# Patient Record
Sex: Female | Born: 1940
Health system: Southern US, Community
[De-identification: ages and names within clinical notes are randomized; demographics above are authoritative.]

## PROBLEM LIST (undated history)

## (undated) DIAGNOSIS — K219 Gastro-esophageal reflux disease without esophagitis: Secondary | ICD-10-CM

## (undated) DIAGNOSIS — Z8739 Personal history of other diseases of the musculoskeletal system and connective tissue: Secondary | ICD-10-CM

## (undated) DIAGNOSIS — M9909 Segmental and somatic dysfunction of abdomen and other regions: Secondary | ICD-10-CM

## (undated) DIAGNOSIS — M1990 Unspecified osteoarthritis, unspecified site: Secondary | ICD-10-CM

## (undated) DIAGNOSIS — E7800 Pure hypercholesterolemia, unspecified: Secondary | ICD-10-CM

## (undated) DIAGNOSIS — I10 Essential (primary) hypertension: Secondary | ICD-10-CM

## (undated) DIAGNOSIS — F32A Depression, unspecified: Secondary | ICD-10-CM

## (undated) DIAGNOSIS — M797 Fibromyalgia: Secondary | ICD-10-CM

## (undated) DIAGNOSIS — E119 Type 2 diabetes mellitus without complications: Secondary | ICD-10-CM

## (undated) DIAGNOSIS — T7840XA Allergy, unspecified, initial encounter: Secondary | ICD-10-CM

## (undated) DIAGNOSIS — N993 Prolapse of vaginal vault after hysterectomy: Secondary | ICD-10-CM

## (undated) DIAGNOSIS — J45909 Unspecified asthma, uncomplicated: Secondary | ICD-10-CM

## (undated) DIAGNOSIS — I2510 Atherosclerotic heart disease of native coronary artery without angina pectoris: Secondary | ICD-10-CM

## (undated) DIAGNOSIS — I872 Venous insufficiency (chronic) (peripheral): Secondary | ICD-10-CM

## (undated) DIAGNOSIS — D573 Sickle-cell trait: Secondary | ICD-10-CM

## (undated) DIAGNOSIS — F329 Major depressive disorder, single episode, unspecified: Secondary | ICD-10-CM

## (undated) DIAGNOSIS — R809 Proteinuria, unspecified: Secondary | ICD-10-CM

## (undated) DIAGNOSIS — R011 Cardiac murmur, unspecified: Secondary | ICD-10-CM

## (undated) DIAGNOSIS — F419 Anxiety disorder, unspecified: Secondary | ICD-10-CM

## (undated) DIAGNOSIS — H5440 Blindness, one eye, unspecified eye: Secondary | ICD-10-CM

## (undated) DIAGNOSIS — K5730 Diverticulosis of large intestine without perforation or abscess without bleeding: Secondary | ICD-10-CM

## (undated) DIAGNOSIS — Z91199 Patient's noncompliance with other medical treatment and regimen due to unspecified reason: Secondary | ICD-10-CM

## (undated) DIAGNOSIS — Z9119 Patient's noncompliance with other medical treatment and regimen: Secondary | ICD-10-CM

## (undated) DIAGNOSIS — I639 Cerebral infarction, unspecified: Secondary | ICD-10-CM

## (undated) HISTORY — DX: Unspecified asthma, uncomplicated: J45.909

## (undated) HISTORY — PX: ABDOMINAL HYSTERECTOMY: SHX81

## (undated) HISTORY — DX: Proteinuria, unspecified: R80.9

## (undated) HISTORY — DX: Sickle-cell trait: D57.3

## (undated) HISTORY — DX: Segmental and somatic dysfunction of abdomen and other regions: M99.09

## (undated) HISTORY — DX: Pure hypercholesterolemia, unspecified: E78.00

## (undated) HISTORY — PX: CARDIAC CATHETERIZATION: SHX172

## (undated) HISTORY — DX: Diverticulosis of large intestine without perforation or abscess without bleeding: K57.30

## (undated) HISTORY — DX: Gastro-esophageal reflux disease without esophagitis: K21.9

## (undated) HISTORY — DX: Anxiety disorder, unspecified: F41.9

## (undated) HISTORY — DX: Patient's noncompliance with other medical treatment and regimen: Z91.19

## (undated) HISTORY — PX: DILATION AND CURETTAGE OF UTERUS: SHX78

## (undated) HISTORY — DX: Essential (primary) hypertension: I10

## (undated) HISTORY — DX: Unspecified osteoarthritis, unspecified site: M19.90

## (undated) HISTORY — DX: Allergy, unspecified, initial encounter: T78.40XA

## (undated) HISTORY — DX: Patient's noncompliance with other medical treatment and regimen due to unspecified reason: Z91.199

## (undated) HISTORY — DX: Fibromyalgia: M79.7

## (undated) HISTORY — DX: Atherosclerotic heart disease of native coronary artery without angina pectoris: I25.10

## (undated) HISTORY — DX: Venous insufficiency (chronic) (peripheral): I87.2

## (undated) HISTORY — DX: Prolapse of vaginal vault after hysterectomy: N99.3

## (undated) HISTORY — PX: CORONARY ANGIOPLASTY: SHX604

---

## 2001-02-14 ENCOUNTER — Encounter: Admission: RE | Admit: 2001-02-14 | Discharge: 2001-03-02 | Payer: Self-pay | Admitting: Orthopedic Surgery

## 2001-11-15 ENCOUNTER — Ambulatory Visit (HOSPITAL_COMMUNITY): Admission: RE | Admit: 2001-11-15 | Discharge: 2001-11-15 | Payer: Self-pay | Admitting: Cardiology

## 2001-11-15 ENCOUNTER — Encounter: Payer: Self-pay | Admitting: Cardiology

## 2001-11-28 ENCOUNTER — Encounter: Payer: Self-pay | Admitting: Cardiology

## 2001-11-28 ENCOUNTER — Ambulatory Visit (HOSPITAL_COMMUNITY): Admission: RE | Admit: 2001-11-28 | Discharge: 2001-11-28 | Payer: Self-pay | Admitting: Cardiology

## 2002-08-01 ENCOUNTER — Other Ambulatory Visit: Admission: RE | Admit: 2002-08-01 | Discharge: 2002-08-01 | Payer: Self-pay | Admitting: Gynecology

## 2003-01-25 ENCOUNTER — Emergency Department (HOSPITAL_COMMUNITY): Admission: EM | Admit: 2003-01-25 | Discharge: 2003-01-25 | Payer: Self-pay | Admitting: Emergency Medicine

## 2003-09-30 ENCOUNTER — Other Ambulatory Visit: Admission: RE | Admit: 2003-09-30 | Discharge: 2003-09-30 | Payer: Self-pay | Admitting: Gynecology

## 2004-05-01 ENCOUNTER — Ambulatory Visit: Payer: Self-pay | Admitting: Pulmonary Disease

## 2004-08-06 ENCOUNTER — Encounter: Admission: RE | Admit: 2004-08-06 | Discharge: 2004-11-04 | Payer: Self-pay | Admitting: Pulmonary Disease

## 2004-10-05 ENCOUNTER — Other Ambulatory Visit: Admission: RE | Admit: 2004-10-05 | Discharge: 2004-10-05 | Payer: Self-pay | Admitting: Gynecology

## 2005-04-14 ENCOUNTER — Emergency Department (HOSPITAL_COMMUNITY): Admission: EM | Admit: 2005-04-14 | Discharge: 2005-04-14 | Payer: Self-pay | Admitting: Family Medicine

## 2005-12-28 ENCOUNTER — Ambulatory Visit: Payer: Self-pay | Admitting: Pulmonary Disease

## 2006-04-04 ENCOUNTER — Ambulatory Visit: Payer: Self-pay | Admitting: Pulmonary Disease

## 2006-06-20 ENCOUNTER — Ambulatory Visit: Payer: Self-pay | Admitting: Pulmonary Disease

## 2006-06-20 LAB — CONVERTED CEMR LAB
Bacteria, U Microscopic: NEGATIVE /hpf
Bilirubin Urine: NEGATIVE
Crystals: NEGATIVE
Ketones, ur: NEGATIVE mg/dL
Leukocytes, UA: NEGATIVE
Mucus, UA: NEGATIVE
Nitrite: NEGATIVE
Specific Gravity, Urine: >=1.03 (ref 1.000–1.03)
Total Protein, Urine: >=300 mg/dL — AB
Urine Glucose: NEGATIVE mg/dL
Urobilinogen, UA: 0.2 (ref 0.0–1.0)
pH: 6 (ref 5.0–8.0)

## 2006-07-06 ENCOUNTER — Ambulatory Visit: Payer: Self-pay | Admitting: Pulmonary Disease

## 2006-07-08 ENCOUNTER — Encounter: Payer: Self-pay | Admitting: Pulmonary Disease

## 2006-07-08 LAB — CONVERTED CEMR LAB: Protein, Ur: 2153 mg/24hr — ABNORMAL HIGH (ref 50–100)

## 2006-08-05 ENCOUNTER — Encounter: Admission: RE | Admit: 2006-08-05 | Discharge: 2006-08-05 | Payer: Self-pay | Admitting: Nephrology

## 2006-08-16 ENCOUNTER — Ambulatory Visit: Payer: Self-pay | Admitting: Pulmonary Disease

## 2006-08-26 ENCOUNTER — Ambulatory Visit: Payer: Self-pay | Admitting: Pulmonary Disease

## 2006-08-26 LAB — CONVERTED CEMR LAB
Albumin: 3.6 g/dL (ref 3.5–5.2)
BUN: 22 mg/dL (ref 6–23)
CO2: 29 meq/L (ref 19–32)
Calcium: 9.2 mg/dL (ref 8.4–10.5)
Chloride: 105 meq/L (ref 96–112)
Cholesterol: 164 mg/dL (ref 0–200)
Creatinine, Ser: 1 mg/dL (ref 0.4–1.2)
GFR calc non Af Amer: 59 mL/min
Hgb A1c MFr Bld: 6.3 % — ABNORMAL HIGH (ref 4.6–6.0)
Phosphorus: 4.2 mg/dL (ref 2.3–4.6)
Total CHOL/HDL Ratio: 3.6

## 2006-10-12 ENCOUNTER — Ambulatory Visit: Payer: Self-pay | Admitting: Pulmonary Disease

## 2006-10-27 ENCOUNTER — Emergency Department (HOSPITAL_COMMUNITY): Admission: EM | Admit: 2006-10-27 | Discharge: 2006-10-27 | Payer: Self-pay | Admitting: Family Medicine

## 2006-11-01 ENCOUNTER — Ambulatory Visit: Payer: Self-pay | Admitting: Pulmonary Disease

## 2006-12-01 ENCOUNTER — Ambulatory Visit: Payer: Self-pay | Admitting: Pulmonary Disease

## 2007-01-13 ENCOUNTER — Ambulatory Visit: Payer: Self-pay | Admitting: Pulmonary Disease

## 2007-05-04 DIAGNOSIS — M797 Fibromyalgia: Secondary | ICD-10-CM | POA: Insufficient documentation

## 2007-05-04 DIAGNOSIS — I872 Venous insufficiency (chronic) (peripheral): Secondary | ICD-10-CM | POA: Insufficient documentation

## 2007-05-04 DIAGNOSIS — I10 Essential (primary) hypertension: Secondary | ICD-10-CM | POA: Insufficient documentation

## 2007-05-04 DIAGNOSIS — K5900 Constipation, unspecified: Secondary | ICD-10-CM | POA: Insufficient documentation

## 2007-05-04 DIAGNOSIS — M1990 Unspecified osteoarthritis, unspecified site: Secondary | ICD-10-CM | POA: Insufficient documentation

## 2007-05-05 ENCOUNTER — Ambulatory Visit: Payer: Self-pay | Admitting: Pulmonary Disease

## 2007-06-07 ENCOUNTER — Telehealth (INDEPENDENT_AMBULATORY_CARE_PROVIDER_SITE_OTHER): Payer: Self-pay | Admitting: *Deleted

## 2007-06-09 ENCOUNTER — Ambulatory Visit: Payer: Self-pay | Admitting: Pulmonary Disease

## 2007-06-09 DIAGNOSIS — J069 Acute upper respiratory infection, unspecified: Secondary | ICD-10-CM | POA: Insufficient documentation

## 2007-06-30 ENCOUNTER — Telehealth: Payer: Self-pay | Admitting: Pulmonary Disease

## 2007-07-07 ENCOUNTER — Telehealth: Payer: Self-pay | Admitting: Pulmonary Disease

## 2007-07-11 ENCOUNTER — Ambulatory Visit: Payer: Self-pay | Admitting: Pulmonary Disease

## 2007-07-11 DIAGNOSIS — Z9119 Patient's noncompliance with other medical treatment and regimen: Secondary | ICD-10-CM | POA: Insufficient documentation

## 2007-07-11 DIAGNOSIS — D573 Sickle-cell trait: Secondary | ICD-10-CM | POA: Insufficient documentation

## 2007-07-11 DIAGNOSIS — Z91199 Patient's noncompliance with other medical treatment and regimen due to unspecified reason: Secondary | ICD-10-CM | POA: Insufficient documentation

## 2007-07-11 DIAGNOSIS — F411 Generalized anxiety disorder: Secondary | ICD-10-CM | POA: Insufficient documentation

## 2007-07-11 DIAGNOSIS — M999 Biomechanical lesion, unspecified: Secondary | ICD-10-CM | POA: Insufficient documentation

## 2007-07-11 DIAGNOSIS — K5730 Diverticulosis of large intestine without perforation or abscess without bleeding: Secondary | ICD-10-CM | POA: Insufficient documentation

## 2007-07-11 DIAGNOSIS — R809 Proteinuria, unspecified: Secondary | ICD-10-CM | POA: Insufficient documentation

## 2007-07-11 DIAGNOSIS — J209 Acute bronchitis, unspecified: Secondary | ICD-10-CM | POA: Insufficient documentation

## 2007-08-30 ENCOUNTER — Encounter: Payer: Self-pay | Admitting: Pulmonary Disease

## 2007-09-15 ENCOUNTER — Telehealth (INDEPENDENT_AMBULATORY_CARE_PROVIDER_SITE_OTHER): Payer: Self-pay | Admitting: *Deleted

## 2007-09-19 ENCOUNTER — Telehealth (INDEPENDENT_AMBULATORY_CARE_PROVIDER_SITE_OTHER): Payer: Self-pay | Admitting: *Deleted

## 2007-11-10 ENCOUNTER — Ambulatory Visit: Payer: Self-pay | Admitting: Pulmonary Disease

## 2008-04-01 ENCOUNTER — Telehealth (INDEPENDENT_AMBULATORY_CARE_PROVIDER_SITE_OTHER): Payer: Self-pay | Admitting: *Deleted

## 2008-04-02 ENCOUNTER — Ambulatory Visit: Payer: Self-pay | Admitting: Internal Medicine

## 2008-04-02 DIAGNOSIS — R42 Dizziness and giddiness: Secondary | ICD-10-CM | POA: Insufficient documentation

## 2008-04-03 ENCOUNTER — Encounter: Payer: Self-pay | Admitting: Pulmonary Disease

## 2008-04-03 ENCOUNTER — Telehealth (INDEPENDENT_AMBULATORY_CARE_PROVIDER_SITE_OTHER): Payer: Self-pay | Admitting: *Deleted

## 2008-04-03 LAB — CONVERTED CEMR LAB
ALT: 19 units/L (ref 0–35)
Albumin: 3.8 g/dL (ref 3.5–5.2)
Alkaline Phosphatase: 56 units/L (ref 39–117)
BUN: 24 mg/dL — ABNORMAL HIGH (ref 6–23)
Bilirubin, Direct: 0.1 mg/dL (ref 0.0–0.3)
CO2: 31 meq/L (ref 19–32)
Calcium: 9 mg/dL (ref 8.4–10.5)
Eosinophils Relative: 3.2 % (ref 0.0–5.0)
Glucose, Bld: 85 mg/dL (ref 70–99)
HCT: 41.3 % (ref 36.0–46.0)
Hemoglobin: 14.4 g/dL (ref 12.0–15.0)
Lymphocytes Relative: 33.2 % (ref 12.0–46.0)
Monocytes Relative: 11.6 % (ref 3.0–12.0)
Neutro Abs: 2.4 10*3/uL (ref 1.4–7.7)
Potassium: 4.2 meq/L (ref 3.5–5.1)
RDW: 15 % — ABNORMAL HIGH (ref 11.5–14.6)
Sodium: 141 meq/L (ref 135–145)
Total Protein: 7.4 g/dL (ref 6.0–8.3)
WBC: 4.5 10*3/uL (ref 4.5–10.5)

## 2008-05-13 ENCOUNTER — Ambulatory Visit: Payer: Self-pay | Admitting: Pulmonary Disease

## 2008-05-23 ENCOUNTER — Telehealth (INDEPENDENT_AMBULATORY_CARE_PROVIDER_SITE_OTHER): Payer: Self-pay | Admitting: *Deleted

## 2008-06-03 ENCOUNTER — Encounter: Payer: Self-pay | Admitting: Pulmonary Disease

## 2008-11-05 ENCOUNTER — Telehealth (INDEPENDENT_AMBULATORY_CARE_PROVIDER_SITE_OTHER): Payer: Self-pay | Admitting: *Deleted

## 2008-11-12 ENCOUNTER — Ambulatory Visit: Payer: Self-pay | Admitting: Pulmonary Disease

## 2008-11-28 ENCOUNTER — Telehealth: Payer: Self-pay | Admitting: Pulmonary Disease

## 2008-12-04 ENCOUNTER — Telehealth (INDEPENDENT_AMBULATORY_CARE_PROVIDER_SITE_OTHER): Payer: Self-pay | Admitting: *Deleted

## 2008-12-06 ENCOUNTER — Telehealth (INDEPENDENT_AMBULATORY_CARE_PROVIDER_SITE_OTHER): Payer: Self-pay | Admitting: *Deleted

## 2008-12-08 ENCOUNTER — Emergency Department (HOSPITAL_COMMUNITY): Admission: EM | Admit: 2008-12-08 | Discharge: 2008-12-08 | Payer: Self-pay | Admitting: Family Medicine

## 2008-12-31 ENCOUNTER — Encounter: Payer: Self-pay | Admitting: Pulmonary Disease

## 2009-01-17 ENCOUNTER — Encounter: Payer: Self-pay | Admitting: Pulmonary Disease

## 2009-01-27 ENCOUNTER — Encounter: Payer: Self-pay | Admitting: Pulmonary Disease

## 2009-02-05 ENCOUNTER — Telehealth: Payer: Self-pay | Admitting: Pulmonary Disease

## 2009-02-14 ENCOUNTER — Encounter: Payer: Self-pay | Admitting: Pulmonary Disease

## 2009-03-07 ENCOUNTER — Encounter: Payer: Self-pay | Admitting: Pulmonary Disease

## 2009-05-21 ENCOUNTER — Ambulatory Visit: Payer: Self-pay | Admitting: Pulmonary Disease

## 2009-05-21 DIAGNOSIS — B029 Zoster without complications: Secondary | ICD-10-CM | POA: Insufficient documentation

## 2009-05-22 DIAGNOSIS — E785 Hyperlipidemia, unspecified: Secondary | ICD-10-CM

## 2009-05-22 DIAGNOSIS — E1169 Type 2 diabetes mellitus with other specified complication: Secondary | ICD-10-CM | POA: Insufficient documentation

## 2009-05-22 DIAGNOSIS — N993 Prolapse of vaginal vault after hysterectomy: Secondary | ICD-10-CM | POA: Insufficient documentation

## 2009-07-04 ENCOUNTER — Telehealth: Payer: Self-pay | Admitting: Pulmonary Disease

## 2009-09-05 ENCOUNTER — Encounter: Payer: Self-pay | Admitting: Pulmonary Disease

## 2009-09-17 ENCOUNTER — Encounter: Payer: Self-pay | Admitting: Pulmonary Disease

## 2009-09-19 HISTORY — PX: ROBOTIC ASSISTED LAPAROSCOPIC SACROCOLPOPEXY: SHX5388

## 2009-09-25 ENCOUNTER — Observation Stay (HOSPITAL_COMMUNITY): Admission: RE | Admit: 2009-09-25 | Discharge: 2009-09-27 | Payer: Self-pay | Admitting: Urology

## 2009-10-14 ENCOUNTER — Encounter: Payer: Self-pay | Admitting: Pulmonary Disease

## 2009-11-19 ENCOUNTER — Ambulatory Visit: Payer: Self-pay | Admitting: Pulmonary Disease

## 2009-11-20 ENCOUNTER — Ambulatory Visit: Payer: Self-pay | Admitting: Pulmonary Disease

## 2009-11-23 LAB — CONVERTED CEMR LAB
Albumin: 4 g/dL (ref 3.5–5.2)
BUN: 29 mg/dL — ABNORMAL HIGH (ref 6–23)
Basophils Relative: 1.4 % (ref 0.0–3.0)
Calcium: 9.7 mg/dL (ref 8.4–10.5)
Eosinophils Relative: 4 % (ref 0.0–5.0)
GFR calc non Af Amer: 64.48 mL/min (ref >60)
Glucose, Bld: 97 mg/dL (ref 70–99)
HCT: 39.4 % (ref 36.0–46.0)
HDL: 45.1 mg/dL (ref >39.00)
Hemoglobin, Urine: NEGATIVE
Hemoglobin: 13.4 g/dL (ref 12.0–15.0)
Hgb A1c MFr Bld: 6.2 % (ref 4.6–6.5)
LDL Cholesterol: 106 mg/dL — ABNORMAL HIGH (ref 0–99)
Lymphs Abs: 1.6 10*3/uL (ref 0.7–4.0)
Monocytes Relative: 13.2 % — ABNORMAL HIGH (ref 3.0–12.0)
Neutro Abs: 2.1 10*3/uL (ref 1.4–7.7)
Platelets: 304 10*3/uL (ref 150.0–400.0)
RBC: 4.52 M/uL (ref 3.87–5.11)
TSH: 0.96 microintl units/mL (ref 0.35–5.50)
Total Bilirubin: 0.5 mg/dL (ref 0.3–1.2)
Total Protein, Urine: NEGATIVE mg/dL
Urine Glucose: NEGATIVE mg/dL
VLDL: 12 mg/dL (ref 0.0–40.0)
WBC: 4.6 10*3/uL (ref 4.5–10.5)

## 2009-11-24 ENCOUNTER — Encounter: Payer: Self-pay | Admitting: Pulmonary Disease

## 2010-01-26 ENCOUNTER — Encounter: Payer: Self-pay | Admitting: Pulmonary Disease

## 2010-03-04 ENCOUNTER — Encounter: Payer: Self-pay | Admitting: Pulmonary Disease

## 2010-05-05 ENCOUNTER — Telehealth (INDEPENDENT_AMBULATORY_CARE_PROVIDER_SITE_OTHER): Payer: Self-pay | Admitting: *Deleted

## 2010-06-21 HISTORY — PX: CATARACT EXTRACTION W/ INTRAOCULAR LENS  IMPLANT, BILATERAL: SHX1307

## 2010-07-10 ENCOUNTER — Encounter: Payer: Self-pay | Admitting: Pulmonary Disease

## 2010-07-23 NOTE — Letter (Signed)
Summary: [facility]  [facility]   Imported By: Sherian Rein 09/30/2009 07:37:00  _____________________________________________________________________  External Attachment:    Type:   Image     Comment:   External Document

## 2010-07-23 NOTE — Letter (Signed)
Summary: [facility]   Imported By: Sherian Rein 11/27/2009 09:32:18  _____________________________________________________________________  External Attachment:    Type:   Image     Comment:   External Document

## 2010-07-23 NOTE — Letter (Signed)
Summary: [facility]   Imported By: Sherian Rein 10/21/2009 12:25:27  _____________________________________________________________________  External Attachment:    Type:   Image     Comment:   External Document

## 2010-07-23 NOTE — Assessment & Plan Note (Signed)
Summary: 6 months/apc   CC:  6 month ROV & review of mult medical problems....  History of Present Illness: 70 y/o BF here for a follow up visit... she has mult med problems as noted...  She has HBP treated by [facility] w/ ATENOLOL 50mg /d and BENICAR/HCT 40-25 daily... states her control has been good... he did her lab work and he wanted her to start CRESTOR which she says she takes only every once in awhile- true to her history of chronic non-compliance w/ medical therapy.  ~  11/09: she was seen 04/02/08 by TP w/ dizziness and given Meclizine for Prn use... she states that it "knocks me out" and she is c/o fatigue/ tired/ malaise- noting in addition "I'm stressed".   ~  Nov 12, 2008:  called recently w/ rash- ?poison ivy per pharm- we called in Medrol & Lidex-E, now improved... she saw "bladder specialist" ie- her GYN, DrRoberts removed pessary and may need surg... she states she's afraid of meds since she has "so many reactions to things" but wants to use Cholester-off for her lipids- I encouraged her to talk w/ DrHarwani & have FLP f/u... c/o constipation and gas...   ~  May 21, 2009:  presents w/ pain left side of buttock x 2-3 weeks w/ "cyst"... exam shows shingles left S2 distrib- mild rash, mild-mod pain by her account... we discussed Rx w/ Acyclovir (for cost reasons) + Medrol Dosepak, and OTC pain meds Prn (all symptoms resolved)...  she notes that her BP has been well controlled- Chol still monitored by [facility], we don't have labs and she doesn't know her numbers...   ~  November 19, 2009:  she is c/o some shoulder & arm pain- eval by Ortho DrHilts ?arthritis ?rotator cuff & she will f/u w/ him... rec> try MOBIC 7.5mg /d in the interim... she had bladder surg> robotic sacrocolpopexy 4/11 by DrMacDiarmid- improved symptomatically... still sees [facility] but wants fasting labs here today & we will send copies.    Current Problem List:  HYPERTENSION (ICD-401.9) - on BENICAR/ HCT 40-25  daily + ATENOLOL 50mg - 1/2 tab/d... Mod HBP that is easy enough to control when she takes her meds regularly... DrHarwani has assumed management of her BP... BP today= 122/74, & denies HA, fatigue, visual changes, CP, palipit, dizziness, syncope, dyspnea, edema, etc...   ~  CXR 1/09 showed clear lungs, sl cardiomegaly w/o CHF...  ~  she continues to have regular check ups w/ DrHarwani...  VENOUS INSUFFICIENCY (ICD-459.81) - on low sodium diet, elevation, support hose & the Hct.  HYPERCHOLESTEROLEMIA, MILD (ICD-272.0 - followed & treated by [facility] on diet alone, we don't have his labs or notes.  ~  FLP here 11/04 showed TChol 214, TG 122, HDL 44, LDL 145  ~  FLP here 3/08 showed TChol 164, TG 59, HDL 45, LDL 107  ~  FLP here 6/11 showed TChol 163, Tg 60, HDL 45, LDL 106... continue diet efforts.  DIABETES MELLITUS, BORDERLINE (ICD-790.29)) - diet controlled... BS all in the 100-110 range when last checked here (we don't have labs from [facility])... hx intol to Metformin...  ~  lab 10/09 showed BS= 85  ~  12/10:  fingerstick BS here = 87  ~  labs here 6/11 showed BS= 97, A1c= 6.2.Marland KitchenMarland Kitchen continue diet Rx.  DIVERTICULOSIS OF COLON (ICD-562.10) - colonoscopy 4/04 by DrBrodie showed divertics, f/u 30yr. CONSTIPATION (ICD-564.00) - we discussed Rx w/ MIRALAX Bid + SENAKOT-S 1-2 Qhs...  PROLAPSE OF VAGINAL VAULT AFTER HYSTERECTOMY (  ICD-618.5) - evals by GYN & Urology- DrKimbrough, MacDiarmid, Borden... she has vault prolapse w/ cystocele, rectocele, enterocele & has failed pessary Rx- s/p robotic sacrocolpopexy 4/11 by DrMacDiarmid & improved.  Hx of PROTEINURIA (ICD-791.0) - she had renal eval DrFox 2/08 w/ impression of prob hypertensive nephrosclerosis... she was encouraged to take her meds regularly to try an acheive adeq BP control...  ~  labs 6/11 showed no proteinuria...  DEGENERATIVE JOINT DISEASE (ICD-715.90) - she takes arthritis Tylenol + Aleve Prn... FIBROMYALGIA (ICD-729.1) SOMATIC  DYSFUNCTION (ICD-739.9) - hx of mult somatic complaints w/ neuro eval by DrSteifel in 1995... nothing found.  ANXIETY (ICD-300.00) - on ALPRAZOLAM 0.5mg - 1/2 - 1 tab Tid Prn...   PERS HX NONCOMPLIANCE W/MED TX (ICD-V15.81) - AS NOTED...  Hx of SICKLE-CELL TRAIT (ICD-282.5) - pt reports + test for sickle trait in the past.  ~  labs 10/09 showed Hg= 14.4  ~  labs 6/11 showed Hg= 13.4, MCV= 87   Allergies: 1)  ! Penicillin 2)  ! * Asprin 3)  ! * Prinvil  Comments:  Nurse/Medical Assistant: The patient's medications and allergies were reviewed with the patient and were updated in the Medication and Allergy Lists.  Past History:  Past Medical History: ASTHMATIC BRONCHITIS, ACUTE (ICD-466.0) UPPER RESPIRATORY INFECTION, ACUTE (ICD-465.9) HYPERTENSION (ICD-401.9) VENOUS INSUFFICIENCY (ICD-459.81) DIABETES MELLITUS, BORDERLINE (ICD-790.29) HYPERCHOLESTEROLEMIA, MILD (ICD-272.0) DIVERTICULOSIS OF COLON (ICD-562.10) CONSTIPATION (ICD-564.00) PROLAPSE OF VAGINAL VAULT AFTER HYSTERECTOMY (ICD-618.5) Hx of PROTEINURIA (ICD-791.0) DEGENERATIVE JOINT DISEASE (ICD-715.90) FIBROMYALGIA (ICD-729.1) SOMATIC DYSFUNCTION (ICD-739.9) ANXIETY (ICD-300.00) PERS HX NONCOMPLIANCE W/MED TX PRS HAZARDS HLTH (ICD-V15.81) Hx of SICKLE-CELL TRAIT (ICD-282.5)  Past Surgical History: S/P hysterectomy S/P robotic sacrocolpopexy 4/11 by DrMacDiarmid  Family History: Reviewed history from 11/10/2007 and no changes required. mother died at age 43 from dementia father died at age 86 from stroke and other complications 2 sibling 1 brother died at age 82 from lung cancer 1 sister alive at age 64 hx of diabetic,bp  Social History: Reviewed history and no changes required.  Review of Systems      See HPI       The patient complains of dyspnea on exertion.  The patient denies anorexia, fever, weight loss, weight gain, vision loss, decreased hearing, hoarseness, chest pain, syncope, peripheral edema,  prolonged cough, headaches, hemoptysis, abdominal pain, melena, hematochezia, severe indigestion/heartburn, hematuria, incontinence, muscle weakness, suspicious skin lesions, transient blindness, difficulty walking, depression, unusual weight change, abnormal bleeding, enlarged lymph nodes, and angioedema.    Vital Signs:  Patient profile:   70 year old female Height:      67 inches Weight:      176 pounds BMI:     27.67 O2 Sat:      97 % on Room air Temp:     98.5 degrees F oral Pulse rate:   73 / minute BP sitting:   122 / 74  (left arm) Cuff size:   regular  Vitals Entered By: Randell Loop CMA (November 19, 2009 10:28 AM)  O2 Sat at Rest %:  97 O2 Flow:  Room air CC: 6 month ROV & review of mult medical problems... Is Patient Diabetic? No Pain Assessment Patient in pain? no      Comments meds updated today   Physical Exam  Additional Exam:  WD, WN, 69 y/o BF in NAD... GENERAL:  Alert & oriented; pleasant & cooperative... she has mult trigger points bilat... HEENT:  [place]/AT, EOM-wnl, PERRLA, EACs-clear, TMs-wnl, NOSE-clear, THROAT-clear & wnl. NECK:  Supple w/  fairROM; no JVD; normal carotid impulses w/o bruits; no thyromegaly or nodules palpated; no lymphadenopathy. CHEST:  few scat rhonchi bilat without rales or consolidation... HEART:  Regular Rhythm; without murmurs/ rubs/ or gallops. ABDOMEN:  Soft & nontender; normal bowel sounds; no organomegaly or masses detected. EXT: without deformities, mild arthritic changes; no varicose veins/ +venous insuffic/ no edema. NEURO:  CN's intact; no focal neuro deficits... DERM:  No lesions noted; no rash etc...    MISC. Report  Procedure date:  11/19/2009  Findings:      BMP (METABOL)   Sodium                    143 mEq/L                   135-145   Potassium                 4.1 mEq/L                   3.5-5.1   Chloride                  106 mEq/L                   96-112   Carbon Dioxide            30 mEq/L                     19-32   Glucose                   97 mg/dL                    16-10   BUN                  [H]  29 mg/dL                    9-60   Creatinine                1.1 mg/dL                   4.5-4.0   Calcium                   9.7 mg/dL                   9.8-11.9   GFR                       64.48 mL/min                >60  Hepatic/Liver Function Panel (HEPATIC)   Total Bilirubin           0.5 mg/dL                   1.4-7.8   Direct Bilirubin          0.1 mg/dL                   2.9-5.6   Alkaline Phosphatase      57 U/L                      39-117   AST  18 U/L                      0-37   ALT                       15 U/L                      0-35   Total Protein             7.4 g/dL                    1.6-1.0   Albumin                   4.0 g/dL                    9.6-0.4  CBC Platelet w/Diff (CBCD)   White Cell Count          4.6 K/uL                    4.5-10.5   Red Cell Count            4.52 Mil/uL                 3.87-5.11   Hemoglobin                13.4 g/dL                   54.0-98.1   Hematocrit                39.4 %                      36.0-46.0   MCV                       87.3 fl                     78.0-100.0   Platelet Count            304.0 K/uL                  150.0-400.0   Neutrophil %              46.4 %                      43.0-77.0   Lymphocyte %              35.0 %                      12.0-46.0   Monocyte %           [H]  13.2 %                      3.0-12.0   Eosinophils%              4.0 %                       0.0-5.0   Basophils %               1.4 %                       0.0-3.0  Comments:      TSH (TSH)   FastTSH                   0.96 uIU/mL                 0.35-5.50  Lipid Panel (LIPID)   Cholesterol               163 mg/dL                   4-696   Triglycerides             60.0 mg/dL                  2.9-528.4   HDL                       13.24 mg/dL                 >40.10   LDL Cholesterol      [H]  272 mg/dL                    5-36                   Hemoglobin A1C (A1C)   Hemoglobin A1C            6.2 %                       4.6-6.5  Sed Rate (ESR)   Sed Rate                  18 mm/hr                    0-22  Vitamin D (25-Hydroxy) (64403)  Vitamin D (25-Hydroxy)                             45 ng/mL                    30-89   Impression & Recommendations:  Problem # 1:  HYPERTENSION (ICD-401.9) Well controlled on meds>  continue same... Her updated medication list for this problem includes:    Atenolol 50 Mg Tabs (Atenolol) .[name]... Take 1/2 tablet by mouth once a day    Benicar Hct 40-25 Mg Tabs (Olmesartan medoxomil-hctz) .[name]... Take 1 tablet by mouth once a day  Orders: TLB-BMP (Basic Metabolic Panel-BMET) (80048-METABOL) TLB-Hepatic/Liver Function Pnl (80076-HEPATIC) TLB-CBC Platelet - w/Differential (85025-CBCD) TLB-TSH (Thyroid Stimulating Hormone) (84443-TSH) TLB-Lipid Panel (80061-LIPID) TLB-A1C / Hgb A1C (Glycohemoglobin) (83036-A1C) TLB-Sedimentation Rate (ESR) (85652-ESR) T-Vitamin D (25-Hydroxy) (47425-95638)  Problem # 2:  HYPERCHOLESTEROLEMIA, MILD (ICD-272.0) She is doing reasonably well on diet alone...  Problem # 3:  DIABETES MELLITUS, BORDERLINE (ICD-790.29) Stable on diet alone...  Problem # 4:  DIVERTICULOSIS OF COLON (ICD-562.10) GI is stable>  continue meds.  Problem # 5:  PROLAPSE OF VAGINAL VAULT AFTER HYSTERECTOMY (ICD-618.5) GU improved after the surg...  Problem # 6:  DEGENERATIVE JOINT DISEASE (ICD-715.90) [facility] the [facility] & she will f/u w/ drHilts... Her updated medication list for this problem includes:    Meloxicam 7.5 Mg Tabs (Meloxicam) .[name]... Take 1 tab by mouth once daily as needed for arthritis pain...  Orders: Orthopedic Referral (Ortho)  Problem # 7:  ANXIETY (ICD-300.00) Continue the Benzo Rx... Her updated medication list for this  problem includes:    Alprazolam 0.5 Mg Tbdp (Alprazolam) .[name]... Take 1/2 to 1 tab by mouth three times a day as needed for  nerves...  Complete Medication List: 1)  [facility]vs Saline Nasal Spray 0.65 % Soln (Saline) .... As needed 2)  Mucinex Dm 30-600 Mg Tb12 (Dextromethorphan-guaifenesin) .[name].. 1-2 two times a day as needed 3)  Atenolol 50 Mg Tabs (Atenolol) .... Take 1/2 tablet by mouth once a day 4)  Benicar Hct 40-25 Mg Tabs (Olmesartan medoxomil-hctz) .... Take 1 tablet by mouth once a day 5)  Premarin 0.625 Mg/gm Crea (Estrogens, conjugated) .... Use 2 times per week 6)  Calcium 600 1500 Mg Tabs (Calcium carbonate) .... Take 1 tablet by mouth once a day 7)  Multivitamins Tabs (Multiple vitamin) .... Take 1 tablet by mouth once a day 8)  Alprazolam 0.5 Mg Tbdp (Alprazolam) .... Take 1/2 to 1 tab by mouth three times a day as needed for nerves.Marland KitchenMarland Kitchen 9)  Meloxicam 7.5 Mg Tabs (Meloxicam) .... Take 1 tab by mouth once daily as needed for arthritis pain... 10)  Miralax Powd (Polyethylene glycol 3350) .[name].. 1 capful in water twice daily... 11)  Senokot S 8.6-50 Mg Tabs (Sennosides-docusate sodium) .... Take 2 tabs by mouth at bedtime...  Patient Instructions: 1)  Today we updated your med list- see below.... 2)  We wrote a new perscription for your ALPRAZOLAM & for generic MOBIC to take as needed for your arthritis pain.Marland KitchenMarland Kitchen 3)  Today we did your follow up FASTING blood work... please call the "phone tree" in a few days for your lab results.Marland KitchenMarland Kitchen 4)  We will set up an appt w/ DrHilts, Ortho- to check your left shoulder... 5)  Call for any questions.Marland KitchenMarland Kitchen 6)  Please schedule a follow-up appointment in 6 months. Prescriptions: MELOXICAM 7.5 MG TABS (MELOXICAM) take 1 tab by mouth once daily as needed for arthritis pain...  #30 x 6   Entered and Authorized by:   Michele Mcalpine MD   Signed by:   Michele Mcalpine MD on 11/19/2009   Method used:   Print then Give to Patient   RxID:   [id number] ALPRAZOLAM 0.5 MG  TBDP (ALPRAZOLAM) take 1/2 to 1 tab by mouth three times a day as needed for nerves...  #90 x 6   Entered and Authorized  by:   Michele Mcalpine MD   Signed by:   Michele Mcalpine MD on 11/19/2009   Method used:   Print then Give to Patient   RxID:   [id number]

## 2010-07-23 NOTE — Letter (Signed)
Summary: [facility]  [facility]   Imported By: Lester [place] 03/10/2010 09:11:07  _____________________________________________________________________  External Attachment:    Type:   Image     Comment:   External Document

## 2010-07-23 NOTE — Letter (Signed)
Summary: [facility]   Imported By: Sherian Rein 09/17/2009 13:39:49  _____________________________________________________________________  External Attachment:    Type:   Image     Comment:   External Document

## 2010-07-23 NOTE — Progress Notes (Signed)
Summary: rx  Phone Note Call from Patient Call back at Home Phone [phone]   Caller: Patient Call For: Kayla [name] Reason for Call: Acute Illness, Talk to Nurse Summary of Call: sinus infection, drainage, lots of plegm, eyes matted shut with green mucous.  Can you call something in? [facility] [place] Initial call taken by: Eugene Gavia,  July 04, 2009 2:34 PM  Follow-up for Phone Call        called, spoke with pt. Pt c/o head congestion and drainage x1 wk and "dark" mucus x1 day.  denies fever.  requesting something to be called in.  WIll forward to SN-please advise.  Thanks! allergies-pcn, aspirin, prinvil [facility] [place] Follow-up by: Gweneth Dimitri RN,  July 04, 2009 2:51 PM  Additional Follow-up for Phone Call Additional follow up Details #1::        per SN---ok for pt to have zpak   #1  take as directed.  thanks Randell Loop CMA  July 04, 2009 4:47 PM   rx sent. pt aware. Carron Curie CMA  July 04, 2009 4:48 PM     New/Updated Medications: ZITHROMAX Z-PAK 250 MG TABS (AZITHROMYCIN) as directed Prescriptions: ZITHROMAX Z-PAK 250 MG TABS (AZITHROMYCIN) as directed  #1 pak x 0   Entered by:   Carron Curie CMA   Authorized by:   Michele Mcalpine MD   Signed by:   Carron Curie CMA on 07/04/2009   Method used:   Electronically to        [facility] [address] (retail)       [address]       [place], [place]  [address]       Ph: [phone]       Fax: [phone]   RxID:   [id number]

## 2010-07-23 NOTE — Letter (Signed)
Summary: [facility]   Imported By: Sherian Rein 02/02/2010 14:08:39  _____________________________________________________________________  External Attachment:    Type:   Image     Comment:   External Document

## 2010-07-23 NOTE — Progress Notes (Signed)
Summary: sinus infection-LMTCBx2  Phone Note Call from Patient Call back at Home Phone [phone]   Caller: Patient Call For: nadel Reason for Call: Talk to Nurse Summary of Call: patient calling stating she has a sinus infection, she has tried mucinex with no relief.  Requesting abx.  [facility] Initial call taken by: Lehman Prom,  May 05, 2010 9:08 AM  Follow-up for Phone Call        [facility] x1. Abigail Miyamoto RN  May 05, 2010 9:39 AM   LMTCBx2. Carron Curie CMA  May 06, 2010 11:35 AM   Additional Follow-up for Phone Call Additional follow up Details #1::        per family member pt does not need anything from [place] at this time will call back if needed Additional Follow-up by: Philipp Deputy CMA,  May 07, 2010 10:59 AM

## 2010-07-29 NOTE — Letter (Signed)
Summary: Alfredo Martinez [facility]  Alfredo [facility]   Imported By: Lester [place] 07/21/2010 10:36:14  _____________________________________________________________________  External Attachment:    Type:   Image     Comment:   External Document

## 2010-08-25 ENCOUNTER — Other Ambulatory Visit: Payer: Self-pay | Admitting: Pulmonary Disease

## 2010-08-25 ENCOUNTER — Other Ambulatory Visit: Payer: Medicare Other

## 2010-08-25 ENCOUNTER — Ambulatory Visit (INDEPENDENT_AMBULATORY_CARE_PROVIDER_SITE_OTHER): Payer: Medicare Other | Admitting: Pulmonary Disease

## 2010-08-25 ENCOUNTER — Encounter: Payer: Self-pay | Admitting: Pulmonary Disease

## 2010-08-25 DIAGNOSIS — B029 Zoster without complications: Secondary | ICD-10-CM

## 2010-08-25 DIAGNOSIS — I10 Essential (primary) hypertension: Secondary | ICD-10-CM

## 2010-08-25 DIAGNOSIS — I872 Venous insufficiency (chronic) (peripheral): Secondary | ICD-10-CM

## 2010-08-25 DIAGNOSIS — IMO0001 Reserved for inherently not codable concepts without codable children: Secondary | ICD-10-CM

## 2010-08-25 DIAGNOSIS — K5730 Diverticulosis of large intestine without perforation or abscess without bleeding: Secondary | ICD-10-CM

## 2010-08-25 DIAGNOSIS — M79609 Pain in unspecified limb: Secondary | ICD-10-CM | POA: Insufficient documentation

## 2010-08-25 DIAGNOSIS — N390 Urinary tract infection, site not specified: Secondary | ICD-10-CM

## 2010-08-25 DIAGNOSIS — D573 Sickle-cell trait: Secondary | ICD-10-CM

## 2010-08-25 DIAGNOSIS — K5900 Constipation, unspecified: Secondary | ICD-10-CM

## 2010-08-25 DIAGNOSIS — R7309 Other abnormal glucose: Secondary | ICD-10-CM

## 2010-08-25 DIAGNOSIS — E7800 Pure hypercholesterolemia, unspecified: Secondary | ICD-10-CM

## 2010-08-25 LAB — BASIC METABOLIC PANEL
Calcium: 9.3 mg/dL (ref 8.4–10.5)
Chloride: 105 mEq/L (ref 96–112)
Creatinine, Ser: 1 mg/dL (ref 0.4–1.2)

## 2010-08-25 LAB — URINALYSIS, ROUTINE W REFLEX MICROSCOPIC
Specific Gravity, Urine: 1.02 (ref 1.000–1.030)
Urine Glucose: NEGATIVE

## 2010-08-25 LAB — CBC WITH DIFFERENTIAL/PLATELET
Basophils Relative: 1.4 % (ref 0.0–3.0)
Eosinophils Relative: 2.8 % (ref 0.0–5.0)
Lymphocytes Relative: 24.6 % (ref 12.0–46.0)
Monocytes Relative: 10.3 % (ref 3.0–12.0)
Neutrophils Relative %: 60.9 % (ref 43.0–77.0)
RBC: 4.8 Mil/uL (ref 3.87–5.11)
WBC: 6.4 10*3/uL (ref 4.5–10.5)

## 2010-08-31 ENCOUNTER — Other Ambulatory Visit: Payer: Self-pay | Admitting: Family Medicine

## 2010-08-31 ENCOUNTER — Telehealth (INDEPENDENT_AMBULATORY_CARE_PROVIDER_SITE_OTHER): Payer: Self-pay | Admitting: *Deleted

## 2010-08-31 DIAGNOSIS — M545 Low back pain, unspecified: Secondary | ICD-10-CM

## 2010-09-04 ENCOUNTER — Ambulatory Visit
Admission: RE | Admit: 2010-09-04 | Discharge: 2010-09-04 | Disposition: A | Discharge status: Home / Self Care | Payer: Medicare Other | Source: Ambulatory Visit | Attending: Family Medicine | Admitting: Family Medicine

## 2010-09-04 DIAGNOSIS — M545 Low back pain, unspecified: Secondary | ICD-10-CM

## 2010-09-08 NOTE — Progress Notes (Signed)
Summary: Reaction to acyclovir  Phone Note Call from Patient Call back at Home Phone [phone]   Caller: Patient Call For: Dr Kriste Basque Summary of Call: Patient has only been able to take acyclovir 800 mg 3 times/day, as opposed to prescribed 4 times/day, because medication makes her hoarse and uncomfortable when she swallows.  Please call to advise. Initial call taken by: Leonette Monarch,  August 31, 2010 12:15 PM  Follow-up for Phone Call        Spoke with pt.  She is c/o feeling "uncomfortable when swallowing".  Also c/o feeling "drained of energy" and hoarsness.  She states that she thinks that symptoms are coming from acyclovir and so she has only been taking med 3 times per day.  She states that her "outbreak" is starting to dry up now and wants to know of she has to finish the rest of the rx.  She also wanted to let SN know that she is keeps waking up at night and relates this to taking sertaline and so she has stopped this.  Pls advise thanks Follow-up by: Vernie Murders,  August 31, 2010 12:21 PM  Additional Follow-up for Phone Call Additional follow up Details #1::        per SN---she can change dosing if she wants----take 1/2 tab--use a pill cutter----four times daily----this dose is ok for her.  thanks Randell Loop CMA  August 31, 2010 2:03 PM     Additional Follow-up for Phone Call Additional follow up Details #2::    Spoke with pt and notified of the above recs per SN.  Pt verbalized understanding.  Follow-up by: Vernie Murders,  August 31, 2010 2:10 PM

## 2010-09-08 NOTE — Assessment & Plan Note (Signed)
Summary: 6  MONTH ROV/POSSIBLE UTI//SH   CC:  9 month ROV & review of mult medical problems....  History of Present Illness: 70 y/o BF here for a follow up visit... she has mult med problems as noted below...  She has HBP treated by [facility] w/ ATENOLOL 50mg /d and BENICAR/HCT 40-25 daily... states her control has been good... he did her lab work and he wanted her to start CRESTOR which she says she takes only every once in awhile- true to her history of chronic non-compliance w/ medical therapy & she states she's afraid of meds since she has "so many reactions to things"...   ~  May 21, 2009:  presents w/ pain left side of buttock x 2-3 weeks w/ "cyst"... exam shows "shingles" left S2 distrib- mild rash, mild-mod pain by her account... we discussed Rx w/ Acyclovir + Medrol Dosepak, and OTC pain meds Prn (all symptoms resolved)...  she notes that her BP has been well controlled- Chol still monitored by [facility], we don't have labs and she doesn't know her numbers...   ~  November 19, 2009:  she is c/o some shoulder & arm pain- eval by Ortho DrHilts ?arthritis ?rotator cuff & she will f/u w/ him... rec> try MOBIC 7.5mg /d in the interim... she had bladder surg> robotic sacrocolpopexy 4/11 by DrMacDiarmid- improved symptomatically... still sees [facility] but wants fasting labs here today & we will send copies.   ~  August 25, 2010:  2mo ROV & she has mult somatic complaints> ?UTI w/ dysuria & followed by DrMacDiarmid for Urology (UA=neg, C&S- no growth);  LBP & esp right leg discomfort- hard for her to describe- x several months, we will rx w/ Tramadol & she will f/u w/ Ortho DrHilts;  notes "lots of food allergies- strawberry, oranges, tomatos, nuts, fish" w/ rash & puffy eyes (I offered RAST testing but she's not interested);  recurrent "shingles" rash on left side of buttock x 3-4d (we did HSV testing and results pos for 1&2, treated w/ Acyclovir);  she has Alprax for nerves but feels depressed & we  rec trial Sertraline 50mg /d...   Current Problem List:  HYPERTENSION (ICD-401.9) - on BENICAR/ HCT 40-25 daily + ATENOLOL 50mg - 1/2 tab/d... Mod HBP that is easy enough to control when she takes her meds regularly... DrHarwani has assumed management of her BP... BP today= 136/80, & denies HA, fatigue, visual changes, CP, palipit, dizziness, syncope, dyspnea, edema, etc...   ~  CXR 1/09 showed clear lungs, sl cardiomegaly w/o CHF...  ~  she continues to have regular check ups w/ DrHarwani...  VENOUS INSUFFICIENCY (ICD-459.81) - on low sodium diet, elevation, support hose & the Hct.  HYPERCHOLESTEROLEMIA, MILD (ICD-272.0 - followed & treated by [facility] on diet alone, we don't have his labs or notes.  ~  FLP here 11/04 showed TChol 214, TG 122, HDL 44, LDL 145  ~  FLP here 3/08 showed TChol 164, TG 59, HDL 45, LDL 107  ~  FLP here 6/11 showed TChol 163, Tg 60, HDL 45, LDL 106... continue diet efforts.  DIABETES MELLITUS, BORDERLINE (ICD-790.29)) - diet controlled... BS all in the 100-110 range when last checked here (we don't have labs from [facility])... hx intol to Metformin in past...  ~  lab 10/09 showed BS= 85  ~  12/10:  fingerstick BS here = 87  ~  labs here 6/11 showed BS= 97, A1c= 6.2.Marland KitchenMarland Kitchen continue diet Rx.  DIVERTICULOSIS OF COLON (ICD-562.10) - colonoscopy 4/04 by DrBrodie  showed divertics, f/u 101yr. CONSTIPATION (ICD-564.00) - we discussed Rx w/ MIRALAX Bid + SENAKOT-S 1-2 Qhs...  PROLAPSE OF VAGINAL VAULT AFTER HYSTERECTOMY (ICD-618.5) - evals by GYN & Urology- DrKimbrough, MacDiarmid, Borden... she has vault prolapse w/ cystocele, rectocele, enterocele & has failed pessary Rx- s/p robotic sacrocolpopexy 4/11 by DrMacDiarmid & improved.  Hx of PROTEINURIA (ICD-791.0) - she had renal eval DrFox 2/08 w/ impression of prob hypertensive nephrosclerosis... she was encouraged to take her meds regularly to try an acheive adeq BP control...  ~  labs 6/11 showed no proteinuria...  DEGENERATIVE  JOINT DISEASE (ICD-715.90) - she takes arthritis Tylenol + Aleve Prn... FIBROMYALGIA (ICD-729.1) SOMATIC DYSFUNCTION (ICD-739.9) - hx of mult somatic complaints w/ neuro eval by DrSteifel in 1995... nothing found.  ANXIETY (ICD-300.00) - on ALPRAZOLAM 0.5mg - 1/2 - 1 tab Tid Prn...   PERS HX NONCOMPLIANCE W/MED TX (ICD-V15.81) - AS NOTED...  Hx of SICKLE-CELL TRAIT (ICD-282.5) - pt reports + test for sickle trait in the past.  ~  labs 10/09 showed Hg= 14.4  ~  labs 6/11 showed Hg= 13.4, MCV= 87   Preventive Screening-Counseling & Management  Alcohol-Tobacco     Smoking Status: never  Allergies: 1)  ! Penicillin 2)  ! * Asprin 3)  ! * Prinvil  Comments:  Nurse/Medical Assistant: The patient's medications and allergies were reviewed with the patient and were updated in the Medication and Allergy Lists.  Past History:  Past Medical History: ASTHMATIC BRONCHITIS, ACUTE (ICD-466.0) UPPER RESPIRATORY INFECTION, ACUTE (ICD-465.9) HYPERTENSION (ICD-401.9) VENOUS INSUFFICIENCY (ICD-459.81) DIABETES MELLITUS, BORDERLINE (ICD-790.29) HYPERCHOLESTEROLEMIA, MILD (ICD-272.0) DIVERTICULOSIS OF COLON (ICD-562.10) CONSTIPATION (ICD-564.00) PROLAPSE OF VAGINAL VAULT AFTER HYSTERECTOMY (ICD-618.5) Hx of PROTEINURIA (ICD-791.0) DEGENERATIVE JOINT DISEASE (ICD-715.90) FIBROMYALGIA (ICD-729.1) SOMATIC DYSFUNCTION (ICD-739.9) ANXIETY (ICD-300.00) PERS HX NONCOMPLIANCE W/MED TX PRS HAZARDS HLTH (ICD-V15.81) Hx of SICKLE-CELL TRAIT (ICD-282.5)  Past Surgical History: S/P hysterectomy S/P robotic sacrocolpopexy 4/11 by DrMacDiarmid  Family History: Reviewed history from 11/10/2007 and no changes required. mother died at age 20 from dementia father died at age 52 from stroke and other complications 2 sibling 1 brother died at age 16 from lung cancer 1 sister alive at age 81 hx of diabetic, hbp  Social History: Reviewed history and no changes required.  Review of Systems      See  HPI       The patient complains of decreased hearing, dyspnea on exertion, muscle weakness, difficulty walking, and depression.  The patient denies anorexia, fever, weight loss, weight gain, vision loss, hoarseness, chest pain, syncope, peripheral edema, prolonged cough, headaches, hemoptysis, abdominal pain, melena, hematochezia, severe indigestion/heartburn, hematuria, incontinence, suspicious skin lesions, transient blindness, unusual weight change, abnormal bleeding, enlarged lymph nodes, and angioedema.    Vital Signs:  Patient profile:   70 year old female Height:      67 inches Weight:      181.38 pounds BMI:     28.51 O2 Sat:      98 % on Room air Temp:     97.5 degrees F oral Pulse rate:   70 / minute BP sitting:   136 / 80  (right arm) Cuff size:   regular  Vitals Entered By: Randell Loop CMA (August 25, 2010 1:45 PM)  O2 Sat at Rest %:  98 O2 Flow:  Room air CC: 9 month ROV & review of mult medical problems... Is Patient Diabetic? No Pain Assessment Patient in pain? yes      Onset of pain  back, right hip and  down into right leg pain Comments meds updated today with pt   Physical Exam  Additional Exam:  WD, WN, 69 y/o BF in NAD... GENERAL:  Alert & oriented; pleasant & cooperative... she has mult trigger points bilat... HEENT:  [place]/AT, EOM-wnl, PERRLA, EACs-clear, TMs-wnl, NOSE-clear, THROAT-clear & wnl. NECK:  Supple w/ fairROM; no JVD; normal carotid impulses w/o bruits; no thyromegaly or nodules palpated; no lymphadenopathy. CHEST:  few scat rhonchi bilat without rales or consolidation... HEART:  Regular Rhythm; without murmurs/ rubs/ or gallops. ABDOMEN:  Soft & nontender; normal bowel sounds; no organomegaly or masses detected. EXT: without deformities, mild arthritic changes; no varicose veins/ +venous insuffic/ no edema. NEURO:  CN's intact; no focal neuro deficits... DERM:  No lesions noted; no rash etc...    MISC. Report  Procedure date:   08/25/2010  Findings:      DATA REVIEWED:  ~  Labs here 08/25/10 reviewed & pt notified by 'phone tree'  ~  Office notes from DrMacDiarmid, last 1/12- reviewed...   Impression & Recommendations:  Problem # 1:  HYPERTENSION (ICD-401.9) Treated by [facility] & we don't have his notes, but she is encouraged to continue f/u & current meds. Her updated medication list for this problem includes:    Atenolol 50 Mg Tabs (Atenolol) .[name]... Take 1/2 tablet by mouth once a day    Benicar Hct 40-25 Mg Tabs (Olmesartan medoxomil-hctz) .[name]... Take 1 tablet by mouth once a day  Orders: T-Urine Culture (Spectrum Order) [id number]) T- * Misc. Laboratory test [phone]) TLB-BMP (Basic Metabolic Panel-BMET) (80048-METABOL) TLB-CBC Platelet - w/Differential (85025-CBCD) TLB-Udip w/ Micro (81001-URINE)  Problem # 2:  HYPERCHOLESTEROLEMIA, MILD (ICD-272.0) On diet alone & monitored regularly by [facility]... she is encouraged to continue diet Rx & follow up w/ him regularly.  Problem # 3:  DIABETES MELLITUS, BORDERLINE (ICD-790.29) BSs are normal on diet alone...  Problem # 4:  DIVERTICULOSIS OF COLON (ICD-562.10) Followed by DrDBrodie & repeat colon due 4/14...  Problem # 5:  PROLAPSE OF VAGINAL VAULT AFTER HYSTERECTOMY (ICD-618.5) Followed & managed by DrMacDiarmid... UA and C&S are neg...  Problem # 6:  DEGENERATIVE JOINT DISEASE (ICD-715.90) She has DJD & FM w/ mult somatic complaints... she is rec to try Tramadol for relief & f/u w/ her Ortho... The following medications were removed from the medication list:    Meloxicam 7.5 Mg Tabs (Meloxicam) .[name]... Take 1 tab by mouth once daily as needed for arthritis pain... Her updated medication list for this problem includes:    Tramadol Hcl 50 Mg Tabs (Tramadol hcl) .[name]... Take 1 tab by mouth every 6h as needed for pain...  Orders: Orthopedic Referral (Ortho)  Problem # 7:  SOMATIC DYSFUNCTION (ICD-739.9) She has mod anxiety & uses Alpraz Prn... c/o  depression & we rec trial Zoloft 50mg  Qhs...  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 treated for her "shingles-like virus" w/ Acyclovir orally...  Complete Medication List: 1)  Mucinex Dm 30-600 Mg Tb12 (Dextromethorphan-guaifenesin) .[name].. 1-2 two times a day as needed 2)  Atenolol 50 Mg Tabs (Atenolol) .... Take 1/2 tablet by mouth once a day 3)  Benicar Hct 40-25 Mg Tabs (Olmesartan medoxomil-hctz) .... Take 1 tablet by mouth once a day 4)  Multivitamins Tabs (Multiple vitamin) .... Take 1 tablet by mouth once a day 5)  Alprazolam 0.5 Mg Tbdp (Alprazolam) .... Take 1/2 to 1 tab by mouth three times a day as needed for nerves... 6)  Sertraline Hcl 50 Mg Tabs (Sertraline hcl) .... Take 1 tab by mouth at bedtime.Marland KitchenMarland Kitchen 7)  Tramadol Hcl 50 Mg Tabs (Tramadol hcl) .... Take 1 tab by mouth every 6h as needed for pain.Marland KitchenMarland Kitchen 8)  Acyclovir 800 Mg Tabs (Acyclovir) .... Take 1 tab by mouth 4 times daily til gone...  Patient Instructions: 1)  Today we updated your med list- see below.... 2)  We decided to start TRAMADOL every 6H as needed for pain & refer you to DrHilts for XRays & poss MRI for evaluation of your leg pain.Marland KitchenMarland Kitchen 3)  We also wrote a new perscription for SERTRALINE to take one tab at bedtime for your depression... we may need to increase this med so let's plan a follow up visit in 6 weeks to assess your response... 4)  Today we did some blood & urine tests> please call the "phone tree" in a few days for your lab results.Marland KitchenMarland Kitchen  5)  Please start on the anti-viral antibiotic (ACYCLOVIR- one tab 4 times daily x 5d) for your outbreak on your buttocks & we will check for the shingles-like virus in your system... 6)  Finally we will check for an Urinary infection & let you know the results... Prescriptions: ACYCLOVIR 800 MG TABS  (ACYCLOVIR) take 1 tab by mouth 4 times daily til gone...  #20 x 1   Entered and Authorized by:   Michele Mcalpine MD   Signed by:   Michele Mcalpine MD on 08/25/2010   Method used:   Print then Give to Patient   RxID:   [id number] TRAMADOL HCL 50 MG TABS (TRAMADOL HCL) take 1 tab by mouth every 6H as needed for pain...  #50 x 6   Entered and Authorized by:   Michele Mcalpine MD   Signed by:   Michele Mcalpine MD on 08/25/2010   Method used:   Print then Give to Patient   RxID:   [id number] SERTRALINE HCL 50 MG TABS (SERTRALINE HCL) take 1 tab by mouth at bedtime...  #30 x 6   Entered and Authorized by:   Michele Mcalpine MD   Signed by:   Michele Mcalpine MD on 08/25/2010   Method used:   Print then Give to Patient   RxID:   [id number]    Immunization History:  Influenza Immunization History:    Influenza:  historical (04/08/2010)  Pneumovax Immunization History:    Pneumovax:  historical (08/31/2007)

## 2010-09-09 LAB — BASIC METABOLIC PANEL
CO2: 29 mEq/L (ref 19–32)
Calcium: 9.1 mg/dL (ref 8.4–10.5)
Chloride: 106 mEq/L (ref 96–112)
GFR calc Af Amer: 51 mL/min — ABNORMAL LOW (ref >60)
Sodium: 141 mEq/L (ref 135–145)

## 2010-09-09 LAB — GLUCOSE, CAPILLARY
Glucose-Capillary: 101 mg/dL — ABNORMAL HIGH (ref 70–99)
Glucose-Capillary: 111 mg/dL — ABNORMAL HIGH (ref 70–99)
Glucose-Capillary: 115 mg/dL — ABNORMAL HIGH (ref 70–99)
Glucose-Capillary: 91 mg/dL (ref 70–99)
Glucose-Capillary: 97 mg/dL (ref 70–99)

## 2010-09-09 LAB — TYPE AND SCREEN: Antibody Screen: POSITIVE

## 2010-09-09 LAB — HEMOGLOBIN AND HEMATOCRIT, BLOOD
Hemoglobin: 11.7 g/dL — ABNORMAL LOW (ref 12.0–15.0)
Hemoglobin: 12.8 g/dL (ref 12.0–15.0)

## 2010-09-09 LAB — CBC
Hemoglobin: 13.3 g/dL (ref 12.0–15.0)
MCHC: 33.2 g/dL (ref 30.0–36.0)
RBC: 4.48 MIL/uL (ref 3.87–5.11)
WBC: 3.9 10*3/uL — ABNORMAL LOW (ref 4.0–10.5)

## 2010-09-28 LAB — POCT URINALYSIS DIP (DEVICE)
Hgb urine dipstick: NEGATIVE
Protein, ur: NEGATIVE mg/dL
Specific Gravity, Urine: 1.01 (ref 1.005–1.030)
Urobilinogen, UA: 0.2 mg/dL (ref 0.0–1.0)

## 2010-09-28 LAB — URINE CULTURE

## 2010-10-09 ENCOUNTER — Encounter: Payer: Self-pay | Admitting: Pulmonary Disease

## 2010-10-28 ENCOUNTER — Telehealth: Payer: Self-pay | Admitting: Pulmonary Disease

## 2010-10-28 NOTE — Telephone Encounter (Signed)
Pt aware we will send copy of March 2012 labs to Dr. Sharyn Lull and we do not have any record of the shingles vaccine.

## 2010-10-28 NOTE — Telephone Encounter (Signed)
ALSO PT WOULD LIKE CALL BACK SHE WANTS TO KNOW IF SHE HAS HAD THE SHINGLE VACCINE.Kayla [name]

## 2010-11-06 NOTE — Cardiovascular Report (Signed)
[place]  Patient:    Kayla [name], Kayla [name] Visit Number: [id number] MRN: [id number]          Service Type: CAT Location: [facility] 2892 [id number] Attending Physician:  Robynn Pane Dictated by:   Eduardo Osier Sharyn Lull, M.D. Proc. Date: 11/28/01 Admit Date:  11/28/2001 Discharge Date: 11/28/2001   CC:         Cardiac Catheterization Laboratory  Osvaldo Shipper. Spruill, M.D.   Cardiac Catheterization  PROCEDURE: Left cardiac catheterization with selective left and right coronary angiography, left ventriculography via right groin using Judkins technique.  INDICATIONS FOR PROCEDURE: The patient is a 70 year old black female with a past medical history significant for hypertension, history of bronchitis, arthritis, complains of retrosternal chest tightness off and on and neck pain without associated symptoms. Denies any nausea, vomiting or diaphoresis. Denies PND, orthopnea, or leg swelling. Denies palpitations, lightheadedness or syncope. The patient does give history of exertional dyspnea with minimal exertion and feeling weak and tired. The patient states she is under a lot of stress as she has to take care of her mother. The patient underwent Persantine Cardiolite on May 28, which showed small area of mild ischemia in the anteroseptal region with an EF of 64%.  PAST MEDICAL HISTORY: As above.  PAST SURGICAL HISTORY: She had partial hysterectomy in 1976, bilateral breast cyst resection in 1970s. She had exploratory laparotomy in 1980s.  ALLERGIES: She is allergic to PENICILLIN, she gets rash.  HOME MEDICATIONS: 1. She was taking Diovan 160 mg p.o. q.d. 2. Atenolol 50 mg p.o. q.d. 3. Nasacort AQ. 4. Allegra. 5. Enteric-coated aspirin. 325 mg p.o. q.d. 6. Nitrostat sublingual p.r.n.  SOCIAL HISTORY: She is married and has four children. No history of smoking or alcohol abuse. 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  FAMILY HISTORY: Mother is alive, she is 24.  She has Alzheimers disease. One sister died of stroke. She was hypertensive at the age of 20. One brother died of lung cancer. One sister is alive. She is diabetic, hypertensive, and has osteoarthritis.  PHYSICAL EXAMINATION:  GENERAL: On examination, she was awake, alert and oriented x3 in no acute distress.  VITAL SIGNS: Blood pressure is 124/76, pulse 62, regular.  HEENT: Conjunctiva pink.  NECK: Supple, no JVD, no bruit.  LUNGS: Lungs are clear to auscultation without rhonchi or rales.  CARDIOVASCULAR: S1 and S2 are normal. There was no S3 gallop. There was soft systolic murmur at the apex.  ABDOMEN: Soft, bowel sounds are present. Nontender.  EXTREMITIES: There was no clubbing, cyanosis or edema.  IMPRESSION: New onset angina. Positive Persantine Cardiolite, hypertension, history of bronchitis, arthritis.  Discussed at length with patient regarding left catheterization, possible PTCA and stenting, its risks, i.e., death, MI, stroke, need for emergency CABG, risk of restenosis, local vascular complications, etc., and consented for PCI.  DESCRIPTION OF PROCEDURE: After obtaining the informed consent, the patient was brought to the catheterization lab and was placed on the fluoroscopy table.  The right groin was prepped and draped in the usual fashion. Xylocaine 2% was used for local anesthesia in the right groin. With the help of a thin-walled needle a 6 French arterial sheath was placed. The sheath was aspirated and flushed. Next, a 6 French left Judkins catheter was advanced over the wire under fluoroscopic guidance up to the ascending aorta. The wire was pulled out, the catheter was aspirated and connected to the manifold. The  catheter was further advanced and engaged into the left coronary ostium. Multiple views of the left system were taken. Next, the catheter was disengaged and was pulled out over the wire and  was replaced with a 6 French right Judkins catheter, which was advanced over the wire under fluoroscopic guidance up to the ascending aorta. The wire was pulled out, the catheter was further advanced and engaged into the right coronary ostium. Multiple views of the right system were taken. Next, the catheter was disengaged and was pulled out over the wire and was replaced with a 6 French pigtail catheter which was advanced over the wire under fluoroscopic guidance up to the ascending aorta. The wire was pulled out, the catheter was aspirated and connected to the manifold. The catheter was further advanced across the aortic valve into the LV. LV pressures were recorded. Next, left ventriculography was done in 30-degree RAO position. Post angiographic pressures were recorded from LV and then pullback pressures were recorded from the aorta. There was no gradient across the aortic valve. Next, the pigtail catheter was pulled out over the wire. Sheaths were aspirated and flushed.  FINDINGS: LV showed good LV systolic function. EF of 55-60%.  Left main was patent.  LAD has 15-20% proximal and distal stenosis. Diagonal #1 was medium sized which was patent.  The left circumflex was patent. The ramus had 20-30% ostial stenosis. OM-1 was large, which was patent. OM-2 was small which was also patent.  RCA was patent.  The patient tolerated the procedure well. There were no complications. Arteriotomy was closed with Perclose without complications.  The patient was transferred to the recovery room in stable condition. Dictated by:   Eduardo Osier Sharyn Lull, M.D. Attending Physician:  Robynn Pane DD:  11/28/01 TD:  11/30/01 Job: [id number]

## 2010-11-06 NOTE — Letter (Signed)
July 14, 2006    Kayla [name]. Caryn Section, M.D.  [address]  [place], [place] [address]   RE:  Kayla, [name]  MRN:  [id number]  /  DOB:  [birth year]   Dear Kayla [name]:   [name] is a 70 year old black female who I follow for general  medical purposes.  She has a history of adult onset diabetes and was  previously on Metformin therapy but she did not tolerate this and she  was switched to Avandia 2 mg a day.  This was discontinued due to  expense and she has, in fact, been controlled with diet alone since  then.  She also has a history of hypertension and is followed by Dr.  Sharyn Lull for cardiology.  He has had Kayla on a variety of medications  including Norvasc and atenolol but she had some sort of intolerance and  he switched Kayla to Avalide 300/25 last year but once again this was  changed in favor of plain Avapro because of some increased urination.  The reason for the referral is because of proteinuria.  She was sent to  me by Kayla gynecologist who noticed protein in Kayla urine which was  confirmed on repeat testing.  I went ahead and performed a 24-hour urine  collection and found 2,153 mg of protein in the 24-hour collection.  In  reviewing Kayla old chart, she has had normal renal function with  creatinine around 1 going back several years.  She did have a previous  urinalysis in 2005, that showed some DIP stick protein and a previous  urinalysis in 2000, that was negative without protein.  I am including  copies of five years' of lab data for your records and as always, I  appreciate your evaluation.   [name]' problem list includes:  1. Asthmatic bronchitis - nonsmoker.  2. Hypertension - Dr. Sharyn Lull.  3. Diabetes mellitus - diet-controlled at present.  4. Diverticulosis and constipation.  She takes MiraLax and Senokot as      needed.  5. Fibromyalgia and degenerative arthritis with multiple somatic      complaints.  She is not taking any nonsteroidal antiinflammatory      meds  and uses Tylenol as needed.  6. History of anxiety and depression in the past.  7. Venous insufficiency with mild edema intermittently.  8. Previous hysterectomy and bladder surgery.  9. History of noncompliance with medication regimen and multiple drug      sensitivities included listed allergy to penicillin and      prednisone.   Thank you very much for your evaluation.    Sincerely,      Kayla Cloud. Kriste Basque, MD  Electronically Signed    SMN/MedQ  DD: 07/14/2006  DT: 07/14/2006  Job #: [id number]

## 2010-11-30 ENCOUNTER — Emergency Department (HOSPITAL_COMMUNITY): Payer: Medicare Other

## 2010-11-30 ENCOUNTER — Emergency Department (HOSPITAL_COMMUNITY)
Admission: EM | Admit: 2010-11-30 | Discharge: 2010-11-30 | Disposition: A | Discharge status: Home / Self Care | Payer: Medicare Other | Attending: Emergency Medicine | Admitting: Emergency Medicine

## 2010-11-30 DIAGNOSIS — I10 Essential (primary) hypertension: Secondary | ICD-10-CM | POA: Insufficient documentation

## 2010-11-30 DIAGNOSIS — X58XXXA Exposure to other specified factors, initial encounter: Secondary | ICD-10-CM | POA: Insufficient documentation

## 2010-11-30 DIAGNOSIS — T783XXA Angioneurotic edema, initial encounter: Secondary | ICD-10-CM | POA: Insufficient documentation

## 2010-11-30 LAB — GLUCOSE, CAPILLARY: Glucose-Capillary: 152 mg/dL — ABNORMAL HIGH (ref 70–99)

## 2011-01-29 ENCOUNTER — Encounter: Payer: Self-pay | Admitting: Pulmonary Disease

## 2011-04-05 ENCOUNTER — Encounter: Payer: Self-pay | Admitting: Pulmonary Disease

## 2011-04-05 ENCOUNTER — Ambulatory Visit (INDEPENDENT_AMBULATORY_CARE_PROVIDER_SITE_OTHER): Payer: Medicare Other | Admitting: Pulmonary Disease

## 2011-04-05 ENCOUNTER — Telehealth: Payer: Self-pay | Admitting: Pulmonary Disease

## 2011-04-05 DIAGNOSIS — K5730 Diverticulosis of large intestine without perforation or abscess without bleeding: Secondary | ICD-10-CM

## 2011-04-05 DIAGNOSIS — N993 Prolapse of vaginal vault after hysterectomy: Secondary | ICD-10-CM

## 2011-04-05 DIAGNOSIS — R7309 Other abnormal glucose: Secondary | ICD-10-CM

## 2011-04-05 DIAGNOSIS — IMO0001 Reserved for inherently not codable concepts without codable children: Secondary | ICD-10-CM

## 2011-04-05 DIAGNOSIS — K5900 Constipation, unspecified: Secondary | ICD-10-CM

## 2011-04-05 DIAGNOSIS — I10 Essential (primary) hypertension: Secondary | ICD-10-CM

## 2011-04-05 DIAGNOSIS — I872 Venous insufficiency (chronic) (peripheral): Secondary | ICD-10-CM

## 2011-04-05 DIAGNOSIS — M1990 Unspecified osteoarthritis, unspecified site: Secondary | ICD-10-CM

## 2011-04-05 DIAGNOSIS — D573 Sickle-cell trait: Secondary | ICD-10-CM

## 2011-04-05 DIAGNOSIS — F411 Generalized anxiety disorder: Secondary | ICD-10-CM

## 2011-04-05 DIAGNOSIS — E7800 Pure hypercholesterolemia, unspecified: Secondary | ICD-10-CM

## 2011-04-05 MED ORDER — FIRST-DUKES MOUTHWASH MT SUSP: OROMUCOSAL | Status: DC

## 2011-04-05 NOTE — Progress Notes (Signed)
Subjective:    Patient ID: Kayla [name], female    DOB: Feb 04, 1941, 70 y.o.   MRN: [id number]  HPI 70 y/o BF here for a follow up visit... she has mult med problems as noted below...  She has HBP treated by [facility] w/ ATENOLOL 50mg /d and BENICAR/HCT 40-25 daily... states her control has been good... he did her lab work and he wanted her to start CRESTOR which she says she takes only every once in awhile- true to her history of chronic non-compliance w/ medical therapy & she states she's afraid of meds since she has "so many reactions to things"...  ~  May 21, 2009:  presents w/ pain left side of buttock x 2-3 weeks w/ "cyst"... exam shows "shingles" left S2 distrib- mild rash, mild-mod pain by her account... we discussed Rx w/ Acyclovir + Medrol Dosepak, and OTC pain meds Prn (all symptoms resolved)...  she notes that her BP has been well controlled- Chol still monitored by [facility], we don't have labs and she doesn't know her numbers...  ~  November 19, 2009:  she is c/o some shoulder & arm pain- eval by Ortho DrHilts ?arthritis ?rotator cuff & she will f/u w/ him... rec> try MOBIC 7.5mg /d in the interim... she had bladder surg> robotic sacrocolpopexy 4/11 by DrMacDiarmid- improved symptomatically... still sees [facility] but wants fasting labs here today & we will send copies.  ~  August 25, 2010:  36mo ROV & she has mult somatic complaints> ?UTI w/ dysuria & followed by DrMacDiarmid for Urology (UA=neg, C&S- no growth);  LBP & esp right leg discomfort- hard for her to describe- x several months, we will rx w/ Tramadol & she will f/u w/ Ortho DrHilts;  notes "lots of food allergies- strawberry, oranges, tomatos, nuts, fish" w/ rash & puffy eyes (I offered RAST testing but she's not interested);  recurrent "shingles" rash on left side of buttock x 3-4d (we did HSV testing and results pos for 1&2, treated w/ Acyclovir);  she has Alprax for nerves but feels depressed & we rec trial Sertraline  50mg /d...  ~  April 05, 2011:  36mo ROV & she has mult somatic complaints including fatigue, runny nose, drainage coating her mouth, chest congestion, "I lost my smile" she says due to extensive dental work she has required; she tells me she stopped the Sertraline after last OV due to the way it made her feel; she went to the ER 6/12 w/ dx of Angioedema- likely from her Lisinopril & was therefore switched to Benicar; she requests MMW to use as needed for her mouth symptoms, & notes that Benedryl helps as well...    HBP> on Atenolol & BenicarHCT; BP= 140/82 & still followed for Cards by Maia Breslow;     Ven Insuffic> on low sodium diet, elevation, support hose; wt=170# which is down 11# this year...    Chol> on Diet alone & managed by [facility]; we do not have his labs...    DM> on Diet alone as well & her last BS in EPIC was 152 in Jun2012; we discussed the need for blood work here & she wants to wait until her f/u visit...    Divertics, Constip> on OTC laxatives as needed...    Vag prolapse> followed by Urology...    DJD. FM, Somatic dysfunction> as above- and she is reminded of the importance of gentle stretching exercises etc...    Anxiety> on Alprazolam as needed & encouraged to use it more regularly.Marland KitchenMarland Kitchen  Sickle Trait> aware- last Hg= 14.3 in Mar2012...          Problem List:  HYPERTENSION (ICD-401.9) - on BENICAR/ HCT 40-25 daily + ATENOLOL 50mg - 1/2 tab/d... Mod HBP that is easy enough to control when she takes her meds regularly... DrHarwani has assumed management of her BP... BP today= 136/80, & denies HA, fatigue, visual changes, CP, palipit, dizziness, syncope, dyspnea, edema, etc...  ~  CXR 1/09 showed clear lungs, sl cardiomegaly w/o CHF... ~  she continues to have regular check ups w/ DrHarwani...  VENOUS INSUFFICIENCY (ICD-459.81) - on low sodium diet, elevation, support hose & the Hct.  HYPERCHOLESTEROLEMIA, MILD (ICD-272.0 - followed & treated by [facility] on diet alone, we  don't have his labs or notes. ~  FLP here 11/04 showed TChol 214, TG 122, HDL 44, LDL 145 ~  FLP here 3/08 showed TChol 164, TG 59, HDL 45, LDL 107 ~  FLP here 6/11 showed TChol 163, Tg 60, HDL 45, LDL 106... continue diet efforts.  DIABETES MELLITUS, BORDERLINE (ICD-790.29)) - diet controlled... BS all in the 100-110 range when last checked here (we don't have labs from [facility])... hx intol to Metformin in past... ~  lab 10/09 showed BS= 85 ~  12/10:  fingerstick BS here = 87 ~  labs here 6/11 showed BS= 97, A1c= 6.2.Marland KitchenMarland Kitchen continue diet Rx.  DIVERTICULOSIS OF COLON (ICD-562.10) - colonoscopy 4/04 by DrBrodie showed divertics, f/u 40yr. CONSTIPATION (ICD-564.00) - we discussed Rx w/ MIRALAX Bid + SENAKOT-S 1-2 Qhs...  PROLAPSE OF VAGINAL VAULT AFTER HYSTERECTOMY (ICD-618.5) - evals by GYN & Urology- DrKimbrough, MacDiarmid, Borden... she has vault prolapse w/ cystocele, rectocele, enterocele & has failed pessary Rx- s/p robotic sacrocolpopexy 4/11 by DrMacDiarmid & improved.  Hx of PROTEINURIA (ICD-791.0) - she had renal eval DrFox 2/08 w/ impression of prob hypertensive nephrosclerosis... she was encouraged to take her meds regularly to try an acheive adeq BP control... ~  labs 6/11 showed no proteinuria...  DEGENERATIVE JOINT DISEASE (ICD-715.90) - she takes arthritis Tylenol + Aleve Prn... FIBROMYALGIA (ICD-729.1) SOMATIC DYSFUNCTION (ICD-739.9) - hx of mult somatic complaints w/ neuro eval by DrSteifel in 1995... nothing found.  ANXIETY (ICD-300.00) - on ALPRAZOLAM 0.5mg - 1/2 - 1 tab Tid Prn...   PERS HX NONCOMPLIANCE W/MED TX (ICD-V15.81) - AS NOTED...  Hx of SICKLE-CELL TRAIT (ICD-282.5) - pt reports + test for sickle trait in the past. ~  labs 10/09 showed Hg= 14.4 ~  labs 6/11 showed Hg= 13.4, MCV= 87   Past Surgical History  Procedure Date  . Abdominal hysterectomy   . Robotic assisted laparoscopic sacrocolpopexy 09/2009    Dr. Sherron Monday    Outpatient Encounter Prescriptions  as of 04/05/2011  Medication Sig Dispense Refill  . ALPRAZolam (XANAX) 0.5 MG tablet Take 0.5 mg by mouth. 1/2 to 1 3 times a day for nerves       . atenolol (TENORMIN) 50 MG tablet Take 25 mg by mouth daily.        [name] dextromethorphan-guaiFENesin (MUCINEX DM) 30-600 MG per 12 hr tablet Take 1 tablet by mouth. 1-2 TIMES DAILY AS NEEDED       . olmesartan-hydrochlorothiazide (BENICAR HCT) 40-25 MG per tablet Take 1 tablet by mouth daily.          Allergies  Allergen Reactions  . Penicillins     REACTION: red rash    Current Medications, Allergies, Past Medical History, Past Surgical History, Family History, and Social History were reviewed in [employer]  medical record.    Review of Systems        See HPI - all other systems neg except as noted...  The patient complains of decreased hearing, dyspnea on exertion, muscle weakness, difficulty walking, and depression.  The patient denies anorexia, fever, weight loss, weight gain, vision loss, hoarseness, chest pain, syncope, peripheral edema, prolonged cough, headaches, hemoptysis, abdominal pain, melena, hematochezia, severe indigestion/heartburn, hematuria, incontinence, suspicious skin lesions, transient blindness, unusual weight change, abnormal bleeding, enlarged lymph nodes, and angioedema.    Objective:   Physical Exam     WD, WN, 70 y/o BF in NAD... GENERAL:  Alert & oriented; pleasant & cooperative... she has mult trigger points bilat... HEENT:  [place]/AT, EOM-wnl, PERRLA, EACs-clear, TMs-wnl, NOSE-clear, THROAT-clear & wnl. NECK:  Supple w/ fairROM; no JVD; normal carotid impulses w/o bruits; no thyromegaly or nodules palpated; no lymphadenopathy. CHEST:  few scat rhonchi bilat without rales or consolidation... HEART:  Regular Rhythm; without murmurs/ rubs/ or gallops. ABDOMEN:  Soft & nontender; normal bowel sounds; no organomegaly or masses detected. EXT: without deformities, mild arthritic changes; no varicose  veins/ +venous insuffic/ no edema. NEURO:  CN's intact; no focal neuro deficits... DERM:  No lesions noted; no rash etc...  RADIOLOGY DATA:  Reviewed in the EPIC EMR & discussed w/ the patient...  LABORATORY DATA:  Reviewed in the EPIC EMR & discussed w/ the patient...   Assessment & Plan:   HBP> on Atenolol & BenicarHCT; BP= 140/82 & still followed for Cards by [facility]...     Ven Insuffic> on low sodium diet, elevation, support hose; wt=170# which is down 11# this year...     Chol> on Diet alone & managed by [facility]; we do not have his labs...     DM> on Diet alone as well & her last BS in EPIC was 152 in Jun2012; we discussed the need for blood work here & she wants to wait until her f/u visit...     Divertics, Constip> on OTC laxatives as needed...     Vag prolapse> followed by Urology...     DJD. FM, Somatic dysfunction> as above- and she is reminded of the importance of gentle stretching exercises etc...     Anxiety> on Alprazolam as needed & encouraged to use it more regularly...     Sickle Trait> aware- last Hg= 14.3 in Mar2012.Marland KitchenMarland Kitchen

## 2011-04-05 NOTE — Patient Instructions (Signed)
Today we updated your med list in our EPIC system...    We wrote a new prescription for Magic Mouthwash to use as needed for mouth & throat symptoms...  You may also use the OTC MUCINEX 1-2 twice daily w/ lots of fluids for the congestion...  Call for any questions...  Let's plan a follow up visit in 4-25months w/ FASTING blood work at that time.Marland KitchenMarland Kitchen

## 2011-04-05 NOTE — Telephone Encounter (Signed)
I spoke with pt and she c/o runny nose, feels stuffy, chest pressure, cough x 1 week. Pt requested to be seen today by SN or TP. Since SN had an opening today at 2:30 pt is coming in at that time to see SN.

## 2011-04-19 ENCOUNTER — Encounter: Payer: Self-pay | Admitting: Pulmonary Disease

## 2011-09-07 ENCOUNTER — Ambulatory Visit (INDEPENDENT_AMBULATORY_CARE_PROVIDER_SITE_OTHER): Payer: Medicare Other | Admitting: Pulmonary Disease

## 2011-09-07 ENCOUNTER — Encounter: Payer: Self-pay | Admitting: Pulmonary Disease

## 2011-09-07 ENCOUNTER — Other Ambulatory Visit (INDEPENDENT_AMBULATORY_CARE_PROVIDER_SITE_OTHER): Payer: Medicare Other

## 2011-09-07 VITALS — BP 130/90 | HR 110 | Temp 97.9°F | Ht 67.0 in | Wt 171.0 lb

## 2011-09-07 DIAGNOSIS — E7800 Pure hypercholesterolemia, unspecified: Secondary | ICD-10-CM

## 2011-09-07 DIAGNOSIS — Z9119 Patient's noncompliance with other medical treatment and regimen: Secondary | ICD-10-CM

## 2011-09-07 DIAGNOSIS — I872 Venous insufficiency (chronic) (peripheral): Secondary | ICD-10-CM

## 2011-09-07 DIAGNOSIS — K5900 Constipation, unspecified: Secondary | ICD-10-CM

## 2011-09-07 DIAGNOSIS — R7309 Other abnormal glucose: Secondary | ICD-10-CM

## 2011-09-07 DIAGNOSIS — M999 Biomechanical lesion, unspecified: Secondary | ICD-10-CM

## 2011-09-07 DIAGNOSIS — F411 Generalized anxiety disorder: Secondary | ICD-10-CM

## 2011-09-07 DIAGNOSIS — K5730 Diverticulosis of large intestine without perforation or abscess without bleeding: Secondary | ICD-10-CM

## 2011-09-07 DIAGNOSIS — IMO0001 Reserved for inherently not codable concepts without codable children: Secondary | ICD-10-CM

## 2011-09-07 DIAGNOSIS — I10 Essential (primary) hypertension: Secondary | ICD-10-CM

## 2011-09-07 DIAGNOSIS — M1990 Unspecified osteoarthritis, unspecified site: Secondary | ICD-10-CM

## 2011-09-07 DIAGNOSIS — N993 Prolapse of vaginal vault after hysterectomy: Secondary | ICD-10-CM

## 2011-09-07 LAB — BASIC METABOLIC PANEL
Calcium: 9.5 mg/dL (ref 8.4–10.5)
Chloride: 104 mEq/L (ref 96–112)
Creatinine, Ser: 0.9 mg/dL (ref 0.4–1.2)

## 2011-09-07 LAB — LIPID PANEL
LDL Cholesterol: 106 mg/dL — ABNORMAL HIGH (ref 0–99)
Total CHOL/HDL Ratio: 4
Triglycerides: 95 mg/dL (ref 0.0–149.0)

## 2011-09-07 LAB — CBC WITH DIFFERENTIAL/PLATELET
Basophils Absolute: 0.1 10*3/uL (ref 0.0–0.1)
Basophils Relative: 1.2 % (ref 0.0–3.0)
HCT: 46.5 % — ABNORMAL HIGH (ref 36.0–46.0)
Hemoglobin: 15.3 g/dL — ABNORMAL HIGH (ref 12.0–15.0)
Lymphs Abs: 1.3 10*3/uL (ref 0.7–4.0)
MCHC: 32.9 g/dL (ref 30.0–36.0)
MCV: 88.3 fl (ref 78.0–100.0)
RBC: 5.26 Mil/uL — ABNORMAL HIGH (ref 3.87–5.11)

## 2011-09-07 LAB — HEMOGLOBIN A1C: Hgb A1c MFr Bld: 6.1 % (ref 4.6–6.5)

## 2011-09-07 LAB — HEPATIC FUNCTION PANEL
ALT: 18 U/L (ref 0–35)
Bilirubin, Direct: 0.1 mg/dL (ref 0.0–0.3)
Total Bilirubin: 0.5 mg/dL (ref 0.3–1.2)

## 2011-09-07 LAB — TSH: TSH: 1.28 u[IU]/mL (ref 0.35–5.50)

## 2011-09-07 MED ORDER — FIRST-DUKES MOUTHWASH MT SUSP: OROMUCOSAL | Status: DC

## 2011-09-07 NOTE — Progress Notes (Addendum)
Subjective:    Patient ID: Kayla [name], female    DOB: Jun 02, 1941, 71 y.o.   MRN: [id number]  HPI 71 y/o BF here for a follow up visit... she has mult med problems as noted below...  She has HBP treated by [facility] w/ ATENOLOL 50mg /d and BENICAR/HCT 40-25 daily... states her control has been good... he did her lab work and he wanted her to start CRESTOR which she says she takes only every once in awhile- true to her history of chronic non-compliance w/ medical therapy & she states she's afraid of meds since she has "so many reactions to things"...  ~  August 25, 2010:  72mo ROV & she has mult somatic complaints> ?UTI w/ dysuria & followed by DrMacDiarmid for Urology (UA=neg, C&S- no growth);  LBP & esp right leg discomfort- hard for her to describe- x several months, we will rx w/ Tramadol & she will f/u w/ Ortho DrHilts;  notes "lots of food allergies- strawberry, oranges, tomatos, nuts, fish" w/ rash & puffy eyes (I offered RAST testing but she's not interested);  recurrent "shingles" rash on left side of buttock x 3-4d (we did HSV testing and results pos for 1&2, treated w/ Acyclovir);  she has Alprax for nerves but feels depressed & we rec trial Sertraline 50mg /d...  ~  April 05, 2011:  72mo ROV & she has mult somatic complaints including fatigue, runny nose, drainage coating her mouth, chest congestion, "I lost my smile" she says due to extensive dental work she has required; she tells me she stopped the Sertraline after last OV due to the way it made her feel; she went to the ER 6/12 w/ dx of Angioedema- likely from her Lisinopril & was therefore switched to Benicar; she requests MMW to use as needed for her mouth symptoms, & notes that Benedryl helps as well...    HBP> on Atenolol & BenicarHCT; BP= 140/82 & still followed for Cards by Maia Breslow;     Ven Insuffic> on low sodium diet, elevation, support hose; wt=170# which is down 11# this year...    Chol> on Diet alone & managed by [facility];  we do not have his labs...    DM> on Diet alone as well & her last BS in EPIC was 152 in Jun2012; we discussed the need for blood work here & she wants to wait until her f/u visit...    Divertics, Constip> on OTC laxatives as needed...    Vag prolapse> followed by Urology...    DJD. FM, Somatic dysfunction> as above- and she is reminded of the importance of gentle stretching exercises etc...    Anxiety> on Alprazolam as needed & encouraged to use it more regularly...    Sickle Trait> aware- last Hg= 14.3 in Mar2012...  ~  September 07, 2011:  72mo ROV & [place] has mult somatic complaints- feels dizzy, no energy, states she has trouble w/ meds- eg the MMW makes her sleepy & dizzy; she continues to have her CV problems managed by [facility];  We reviewed her meds, problem list, & labs> see below>> LABS 3/13:  FLP- ok x LDL=106 on Diet;  Chems- wnl w/ BS=102 A1c=6.1;  CBC= wnl;  TSH=1.28;            Problem List:  HYPERTENSION (ICD-401.9) - on BENICAR/ HCT 40-25 daily ("sometimes I only take 1/2") + ATENOLOL 50mg - 1/2 tab/d... Mod HBP that is easy enough to control when she takes her meds regularly... DrHarwani has assumed management  of her BP... ~  CXR 1/09 showed clear lungs, sl cardiomegaly w/o CHF... ~  she continues to have regular check ups w/ DrHarwani... ~  10/12:  BP today= 136/80, & denies HA, fatigue, visual changes, CP, palipit, dizziness, syncope, dyspnea, edema, etc...  ~  3/13:  BP= 130/100 & reminded to take meds regualarly & f/u w/ DrHarwani..  VENOUS INSUFFICIENCY (ICD-459.81) - on low sodium diet, elevation, support hose & the Hct.  HYPERCHOLESTEROLEMIA, MILD (ICD-272.0 - followed & treated by [facility] on diet alone, we don't have his labs or notes. ~  FLP here 11/04 showed TChol 214, TG 122, HDL 44, LDL 145 ~  FLP here 3/08 showed TChol 164, TG 59, HDL 45, LDL 107 ~  FLP here 6/11 showed TChol 163, Tg 60, HDL 45, LDL 106... continue diet efforts. ~  She has labs done by DrWarwani  (we do not have records from him)... ~  FLP here 3/13 on Diet alone showed TChol 174, TG 95, HDL 49, LDL 106  DIABETES MELLITUS, BORDERLINE (ICD-790.29)) - diet controlled... BS all in the 100-110 range when last checked here (we don't have labs from [facility])... hx intol to Metformin in past... ~  lab 10/09 showed BS= 85 ~  12/10:  fingerstick BS here = 87 ~  labs here 6/11 showed BS= 97, A1c= 6.2.Marland KitchenMarland Kitchen continue diet Rx. ~  She has labs done by DrWarwani (we do not have records from him)... ~  Labs here 3/13 showed BS= 102, A1c= 6.1  DIVERTICULOSIS OF COLON (ICD-562.10) - colonoscopy 4/04 by DrBrodie showed divertics, f/u 71yr. CONSTIPATION (ICD-564.00) - we discussed Rx w/ MIRALAX Bid + SENAKOT-S 1-2 Qhs...  PROLAPSE OF VAGINAL VAULT AFTER HYSTERECTOMY (ICD-618.5) - evals by GYN & Urology- DrKimbrough, MacDiarmid, Borden... she has vault prolapse w/ cystocele, rectocele, enterocele & failed pessary Rx- s/p robotic sacrocolpopexy 4/11 by DrMacDiarmid & improved.  Hx of PROTEINURIA (ICD-791.0) - she had renal eval DrFox 2/08 w/ impression of prob hypertensive nephrosclerosis... she was encouraged to take her meds regularly to try an acheive adeq BP control... ~  labs 6/11 showed no proteinuria...  DEGENERATIVE JOINT DISEASE (ICD-715.90) - she takes arthritis Tylenol + Aleve Prn... FIBROMYALGIA (ICD-729.1) SOMATIC DYSFUNCTION (ICD-739.9) - hx of mult somatic complaints w/ neuro eval by DrSteifel in 1995... nothing found.  ANXIETY (ICD-300.00) - on ALPRAZOLAM 0.5mg - 1/2 - 1 tab Tid Prn...   PERS HX NONCOMPLIANCE W/MED TX (ICD-V15.81) - AS NOTED...  Hx of SICKLE-CELL TRAIT (ICD-282.5) - pt reports + test for sickle trait in the past. ~  labs 10/09 showed Hg= 14.4 ~  labs 6/11 showed Hg= 13.4, MCV= 87 ~  Labs 3/13 here showed Hg= 15.3, MCV= 88  Hx SHINGLES >> presented w/ pain left side of buttock 12/10 w/ "cyst"> exam showed "shingles" left S2 distrib- mild rash, mild-mod pain by her account; we  discussed Rx w/ Acyclovir + Medrol Dosepak, and OTC pain meds Prn (all symptoms resolved)   Past Surgical History  Procedure Date  . Abdominal hysterectomy   . Robotic assisted laparoscopic sacrocolpopexy 09/2009    Dr. Sherron Monday    Outpatient Encounter Prescriptions as of 09/07/2011  Medication Sig Dispense Refill  . atenolol (TENORMIN) 50 MG tablet Take 25 mg by mouth daily.        [name] dextromethorphan-guaiFENesin (MUCINEX DM) 30-600 MG per 12 hr tablet Take 1 tablet by mouth. 1-2 TIMES DAILY AS NEEDED       . Diphenhyd-Hydrocort-Nystatin (FIRST-DUKES MOUTHWASH) SUSP 1 teaspoon by  mouth gargle and swallow four times daily as needed  120 mL  5  . estradiol (ESTRACE) 0.1 MG/GM vaginal cream Place 2 g vaginally daily.      [name] olmesartan-hydrochlorothiazide (BENICAR HCT) 40-25 MG per tablet Take 1 tablet by mouth daily.        [name] omeprazole (PRILOSEC) 20 MG capsule Take 20 mg by mouth daily. 30 minutes prior to a meal      . ALPRAZolam (XANAX) 0.5 MG tablet Take 0.5 mg by mouth. 1/2 to 1 3 times a day for nerves         Allergies  Allergen Reactions  . Penicillins     REACTION: red rash    Current Medications, Allergies, Past Medical History, Past Surgical History, Family History, and Social History were reviewed in [employer] record.    Review of Systems        See HPI - all other systems neg except as noted...  The patient complains of decreased hearing, dyspnea on exertion, muscle weakness, difficulty walking, and depression.  The patient denies anorexia, fever, weight loss, weight gain, vision loss, hoarseness, chest pain, syncope, peripheral edema, prolonged cough, headaches, hemoptysis, abdominal pain, melena, hematochezia, severe indigestion/heartburn, hematuria, incontinence, suspicious skin lesions, transient blindness, unusual weight change, abnormal bleeding, enlarged lymph nodes, and angioedema.    Objective:   Physical Exam     WD, WN, 70 y/o BF in  NAD... GENERAL:  Alert & oriented; pleasant & cooperative... she has mult trigger points bilat... HEENT:  [place]/AT, EOM-wnl, PERRLA, EACs-clear, TMs-wnl, NOSE-clear, THROAT-clear & wnl. NECK:  Supple w/ fairROM; no JVD; normal carotid impulses w/o bruits; no thyromegaly or nodules palpated; no lymphadenopathy. CHEST:  few scat rhonchi bilat without rales or consolidation... HEART:  Regular Rhythm; without murmurs/ rubs/ or gallops. ABDOMEN:  Soft & nontender; normal bowel sounds; no organomegaly or masses detected. EXT: without deformities, mild arthritic changes; no varicose veins/ +venous insuffic/ no edema. NEURO:  CN's intact; no focal neuro deficits... DERM:  No lesions noted; no rash etc...  RADIOLOGY DATA:  Reviewed in the EPIC EMR & discussed w/ the patient...  LABORATORY DATA:  Reviewed in the EPIC EMR & discussed w/ the patient...   Assessment & Plan:   HBP> on Atenolol & BenicarHCT;  Followed for Cards by [facility];  BP has been easily controlled when she takes meds regularly; reminded to take every day!     Ven Insuffic> on low sodium diet, elevation, support hose; wt=170# which is down 11# this year...     Chol> on Diet alone & managed by [facility]; FLP here 3/13 was ok x LDL 106.     DM> on Diet alone as well & her BS/ A1c are ok...     Divertics, Constip> on OTC laxatives as needed...     Vag prolapse> followed by Urology s/p surg...     DJD. FM, Somatic dysfunction> as above- and she is reminded of the importance of gentle stretching exercises etc...     Anxiety> on Alprazolam as needed & encouraged to use it more regularly...     Sickle Trait> aware- CBC is wnl...   Patient's Medications  New Prescriptions   No medications on file  Previous Medications   ALPRAZOLAM (XANAX) 0.5 MG TABLET    Take 0.5 mg by mouth. 1/2 to 1 3 times a day for nerves    ATENOLOL (TENORMIN) 50 MG TABLET    Take 25 mg by mouth daily.  DEXTROMETHORPHAN-GUAIFENESIN (MUCINEX DM)  30-600 MG PER 12 HR TABLET    Take 1 tablet by mouth. 1-2 TIMES DAILY AS NEEDED    ESTRADIOL (ESTRACE) 0.1 MG/GM VAGINAL CREAM    Place 2 g vaginally daily.   OLMESARTAN-HYDROCHLOROTHIAZIDE (BENICAR HCT) 40-25 MG PER TABLET    Take 1 tablet by mouth daily.     OMEPRAZOLE (PRILOSEC) 20 MG CAPSULE    Take 20 mg by mouth daily. 30 minutes prior to a meal  Modified Medications   Modified Medication Previous Medication   DIPHENHYD-HYDROCORT-NYSTATIN (FIRST-DUKES MOUTHWASH) SUSP Diphenhyd-Hydrocort-Nystatin (FIRST-DUKES MOUTHWASH) SUSP      1 teaspoon by mouth gargle and swallow four times daily as needed    1 teaspoon by mouth gargle and swallow four times daily as needed  Discontinued Medications   No medications on file

## 2011-09-07 NOTE — Patient Instructions (Signed)
Today we updated your med list in our EPIC system...    Continue your current medications the same...    We refilled your prescription for Magic Mouthwash to use as needed...  Today we did your follow up FASTING blood work...    We will call you w/ the results and we will send a copy to [facility] office...  Call for any questions...  Let's plan another follow up visit in 6 month's time.Marland KitchenMarland Kitchen

## 2011-09-28 ENCOUNTER — Telehealth: Payer: Self-pay | Admitting: Pulmonary Disease

## 2011-09-28 NOTE — Telephone Encounter (Signed)
Received copies from [facility],on 09/28/11 . Forwarded 1 pages to Dr. Olene Floss review.

## 2011-10-02 ENCOUNTER — Other Ambulatory Visit: Payer: Self-pay | Admitting: Cardiology

## 2011-10-15 ENCOUNTER — Encounter: Payer: Self-pay | Admitting: Pulmonary Disease

## 2011-10-21 ENCOUNTER — Telehealth: Payer: Self-pay | Admitting: Pulmonary Disease

## 2011-10-21 DIAGNOSIS — R809 Proteinuria, unspecified: Secondary | ICD-10-CM

## 2011-10-21 NOTE — Telephone Encounter (Signed)
lmomtcb x1 for pt 

## 2011-10-21 NOTE — Telephone Encounter (Signed)
Per SN---pt will need to come in to the lab next week for UA and C&S please.  She has had protein in the urine in the past.  thanks

## 2011-10-21 NOTE — Telephone Encounter (Signed)
I spoke with debby and she did house call in pt and wanted to let Dr. Kriste Basque know that she did a urine dipstick on pt and the protein was 2+ and glucose was negative. She stated the pt informed her she already see's urology. Pt is asymptomatic. Debby states they just have to let pt's pcp know this. Please advise Dr. Kriste Basque, thanks

## 2011-10-22 NOTE — Telephone Encounter (Signed)
lmomtcb x2 for pt 

## 2011-10-25 NOTE — Telephone Encounter (Signed)
Pt returned triage's call.  Kayla [name] ° °

## 2011-10-25 NOTE — Telephone Encounter (Signed)
lmomtcb x1 for pt 

## 2011-10-26 ENCOUNTER — Other Ambulatory Visit (INDEPENDENT_AMBULATORY_CARE_PROVIDER_SITE_OTHER): Payer: Medicare Other

## 2011-10-26 DIAGNOSIS — R809 Proteinuria, unspecified: Secondary | ICD-10-CM

## 2011-10-26 LAB — URINALYSIS
Bilirubin Urine: NEGATIVE
Ketones, ur: NEGATIVE
Specific Gravity, Urine: 1.01 (ref 1.000–1.030)
Urine Glucose: NEGATIVE
pH: 6 (ref 5.0–8.0)

## 2011-10-26 NOTE — Telephone Encounter (Signed)
lmomtcb x 2  

## 2011-10-26 NOTE — Telephone Encounter (Signed)
Pt returned call.  Kayla [name] ° °

## 2011-10-26 NOTE — Telephone Encounter (Signed)
I spoke with pt and she states she will go down to the lab and leave sample. Order has been placed and nothing further was needed

## 2011-10-27 LAB — URINE CULTURE: Organism ID, Bacteria: NO GROWTH

## 2012-02-02 ENCOUNTER — Other Ambulatory Visit (HOSPITAL_COMMUNITY): Payer: Self-pay | Admitting: Cardiology

## 2012-02-02 DIAGNOSIS — R079 Chest pain, unspecified: Secondary | ICD-10-CM

## 2012-02-03 ENCOUNTER — Encounter: Payer: Self-pay | Admitting: Pulmonary Disease

## 2012-02-11 ENCOUNTER — Encounter (HOSPITAL_COMMUNITY)
Admission: RE | Admit: 2012-02-11 | Discharge: 2012-02-11 | Disposition: A | Discharge status: Home / Self Care | Payer: Medicare Other | Source: Ambulatory Visit | Attending: Cardiology | Admitting: Cardiology

## 2012-02-11 DIAGNOSIS — E119 Type 2 diabetes mellitus without complications: Secondary | ICD-10-CM | POA: Insufficient documentation

## 2012-02-11 DIAGNOSIS — R079 Chest pain, unspecified: Secondary | ICD-10-CM | POA: Insufficient documentation

## 2012-02-11 DIAGNOSIS — I10 Essential (primary) hypertension: Secondary | ICD-10-CM | POA: Insufficient documentation

## 2012-02-11 DIAGNOSIS — R9431 Abnormal electrocardiogram [ECG] [EKG]: Secondary | ICD-10-CM | POA: Insufficient documentation

## 2012-02-11 MED ORDER — REGADENOSON 0.4 MG/5ML IV SOLN
0.4000 mg | Freq: Once | INTRAVENOUS | Status: AC
  Administered 2012-02-11: 0.4 mg via INTRAVENOUS

## 2012-02-11 MED ORDER — TECHNETIUM TC 99M TETROFOSMIN IV KIT
10.0000 | PACK | Freq: Once | INTRAVENOUS | Status: AC | PRN
  Administered 2012-02-11: 10 via INTRAVENOUS

## 2012-02-11 MED ORDER — TECHNETIUM TC 99M TETROFOSMIN IV KIT
30.0000 | PACK | Freq: Once | INTRAVENOUS | Status: AC | PRN
  Administered 2012-02-11: 30 via INTRAVENOUS

## 2012-02-11 MED ORDER — REGADENOSON 0.4 MG/5ML IV SOLN
INTRAVENOUS | Status: AC
  Filled 2012-02-11: qty 5

## 2012-03-09 ENCOUNTER — Ambulatory Visit: Payer: Medicare Other | Admitting: Pulmonary Disease

## 2012-04-05 ENCOUNTER — Encounter: Payer: Self-pay | Admitting: *Deleted

## 2012-04-06 ENCOUNTER — Ambulatory Visit (INDEPENDENT_AMBULATORY_CARE_PROVIDER_SITE_OTHER): Payer: Medicare Other | Admitting: Pulmonary Disease

## 2012-04-06 ENCOUNTER — Encounter: Payer: Self-pay | Admitting: Pulmonary Disease

## 2012-04-06 VITALS — BP 128/78 | HR 67 | Temp 97.0°F | Ht 67.0 in | Wt 185.2 lb

## 2012-04-06 DIAGNOSIS — K5900 Constipation, unspecified: Secondary | ICD-10-CM

## 2012-04-06 DIAGNOSIS — K5730 Diverticulosis of large intestine without perforation or abscess without bleeding: Secondary | ICD-10-CM

## 2012-04-06 DIAGNOSIS — R7309 Other abnormal glucose: Secondary | ICD-10-CM

## 2012-04-06 DIAGNOSIS — IMO0001 Reserved for inherently not codable concepts without codable children: Secondary | ICD-10-CM

## 2012-04-06 DIAGNOSIS — N993 Prolapse of vaginal vault after hysterectomy: Secondary | ICD-10-CM

## 2012-04-06 DIAGNOSIS — I872 Venous insufficiency (chronic) (peripheral): Secondary | ICD-10-CM

## 2012-04-06 DIAGNOSIS — M999 Biomechanical lesion, unspecified: Secondary | ICD-10-CM

## 2012-04-06 DIAGNOSIS — D573 Sickle-cell trait: Secondary | ICD-10-CM

## 2012-04-06 DIAGNOSIS — M1990 Unspecified osteoarthritis, unspecified site: Secondary | ICD-10-CM

## 2012-04-06 DIAGNOSIS — I10 Essential (primary) hypertension: Secondary | ICD-10-CM

## 2012-04-06 DIAGNOSIS — E7800 Pure hypercholesterolemia, unspecified: Secondary | ICD-10-CM

## 2012-04-06 NOTE — Progress Notes (Signed)
Subjective:    Patient ID: Kayla [name], female    DOB: [birth year], 71 y.o.   MRN: [id number]  HPI 71 y/o BF here for a follow up visit... she has mult med problems as noted below...  She has HBP treated by [facility] w/ ATENOLOL 50mg /d and BENICAR/HCT 40-25 daily... states her control has been good... he did her lab work and he wanted her to start CRESTOR which she says she takes only every once in awhile- true to her history of chronic non-compliance w/ medical therapy & she states she's afraid of meds since she has "so many reactions to things"...  ~  August 25, 2010:  71mo ROV & she has mult somatic complaints> ?UTI w/ dysuria & followed by DrMacDiarmid for Urology (UA=neg, C&S- no growth);  LBP & esp right leg discomfort- hard for her to describe- x several months, we will rx w/ Tramadol & she will f/u w/ Ortho DrHilts;  notes "lots of food allergies- strawberry, oranges, tomatos, nuts, fish" w/ rash & puffy eyes (I offered RAST testing but she's not interested);  recurrent "shingles" rash on left side of buttock x 3-4d (we did HSV testing and results pos for 1&2, treated w/ Acyclovir);  she has Alprax for nerves but feels depressed & we rec trial Sertraline 50mg /d...  ~  April 05, 2011:  1mo ROV & she has mult somatic complaints including fatigue, runny nose, drainage coating her mouth, chest congestion, "I lost my smile" she says due to extensive dental work she has required; she tells me she stopped the Sertraline after last OV due to the way it made her feel; she went to the ER 6/12 w/ dx of Angioedema- likely from her Lisinopril & was therefore switched to Benicar; she requests MMW to use as needed for her mouth symptoms, & notes that Benedryl helps as well...    HBP> on Atenolol & BenicarHCT; BP= 140/82 & still followed for Cards by Maia Breslow;     Ven Insuffic> on low sodium diet, elevation, support hose; wt=170# which is down 11# this year...    Chol> on Diet alone & managed by [facility];  we do not have his labs...    DM> on Diet alone as well & her last BS in EPIC was 152 in Jun2012; we discussed the need for blood work here & she wants to wait until her f/u visit...    Divertics, Constip> on OTC laxatives as needed...    Vag prolapse> followed by Urology...    DJD. FM, Somatic dysfunction> as above- and she is reminded of the importance of gentle stretching exercises etc...    Anxiety> on Alprazolam as needed & encouraged to use it more regularly...    Sickle Trait> aware- last Hg= 14.3 in Mar2012...  ~  September 07, 2011:  72mo ROV & Kayla [name] has mult somatic complaints- feels dizzy, no energy, states she has trouble w/ meds- eg the MMW makes her sleepy & dizzy; she continues to have her CV problems managed by [facility];  We reviewed her meds, problem list, & labs> see below>> LABS 3/13:  FLP- ok x LDL=106 on Diet;  Chems- wnl w/ BS=102 A1c=6.1;  CBC= wnl;  TSH=1.28...  ~  April 06, 2012:  1mo ROV & Kayla [name] is c/o fatigue, no energy, swelling, nasal drainage, gas, etc> see below:     HBP> on Atenolol25 & BenicarHCT40-25; BP= 128/78 & still followed for Cards by [facility] & 8/13 Myoview at [facility] was neg.Marland KitchenMarland Kitchen  Ven Insuffic> on low sodium diet, elevation, support hose; wt up to 185# (gain of 14#) & we reviewed NO SALT, elevation, TEDs...    Chol> on Diet alone & managed by [facility]; we do not have his labs...    DM> on Diet alone as well & her last BS in EPIC was 102 in [id number]; we reviewed diet, exercise, etc...    Divertics, Constip> on OTC laxatives as needed; notes swelling, bloating & we reviewed Gas-X, Mylicon, Phazyme...    Vag prolapse> followed by Urology & GYN on Estrace; she saw DrMacDiarmid 7/13- stress & urge incont, tried Myrbetriq...     DJD. FM, Somatic dysfunction> as above- and she is reminded of the importance of gentle stretching exercises etc; she notes that calcium constipates her 7 asked to try Viactiv...     Anxiety> on Alprazolam as needed & encouraged to use it  more regularly...    Sickle Trait> aware- last Hg= 15.3 in Mar2013... We reviewed prob list, meds, xrays and labs> see below for updates >> she declines the 2013 flu vaccine...          Problem List:  HYPERTENSION (ICD-401.9) - on BENICAR/ HCT 40-25 daily ("sometimes I only take 1/2") + ATENOLOL 50mg - 1/2 tab/d... Mod HBP that is easy enough to control when she takes her meds regularly... DrHarwani has assumed management of her BP... ~  CXR 1/09 showed clear lungs, sl cardiomegaly w/o CHF... ~  she continues to have regular check ups w/ DrHarwani... ~  CXR 6/12 showed heart size wnl, clear lungs, NAD... ~  10/12:  BP today= 136/80, & denies HA, fatigue, visual changes, CP, palipit, dizziness, syncope, dyspnea, edema, etc...  ~  3/13:  BP= 130/100 & reminded to take meds regualarly & f/u w/ DrHarwani..  VENOUS INSUFFICIENCY (ICD-459.81) - on low sodium diet, elevation, support hose & the Hct.  HYPERCHOLESTEROLEMIA, MILD (ICD-272.0 - followed & treated by [facility] on diet alone, we don't have his labs or notes. ~  FLP here 11/04 showed TChol 214, TG 122, HDL 44, LDL 145 ~  FLP here 3/08 showed TChol 164, TG 59, HDL 45, LDL 107 ~  FLP here 6/11 showed TChol 163, Tg 60, HDL 45, LDL 106... continue diet efforts. ~  She has labs done by DrWarwani (we do not have records from him)... ~  FLP here 3/13 on Diet alone showed TChol 174, TG 95, HDL 49, LDL 106  DIABETES MELLITUS, BORDERLINE (ICD-790.29)) - diet controlled... BS all in the 100-110 range when last checked here (we don't have labs from [facility])... hx intol to Metformin in past... ~  lab 10/09 showed BS= 85 ~  12/10:  fingerstick BS here = 87 ~  labs here 6/11 showed BS= 97, A1c= 6.2.Marland KitchenMarland Kitchen continue diet Rx. ~  She has labs done by DrWarwani (we do not have records from him)... ~  Labs here 3/13 showed BS= 102, A1c= 6.1  DIVERTICULOSIS OF COLON (ICD-562.10) - colonoscopy 4/04 by DrBrodie showed divertics, f/u 63yr. CONSTIPATION (ICD-564.00) -  we discussed Rx w/ MIRALAX Bid + SENAKOT-S 1-2 Qhs...  PROLAPSE OF VAGINAL VAULT AFTER HYSTERECTOMY (ICD-618.5) - evals by GYN & Urology- DrKimbrough, MacDiarmid, Borden... she has vault prolapse w/ cystocele, rectocele, enterocele & failed pessary Rx- s/p robotic sacrocolpopexy 4/11 by DrMacDiarmid & improved.  Hx of PROTEINURIA (ICD-791.0) - she had renal eval DrFox 2/08 w/ impression of prob hypertensive nephrosclerosis... she was encouraged to take her meds regularly to try an acheive adeq BP control... ~  labs 6/11 showed no proteinuria...  DEGENERATIVE JOINT DISEASE (ICD-715.90) - she takes arthritis Tylenol + Aleve Prn... FIBROMYALGIA (ICD-729.1) SOMATIC DYSFUNCTION (ICD-739.9) - hx of mult somatic complaints w/ neuro eval by DrSteifel in 1995... nothing found.  ANXIETY (ICD-300.00) - on ALPRAZOLAM 0.5mg - 1/2 - 1 tab Tid Prn...   PERS HX NONCOMPLIANCE W/MED TX (ICD-V15.81) - AS NOTED...  Hx of SICKLE-CELL TRAIT (ICD-282.5) - pt reports + test for sickle trait in the past. ~  labs 10/09 showed Hg= 14.4 ~  labs 6/11 showed Hg= 13.4, MCV= 87 ~  Labs 3/13 here showed Hg= 15.3, MCV= 88  Hx SHINGLES >> presented w/ pain left side of buttock 12/10 w/ "cyst"> exam showed "shingles" left S2 distrib- mild rash, mild-mod pain by her account; we discussed Rx w/ Acyclovir + Medrol Dosepak, and OTC pain meds Prn (all symptoms resolved)   Past Surgical History  Procedure Date  . Abdominal hysterectomy   . Robotic assisted laparoscopic sacrocolpopexy 09/2009    Dr. Sherron Monday    Outpatient Encounter Prescriptions as of 04/06/2012  Medication Sig Dispense Refill  . ALPRAZolam (XANAX) 0.5 MG tablet Take 0.5 mg by mouth. 1/2 to 1 3 times a day for nerves       . atenolol (TENORMIN) 50 MG tablet Take 25 mg by mouth daily.        [name] dextromethorphan-guaiFENesin (MUCINEX DM) 30-600 MG per 12 hr tablet Take 1 tablet by mouth. 1-2 TIMES DAILY AS NEEDED       . Diphenhyd-Hydrocort-Nystatin  (FIRST-DUKES MOUTHWASH) SUSP 1 teaspoon by mouth gargle and swallow four times daily as needed  120 mL  5  . olmesartan-hydrochlorothiazide (BENICAR HCT) 40-25 MG per tablet Take 1 tablet by mouth daily.        [name] estradiol (ESTRACE) 0.1 MG/GM vaginal cream Place 2 g vaginally daily.      [name] omeprazole (PRILOSEC) 20 MG capsule Take 20 mg by mouth daily. 30 minutes prior to a meal        Allergies  Allergen Reactions  . Fish Allergy Hives  . Shellfish Allergy Hives  . Penicillins     REACTION: red rash    Current Medications, Allergies, Past Medical History, Past Surgical History, Family History, and Social History were reviewed in [employer] record.    Review of Systems        See HPI - all other systems neg except as noted...  The patient complains of decreased hearing, dyspnea on exertion, muscle weakness, difficulty walking, and depression.  The patient denies anorexia, fever, weight loss, weight gain, vision loss, hoarseness, chest pain, syncope, peripheral edema, prolonged cough, headaches, hemoptysis, abdominal pain, melena, hematochezia, severe indigestion/heartburn, hematuria, incontinence, suspicious skin lesions, transient blindness, unusual weight change, abnormal bleeding, enlarged lymph nodes, and angioedema.    Objective:   Physical Exam     WD, WN, 71 y/o BF in NAD... GENERAL:  Alert & oriented; pleasant & cooperative... she has mult trigger points bilat... HEENT:  Kayla [name]/AT, EOM-wnl, PERRLA, EACs-clear, TMs-wnl, NOSE-clear, THROAT-clear & wnl. NECK:  Supple w/ fairROM; no JVD; normal carotid impulses w/o bruits; no thyromegaly or nodules palpated; no lymphadenopathy. CHEST:  few scat rhonchi bilat without rales or consolidation... HEART:  Regular Rhythm; without murmurs/ rubs/ or gallops. ABDOMEN:  Soft & nontender; normal bowel sounds; no organomegaly or masses detected. EXT: without deformities, mild arthritic changes; no varicose veins/ +venous  insuffic/ no edema. NEURO:  CN's intact; no focal neuro deficits... DERM:  No lesions  noted; no rash etc...  RADIOLOGY DATA:  Reviewed in the EPIC EMR & discussed w/ the patient...  LABORATORY DATA:  Reviewed in the EPIC EMR & discussed w/ the patient...   Assessment & Plan:    HBP> on Atenolol & BenicarHCT;  Followed for Cards by [facility];  BP has been easily controlled when she takes meds regularly; reminded to take every day!     Ven Insuffic> on low sodium diet, elevation, support hose; wt=185# which is up 14# & offered to change diuretic to Lasix but she wants to discuss w/ DrHarwani first...     Chol> on Diet alone & managed by [facility]; FLP here 3/13 was ok x LDL 106.     DM> on Diet alone as well & her BS/ A1c are ok...     Divertics, Constip> on OTC laxatives as needed...     Vag prolapse> followed by Urology s/p surg...     DJD. FM, Somatic dysfunction> as above- and she is reminded of the importance of gentle stretching exercises etc...     Anxiety> on Alprazolam as needed & encouraged to use it more regularly...     Sickle Trait> aware- CBC is wnl...   Patient's Medications  New Prescriptions   No medications on file  Previous Medications   ALPRAZOLAM (XANAX) 0.5 MG TABLET    Take 0.5 mg by mouth. 1/2 to 1 3 times a day for nerves    ATENOLOL (TENORMIN) 50 MG TABLET    Take 25 mg by mouth daily.     DEXTROMETHORPHAN-GUAIFENESIN (MUCINEX DM) 30-600 MG PER 12 HR TABLET    Take 1 tablet by mouth. 1-2 TIMES DAILY AS NEEDED    DIPHENHYD-HYDROCORT-NYSTATIN (FIRST-DUKES MOUTHWASH) SUSP    1 teaspoon by mouth gargle and swallow four times daily as needed   ESTRADIOL (ESTRACE) 0.1 MG/GM VAGINAL CREAM    Place 2 g vaginally daily.   OLMESARTAN-HYDROCHLOROTHIAZIDE (BENICAR HCT) 40-25 MG PER TABLET    Take 1 tablet by mouth daily.     OMEPRAZOLE (PRILOSEC) 20 MG CAPSULE    Take 20 mg by mouth daily. 30 minutes prior to a meal  Modified Medications   No medications on file   Discontinued Medications   No medications on file

## 2012-04-06 NOTE — Patient Instructions (Addendum)
Today we updated your med list in our EPIC system...    Continue your current medications the same...  For the swelling:    Eliminate all the salt/ sodium from your diet...    Elevate your legs...    Wear support hose...  For the Gas/ bloating:    Try the antigas ingredient> SIMETHACONE...    Use Gas-X or MYLICON 4 times daily...    You may add in PHAZYME 125> one tab 4 times daily for lower gas...  Call for any questions...  Let's plan a follow up visit in 6 months.Marland KitchenMarland Kitchen

## 2012-04-25 ENCOUNTER — Telehealth: Payer: Self-pay | Admitting: Pulmonary Disease

## 2012-04-25 NOTE — Telephone Encounter (Signed)
Per SN---it is called viactiv  otc---this is a little chocolate square at the pharmacy.  i have called an lmom for the pt to call me back.

## 2012-04-25 NOTE — Telephone Encounter (Signed)
Called and spoke with pt and she stated that SN had told her of a calcium supplement that she could take that would not cause her to be constipated.  Pt stated that she could not remember the name of this medication and wanted to know if SN could tell her again.  SN please advise. Thanks  Allergies  Allergen Reactions  . Fish Allergy Hives  . Shellfish Allergy Hives  . Penicillins     REACTION: red rash

## 2012-04-26 NOTE — Telephone Encounter (Signed)
Pt returned call. I advised her of the OTC Viactiv. She stated that was all she needed and I will close this encounter. Kayla [name]

## 2012-06-08 ENCOUNTER — Telehealth: Payer: Self-pay | Admitting: Pulmonary Disease

## 2012-06-08 MED ORDER — FIRST-DUKES MOUTHWASH MT SUSP: OROMUCOSAL | Status: DC

## 2012-06-08 NOTE — Telephone Encounter (Signed)
Pt c/o thick, slimy saliva and wants to know if MMW would help this. She does have upper dentures and states that they don;t seem to fit properly anymore. She does not have any white patches or raw irritation in the mouth and has not checked with her dentist about this issue. SN, pls advise. Allergies  Allergen Reactions  . Fish Allergy Hives  . Shellfish Allergy Hives  . Penicillins     REACTION: red rash

## 2012-06-08 NOTE — Telephone Encounter (Signed)
lmtcb x1 for pt. rx has been sent 

## 2012-06-08 NOTE — Telephone Encounter (Signed)
Per SN---ok to fill the MMW  #4oz   1 tsp gargle and swallow four times daily as needed with 5 refills. thanks

## 2012-06-09 NOTE — Telephone Encounter (Signed)
Pt is aware. Nothing further was needed 

## 2012-09-07 ENCOUNTER — Encounter: Payer: Self-pay | Admitting: Pulmonary Disease

## 2012-09-07 ENCOUNTER — Ambulatory Visit (INDEPENDENT_AMBULATORY_CARE_PROVIDER_SITE_OTHER): Payer: Medicare Other | Admitting: Pulmonary Disease

## 2012-09-07 ENCOUNTER — Other Ambulatory Visit (INDEPENDENT_AMBULATORY_CARE_PROVIDER_SITE_OTHER): Payer: Medicare Other

## 2012-09-07 VITALS — BP 134/88 | HR 66 | Temp 97.4°F | Ht 67.0 in | Wt 193.0 lb

## 2012-09-07 DIAGNOSIS — K5900 Constipation, unspecified: Secondary | ICD-10-CM

## 2012-09-07 DIAGNOSIS — F411 Generalized anxiety disorder: Secondary | ICD-10-CM

## 2012-09-07 DIAGNOSIS — I10 Essential (primary) hypertension: Secondary | ICD-10-CM

## 2012-09-07 DIAGNOSIS — M1990 Unspecified osteoarthritis, unspecified site: Secondary | ICD-10-CM

## 2012-09-07 DIAGNOSIS — E7800 Pure hypercholesterolemia, unspecified: Secondary | ICD-10-CM

## 2012-09-07 DIAGNOSIS — D573 Sickle-cell trait: Secondary | ICD-10-CM

## 2012-09-07 DIAGNOSIS — I872 Venous insufficiency (chronic) (peripheral): Secondary | ICD-10-CM

## 2012-09-07 DIAGNOSIS — IMO0001 Reserved for inherently not codable concepts without codable children: Secondary | ICD-10-CM

## 2012-09-07 DIAGNOSIS — K5730 Diverticulosis of large intestine without perforation or abscess without bleeding: Secondary | ICD-10-CM

## 2012-09-07 DIAGNOSIS — R7309 Other abnormal glucose: Secondary | ICD-10-CM

## 2012-09-07 LAB — CBC WITH DIFFERENTIAL/PLATELET
Basophils Absolute: 0 10*3/uL (ref 0.0–0.1)
Eosinophils Absolute: 0.1 10*3/uL (ref 0.0–0.7)
HCT: 42.1 % (ref 36.0–46.0)
Hemoglobin: 14 g/dL (ref 12.0–15.0)
Lymphocytes Relative: 25.6 % (ref 12.0–46.0)
Lymphs Abs: 1.3 10*3/uL (ref 0.7–4.0)
MCHC: 33.3 g/dL (ref 30.0–36.0)
Neutro Abs: 3.2 10*3/uL (ref 1.4–7.7)
Platelets: 294 10*3/uL (ref 150.0–400.0)
RDW: 16.6 % — ABNORMAL HIGH (ref 11.5–14.6)

## 2012-09-07 LAB — HEPATIC FUNCTION PANEL
AST: 17 U/L (ref 0–37)
Alkaline Phosphatase: 82 U/L (ref 39–117)
Bilirubin, Direct: 0.1 mg/dL (ref 0.0–0.3)
Total Protein: 7.7 g/dL (ref 6.0–8.3)

## 2012-09-07 LAB — BASIC METABOLIC PANEL
CO2: 29 mEq/L (ref 19–32)
Calcium: 8.9 mg/dL (ref 8.4–10.5)
Potassium: 3.7 mEq/L (ref 3.5–5.1)
Sodium: 138 mEq/L (ref 135–145)

## 2012-09-07 LAB — LIPID PANEL
HDL: 45.4 mg/dL (ref >39.00)
Total CHOL/HDL Ratio: 4

## 2012-09-07 MED ORDER — SERTRALINE HCL 50 MG PO TABS: 50.0000 mg | ORAL_TABLET | Freq: Every day | ORAL | Status: DC

## 2012-09-07 MED ORDER — FIRST-DUKES MOUTHWASH MT SUSP: OROMUCOSAL | Status: DC

## 2012-09-07 NOTE — Patient Instructions (Addendum)
Today we updated your med list in our EPIC system...    Continue your current medications the same...    We reilled your Magic Mouthwash today...  For your constipation:    Remember to take the [facility]- one capful in water or juice twice daily...    And SENAKOT-S 2 tabs at bedtime...  For your depressed mood>     Try the new SERTRALINE 50mg  one daily in the eve...    If not maximally effective I will increase it to 100mg  so let me know how you are doing...  Today we did your follow up FASTING blood work...    We will contact you w/ the results when avail...  Call for any questions...  Let's plan a follow up visit in 6 months.Marland KitchenMarland Kitchen

## 2012-09-07 NOTE — Progress Notes (Signed)
Subjective:    Patient ID: Kayla [name], female    DOB: 16-Aug-1940, 72 y.o.   MRN: [id number]  HPI 72 y/o BF here for a follow up visit... she has mult med problems as noted below...  She has HBP treated by [facility] w/ ATENOLOL 50mg /d and BENICAR/HCT 40-25 daily... states her control has been good... he did her lab work and he wanted her to start CRESTOR which she says she takes only every once in awhile- true to her history of chronic non-compliance w/ medical therapy & she states she's afraid of meds since she has "so many reactions to things"...  ~  September 07, 2011:  64mo ROV & Kayla [name] has mult somatic complaints- feels dizzy, no energy, states she has trouble w/ meds- eg the MMW makes her sleepy & dizzy; she continues to have her CV problems managed by [facility];  We reviewed her meds, problem list, & labs> see below>> LABS 3/13:  FLP- ok x LDL=106 on Diet;  Chems- wnl w/ BS=102 A1c=6.1;  CBC= wnl;  TSH=1.28...  ~  April 06, 2012:  26mo ROV & Kayla [name] is c/o fatigue, no energy, swelling, nasal drainage, gas, etc> see below:     HBP> on Atenolol25 & BenicarHCT40-25; BP= 128/78 & still followed for Cards by [facility] & 8/13 Myoview at [facility] was neg...    Ven Insuffic> on low sodium diet, elevation, support hose; wt up to 185# (gain of 14#) & we reviewed NO SALT, elevation, TEDs...    Chol> on Diet alone & managed by [facility]; we do not have his labs...    DM> on Diet alone as well & her last BS in EPIC was 102 in [id number]; we reviewed diet, exercise, etc...    Divertics, Constip> on OTC laxatives as needed; notes swelling, bloating & we reviewed Gas-X, Mylicon, Phazyme...    Vag prolapse> followed by Urology & GYN on Estrace; she saw DrMacDiarmid 7/13- stress & urge incont, tried Myrbetriq...     DJD. FM, Somatic dysfunction> as above- and she is reminded of the importance of gentle stretching exercises etc; she notes that calcium constipates her 7 asked to try Viactiv...     Anxiety> on Alprazolam  as needed & encouraged to use it more regularly...    Sickle Trait> aware- last Hg= 15.3 in Mar2013... We reviewed prob list, meds, xrays and labs> see below for updates >> she declines the 2013 flu vaccine...  ~  September 07, 2012:  64mo ROV & Kayla [name] is depressed & wants to try medication- offered Sertraline 50mg  Qhs as a trial; We reviewed the following medical problems during today's office visit >>     HBP> on Atenolol25 & BenicarHCT40-25; BP= 134/88 & still followed for Cards by [facility] & 8/13 Myoview at [facility] was neg...    Ven Insuffic> on low sodium diet, elevation, support hose; she has VI & trace edema- needs to do better on sodium restriction...    Chol> on Diet alone & managed by [facility]; FLP here 3/14 shows TChol 180, TG 75, HDL 45, LDL 120; needs better low chol diet...    DM> on Diet alone; she has gained 8# up to 193#; Labs 3/14 shows BS=131, A1c=7.5, and she is rec to start Metformin 500mg /d...    Divertics, Constip> on Prilosec20 & OTC laxatives as needed; notes swelling, bloating & constip=> rec to take MiralaxBid & Senakot-S2Qhs...    Vag prolapse> followed by Urology & GYN on Estrace; she saw DrMacDiarmid 7/13- stress & urge  incont, tried Myrbetriq...     DJD, LBP, FM, Somatic dysfunction> she is reminded of the importance of gentle stretching exercises etc; she notes that calcium constipates her & asked to try Viactiv...     Anxiety> on Alprazolam0.5mg  as needed & encouraged to use it more regularly...    Sickle Trait> aware- last Hg= 15.3 in Mar2013... We reviewed prob list, meds, xrays and labs> see below for updates >> she declined the Flu vaccine LABS 3/14:  FLP- ok x LDL=120 on diet alone;  Chems- ok x BS=131 A1c=7.5;  CBC- wnl;  TSH=1.28;  VitD=30;  Mg=1.8.Marland KitchenMarland Kitchen She is rec to start Metformin 500mg /d and Vit D OTC supplement ~2000u daily...          Problem List:  HYPERTENSION (ICD-401.9) - on BENICAR/ HCT 40-25 daily ("sometimes I only take 1/2") + ATENOLOL 50mg - 1/2  tab/d... Mod HBP that is easy enough to control when she takes her meds regularly... DrHarwani has assumed management of her BP... ~  CXR 1/09 showed clear lungs, sl cardiomegaly w/o CHF... ~  she continues to have regular check ups w/ DrHarwani... ~  CXR 6/12 showed heart size wnl, clear lungs, NAD... ~  10/12:  BP today= 136/80, & denies HA, fatigue, visual changes, CP, palipit, dizziness, syncope, dyspnea, edema, etc...  ~  3/13:  BP= 130/100 & reminded to take meds regualarly & f/u w/ DrHarwani.. ~  8/13: Myoview by Maia Breslow at [facility] was neg- no ischemia, nor wall motion abn, EF=75% ~  3/14:  on Atenolol25 & BenicarHCT40-25; BP= 134/88 & still followed for Cards by [facility] & 8/13 Myoview at [facility] was neg.  VENOUS INSUFFICIENCY (ICD-459.81) - on low sodium diet, elevation, support hose & the Hct.  HYPERCHOLESTEROLEMIA, MILD (ICD-272.0 - followed & treated by [facility] on diet alone, we don't have his labs or notes. ~  FLP here 11/04 showed TChol 214, TG 122, HDL 44, LDL 145 ~  FLP here 3/08 showed TChol 164, TG 59, HDL 45, LDL 107 ~  FLP here 6/11 showed TChol 163, Tg 60, HDL 45, LDL 106... continue diet efforts. ~  She has labs done by DrWarwani (we do not have records from him)... ~  FLP here 3/13 on Diet alone showed TChol 174, TG 95, HDL 49, LDL 106 ~  FLP here 3/14 on Diet alone showed TChol 180, TG 75, HDL 45, LDL 120... Needs better diet, doesn't want meds.  DIABETES MELLITUS, BORDERLINE (ICD-790.29)) - diet controlled... BS all in the 100-110 range when last checked here (we don't have labs from [facility])... hx intol to Metformin in past... ~  lab 10/09 showed BS= 85 ~  12/10:  fingerstick BS here = 87 ~  labs here 6/11 showed BS= 97, A1c= 6.2.Marland KitchenMarland Kitchen continue diet Rx. ~  She has labs done by DrWarwani (we do not have records from him)... ~  Labs here 3/13 showed BS= 102, A1c= 6.1 ~  on Diet alone; she has gained 8# up to 193#; Labs 3/14 shows BS=131, A1c=7.5, and she is rec to start  Metformin 500mg /d.   DIVERTICULOSIS OF COLON (ICD-562.10) - colonoscopy 4/04 by DrBrodie showed divertics, f/u 42yr. CONSTIPATION (ICD-564.00) - we discussed Rx w/ MIRALAX Bid + SENAKOT-S 1-2 Qhs...  PROLAPSE OF VAGINAL VAULT AFTER HYSTERECTOMY (ICD-618.5) - evals by GYN & Urology- DrKimbrough, MacDiarmid, Borden... she has vault prolapse w/ cystocele, rectocele, enterocele & failed pessary Rx- s/p robotic sacrocolpopexy 4/11 by DrMacDiarmid & improved. ~  7/13: DrMacDiarmid treated her w/ Myrbetriq trial..[name]  Hx of PROTEINURIA (ICD-791.0) - she had renal eval DrFox 2/08 w/ impression of prob hypertensive nephrosclerosis... she was encouraged to take her meds regularly to try an acheive adeq BP control... ~  labs 6/11 showed no proteinuria...  DEGENERATIVE JOINT DISEASE (ICD-715.90) - she takes arthritis Tylenol + Aleve Prn... LBP w/ Lumbar Spondylosis > eval by DrHilts for Ortho... FIBROMYALGIA (ICD-729.1) SOMATIC DYSFUNCTION (ICD-739.9) - hx of mult somatic complaints w/ neuro eval by DrSteifel in 1995... nothing found. ~  MRI Lumbar spine 3/12 showed multilevel spondylosis w/ severe congenital & acquired canal & lat recess stenosis w/ advanced facet degen dis...  ANXIETY (ICD-300.00) - on ALPRAZOLAM 0.5mg - 1/2 - 1 tab Tid Prn...   PERS HX NONCOMPLIANCE W/MED TX (ICD-V15.81) - AS NOTED...  Hx of SICKLE-CELL TRAIT (ICD-282.5) - pt reports + test for sickle trait in the past. ~  labs 10/09 showed Hg= 14.4 ~  labs 6/11 showed Hg= 13.4, MCV= 87 ~  Labs 3/13 here showed Hg= 15.3, MCV= 88  Hx SHINGLES >> presented w/ pain left side of buttock 12/10 w/ "cyst"> exam showed "shingles" left S2 distrib- mild rash, mild-mod pain by her account; we discussed Rx w/ Acyclovir + Medrol Dosepak, and OTC pain meds Prn (all symptoms resolved)   Past Surgical History  Procedure Laterality Date  . Abdominal hysterectomy    . Robotic assisted laparoscopic sacrocolpopexy  09/2009    Dr. Sherron Monday     Outpatient Encounter Prescriptions as of 09/07/2012  Medication Sig Dispense Refill  . ALPRAZolam (XANAX) 0.5 MG tablet Take 0.5 mg by mouth. 1/2 to 1 3 times a day for nerves       . atenolol (TENORMIN) 50 MG tablet Take 25 mg by mouth daily.        [name] dextromethorphan-guaiFENesin (MUCINEX DM) 30-600 MG per 12 hr tablet Take 1 tablet by mouth. 1-2 TIMES DAILY AS NEEDED       . Diphenhyd-Hydrocort-Nystatin (FIRST-DUKES MOUTHWASH) SUSP 1 teaspoon by mouth gargle and swallow four times daily as needed  120 mL  5  . olmesartan-hydrochlorothiazide (BENICAR HCT) 40-25 MG per tablet Take 1 tablet by mouth daily.        [name] estradiol (ESTRACE) 0.1 MG/GM vaginal cream Place 2 g vaginally daily.      [name] omeprazole (PRILOSEC) 20 MG capsule Take 20 mg by mouth daily. 30 minutes prior to a meal       No facility-administered encounter medications on file as of 09/07/2012.    Allergies  Allergen Reactions  . Fish Allergy Hives  . Shellfish Allergy Hives  . Penicillins     REACTION: red rash    Current Medications, Allergies, Past Medical History, Past Surgical History, Family History, and Social History were reviewed in [employer] record.    Review of Systems        See HPI - all other systems neg except as noted...  The patient complains of decreased hearing, dyspnea on exertion, muscle weakness, difficulty walking, and depression.  The patient denies anorexia, fever, weight loss, weight gain, vision loss, hoarseness, chest pain, syncope, peripheral edema, prolonged cough, headaches, hemoptysis, abdominal pain, melena, hematochezia, severe indigestion/heartburn, hematuria, incontinence, suspicious skin lesions, transient blindness, unusual weight change, abnormal bleeding, enlarged lymph nodes, and angioedema.    Objective:   Physical Exam     WD, WN, 71 y/o BF in NAD... GENERAL:  Alert & oriented; pleasant & cooperative... she has mult trigger points bilat... HEENT:   [place]/AT, EOM-wnl, PERRLA,  EACs-clear, TMs-wnl, NOSE-clear, THROAT-clear & wnl. NECK:  Supple w/ fairROM; no JVD; normal carotid impulses w/o bruits; no thyromegaly or nodules palpated; no lymphadenopathy. CHEST:  few scat rhonchi bilat without rales or consolidation... HEART:  Regular Rhythm; without murmurs/ rubs/ or gallops. ABDOMEN:  Soft & nontender; normal bowel sounds; no organomegaly or masses detected. EXT: without deformities, mild arthritic changes; no varicose veins/ +venous insuffic/ no edema. NEURO:  CN's intact; no focal neuro deficits... DERM:  No lesions noted; no rash etc...  RADIOLOGY DATA:  Reviewed in the EPIC EMR & discussed w/ the patient...  LABORATORY DATA:  Reviewed in the EPIC EMR & discussed w/ the patient...   Assessment & Plan:    HBP> on Atenolol & BenicarHCT;  Followed for Cards by [facility];  BP has been easily controlled when she takes meds regularly; reminded to take every day!     Ven Insuffic> on low sodium diet, elevation, support hose; wt=193# which is up 8# & offered to change diuretic to Lasix but she wants to discuss w/ DrHarwani first...     Chol> on Diet alone & managed by [facility]; FLP here 3/14 was ok x LDL 120.     DM> on Diet alone as well but BS=131, A1c=7.5 7 she is rec to start Metformin 500mg /d...     Divertics, Constip> on OTC laxatives as needed...     Vag prolapse> followed by Urology s/p surg...     DJD. FM, Somatic dysfunction> as above- and she is reminded of the importance of gentle stretching exercises etc...     Anxiety> on Alprazolam as needed & encouraged to use it more regularly...     Sickle Trait> aware- CBC is wnl...   Patient's Medications  New Prescriptions   METFORMIN (GLUCOPHAGE) 500 MG TABLET    Take 1 tablet (500 mg total) by mouth daily with breakfast.   SERTRALINE (ZOLOFT) 50 MG TABLET    Take 1 tablet (50 mg total) by mouth daily.  Previous Medications   ALPRAZOLAM (XANAX) 0.5 MG TABLET    Take 0.5 mg  by mouth. 1/2 to 1 3 times a day for nerves    ATENOLOL (TENORMIN) 50 MG TABLET    Take 25 mg by mouth daily.     DEXTROMETHORPHAN-GUAIFENESIN (MUCINEX DM) 30-600 MG PER 12 HR TABLET    Take 1 tablet by mouth. 1-2 TIMES DAILY AS NEEDED    ESTRADIOL (ESTRACE) 0.1 MG/GM VAGINAL CREAM    Place 2 g vaginally daily.   MULTIPLE VITAMINS-MINERALS (MULTIVITAMIN & MINERAL PO)    Take 1 tablet by mouth daily.   OLMESARTAN-HYDROCHLOROTHIAZIDE (BENICAR HCT) 40-25 MG PER TABLET    Take 1 tablet by mouth daily.     OMEPRAZOLE (PRILOSEC) 20 MG CAPSULE    Take 20 mg by mouth daily. 30 minutes prior to a meal  Modified Medications   Modified Medication Previous Medication   DIPHENHYD-HYDROCORT-NYSTATIN (FIRST-DUKES MOUTHWASH) SUSP Diphenhyd-Hydrocort-Nystatin (FIRST-DUKES MOUTHWASH) SUSP      1 teaspoon by mouth gargle and swallow four times daily as needed    1 teaspoon by mouth gargle and swallow four times daily as needed  Discontinued Medications   No medications on file

## 2012-09-08 ENCOUNTER — Other Ambulatory Visit: Payer: Self-pay | Admitting: Pulmonary Disease

## 2012-09-08 MED ORDER — METFORMIN HCL 500 MG PO TABS: 500.0000 mg | ORAL_TABLET | Freq: Every day | ORAL | Status: DC

## 2012-09-11 ENCOUNTER — Telehealth: Payer: Self-pay | Admitting: Pulmonary Disease

## 2012-09-11 ENCOUNTER — Encounter: Payer: Self-pay | Admitting: Internal Medicine

## 2012-09-11 NOTE — Telephone Encounter (Signed)
I spoke with pt. She stated she spoke with leigh on Friday. she is going to pick up the metformin today. She needed nothing further

## 2012-10-05 ENCOUNTER — Telehealth: Payer: Self-pay | Admitting: Pulmonary Disease

## 2012-10-05 ENCOUNTER — Encounter: Payer: Self-pay | Admitting: Internal Medicine

## 2012-10-05 MED ORDER — GLUCOSE BLOOD VI STRP: ORAL_STRIP | Status: DC

## 2012-10-05 NOTE — Telephone Encounter (Signed)
Called and spoke with pt and she stated that she needs test strips sent in for the optium blood sugar machine.  These strips have been sent in for her and nothing further is needed.

## 2012-10-06 ENCOUNTER — Ambulatory Visit: Payer: Medicare Other | Admitting: Pulmonary Disease

## 2012-10-10 ENCOUNTER — Telehealth: Payer: Self-pay | Admitting: Pulmonary Disease

## 2012-10-10 MED ORDER — GLUCOSE BLOOD VI STRP: ORAL_STRIP | Status: DC

## 2012-10-10 NOTE — Telephone Encounter (Signed)
Rx has been resent to her pharmacy. Pt is aware.

## 2012-10-17 ENCOUNTER — Telehealth: Payer: Self-pay | Admitting: Pulmonary Disease

## 2012-10-17 MED ORDER — GLUCOSE BLOOD VI STRP: ORAL_STRIP | Status: DC

## 2012-10-17 NOTE — Telephone Encounter (Signed)
I spoke with scott and gave VO. Nothing further was needed

## 2012-10-18 ENCOUNTER — Telehealth: Payer: Self-pay | Admitting: Pulmonary Disease

## 2012-10-18 NOTE — Telephone Encounter (Signed)
SN is aware and nothing further is needed. 

## 2012-10-18 NOTE — Telephone Encounter (Signed)
Will forward to SN to he is aware of nurse's recs and see if any changes need to be made Please advise thanks

## 2012-11-17 ENCOUNTER — Encounter: Payer: Self-pay | Admitting: Internal Medicine

## 2012-11-17 ENCOUNTER — Ambulatory Visit (AMBULATORY_SURGERY_CENTER): Payer: Medicare Other | Admitting: *Deleted

## 2012-11-17 VITALS — Ht 66.5 in | Wt 184.0 lb

## 2012-11-17 DIAGNOSIS — Z1211 Encounter for screening for malignant neoplasm of colon: Secondary | ICD-10-CM

## 2012-11-17 MED ORDER — MOVIPREP 100 G PO SOLR: ORAL | Status: DC

## 2012-11-29 ENCOUNTER — Telehealth: Payer: Self-pay | Admitting: Internal Medicine

## 2012-11-29 NOTE — Telephone Encounter (Signed)
Returned call to patient and advised her she can take an allergy medicine.

## 2012-12-01 ENCOUNTER — Ambulatory Visit (AMBULATORY_SURGERY_CENTER): Payer: Medicare Other | Admitting: Internal Medicine

## 2012-12-01 ENCOUNTER — Encounter: Payer: Self-pay | Admitting: Internal Medicine

## 2012-12-01 VITALS — BP 159/71 | HR 72 | Temp 97.7°F | Resp 26 | Ht 66.5 in | Wt 184.0 lb

## 2012-12-01 DIAGNOSIS — Z1211 Encounter for screening for malignant neoplasm of colon: Secondary | ICD-10-CM

## 2012-12-01 DIAGNOSIS — D126 Benign neoplasm of colon, unspecified: Secondary | ICD-10-CM

## 2012-12-01 MED ORDER — SODIUM CHLORIDE 0.9 % IV SOLN: 500.0000 mL | INTRAVENOUS | Status: DC

## 2012-12-01 NOTE — Op Note (Signed)
[facility] [address] [place], [address]   COLONOSCOPY PROCEDURE REPORT  PATIENT: Kayla, [name]  MR#: [id number] BIRTHDATE: May 31, 1941 , 72  yrs. old GENDER: Female ENDOSCOPIST: Hart Carwin, MD REFERRED BY:  Alroy Dust, M.D. PROCEDURE DATE:  12/01/2012 PROCEDURE:   Colonoscopy with cold biopsy polypectomy ASA CLASS:   Class II INDICATIONS:Average risk patient for colon cancer and prior colonoscopy 1987, 2004. MEDICATIONS: MAC sedation, administered by CRNA and Propofol (Diprivan) 170 mg IV  DESCRIPTION OF PROCEDURE:   After the risks and benefits and of the procedure were explained, informed consent was obtained.  A digital rectal exam revealed no abnormalities of the rectum.    The LB [id number]  endoscope was introduced through the anus and advanced to the cecum, which was identified by both the appendix and ileocecal valve .  The quality of the prep was good, using MoviPrep .  The instrument was then slowly withdrawn as the colon was fully examined.     COLON FINDINGS: A smooth sessile polyp ranging between 3-40mm in size was found in the sigmoid colon.  A polypectomy was performed with cold forceps.  The resection was complete and the polyp tissue was completely retrieved.   Mild diverticulosis was noted in the ascending colon.     Retroflexed views revealed no abnormalities. The scope was then withdrawn from the patient and the procedure completed.  COMPLICATIONS: There were no complications. ENDOSCOPIC IMPRESSION: Sessile polyp ranging between 3-37mm in size was found in the sigmoid colon; polypectomy was performed with cold forceps mild diverticulosis of the ascending colon  RECOMMENDATIONS: 1.  Await pathology results 2.  High fiber diet   REPEAT EXAM: In 10 year(s)  for Colonoscopy.  cc:  _______________________________ eSignedHart Carwin, MD 12/01/2012 9:05 AM

## 2012-12-01 NOTE — Progress Notes (Signed)
Awake, to recovery, report given, VSS

## 2012-12-01 NOTE — Progress Notes (Signed)
Patient did not have preoperative order for IV antibiotic SSI prophylaxis. (G8918)  Patient did not experience any of the following events: a burn prior to discharge; a fall within the facility; wrong site/side/patient/procedure/implant event; or a hospital transfer or hospital admission upon discharge from the facility. (G8907)  

## 2012-12-01 NOTE — Progress Notes (Signed)
Called to room to assist during endoscopic procedure.  Patient ID and intended procedure confirmed with present staff. Received instructions for my participation in the procedure from the performing physician.  

## 2012-12-01 NOTE — Patient Instructions (Addendum)

## 2012-12-04 ENCOUNTER — Telehealth: Payer: Self-pay | Admitting: *Deleted

## 2012-12-04 NOTE — Telephone Encounter (Signed)
  Follow up Call-  Call back number 12/01/2012  Post procedure Call Back phone  # [phone]  Permission to leave phone message Yes     Patient questions:  Do you have a fever, pain , or abdominal swelling? yes Pain Score  0 *  Have you tolerated food without any problems? no  Have you been able to return to your normal activities? yes  Do you have any questions about your discharge instructions: Diet   no Medications  no Follow up visit  no  Do you have questions or concerns about your Care? no  Actions: * If pain score is 4 or above: No action needed, pain <4. Patient ate broccoli and got bloated. No pain.

## 2012-12-05 ENCOUNTER — Encounter: Payer: Self-pay | Admitting: Internal Medicine

## 2013-04-26 ENCOUNTER — Telehealth: Payer: Self-pay | Admitting: Pulmonary Disease

## 2013-04-26 NOTE — Telephone Encounter (Signed)
I spoke with pt. She reports she has been having problems with her gums when she had her teeth pulled. I advised her she needed to contact her dentists to have them take a loot at it for an appt. Nothing further needed

## 2013-05-14 ENCOUNTER — Telehealth: Payer: Self-pay | Admitting: Pulmonary Disease

## 2013-05-14 MED ORDER — LEVOFLOXACIN 500 MG PO TABS: 500.0000 mg | ORAL_TABLET | Freq: Every day | ORAL | Status: DC

## 2013-05-14 NOTE — Telephone Encounter (Signed)
Per SN--  levaquin 500 mg  #7  1 daily Align once daily mucinex 600 mg  2 po bid Increase fluids If she wants cough syrup---hycodan #4oz  1 tsp every 6 hours as needed for cough  No openings today---lets try these meds first and if not getting better will need to be seen in the ER.  thanks

## 2013-05-14 NOTE — Telephone Encounter (Signed)
Spoke with the pt and notified of recs per SN She states that she does not want the cough syrup b/c she does not want to come and pick up rx  I have sent levaquin to pharm  Nothing more needed per pt

## 2013-05-14 NOTE — Telephone Encounter (Signed)
Pt c/o sinus drainage (brown), prod cough (clear), SOB with cough, chest congestion.  Denies fever.  Taking Coricidin HBP.  [facility] [place].  Please advise. Allergies  Allergen Reactions  . Fish Allergy Hives  . Shellfish Allergy Hives  . Penicillins     REACTION: red rash

## 2013-07-09 ENCOUNTER — Emergency Department (HOSPITAL_COMMUNITY)
Admission: EM | Admit: 2013-07-09 | Discharge: 2013-07-09 | Disposition: A | Discharge status: Home / Self Care | Payer: Medicare Other | Source: Home / Self Care | Attending: Emergency Medicine | Admitting: Emergency Medicine

## 2013-07-09 ENCOUNTER — Encounter (HOSPITAL_COMMUNITY): Payer: Self-pay | Admitting: Emergency Medicine

## 2013-07-09 ENCOUNTER — Ambulatory Visit (HOSPITAL_COMMUNITY)
Admit: 2013-07-09 | Discharge: 2013-07-09 | Disposition: A | Discharge status: Home / Self Care | Payer: Medicare Other | Attending: Emergency Medicine | Admitting: Emergency Medicine

## 2013-07-09 DIAGNOSIS — M79609 Pain in unspecified limb: Secondary | ICD-10-CM | POA: Insufficient documentation

## 2013-07-09 MED ORDER — ACETAMINOPHEN 325 MG PO TABS
ORAL_TABLET | ORAL | Status: AC
  Filled 2013-07-09: qty 2

## 2013-07-09 MED ORDER — TRAMADOL HCL 50 MG PO TABS: 100.0000 mg | ORAL_TABLET | Freq: Three times a day (TID) | ORAL | Status: DC | PRN

## 2013-07-09 MED ORDER — ACETAMINOPHEN 325 MG PO TABS
650.0000 mg | ORAL_TABLET | Freq: Once | ORAL | Status: AC
  Administered 2013-07-09: 650 mg via ORAL

## 2013-07-09 MED ORDER — GABAPENTIN 100 MG PO CAPS: ORAL_CAPSULE | ORAL | Status: DC

## 2013-07-09 NOTE — Discharge Instructions (Signed)
Apply Capsiacin cream to leg 3 times daily.   Diabetic Neuropathy Diabetic neuropathy is a nerve disease or nerve damage that is caused by diabetes mellitus. About half of all people with diabetes mellitus have some form of nerve damage. Nerve damage is more common in those who have had diabetes mellitus for many years and who generally have not had good control of their blood sugar (glucose) level. Diabetic neuropathy is a common complication of diabetes mellitus. There are three more common types of diabetic neuropathy and a fourth type that is less common and less understood:   Peripheral neuropathy This is the most common type of diabetic neuropathy. It causes damage to the nerves of the feet and legs first and then eventually the hands and arms.The damage affects the ability to sense touch.  Autonomic neuropathy This type causes damage to the autonomic nervous system, which controls the following functions:  Heartbeat.  Body temperature.  Blood pressure.  Urination.  Digestion.  Sweating.  Sexual function.  Focal neuropathy Focal neuropathy can be painful and unpredictable and occurs most often in older adults with diabetes mellitus. It involves a specific nerve or one area and often comes on suddenly. It usually does not cause long-term problems.  Radiculoplexus neuropathy Sometimes called lumbosacral radiculoplexus neuropathy, radiculoplexus neuropathy affects the nerves of the thighs, hips, buttocks, or legs. It is more common in people with type 2 diabetes mellitus and in older men. It is characterized by debilitating pain, weakness, and atrophy, usually in the thigh muscles. CAUSES  The cause of peripheral, autonomic, and focal neuropathies is diabetes mellitus that is uncontrolled and high glucose levels. The cause of radiculoplexus neuropathy is unknown. However, it is thought to be caused by inflammation related to uncontrolled glucose levels. SIGNS AND SYMPTOMS  Peripheral  Neuropathy Peripheral neuropathy develops slowly over time. When the nerves of the feet and legs no longer work there may be:   Burning, stabbing, or aching pain in the legs or feet.  Inability to feel pressure or pain in your feet. This can lead to:  Thick calluses over pressure areas.  Pressure sores.  Ulcers.  Foot deformities.  Reduced ability to feel temperature changes.  Muscle weakness. Autonomic Neuropathy The symptoms of autonomic neuropathy vary depending on which nerves are affected. Symptoms may include:  Problems with digestion, such as:  Feeling sick to your stomach (nausea).  Vomiting.  Bloating.  Constipation.  Diarrhea.  Abdominal pain.  Difficulty with urination. This occurs if you lose your ability to sense when your bladder is full. Problems include:  Urine leakage (incontinence).  Inability to empty your bladder completely (retention).  Rapid or irregular heartbeat (palpitations).  Blood pressure drops when you stand up (orthostatic hypotension). When you stand up you may feel:  Dizzy.  Weak.  Faint.  In men, inability to attain and maintain an erection.  In women, vaginal dryness and problems with decreased sexual desire and arousal.  Problems with body temperature regulation.  Increased or decreased sweating. Focal Neuropathy  Abnormal eye movements or abnormal alignment of both eyes.  Weakness in the wrist.  Foot drop. This results in an inability to lift the foot properly and abnormal walking or foot movement.  Paralysis on one side of your face (Bell palsy).  Chest or abdominal pain. Radiculoplexus Neuropathy  Sudden, severe pain in your hip, thigh, or buttocks.  Weakness and wasting of thigh muscles.  Difficulty rising from a seated position.  Abdominal swelling.  Unexplained weight loss (usually  more than 10 lb [4.5 kg]). DIAGNOSIS  Peripheral Neuropathy Your senses may be tested. Sensory function testing  can be done with:  A light touch using a monofilament.  A vibration with tuning fork.  A sharp sensation with a pin prick. Other tests that can help diagnose neuropathy are:  Nerve conduction velocity. This test checks the transmission of an electrical current through a nerve.  Electromyography. This shows how muscles respond to electrical signals transmitted by nearby nerves.  Quantitative sensory testing. This is used to assess how your nerves respond to vibrations and changes in temperature. Autonomic Neuropathy Diagnosis is often based on reported symptoms. Tell your health care provider if you experience:   Dizziness.   Constipation.   Diarrhea.   Inappropriate urination or inability to urinate.   Inability to get or maintain an erection.  Tests that may be done include:   Electrocardiography or Holter monitor. These are tests that can help show problems with the heart rate or heart rhythm.   An X-ray exam may be done. Focal Neuropathy Diagnosis is made based on your symptoms and what your health care provider finds during your exam. Other tests may be done. They may include:  Nerve conduction velocities. This checks the transmission of electrical current through a nerve.  Electromyography. This shows how muscles respond to electrical signals transmitted by nearby nerves.  Quantitative sensory testing. This test is used to assess how your nerves respond to vibration and changes in temperature. Radiculoplexus Neuropathy  Often the first thing is to eliminate any other issue or problems that might be the cause, as there is no stick test for diagnosis.  X-ray exam of your spine and lumbar region.  Spinal tap to rule out cancer.  MRI to rule out other lesions. TREATMENT  Once nerve damage occurs, it cannot be reversed. The goal of treatment is to keep the disease or nerve damage from getting worse and affecting more nerve fibers. Controlling your blood glucose  level is the key. Most people with radiculoplexus neuropathy see at least a partial improvement over time. You will need to keep your blood glucose and HbA1c levels in the target range determined by your health care provider. Things that help control blood glucose levels include:   Blood glucose monitoring.   Meal planning.   Physical activity.   Diabetes medicine.  Over time, maintaining lower blood glucose levels helps lessen symptoms. Sometimes, prescription pain medicine is needed. HOME CARE INSTRUCTIONS:  Do not smoke.  Keep your blood glucose level in the range that you and your health care provider have determined acceptable for you.  Keep your blood pressure level in the range that you and your health care provider have determined acceptable for you.  Eat a well-balanced diet.  Be active every day.  Check your feet every day. SEEK MEDICAL CARE IF:   You have burning, stabbing, or aching pain in the legs or feet.  You are unable to feel pressure or pain in your feet.  You develop problems with digestion such as:  Nausea.  Vomiting.  Bloating.  Constipation.  Diarrhea.  Abdominal pain.  You have difficulty with urination, such as:  Incontinence.  Retention.  You have palpitations.  You develop orthostatic hypotension. When you stand up you may feel:  Dizzy.  Weak.  Faint.  You cannot attain and maintain an erection (in men).  You have vaginal dryness and problems with decreased sexual desire and arousal (in women).  You have severe  pain in your thighs, legs, or buttocks.  You have unexplained weight loss. Document Released: 08/16/2001 Document Revised: 03/28/2013 Document Reviewed: 11/16/2012 [facility] Patient Information 2014 [place].

## 2013-07-09 NOTE — ED Provider Notes (Signed)
Chief Complaint:   Chief Complaint  Patient presents with  . Leg Pain    History of Present Illness:   Kayla [name] is a 73 year old female with diabetes who has had a several year history of pain in her right leg. This has been worse the past 2-3 days. The pain is localized over the lateral aspect of the lower leg. There has been no swelling. The area burns and tingles. The pain extends from the knee to the ankle. It's worse if she stands or moves and better she sits down. Her leg feels a little bit weak and sometimes feels as though will give out on her. She denies any back pain or bladder or bowel dysfunction. She thinks her diabetes under fairly good control. She takes metformin 500 mg once a day at breakfast.  Review of Systems:  Other than noted above, the patient denies any of the following symptoms: Systemic:  No fever, chills, sweats, weight gain or loss. Respiratory:  No coughing, wheezing, or shortness of breath. Cardiac:  No chest pain, tightness, pressure or syncope. GI:  No abdominal pain, swelling, distension, nausea, or vomiting. GU:  No dysuria, frequency, or hematuria. Ext:  No joint pain, muscle pain, or weakness. Skin:  No rash or itching. Neuro:  No paresthesias.  [place]:  Past medical history, family history, social history, meds, and allergies were reviewed. She is allergic to penicillin. Current meds include Xanax, atenolol, Estrace, metformin, Benicar/HCTZ, Prilosec, and Zoloft. She has a history of hypertension, venous insufficiency, type 2 diabetes, hypercholesterolemia, fibromyalgia, anxiety, sickle cell trait, and gastroesophageal reflux.  Physical Exam:   Vital signs:  BP 161/90  Pulse 70  Temp(Src) 97.9 F (36.6 C) (Oral)  Resp 16  SpO2 98% Gen:  Alert, oriented, in no distress. Neck:  No tenderness, adenopathy, or JVD. Lungs:  Breath sounds clear and equal bilaterally.  No rales, rhonchi or wheezes. Heart:  Regular rhythm, no gallops or  murmers. Abdomen:  Soft, nontender, no organomegaly or mass. Ext:  She has mild calf tenderness to palpation and a positive Homans sign. There is no swelling, distended blood vessels, or pitting edema. Pedal pulse was not palpable, toes were warm and pink and had good capillary refill. Muscle strength was normal, sensation was intact to light touch, joint survey or reveals normal ankle and knee joints without any pain or swelling. Neuro:  Alert and oriented times 3.  No muscle weakness.  Sensation intact to light touch. Skin:  Warm and dry.  No rash or skin lesions.  Radiology:  A venous Doppler was negative for DVT.  Assessment:  The encounter diagnosis was LEG PAIN.  Differential diagnosis includes diabetic peripheral neuropathy, lumbar radiculopathy, or peripheral arterial disease. She will need further workup for this, to be orchestrated by her primary care physician.  Plan:   1.  Meds:  The following meds were prescribed:   Discharge Medication List as of 07/09/2013 12:47 PM    START taking these medications   Details  gabapentin (NEURONTIN) 100 MG capsule 1 daily for 3 days, 1 BID for 3 days, then 1 TID., Normal    traMADol (ULTRAM) 50 MG tablet Take 2 tablets (100 mg total) by mouth every 8 (eight) hours as needed., Starting 07/09/2013, Until Discontinued, Normal        2.  Patient Education/Counseling:  The patient was given appropriate handouts, self care instructions, and instructed in symptomatic relief. Suggested capsicin cream applied 3 times daily.  3.  Follow up:  The patient was told to follow up if no better in 3 to 4 days, if becoming worse in any way, and given some red flag symptoms such as any change in color of her feet or toes, new neurological symptoms, or worsening pain which would prompt immediate return.  Follow up with Dr. Lenna Gilford later on this week.     Harden Mo, MD 07/09/13 1322

## 2013-07-09 NOTE — Progress Notes (Addendum)
*  PRELIMINARY RESULTS* Vascular Ultrasound Right lower extremity venous duplex has been completed.  Preliminary findings: no evidence of DVT or baker's cyst.   Attempted call report to Dr. Jake Michaelis at [phone] with no answer and no voice mail. Tried calling main line at [phone] with no answer and voice mail box full.     Landry Mellow, RDMS, RVT  07/09/2013, 11:48 AM

## 2013-07-09 NOTE — ED Notes (Signed)
C/o right leg pain x 3 days.  Pt states that she fell last night and made leg pain worse.  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  Feels like leg is going to give away.  Pt has tried warm compresses and celebrex with mild relief.

## 2013-07-10 ENCOUNTER — Telehealth: Payer: Self-pay | Admitting: Pulmonary Disease

## 2013-07-10 NOTE — Telephone Encounter (Signed)
Spoke with pt. Appt scheduled with TP as SN did not have anything. Nothing further needed

## 2013-07-12 ENCOUNTER — Encounter: Payer: Self-pay | Admitting: Adult Health

## 2013-07-12 ENCOUNTER — Ambulatory Visit (INDEPENDENT_AMBULATORY_CARE_PROVIDER_SITE_OTHER): Payer: Medicare Other | Admitting: Adult Health

## 2013-07-12 VITALS — BP 132/72 | HR 64 | Temp 98.1°F | Ht 66.5 in | Wt 191.8 lb

## 2013-07-12 DIAGNOSIS — M549 Dorsalgia, unspecified: Secondary | ICD-10-CM

## 2013-07-12 DIAGNOSIS — M25569 Pain in unspecified knee: Secondary | ICD-10-CM

## 2013-07-12 MED ORDER — FIRST-DUKES MOUTHWASH MT SUSP: OROMUCOSAL | Status: DC

## 2013-07-12 NOTE — Patient Instructions (Addendum)
Ibuprofen 200mg  2 tabs Twice daily  For 5 days , take with food  May use ice to knee x 15 min Three times a day  As needed   Continue on neurontin 100mg  , increase as directed to 100mg  Three times a day  . Refer to ortho for knee and back pain .  follow up Dr. Lenna Gilford  In 2 months and As needed   Please contact office for sooner follow up if symptoms do not improve or worsen or seek emergency care

## 2013-07-12 NOTE — Addendum Note (Signed)
Addended by: Parke Poisson E on: 07/12/2013 11:19 AM   Modules accepted: Orders

## 2013-07-12 NOTE — Progress Notes (Signed)
Subjective:    Patient ID: Kayla [name], female    DOB: 03-20-1941, 73 y.o.   MRN: [id number]  HPI 73 y/o BF  07/12/2013 ER follow up  Seen ER 1/19 for right knee /Lower leg.  Venous doppler was neg for DVt.  Felt to have ?DM neuropathy , started on neurontin .  Says Right knee and LE with pain x 1 week with burning pain, catch in knee. And throbbing pain in knee. Worse with standing. Feels knee will give out.  Has known back pain w/ MRI Lumbar spine 3/12 showed multilevel spondylosis w/ severe congenital & acquired canal & lat recess stenosis w/ advanced facet degen dis.. Followed by Dr. Derry Lory.  .Was started on Neurontin and tramadol . Leg does feel some better. On neurontin 100mg  Three times a day  .  No xray done in ER .  No chest pain , n/v/d, abd pain or edema.  Had fall 4 days ago on right side. No LOC .           Problem List:  HYPERTENSION (ICD-401.9) - on BENICAR/ HCT 40-25 daily ("sometimes I only take 1/2") + ATENOLOL 50mg - 1/2 tab/d... Mod HBP that is easy enough to control when she takes her meds regularly... DrHarwani has assumed management of her BP... ~  CXR 1/09 showed clear lungs, sl cardiomegaly w/o CHF... ~  she continues to have regular check ups w/ DrHarwani... ~  CXR 6/12 showed heart size wnl, clear lungs, NAD... ~  10/12:  BP today= 136/80, & denies HA, fatigue, visual changes, CP, palipit, dizziness, syncope, dyspnea, edema, etc...  ~  3/13:  BP= 130/100 & reminded to take meds regualarly & f/u w/ DrHarwani.. ~  8/13: Myoview by Tedra Senegal at [facility] was neg- no ischemia, nor wall motion abn, EF=75% ~  3/14:  on Atenolol25 & BenicarHCT40-25; BP= 134/88 & still followed for Cards by [facility] & 8/13 Myoview at [facility] was neg.  VENOUS INSUFFICIENCY (ICD-459.81) - on low sodium diet, elevation, support hose & the Hct.  HYPERCHOLESTEROLEMIA, MILD (ICD-272.0 - followed & treated by [facility] on diet alone, we don't have his labs or notes. ~  FLP here 11/04 showed  TChol 214, TG 122, HDL 44, LDL 145 ~  FLP here 3/08 showed TChol 164, TG 59, HDL 45, LDL 107 ~  FLP here 6/11 showed TChol 163, Tg 60, HDL 45, LDL 106... continue diet efforts. ~  She has labs done by DrWarwani (we do not have records from him)... ~  FLP here 3/13 on Diet alone showed TChol 174, TG 95, HDL 49, LDL 106 ~  FLP here 3/14 on Diet alone showed TChol 180, TG 75, HDL 45, LDL 120... Needs better diet, doesn't want meds.  DIABETES MELLITUS, BORDERLINE (ICD-790.29)) - diet controlled... BS all in the 100-110 range when last checked here (we don't have labs from [facility])... hx intol to Metformin in past... ~  lab 10/09 showed BS= 85 ~  12/10:  fingerstick BS here = 87 ~  labs here 6/11 showed BS= 97, A1c= 6.2.Marland KitchenMarland Kitchen continue diet Rx. ~  She has labs done by DrWarwani (we do not have records from him)... ~  Labs here 3/13 showed BS= 102, A1c= 6.1 ~  on Diet alone; she has gained 8# up to 193#; Labs 3/14 shows BS=131, A1c=7.5, and she is rec to start Metformin 500mg /d.   DIVERTICULOSIS OF COLON (ICD-562.10) - colonoscopy 4/04 by DrBrodie showed divertics, f/u 15yr. CONSTIPATION (ICD-564.00) - we discussed Rx  w/ MIRALAX Bid + SENAKOT-S 1-2 Qhs...  PROLAPSE OF VAGINAL VAULT AFTER HYSTERECTOMY (ICD-618.5) - evals by GYN & Urology- DrKimbrough, MacDiarmid, Borden... she has vault prolapse w/ cystocele, rectocele, enterocele & failed pessary Rx- s/p robotic sacrocolpopexy 4/11 by DrMacDiarmid & improved. ~  7/13: DrMacDiarmid treated her w/ Myrbetriq trial...  Hx of PROTEINURIA (ICD-791.0) - she had renal eval DrFox 2/08 w/ impression of prob hypertensive nephrosclerosis... she was encouraged to take her meds regularly to try an acheive adeq BP control... ~  labs 6/11 showed no proteinuria...  DEGENERATIVE JOINT DISEASE (ICD-715.90) - she takes arthritis Tylenol + Aleve Prn... LBP w/ Lumbar Spondylosis > eval by DrHilts for Ortho... FIBROMYALGIA (ICD-729.1) SOMATIC DYSFUNCTION (ICD-739.9) - hx of  mult somatic complaints w/ neuro eval by DrSteifel in 1995... nothing found. ~  MRI Lumbar spine 3/12 showed multilevel spondylosis w/ severe congenital & acquired canal & lat recess stenosis w/ advanced facet degen dis...  ANXIETY (ICD-300.00) - on ALPRAZOLAM 0.5mg - 1/2 - 1 tab Tid Prn...   PERS HX NONCOMPLIANCE W/MED TX (ICD-V15.81) - AS NOTED...  Hx of SICKLE-CELL TRAIT (ICD-282.5) - pt reports + test for sickle trait in the past. ~  labs 10/09 showed Hg= 14.4 ~  labs 6/11 showed Hg= 13.4, MCV= 87 ~  Labs 3/13 here showed Hg= 15.3, MCV= 88  Hx SHINGLES >> presented w/ pain left side of buttock 12/10 w/ "cyst"> exam showed "shingles" left S2 distrib- mild rash, mild-mod pain by her account; we discussed Rx w/ Acyclovir + Medrol Dosepak, and OTC pain meds Prn (all symptoms resolved)   Past Surgical History  Procedure Laterality Date  . Abdominal hysterectomy    . Robotic assisted laparoscopic sacrocolpopexy  09/2009    Dr. Matilde Sprang  . Cataract extraction w/ intraocular lens  implant, bilateral  2012    Outpatient Encounter Prescriptions as of 07/12/2013  Medication Sig  . ALPRAZolam (XANAX) 0.5 MG tablet 1/2 to 1 tablet by mouth 3 times a day for nerves  . atenolol (TENORMIN) 50 MG tablet Take 50 mg by mouth daily.   [name] dextromethorphan-guaiFENesin (MUCINEX DM) 30-600 MG per 12 hr tablet Take 1 tablet by mouth. 1-2 TIMES DAILY AS NEEDED   . estradiol (ESTRACE) 0.1 MG/GM vaginal cream Place 2 g vaginally daily.  [name] gabapentin (NEURONTIN) 100 MG capsule 1 daily for 3 days, 1 BID for 3 days, then 1 TID.  [name] glucose blood (OPTIUM TEST STRIPS) test strip Test blood sugar once daily  Dx  250.00  . metFORMIN (GLUCOPHAGE) 500 MG tablet Take 1 tablet (500 mg total) by mouth daily with breakfast.  . Multiple Vitamins-Minerals (MULTIVITAMIN & MINERAL PO) Take 1 tablet by mouth daily.  [name] olmesartan-hydrochlorothiazide (BENICAR HCT) 40-25 MG per tablet Take 1 tablet by mouth daily.    [name]  omeprazole (PRILOSEC) 20 MG capsule Take 20 mg by mouth daily. 30 minutes prior to a meal  . Diphenhyd-Hydrocort-Nystatin (FIRST-DUKES MOUTHWASH) SUSP 1 teaspoon by mouth gargle and swallow four times daily as needed  . sertraline (ZOLOFT) 50 MG tablet Take 1 tablet (50 mg total) by mouth daily.  . traMADol (ULTRAM) 50 MG tablet Take 2 tablets (100 mg total) by mouth every 8 (eight) hours as needed.  . [DISCONTINUED] levofloxacin (LEVAQUIN) 500 MG tablet Take 1 tablet (500 mg total) by mouth daily.    Allergies  Allergen Reactions  . Fish Allergy Hives  . Shellfish Allergy Hives  . Penicillins     REACTION: red rash  Current Medications, Allergies, Past Medical History, Past Surgical History, Family History, and Social History were reviewed in [employer] record.    Review of Systems        See HPI - all other systems neg except as noted...  The patient complains of decreased hearing, dyspnea on exertion, muscle weakness, difficulty walking, and depression.  The patient denies anorexia, fever, weight loss, weight gain, vision loss, hoarseness, chest pain, syncope, peripheral edema, prolonged cough, headaches, hemoptysis, abdominal pain, melena, hematochezia, severe indigestion/heartburn, hematuria, incontinence, suspicious skin lesions, transient blindness, unusual weight change, abnormal bleeding, enlarged lymph nodes, and angioedema.    Objective:   Physical Exam     WD, WN, 73 y/o BF in NAD... GENERAL:  Alert & oriented; pleasant & cooperative... HEENT:  [place]/AT, EOM-wnl, PERRLA, EACs-clear, TMs-wnl, NOSE-clear, THROAT-clear & wnl. NECK:  Supple w/ fairROM; no JVD; normal carotid impulses w/o bruits; no thyromegaly or nodules palpated; no lymphadenopathy. CHEST:  CTA ... HEART:  Regular Rhythm; without murmurs/ rubs/ or gallops. ABDOMEN:  Soft & nontender; normal bowel sounds; no organomegaly or masses detected. EXT: without deformities, mild arthritic  changes; no varicose veins/ +venous insuffic/ no edema. Tenderness along right knee, pain w/ extension, no sign effusion noted.  Neg SLR  NEURO:  CN's intact; no focal neuro deficits...good sensation of feet, legs.  DERM:  No lesions noted; no rash etc...  feet w/ no lesion/ulcers noted.   Assessment & Plan:

## 2013-07-12 NOTE — Assessment & Plan Note (Signed)
Right leg/knee pain ? Referred pain from back or Knee DJD +/- DM neuropathy  Pt has known spinal stenosis prev eval [facility]   Plan  buprofen 200mg  2 tabs Twice daily  For 5 days , take with food  May use ice to knee x 15 min Three times a day  As needed   Continue on neurontin 100mg  , increase as directed to 100mg  Three times a day  . Refer to ortho for knee and back pain .  follow up Dr. Lenna [name]  In 2 months and As needed   Please contact office for sooner follow up if symptoms do not improve or worsen or seek emergency care

## 2013-07-12 NOTE — Addendum Note (Signed)
Addended by: Parke Poisson E on: 07/12/2013 11:20 AM   Modules accepted: Orders

## 2013-09-11 ENCOUNTER — Other Ambulatory Visit (INDEPENDENT_AMBULATORY_CARE_PROVIDER_SITE_OTHER): Payer: Medicare Other

## 2013-09-11 ENCOUNTER — Ambulatory Visit (INDEPENDENT_AMBULATORY_CARE_PROVIDER_SITE_OTHER): Payer: Medicare Other | Admitting: Pulmonary Disease

## 2013-09-11 ENCOUNTER — Encounter: Payer: Self-pay | Admitting: Pulmonary Disease

## 2013-09-11 ENCOUNTER — Encounter (INDEPENDENT_AMBULATORY_CARE_PROVIDER_SITE_OTHER): Payer: Self-pay

## 2013-09-11 VITALS — BP 132/80 | HR 65 | Temp 97.0°F | Ht 67.0 in | Wt 188.0 lb

## 2013-09-11 DIAGNOSIS — M79609 Pain in unspecified limb: Secondary | ICD-10-CM

## 2013-09-11 DIAGNOSIS — E7800 Pure hypercholesterolemia, unspecified: Secondary | ICD-10-CM

## 2013-09-11 DIAGNOSIS — IMO0001 Reserved for inherently not codable concepts without codable children: Secondary | ICD-10-CM

## 2013-09-11 DIAGNOSIS — F411 Generalized anxiety disorder: Secondary | ICD-10-CM

## 2013-09-11 DIAGNOSIS — D573 Sickle-cell trait: Secondary | ICD-10-CM

## 2013-09-11 DIAGNOSIS — R300 Dysuria: Secondary | ICD-10-CM

## 2013-09-11 DIAGNOSIS — M1990 Unspecified osteoarthritis, unspecified site: Secondary | ICD-10-CM

## 2013-09-11 DIAGNOSIS — K5730 Diverticulosis of large intestine without perforation or abscess without bleeding: Secondary | ICD-10-CM

## 2013-09-11 DIAGNOSIS — M549 Dorsalgia, unspecified: Secondary | ICD-10-CM

## 2013-09-11 DIAGNOSIS — R7309 Other abnormal glucose: Secondary | ICD-10-CM

## 2013-09-11 DIAGNOSIS — I872 Venous insufficiency (chronic) (peripheral): Secondary | ICD-10-CM

## 2013-09-11 DIAGNOSIS — I10 Essential (primary) hypertension: Secondary | ICD-10-CM

## 2013-09-11 DIAGNOSIS — M25569 Pain in unspecified knee: Secondary | ICD-10-CM

## 2013-09-11 LAB — HEPATIC FUNCTION PANEL
ALBUMIN: 4 g/dL (ref 3.5–5.2)
ALT: 16 U/L (ref 0–35)
AST: 17 U/L (ref 0–37)
Alkaline Phosphatase: 65 U/L (ref 39–117)
Bilirubin, Direct: 0.1 mg/dL (ref 0.0–0.3)
TOTAL PROTEIN: 7.3 g/dL (ref 6.0–8.3)
Total Bilirubin: 0.7 mg/dL (ref 0.3–1.2)

## 2013-09-11 LAB — CBC WITH DIFFERENTIAL/PLATELET
BASOS PCT: 0.8 % (ref 0.0–3.0)
Basophils Absolute: 0 10*3/uL (ref 0.0–0.1)
EOS PCT: 3.4 % (ref 0.0–5.0)
Eosinophils Absolute: 0.2 10*3/uL (ref 0.0–0.7)
HCT: 45.3 % (ref 36.0–46.0)
Hemoglobin: 15 g/dL (ref 12.0–15.0)
LYMPHS PCT: 38.7 % (ref 12.0–46.0)
Lymphs Abs: 1.9 10*3/uL (ref 0.7–4.0)
MCHC: 33 g/dL (ref 30.0–36.0)
MCV: 87.5 fl (ref 78.0–100.0)
MONOS PCT: 10.7 % (ref 3.0–12.0)
Monocytes Absolute: 0.5 10*3/uL (ref 0.1–1.0)
NEUTROS PCT: 46.4 % (ref 43.0–77.0)
Neutro Abs: 2.3 10*3/uL (ref 1.4–7.7)
Platelets: 258 10*3/uL (ref 150.0–400.0)
RBC: 5.17 Mil/uL — AB (ref 3.87–5.11)
RDW: 16.9 % — ABNORMAL HIGH (ref 11.5–14.6)
WBC: 4.9 10*3/uL (ref 4.5–10.5)

## 2013-09-11 LAB — URINALYSIS, ROUTINE W REFLEX MICROSCOPIC
BILIRUBIN URINE: NEGATIVE
Hgb urine dipstick: NEGATIVE
Ketones, ur: NEGATIVE
LEUKOCYTES UA: NEGATIVE
Nitrite: NEGATIVE
Specific Gravity, Urine: 1.02 (ref 1.000–1.030)
Total Protein, Urine: 30 — AB
Urine Glucose: NEGATIVE
Urobilinogen, UA: 0.2 (ref 0.0–1.0)
pH: 6 (ref 5.0–8.0)

## 2013-09-11 LAB — BASIC METABOLIC PANEL
BUN: 16 mg/dL (ref 6–23)
CHLORIDE: 103 meq/L (ref 96–112)
CO2: 27 meq/L (ref 19–32)
CREATININE: 1.1 mg/dL (ref 0.4–1.2)
Calcium: 9.1 mg/dL (ref 8.4–10.5)
GFR: 63.79 mL/min (ref >60.00)
Glucose, Bld: 102 mg/dL — ABNORMAL HIGH (ref 70–99)
Potassium: 4.2 mEq/L (ref 3.5–5.1)
Sodium: 139 mEq/L (ref 135–145)

## 2013-09-11 LAB — LIPID PANEL
Cholesterol: 182 mg/dL (ref 0–200)
HDL: 43 mg/dL (ref >39.00)
LDL Cholesterol: 122 mg/dL — ABNORMAL HIGH (ref 0–99)
TRIGLYCERIDES: 87 mg/dL (ref 0.0–149.0)
Total CHOL/HDL Ratio: 4
VLDL: 17.4 mg/dL (ref 0.0–40.0)

## 2013-09-11 LAB — TSH: TSH: 1.18 u[IU]/mL (ref 0.35–5.50)

## 2013-09-11 LAB — HEMOGLOBIN A1C: HEMOGLOBIN A1C: 6.8 % — AB (ref 4.6–6.5)

## 2013-09-11 MED ORDER — GLUCOSE BLOOD VI STRP: ORAL_STRIP | Status: DC

## 2013-09-11 MED ORDER — ACCU-CHEK AVIVA PLUS W/DEVICE KIT: PACK | Status: DC

## 2013-09-11 NOTE — Patient Instructions (Signed)
Today we updated your med list in our EPIC system...    Continue your current medications the same...  Today we did your follow up FASTING blood work...    We will contact you w/ the results when available...   Let's get on track w/ our low salt, low carb diet & work on weight reduction...  Call for any questions or if we can be of service in any way... You should plan a medical follow up in 6 months.Marland KitchenMarland Kitchen

## 2013-09-11 NOTE — Progress Notes (Signed)
Subjective:    Patient ID: Kayla [name], female    DOB: 06/30/1940, 73 y.o.   MRN: [id number]  HPI 73 y/o BF here for a follow up visit... she has mult med problems as noted below...  She has HBP treated by [facility] w/ ATENOLOL 50mg /d and BENICAR/HCT 40-25 daily... states her control has been good... he did her lab work and he wanted her to start [place] which she says she takes only every once in awhile- true to her history of chronic non-compliance w/ medical therapy & she states she's afraid of meds since she has "so many reactions to things"...  ~  September 07, 2011:  7mo ROV & Kayla [name] has mult somatic complaints- feels dizzy, no energy, states she has trouble w/ meds- eg the MMW makes her sleepy & dizzy; she continues to have her CV problems managed by [facility];  We reviewed her meds, problem list, & labs> see below>>  LABS 3/13:  FLP- ok x LDL=106 on Diet;  Chems- wnl w/ BS=102 A1c=6.1;  CBC= wnl;  TSH=1.28...  ~  April 06, 2012:  88mo ROV & Kayla [name] is c/o fatigue, no energy, swelling, nasal drainage, gas, etc> see below:     HBP> on Atenolol25 & BenicarHCT40-25; BP= 128/78 & still followed for Cards by [facility] & 8/13 Myoview at [facility] was neg...    Ven Insuffic> on low sodium diet, elevation, support hose; wt up to 185# (gain of 14#) & we reviewed NO SALT, elevation, TEDs...    Chol> on Diet alone & managed by [facility]; we do not have his labs...    DM> on Diet alone as well & her last BS in EPIC was 102 in [id number]; we reviewed diet, exercise, etc...    Divertics, Constip> on OTC laxatives as needed; notes swelling, bloating & we reviewed Gas-X, Mylicon, Phazyme...    Vag prolapse> followed by Urology & GYN on Estrace; she saw DrMacDiarmid 7/13- stress & urge incont, tried Myrbetriq...     DJD. FM, Somatic dysfunction> as above- and she is reminded of the importance of gentle stretching exercises etc; she notes that calcium constipates her 7 asked to try Viactiv...     Anxiety> on Alprazolam  as needed & encouraged to use it more regularly...    Sickle Trait> aware- last Hg= 15.3 in Mar2013... We reviewed prob list, meds, xrays and labs> see below for updates >> she declines the 2013 flu vaccine...  ~  September 07, 2012:  72mo ROV & Kayla [name] is depressed & wants to try medication- offered Sertraline 50mg  Qhs as a trial; We reviewed the following medical problems during today's office visit >>     HBP> on Atenolol25 & BenicarHCT40-25; BP= 134/88 & still followed for Cards by [facility] & 8/13 Myoview at [facility] was neg...    Ven Insuffic> on low sodium diet, elevation, support hose; she has VI & trace edema- needs to do better on sodium restriction...    Chol> on Diet alone & managed by [facility]; FLP here 3/14 shows TChol 180, TG 75, HDL 45, LDL 120; needs better low chol diet...    DM> on Diet alone; she has gained 8# up to 193#; Labs 3/14 shows BS=131, A1c=7.5, and she is rec to start Metformin 500mg /d...    Divertics, Constip> on Prilosec20 & OTC laxatives as needed; notes swelling, bloating & constip=> rec to take MiralaxBid & Senakot-S2Qhs...    Vag prolapse> followed by Urology & GYN on Estrace; she saw DrMacDiarmid 7/13- stress &  urge incont, tried Myrbetriq...     DJD, LBP, FM, Somatic dysfunction> she is reminded of the importance of gentle stretching exercises etc; she notes that calcium constipates her & asked to try Viactiv...     Anxiety> on Alprazolam0.5mg  as needed & encouraged to use it more regularly...    Sickle Trait> aware- last Hg= 15.3 in Mar2013... We reviewed prob list, meds, xrays and labs> see below for updates >> she declined the Flu vaccine  LABS 3/14:  FLP- ok x LDL=120 on diet alone;  Chems- ok x BS=131 A1c=7.5;  CBC- wnl;  TSH=1.28;  VitD=30;  Mg=1.8.Marland KitchenMarland Kitchen She is rec to start Metformin 500mg /d and Vit D OTC supplement ~2000u daily...  ~  September 11, 2013:  64yr [name] did not ret for a 60mo ROV as suggested after her last visit when Metform500Qam & VitD2000 were  started; she reports a good yr overall, notes some difficulty w/ stiffness & hard to get up & down; notes some dental work & bitter taste in her mouth (try MMW), some whitish phlegm (use Mucinex)... We reviewed the following medical problems during today's office visit >>     HBP> on Atenolol25 & BenicarHCT40-25; BP= 132/80 & still followed for Cards by [facility] & 8/13 Myoview at [facility] was neg...    Ven Insuffic> on low sodium diet, elevation, support hose; she has VI & trace edema- needs to do better on sodium restriction...    Chol> on Diet alone & managed by [facility]; FLP here 3/15 shows TChol 182, TG 87, HDL 42, LDL 122; needs better low chol diet...    DM> on Metform500Qam now; she has lost 5# down to 188#; Labs 3/15 shows BS=102, A1c=6.8 (improved), continue same...    Divertics, Constip> on Prilosec20 & OTC laxatives as needed; notes occas swelling, bloating & constip=> rec to take MiralaxBid & Senakot-S2Qhs...    Vag prolapse> followed by Urology & GYN on Estrace; she saw DrMacDiarmid 7/13- stress & urge incont, tried Myrbetriq...     DJD, LBP, FM, Somatic dysfunction> she is reminded of the importance of gentle stretching exercises etc; states she saw Ortho- DrNitka & DrDean- had MRI of back & offered shots (she declined), may need surg she says (we do not have records).    Anxiety> on Zoloft50 & Alprazolam0.5mg  as needed & encouraged to use it more regularly...    Sickle Trait> aware- Hg= 15.0 and stable... We reviewed prob list, meds, xrays and labs> see below for updates >> she refuses the flu vaccine  LABS 3/15:  FLP- ok x LDL=122 on diet alone;  Chems- wnl, BS=102, A1c=6.8 on Metform500;  CBC- wnl;  TSH=1.18;  UA- neg w/ neg cult...            Problem List:  HYPERTENSION (ICD-401.9) - on BENICAR/ HCT 40-25 daily ("sometimes I only take 1/2") + ATENOLOL 50mg - 1/2 tab/d... Mod HBP that is easy enough to control when she takes her meds regularly... DrHarwani has assumed management of  her BP... ~  CXR 1/09 showed clear lungs, sl cardiomegaly w/o CHF... ~  she continues to have regular check ups w/ DrHarwani... ~  CXR 6/12 showed heart size wnl, clear lungs, NAD... ~  10/12:  BP today= 136/80, & denies HA, fatigue, visual changes, CP, palipit, dizziness, syncope, dyspnea, edema, etc...  ~  3/13:  BP= 130/100 & reminded to take meds regualarly & f/u w/ DrHarwani.. ~  8/13: Myoview by DrHarwani at [facility] was neg- no ischemia, nor  wall motion abn, EF=75% ~  3/14: on Atenolol25 & BenicarHCT40-25; BP= 134/88 & still followed for Cards by [facility] & 8/13 Myoview at [facility] was neg. ~  3/15: on Atenolol25 & BenicarHCT40-25; BP= 132/80 & still followed for Cards by [facility] & she remains asymptoatic...  VENOUS INSUFFICIENCY (ICD-459.81) - on low sodium diet, elevation, support hose & the Hct.  HYPERCHOLESTEROLEMIA, MILD (ICD-272.0 - followed & treated by [facility] on diet alone, we don't have his labs or notes. ~  FLP here 11/04 showed TChol 214, TG 122, HDL 44, LDL 145 ~  FLP here 3/08 showed TChol 164, TG 59, HDL 45, LDL 107 ~  FLP here 6/11 showed TChol 163, Tg 60, HDL 45, LDL 106... continue diet efforts. ~  She has labs done by DrWarwani (we do not have records from him)... ~  FLP here 3/13 on Diet alone showed TChol 174, TG 95, HDL 49, LDL 106 ~  FLP here 3/14 on Diet alone showed TChol 180, TG 75, HDL 45, LDL 120... Needs better diet, doesn't want meds. ~  FLP here 3/15 on diet alone showed TChol 182, TG 87, HDL 42, LDL 122  DIABETES MELLITUS, BORDERLINE (ICD-790.29)) - diet controlled... BS all in the 100-110 range when last checked here (we don't have labs from [facility])... hx intol to Metformin in past... ~  lab 10/09 showed BS= 85 ~  12/10:  fingerstick BS here = 87 ~  labs here 6/11 showed BS= 97, A1c= 6.2.Marland KitchenMarland Kitchen continue diet Rx. ~  She has labs done by DrWarwani (we do not have records from him)... ~  Labs here 3/13 showed BS= 102, A1c= 6.1 ~  on Diet alone; she has gained 8#  up to 193#; Labs 3/14 shows BS=131, A1c=7.5, and she is rec to start Metformin $RemoveBeforeD'500mg'zrWlXeomVcIFar$ /d.  ~  3/15: on Metform500Qam now; she has lost 5# down to 188#; Labs 3/15 shows BS=102, A1c=6.8 (improved), continue same  DIVERTICULOSIS OF COLON (ICD-562.10) - colonoscopy 4/04 by DrBrodie showed divertics, f/u 31yr. CONSTIPATION (ICD-564.00) - we discussed Rx w/ MIRALAX Bid + SENAKOT-S 1-2 Qhs...  PROLAPSE OF VAGINAL VAULT AFTER HYSTERECTOMY (ICD-618.5) - evals by GYN & Urology- DrKimbrough, MacDiarmid, Borden... she has vault prolapse w/ cystocele, rectocele, enterocele & failed pessary Rx- s/p robotic sacrocolpopexy 4/11 by DrMacDiarmid & improved. ~  7/13: DrMacDiarmid treated her w/ Myrbetriq trial...  Hx of PROTEINURIA (ICD-791.0) - she had renal eval DrFox 2/08 w/ impression of prob hypertensive nephrosclerosis... she was encouraged to take her meds regularly to try an acheive adeq BP control... ~  labs 6/11 showed no proteinuria...  DEGENERATIVE JOINT DISEASE (ICD-715.90) - she takes arthritis Tylenol + Aleve Prn... LBP w/ Lumbar Spondylosis > eval by DrHilts for Ortho... FIBROMYALGIA (ICD-729.1) SOMATIC DYSFUNCTION (ICD-739.9) - hx of mult somatic complaints w/ neuro eval by DrSteifel in 1995... nothing found. ~  MRI Lumbar spine 3/12 showed multilevel spondylosis w/ severe congenital & acquired canal & lat recess stenosis w/ advanced facet degen dis... ~  3/15: she is reminded of the importance of gentle stretching exercises etc; states she saw Ortho- DrNitka & DrDean- had MRI of back & offered shots (she declined), may need surg she says (we do not have records).   ANXIETY (ICD-300.00) - on ZOLOFT50 & ALPRAZOLAM 0.$RemoveBefore'5mg'FcrCYSjVDYDsu$ - 1/2 - 1 tab Tid Prn...   PERS HX NONCOMPLIANCE W/MED TX (ICD-V15.81) - AS NOTED...  Hx of SICKLE-CELL TRAIT (ICD-282.5) - pt reports + test for sickle trait in the past. ~  labs 10/09 showed  Hg= 14.4 ~  labs 6/11 showed Hg= 13.4, MCV= 87 ~  Labs 3/13 here showed Hg= 15.3, MCV=  88 ~  Labs 3/14 showed Hg= 14.0, MCV=86 ~  Labs 3/15 showed Hg= 15.0  Hx SHINGLES >> presented w/ pain left side of buttock 12/10 w/ "cyst"> exam showed "shingles" left S2 distrib- mild rash, mild-mod pain by her account; we discussed Rx w/ Acyclovir + Medrol Dosepak, and OTC pain meds Prn (all symptoms resolved)   Past Surgical History  Procedure Laterality Date  . Abdominal hysterectomy    . Robotic assisted laparoscopic sacrocolpopexy  09/2009    Dr. Sherron Monday  . Cataract extraction w/ intraocular lens  implant, bilateral  2012    Outpatient Encounter Prescriptions as of 09/11/2013  Medication Sig  . ALPRAZolam (XANAX) 0.5 MG tablet 1/2 to 1 tablet by mouth 3 times a day for nerves  . atenolol (TENORMIN) 50 MG tablet Take 50 mg by mouth daily.   [name] dextromethorphan-guaiFENesin (MUCINEX DM) 30-600 MG per 12 hr tablet Take 1 tablet by mouth. 1-2 TIMES DAILY AS NEEDED   . Diphenhyd-Hydrocort-Nystatin (FIRST-DUKES MOUTHWASH) SUSP 1 teaspoon by mouth gargle and swallow four times daily as needed  . metFORMIN (GLUCOPHAGE) 500 MG tablet Take 1 tablet (500 mg total) by mouth daily with breakfast.  . Multiple Vitamins-Minerals (MULTIVITAMIN & MINERAL PO) Take 1 tablet by mouth daily.  [name] olmesartan-hydrochlorothiazide (BENICAR HCT) 40-25 MG per tablet Take 1 tablet by mouth daily.    [name] omeprazole (PRILOSEC) 20 MG capsule Take 20 mg by mouth daily. 30 minutes prior to a meal  . sertraline (ZOLOFT) 50 MG tablet Take 1 tablet (50 mg total) by mouth daily.  [name] estradiol (ESTRACE) 0.1 MG/GM vaginal cream Place 2 g vaginally daily.  [name] gabapentin (NEURONTIN) 100 MG capsule 1 daily for 3 days, 1 BID for 3 days, then 1 TID.  [name] glucose blood (OPTIUM TEST STRIPS) test strip Test blood sugar once daily  Dx  250.00  . traMADol (ULTRAM) 50 MG tablet Take 2 tablets (100 mg total) by mouth every 8 (eight) hours as needed.    Allergies  Allergen Reactions  . Fish Allergy Hives  . Shellfish Allergy Hives   . Penicillins     REACTION: red rash    Current Medications, Allergies, Past Medical History, Past Surgical History, Family History, and Social History were reviewed in [employer] record.    Review of Systems        See HPI - all other systems neg except as noted...  The patient complains of decreased hearing, dyspnea on exertion, muscle weakness, difficulty walking, and depression.  The patient denies anorexia, fever, weight loss, weight gain, vision loss, hoarseness, chest pain, syncope, peripheral edema, prolonged cough, headaches, hemoptysis, abdominal pain, melena, hematochezia, severe indigestion/heartburn, hematuria, incontinence, suspicious skin lesions, transient blindness, unusual weight change, abnormal bleeding, enlarged lymph nodes, and angioedema.    Objective:   Physical Exam     WD, WN, 72 y/o BF in NAD... GENERAL:  Alert & oriented; pleasant & cooperative... she has mult trigger points bilat... HEENT:  [place]/AT, EOM-wnl, PERRLA, EACs-clear, TMs-wnl, NOSE-clear, THROAT-clear & wnl. NECK:  Supple w/ fairROM; no JVD; normal carotid impulses w/o bruits; no thyromegaly or nodules palpated; no lymphadenopathy. CHEST:  few scat rhonchi bilat without rales or consolidation... HEART:  Regular Rhythm; without murmurs/ rubs/ or gallops. ABDOMEN:  Soft & nontender; normal bowel sounds; no organomegaly or masses detected. EXT: without deformities, mild arthritic changes; no  varicose veins/ +venous insuffic/ no edema. NEURO:  CN's intact; no focal neuro deficits... DERM:  No lesions noted; no rash etc...  RADIOLOGY DATA:  Reviewed in the EPIC EMR & discussed w/ the patient...  LABORATORY DATA:  Reviewed in the EPIC EMR & discussed w/ the patient...   Assessment & Plan:    HBP> on Atenolol & BenicarHCT;  Followed [facility];  BP has been easily controlled when she takes meds regularly; reminded to take every day!     Ven Insuffic> on low  sodium diet, elevation, support hose; wt=188# which is down5# & reminded of diet, no salt, exercise...     Chol> on Diet alone & managed by [facility]; FLP here 3/15 was ok x LDL 122.     DM> improved on Metformin $RemoveBefo'500mg'cswASvuOKQO$ /d; labs 3/15 showed BS=102, A1c=6.8     Divertics, Constip> on OTC laxatives as needed...     Vag prolapse> followed by Urology s/p surg...     DJD. FM, Somatic dysfunction> as above- and she is reminded of the importance of gentle stretching exercises etc...     Anxiety> on Alprazolam as needed & encouraged to use it more regularly...     Sickle Trait> aware- CBC is wnl...   Patient's Medications  New Prescriptions   BLOOD GLUCOSE MONITORING SUPPL (ACCU-CHEK AVIVA PLUS) W/DEVICE KIT    Test blood sugar once daily   GLUCOSE BLOOD (ACCU-CHEK AVIVA) TEST STRIP    Check blood sugar once daily  Previous Medications   ALPRAZOLAM (XANAX) 0.5 MG TABLET    1/2 to 1 tablet by mouth 3 times a day for nerves   ATENOLOL (TENORMIN) 50 MG TABLET    Take 50 mg by mouth daily.    DEXTROMETHORPHAN-GUAIFENESIN (MUCINEX DM) 30-600 MG PER 12 HR TABLET    Take 1 tablet by mouth. 1-2 TIMES DAILY AS NEEDED    DIPHENHYD-HYDROCORT-NYSTATIN (FIRST-DUKES MOUTHWASH) SUSP    1 teaspoon by mouth gargle and swallow four times daily as needed   ESTRADIOL (ESTRACE) 0.1 MG/GM VAGINAL CREAM    Place 2 g vaginally daily.   MULTIPLE VITAMINS-MINERALS (MULTIVITAMIN & MINERAL PO)    Take 1 tablet by mouth daily.   OLMESARTAN-HYDROCHLOROTHIAZIDE (BENICAR HCT) 40-25 MG PER TABLET    Take 1 tablet by mouth daily.     OMEPRAZOLE (PRILOSEC) 20 MG CAPSULE    Take 20 mg by mouth daily. 30 minutes prior to a meal   SERTRALINE (ZOLOFT) 50 MG TABLET    Take 1 tablet (50 mg total) by mouth daily.   TRAMADOL (ULTRAM) 50 MG TABLET    Take 2 tablets (100 mg total) by mouth every 8 (eight) hours as needed.  Modified Medications   Modified Medication Previous Medication   METFORMIN (GLUCOPHAGE) 500 MG TABLET metFORMIN  (GLUCOPHAGE) 500 MG tablet      TAKE 1 TABLET DAILY WITH BREAKFAST    TAKE 1 TABLET DAILY WITH BREAKFAST  Discontinued Medications   GABAPENTIN (NEURONTIN) 100 MG CAPSULE    1 daily for 3 days, 1 BID for 3 days, then 1 TID.   GLUCOSE BLOOD (OPTIUM TEST STRIPS) TEST STRIP    Test blood sugar once daily  Dx  250.00   METFORMIN (GLUCOPHAGE) 500 MG TABLET    Take 1 tablet (500 mg total) by mouth daily with breakfast.

## 2013-09-12 LAB — URINE CULTURE
COLONY COUNT: NO GROWTH
ORGANISM ID, BACTERIA: NO GROWTH

## 2013-09-13 ENCOUNTER — Other Ambulatory Visit: Payer: Self-pay | Admitting: Pulmonary Disease

## 2013-09-13 DIAGNOSIS — R7309 Other abnormal glucose: Secondary | ICD-10-CM

## 2013-09-13 MED ORDER — GLUCOSE BLOOD VI STRP: ORAL_STRIP | Status: DC

## 2013-09-13 MED ORDER — ACCU-CHEK AVIVA PLUS W/DEVICE KIT: PACK | Status: DC

## 2013-09-20 ENCOUNTER — Other Ambulatory Visit: Payer: Self-pay | Admitting: Pulmonary Disease

## 2013-10-30 ENCOUNTER — Telehealth: Payer: Self-pay | Admitting: Emergency Medicine

## 2013-10-30 DIAGNOSIS — E7800 Pure hypercholesterolemia, unspecified: Secondary | ICD-10-CM

## 2013-10-30 MED ORDER — METFORMIN HCL 500 MG PO TABS: ORAL_TABLET | ORAL | Status: DC

## 2013-10-30 NOTE — Telephone Encounter (Signed)
Called and spoke to pt regarding a possible referral for a new PCP. Pt states she would like [place] to refer her to a new PCP. Order has been placed and pt is aware that someone will be contacting her soon to set up a new PCP. Pt is also aware that we received a refill request for a 90 supply of Metformin and was sent to [facility] to last her till she is set up with a new PCP.

## 2013-11-15 ENCOUNTER — Other Ambulatory Visit: Payer: Self-pay | Admitting: Pulmonary Disease

## 2013-11-19 ENCOUNTER — Telehealth: Payer: Self-pay | Admitting: Pulmonary Disease

## 2013-11-19 NOTE — Telephone Encounter (Signed)
Error

## 2013-12-28 ENCOUNTER — Other Ambulatory Visit (INDEPENDENT_AMBULATORY_CARE_PROVIDER_SITE_OTHER): Payer: Self-pay | Admitting: Otolaryngology

## 2013-12-28 DIAGNOSIS — J329 Chronic sinusitis, unspecified: Secondary | ICD-10-CM

## 2014-01-03 ENCOUNTER — Telehealth: Payer: Self-pay | Admitting: Pulmonary Disease

## 2014-01-03 NOTE — Telephone Encounter (Signed)
appt scheduled for tomorrow at 9:30. Nothing further needed

## 2014-01-03 NOTE — Telephone Encounter (Signed)
[phone] returning a call

## 2014-01-03 NOTE — Telephone Encounter (Signed)
Per SN---  Ok to schedule an appt with SN to discuss.  thanks

## 2014-01-03 NOTE — Telephone Encounter (Signed)
Called spoke with Olin Hauser. She reports her BS has been elevated x couple days. Thinks this is coming from secretions from gums. She could not give the numbers of BS but wants her to be seen. She is not sure how pt takes her metformin but thinks she takes it as prescribed. Please advise SN thanks  Allergies  Allergen Reactions  . Fish Allergy Hives  . Shellfish Allergy Hives  . Penicillins     REACTION: red rash     Current Outpatient Prescriptions on File Prior to Visit  Medication Sig Dispense Refill  . ALPRAZolam (XANAX) 0.5 MG tablet 1/2 to 1 tablet by mouth 3 times a day for nerves      . atenolol (TENORMIN) 50 MG tablet Take 50 mg by mouth daily.       . Blood Glucose Monitoring Suppl (ACCU-CHEK AVIVA PLUS) W/DEVICE KIT Test blood sugar once daily  1 kit  0  . dextromethorphan-guaiFENesin (MUCINEX DM) 30-600 MG per 12 hr tablet Take 1 tablet by mouth. 1-2 TIMES DAILY AS NEEDED       . Diphenhyd-Hydrocort-Nystatin (FIRST-DUKES MOUTHWASH) SUSP 1 teaspoon by mouth gargle and swallow four times daily as needed  120 mL  5  . estradiol (ESTRACE) 0.1 MG/GM vaginal cream Place 2 g vaginally daily.      [name] glucose blood (ACCU-CHEK AVIVA) test strip Check blood sugar once daily  100 each  4  . metFORMIN (GLUCOPHAGE) 500 MG tablet TAKE 1 TABLET DAILY WITH BREAKFAST  90 tablet  1  . Multiple Vitamins-Minerals (MULTIVITAMIN & MINERAL PO) Take 1 tablet by mouth daily.      [name] nystatin (MYCOSTATIN) 100000 UNIT/ML suspension GARGLE AND SWALLOW 1 TEASPOONFUL BY MOUTH 4 TIMES DAILY AS NEEDED.  120 mL  2  . olmesartan-hydrochlorothiazide (BENICAR HCT) 40-25 MG per tablet Take 1 tablet by mouth daily.        [name] omeprazole (PRILOSEC) 20 MG capsule Take 20 mg by mouth daily. 30 minutes prior to a meal      . sertraline (ZOLOFT) 50 MG tablet Take 1 tablet (50 mg total) by mouth daily.  30 tablet  11  . traMADol (ULTRAM) 50 MG tablet Take 2 tablets (100 mg total) by mouth every 8 (eight) hours as needed.  30  tablet  0   No current facility-administered medications on file prior to visit.

## 2014-01-03 NOTE — Telephone Encounter (Signed)
lmtcb x1 

## 2014-01-04 ENCOUNTER — Encounter: Payer: Self-pay | Admitting: Pulmonary Disease

## 2014-01-04 ENCOUNTER — Ambulatory Visit (INDEPENDENT_AMBULATORY_CARE_PROVIDER_SITE_OTHER): Payer: Medicare Other | Admitting: Pulmonary Disease

## 2014-01-04 VITALS — BP 122/80 | HR 67 | Temp 97.7°F | Ht 67.0 in | Wt 191.8 lb

## 2014-01-04 DIAGNOSIS — R7309 Other abnormal glucose: Secondary | ICD-10-CM

## 2014-01-04 DIAGNOSIS — E7800 Pure hypercholesterolemia, unspecified: Secondary | ICD-10-CM

## 2014-01-04 DIAGNOSIS — I872 Venous insufficiency (chronic) (peripheral): Secondary | ICD-10-CM

## 2014-01-04 DIAGNOSIS — M1990 Unspecified osteoarthritis, unspecified site: Secondary | ICD-10-CM

## 2014-01-04 DIAGNOSIS — E1140 Type 2 diabetes mellitus with diabetic neuropathy, unspecified: Secondary | ICD-10-CM | POA: Insufficient documentation

## 2014-01-04 DIAGNOSIS — F411 Generalized anxiety disorder: Secondary | ICD-10-CM

## 2014-01-04 DIAGNOSIS — I10 Essential (primary) hypertension: Secondary | ICD-10-CM

## 2014-01-04 DIAGNOSIS — IMO0001 Reserved for inherently not codable concepts without codable children: Secondary | ICD-10-CM

## 2014-01-04 MED ORDER — ATENOLOL 50 MG PO TABS: 50.0000 mg | ORAL_TABLET | Freq: Every day | ORAL | Status: DC

## 2014-01-04 MED ORDER — GLUCOSE BLOOD VI STRP: ORAL_STRIP | Status: DC

## 2014-01-04 NOTE — Progress Notes (Signed)
Subjective:    Patient ID: Kayla [name], female    DOB: 06/17/1941, 73 y.o.   MRN: [id number]  HPI 73 y/o BF here for a follow up visit... she has mult med problems as noted below... She has HBP treated by [facility] w/ ATENOLOL 50mg /d and BENICAR/HCT 40-25 daily... states her control has been good... he did her lab work and he wanted her to start Cromberg which she says she takes only every once in awhile- true to her history of chronic non-compliance w/ medical therapy & she states she's afraid of meds since she has "so many reactions to things"...  ~  September 07, 2011:  56mo ROV & Kayla [name] has mult somatic complaints- feels dizzy, no energy, states she has trouble w/ meds- eg the MMW makes her sleepy & dizzy; she continues to have her CV problems managed by [facility];  We reviewed her meds, problem list, & labs> see below>>  LABS 3/13:  FLP- ok x LDL=106 on Diet;  Chems- wnl w/ BS=102 A1c=6.1;  CBC= wnl;  TSH=1.28...  ~  April 06, 2012:  22mo ROV & Kayla [name] is c/o fatigue, no energy, swelling, nasal drainage, gas, etc> see below:     HBP> on Atenolol25 & BenicarHCT40-25; BP= 128/78 & still followed for Cards by [facility] & 8/13 Myoview at [facility] was neg...    Ven Insuffic> on low sodium diet, elevation, support hose; wt up to 185# (gain of 14#) & we reviewed NO SALT, elevation, TEDs...    Chol> on Diet alone & managed by [facility]; we do not have his labs...    DM> on Diet alone as well & her last BS in EPIC was 102 in [id number]; we reviewed diet, exercise, etc...    Divertics, Constip> on OTC laxatives as needed; notes swelling, bloating & we reviewed Gas-X, Mylicon, Phazyme...    Vag prolapse> followed by Urology & GYN on Estrace; she saw DrMacDiarmid 7/13- stress & urge incont, tried Myrbetriq...     DJD. FM, Somatic dysfunction> as above- and she is reminded of the importance of gentle stretching exercises etc; she notes that calcium constipates her 7 asked to try Viactiv...     Anxiety> on Alprazolam  as needed & encouraged to use it more regularly...    Sickle Trait> aware- last Hg= 15.3 in Mar2013... We reviewed prob list, meds, xrays and labs> see below for updates >> she declines the 2013 flu vaccine...  ~  September 07, 2012:  62mo ROV & Kayla [name] is depressed & wants to try medication- offered Sertraline 50mg  Qhs as a trial; We reviewed the following medical problems during today's office visit >>     HBP> on Atenolol25 & BenicarHCT40-25; BP= 134/88 & still followed for Cards by [facility] & 8/13 Myoview at [facility] was neg...    Ven Insuffic> on low sodium diet, elevation, support hose; she has VI & trace edema- needs to do better on sodium restriction...    Chol> on Diet alone & managed by [facility]; FLP here 3/14 shows TChol 180, TG 75, HDL 45, LDL 120; needs better low chol diet...    DM> on Diet alone; she has gained 8# up to 193#; Labs 3/14 shows BS=131, A1c=7.5, and she is rec to start Metformin 500mg /d...    Divertics, Constip> on Prilosec20 & OTC laxatives as needed; notes swelling, bloating & constip=> rec to take MiralaxBid & Senakot-S2Qhs...    Vag prolapse> followed by Urology & GYN on Estrace; she saw DrMacDiarmid 7/13- stress & urge  incont, tried Myrbetriq...     DJD, LBP, FM, Somatic dysfunction> she is reminded of the importance of gentle stretching exercises etc; she notes that calcium constipates her & asked to try Viactiv...     Anxiety> on Alprazolam0.5mg  as needed & encouraged to use it more regularly...    Sickle Trait> aware- last Hg= 15.3 in Mar2013... We reviewed prob list, meds, xrays and labs> see below for updates >> she declined the Flu vaccine  LABS 3/14:  FLP- ok x LDL=120 on diet alone;  Chems- ok x BS=131 A1c=7.5;  CBC- wnl;  TSH=1.28;  VitD=30;  Mg=1.8.Marland KitchenMarland Kitchen She is rec to start Metformin 500mg /d and Vit D OTC supplement ~2000u daily...  ~  September 11, 2013:  22yr Kayla [name] did not ret for a 4mo ROV as suggested after her last visit when Metform500Qam & VitD2000 were  started; she reports a good yr overall, notes some difficulty w/ stiffness & hard to get up & down; notes some dental work & bitter taste in her mouth (try MMW), some whitish phlegm (use Mucinex)... We reviewed the following medical problems during today's office visit >>     HBP> on Atenolol25 & BenicarHCT40-25; BP= 132/80 & still followed for Cards by [facility] & 8/13 Myoview at [facility] was neg...    Ven Insuffic> on low sodium diet, elevation, support hose; she has VI & trace edema- needs to do better on sodium restriction...    Chol> on Diet alone & managed by [facility]; FLP here 3/15 shows TChol 182, TG 87, HDL 42, LDL 122; needs better low chol diet...    DM> on Metform500Qam now; she has lost 5# down to 188#; Labs 3/15 shows BS=102, A1c=6.8 (improved), continue same...    Divertics, Constip> on Prilosec20 & OTC laxatives as needed; notes occas swelling, bloating & constip=> rec to take MiralaxBid & Senakot-S2Qhs...    Vag prolapse> followed by Urology & GYN on Estrace; she saw DrMacDiarmid 7/13- stress & urge incont, tried Myrbetriq...     DJD, LBP, FM, Somatic dysfunction> she is reminded of the importance of gentle stretching exercises etc; states she saw Ortho- DrNitka & DrDean- had MRI of back & offered shots (she declined), may need surg she says (we do not have records).    Anxiety> on Zoloft50 & Alprazolam0.5mg  as needed & encouraged to use it more regularly...    Sickle Trait> aware- Hg= 15.0 and stable... We reviewed prob list, meds, xrays and labs> see below for updates >> she refuses the flu vaccine  LABS 3/15:  FLP- ok x LDL=122 on diet alone;  Chems- wnl, BS=102, A1c=6.8 on Metform500;  CBC- wnl;  TSH=1.18;  UA- neg w/ neg cult...   ~  January 04, 2014:  52mo ROV & add-on appt requested for "diabetic issues"; she called w/ elev BS x several days, she said, w/ FBS ~160; she claims that she knows that this is coming from her gums (they are shiny & she feels a film) but her dentist didn't  find anything wrong and sent her to ENT for eval; she reports that they didn't find anything wrong either; she has not yet found a new primary care physician and asked to come in today to discuss & for further eval;  Review of prev epic notes indicates that DM has been well controlled on Metformin500mg Qd w/ last FBS=102, A1c=6.8;  We reviewed diet, exercise, and weight reduction strategies (her weight is up to 192#);  Exam is NEG & buccal mucosa, mouth,  etc all looks normal...    We reviewed prob list, meds, xrays and labs> see below for updates >> her other complaint today is constipation...  LABS 7/15:  Pending- she did not go to the lab for f/u Bmet & A1c as requested PLAN>  Labs pending today;  She is not reassured by my exam & discussion; I told her that I did not find anything in her mouth that would explain worsening DM control, neither did I find any kind of film/ coating/ etc;  We decided to refer her to an endocrinologist (at her request) for their eval and opinion...           Problem List:  HYPERTENSION (ICD-401.9) - on BENICAR/ HCT 40-25 daily ("sometimes I only take 1/2") + ATENOLOL $RemoveBe'50mg'bIoyfxcKo$ - 1/2 tab/d... Mod HBP that is easy enough to control when she takes her meds regularly... DrHarwani has assumed management of her BP... ~  CXR 1/09 showed clear lungs, sl cardiomegaly w/o CHF... ~  she continues to have regular check ups w/ DrHarwani... ~  CXR 6/12 showed heart size wnl, clear lungs, NAD... ~  10/12:  BP today= 136/80, & denies HA, fatigue, visual changes, CP, palipit, dizziness, syncope, dyspnea, edema, etc...  ~  3/13:  BP= 130/100 & reminded to take meds regualarly & f/u w/ DrHarwani.. ~  8/13: Myoview by Tedra Sen[place] at [facility] was neg- no ischemia, nor wall motion abn, EF=75% ~  3/14: on Atenolol25 & BenicarHCT40-25; BP= 134/88 & still followed for Cards by [facility] & 8/13 Myoview at [facility] was neg. ~  3/15: on Atenolol25 & BenicarHCT40-25; BP= 132/80 & still followed for Cards by  [facility] & she remains asymptoatic...  VENOUS INSUFFICIENCY (ICD-459.81) - on low sodium diet, elevation, support hose & the Hct.  HYPERCHOLESTEROLEMIA, MILD (ICD-272.0 - followed & treated by [facility] on diet alone, we don't have his labs or notes. ~  FLP here 11/04 showed TChol 214, TG 122, HDL 44, LDL 145 ~  FLP here 3/08 showed TChol 164, TG 59, HDL 45, LDL 107 ~  FLP here 6/11 showed TChol 163, Tg 60, HDL 45, LDL 106... continue diet efforts. ~  She has labs done by DrWarwani (we do not have records from him)... ~  FLP here 3/13 on Diet alone showed TChol 174, TG 95, HDL 49, LDL 106 ~  FLP here 3/14 on Diet alone showed TChol 180, TG 75, HDL 45, LDL 120... Needs better diet, doesn't want meds. ~  FLP here 3/15 on diet alone showed TChol 182, TG 87, HDL 42, LDL 122  DIABETES MELLITUS, BORDERLINE (ICD-790.29)) - diet controlled... BS all in the 100-110 range when last checked here (we don't have labs from [facility])... hx intol to Metformin in past... ~  lab 10/09 showed BS= 85 ~  12/10:  fingerstick BS here = 87 ~  labs here 6/11 showed BS= 97, A1c= 6.2.Marland KitchenMarland Kitchen continue diet Rx. ~  She has labs done by DrWarwani (we do not have records from him)... ~  Labs here 3/13 showed BS= 102, A1c= 6.1 ~  on Diet alone; she has gained 8# up to 193#; Labs 3/14 shows BS=131, A1c=7.5, and she is rec to start Metformin $RemoveBeforeD'500mg'aOlIUpRLltrfMA$ /d.  ~  3/15: on Metform500Qam now; she has lost 5# down to 188#; Labs 3/15 shows BS=102, A1c=6.8 (improved), continue same  DIVERTICULOSIS OF COLON (ICD-562.10) - colonoscopy 4/04 by DrBrodie showed divertics, f/u 38yr. CONSTIPATION (ICD-564.00) - we discussed Rx w/ MIRALAX Bid + SENAKOT-S 1-2 Qhs...  PROLAPSE  OF VAGINAL VAULT AFTER HYSTERECTOMY (ICD-618.5) - evals by GYN & Urology- DrKimbrough, MacDiarmid, Borden... she has vault prolapse w/ cystocele, rectocele, enterocele & failed pessary Rx- s/p robotic sacrocolpopexy 4/11 by DrMacDiarmid & improved. ~  7/13: DrMacDiarmid treated her w/  Myrbetriq trial...  Hx of PROTEINURIA (ICD-791.0) - she had renal eval DrFox 2/08 w/ impression of prob hypertensive nephrosclerosis... she was encouraged to take her meds regularly to try an acheive adeq BP control... ~  labs 6/11 showed no proteinuria...  DEGENERATIVE JOINT DISEASE (ICD-715.90) - she takes arthritis Tylenol + Aleve Prn... LBP w/ Lumbar Spondylosis > eval by DrHilts for Ortho... FIBROMYALGIA (ICD-729.1) SOMATIC DYSFUNCTION (ICD-739.9) - hx of mult somatic complaints w/ neuro eval by DrSteifel in 1995... nothing found. ~  MRI Lumbar spine 3/12 showed multilevel spondylosis w/ severe congenital & acquired canal & lat recess stenosis w/ advanced facet degen dis... ~  3/15: she is reminded of the importance of gentle stretching exercises etc; states she saw Ortho- DrNitka & DrDean- had MRI of back & offered shots (she declined), may need surg she says (we do not have records).   ANXIETY (ICD-300.00) - on ZOLOFT50 & ALPRAZOLAM 0.$RemoveBefore'5mg'unpJdYptjycqK$ - 1/2 - 1 tab Tid Prn...   PERS HX NONCOMPLIANCE W/MED TX (ICD-V15.81) - AS NOTED...  Hx of SICKLE-CELL TRAIT (ICD-282.5) - pt reports + test for sickle trait in the past. ~  labs 10/09 showed Hg= 14.4 ~  labs 6/11 showed Hg= 13.4, MCV= 87 ~  Labs 3/13 here showed Hg= 15.3, MCV= 88 ~  Labs 3/14 showed Hg= 14.0, MCV=86 ~  Labs 3/15 showed Hg= 15.0  Hx SHINGLES >> presented w/ pain left side of buttock 12/10 w/ "cyst"> exam showed "shingles" left S2 distrib- mild rash, mild-mod pain by her account; we discussed Rx w/ Acyclovir + Medrol Dosepak, and OTC pain meds Prn (all symptoms resolved)   Past Surgical History  Procedure Laterality Date  . Abdominal hysterectomy    . Robotic assisted laparoscopic sacrocolpopexy  09/2009    Dr. Matilde Sprang  . Cataract extraction w/ intraocular lens  implant, bilateral  2012    Outpatient Encounter Prescriptions as of 01/04/2014  Medication Sig  . ALPRAZolam (XANAX) 0.5 MG tablet 1/2 to 1 tablet by mouth 3  times a day for nerves  . atenolol (TENORMIN) 50 MG tablet Take 50 mg by mouth daily.   . Blood Glucose Monitoring Suppl (ACCU-CHEK AVIVA PLUS) W/DEVICE KIT Test blood sugar once daily  . dextromethorphan-guaiFENesin (MUCINEX DM) 30-600 MG per 12 hr tablet Take 1 tablet by mouth. 1-2 TIMES DAILY AS NEEDED   . Diphenhyd-Hydrocort-Nystatin (FIRST-DUKES MOUTHWASH) SUSP 1 teaspoon by mouth gargle and swallow four times daily as needed  . estradiol (ESTRACE) 0.1 MG/GM vaginal cream Place 2 g vaginally daily.  [name] glucose blood (ACCU-CHEK AVIVA) test strip Check blood sugar once daily  . metFORMIN (GLUCOPHAGE) 500 MG tablet TAKE 1 TABLET DAILY WITH BREAKFAST  . Multiple Vitamins-Minerals (MULTIVITAMIN & MINERAL PO) Take 1 tablet by mouth daily.  [name] olmesartan-hydrochlorothiazide (BENICAR HCT) 40-25 MG per tablet Take 1 tablet by mouth daily.    [name] omeprazole (PRILOSEC) 20 MG capsule Take 20 mg by mouth daily. 30 minutes prior to a meal  . sertraline (ZOLOFT) 50 MG tablet Take 1 tablet (50 mg total) by mouth daily.  . traMADol (ULTRAM) 50 MG tablet Take 2 tablets (100 mg total) by mouth every 8 (eight) hours as needed.  . [DISCONTINUED] nystatin (MYCOSTATIN) 100000 UNIT/ML suspension GARGLE AND SWALLOW 1  TEASPOONFUL BY MOUTH 4 TIMES DAILY AS NEEDED.    Allergies  Allergen Reactions  . Fish Allergy Hives  . Shellfish Allergy Hives  . Penicillins     REACTION: red rash    Current Medications, Allergies, Past Medical History, Past Surgical History, Family History, and Social History were reviewed in [employer] record.    Review of Systems        See HPI - all other systems neg except as noted...  The patient complains of decreased hearing, dyspnea on exertion, muscle weakness, difficulty walking, and depression.  The patient denies anorexia, fever, weight loss, weight gain, vision loss, hoarseness, chest pain, syncope, peripheral edema, prolonged cough, headaches, hemoptysis,  abdominal pain, melena, hematochezia, severe indigestion/heartburn, hematuria, incontinence, suspicious skin lesions, transient blindness, unusual weight change, abnormal bleeding, enlarged lymph nodes, and angioedema.    Objective:   Physical Exam     WD, WN, 73 y/o BF in NAD... GENERAL:  Alert & oriented; pleasant & cooperative... she has mult trigger points bilat... HEENT:  [place]/AT, EOM-wnl, PERRLA, EACs-clear, TMs-wnl, NOSE-clear, THROAT-clear & wnl. NECK:  Supple w/ fairROM; no JVD; normal carotid impulses w/o bruits; no thyromegaly or nodules palpated; no lymphadenopathy. CHEST:  few scat rhonchi bilat without rales or consolidation... HEART:  Regular Rhythm; without murmurs/ rubs/ or gallops. ABDOMEN:  Soft & nontender; normal bowel sounds; no organomegaly or masses detected. EXT: without deformities, mild arthritic changes; no varicose veins/ +venous insuffic/ no edema. NEURO:  CN's intact; no focal neuro deficits... DERM:  No lesions noted; no rash etc...  RADIOLOGY DATA:  Reviewed in the EPIC EMR & discussed w/ the patient...  LABORATORY DATA:  Reviewed in the EPIC EMR & discussed w/ the patient...   Assessment & Plan:    DM> prev improved on Metformin $RemoveBefo'500mg'bxZDfJkaUIy$ /d w/ labs 3/15 showing BS=102, A1c=6.8; follow up labs 7/15- pending, she wants to see a diabetic specialist & we will make a referral...   HBP> on Atenolol & BenicarHCT;  Followed for Cards by [facility];  BP has been easily controlled when she takes meds regularly; reminded to take every day!     Ven Insuffic> on low sodium diet, elevation, support hose; wt=188# which is down5# & reminded of diet, no salt, exercise...     Chol> on Diet alone & managed by [facility]; FLP here 3/15 was ok x LDL 122.     Divertics, Constip> on OTC laxatives as needed...     Vag prolapse> followed by Urology s/p surg...     DJD. FM, Somatic dysfunction> as above- and she is reminded of the importance of gentle stretching exercises  etc...     Anxiety> on Alprazolam as needed & encouraged to use it more regularly...     Sickle Trait> aware- CBC is wnl...   Patient's Medications  New Prescriptions   No medications on file  Previous Medications   ALPRAZOLAM (XANAX) 0.5 MG TABLET    1/2 to 1 tablet by mouth 3 times a day for nerves   BLOOD GLUCOSE MONITORING SUPPL (ACCU-CHEK AVIVA PLUS) W/DEVICE KIT    Test blood sugar once daily   DEXTROMETHORPHAN-GUAIFENESIN (MUCINEX DM) 30-600 MG PER 12 HR TABLET    Take 1 tablet by mouth. 1-2 TIMES DAILY AS NEEDED    DIPHENHYD-HYDROCORT-NYSTATIN (FIRST-DUKES MOUTHWASH) SUSP    1 teaspoon by mouth gargle and swallow four times daily as needed   ESTRADIOL (ESTRACE) 0.1 MG/GM VAGINAL CREAM    Place 2 g vaginally daily.  METFORMIN (GLUCOPHAGE) 500 MG TABLET    TAKE 1 TABLET DAILY WITH BREAKFAST   MULTIPLE VITAMINS-MINERALS (MULTIVITAMIN & MINERAL PO)    Take 1 tablet by mouth daily.   OLMESARTAN-HYDROCHLOROTHIAZIDE (BENICAR HCT) 40-25 MG PER TABLET    Take 1 tablet by mouth daily.     OMEPRAZOLE (PRILOSEC) 20 MG CAPSULE    Take 20 mg by mouth daily. 30 minutes prior to a meal   SERTRALINE (ZOLOFT) 50 MG TABLET    Take 1 tablet (50 mg total) by mouth daily.   TRAMADOL (ULTRAM) 50 MG TABLET    Take 2 tablets (100 mg total) by mouth every 8 (eight) hours as needed.  Modified Medications   Modified Medication Previous Medication   ATENOLOL (TENORMIN) 50 MG TABLET atenolol (TENORMIN) 50 MG tablet      Take 1 tablet (50 mg total) by mouth daily.    Take 50 mg by mouth daily.    GLUCOSE BLOOD (ACCU-CHEK AVIVA) TEST STRIP glucose blood (ACCU-CHEK AVIVA) test strip      Check blood sugar once daily    Check blood sugar once daily  Discontinued Medications   NYSTATIN (MYCOSTATIN) 100000 UNIT/ML SUSPENSION    GARGLE AND SWALLOW 1 TEASPOONFUL BY MOUTH 4 TIMES DAILY AS NEEDED.

## 2014-01-04 NOTE — Patient Instructions (Signed)
Today we updated your med list in our EPIC system...    Continue your current medications the same...  Today we rechecked your Diabetic labs...    We will contact you w/ the results when available...   We will arrange for an endocrine consult for your Diabetes as discusse4d...  Remember to follow a low carb diet- no sugars, no sweets, & work on weight reduction...  For your constipation>>     Try the [facility]- one capful in water daily    And use SENAKOT-S 1-2 tabs each night at bedtime...  For the Venous insufficiency>    No salt!!!    Elevate your legs...    Wear support hose when needed...  Call for any questions or if we can be of service in any way.Marland KitchenMarland Kitchen

## 2014-01-08 ENCOUNTER — Other Ambulatory Visit (INDEPENDENT_AMBULATORY_CARE_PROVIDER_SITE_OTHER): Payer: Medicare Other

## 2014-01-08 DIAGNOSIS — R7309 Other abnormal glucose: Secondary | ICD-10-CM

## 2014-01-08 LAB — HEMOGLOBIN A1C: HEMOGLOBIN A1C: 7.2 % — AB (ref 4.6–6.5)

## 2014-01-08 LAB — BASIC METABOLIC PANEL
BUN: 23 mg/dL (ref 6–23)
CALCIUM: 9.3 mg/dL (ref 8.4–10.5)
CO2: 26 meq/L (ref 19–32)
CREATININE: 1.2 mg/dL (ref 0.4–1.2)
Chloride: 107 mEq/L (ref 96–112)
GFR: 55.37 mL/min — ABNORMAL LOW (ref >60.00)
Glucose, Bld: 144 mg/dL — ABNORMAL HIGH (ref 70–99)
Potassium: 4.2 mEq/L (ref 3.5–5.1)
SODIUM: 142 meq/L (ref 135–145)

## 2014-01-10 ENCOUNTER — Ambulatory Visit: Payer: Medicare Other | Admitting: Endocrinology

## 2014-01-18 ENCOUNTER — Ambulatory Visit
Admission: RE | Admit: 2014-01-18 | Discharge: 2014-01-18 | Disposition: A | Discharge status: Home / Self Care | Payer: Medicare Other | Source: Ambulatory Visit | Attending: Otolaryngology | Admitting: Otolaryngology

## 2014-01-18 DIAGNOSIS — J329 Chronic sinusitis, unspecified: Secondary | ICD-10-CM

## 2014-04-28 ENCOUNTER — Other Ambulatory Visit: Payer: Self-pay | Admitting: Pulmonary Disease

## 2014-05-14 ENCOUNTER — Telehealth: Payer: Self-pay | Admitting: Internal Medicine

## 2014-05-14 ENCOUNTER — Other Ambulatory Visit (INDEPENDENT_AMBULATORY_CARE_PROVIDER_SITE_OTHER): Payer: Medicare Other

## 2014-05-14 ENCOUNTER — Encounter: Payer: Self-pay | Admitting: Internal Medicine

## 2014-05-14 ENCOUNTER — Ambulatory Visit (INDEPENDENT_AMBULATORY_CARE_PROVIDER_SITE_OTHER): Payer: Medicare Other | Admitting: Internal Medicine

## 2014-05-14 VITALS — BP 146/82 | HR 76 | Temp 98.6°F | Resp 22 | Ht 67.0 in | Wt 194.0 lb

## 2014-05-14 DIAGNOSIS — E1169 Type 2 diabetes mellitus with other specified complication: Secondary | ICD-10-CM

## 2014-05-14 DIAGNOSIS — M150 Primary generalized (osteo)arthritis: Secondary | ICD-10-CM

## 2014-05-14 DIAGNOSIS — I10 Essential (primary) hypertension: Secondary | ICD-10-CM

## 2014-05-14 DIAGNOSIS — Z299 Encounter for prophylactic measures, unspecified: Secondary | ICD-10-CM

## 2014-05-14 DIAGNOSIS — M797 Fibromyalgia: Secondary | ICD-10-CM

## 2014-05-14 DIAGNOSIS — M159 Polyosteoarthritis, unspecified: Secondary | ICD-10-CM

## 2014-05-14 DIAGNOSIS — E669 Obesity, unspecified: Secondary | ICD-10-CM

## 2014-05-14 DIAGNOSIS — E119 Type 2 diabetes mellitus without complications: Secondary | ICD-10-CM

## 2014-05-14 DIAGNOSIS — M8949 Other hypertrophic osteoarthropathy, multiple sites: Secondary | ICD-10-CM

## 2014-05-14 DIAGNOSIS — E1140 Type 2 diabetes mellitus with diabetic neuropathy, unspecified: Secondary | ICD-10-CM

## 2014-05-14 DIAGNOSIS — Z418 Encounter for other procedures for purposes other than remedying health state: Secondary | ICD-10-CM

## 2014-05-14 DIAGNOSIS — Z23 Encounter for immunization: Secondary | ICD-10-CM

## 2014-05-14 LAB — URINALYSIS, ROUTINE W REFLEX MICROSCOPIC
Bilirubin Urine: NEGATIVE
Hgb urine dipstick: NEGATIVE
KETONES UR: NEGATIVE
Leukocytes, UA: NEGATIVE
Nitrite: NEGATIVE
RBC / HPF: NONE SEEN (ref 0–?)
Specific Gravity, Urine: 1.025 (ref 1.000–1.030)
Total Protein, Urine: 30 — AB
URINE GLUCOSE: NEGATIVE
UROBILINOGEN UA: 0.2 (ref 0.0–1.0)
pH: 6 (ref 5.0–8.0)

## 2014-05-14 LAB — HEMOGLOBIN A1C: Hgb A1c MFr Bld: 7.5 % — ABNORMAL HIGH (ref 4.6–6.5)

## 2014-05-14 LAB — MICROALBUMIN / CREATININE URINE RATIO
MICROALB UR: 48.8 mg/dL — AB (ref 0.0–1.9)
Microalb Creat Ratio: 93.2 mg/g — ABNORMAL HIGH (ref 0.0–30.0)

## 2014-05-14 NOTE — Progress Notes (Signed)
   Subjective:    Patient ID: Kayla [name], female    DOB: [birth year], 73 y.o.   MRN: [id number]  HPI The patient is a 73 old patient who is new to this office today she does have past medical history of rheumatic, anxiety, hyper tension, hyperlipidemia, type 2 diabetes with complication. She does have neuropathy in her feet however denies any injuries. She is seeing ophthalmology however has not seen them in about a year. She denies any hypo-or hyperglycemia. She denies any chest pain, shortness of breath, abdominal pain.  Review of Systems  Constitutional: Negative for fever, activity change, appetite change, fatigue and unexpected weight change.  HENT: Negative.   Eyes: Negative.   Respiratory: Negative for cough, chest tightness, shortness of breath and wheezing.   Cardiovascular: Negative for chest pain, palpitations and leg swelling.  Gastrointestinal: Negative for nausea, abdominal pain, diarrhea, constipation and abdominal distention.  Musculoskeletal: Positive for arthralgias. Negative for myalgias and back pain.  Skin: Negative.   Neurological: Negative for dizziness, weakness, light-headedness and headaches.      Objective:   Physical Exam  Constitutional: She is oriented to person, place, and time. She appears well-developed and well-nourished.  HENT:  Head: Normocephalic and atraumatic.  Eyes: EOM are normal.  Neck: Normal range of motion.  Cardiovascular: Normal rate and regular rhythm.   Pulmonary/Chest: Effort normal and breath sounds normal. No respiratory distress. She has no wheezes. She has no rales.  Abdominal: Soft. Bowel sounds are normal. She exhibits no distension. There is no tenderness. There is no rebound.  Neurological: She is alert and oriented to person, place, and time. Coordination normal.  Skin: Skin is warm and dry.   Filed Vitals:   05/14/14 1007  BP: 146/82  Pulse: 76  Temp: 98.6 F (37 C)  TempSrc: Oral  Resp: 22  Height: 5\' 7"  (1.702 m)    Weight: 194 lb (87.998 kg)  SpO2: 95%      Assessment & Plan:  Patient declines flu shot however received Tdap.

## 2014-05-14 NOTE — Assessment & Plan Note (Signed)
Mostly pain in her right leg. She does have history of injury to that region. She is able to use OTC medications at this time.

## 2014-05-14 NOTE — Assessment & Plan Note (Signed)
She is doing well on ARB/HCTZ as well as atenolol.

## 2014-05-14 NOTE — Telephone Encounter (Signed)
No we did not make any change to her medicines, we will wait on her lab results to be sure.

## 2014-05-14 NOTE — Progress Notes (Signed)
Pre visit review using our clinic review tool, if applicable. No additional management support is needed unless otherwise documented below in the visit note. 

## 2014-05-14 NOTE — Assessment & Plan Note (Signed)
She is not currently on any medication is doing fairly well with pain levels. She has been on Zoloft in the past with moderate success.

## 2014-05-14 NOTE — Telephone Encounter (Signed)
She was just here today. Did you make any changes?

## 2014-05-14 NOTE — Telephone Encounter (Signed)
I spoke with patient and informed her that we will wait for the lab results to come back to see if any medication changes need to be made.

## 2014-05-14 NOTE — Telephone Encounter (Signed)
Patient would like a follow up call.  She did not know if Dr. Doug Sou wanted to change her meds.

## 2014-05-14 NOTE — Assessment & Plan Note (Signed)
She does still have full sensation in her feet however occasionally gets neuropathic pains. She is not require medication for that at this time. She continues metformin 500 mg daily. Check hemoglobin A1c, check microalbumin to creatinine ratio. Foot exam performed. She is on ARB therapy

## 2014-05-14 NOTE — Patient Instructions (Signed)
We will check your blood today as well as your urine. We will call you back with those results.   We will see back in about 6 months for a checkup of your diabetes. If you have any problems before then please feel free to call us.   It may be worthwhile for you to try and find some exercise that you can do, water aerobics is very good on your joints and easy on her back. Your insurance would pay for you to go to a gym that has a pool.

## 2014-06-07 LAB — HM DIABETES EYE EXAM

## 2014-07-27 ENCOUNTER — Other Ambulatory Visit: Payer: Self-pay | Admitting: Pulmonary Disease

## 2014-07-29 ENCOUNTER — Telehealth: Payer: Self-pay | Admitting: Internal Medicine

## 2014-08-06 NOTE — Telephone Encounter (Signed)
Sent in

## 2014-08-06 NOTE — Telephone Encounter (Signed)
Pt called to check up on this request. Please check

## 2014-08-18 ENCOUNTER — Encounter (HOSPITAL_COMMUNITY): Payer: Self-pay | Admitting: Emergency Medicine

## 2014-08-18 ENCOUNTER — Emergency Department (HOSPITAL_COMMUNITY): Payer: Medicare HMO

## 2014-08-18 ENCOUNTER — Emergency Department (HOSPITAL_COMMUNITY)
Admission: EM | Admit: 2014-08-18 | Discharge: 2014-08-18 | Disposition: A | Discharge status: Home / Self Care | Payer: Medicare HMO | Attending: Emergency Medicine | Admitting: Emergency Medicine

## 2014-08-18 DIAGNOSIS — Z79899 Other long term (current) drug therapy: Secondary | ICD-10-CM | POA: Insufficient documentation

## 2014-08-18 DIAGNOSIS — Z862 Personal history of diseases of the blood and blood-forming organs and certain disorders involving the immune mechanism: Secondary | ICD-10-CM | POA: Diagnosis not present

## 2014-08-18 DIAGNOSIS — Z8659 Personal history of other mental and behavioral disorders: Secondary | ICD-10-CM | POA: Insufficient documentation

## 2014-08-18 DIAGNOSIS — I10 Essential (primary) hypertension: Secondary | ICD-10-CM | POA: Diagnosis not present

## 2014-08-18 DIAGNOSIS — Z880 Allergy status to penicillin: Secondary | ICD-10-CM | POA: Insufficient documentation

## 2014-08-18 DIAGNOSIS — Z8742 Personal history of other diseases of the female genital tract: Secondary | ICD-10-CM | POA: Insufficient documentation

## 2014-08-18 DIAGNOSIS — Z8739 Personal history of other diseases of the musculoskeletal system and connective tissue: Secondary | ICD-10-CM | POA: Diagnosis not present

## 2014-08-18 DIAGNOSIS — R05 Cough: Secondary | ICD-10-CM | POA: Diagnosis present

## 2014-08-18 DIAGNOSIS — R911 Solitary pulmonary nodule: Secondary | ICD-10-CM | POA: Diagnosis not present

## 2014-08-18 DIAGNOSIS — E119 Type 2 diabetes mellitus without complications: Secondary | ICD-10-CM | POA: Diagnosis not present

## 2014-08-18 DIAGNOSIS — J40 Bronchitis, not specified as acute or chronic: Secondary | ICD-10-CM

## 2014-08-18 DIAGNOSIS — R51 Headache: Secondary | ICD-10-CM | POA: Insufficient documentation

## 2014-08-18 DIAGNOSIS — J45901 Unspecified asthma with (acute) exacerbation: Secondary | ICD-10-CM | POA: Insufficient documentation

## 2014-08-18 DIAGNOSIS — Z8719 Personal history of other diseases of the digestive system: Secondary | ICD-10-CM | POA: Diagnosis not present

## 2014-08-18 LAB — CBC
HCT: 45.9 % (ref 36.0–46.0)
Hemoglobin: 14.7 g/dL (ref 12.0–15.0)
MCH: 28.6 pg (ref 26.0–34.0)
MCHC: 32 g/dL (ref 30.0–36.0)
MCV: 89.3 fL (ref 78.0–100.0)
Platelets: 249 10*3/uL (ref 150–400)
RBC: 5.14 MIL/uL — ABNORMAL HIGH (ref 3.87–5.11)
RDW: 15.5 % (ref 11.5–15.5)
WBC: 5.1 10*3/uL (ref 4.0–10.5)

## 2014-08-18 LAB — BASIC METABOLIC PANEL
Anion gap: 8 (ref 5–15)
BUN: 15 mg/dL (ref 6–23)
CALCIUM: 8.8 mg/dL (ref 8.4–10.5)
CHLORIDE: 102 mmol/L (ref 96–112)
CO2: 28 mmol/L (ref 19–32)
Creatinine, Ser: 1.09 mg/dL (ref 0.50–1.10)
GFR calc non Af Amer: 49 mL/min — ABNORMAL LOW (ref >90)
GFR, EST AFRICAN AMERICAN: 57 mL/min — AB (ref >90)
Glucose, Bld: 250 mg/dL — ABNORMAL HIGH (ref 70–99)
POTASSIUM: 3.7 mmol/L (ref 3.5–5.1)
Sodium: 138 mmol/L (ref 135–145)

## 2014-08-18 MED ORDER — IPRATROPIUM-ALBUTEROL 0.5-2.5 (3) MG/3ML IN SOLN
3.0000 mL | Freq: Once | RESPIRATORY_TRACT | Status: AC
  Administered 2014-08-18: 3 mL via RESPIRATORY_TRACT

## 2014-08-18 MED ORDER — ALBUTEROL SULFATE HFA 108 (90 BASE) MCG/ACT IN AERS
2.0000 | INHALATION_SPRAY | Freq: Once | RESPIRATORY_TRACT | Status: AC
  Administered 2014-08-18: 2 via RESPIRATORY_TRACT
  Filled 2014-08-18: qty 6.7

## 2014-08-18 MED ORDER — BENZONATATE 100 MG PO CAPS: 100.0000 mg | ORAL_CAPSULE | Freq: Three times a day (TID) | ORAL | Status: DC | PRN

## 2014-08-18 NOTE — ED Provider Notes (Signed)
CSN: [id number]     Arrival date & time 08/18/14  1056 History   First MD Initiated Contact with Patient 08/18/14 1206     Chief Complaint  Patient presents with  . Cough  . Headache     (Consider location/radiation/quality/duration/timing/severity/associated sxs/prior Treatment) HPI  74 year old female presents with cough and congestion for 1 week. She saw her PCP was put on azithromycin which she finished yesterday. She still having chest congestion so she was told to come to the ER for symptoms not improved. She had fevers early on but denies any now. No shortness of breath. Occasional wheezing. Has had an intermittent headache but does not have one now. Patient has a history of asthmatic bronchitis, denies a history of COPD. Has not taken any albuterol.  Past Medical History  Diagnosis Date  . Asthmatic bronchitis   . Hypertension   . Venous insufficiency   . Diabetes mellitus   . Hypercholesteremia   . Diverticulosis of colon   . Constipation   . Prolapse of vaginal vault after hysterectomy   . Proteinuria   . DJD (degenerative joint disease)   . Fibromyalgia   . Somatic dysfunction   . Anxiety   . Personal history of noncompliance with medical treatment, presenting hazards to health   . Sickle-cell trait   . Allergy     Shell fish  . GERD (gastroesophageal reflux disease)    Past Surgical History  Procedure Laterality Date  . Abdominal hysterectomy    . Robotic assisted laparoscopic sacrocolpopexy  09/2009    Dr. Matilde Sprang  . Cataract extraction w/ intraocular lens  implant, bilateral  2012   Family History  Problem Relation Age of Onset  . Prostate cancer Father    History  Substance Use Topics  . Smoking status: Never Smoker   . Smokeless tobacco: Never Used  . Alcohol Use: Yes     Comment: rarely wine   OB History    No data available     Review of Systems  Constitutional: Negative for fever.  HENT: Positive for congestion.   Respiratory: Positive  for cough and wheezing. Negative for shortness of breath.   Neurological: Positive for headaches. Negative for weakness.  All other systems reviewed and are negative.     Allergies  Fish allergy; Shellfish allergy; and Penicillins  Home Medications   Prior to Admission medications   Medication Sig Start Date End Date Taking? Authorizing Provider  atenolol (TENORMIN) 50 MG tablet Take 1 tablet (50 mg total) by mouth daily. 01/04/14  Yes Noralee Space, MD  metFORMIN (GLUCOPHAGE) 500 MG tablet TAKE 1 TABLET DAILY WITH BREAKFAST 08/06/14  Yes Olga Millers, MD  olmesartan-hydrochlorothiazide (BENICAR HCT) 40-25 MG per tablet Take 1 tablet by mouth daily.     Yes Historical Provider, MD  Blood Glucose Monitoring Suppl (ACCU-CHEK AVIVA PLUS) W/DEVICE KIT Test blood sugar once daily 09/13/13   Noralee Space, MD  Diphenhyd-Hydrocort-Nystatin (FIRST-DUKES MOUTHWASH) SUSP 1 teaspoon by mouth gargle and swallow four times daily as needed 07/12/13   Noralee Space, MD  glucose blood (ACCU-CHEK AVIVA) test strip Check blood sugar once daily 01/04/14   Noralee Space, MD   BP 169/80 mmHg  Pulse 80  Temp(Src) 98.1 F (36.7 C) (Oral)  Resp 18  SpO2 92% Physical Exam  Constitutional: She is oriented to person, place, and time. She appears well-developed and well-nourished.  HENT:  Head: Normocephalic and atraumatic.  Right Ear: External ear normal.  Left  Ear: External ear normal.  Nose: Nose normal.  Eyes: Right eye exhibits no discharge. Left eye exhibits no discharge.  Cardiovascular: Normal rate, regular rhythm and normal heart sounds.   Pulmonary/Chest: Effort normal. No accessory muscle usage. No tachypnea. No respiratory distress. She has wheezes in the right lower field and the left lower field. She has rhonchi in the right lower field and the left lower field.  Mild rhonchi and wheezing on expiration at the lung bases  Abdominal: Soft. She exhibits no distension. There is no tenderness.   Neurological: She is alert and oriented to person, place, and time.  Skin: Skin is warm and dry.  Nursing note and vitals reviewed.   ED Course  Procedures (including critical care time) Labs Review Labs Reviewed  BASIC METABOLIC PANEL - Abnormal; Notable for the following:    Glucose, Bld 250 (*)    GFR calc non Af Amer 49 (*)    GFR calc Af Amer 57 (*)    All other components within normal limits  CBC - Abnormal; Notable for the following:    RBC 5.14 (*)    All other components within normal limits    Imaging Review Dg Chest 2 View (if Patient Has Fever And/or Copd)  08/18/2014   CLINICAL DATA:  73-year-old female with cough, congestion and wheezing for the past week  EXAM: CHEST  2 VIEW  COMPARISON:  Prior chest x-ray 11/30/2010  FINDINGS: Stable cardiac and mediastinal contours which remain within normal limits. No evidence of focal airspace consolidation, pulmonary edema, pleural effusion or pneumothorax. Stable central bronchitic change, interstitial prominence and slight hyper expansion. New nodular opacity in the periphery of the inferior left lung on the frontal view compared to June of 2012. No acute osseous abnormality.  IMPRESSION: 1. No evidence of acute cardiopulmonary process. 2. Interval development of a nodular opacity in the lateral aspect of the left lung base on the frontal view. This finding is concerning for a pulmonary nodule and primary bronchogenic carcinoma. Recommend further evaluation with nonemergent CT scan of the chest. 3. Background bronchitic change and mild interstitial prominence suggests underlying COPD.   Electronically Signed   By: Heath  McCullough M.D.   On: 08/18/2014 12:51     EKG Interpretation None      MDM   Final diagnoses:  Bronchitis  Pulmonary nodule    Feels better with one albuterol neb, will d/c with inhaler. No signs of pneumonia on CT and has already been on antibiotics. C/o headache but none now and no neuro deficits.  Likely viral URI related vs coughing. No indication for CT at this time. Made aware of pulmonary nodule, will f/u with PCP for this. Stable for D/C.     T , MD 08/18/14 1747 

## 2014-08-18 NOTE — Discharge Instructions (Signed)
You can use the albuterol inhaler 2 puffs every 4 hours as needed for wheezing or shortness of breath. Follow up as soon as possible with your primary doctor for follow up of your lung nodule.  Acute Bronchitis Bronchitis is inflammation of the airways that extend from the windpipe into the lungs (bronchi). The inflammation often causes mucus to develop. This leads to a cough, which is the most common symptom of bronchitis.  In acute bronchitis, the condition usually develops suddenly and goes away over time, usually in a couple weeks. Smoking, allergies, and asthma can make bronchitis worse. Repeated episodes of bronchitis may cause further lung problems.  CAUSES Acute bronchitis is most often caused by the same virus that causes a cold. The virus can spread from person to person (contagious) through coughing, sneezing, and touching contaminated objects. SIGNS AND SYMPTOMS   Cough.   Fever.   Coughing up mucus.   Body aches.   Chest congestion.   Chills.   Shortness of breath.   Sore throat.  DIAGNOSIS  Acute bronchitis is usually diagnosed through a physical exam. Your health care provider will also ask you questions about your medical history. Tests, such as chest X-rays, are sometimes done to rule out other conditions.  TREATMENT  Acute bronchitis usually goes away in a couple weeks. Oftentimes, no medical treatment is necessary. Medicines are sometimes given for relief of fever or cough. Antibiotic medicines are usually not needed but may be prescribed in certain situations. In some cases, an inhaler may be recommended to help reduce shortness of breath and control the cough. A cool mist vaporizer may also be used to help thin bronchial secretions and make it easier to clear the chest.  HOME CARE INSTRUCTIONS  Get plenty of rest.   Drink enough fluids to keep your urine clear or pale yellow (unless you have a medical condition that requires fluid restriction). Increasing  fluids may help thin your respiratory secretions (sputum) and reduce chest congestion, and it will prevent dehydration.   Take medicines only as directed by your health care provider.  If you were prescribed an antibiotic medicine, finish it all even if you start to feel better.  Avoid smoking and secondhand smoke. Exposure to cigarette smoke or irritating chemicals will make bronchitis worse. If you are a smoker, consider using nicotine gum or skin patches to help control withdrawal symptoms. Quitting smoking will help your lungs heal faster.   Reduce the chances of another bout of acute bronchitis by washing your hands frequently, avoiding people with cold symptoms, and trying not to touch your hands to your mouth, nose, or eyes.   Keep all follow-up visits as directed by your health care provider.  SEEK MEDICAL CARE IF: Your symptoms do not improve after 1 week of treatment.  SEEK IMMEDIATE MEDICAL CARE IF:  You develop an increased fever or chills.   You have chest pain.   You have severe shortness of breath.  You have bloody sputum.   You develop dehydration.  You faint or repeatedly feel like you are going to pass out.  You develop repeated vomiting.  You develop a severe headache. MAKE SURE YOU:   Understand these instructions.  Will watch your condition.  Will get help right away if you are not doing well or get worse. Document Released: 07/15/2004 Document Revised: 10/22/2013 Document Reviewed: 11/28/2012 [name] Patient Information 2015 [place], [place]. This information is not intended to replace advice given to you by your health care provider.  Make sure you discuss any questions you have with your health care provider.   Pulmonary Nodule A pulmonary nodule is a small, round growth of tissue in the lung. Pulmonary nodules can range in size from less than 1/5 inch (4 mm) to a little bigger than an inch (25 mm). Most pulmonary nodules are detected when  imaging tests of the lung are being performed for a different problem. Pulmonary nodules are usually not cancerous (benign). However, some pulmonary nodules are cancerous (malignant). Follow-up treatment or testing is based on the size of the pulmonary nodule and your risk of getting lung cancer.  CAUSES Benign pulmonary nodules can be caused by various things. Some of the causes include:   Bacterial, fungal, or viral infections. This is usually an old infection that is no longer active, but it can sometimes be a current, active infection.  A benign mass of tissue.  Inflammation from conditions such as rheumatoid arthritis.   Abnormal blood vessels in the lungs. Malignant pulmonary nodules can result from lung cancer or from cancers that spread to the lung from other places in the body. SIGNS AND SYMPTOMS Pulmonary nodules usually do not cause symptoms. DIAGNOSIS Most often, pulmonary nodules are found incidentally when an X-ray or CT scan is performed to look for some other problem in the lung area. To help determine whether a pulmonary nodule is benign or malignant, your health care provider will take a medical history and order a variety of tests. Tests done may include:   Blood tests.  A skin test called a tuberculin test. This test is used to determine if you have been exposed to the germ that causes tuberculosis.   Chest X-rays. If possible, a new X-ray may be compared with X-rays you have had in the past.   CT scan. This test shows smaller pulmonary nodules more clearly than an X-ray.   Positron emission tomography (PET) scan. In this test, a safe amount of a radioactive substance is injected into the bloodstream. Then, the scan takes a picture of the pulmonary nodule. The radioactive substance is eliminated from your body in your urine.   Biopsy. A tiny piece of the pulmonary nodule is removed so it can be checked under a microscope. TREATMENT  Pulmonary nodules that are  benign normally do not require any treatment because they usually do not cause symptoms or breathing problems. Your health care provider may want to monitor the pulmonary nodule through follow-up CT scans. The frequency of these CT scans will vary based on the size of the nodule and the risk factors for lung cancer. For example, CT scans will need to be done more frequently if the pulmonary nodule is larger and if you have a history of smoking and a family history of cancer. Further testing or biopsies may be done if any follow-up CT scan shows that the size of the pulmonary nodule has increased. HOME CARE INSTRUCTIONS  Only take over-the-counter or prescription medicines as directed by your health care provider.  Keep all follow-up appointments with your health care provider. SEEK MEDICAL CARE IF:  You have trouble breathing when you are active.   You feel sick or unusually tired.   You do not feel like eating.   You lose weight without trying to.   You develop chills or night sweats.  SEEK IMMEDIATE MEDICAL CARE IF:  You cannot catch your breath, or you begin wheezing.   You cannot stop coughing.   You cough up blood.  You become dizzy or feel like you are going to pass out.   You have sudden chest pain.   You have a fever or persistent symptoms for more than 2-3 days.   You have a fever and your symptoms suddenly get worse. MAKE SURE YOU:  Understand these instructions.  Will watch your condition.  Will get help right away if you are not doing well or get worse. Document Released: 04/04/2009 Document Revised: 02/07/2013 Document Reviewed: 11/27/2012 [facility] Patient Information 2015 [place], [place]. This information is not intended to replace advice given to you by your health care provider. Make sure you discuss any questions you have with your health care provider.   General Headache Without Cause A headache is pain or discomfort felt around the head or  neck area. The specific cause of a headache may not be found. There are many causes and types of headaches. A few common ones are:  Tension headaches.  Migraine headaches.  Cluster headaches.  Chronic daily headaches. HOME CARE INSTRUCTIONS   Keep all follow-up appointments with your caregiver or any specialist referral.  Only take over-the-counter or prescription medicines for pain or discomfort as directed by your caregiver.  Lie down in a dark, quiet room when you have a headache.  Keep a headache journal to find out what may trigger your migraine headaches. For example, write down:  What you eat and drink.  How much sleep you get.  Any change to your diet or medicines.  Try massage or other relaxation techniques.  Put ice packs or heat on the head and neck. Use these 3 to 4 times per day for 15 to 20 minutes each time, or as needed.  Limit stress.  Sit up straight, and do not tense your muscles.  Quit smoking if you smoke.  Limit alcohol use.  Decrease the amount of caffeine you drink, or stop drinking caffeine.  Eat and sleep on a regular schedule.  Get 7 to 9 hours of sleep, or as recommended by your caregiver.  Keep lights dim if bright lights bother you and make your headaches worse. SEEK MEDICAL CARE IF:   You have problems with the medicines you were prescribed.  Your medicines are not working.  You have a change from the usual headache.  You have nausea or vomiting. SEEK IMMEDIATE MEDICAL CARE IF:   Your headache becomes severe.  You have a fever.  You have a stiff neck.  You have loss of vision.  You have muscular weakness or loss of muscle control.  You start losing your balance or have trouble walking.  You feel faint or pass out.  You have severe symptoms that are different from your first symptoms. MAKE SURE YOU:   Understand these instructions.  Will watch your condition.  Will get help right away if you are not doing well  or get worse. Document Released: 06/07/2005 Document Revised: 08/30/2011 Document Reviewed: 06/23/2011 [facility] Patient Information 2015 [place], [place]. This information is not intended to replace advice given to you by your health care provider. Make sure you discuss any questions you have with your health care provider.

## 2014-08-18 NOTE — ED Notes (Signed)
Pt from home c/o cough, shortness of breath, and headache x 4 days.

## 2014-08-18 NOTE — ED Notes (Signed)
Called RT for neb.

## 2014-08-20 ENCOUNTER — Ambulatory Visit (INDEPENDENT_AMBULATORY_CARE_PROVIDER_SITE_OTHER): Payer: Medicare HMO | Admitting: Internal Medicine

## 2014-08-20 ENCOUNTER — Encounter: Payer: Self-pay | Admitting: Internal Medicine

## 2014-08-20 VITALS — BP 138/86 | HR 72 | Temp 98.2°F | Resp 14 | Ht 66.0 in | Wt 188.0 lb

## 2014-08-20 DIAGNOSIS — R9389 Abnormal findings on diagnostic imaging of other specified body structures: Secondary | ICD-10-CM

## 2014-08-20 DIAGNOSIS — R938 Abnormal findings on diagnostic imaging of other specified body structures: Secondary | ICD-10-CM

## 2014-08-20 MED ORDER — FIRST-DUKES MOUTHWASH MT SUSP: OROMUCOSAL | Status: DC

## 2014-08-20 NOTE — Progress Notes (Signed)
   Subjective:    Patient ID: Kayla [name], female    DOB: [birth year], 74 y.o.   MRN: [id number]  HPI The patient is a 74 YO female who is coming in for an ER follow up (seen 2 days ago for cough, with abnormal x-ray and needs CT scan). She is still having the cough and sometimes feels like she wheezes with breathing. She has not used the inhaler they gave her yet. She is still coughing. She did not understand what they told her in the ER and needed an explanation. Her daughter was present with her and helps to provide history. No pain with breathing. No SOB, no red sputum or rusty colored sputum. Denies weight loss.   Review of Systems  Constitutional: Negative for fever, activity change, appetite change, fatigue and unexpected weight change.  HENT: Negative.   Respiratory: Positive for cough and wheezing. Negative for chest tightness and shortness of breath.   Cardiovascular: Negative for chest pain, palpitations and leg swelling.  Gastrointestinal: Negative.   Musculoskeletal: Negative.       Objective:   Physical Exam  Constitutional: She is oriented to person, place, and time. She appears well-developed and well-nourished.  HENT:  Head: Normocephalic and atraumatic.  Eyes: EOM are normal.  Neck: Normal range of motion.  Cardiovascular: Normal rate and regular rhythm.   Pulmonary/Chest: Effort normal. She has wheezes.  Some scattered wheeze in the left lung  Abdominal: Soft.  Neurological: She is alert and oriented to person, place, and time. Coordination normal.  Skin: Skin is warm and dry.   Filed Vitals:   08/20/14 0922  BP: 138/86  Pulse: 72  Temp: 98.2 F (36.8 C)  TempSrc: Oral  Resp: 14  Height: 5\' 6"  (1.676 m)  Weight: 188 lb (85.276 kg)  SpO2: 93%      Assessment & Plan:  Visit time 25 minutes, greater than 50% of which was spent in face to face counseling with the patient and her daughter.

## 2014-08-20 NOTE — Patient Instructions (Signed)
We will get a CT of the chest ordered to check on that spot they found on the chest x-ray. It could be the cause of the wheezing.   We recommend using a heating pad to relax the muscles in the neck to help with the shooting pains in the head.   We will call you back with the results of the lung test when we get them back.

## 2014-08-20 NOTE — Progress Notes (Signed)
Pre visit review using our clinic review tool, if applicable. No additional management support is needed unless otherwise documented below in the visit note. 

## 2014-08-22 DIAGNOSIS — R9389 Abnormal findings on diagnostic imaging of other specified body structures: Secondary | ICD-10-CM | POA: Insufficient documentation

## 2014-08-22 NOTE — Assessment & Plan Note (Signed)
Nodule seen in the left lung not present on prior. Needs follow up with CT scan of the chest which was ordered at today's visit. Talked to family and patient about the fact that this could represent a nodule which could be growing or could represent an acute illness. Talked to them about the fact that acute illness is less likely and that solid tissue was the most likely.

## 2014-08-30 ENCOUNTER — Emergency Department (HOSPITAL_COMMUNITY): Payer: Medicare HMO

## 2014-08-30 ENCOUNTER — Ambulatory Visit: Payer: Medicare HMO | Admitting: Internal Medicine

## 2014-08-30 ENCOUNTER — Observation Stay (HOSPITAL_COMMUNITY): Payer: Medicare HMO

## 2014-08-30 ENCOUNTER — Inpatient Hospital Stay (HOSPITAL_COMMUNITY)
Admission: EM | Admit: 2014-08-30 | Discharge: 2014-09-05 | DRG: 065 | Disposition: A | Discharge status: Rehabilitation Facility | Payer: Medicare HMO | Attending: Internal Medicine | Admitting: Internal Medicine

## 2014-08-30 ENCOUNTER — Telehealth: Payer: Self-pay | Admitting: Internal Medicine

## 2014-08-30 ENCOUNTER — Encounter (HOSPITAL_COMMUNITY): Payer: Self-pay | Admitting: *Deleted

## 2014-08-30 ENCOUNTER — Inpatient Hospital Stay: Admission: RE | Admit: 2014-08-30 | Payer: Medicare HMO | Source: Ambulatory Visit

## 2014-08-30 DIAGNOSIS — R410 Disorientation, unspecified: Secondary | ICD-10-CM

## 2014-08-30 DIAGNOSIS — E1142 Type 2 diabetes mellitus with diabetic polyneuropathy: Secondary | ICD-10-CM | POA: Diagnosis present

## 2014-08-30 DIAGNOSIS — Z91013 Allergy to seafood: Secondary | ICD-10-CM

## 2014-08-30 DIAGNOSIS — I63431 Cerebral infarction due to embolism of right posterior cerebral artery: Secondary | ICD-10-CM | POA: Diagnosis present

## 2014-08-30 DIAGNOSIS — E1169 Type 2 diabetes mellitus with other specified complication: Secondary | ICD-10-CM | POA: Diagnosis present

## 2014-08-30 DIAGNOSIS — Z7982 Long term (current) use of aspirin: Secondary | ICD-10-CM

## 2014-08-30 DIAGNOSIS — Z833 Family history of diabetes mellitus: Secondary | ICD-10-CM

## 2014-08-30 DIAGNOSIS — I454 Nonspecific intraventricular block: Secondary | ICD-10-CM | POA: Diagnosis present

## 2014-08-30 DIAGNOSIS — I639 Cerebral infarction, unspecified: Secondary | ICD-10-CM

## 2014-08-30 DIAGNOSIS — Z880 Allergy status to penicillin: Secondary | ICD-10-CM

## 2014-08-30 DIAGNOSIS — I6523 Occlusion and stenosis of bilateral carotid arteries: Secondary | ICD-10-CM | POA: Diagnosis present

## 2014-08-30 DIAGNOSIS — I63411 Cerebral infarction due to embolism of right middle cerebral artery: Secondary | ICD-10-CM | POA: Diagnosis not present

## 2014-08-30 DIAGNOSIS — M797 Fibromyalgia: Secondary | ICD-10-CM | POA: Diagnosis present

## 2014-08-30 DIAGNOSIS — D573 Sickle-cell trait: Secondary | ICD-10-CM | POA: Diagnosis present

## 2014-08-30 DIAGNOSIS — Z91018 Allergy to other foods: Secondary | ICD-10-CM

## 2014-08-30 DIAGNOSIS — H53462 Homonymous bilateral field defects, left side: Secondary | ICD-10-CM | POA: Diagnosis present

## 2014-08-30 DIAGNOSIS — Z79899 Other long term (current) drug therapy: Secondary | ICD-10-CM

## 2014-08-30 DIAGNOSIS — I739 Peripheral vascular disease, unspecified: Secondary | ICD-10-CM | POA: Diagnosis present

## 2014-08-30 DIAGNOSIS — R911 Solitary pulmonary nodule: Secondary | ICD-10-CM | POA: Diagnosis present

## 2014-08-30 DIAGNOSIS — I10 Essential (primary) hypertension: Secondary | ICD-10-CM | POA: Diagnosis present

## 2014-08-30 DIAGNOSIS — E1140 Type 2 diabetes mellitus with diabetic neuropathy, unspecified: Secondary | ICD-10-CM

## 2014-08-30 DIAGNOSIS — I63531 Cerebral infarction due to unspecified occlusion or stenosis of right posterior cerebral artery: Secondary | ICD-10-CM | POA: Diagnosis present

## 2014-08-30 DIAGNOSIS — R4182 Altered mental status, unspecified: Secondary | ICD-10-CM | POA: Diagnosis not present

## 2014-08-30 DIAGNOSIS — E785 Hyperlipidemia, unspecified: Secondary | ICD-10-CM | POA: Diagnosis present

## 2014-08-30 DIAGNOSIS — I69354 Hemiplegia and hemiparesis following cerebral infarction affecting left non-dominant side: Secondary | ICD-10-CM

## 2014-08-30 DIAGNOSIS — N179 Acute kidney failure, unspecified: Secondary | ICD-10-CM | POA: Diagnosis present

## 2014-08-30 DIAGNOSIS — Z7951 Long term (current) use of inhaled steroids: Secondary | ICD-10-CM

## 2014-08-30 DIAGNOSIS — Z6830 Body mass index (BMI) 30.0-30.9, adult: Secondary | ICD-10-CM

## 2014-08-30 DIAGNOSIS — R29810 Facial weakness: Secondary | ICD-10-CM | POA: Diagnosis present

## 2014-08-30 DIAGNOSIS — Z823 Family history of stroke: Secondary | ICD-10-CM

## 2014-08-30 DIAGNOSIS — K5900 Constipation, unspecified: Secondary | ICD-10-CM | POA: Diagnosis present

## 2014-08-30 DIAGNOSIS — I63511 Cerebral infarction due to unspecified occlusion or stenosis of right middle cerebral artery: Secondary | ICD-10-CM | POA: Diagnosis not present

## 2014-08-30 DIAGNOSIS — E780 Pure hypercholesterolemia: Secondary | ICD-10-CM

## 2014-08-30 DIAGNOSIS — E669 Obesity, unspecified: Secondary | ICD-10-CM | POA: Diagnosis present

## 2014-08-30 DIAGNOSIS — F419 Anxiety disorder, unspecified: Secondary | ICD-10-CM | POA: Diagnosis present

## 2014-08-30 LAB — URINALYSIS, ROUTINE W REFLEX MICROSCOPIC
BILIRUBIN URINE: NEGATIVE
Glucose, UA: 250 mg/dL — AB
Hgb urine dipstick: NEGATIVE
Ketones, ur: NEGATIVE mg/dL
Leukocytes, UA: NEGATIVE
NITRITE: NEGATIVE
PROTEIN: NEGATIVE mg/dL
Specific Gravity, Urine: 1.016 (ref 1.005–1.030)
UROBILINOGEN UA: 1 mg/dL (ref 0.0–1.0)
pH: 7 (ref 5.0–8.0)

## 2014-08-30 LAB — CBC
HCT: 43.9 % (ref 36.0–46.0)
HCT: 42.2 % (ref 36.0–46.0)
Hemoglobin: 14.2 g/dL (ref 12.0–15.0)
Hemoglobin: 13.7 g/dL (ref 12.0–15.0)
MCH: 29 pg (ref 26.0–34.0)
MCH: 28.4 pg (ref 26.0–34.0)
MCHC: 32.3 g/dL (ref 30.0–36.0)
MCHC: 32.5 g/dL (ref 30.0–36.0)
MCV: 89.6 fL (ref 78.0–100.0)
MCV: 87.6 fL (ref 78.0–100.0)
PLATELETS: 244 10*3/uL (ref 150–400)
Platelets: 248 10*3/uL (ref 150–400)
RBC: 4.9 MIL/uL (ref 3.87–5.11)
RBC: 4.82 MIL/uL (ref 3.87–5.11)
RDW: 15.6 % — AB (ref 11.5–15.5)
RDW: 15.5 % (ref 11.5–15.5)
WBC: 5.4 10*3/uL (ref 4.0–10.5)
WBC: 5.5 10*3/uL (ref 4.0–10.5)

## 2014-08-30 LAB — COMPREHENSIVE METABOLIC PANEL
ALT: 13 U/L (ref 0–35)
ANION GAP: 8 (ref 5–15)
AST: 22 U/L (ref 0–37)
Albumin: 3.6 g/dL (ref 3.5–5.2)
Alkaline Phosphatase: 62 U/L (ref 39–117)
BUN: 21 mg/dL (ref 6–23)
CO2: 27 mmol/L (ref 19–32)
Calcium: 8.9 mg/dL (ref 8.4–10.5)
Chloride: 104 mmol/L (ref 96–112)
Creatinine, Ser: 1.08 mg/dL (ref 0.50–1.10)
GFR calc Af Amer: 57 mL/min — ABNORMAL LOW (ref >90)
GFR, EST NON AFRICAN AMERICAN: 49 mL/min — AB (ref >90)
Glucose, Bld: 207 mg/dL — ABNORMAL HIGH (ref 70–99)
POTASSIUM: 4 mmol/L (ref 3.5–5.1)
SODIUM: 139 mmol/L (ref 135–145)
Total Bilirubin: 0.4 mg/dL (ref 0.3–1.2)
Total Protein: 7.3 g/dL (ref 6.0–8.3)

## 2014-08-30 LAB — GLUCOSE, CAPILLARY
Glucose-Capillary: 112 mg/dL — ABNORMAL HIGH (ref 70–99)
Glucose-Capillary: 186 mg/dL — ABNORMAL HIGH (ref 70–99)

## 2014-08-30 LAB — CREATININE, SERUM
Creatinine, Ser: 1.22 mg/dL — ABNORMAL HIGH (ref 0.50–1.10)
GFR, EST AFRICAN AMERICAN: 49 mL/min — AB (ref >90)
GFR, EST NON AFRICAN AMERICAN: 43 mL/min — AB (ref >90)

## 2014-08-30 MED ORDER — STROKE: EARLY STAGES OF RECOVERY BOOK
Freq: Once | Status: AC
  Administered 2014-08-30: 18:00:00

## 2014-08-30 MED ORDER — ENOXAPARIN SODIUM 40 MG/0.4ML ~~LOC~~ SOLN
40.0000 mg | SUBCUTANEOUS | Status: DC
  Administered 2014-08-30 – 2014-09-04 (×6): 40 mg via SUBCUTANEOUS
  Filled 2014-08-30 (×6): qty 0.4

## 2014-08-30 MED ORDER — ALBUTEROL SULFATE HFA 108 (90 BASE) MCG/ACT IN AERS: 2.0000 | INHALATION_SPRAY | Freq: Four times a day (QID) | RESPIRATORY_TRACT | Status: DC | PRN

## 2014-08-30 MED ORDER — HYDRALAZINE HCL 20 MG/ML IJ SOLN: 10.0000 mg | Freq: Four times a day (QID) | INTRAMUSCULAR | Status: DC | PRN

## 2014-08-30 MED ORDER — ALBUTEROL SULFATE (2.5 MG/3ML) 0.083% IN NEBU: 2.5000 mg | INHALATION_SOLUTION | Freq: Four times a day (QID) | RESPIRATORY_TRACT | Status: DC | PRN

## 2014-08-30 MED ORDER — INSULIN ASPART 100 UNIT/ML ~~LOC~~ SOLN: 0.0000 [IU] | SUBCUTANEOUS | Status: DC

## 2014-08-30 MED ORDER — INSULIN ASPART 100 UNIT/ML ~~LOC~~ SOLN
0.0000 [IU] | Freq: Three times a day (TID) | SUBCUTANEOUS | Status: DC
  Administered 2014-08-30: 2 [IU] via SUBCUTANEOUS
  Administered 2014-08-31 (×4): 1 [IU] via SUBCUTANEOUS
  Administered 2014-09-01 (×2): 2 [IU] via SUBCUTANEOUS
  Administered 2014-09-01: 1 [IU] via SUBCUTANEOUS
  Administered 2014-09-01 – 2014-09-02 (×2): 2 [IU] via SUBCUTANEOUS
  Administered 2014-09-02 (×2): 1 [IU] via SUBCUTANEOUS
  Administered 2014-09-03 (×4): 2 [IU] via SUBCUTANEOUS
  Administered 2014-09-04: 1 [IU] via SUBCUTANEOUS
  Administered 2014-09-04: 2 [IU] via SUBCUTANEOUS
  Administered 2014-09-05: 1 [IU] via SUBCUTANEOUS
  Administered 2014-09-05: 2 [IU] via SUBCUTANEOUS

## 2014-08-30 MED ORDER — SENNOSIDES-DOCUSATE SODIUM 8.6-50 MG PO TABS
1.0000 | ORAL_TABLET | Freq: Every evening | ORAL | Status: DC | PRN
  Administered 2014-09-01: 1 via ORAL
  Filled 2014-08-30: qty 1

## 2014-08-30 MED ORDER — ASPIRIN 300 MG RE SUPP: 300.0000 mg | Freq: Every day | RECTAL | Status: DC

## 2014-08-30 MED ORDER — SODIUM CHLORIDE 0.9 % IV BOLUS (SEPSIS)
500.0000 mL | Freq: Once | INTRAVENOUS | Status: AC
  Administered 2014-08-30: 500 mL via INTRAVENOUS

## 2014-08-30 MED ORDER — ASPIRIN 325 MG PO TABS
325.0000 mg | ORAL_TABLET | Freq: Every day | ORAL | Status: DC
  Administered 2014-08-30 – 2014-09-05 (×7): 325 mg via ORAL
  Filled 2014-08-30 (×7): qty 1

## 2014-08-30 NOTE — Progress Notes (Addendum)
Pt arrived to 4N17 via stretcher.  VSS.  Patient oriented and settled into her room.  No c/o pain or discomfort.  Telemetry applied.   MD notified of patient's arrival.  Diet order placed.  Will continue to monitor. Cori Razor, RN

## 2014-08-30 NOTE — ED Notes (Addendum)
[name] reports pt was supposed to have CT scan done today to evaluate a "spot" found on recent xray. Called PCP due to pt being "incoherent" or altered since 2/29. Feels as though pt's speech is slowed. PCP sent pt to be eval for UTI. Daughter sts only thing new then was a cough med given by ED the day before. Pt reports some foul smelling urine. Prior Hx of intermittent urinary incontinence. Pt denies dysuria. Sts she has had some "uncomfortableness" in abd that she can't describe, points to RLQ as source of discomfort. Pt able to verbalize month, address, and president on second try, first sts "Tammi Klippel", sts yr is 31.

## 2014-08-30 NOTE — ED Notes (Signed)
Patient transported to CT 

## 2014-08-30 NOTE — ED Notes (Signed)
Nurse off the unit, but will call back for report when she returns to floor.

## 2014-08-30 NOTE — ED Notes (Signed)
Patient transported to X-ray 

## 2014-08-30 NOTE — Telephone Encounter (Signed)
[facility] Patient Name: Kayla [name] DOB: 01/11/1941 Initial Comment Caller states her mother was prescribed some medication from the doctor, and is having some reactions. Blurred vision and head pain. Concerned that her mother might have had a stroke [name], RN, [name] Date/Time (Eastern Time): 08/30/2014 10:30:26 AM Confirm and document reason for call. If symptomatic, describe symptoms. ---MOTHER WAS PRESCRIBED TESSALON PERLES FOR COUGH. SHE TOOK 2 AND THEN DAUGHTER TALKED WITH HER AND THEN THE DAUGHTER FELT SHE WAS A LITTLE INCOHERENT. DAUGHTER STATES SHE TOOK AT LEAST 2-3 MORE. SHE DID TAKE AS DIRECTED PER THE DAUGHTER. SHE STARTED TAKING THESE ON SUNDAY FEB 28TH AND HAS BEEN WORSENING THIS WEEK. SHE IS MOVING AND WALKING, BUT SHE IS NOT MOVING HER LEGS LIKE SHE SHOULD. SHE IS JUST SLIDING HER FEET AROUND INSTEAD OF WALKING. SHE DOES KNOW THAT SHE IS AT HOME. SHE IS CONFUSED ON THE DAYS OF THE WEEK. SHE CAN MAKE A FIST WITH HER HANDS. VISION IS OFF, CONFUSED ABOUT THE DAYS. DAUGHTER STATES THIS IS ODD BEHAVIOR FOR HER MOTHER. Has the patient traveled out of the country within the last 30 days? ---Not Applicable Does the patient require triage? ---Yes Related visit to physician within the last 2 weeks? ---Yes Does the PT have any chronic conditions? (i.e. diabetes, asthma, etc.) ---Yes List chronic conditions. ---DIABETIC, HTN Guidelines Guideline Title Affirmed Question Affirmed Notes Confusion - Delirium Patient sounds very sick or weak to the triager CONFUSED, HALLCUINATIONS, STATING HER URINE HAD SOME ODOR Final Disposition User Go to ED Now (or PCP triage) Mechele Dawley, RN, [name]

## 2014-08-30 NOTE — Consult Note (Signed)
Admission H&P    Chief Complaint: Altered mental status with confusion and left-sided weakness.  HPI: Kayla [name] is an 74 y.o. female history of hypertension, diabetes mellitus, hyperlipidemia, sickle cell trait and recent bronchitis, presenting with mental status changes with confusion as well as in intensity to bump into objects with walking and slight droop of left lower face. Patient was last known well on 08/19/2014. CT scan of her head showed an ill-defined hypodensity in the right parietal and occipital region. MRI showed acute infarction involving right occipital and posterior inferior temporal lobe, as well as acute infarction involving the anterior right frontal lobe. Moderate a showed right P2 segment occlusion, as well as right M1 and proximal M2 high-grade stenosis. NIH stroke score was 7.  LSN: 08/19/2014 tPA Given: No: Beyond time window for treatment consideration MRankin: 2  Past Medical History  Diagnosis Date  . Asthmatic bronchitis   . Hypertension   . Venous insufficiency   . Diabetes mellitus   . Hypercholesteremia   . Diverticulosis of colon   . Constipation   . Prolapse of vaginal vault after hysterectomy   . Proteinuria   . DJD (degenerative joint disease)   . Fibromyalgia   . Somatic dysfunction   . Anxiety   . Personal history of noncompliance with medical treatment, presenting hazards to health   . Sickle-cell trait   . Allergy     Shell fish  . GERD (gastroesophageal reflux disease)     Past Surgical History  Procedure Laterality Date  . Abdominal hysterectomy    . Robotic assisted laparoscopic sacrocolpopexy  09/2009    Dr. Matilde Sprang  . Cataract extraction w/ intraocular lens  implant, bilateral  2012    Family History  Problem Relation Age of Onset  . Prostate cancer Father    Family history also positive for diabetes mellitus and stroke.  Social History:  reports that she has never smoked. She has never used smokeless tobacco. She  reports that she drinks alcohol. She reports that she does not use illicit drugs.  Allergies:  Allergies  Allergen Reactions  . Fish Allergy Hives  . Shellfish Allergy Hives  . Penicillins     REACTION: red rash  . Tomato Rash    Medications Prior to Admission  Medication Sig Dispense Refill  . albuterol (PROVENTIL HFA;VENTOLIN HFA) 108 (90 BASE) MCG/ACT inhaler Inhale 2 puffs into the lungs every 6 (six) hours as needed for wheezing or shortness of breath.    [name] atenolol (TENORMIN) 50 MG tablet Take 1 tablet (50 mg total) by mouth daily. 3 tablet 6  . benzonatate (TESSALON) 100 MG capsule Take 1 capsule (100 mg total) by mouth 3 (three) times daily as needed for cough. 21 capsule 0  . Blood Glucose Monitoring Suppl (ACCU-CHEK AVIVA PLUS) W/DEVICE KIT Test blood sugar once daily 1 kit 0  . Diphenhyd-Hydrocort-Nystatin (FIRST-DUKES MOUTHWASH) SUSP 1 teaspoon by mouth gargle and swallow four times daily as needed 120 mL 1  . glucose blood (ACCU-CHEK AVIVA) test strip Check blood sugar once daily 100 each 4  . metFORMIN (GLUCOPHAGE) 500 MG tablet TAKE 1 TABLET DAILY WITH BREAKFAST 90 tablet 3  . olmesartan-hydrochlorothiazide (BENICAR HCT) 40-25 MG per tablet Take 1 tablet by mouth daily.        ROS: History obtained from patient's daughter and son.  General ROS: negative for - chills, fatigue, fever, night sweats, weight gain or weight loss Psychological ROS: negative for - behavioral disorder, hallucinations, memory difficulties,  mood swings or suicidal ideation Ophthalmic ROS: negative for - blurry vision, double vision, eye pain or loss of vision ENT ROS: negative for - epistaxis, nasal discharge, oral lesions, sore throat, tinnitus or vertigo Allergy and Immunology ROS: negative for - hives or itchy/watery eyes Hematological and Lymphatic ROS: negative for - bleeding problems, bruising or swollen lymph nodes Endocrine ROS: negative for - galactorrhea, hair pattern changes,  polydipsia/polyuria or temperature intolerance Respiratory ROS: Recent bronchitis Cardiovascular ROS: negative for - chest pain, dyspnea on exertion, edema or irregular heartbeat Gastrointestinal ROS: negative for - abdominal pain, diarrhea, hematemesis, nausea/vomiting or stool incontinence Genito-Urinary ROS: negative for - dysuria, hematuria, incontinence or urinary frequency/urgency Musculoskeletal ROS: negative for - joint swelling or muscular weakness Neurological ROS: as noted in HPI Dermatological ROS: negative for rash and skin lesion changes  Physical Examination: Blood pressure 162/78, pulse 74, temperature 98.8 F (37.1 C), temperature source Oral, resp. rate 18, weight 85.276 kg (188 lb), SpO2 98 %.  HEENT-  Normocephalic, no lesions, without obvious abnormality.  Normal external eye and conjunctiva.  Normal TM's bilaterally.  Normal auditory canals and external ears. Normal external nose, mucus membranes and septum.  Normal pharynx. Neck supple with no masses, nodes, nodules or enlargement. Cardiovascular - regular rate and rhythm, S1, S2 normal, no murmur, click, rub or gallop Lungs - chest clear, no wheezing, rales, normal symmetric air entry Abdomen - soft, non-tender; bowel sounds normal; no masses,  no organomegaly Extremities - no joint deformities, effusion, or inflammation, no edema and no skin discoloration  Neurologic Examination: Mental Status: Alert, oriented, thought content appropriate.  Speech fluent without evidence of aphasia. Able to follow commands without difficulty. Cranial Nerves: II-dense left homonymous hemianopsia. III/IV/VI-Pupils were equal and reacted. Extraocular movements were full and conjugate.    V/VII-mild left facial numbness; mild to moderate left lower facial weakness. VIII-normal. X-slight dysarthria and symmetrical palatal movement. XI: trapezius strength/neck flexion strength normal bilaterally XII-midline tongue extension with  normal strength. Motor: Moderate drift of left upper extremity and mild drift of left lower extremity. Sensory: Normal throughout. Deep Tendon Reflexes: 2+ and symmetric. Plantars: Flexor bilaterally Cerebellar: Normal finger-to-nose testing. Carotid auscultation: Normal  Results for orders placed or performed during the hospital encounter of 08/30/14 (from the past 48 hour(s))  CBC     Status: Abnormal   Collection Time: 08/30/14 11:46 AM  Result Value Ref Range   WBC 5.4 4.0 - 10.5 K/uL   RBC 4.90 3.87 - 5.11 MIL/uL   Hemoglobin 14.2 12.0 - 15.0 g/dL   HCT 15.6 41.4 - 72.9 %   MCV 89.6 78.0 - 100.0 fL   MCH 29.0 26.0 - 34.0 pg   MCHC 32.3 30.0 - 36.0 g/dL   RDW 92.9 (H) 94.2 - 56.5 %   Platelets 244 150 - 400 K/uL  Comprehensive metabolic panel     Status: Abnormal   Collection Time: 08/30/14 11:46 AM  Result Value Ref Range   Sodium 139 135 - 145 mmol/L   Potassium 4.0 3.5 - 5.1 mmol/L   Chloride 104 96 - 112 mmol/L   CO2 27 19 - 32 mmol/L   Glucose, Bld 207 (H) 70 - 99 mg/dL   BUN 21 6 - 23 mg/dL   Creatinine, Ser 7.30 0.50 - 1.10 mg/dL   Calcium 8.9 8.4 - 84.3 mg/dL   Total Protein 7.3 6.0 - 8.3 g/dL   Albumin 3.6 3.5 - 5.2 g/dL   AST 22 0 - 37 U/L  ALT 13 0 - 35 U/L   Alkaline Phosphatase 62 39 - 117 U/L   Total Bilirubin 0.4 0.3 - 1.2 mg/dL   GFR calc non Af Amer 49 (L) >90 mL/min   GFR calc Af Amer 57 (L) >90 mL/min    Comment: (NOTE) The eGFR has been calculated using the CKD EPI equation. This calculation has not been validated in all clinical situations. eGFR's persistently <90 mL/min signify possible Chronic Kidney Disease.    Anion gap 8 5 - 15  Urinalysis, Routine w reflex microscopic     Status: Abnormal   Collection Time: 08/30/14  1:18 PM  Result Value Ref Range   Color, Urine YELLOW YELLOW   APPearance CLEAR CLEAR   Specific Gravity, Urine 1.016 1.005 - 1.030   pH 7.0 5.0 - 8.0   Glucose, UA 250 (A) NEGATIVE mg/dL   Hgb urine dipstick NEGATIVE  NEGATIVE   Bilirubin Urine NEGATIVE NEGATIVE   Ketones, ur NEGATIVE NEGATIVE mg/dL   Protein, ur NEGATIVE NEGATIVE mg/dL   Urobilinogen, UA 1.0 0.0 - 1.0 mg/dL   Nitrite NEGATIVE NEGATIVE   Leukocytes, UA NEGATIVE NEGATIVE    Comment: MICROSCOPIC NOT DONE ON URINES WITH NEGATIVE PROTEIN, BLOOD, LEUKOCYTES, NITRITE, OR GLUCOSE <1000 mg/dL.  CBC     Status: None   Collection Time: 08/30/14  5:05 PM  Result Value Ref Range   WBC 5.5 4.0 - 10.5 K/uL   RBC 4.82 3.87 - 5.11 MIL/uL   Hemoglobin 13.7 12.0 - 15.0 g/dL   HCT 42.2 36.0 - 46.0 %   MCV 87.6 78.0 - 100.0 fL   MCH 28.4 26.0 - 34.0 pg   MCHC 32.5 30.0 - 36.0 g/dL   RDW 15.5 11.5 - 15.5 %   Platelets 248 150 - 400 K/uL  Creatinine, serum     Status: Abnormal   Collection Time: 08/30/14  5:05 PM  Result Value Ref Range   Creatinine, Ser 1.22 (H) 0.50 - 1.10 mg/dL   GFR calc non Af Amer 43 (L) >90 mL/min   GFR calc Af Amer 49 (L) >90 mL/min    Comment: (NOTE) The eGFR has been calculated using the CKD EPI equation. This calculation has not been validated in all clinical situations. eGFR's persistently <90 mL/min signify possible Chronic Kidney Disease.   Glucose, capillary     Status: Abnormal   Collection Time: 08/30/14  5:16 PM  Result Value Ref Range   Glucose-Capillary 112 (H) 70 - 99 mg/dL   Dg Chest 2 View  08/30/2014   CLINICAL DATA:  Altered mental status, possible lung nodule  EXAM: CHEST  2 VIEW  COMPARISON:  08/18/2014  FINDINGS: Cardiac shadow is within normal limits. The lungs are well aerated bilaterally. The previously seen nodular density is less well appreciated on the current exam. No new focal infiltrate is seen. No new bony abnormality is noted.  IMPRESSION: No acute abnormality noted. The previously seen nodular density in the left base is not well appreciated on this exam.   Electronically Signed   By: Inez Catalina M.D.   On: 08/30/2014 14:32   Ct Head Wo Contrast  08/30/2014   CLINICAL DATA:  Confusion   EXAM: CT HEAD WITHOUT CONTRAST  TECHNIQUE: Contiguous axial images were obtained from the base of the skull through the vertex without intravenous contrast.  COMPARISON:  CT sinus 01/18/2014  FINDINGS: Ventricle size is normal.  Cerebral volume normal for age  Right frontal hypodensity consistent with a chronic ischemia.  Hypodensity in the right anterior basal ganglia is unchanged from the prior CT and consistent with chronic infarction. Chronic ischemia throughout the cerebral white matter  Ill-defined hypodensity in the right occipital parietal lobe may represent a subacute infarct. There was a smaller area of hypodensity in this area on the prior CT however the current area is larger.  Negative for hemorrhage.  Calvarium intact.  Atherosclerotic vascular calcification.  IMPRESSION: Chronic ischemic changes as above  Ill-defined hypodensity right occipital parietal lobe may represent subacute infarction. MRI recommended for further evaluation.  Negative for hemorrhage.   Electronically Signed   By: Franchot Gallo M.D.   On: 08/30/2014 13:29   Mr Brain Wo Contrast  08/30/2014   CLINICAL DATA:  Confusion. Stroke symptoms. Unsteady. Abnormal speech.  EXAM: MRI HEAD WITHOUT CONTRAST  MRA HEAD WITHOUT CONTRAST  TECHNIQUE: Multiplanar, multiecho pulse sequences of the brain and surrounding structures were obtained without intravenous contrast. Angiographic images of the head were obtained using MRA technique without contrast.  COMPARISON:  CT head without contrast from the same day.  FINDINGS: MRI HEAD FINDINGS  An acute nonhemorrhagic infarct is noted within the posterior right occipital and anterior right temporal lobe. Additional areas of acute nonhemorrhagic infarction are present within the anterior right frontal lobe. These correspond with the areas of hypoattenuation on CT, some of which were felt to be chronic. T2 changes are associated with the areas of acute infarction.  A remote lacunar infarct involves  the right caudate head and lentiform nucleus. Additional smaller remote lacunar infarcts are present within the basal ganglia and corona radiata bilaterally. Extensive periventricular and subcortical white matter changes are noted. White matter disease extends into the brainstem.  Flow is present in the major intracranial arteries. Bilateral lens replacements are noted. A polyp or mucous retention cyst is present in the inferior left maxillary sinus. The remaining paranasal sinuses and the mastoid air cells are clear. The skullbase is unremarkable. Midline structures are within normal limits.  MRA HEAD FINDINGS  Atherosclerotic changes are present within the cavernous internal carotid arteries bilaterally a 50% stenosis is present in the precavernous left internal carotid artery. There is no significant stenosis of greater than 50% in the right internal carotid artery. There is some irregularity within the left M1 segment and bilateral A1 segments. The anterior communicating artery is not definitively seen. There is a high-grade stenosis of the distal right M1 segment and a tandem more distal stenosis in the a proximal right M2 branch. There is moderate attenuation of more much more distal left MCA branches.  The vertebral arteries are small bilaterally. The left PICA origin is visualized and normal. The right PICA is not visualized. The vertebrobasilar junction is within normal limits. There is mild narrowing of the distal basilar artery, less than 50%. A high-grade stenosis is present and and proximal left P1 segment. The right P2 segment is occluded. There is attenuation of distal left PCA branch vessels.  IMPRESSION: 1. Acute nonhemorrhagic infarcts involving the right occipital lobe and posterior inferior right temporal lobe. 2. The right P2 segment occlusion is associated with these infarcts. 3. Acute nonhemorrhagic infarcts involving the anterior right frontal lobe. 4. High-grade tandem stenoses of the distal  right M1 segment and associated proximal right M2 segment. 5. Age advanced atrophy and diffuse white matter disease. 6. This corresponds with otherwise seen moderate distal small vessel disease.   Electronically Signed   By: San Morelle M.D.   On: 08/30/2014 19:55   Mr  Mra Head/brain Wo Cm  08/30/2014   CLINICAL DATA:  Confusion. Stroke symptoms. Unsteady. Abnormal speech.  EXAM: MRI HEAD WITHOUT CONTRAST  MRA HEAD WITHOUT CONTRAST  TECHNIQUE: Multiplanar, multiecho pulse sequences of the brain and surrounding structures were obtained without intravenous contrast. Angiographic images of the head were obtained using MRA technique without contrast.  COMPARISON:  CT head without contrast from the same day.  FINDINGS: MRI HEAD FINDINGS  An acute nonhemorrhagic infarct is noted within the posterior right occipital and anterior right temporal lobe. Additional areas of acute nonhemorrhagic infarction are present within the anterior right frontal lobe. These correspond with the areas of hypoattenuation on CT, some of which were felt to be chronic. T2 changes are associated with the areas of acute infarction.  A remote lacunar infarct involves the right caudate head and lentiform nucleus. Additional smaller remote lacunar infarcts are present within the basal ganglia and corona radiata bilaterally. Extensive periventricular and subcortical white matter changes are noted. White matter disease extends into the brainstem.  Flow is present in the major intracranial arteries. Bilateral lens replacements are noted. A polyp or mucous retention cyst is present in the inferior left maxillary sinus. The remaining paranasal sinuses and the mastoid air cells are clear. The skullbase is unremarkable. Midline structures are within normal limits.  MRA HEAD FINDINGS  Atherosclerotic changes are present within the cavernous internal carotid arteries bilaterally a 50% stenosis is present in the precavernous left internal carotid  artery. There is no significant stenosis of greater than 50% in the right internal carotid artery. There is some irregularity within the left M1 segment and bilateral A1 segments. The anterior communicating artery is not definitively seen. There is a high-grade stenosis of the distal right M1 segment and a tandem more distal stenosis in the a proximal right M2 branch. There is moderate attenuation of more much more distal left MCA branches.  The vertebral arteries are small bilaterally. The left PICA origin is visualized and normal. The right PICA is not visualized. The vertebrobasilar junction is within normal limits. There is mild narrowing of the distal basilar artery, less than 50%. A high-grade stenosis is present and and proximal left P1 segment. The right P2 segment is occluded. There is attenuation of distal left PCA branch vessels.  IMPRESSION: 1. Acute nonhemorrhagic infarcts involving the right occipital lobe and posterior inferior right temporal lobe. 2. The right P2 segment occlusion is associated with these infarcts. 3. Acute nonhemorrhagic infarcts involving the anterior right frontal lobe. 4. High-grade tandem stenoses of the distal right M1 segment and associated proximal right M2 segment. 5. Age advanced atrophy and diffuse white matter disease. 6. This corresponds with otherwise seen moderate distal small vessel disease.   Electronically Signed   By: San Morelle M.D.   On: 08/30/2014 19:55    Assessment: 74 y.o. female with multiple risk factors for stroke presenting with acute right occipital and posterior temporal infarction as well as anterior right frontal infarction. MRI showed right P2 occlusion as well as high-grade stenosis involving right M1 and proximal M2 branch.  Stroke Risk Factors - diabetes mellitus, family history, hyperlipidemia and hypertension  Plan: 1. HgbA1c, fasting lipid panel 2. PT consult, OT consult 3. Echocardiogram 4. Carotid dopplers 5.  Prophylactic therapy-Antiplatelet med: Aspirin  6. Risk factor modification 7. Telemetry monitoring  C.R. Nicole Kindred, MD [facility] [phone]  08/30/2014, 8:57 PM

## 2014-08-30 NOTE — H&P (Signed)
[facility] History and Physical  Kayla [name] ZDG:[id number] DOB: 08/21/1940 DOA: 08/30/2014  Referring physician: Dr. Pamella Pert PCP: Olga Millers, MD  Specialists:   Chief Complaint: Confusion  HPI: Kayla [name] is a 74 y.o. female  With a history of hypertension, diabetes, who presented to the emergency department with complaints of confusion. Patient has a complaint by her family. Patient supposedly had bronchitis a few weeks ago and was given antibiotics as well as Best boy. Per the family, patient began becoming confused after taking Tessalon Perles. Her confusion has worsened over the past 2 weeks. Patient admits to running into things and being unsteady on her feet. Per the daughter, patient has had some periods of confusion and her speech has not made sense.  Patient denies any recent travel or ill contacts. She denies any chest pain or shortness of breath, diarrhea, abdominal pain, urinary symptoms. CT of the head was conducted in the emergency department showing an ill-defined hypodensity right occiptal parietal lobe.  [facility] was asked to admit.   Review of Systems:  Constitutional: Denies fever, chills, diaphoresis, appetite change and fatigue.  HEENT: Denies photophobia, eye pain, redness, hearing loss, ear pain, congestion, sore throat, rhinorrhea, sneezing, mouth sores, trouble swallowing, neck pain, neck stiffness and tinnitus.   Respiratory: Denies SOB, DOE, cough, chest tightness,  and wheezing.   Cardiovascular: Denies chest pain, palpitations and leg swelling.  Gastrointestinal: Denies nausea, vomiting, abdominal pain, diarrhea, constipation, blood in stool and abdominal distention.  Genitourinary: Denies dysuria, urgency, frequency, hematuria, flank pain and difficulty urinating.  Musculoskeletal: Denies myalgias, back pain, joint swelling, arthralgias and gait problem.  Skin: Denies pallor, rash and wound.  Neurological: Has had increased  confusion, unsteady on her feet and running into things.  Hematological: Denies adenopathy. Easy bruising, personal or family bleeding history  Psychiatric/Behavioral: Denies suicidal ideation, mood changes, confusion, nervousness, sleep disturbance and agitation  Past Medical History  Diagnosis Date  . Asthmatic bronchitis   . Hypertension   . Venous insufficiency   . Diabetes mellitus   . Hypercholesteremia   . Diverticulosis of colon   . Constipation   . Prolapse of vaginal vault after hysterectomy   . Proteinuria   . DJD (degenerative joint disease)   . Fibromyalgia   . Somatic dysfunction   . Anxiety   . Personal history of noncompliance with medical treatment, presenting hazards to health   . Sickle-cell trait   . Allergy     Shell fish  . GERD (gastroesophageal reflux disease)    Past Surgical History  Procedure Laterality Date  . Abdominal hysterectomy    . Robotic assisted laparoscopic sacrocolpopexy  09/2009    Dr. Matilde Sprang  . Cataract extraction w/ intraocular lens  implant, bilateral  2012   Social History:  reports that she has never smoked. She has never used smokeless tobacco. She reports that she drinks alcohol. She reports that she does not use illicit drugs.   Allergies  Allergen Reactions  . Fish Allergy Hives  . Shellfish Allergy Hives  . Penicillins     REACTION: red rash  . Tomato Rash    Family History  Problem Relation Age of Onset  . Prostate cancer Father    Prior to Admission medications   Medication Sig Start Date End Date Taking? Authorizing Provider  albuterol (PROVENTIL HFA;VENTOLIN HFA) 108 (90 BASE) MCG/ACT inhaler Inhale 2 puffs into the lungs every 6 (six) hours as needed for wheezing or shortness of breath.  Yes Historical Provider, MD  atenolol (TENORMIN) 50 MG tablet Take 1 tablet (50 mg total) by mouth daily. 01/04/14  Yes Noralee Space, MD  benzonatate (TESSALON) 100 MG capsule Take 1 capsule (100 mg total) by mouth 3  (three) times daily as needed for cough. 08/18/14  Yes Sherwood Gambler, MD  Blood Glucose Monitoring Suppl (ACCU-CHEK AVIVA PLUS) W/DEVICE KIT Test blood sugar once daily 09/13/13  Yes Noralee Space, MD  Diphenhyd-Hydrocort-Nystatin (FIRST-DUKES MOUTHWASH) SUSP 1 teaspoon by mouth gargle and swallow four times daily as needed 08/20/14  Yes Olga Millers, MD  glucose blood (ACCU-CHEK AVIVA) test strip Check blood sugar once daily 01/04/14  Yes Noralee Space, MD  metFORMIN (GLUCOPHAGE) 500 MG tablet TAKE 1 TABLET DAILY WITH BREAKFAST 08/06/14  Yes Olga Millers, MD  olmesartan-hydrochlorothiazide (BENICAR HCT) 40-25 MG per tablet Take 1 tablet by mouth daily.     Yes Historical Provider, MD   Physical Exam: Filed Vitals:   08/30/14 1345  BP: 168/87  Pulse: 71  Temp:   Resp: 20     General: Well developed, well nourished, NAD, appears stated age  HEENT: NCAT, PERRLA, EOMI, Anicteic Sclera, mucous membranes moist.   Neck: Supple, no JVD, no masses  Cardiovascular: S1 S2 auscultated, no rubs, murmurs or gallops. Regular rate and rhythm.  Respiratory: Clear to auscultation bilaterally with equal chest rise  Abdomen: Soft, nontender, nondistended, + bowel sounds  Extremities: warm dry without cyanosis clubbing. Trace LE edema B/L.  Neuro: AAOx3, cranial nerves grossly intact. Strength 3/5 LLE, LUE.  +dysmetria- patient will follow commands-however slowly with repeated direction, slight left facial droop,   Skin: Without rashes exudates or nodules  Psych: Normal affect and demeanor with intact judgement and insight  Labs on Admission:  Basic Metabolic Panel:  Recent Labs Lab 08/30/14 1146  NA 139  K 4.0  CL 104  CO2 27  GLUCOSE 207*  BUN 21  CREATININE 1.08  CALCIUM 8.9   Liver Function Tests:  Recent Labs Lab 08/30/14 1146  AST 22  ALT 13  ALKPHOS 62  BILITOT 0.4  PROT 7.3  ALBUMIN 3.6   No results for input(s): LIPASE, AMYLASE in the last 168 hours. No  results for input(s): AMMONIA in the last 168 hours. CBC:  Recent Labs Lab 08/30/14 1146  WBC 5.4  HGB 14.2  HCT 43.9  MCV 89.6  PLT 244   Cardiac Enzymes: No results for input(s): CKTOTAL, CKMB, CKMBINDEX, TROPONINI in the last 168 hours.  BNP (last 3 results) No results for input(s): BNP in the last 8760 hours.  ProBNP (last 3 results) No results for input(s): PROBNP in the last 8760 hours.  CBG: No results for input(s): GLUCAP in the last 168 hours.  Radiological Exams on Admission: Dg Chest 2 View  08/30/2014   CLINICAL DATA:  Altered mental status, possible lung nodule  EXAM: CHEST  2 VIEW  COMPARISON:  08/18/2014  FINDINGS: Cardiac shadow is within normal limits. The lungs are well aerated bilaterally. The previously seen nodular density is less well appreciated on the current exam. No new focal infiltrate is seen. No new bony abnormality is noted.  IMPRESSION: No acute abnormality noted. The previously seen nodular density in the left base is not well appreciated on this exam.   Electronically Signed   By: Inez Catalina M.D.   On: 08/30/2014 14:32   Ct Head Wo Contrast  08/30/2014   CLINICAL DATA:  Confusion  EXAM: CT HEAD  WITHOUT CONTRAST  TECHNIQUE: Contiguous axial images were obtained from the base of the skull through the vertex without intravenous contrast.  COMPARISON:  CT sinus 01/18/2014  FINDINGS: Ventricle size is normal.  Cerebral volume normal for age  Right frontal hypodensity consistent with a chronic ischemia. Hypodensity in the right anterior basal ganglia is unchanged from the prior CT and consistent with chronic infarction. Chronic ischemia throughout the cerebral white matter  Ill-defined hypodensity in the right occipital parietal lobe may represent a subacute infarct. There was a smaller area of hypodensity in this area on the prior CT however the current area is larger.  Negative for hemorrhage.  Calvarium intact.  Atherosclerotic vascular calcification.   IMPRESSION: Chronic ischemic changes as above  Ill-defined hypodensity right occipital parietal lobe may represent subacute infarction. MRI recommended for further evaluation.  Negative for hemorrhage.   Electronically Signed   By: Franchot Gallo M.D.   On: 08/30/2014 13:29    EKG: Independently reviewed. Sinus rhythm, rate 73  Assessment/Plan  Altered Mental status/TIA versus stroke -Patient will be admitted to telemetry (monitor for arrhythmias) at [place] of the head: Chronic ischemic changes, ill-defined hypodensity right occipital parietal lobe -Given patient's neurological changes and deficits, will workup for stroke -Will obtain MRI of the brain, echocardiogram, carotid Doppler, lipid panel, hemoglobin A1c -Wll consult speech, physical, occupational therapy -Neurology consulted and appreciated -Will place patient on aspirin -Infectious etiology ruled out, patient afebrile with no leukocytosis, chest x-ray and UA negative for infection  Essential hypertension -Hold home medications, Benicar, atenolol  -Allow for permissive hypertension -Will provide hydralazine IV every 6 hours when necessary for systolic blood pressures above 180  Diabetes mellitus, type II -Metformin held -Will obtain hemoglobin A1c -Place patient on insulin sliding scale was CBG monitoring  Hyperlipidemia  -Currently on no home medications  -Will check lipid panel   DVT prophylaxis: Lovenox   Code Status: full  Condition: guarded  Communication: Family at bedside. Admission, patients condition and plan of care including tests being ordered have been discussed with the patient and family who indicate understanding and agree with the plan and Code Status.  Disposition Plan: Admitted to [facility].   Time spent: 60 Minutes  Kayla [name] D.O. [facility] Pager [phone]  If 7PM-7AM, please contact night-coverage [email or web address] Password [facility] 08/30/2014, 3:19 PM

## 2014-08-30 NOTE — ED Provider Notes (Signed)
CSN: [id number]     Arrival date & time 08/30/14  1102 History   First MD Initiated Contact with Patient 08/30/14 1132     Chief Complaint  Patient presents with  . Altered Mental Status     (Consider location/radiation/quality/duration/timing/severity/associated sxs/prior Treatment) Patient is a 74 y.o. female presenting with altered mental status. The history is provided by a caregiver.  Altered Mental Status Presenting symptoms: confusion   Severity:  Mild Most recent episode:  More than 2 days ago Episode history:  Continuous Timing:  Constant Progression:  Unchanged Chronicity:  New Context comment:  Possibly after starting Tessalon Perles over one week ago. Associated symptoms: no abdominal pain, no fever, no headaches, no nausea and no vomiting     Past Medical History  Diagnosis Date  . Asthmatic bronchitis   . Hypertension   . Venous insufficiency   . Diabetes mellitus   . Hypercholesteremia   . Diverticulosis of colon   . Constipation   . Prolapse of vaginal vault after hysterectomy   . Proteinuria   . DJD (degenerative joint disease)   . Fibromyalgia   . Somatic dysfunction   . Anxiety   . Personal history of noncompliance with medical treatment, presenting hazards to health   . Sickle-cell trait   . Allergy     Shell fish  . GERD (gastroesophageal reflux disease)    Past Surgical History  Procedure Laterality Date  . Abdominal hysterectomy    . Robotic assisted laparoscopic sacrocolpopexy  09/2009    Dr. Matilde Sprang  . Cataract extraction w/ intraocular lens  implant, bilateral  2012   Family History  Problem Relation Age of Onset  . Prostate cancer Father    History  Substance Use Topics  . Smoking status: Never Smoker   . Smokeless tobacco: Never Used  . Alcohol Use: Yes     Comment: rarely wine   OB History    No data available     Review of Systems  Constitutional: Negative for fever and fatigue.  HENT: Negative for congestion and  drooling.   Eyes: Negative for pain.  Respiratory: Negative for cough and shortness of breath.   Cardiovascular: Negative for chest pain.  Gastrointestinal: Negative for nausea, vomiting, abdominal pain and diarrhea.  Genitourinary: Negative for dysuria and hematuria.  Musculoskeletal: Negative for back pain, gait problem and neck pain.  Skin: Negative for color change.  Neurological: Negative for dizziness and headaches.       Confusion  Hematological: Negative for adenopathy.  Psychiatric/Behavioral: Positive for confusion. Negative for behavioral problems.  All other systems reviewed and are negative.     Allergies  Fish allergy; Shellfish allergy; and Penicillins  Home Medications   Prior to Admission medications   Medication Sig Start Date End Date Taking? Authorizing Provider  atenolol (TENORMIN) 50 MG tablet Take 1 tablet (50 mg total) by mouth daily. 01/04/14   Noralee Space, MD  benzonatate (TESSALON) 100 MG capsule Take 1 capsule (100 mg total) by mouth 3 (three) times daily as needed for cough. 08/18/14   Sherwood Gambler, MD  Blood Glucose Monitoring Suppl (ACCU-CHEK AVIVA PLUS) W/DEVICE KIT Test blood sugar once daily 09/13/13   Noralee Space, MD  Diphenhyd-Hydrocort-Nystatin (FIRST-DUKES MOUTHWASH) SUSP 1 teaspoon by mouth gargle and swallow four times daily as needed 08/20/14   Olga Millers, MD  glucose blood (ACCU-CHEK AVIVA) test strip Check blood sugar once daily 01/04/14   Noralee Space, MD  metFORMIN (GLUCOPHAGE) 500 MG  tablet TAKE 1 TABLET DAILY WITH BREAKFAST 08/06/14   Olga Millers, MD  olmesartan-hydrochlorothiazide (BENICAR HCT) 40-25 MG per tablet Take 1 tablet by mouth daily.      Historical Provider, MD   BP 142/99 mmHg  Pulse 83  Temp(Src) 98.7 F (37.1 C) (Oral)  Resp 20  Wt 188 lb (85.276 kg)  SpO2 98% Physical Exam  Constitutional: She is oriented to person, place, and time. She appears well-developed and well-nourished.  HENT:  Head:  Normocephalic.  Mouth/Throat: Oropharynx is clear and moist. No oropharyngeal exudate.  Eyes: Conjunctivae and EOM are normal. Pupils are equal, round, and reactive to light.  Neck: Normal range of motion. Neck supple.  Cardiovascular: Normal rate, regular rhythm, normal heart sounds and intact distal pulses.  Exam reveals no gallop and no friction rub.   No murmur heard. Pulmonary/Chest: Effort normal and breath sounds normal. No respiratory distress. She has no wheezes.  Abdominal: Soft. Bowel sounds are normal. There is no tenderness. There is no rebound and no guarding.  Musculoskeletal: Normal range of motion. She exhibits no edema or tenderness.  Neurological: She is alert and oriented to person, place, and time.  alert, oriented x2, doesn't know year speech: normal in context and clarity memory: intact grossly cranial nerves II-XII: mild left sided facial drooping, otherwise intact motor strength: 4/5 in all extremities sensation: intact to light touch diffusely  cerebellar: mild dysmetria noted gait: Mild unsteadiness on her feet.  Skin: Skin is warm and dry.  Psychiatric: She has a normal mood and affect. Her behavior is normal.  Nursing note and vitals reviewed.   ED Course  Procedures (including critical care time) Labs Review Labs Reviewed  CBC - Abnormal; Notable for the following:    RDW 15.6 (*)    All other components within normal limits  COMPREHENSIVE METABOLIC PANEL - Abnormal; Notable for the following:    Glucose, Bld 207 (*)    GFR calc non Af Amer 49 (*)    GFR calc Af Amer 57 (*)    All other components within normal limits  URINALYSIS, ROUTINE W REFLEX MICROSCOPIC - Abnormal; Notable for the following:    Glucose, UA 250 (*)    All other components within normal limits  CREATININE, SERUM - Abnormal; Notable for the following:    Creatinine, Ser 1.22 (*)    GFR calc non Af Amer 43 (*)    GFR calc Af Amer 49 (*)    All other components within normal  limits  LIPID PANEL - Abnormal; Notable for the following:    HDL 34 (*)    LDL Cholesterol 110 (*)    All other components within normal limits  GLUCOSE, CAPILLARY - Abnormal; Notable for the following:    Glucose-Capillary 112 (*)    All other components within normal limits  GLUCOSE, CAPILLARY - Abnormal; Notable for the following:    Glucose-Capillary 186 (*)    All other components within normal limits  GLUCOSE, CAPILLARY - Abnormal; Notable for the following:    Glucose-Capillary 133 (*)    All other components within normal limits  CBC  HEMOGLOBIN A1C    Imaging Review Dg Chest 2 View  08/30/2014   CLINICAL DATA:  Altered mental status, possible lung nodule  EXAM: CHEST  2 VIEW  COMPARISON:  08/18/2014  FINDINGS: Cardiac shadow is within normal limits. The lungs are well aerated bilaterally. The previously seen nodular density is less well appreciated on the current exam. No  new focal infiltrate is seen. No new bony abnormality is noted.  IMPRESSION: No acute abnormality noted. The previously seen nodular density in the left base is not well appreciated on this exam.   Electronically Signed   By: Inez Catalina M.D.   On: 08/30/2014 14:32   Ct Head Wo Contrast  08/30/2014   CLINICAL DATA:  Confusion  EXAM: CT HEAD WITHOUT CONTRAST  TECHNIQUE: Contiguous axial images were obtained from the base of the skull through the vertex without intravenous contrast.  COMPARISON:  CT sinus 01/18/2014  FINDINGS: Ventricle size is normal.  Cerebral volume normal for age  Right frontal hypodensity consistent with a chronic ischemia. Hypodensity in the right anterior basal ganglia is unchanged from the prior CT and consistent with chronic infarction. Chronic ischemia throughout the cerebral white matter  Ill-defined hypodensity in the right occipital parietal lobe may represent a subacute infarct. There was a smaller area of hypodensity in this area on the prior CT however the current area is larger.   Negative for hemorrhage.  Calvarium intact.  Atherosclerotic vascular calcification.  IMPRESSION: Chronic ischemic changes as above  Ill-defined hypodensity right occipital parietal lobe may represent subacute infarction. MRI recommended for further evaluation.  Negative for hemorrhage.   Electronically Signed   By: Franchot Gallo M.D.   On: 08/30/2014 13:29   Mr Brain Wo Contrast  08/30/2014   CLINICAL DATA:  Confusion. Stroke symptoms. Unsteady. Abnormal speech.  EXAM: MRI HEAD WITHOUT CONTRAST  MRA HEAD WITHOUT CONTRAST  TECHNIQUE: Multiplanar, multiecho pulse sequences of the brain and surrounding structures were obtained without intravenous contrast. Angiographic images of the head were obtained using MRA technique without contrast.  COMPARISON:  CT head without contrast from the same day.  FINDINGS: MRI HEAD FINDINGS  An acute nonhemorrhagic infarct is noted within the posterior right occipital and anterior right temporal lobe. Additional areas of acute nonhemorrhagic infarction are present within the anterior right frontal lobe. These correspond with the areas of hypoattenuation on CT, some of which were felt to be chronic. T2 changes are associated with the areas of acute infarction.  A remote lacunar infarct involves the right caudate head and lentiform nucleus. Additional smaller remote lacunar infarcts are present within the basal ganglia and corona radiata bilaterally. Extensive periventricular and subcortical white matter changes are noted. White matter disease extends into the brainstem.  Flow is present in the major intracranial arteries. Bilateral lens replacements are noted. A polyp or mucous retention cyst is present in the inferior left maxillary sinus. The remaining paranasal sinuses and the mastoid air cells are clear. The skullbase is unremarkable. Midline structures are within normal limits.  MRA HEAD FINDINGS  Atherosclerotic changes are present within the cavernous internal carotid  arteries bilaterally a 50% stenosis is present in the precavernous left internal carotid artery. There is no significant stenosis of greater than 50% in the right internal carotid artery. There is some irregularity within the left M1 segment and bilateral A1 segments. The anterior communicating artery is not definitively seen. There is a high-grade stenosis of the distal right M1 segment and a tandem more distal stenosis in the a proximal right M2 branch. There is moderate attenuation of more much more distal left MCA branches.  The vertebral arteries are small bilaterally. The left PICA origin is visualized and normal. The right PICA is not visualized. The vertebrobasilar junction is within normal limits. There is mild narrowing of the distal basilar artery, less than 50%. A high-grade stenosis is  present and and proximal left P1 segment. The right P2 segment is occluded. There is attenuation of distal left PCA branch vessels.  IMPRESSION: 1. Acute nonhemorrhagic infarcts involving the right occipital lobe and posterior inferior right temporal lobe. 2. The right P2 segment occlusion is associated with these infarcts. 3. Acute nonhemorrhagic infarcts involving the anterior right frontal lobe. 4. High-grade tandem stenoses of the distal right M1 segment and associated proximal right M2 segment. 5. Age advanced atrophy and diffuse white matter disease. 6. This corresponds with otherwise seen moderate distal small vessel disease.   Electronically Signed   By: San Morelle M.D.   On: 08/30/2014 19:55   Mr Jodene Nam Head/brain Wo Cm  08/30/2014   CLINICAL DATA:  Confusion. Stroke symptoms. Unsteady. Abnormal speech.  EXAM: MRI HEAD WITHOUT CONTRAST  MRA HEAD WITHOUT CONTRAST  TECHNIQUE: Multiplanar, multiecho pulse sequences of the brain and surrounding structures were obtained without intravenous contrast. Angiographic images of the head were obtained using MRA technique without contrast.  COMPARISON:  CT head  without contrast from the same day.  FINDINGS: MRI HEAD FINDINGS  An acute nonhemorrhagic infarct is noted within the posterior right occipital and anterior right temporal lobe. Additional areas of acute nonhemorrhagic infarction are present within the anterior right frontal lobe. These correspond with the areas of hypoattenuation on CT, some of which were felt to be chronic. T2 changes are associated with the areas of acute infarction.  A remote lacunar infarct involves the right caudate head and lentiform nucleus. Additional smaller remote lacunar infarcts are present within the basal ganglia and corona radiata bilaterally. Extensive periventricular and subcortical white matter changes are noted. White matter disease extends into the brainstem.  Flow is present in the major intracranial arteries. Bilateral lens replacements are noted. A polyp or mucous retention cyst is present in the inferior left maxillary sinus. The remaining paranasal sinuses and the mastoid air cells are clear. The skullbase is unremarkable. Midline structures are within normal limits.  MRA HEAD FINDINGS  Atherosclerotic changes are present within the cavernous internal carotid arteries bilaterally a 50% stenosis is present in the precavernous left internal carotid artery. There is no significant stenosis of greater than 50% in the right internal carotid artery. There is some irregularity within the left M1 segment and bilateral A1 segments. The anterior communicating artery is not definitively seen. There is a high-grade stenosis of the distal right M1 segment and a tandem more distal stenosis in the a proximal right M2 branch. There is moderate attenuation of more much more distal left MCA branches.  The vertebral arteries are small bilaterally. The left PICA origin is visualized and normal. The right PICA is not visualized. The vertebrobasilar junction is within normal limits. There is mild narrowing of the distal basilar artery, less than  50%. A high-grade stenosis is present and and proximal left P1 segment. The right P2 segment is occluded. There is attenuation of distal left PCA branch vessels.  IMPRESSION: 1. Acute nonhemorrhagic infarcts involving the right occipital lobe and posterior inferior right temporal lobe. 2. The right P2 segment occlusion is associated with these infarcts. 3. Acute nonhemorrhagic infarcts involving the anterior right frontal lobe. 4. High-grade tandem stenoses of the distal right M1 segment and associated proximal right M2 segment. 5. Age advanced atrophy and diffuse white matter disease. 6. This corresponds with otherwise seen moderate distal small vessel disease.   Electronically Signed   By: San Morelle M.D.   On: 08/30/2014 19:55  EKG Interpretation   Date/Time:  Friday August 30 2014 12:22:32 EST Ventricular Rate:  73 PR Interval:  192 QRS Duration: 99 QT Interval:  399 QTC Calculation: 440 R Axis:   -26 Text Interpretation:  Sinus rhythm Consider RVH or posterior infarct Left  ventricular hypertrophy Borderline T abnormalities, anterior leads  Confirmed by Izea Livolsi  MD, Shanece Cochrane ([id number]) on 08/30/2014 12:28:02 PM      MDM   Final diagnoses:  Confusion  Stroke    12:03 PM 74 y.o. female w hx of HTN, DM who presents with confusion which the family states started on February 28 after starting Tessalon Perles for a cough. They deny any fevers. She has had ongoing mild confusion about what day it is and other simple things that she should know. She denies any pain. She is afebrile and vital signs are unremarkable here. She is alert and oriented 2. We'll get screening labs and imaging.  Multiple findings on neuro exam likely related to subacute CVA given long period of confusion. Will admit to hospitalist.   Pamella Pert, MD 08/31/14 1108

## 2014-08-31 DIAGNOSIS — F419 Anxiety disorder, unspecified: Secondary | ICD-10-CM | POA: Diagnosis present

## 2014-08-31 DIAGNOSIS — Z833 Family history of diabetes mellitus: Secondary | ICD-10-CM | POA: Diagnosis not present

## 2014-08-31 DIAGNOSIS — I6523 Occlusion and stenosis of bilateral carotid arteries: Secondary | ICD-10-CM | POA: Diagnosis present

## 2014-08-31 DIAGNOSIS — I63431 Cerebral infarction due to embolism of right posterior cerebral artery: Secondary | ICD-10-CM | POA: Diagnosis present

## 2014-08-31 DIAGNOSIS — M797 Fibromyalgia: Secondary | ICD-10-CM | POA: Diagnosis present

## 2014-08-31 DIAGNOSIS — I63411 Cerebral infarction due to embolism of right middle cerebral artery: Secondary | ICD-10-CM | POA: Diagnosis present

## 2014-08-31 DIAGNOSIS — R911 Solitary pulmonary nodule: Secondary | ICD-10-CM | POA: Diagnosis present

## 2014-08-31 DIAGNOSIS — E669 Obesity, unspecified: Secondary | ICD-10-CM | POA: Diagnosis present

## 2014-08-31 DIAGNOSIS — H53462 Homonymous bilateral field defects, left side: Secondary | ICD-10-CM | POA: Diagnosis present

## 2014-08-31 DIAGNOSIS — K5900 Constipation, unspecified: Secondary | ICD-10-CM | POA: Diagnosis present

## 2014-08-31 DIAGNOSIS — I63531 Cerebral infarction due to unspecified occlusion or stenosis of right posterior cerebral artery: Secondary | ICD-10-CM

## 2014-08-31 DIAGNOSIS — I69354 Hemiplegia and hemiparesis following cerebral infarction affecting left non-dominant side: Secondary | ICD-10-CM | POA: Diagnosis not present

## 2014-08-31 DIAGNOSIS — Z91018 Allergy to other foods: Secondary | ICD-10-CM | POA: Diagnosis not present

## 2014-08-31 DIAGNOSIS — Z6830 Body mass index (BMI) 30.0-30.9, adult: Secondary | ICD-10-CM | POA: Diagnosis not present

## 2014-08-31 DIAGNOSIS — N179 Acute kidney failure, unspecified: Secondary | ICD-10-CM | POA: Diagnosis present

## 2014-08-31 DIAGNOSIS — M545 Low back pain: Secondary | ICD-10-CM | POA: Diagnosis not present

## 2014-08-31 DIAGNOSIS — Z823 Family history of stroke: Secondary | ICD-10-CM | POA: Diagnosis not present

## 2014-08-31 DIAGNOSIS — R410 Disorientation, unspecified: Secondary | ICD-10-CM

## 2014-08-31 DIAGNOSIS — I639 Cerebral infarction, unspecified: Secondary | ICD-10-CM | POA: Diagnosis not present

## 2014-08-31 DIAGNOSIS — I454 Nonspecific intraventricular block: Secondary | ICD-10-CM | POA: Diagnosis present

## 2014-08-31 DIAGNOSIS — Z7982 Long term (current) use of aspirin: Secondary | ICD-10-CM | POA: Diagnosis not present

## 2014-08-31 DIAGNOSIS — I10 Essential (primary) hypertension: Secondary | ICD-10-CM | POA: Diagnosis present

## 2014-08-31 DIAGNOSIS — I739 Peripheral vascular disease, unspecified: Secondary | ICD-10-CM | POA: Diagnosis present

## 2014-08-31 DIAGNOSIS — M79609 Pain in unspecified limb: Secondary | ICD-10-CM | POA: Diagnosis not present

## 2014-08-31 DIAGNOSIS — E1142 Type 2 diabetes mellitus with diabetic polyneuropathy: Secondary | ICD-10-CM | POA: Diagnosis present

## 2014-08-31 DIAGNOSIS — Z79899 Other long term (current) drug therapy: Secondary | ICD-10-CM | POA: Diagnosis not present

## 2014-08-31 DIAGNOSIS — R29810 Facial weakness: Secondary | ICD-10-CM | POA: Diagnosis present

## 2014-08-31 DIAGNOSIS — Z7951 Long term (current) use of inhaled steroids: Secondary | ICD-10-CM | POA: Diagnosis not present

## 2014-08-31 DIAGNOSIS — E780 Pure hypercholesterolemia: Secondary | ICD-10-CM | POA: Diagnosis present

## 2014-08-31 DIAGNOSIS — Z880 Allergy status to penicillin: Secondary | ICD-10-CM | POA: Diagnosis not present

## 2014-08-31 DIAGNOSIS — Z91013 Allergy to seafood: Secondary | ICD-10-CM | POA: Diagnosis not present

## 2014-08-31 DIAGNOSIS — D573 Sickle-cell trait: Secondary | ICD-10-CM | POA: Diagnosis present

## 2014-08-31 DIAGNOSIS — R4182 Altered mental status, unspecified: Secondary | ICD-10-CM | POA: Diagnosis present

## 2014-08-31 DIAGNOSIS — E785 Hyperlipidemia, unspecified: Secondary | ICD-10-CM | POA: Diagnosis present

## 2014-08-31 LAB — GLUCOSE, CAPILLARY
GLUCOSE-CAPILLARY: 133 mg/dL — AB (ref 70–99)
GLUCOSE-CAPILLARY: 131 mg/dL — AB (ref 70–99)
Glucose-Capillary: 130 mg/dL — ABNORMAL HIGH (ref 70–99)
Glucose-Capillary: 146 mg/dL — ABNORMAL HIGH (ref 70–99)

## 2014-08-31 LAB — LIPID PANEL
Cholesterol: 169 mg/dL (ref 0–200)
HDL: 34 mg/dL — ABNORMAL LOW (ref >39)
LDL Cholesterol: 110 mg/dL — ABNORMAL HIGH (ref 0–99)
Total CHOL/HDL Ratio: 5 RATIO
Triglycerides: 124 mg/dL (ref <150)
VLDL: 25 mg/dL (ref 0–40)

## 2014-08-31 MED ORDER — ATENOLOL 25 MG PO TABS
50.0000 mg | ORAL_TABLET | Freq: Every day | ORAL | Status: DC
  Administered 2014-08-31 – 2014-09-05 (×6): 50 mg via ORAL
  Filled 2014-08-31 (×6): qty 2

## 2014-08-31 MED ORDER — ATORVASTATIN CALCIUM 10 MG PO TABS
20.0000 mg | ORAL_TABLET | Freq: Every day | ORAL | Status: DC
  Administered 2014-08-31 – 2014-09-04 (×5): 20 mg via ORAL
  Filled 2014-08-31 (×5): qty 2

## 2014-08-31 NOTE — Progress Notes (Signed)
CCMD paged, patient showing V-tach on heart monitor. Patient is sitting up, talking with family. Lead placement checked. Patient is showing rhythm of bundle branch block. MD paged. Will continue to monitor patient closely.

## 2014-08-31 NOTE — Progress Notes (Signed)
UR completed 

## 2014-08-31 NOTE — Progress Notes (Signed)
  Echocardiogram 2D Echocardiogram has been performed.  Kayla [name] M 08/31/2014, 12:47 PM

## 2014-08-31 NOTE — Progress Notes (Signed)
VASCULAR LAB PRELIMINARY  PRELIMINARY  PRELIMINARY  PRELIMINARY  Carotid Dopplers completed.    Preliminary report:  1-39% ICA stenosis.  Vertebral artery flow is antegrade.  Zenas Santa, RVT 08/31/2014, 12:54 PM

## 2014-08-31 NOTE — Evaluation (Addendum)
Occupational Therapy Evaluation Patient Details Name: Kayla [name] MRN: [id number] DOB: [birth year] Today's Date: 08/31/2014    History of Present Illness 74 yo female with onset of  L side weakness was diagnosed with acute infarcts of R occipital and posterior inferior temp lobe and ant R frontal lobe    Clinical Impression   Pt admitted with above. Pt independent with ADLs, PTA. Pt with apparent left field deficit. Feel pt will benefit from acute OT to increase independence with BADLs as well as address vision prior to d/c. Recommending CIR for rehab and feel pt is great candidate.     Follow Up Recommendations  CIR;Supervision/Assistance - 24 hour    Equipment Recommendations  Other (comment) (defer to next venue)    Recommendations for Other Services Rehab consult;Speech consult     Precautions / Restrictions Precautions Precautions: Fall Restrictions Weight Bearing Restrictions: No      Mobility Bed Mobility Overal bed mobility: Needs Assistance Bed Mobility: Supine to Sit;Sit to Supine Rolling: Min assist   Supine to sit: Supervision Sit to supine: Supervision   General bed mobility comments: assist to scoot HOB and cues given for technique to scoot HOB  Transfers Overall transfer level: Needs assistance Equipment used: Rolling walker (2 wheeled);1 person hand held assist Transfers: Sit to/from Stand Sit to Stand: Min guard;Min assist Stand pivot transfers: Min assist       General transfer comment: assist to control descent to bed.     Balance Min A for ambulation with RW.                       ADL Overall ADL's : Needs assistance/impaired Eating/Feeding: Minimal assistance;Sitting   Grooming: Minimal assistance;Sitting   Upper Body Bathing: Minimal assitance;Sitting   Lower Body Bathing: Minimal assistance;Sit to/from stand   Upper Body Dressing : Minimal assistance;Sitting   Lower Body Dressing: Minimal assistance;Sit to/from  stand   Toilet Transfer: Minimal assistance;Ambulation;RW (bed)           Functional mobility during ADLs: Minimal assistance;Rolling walker General ADL Comments: Encouraged pt to be using left hand and educated on activities/exercise she could be doing with left hand. Educated on dressing technique. Suggested family sit on pt's left side to get her to turn her head to left side.  Family member asking about gait belt and OT talking to her about where she could purchase one or could use regular belt. discussed d/c recommendation.     Vision Pt wears reading glasses; reports blurry vision Vision Assessment?: Yes Tracking/Visual Pursuits: Other (comment) (difficult tracking on both sides, but more impaired on left ) Visual Fields: Left visual field deficit and apparent visual attention deficit    Perception     Praxis      Pertinent Vitals/Pain Pain Assessment: No/denies pain     Hand Dominance Right   Extremity/Trunk Assessment Upper Extremity Assessment Upper Extremity Assessment: LUE deficits/detail LUE Deficits / Details: 3-/5 shoulder flexion; weak grip strength LUE Sensation: decreased light touch LUE Coordination: decreased fine motor;decreased gross motor   Lower Extremity Assessment Lower Extremity Assessment: Defer to PT evaluation LLE Deficits / Details: 3+ to 4- LLE strength   Cervical / Trunk Assessment Cervical / Trunk Assessment: Normal   Communication Communication Communication: HOH;Expressive difficulties   Cognition Arousal/Alertness: Awake/alert Behavior During Therapy: WFL for tasks assessed/performed Overall Cognitive Status: Impaired/Different from baseline Area of Impairment: Problem solving           Problem Solving: Slow  processing     General Comments          Shoulder Instructions      Home Living Family/patient expects to be discharged to:: Private residence Living Arrangements: Spouse/significant other Available Help at  Discharge: Family Type of Home: House Home Access: Stairs to enter Technical brewer of Steps: 2 Entrance Stairs-Rails: Right;Left;Can reach both Home Layout: One level     Bathroom Shower/Tub: Teacher, early years/pre: Standard (sink close)     Home Equipment: Shower seat;Toilet riser          Prior Functioning/Environment Level of Independence: Independent with assistive device(s) (walking without cane usually recently)             OT Diagnosis: Other (comment)-Hemiparesis non-dominant side;Disturbance of vision   OT Problem List: Decreased strength;Impaired vision/perception;Pain;Decreased cognition;Decreased knowledge of use of DME or AE;Decreased knowledge of precautions;Impaired sensation;Decreased coordination;Impaired UE functional use;Impaired balance (sitting and/or standing)   OT Treatment/Interventions: Self-care/ADL training;Therapeutic exercise;DME and/or AE instruction;Neuromuscular education;Therapeutic activities;Cognitive remediation/compensation;Visual/perceptual remediation/compensation;Patient/family education;Balance training    OT Goals(Current goals can be found in the care plan section) Acute Rehab OT Goals Patient Stated Goal: get better OT Goal Formulation: With patient Time For Goal Achievement: 09/07/14 Potential to Achieve Goals: Good ADL Goals Pt Will Perform Grooming: with set-up;standing;with supervision (locating items on left side) Pt Will Perform Lower Body Bathing: with set-up;with supervision;sit to/from stand Pt Will Perform Lower Body Dressing: sit to/from stand;with set-up;with supervision Pt Will Transfer to Toilet: ambulating;with min guard assist Pt Will Perform Toileting - Clothing Manipulation and hygiene: with supervision;sit to/from stand Additional ADL Goal #1: Pt will independently perform HEP for LUE to increase strength and coordination.  OT Frequency: Min 2X/week   Barriers to D/C:             Co-evaluation              End of Session Equipment Utilized During Treatment: Gait belt;Rolling walker  Activity Tolerance: Patient tolerated treatment well Patient left: in bed;with bed alarm set;with call bell/phone within reach;with family/visitor present   Time: 4970-2637 OT Time Calculation (min): 17 min Charges:  OT General Charges $OT Visit: 1 Procedure OT Evaluation $Initial OT Evaluation Tier I: 1 Procedure G-Codes: OT G-codes **NOT FOR INPATIENT CLASS** Functional Assessment Tool Used: clinical judgment Functional Limitation: Self care Self Care Current Status (C5885): At least 20 percent but less than 40 percent impaired, limited or restricted Self Care Goal Status (O2774): At least 1 percent but less than 20 percent impaired, limited or restricted  Benito Mccreedy OTR/L [phone] 08/31/2014, 3:50 PM

## 2014-08-31 NOTE — Evaluation (Signed)
Physical Therapy Evaluation Patient Details Name: Kayla [name] MRN: [id number] DOB: July 25, 1940 Today's Date: 08/31/2014   History of Present Illness  74 yo female with onset of  L side weakness was diagnosed with acute infarcts of R occipital and posterior inferior temp lobe and ant R frontal lobe   Clinical Impression  Pt was seen for assessment of her functional status and is much less safe and independent with gait than prior to her admission.  Her agreed plan is to request admission to CIR, with daughter and family in agreement to try for this.  Daughter shared MD had stated the same thing.      Follow Up Recommendations CIR;Supervision/Assistance - 24 hour    Equipment Recommendations  Rolling walker with 5" wheels (if pt does not have one)    Recommendations for Other Services Rehab consult     Precautions / Restrictions Precautions Precautions: Fall (telemetry) Restrictions Weight Bearing Restrictions: No      Mobility  Bed Mobility Overal bed mobility: Needs Assistance Bed Mobility: Rolling;Supine to Sit Rolling: Min assist   Supine to sit: Mod assist     General bed mobility comments: reminders for use of bedrail and to attend to LUE  Transfers Overall transfer level: Needs assistance Equipment used: Rolling walker (2 wheeled);1 person hand held assist Transfers: Sit to/from [facility] Sit to Stand: Min assist Stand pivot transfers: Min assist       General transfer comment: Pt was able to stand with reminders for hand placement and to cue her posture and transition  Ambulation/Gait Ambulation/Gait assistance: Min assist Ambulation Distance (Feet): 8 Feet Assistive device: Rolling walker (2 wheeled);1 person hand held assist Gait Pattern/deviations: Step-through pattern;Decreased dorsiflexion - right;Decreased dorsiflexion - left;Shuffle;Wide base of support (8' x 2) Gait velocity: reduced Gait velocity interpretation: Below normal  speed for age/gender    Stairs            Wheelchair Mobility    Modified Rankin (Stroke Patients Only) Modified Rankin (Stroke Patients Only) Pre-Morbid Rankin Score: Slight disability Modified Rankin: Moderately severe disability     Balance Overall balance assessment: Needs assistance Sitting-balance support: Feet supported Sitting balance-Leahy Scale: Fair   Postural control: Posterior lean Standing balance support: Bilateral upper extremity supported Standing balance-Leahy Scale: Poor Standing balance comment: LUE has been inconsistent on RW but with attending to the arm is more stable                             Pertinent Vitals/Pain Pain Assessment: No/denies pain    Home Living Family/patient expects to be discharged to:: Private residence Living Arrangements: Spouse/significant other Available Help at Discharge: Family Type of Home: House Home Access: Stairs to enter Entrance Stairs-Rails: Right;Left;Can reach both Entrance Stairs-Number of Steps: 2 Home Layout: One level Home Equipment: Environmental consultant - 2 wheels;Cane - single point;Shower seat (not sure about RW but thinks she has one)      Prior Function Level of Independence: Independent with assistive device(s) (walking without the cane usually recently)               Hand Dominance        Extremity/Trunk Assessment   Upper Extremity Assessment: LUE deficits/detail       LUE Deficits / Details: dense weakness and limited awareness during use of walker   Lower Extremity Assessment: LLE deficits/detail   LLE Deficits / Details: 3+ to 4- LLE strength  Cervical /  Trunk Assessment: Normal  Communication   Communication: No difficulties  Cognition Arousal/Alertness: Awake/alert Behavior During Therapy: WFL for tasks assessed/performed Overall Cognitive Status: Within Functional Limits for tasks assessed       Memory: Decreased recall of precautions              General  Comments General comments (skin integrity, edema, etc.): Pt is a good candidate for CIR as she is much more debilitated than prior to her admission, and has some real potential for injury if she were to fall.    Exercises Other Exercises Other Exercises: Strength was 4+ to 5RLE and L hip 3+, knee 4- quads, hams 3+ and DF 3+      Assessment/Plan    PT Assessment Patient needs continued PT services  PT Diagnosis Abnormality of gait;Hemiplegia non-dominant side   PT Problem List Decreased strength;Decreased range of motion;Decreased activity tolerance;Decreased balance;Decreased mobility;Decreased coordination;Decreased knowledge of use of DME;Decreased safety awareness;Decreased knowledge of precautions;Cardiopulmonary status limiting activity;Obesity  PT Treatment Interventions DME instruction;Gait training;Stair training;Functional mobility training;Therapeutic activities;Therapeutic exercise;Balance training;Neuromuscular re-education;Patient/family education   PT Goals (Current goals can be found in the Care Plan section) Acute Rehab PT Goals Patient Stated Goal: to be able to walk PT Goal Formulation: With patient/family Time For Goal Achievement: 09/14/14 Potential to Achieve Goals: Good    Frequency Min 4X/week   Barriers to discharge Inaccessible home environment stairs to enter house    Co-evaluation               End of Session Equipment Utilized During Treatment: Gait belt Activity Tolerance: Patient tolerated treatment well;Patient limited by fatigue Patient left: in chair;with call bell/phone within reach;with chair alarm set;with family/visitor present Nurse Communication: Mobility status         Time: 1132-1201 PT Time Calculation (min) (ACUTE ONLY): 29 min   Charges:   PT Evaluation $Initial PT Evaluation Tier I: 1 Procedure PT Treatments $Gait Training: 8-22 mins   PT G CodesRamond Dial 2014-09-03, 1:09 PM  Mee Hives, PT MS Acute  Rehab Dept. Number: [phone]

## 2014-08-31 NOTE — Progress Notes (Signed)
Patient's family requested informations regarding patient's new medications. Medication education sheets given regarding aspirin and lovenox (currently only new medications for patient). RN discussed new medications with family, including purpose, possible side effects.

## 2014-08-31 NOTE — Progress Notes (Signed)
[facility] PROGRESS NOTE  Kayla [name] QPY:[id number] DOB: 05-06-1941 DOA: 08/30/2014 PCP: Olga Millers, MD  Assessment/Plan: 1-multiple acute/subacute ischemic stroke: patient presented with left side weakness and confusion -MRI/MRA with acute infarcts involving the right frontal lobe in a watershed distribution, right medial temporal lobe and right occipital lobe. The combination of findings is highly suspicious for cardioembolic phenomenon. -carotid duplex: bilateral ica stenosis 1-39%; vertebral flow antegrade  -2-D echo: pending -per neurology will need: TEE and loop recorder -continue ASA for secondary prevention -patient with LDL 110 (goal is < 70): will add lipitor -PT/OT: recommending CIR; will follow  2-essential HTN: fair control -will be permissive in setting of acute stroke -hydralazine PRN ordered  3-AKI: holding diuretics and olmesartan -gentle hydration given -will follow BMET in am -no signs of UTI in UA  4-diabetes mellitus: continue SSI -A1C pending  5-HLD: continue statins as mentioned above  6-residual left sided weakness: patient to be seen by CIR as recommended by PT -will monitor  DVT: lovenox  Code Status: Full Family Communication: daughter at bedside Disposition Plan: to be determine; PT recommending CIR   Consultants:  Neurology   Cardiology for TEE/loop recorder  Procedures:  2-D echo pending  Carotid duplex: 1-39% ICA stenosis bilaterally, vertebral flow antegrade  Antibiotics:  None   HPI/Subjective: Afebrile, no CP or SOB. Left side residual weakness appreciated on exam. Mentation is back to normal  Objective: Filed Vitals:   08/31/14 0853  BP: 135/68  Pulse: 79  Temp: 98.8 F (37.1 C)  Resp: 19   No intake or output data in the 24 hours ending 08/31/14 1414 Filed Weights   08/30/14 1113  Weight: 85.276 kg (188 lb)    Exam:   General:  AAOX3, complaining of left side weakness; no fever. No CP or  SOB  Cardiovascular: S1 and S2, no rubs or gallops; positive SEM  Respiratory: CTA bilaterally  Abdomen: soft, NT, ND, positive BS  Musculoskeletal: no edema or cyanosis   Neuro: left side weakness with MS 3/5, patient also reported paresthesia; normal speech and she is AAOX3   Data Reviewed: Basic Metabolic Panel:  Recent Labs Lab 08/30/14 1146 08/30/14 1705  NA 139  --   K 4.0  --   CL 104  --   CO2 27  --   GLUCOSE 207*  --   BUN 21  --   CREATININE 1.08 1.22*  CALCIUM 8.9  --    Liver Function Tests:  Recent Labs Lab 08/30/14 1146  AST 22  ALT 13  ALKPHOS 62  BILITOT 0.4  PROT 7.3  ALBUMIN 3.6   CBC:  Recent Labs Lab 08/30/14 1146 08/30/14 1705  WBC 5.4 5.5  HGB 14.2 13.7  HCT 43.9 42.2  MCV 89.6 87.6  PLT 244 248   CBG:  Recent Labs Lab 08/30/14 1716 08/30/14 2043 08/31/14 0649 08/31/14 1139  GLUCAP 112* 186* 133* 130*   Studies: Dg Chest 2 View  08/30/2014   CLINICAL DATA:  Altered mental status, possible lung nodule  EXAM: CHEST  2 VIEW  COMPARISON:  08/18/2014  FINDINGS: Cardiac shadow is within normal limits. The lungs are well aerated bilaterally. The previously seen nodular density is less well appreciated on the current exam. No new focal infiltrate is seen. No new bony abnormality is noted.  IMPRESSION: No acute abnormality noted. The previously seen nodular density in the left base is not well appreciated on this exam.   Electronically Signed   By:  Inez Catalina M.D.   On: 08/30/2014 14:32   Ct Head Wo Contrast  08/30/2014   CLINICAL DATA:  Confusion  EXAM: CT HEAD WITHOUT CONTRAST  TECHNIQUE: Contiguous axial images were obtained from the base of the skull through the vertex without intravenous contrast.  COMPARISON:  CT sinus 01/18/2014  FINDINGS: Ventricle size is normal.  Cerebral volume normal for age  Right frontal hypodensity consistent with a chronic ischemia. Hypodensity in the right anterior basal ganglia is unchanged from the  prior CT and consistent with chronic infarction. Chronic ischemia throughout the cerebral white matter  Ill-defined hypodensity in the right occipital parietal lobe may represent a subacute infarct. There was a smaller area of hypodensity in this area on the prior CT however the current area is larger.  Negative for hemorrhage.  Calvarium intact.  Atherosclerotic vascular calcification.  IMPRESSION: Chronic ischemic changes as above  Ill-defined hypodensity right occipital parietal lobe may represent subacute infarction. MRI recommended for further evaluation.  Negative for hemorrhage.   Electronically Signed   By: Franchot Gallo M.D.   On: 08/30/2014 13:29   Mr Brain Wo Contrast  08/30/2014   CLINICAL DATA:  Confusion. Stroke symptoms. Unsteady. Abnormal speech.  EXAM: MRI HEAD WITHOUT CONTRAST  MRA HEAD WITHOUT CONTRAST  TECHNIQUE: Multiplanar, multiecho pulse sequences of the brain and surrounding structures were obtained without intravenous contrast. Angiographic images of the head were obtained using MRA technique without contrast.  COMPARISON:  CT head without contrast from the same day.  FINDINGS: MRI HEAD FINDINGS  An acute nonhemorrhagic infarct is noted within the posterior right occipital and anterior right temporal lobe. Additional areas of acute nonhemorrhagic infarction are present within the anterior right frontal lobe. These correspond with the areas of hypoattenuation on CT, some of which were felt to be chronic. T2 changes are associated with the areas of acute infarction.  A remote lacunar infarct involves the right caudate head and lentiform nucleus. Additional smaller remote lacunar infarcts are present within the basal ganglia and corona radiata bilaterally. Extensive periventricular and subcortical white matter changes are noted. White matter disease extends into the brainstem.  Flow is present in the major intracranial arteries. Bilateral lens replacements are noted. A polyp or mucous  retention cyst is present in the inferior left maxillary sinus. The remaining paranasal sinuses and the mastoid air cells are clear. The skullbase is unremarkable. Midline structures are within normal limits.  MRA HEAD FINDINGS  Atherosclerotic changes are present within the cavernous internal carotid arteries bilaterally a 50% stenosis is present in the precavernous left internal carotid artery. There is no significant stenosis of greater than 50% in the right internal carotid artery. There is some irregularity within the left M1 segment and bilateral A1 segments. The anterior communicating artery is not definitively seen. There is a high-grade stenosis of the distal right M1 segment and a tandem more distal stenosis in the a proximal right M2 branch. There is moderate attenuation of more much more distal left MCA branches.  The vertebral arteries are small bilaterally. The left PICA origin is visualized and normal. The right PICA is not visualized. The vertebrobasilar junction is within normal limits. There is mild narrowing of the distal basilar artery, less than 50%. A high-grade stenosis is present and and proximal left P1 segment. The right P2 segment is occluded. There is attenuation of distal left PCA branch vessels.  IMPRESSION: 1. Acute nonhemorrhagic infarcts involving the right occipital lobe and posterior inferior right temporal lobe. 2.  The right P2 segment occlusion is associated with these infarcts. 3. Acute nonhemorrhagic infarcts involving the anterior right frontal lobe. 4. High-grade tandem stenoses of the distal right M1 segment and associated proximal right M2 segment. 5. Age advanced atrophy and diffuse white matter disease. 6. This corresponds with otherwise seen moderate distal small vessel disease.   Electronically Signed   By: San Morelle M.D.   On: 08/30/2014 19:55   Mr Jodene Nam Head/brain Wo Cm  08/30/2014   CLINICAL DATA:  Confusion. Stroke symptoms. Unsteady. Abnormal speech.   EXAM: MRI HEAD WITHOUT CONTRAST  MRA HEAD WITHOUT CONTRAST  TECHNIQUE: Multiplanar, multiecho pulse sequences of the brain and surrounding structures were obtained without intravenous contrast. Angiographic images of the head were obtained using MRA technique without contrast.  COMPARISON:  CT head without contrast from the same day.  FINDINGS: MRI HEAD FINDINGS  An acute nonhemorrhagic infarct is noted within the posterior right occipital and anterior right temporal lobe. Additional areas of acute nonhemorrhagic infarction are present within the anterior right frontal lobe. These correspond with the areas of hypoattenuation on CT, some of which were felt to be chronic. T2 changes are associated with the areas of acute infarction.  A remote lacunar infarct involves the right caudate head and lentiform nucleus. Additional smaller remote lacunar infarcts are present within the basal ganglia and corona radiata bilaterally. Extensive periventricular and subcortical white matter changes are noted. White matter disease extends into the brainstem.  Flow is present in the major intracranial arteries. Bilateral lens replacements are noted. A polyp or mucous retention cyst is present in the inferior left maxillary sinus. The remaining paranasal sinuses and the mastoid air cells are clear. The skullbase is unremarkable. Midline structures are within normal limits.  MRA HEAD FINDINGS  Atherosclerotic changes are present within the cavernous internal carotid arteries bilaterally a 50% stenosis is present in the precavernous left internal carotid artery. There is no significant stenosis of greater than 50% in the right internal carotid artery. There is some irregularity within the left M1 segment and bilateral A1 segments. The anterior communicating artery is not definitively seen. There is a high-grade stenosis of the distal right M1 segment and a tandem more distal stenosis in the a proximal right M2 branch. There is moderate  attenuation of more much more distal left MCA branches.  The vertebral arteries are small bilaterally. The left PICA origin is visualized and normal. The right PICA is not visualized. The vertebrobasilar junction is within normal limits. There is mild narrowing of the distal basilar artery, less than 50%. A high-grade stenosis is present and and proximal left P1 segment. The right P2 segment is occluded. There is attenuation of distal left PCA branch vessels.  IMPRESSION: 1. Acute nonhemorrhagic infarcts involving the right occipital lobe and posterior inferior right temporal lobe. 2. The right P2 segment occlusion is associated with these infarcts. 3. Acute nonhemorrhagic infarcts involving the anterior right frontal lobe. 4. High-grade tandem stenoses of the distal right M1 segment and associated proximal right M2 segment. 5. Age advanced atrophy and diffuse white matter disease. 6. This corresponds with otherwise seen moderate distal small vessel disease.   Electronically Signed   By: San Morelle M.D.   On: 08/30/2014 19:55    Scheduled Meds: . aspirin  300 mg Rectal Daily   Or  . aspirin  325 mg Oral Daily  . atorvastatin  20 mg Oral q1800  . enoxaparin (LOVENOX) injection  40 mg Subcutaneous Q24H  .  insulin aspart  0-9 Units Subcutaneous TID AC & HS   Continuous Infusions:   Principal Problem:   Altered mental status Active Problems:   HYPERCHOLESTEROLEMIA, MILD   Essential hypertension   Type 2 diabetes mellitus with diabetic neuropathy   Acute ischemic right MCA stroke   Acute right PCA stroke    Time spent: 30 minutes   Barton Dubois  [facility] Pager [phone]. If 7PM-7AM, please contact night-coverage at [email or web address], password [facility] 08/31/2014, 2:14 PM

## 2014-08-31 NOTE — Progress Notes (Signed)
SLP Cancellation Note  Patient Details Name: CLEMIE GENERAL MRN: [id number] DOB: 1940/10/09   Cancelled treatment:       Reason Eval/Treat Not Completed: Patient at procedure or test/unavailable- ultrasound.   Kern Reap, MA, CCC-SLP 08/31/2014, 12:18 PM

## 2014-08-31 NOTE — Progress Notes (Signed)
    [facility] has been requested to perform a transesophageal echocardiogram and loop recorder implant on Kayla [name] for stroke.  After careful review of history and examination, the risks and benefits of transesophageal echocardiogram have been explained to the pt and family including risks of esophageal damage, perforation (1:10,000 risk), bleeding, pharyngeal hematoma as well as other potential complications associated with conscious sedation including aspiration, arrhythmia, respiratory failure and death. Alternatives to treatment were discussed, questions were answered. Patient is willing to proceed.   Erlene Quan, PA 08/31/2014 2:46 PM

## 2014-08-31 NOTE — Progress Notes (Signed)
STROKE TEAM PROGRESS NOTE   HISTORY Kayla [name] is an 74 y.o. female history of hypertension, diabetes mellitus, hyperlipidemia, sickle cell trait and recent bronchitis, presenting with mental status changes with confusion as well as in intensity to bump into objects with walking and slight droop of left lower face. Patient was last known well on 08/19/2014. CT scan of her head showed an ill-defined hypodensity in the right parietal and occipital region. MRI showed acute infarction involving right occipital and posterior inferior temporal lobe, as well as acute infarction involving the anterior right frontal lobe. Moderate a showed right P2 segment occlusion, as well as right M1 and proximal M2 high-grade stenosis. NIH stroke score was 7.  LSN: 08/19/2014 tPA Given: No: Beyond time window for treatment consideration MRankin: 2   SUBJECTIVE (INTERVAL HISTORY) She reports having weakness on the left side. This is about the same. The family is at the bedside.   OBJECTIVE Temp:  [98 F (36.7 C)-98.8 F (37.1 C)] 98.8 F (37.1 C) (03/12 0853) Pulse Rate:  [64-79] 79 (03/12 0853) Cardiac Rhythm:  [-] Bundle branch block (03/12 0830) Resp:  [18-27] 19 (03/12 0853) BP: (122-168)/(68-102) 135/68 mmHg (03/12 0853) SpO2:  [96 %-99 %] 96 % (03/12 0853)   Recent Labs Lab 08/30/14 1716 08/30/14 2043 08/31/14 0649  GLUCAP 112* 186* 133*    Recent Labs Lab 08/30/14 1146 08/30/14 1705  NA 139  --   K 4.0  --   CL 104  --   CO2 27  --   GLUCOSE 207*  --   BUN 21  --   CREATININE 1.08 1.22*  CALCIUM 8.9  --     Recent Labs Lab 08/30/14 1146  AST 22  ALT 13  ALKPHOS 62  BILITOT 0.4  PROT 7.3  ALBUMIN 3.6    Recent Labs Lab 08/30/14 1146 08/30/14 1705  WBC 5.4 5.5  HGB 14.2 13.7  HCT 43.9 42.2  MCV 89.6 87.6  PLT 244 248   No results for input(s): CKTOTAL, CKMB, CKMBINDEX, TROPONINI in the last 168 hours. No results for input(s): LABPROT, INR in the last 72  hours.  Recent Labs  08/30/14 1318  COLORURINE YELLOW  LABSPEC 1.016  PHURINE 7.0  GLUCOSEU 250*  HGBUR NEGATIVE  BILIRUBINUR NEGATIVE  KETONESUR NEGATIVE  PROTEINUR NEGATIVE  UROBILINOGEN 1.0  NITRITE NEGATIVE  LEUKOCYTESUR NEGATIVE       Component Value Date/Time   CHOL 169 08/31/2014 0605   TRIG 124 08/31/2014 0605   HDL 34* 08/31/2014 0605   CHOLHDL 5.0 08/31/2014 0605   VLDL 25 08/31/2014 0605   LDLCALC 110* 08/31/2014 0605   Lab Results  Component Value Date   HGBA1C 7.5* 05/14/2014   No results found for: LABOPIA, COCAINSCRNUR, LABBENZ, AMPHETMU, THCU, LABBARB  No results for input(s): ETH in the last 168 hours.    Dg Chest 2 View 08/18/2014 1. No evidence of acute cardiopulmonary process. 2. Interval development of a nodular opacity in the lateral aspect of the left lung base on the frontal view. This finding is concerning for a pulmonary nodule and primary bronchogenic carcinoma. Recommend further evaluation with nonemergent CT scan of the chest. 3. Background bronchitic change and mild interstitial prominence suggests underlying COPD.   Dg Chest 2 View 08/30/2014    No acute abnormality noted. The previously seen nodular density in the left base is not well appreciated on this exam.       CT of the chest with contrast was ordered  for 09/06/2014     Ct Head Wo Contrast 08/30/2014    Chronic ischemic changes as above  Ill-defined hypodensity right occipital parietal lobe may represent subacute infarction. MRI recommended for further evaluation.  Negative for hemorrhage.       MRI / MRA Brain Wo Contrast 08/30/2014    1. Acute nonhemorrhagic infarcts involving the right occipital lobe and posterior inferior right temporal lobe.  2. The right P2 segment occlusion is associated with these infarcts.  3. Acute nonhemorrhagic infarcts involving the anterior right frontal lobe.  4. High-grade tandem stenoses of the distal right M1 segment and associated  proximal right M2 segment.  5. Age advanced atrophy and diffuse white matter disease.  6. This corresponds with otherwise seen moderate distal small vessel disease.        PHYSICAL EXAM  GENERAL: This is a very pleasant female in no acute distress.  HEENT: Unremarkable.  ABDOMEN: soft  EXTREMITIES: No edema   BACK:Unremarkable.  SKIN: Normal by inspection.    MENTAL STATUS: Alert and oriented. Speech, language and cognition are generally intact. Judgment and insight fair. There appears to be some left-sided neglect.   CRANIAL NERVES: Pupils are equal, round and reactive to light and accomodation; extra ocular movements are full, there is no significant nystagmus; Visual field shows a dense left homonymous hemianopia; upper and lower facial muscles are normal in strength and symmetric, there is no flattening of the nasolabial folds; tongue is midline; uvula is midline; shoulder elevation is normal.  MOTOR: Right upper extremity shows normal tone, bulk and strength. Right leg slight weakness 4/5 hip flexion. Left upper extremity 3/5 and left lower extremity 3/5.  COORDINATION: Left finger to nose is normal, right finger to nose is normal, No rest tremor; no intention tremor; no postural tremor; no bradykinesia.   Brain MRI and MRA are reviewed and person. The patient has acute infarcts involving the right frontal lobe in a watershed distribution, right medial temporal lobe and right occipital lobe. The combination of findings is highly suspicious for cardioembolic phenomenon. There is moderate deep white matter and periventricular leukoencephalopathy. There are white matter lesions also seen in the pontine region.     ASSESSMENT/PLAN Kayla [name] is a 74 y.o. female with history of hypertension, diabetes mellitus, hyperlipidemia, sickle cell trait, presenting with altered mental status, left lower facial droop, and a disturbance. She did not receive IV t-PA due to late  presentation.   Stroke:  Multiple acute infarct involving the Right frontal lobe,Right temporal lobe and right occipital lobe. The findings are worrisome and suspicious for cardioembolic strokes.  Resultant  Left Homonymous hemianopia and left hemiparesis.  MRI  Acute nonhemorrhagic infarcts involving the right occipital lobe, the posterior inferior right temporal lobe, and the anterior right frontal lobe.  MRA - right P2 segment occlusion is associated with these infarcts. High-grade tandem stenoses of the distal right M1 segment and associated proximal right M2 segment.   Carotid Doppler - pending  2D Echo  pending  LDL 110  HgbA1c pending  Lovenox for VTE prophylaxis  Diet heart healthy/carb modified with thin liquids.  no antithrombotic prior to admission, now on aspirin 325 mg orally every day  Ongoing aggressive stroke risk factor management  Therapy recommendations: Pending  Disposition: Pending  We will need to have TEE and the loop recorder given Likely cardioembolic Source.  Hypertension  Home meds:  Atenolol 50 mg daily and Benicar with hydrochlorothiazide  Stable - currently off antihypertensive medications.  Hyperlipidemia  Home meds:  No lipid lowering medications prior to admission.  LDL 110 goal <70  Add Lipitor 20 mg daily  Continue statin at discharge  Diabetes  HgbA1c pending -goal < 7.0  Controlled  Other Stroke Risk Factors  Advanced age  ETOH use  Obesity, Body mass index is 30.36 kg/(m^2).    Other Active Problems    Other Pertinent History  Shellfish allergy  History of abnormal chest x-ray with follow-up CT of the chest scheduled by primary care physician for 09/06/2014.  Hospital day #   Mikey Bussing PA-C [facility] Pager [phone] 08/31/2014, 11:29 AM  The patient was seen and examined by me; notes, chart and tests reviewed and discussed with midlevel provider, other providers, patient,  and family.      To contact Stroke Continuity provider, please refer to [email or web address] After hours, contact General Neurology

## 2014-09-01 ENCOUNTER — Inpatient Hospital Stay (HOSPITAL_COMMUNITY): Payer: Medicare HMO

## 2014-09-01 LAB — BASIC METABOLIC PANEL
ANION GAP: 9 (ref 5–15)
BUN: 19 mg/dL (ref 6–23)
CALCIUM: 9.1 mg/dL (ref 8.4–10.5)
CO2: 25 mmol/L (ref 19–32)
Chloride: 104 mmol/L (ref 96–112)
Creatinine, Ser: 1.09 mg/dL (ref 0.50–1.10)
GFR, EST AFRICAN AMERICAN: 57 mL/min — AB (ref >90)
GFR, EST NON AFRICAN AMERICAN: 49 mL/min — AB (ref >90)
GLUCOSE: 154 mg/dL — AB (ref 70–99)
Potassium: 3.5 mmol/L (ref 3.5–5.1)
SODIUM: 138 mmol/L (ref 135–145)

## 2014-09-01 LAB — GLUCOSE, CAPILLARY
Glucose-Capillary: 156 mg/dL — ABNORMAL HIGH (ref 70–99)
Glucose-Capillary: 167 mg/dL — ABNORMAL HIGH (ref 70–99)
Glucose-Capillary: 124 mg/dL — ABNORMAL HIGH (ref 70–99)

## 2014-09-01 LAB — MAGNESIUM: Magnesium: 2 mg/dL (ref 1.5–2.5)

## 2014-09-01 NOTE — Progress Notes (Signed)
STROKE TEAM PROGRESS NOTE   HISTORY Kayla [name] is an 74 y.o. female history of hypertension, diabetes mellitus, hyperlipidemia, sickle cell trait and recent bronchitis, presenting with mental status changes with confusion as well as in intensity to bump into objects with walking and slight droop of left lower face. Patient was last known well on 08/19/2014. CT scan of her head showed an ill-defined hypodensity in the right parietal and occipital region. MRI showed acute infarction involving right occipital and posterior inferior temporal lobe, as well as acute infarction involving the anterior right frontal lobe. Moderate a showed right P2 segment occlusion, as well as right M1 and proximal M2 high-grade stenosis. NIH stroke score was 7.  LSN: 08/19/2014 tPA Given: No: Beyond time window for treatment consideration MRankin: 2   SUBJECTIVE (INTERVAL HISTORY) She reports having weakness on the left side. This is about the same. The family is at the bedside. Nurse's staff was concerned about headaches reported earlier by the patient along with increased in NIH stroke scale. Repeat head CT scan done but unchanged from previous.   OBJECTIVE Temp:  [97.9 F (36.6 C)-98.8 F (37.1 C)] 98.3 F (36.8 C) (03/13 0645) Pulse Rate:  [66-79] 66 (03/13 0645) Cardiac Rhythm:  [-] Bundle branch block (03/13 0445) Resp:  [18-19] 18 (03/13 0645) BP: (111-146)/(58-81) 111/67 mmHg (03/13 0645) SpO2:  [94 %-99 %] 94 % (03/13 0645)   Recent Labs Lab 08/31/14 0649 08/31/14 1139 08/31/14 1655 08/31/14 2219 09/01/14 0645  GLUCAP 133* 130* 131* 146* 156*    Recent Labs Lab 08/30/14 1146 08/30/14 1705 09/01/14 0615  NA 139  --  138  K 4.0  --  3.5  CL 104  --  104  CO2 27  --  25  GLUCOSE 207*  --  154*  BUN 21  --  19  CREATININE 1.08 1.22* 1.09  CALCIUM 8.9  --  9.1  MG  --   --  2.0    Recent Labs Lab 08/30/14 1146  AST 22  ALT 13  ALKPHOS 62  BILITOT 0.4  PROT 7.3  ALBUMIN 3.6     Recent Labs Lab 08/30/14 1146 08/30/14 1705  WBC 5.4 5.5  HGB 14.2 13.7  HCT 43.9 42.2  MCV 89.6 87.6  PLT 244 248   No results for input(s): CKTOTAL, CKMB, CKMBINDEX, TROPONINI in the last 168 hours. No results for input(s): LABPROT, INR in the last 72 hours.  Recent Labs  08/30/14 1318  COLORURINE YELLOW  LABSPEC 1.016  PHURINE 7.0  GLUCOSEU 250*  HGBUR NEGATIVE  BILIRUBINUR NEGATIVE  KETONESUR NEGATIVE  PROTEINUR NEGATIVE  UROBILINOGEN 1.0  NITRITE NEGATIVE  LEUKOCYTESUR NEGATIVE       Component Value Date/Time   CHOL 169 08/31/2014 0605   TRIG 124 08/31/2014 0605   HDL 34* 08/31/2014 0605   CHOLHDL 5.0 08/31/2014 0605   VLDL 25 08/31/2014 0605   LDLCALC 110* 08/31/2014 0605   Lab Results  Component Value Date   HGBA1C 7.5* 05/14/2014   No results found for: LABOPIA, COCAINSCRNUR, LABBENZ, AMPHETMU, THCU, LABBARB  No results for input(s): ETH in the last 168 hours.    Dg Chest 2 View 08/18/2014 1. No evidence of acute cardiopulmonary process. 2. Interval development of a nodular opacity in the lateral aspect of the left lung base on the frontal view. This finding is concerning for a pulmonary nodule and primary bronchogenic carcinoma. Recommend further evaluation with nonemergent CT scan of the chest. 3. Background bronchitic  change and mild interstitial prominence suggests underlying COPD.   Dg Chest 2 View 08/30/2014    No acute abnormality noted. The previously seen nodular density in the left base is not well appreciated on this exam.      Repeat head CT today: RIGHT occipital and posterior temporal infarcts.  Old RIGHT frontal periventricular white matter and RIGHT caudate head infarcts.  Small vessel chronic ischemic changes of deep cerebral white matter.  No significant interval change.   CT of the chest with contrast was ordered for 09/06/2014     Ct Head Wo Contrast 08/30/2014    Chronic ischemic changes as above   Ill-defined hypodensity right occipital parietal lobe may represent subacute infarction. MRI recommended for further evaluation.  Negative for hemorrhage.       MRI / MRA Brain Wo Contrast 08/30/2014    1. Acute nonhemorrhagic infarcts involving the right occipital lobe and posterior inferior right temporal lobe.  2. The right P2 segment occlusion is associated with these infarcts.  3. Acute nonhemorrhagic infarcts involving the anterior right frontal lobe.  4. High-grade tandem stenoses of the distal right M1 segment and associated proximal right M2 segment.  5. Age advanced atrophy and diffuse white matter disease.  6. This corresponds with otherwise seen moderate distal small vessel disease.        PHYSICAL EXAM  GENERAL: This is a very pleasant female in no acute distress.  HEENT: Unremarkable.  ABDOMEN: soft  EXTREMITIES: No edema   BACK:Unremarkable.  SKIN: Normal by inspection.    MENTAL STATUS: Alert and oriented. Speech, language and cognition are generally intact. Judgment and insight fair. There appears to be some left-sided neglect.   CRANIAL NERVES: Pupils are equal, round and reactive to light and accomodation; extra ocular movements are full, there is no significant nystagmus; Visual field shows a dense left homonymous hemianopia; upper and lower facial muscles are normal in strength and symmetric, there is no flattening of the nasolabial folds; tongue is midline; uvula is midline; shoulder elevation is normal.  MOTOR: Right upper extremity shows normal tone, bulk and strength. Right leg slight weakness 4/5 hip flexion. Left upper extremity 3/5 and left lower extremity 3/5.  COORDINATION: Left finger to nose is normal, right finger to nose is normal, No rest tremor; no intention tremor; no postural tremor; no bradykinesia.     ASSESSMENT/PLAN Kayla [name] is a 74 y.o. female with history of hypertension, diabetes mellitus, hyperlipidemia, sickle cell  trait, presenting with altered mental status, left lower facial droop, and a disturbance. She did not receive IV t-PA due to late presentation.   Stroke:  Multiple acute infarct involving the Right frontal lobe,Right temporal lobe and right occipital lobe. The findings are worrisome and suspicious for cardioembolic strokes.  Resultant  Left Homonymous hemianopia and left hemiparesis.  MRI  Acute nonhemorrhagic infarcts involving the right occipital lobe, the posterior inferior right temporal lobe, and the anterior right frontal lobe.  MRA - right P2 segment occlusion is associated with these infarcts. High-grade tandem stenoses of the distal right M1 segment and associated proximal right M2 segment.   Carotid Doppler - 1-39% ICA stenosis. Vertebral artery flow is antegrade.  2D Echo  pending  LDL 110  HgbA1c pending  Lovenox for VTE prophylaxis Diet heart healthy/carb modified  Diet NPO time specified with thin liquids.  no antithrombotic prior to admission, now on aspirin 325 mg orally every day  Ongoing aggressive stroke risk factor management  Therapy recommendations:  CIR - we have M.D. Consult ordered and pending.  Disposition: Pending  We will need to have TEE and the loop recorder given Likely cardioembolic Source.  Hypertension  Home meds:  Atenolol 50 mg daily and Benicar with hydrochlorothiazide  Stable - currently off antihypertensive medications.   Hyperlipidemia  Home meds:  No lipid lowering medications prior to admission.  LDL 110 goal <70  Add Lipitor 20 mg daily  Continue statin at discharge  Diabetes  HgbA1c pending -goal < 7.0  Controlled  Other Stroke Risk Factors  Advanced age  ETOH use  Obesity, Body mass index is 30.36 kg/(m^2).    Other Active Problems    Other Pertinent History  Shellfish allergy  History of abnormal chest x-ray with follow-up CT of the chest scheduled by primary care physician for  09/06/2014.  Consider performing chest CT while inpatient.    PLAN  TEE and loop scheduled for Monday. Cardiology has seen patient.  Consider dual anti platelet therapy due to high-grade tandem stenoses of the distal right M1 segment and  associated proximal right M2 segment.    Rehabilitation M.D. consult pending.  Hospital day #   Mikey Bussing PA-C [facility] Pager [phone] 09/01/2014, 8:25 AM  The patient was seen and examined by me; notes, chart and tests reviewed and discussed with midlevel provider, other providers, patient, and family.      To contact Stroke Continuity provider, please refer to [email or web address] After hours, contact General Neurology

## 2014-09-01 NOTE — Progress Notes (Signed)
Occupational Therapy Treatment Patient Details Name: Kayla [name] MRN: [id number] DOB: [birth year] Today's Date: 09/01/2014    History of present illness 74 yo female with onset of  L side weakness was diagnosed with acute infarcts of R occipital and posterior inferior temp lobe and ant R frontal lobe    OT comments  Pt running into items on left side during ambulation. Continue to recommend CIR for rehab.  Follow Up Recommendations  CIR;Supervision/Assistance - 24 hour    Equipment Recommendations  Other (comment) (defer to next venue)    Recommendations for Other Services Rehab consult;Speech consult    Precautions / Restrictions Precautions Precautions: Fall Restrictions Weight Bearing Restrictions: No       Mobility Bed Mobility               General bed mobility comments: not assesed  Transfers Overall transfer level: Needs assistance Equipment used: Rolling walker (2 wheeled);1 person hand held assist Transfers: Sit to/from Stand Sit to Stand: Min guard              Balance  Min guard-Min assist for ambulation (walked with RW and hand held assist).                                 ADL Overall ADL's : Needs assistance/impaired     Grooming: Oral care;Wash/dry face;Applying deodorant;Set up;Supervision/safety;Standing       Lower Body Bathing: Min guard (standing at sink) Lower Body Bathing Details (indicate cue type and reason): washed peri area/buttocks         Toilet Transfer: Min guard;Minimal assistance;Ambulation;RW (chair; also hand held assist)   Toileting- Clothing Manipulation and Hygiene: Min guard (standing-pulled down briefs to wash bottom/peri area)       Functional mobility during ADLs: Min guard;Minimal assistance;Rolling walker (ambulated with walker and hand held assist)  General ADL Comments: Pt bumping into items on left side. Cues to turn head to left. OT placed ADL items on left side at sink and with time,  pt able to locate. Pt left water running at sink requiring cue from OT to turn off. Talked with pt's daughter at end about d/c recommendation for pt/OT treatment interventions.      Vision                     Perception     Praxis      Cognition  Awake/Alert Behavior During Therapy: WFL for tasks assessed/performed Overall Cognitive Status: Impaired/Different from baseline Area of Impairment: Attention;Problem solving;Memory   Current Attention Level: Sustained Memory: Decreased short-term memory        Problem Solving: Slow processing General Comments: pt forgetting to turn off sink water.     Extremity/Trunk Assessment               Exercises     Shoulder Instructions       General Comments      Pertinent Vitals/ Pain       Pain Assessment: 0-10 Pain Score: 6  Pain Location: bilateral legs Pain Descriptors / Indicators: Sore Pain Intervention(s): Repositioned  Home Living     Available Help at Discharge: Family Type of Home: House                                  Prior Functioning/Environment  Frequency Min 2X/week     Progress Toward Goals  OT Goals(current goals can now be found in the care plan section)  Progress towards OT goals: Progressing toward goals  Acute Rehab OT Goals Patient Stated Goal: not stated OT Goal Formulation: With patient Time For Goal Achievement: 09/07/14 Potential to Achieve Goals: Good ADL Goals Pt Will Perform Grooming: standing;with modified independence (locating items on left side) Pt Will Perform Lower Body Bathing: sit to/from stand;with set-up Pt Will Perform Lower Body Dressing: sit to/from stand;with set-up Pt Will Transfer to Toilet: ambulating;with supervision Pt Will Perform Toileting - Clothing Manipulation and hygiene: sit to/from stand;with modified independence Additional ADL Goal #1: Pt will independently perform HEP for LUE to increase strength and  coordination.  Plan Discharge plan remains appropriate    Co-evaluation                 End of Session Equipment Utilized During Treatment: Gait belt;Rolling walker   Activity Tolerance Patient limited by fatigue   Patient Left in chair;with family/visitor present   Nurse Communication          Time: 3267-1245 OT Time Calculation (min): 17 min  Charges: OT General Charges $OT Visit: 1 Procedure OT Treatments $Self Care/Home Management : 8-22 mins  Kayla [name] OTR/L [phone] 09/01/2014, 5:10 PM

## 2014-09-01 NOTE — Progress Notes (Signed)
[facility] PROGRESS NOTE  Kayla [name] EUM:[id number] DOB: 1941-05-18 DOA: 08/30/2014 PCP: Olga Millers, MD  Assessment/Plan: 1-multiple acute/subacute ischemic stroke: patient presented with left side weakness and confusion -MRI/MRA with acute infarcts involving the right frontal lobe in a watershed distribution, right medial temporal lobe and right occipital lobe. The combination of findings is highly suspicious for cardioembolic phenomenon. -carotid duplex: bilateral ica stenosis 1-39%; vertebral flow antegrade  -2-D echo: pending -per neurology will need: TEE and loop recorder -continue ASA for secondary prevention -patient with LDL 110 (goal is < 70): will continue lipitor -PT/OT: recommending CIR; will follow  2-essential HTN: fair control -continue atenolol  -will be permissive in setting of acute stroke -hydralazine PRN ordered -also due to AKI benicar was place on hold  3-AKI: holding diuretics and olmesartan -gentle hydration given -Cr back to normal (1.09 on 3/13) -will monitor renal function  4-diabetes mellitus: continue SSI -A1C pending  5-HLD: continue statins as mentioned above  6-residual left sided weakness: patient to be seen by CIR as recommended by PT -will monitor  DVT: lovenox  Code Status: Full Family Communication: daughter at bedside Disposition Plan: to be determine; PT recommending CIR; TEE on 3/14   Consultants:  Neurology   Cardiology for TEE/loop recorder  Procedures:  2-D echo pending  Carotid duplex: 1-39% ICA stenosis bilaterally, vertebral flow antegrade  TEE:   Antibiotics:  None   HPI/Subjective: complaining of left side weakness; no fever. No CP or SOB. Early morning with worsening of these symptoms and increase NIH to 12 from 5. CT head repeated and no new changes appreciated.  Objective: Filed Vitals:   09/01/14 1413  BP: 136/70  Pulse: 70  Temp: 99 F (37.2 C)  Resp: 18   No intake or output  data in the 24 hours ending 09/01/14 1627 Filed Weights   08/30/14 1113  Weight: 85.276 kg (188 lb)    Exam:   General:  AAOX2, complaining of left side weakness; no fever. No CP or SOB. Early morning with worsening of these symptoms and increase NIH to 12 from 5.  Cardiovascular: S1 and S2, no rubs or gallops; positive SEM  Respiratory: CTA bilaterally  Abdomen: soft, NT, ND, positive BS  Musculoskeletal: no edema or cyanosis   Neuro: left side weakness with MS 3/5, patient also reported paresthesia; normal speech and she is AAOX3   Data Reviewed: Basic Metabolic Panel:  Recent Labs Lab 08/30/14 1146 08/30/14 1705 09/01/14 0615  NA 139  --  138  K 4.0  --  3.5  CL 104  --  104  CO2 27  --  25  GLUCOSE 207*  --  154*  BUN 21  --  19  CREATININE 1.08 1.22* 1.09  CALCIUM 8.9  --  9.1  MG  --   --  2.0   Liver Function Tests:  Recent Labs Lab 08/30/14 1146  AST 22  ALT 13  ALKPHOS 62  BILITOT 0.4  PROT 7.3  ALBUMIN 3.6   CBC:  Recent Labs Lab 08/30/14 1146 08/30/14 1705  WBC 5.4 5.5  HGB 14.2 13.7  HCT 43.9 42.2  MCV 89.6 87.6  PLT 244 248   CBG:  Recent Labs Lab 08/31/14 1139 08/31/14 1655 08/31/14 2219 09/01/14 0645 09/01/14 1205  GLUCAP 130* 131* 146* 156* 167*   Studies: Ct Head Wo Contrast  09/01/2014   CLINICAL DATA:  Stroke, worsening speech, LEFT side neglect, dull RIGHT side headache  EXAM: CT HEAD  WITHOUT CONTRAST  TECHNIQUE: Contiguous axial images were obtained from the base of the skull through the vertex without intravenous contrast.  COMPARISON:  08/30/2014  FINDINGS: Normal ventricular morphology.  No midline shift or mass effect.  Old RIGHT frontal periventricular white matter at RIGHT caudate head infarcts.  RIGHT occipital and posterior temporal infarcts.  Small vessel chronic ischemic changes deep cerebral white matter.  No new areas of infarction or intracranial hemorrhage.  No mass lesion or extra-axial fluid collections.   Atherosclerotic calcification of carotid siphons.  Bones and sinuses unremarkable.  IMPRESSION: RIGHT occipital and posterior temporal infarcts.  Old RIGHT frontal periventricular white matter and RIGHT caudate head infarcts.  Small vessel chronic ischemic changes of deep cerebral white matter.  No significant interval change.   Electronically Signed   By: Lavonia Dana M.D.   On: 09/01/2014 09:40   Mr Brain Wo Contrast  08/30/2014   CLINICAL DATA:  Confusion. Stroke symptoms. Unsteady. Abnormal speech.  EXAM: MRI HEAD WITHOUT CONTRAST  MRA HEAD WITHOUT CONTRAST  TECHNIQUE: Multiplanar, multiecho pulse sequences of the brain and surrounding structures were obtained without intravenous contrast. Angiographic images of the head were obtained using MRA technique without contrast.  COMPARISON:  CT head without contrast from the same day.  FINDINGS: MRI HEAD FINDINGS  An acute nonhemorrhagic infarct is noted within the posterior right occipital and anterior right temporal lobe. Additional areas of acute nonhemorrhagic infarction are present within the anterior right frontal lobe. These correspond with the areas of hypoattenuation on CT, some of which were felt to be chronic. T2 changes are associated with the areas of acute infarction.  A remote lacunar infarct involves the right caudate head and lentiform nucleus. Additional smaller remote lacunar infarcts are present within the basal ganglia and corona radiata bilaterally. Extensive periventricular and subcortical white matter changes are noted. White matter disease extends into the brainstem.  Flow is present in the major intracranial arteries. Bilateral lens replacements are noted. A polyp or mucous retention cyst is present in the inferior left maxillary sinus. The remaining paranasal sinuses and the mastoid air cells are clear. The skullbase is unremarkable. Midline structures are within normal limits.  MRA HEAD FINDINGS  Atherosclerotic changes are present within  the cavernous internal carotid arteries bilaterally a 50% stenosis is present in the precavernous left internal carotid artery. There is no significant stenosis of greater than 50% in the right internal carotid artery. There is some irregularity within the left M1 segment and bilateral A1 segments. The anterior communicating artery is not definitively seen. There is a high-grade stenosis of the distal right M1 segment and a tandem more distal stenosis in the a proximal right M2 branch. There is moderate attenuation of more much more distal left MCA branches.  The vertebral arteries are small bilaterally. The left PICA origin is visualized and normal. The right PICA is not visualized. The vertebrobasilar junction is within normal limits. There is mild narrowing of the distal basilar artery, less than 50%. A high-grade stenosis is present and and proximal left P1 segment. The right P2 segment is occluded. There is attenuation of distal left PCA branch vessels.  IMPRESSION: 1. Acute nonhemorrhagic infarcts involving the right occipital lobe and posterior inferior right temporal lobe. 2. The right P2 segment occlusion is associated with these infarcts. 3. Acute nonhemorrhagic infarcts involving the anterior right frontal lobe. 4. High-grade tandem stenoses of the distal right M1 segment and associated proximal right M2 segment. 5. Age advanced atrophy and diffuse  white matter disease. 6. This corresponds with otherwise seen moderate distal small vessel disease.   Electronically Signed   By: San Morelle M.D.   On: 08/30/2014 19:55   Mr Jodene Nam Head/brain Wo Cm  08/30/2014   CLINICAL DATA:  Confusion. Stroke symptoms. Unsteady. Abnormal speech.  EXAM: MRI HEAD WITHOUT CONTRAST  MRA HEAD WITHOUT CONTRAST  TECHNIQUE: Multiplanar, multiecho pulse sequences of the brain and surrounding structures were obtained without intravenous contrast. Angiographic images of the head were obtained using MRA technique without  contrast.  COMPARISON:  CT head without contrast from the same day.  FINDINGS: MRI HEAD FINDINGS  An acute nonhemorrhagic infarct is noted within the posterior right occipital and anterior right temporal lobe. Additional areas of acute nonhemorrhagic infarction are present within the anterior right frontal lobe. These correspond with the areas of hypoattenuation on CT, some of which were felt to be chronic. T2 changes are associated with the areas of acute infarction.  A remote lacunar infarct involves the right caudate head and lentiform nucleus. Additional smaller remote lacunar infarcts are present within the basal ganglia and corona radiata bilaterally. Extensive periventricular and subcortical white matter changes are noted. White matter disease extends into the brainstem.  Flow is present in the major intracranial arteries. Bilateral lens replacements are noted. A polyp or mucous retention cyst is present in the inferior left maxillary sinus. The remaining paranasal sinuses and the mastoid air cells are clear. The skullbase is unremarkable. Midline structures are within normal limits.  MRA HEAD FINDINGS  Atherosclerotic changes are present within the cavernous internal carotid arteries bilaterally a 50% stenosis is present in the precavernous left internal carotid artery. There is no significant stenosis of greater than 50% in the right internal carotid artery. There is some irregularity within the left M1 segment and bilateral A1 segments. The anterior communicating artery is not definitively seen. There is a high-grade stenosis of the distal right M1 segment and a tandem more distal stenosis in the a proximal right M2 branch. There is moderate attenuation of more much more distal left MCA branches.  The vertebral arteries are small bilaterally. The left PICA origin is visualized and normal. The right PICA is not visualized. The vertebrobasilar junction is within normal limits. There is mild narrowing of the  distal basilar artery, less than 50%. A high-grade stenosis is present and and proximal left P1 segment. The right P2 segment is occluded. There is attenuation of distal left PCA branch vessels.  IMPRESSION: 1. Acute nonhemorrhagic infarcts involving the right occipital lobe and posterior inferior right temporal lobe. 2. The right P2 segment occlusion is associated with these infarcts. 3. Acute nonhemorrhagic infarcts involving the anterior right frontal lobe. 4. High-grade tandem stenoses of the distal right M1 segment and associated proximal right M2 segment. 5. Age advanced atrophy and diffuse white matter disease. 6. This corresponds with otherwise seen moderate distal small vessel disease.   Electronically Signed   By: San Morelle M.D.   On: 08/30/2014 19:55    Scheduled Meds: . aspirin  300 mg Rectal Daily   Or  . aspirin  325 mg Oral Daily  . atenolol  50 mg Oral Daily  . atorvastatin  20 mg Oral q1800  . enoxaparin (LOVENOX) injection  40 mg Subcutaneous Q24H  . insulin aspart  0-9 Units Subcutaneous TID AC & HS   Continuous Infusions:   Principal Problem:   Altered mental status Active Problems:   HYPERCHOLESTEROLEMIA, MILD   Essential hypertension  Type 2 diabetes mellitus with diabetic neuropathy   Acute ischemic right MCA stroke   Acute right PCA stroke   AKI (acute kidney injury)   Confusion    Time spent: 59 minutes   Barton Dubois  [facility] Pager [phone]. If 7PM-7AM, please contact night-coverage at [email or web address], password [facility] 09/01/2014, 4:27 PM  LOS: 1 day

## 2014-09-01 NOTE — Evaluation (Signed)
Speech Language Pathology Evaluation Patient Details Name: Kayla [name] MRN: [id number] DOB: [birth year] Today's Date: 09/01/2014 Time: 0051-1021 SLP Time Calculation (min) (ACUTE ONLY): 13 min  Problem List:  Patient Active Problem List   Diagnosis Date Noted  . AKI (acute kidney injury)   . Confusion   . Altered mental status 08/30/2014  . Acute ischemic right MCA stroke 08/30/2014  . Acute right PCA stroke 08/30/2014  . Abnormal chest x-ray 08/22/2014  . Type 2 diabetes mellitus with diabetic neuropathy 01/04/2014  . HYPERCHOLESTEROLEMIA, MILD 05/22/2009  . PERS HX NONCOMPLIANCE W/MED TX PRS HAZARDS HLTH 07/11/2007  . Essential hypertension 05/04/2007  . Osteoarthritis 05/04/2007  . Fibromyalgia 05/04/2007   Past Medical History:  Past Medical History  Diagnosis Date  . Asthmatic bronchitis   . Hypertension   . Venous insufficiency   . Diabetes mellitus   . Hypercholesteremia   . Diverticulosis of colon   . Constipation   . Prolapse of vaginal vault after hysterectomy   . Proteinuria   . DJD (degenerative joint disease)   . Fibromyalgia   . Somatic dysfunction   . Anxiety   . Personal history of noncompliance with medical treatment, presenting hazards to health   . Sickle-cell trait   . Allergy     Shell fish  . GERD (gastroesophageal reflux disease)    Past Surgical History:  Past Surgical History  Procedure Laterality Date  . Abdominal hysterectomy    . Robotic assisted laparoscopic sacrocolpopexy  09/2009    Dr. Matilde Sprang  . Cataract extraction w/ intraocular lens  implant, bilateral  2012   HPI:  74 yo female with onset of L side weakness was diagnosed with acute infarcts of R occipital and posterior inferior temp lobe and ant R frontal lobe    Assessment / Plan / Recommendation Clinical Impression  Pt presents with mild deficits in problem-solving, visual/spatial construction, short-term memory and awareness.  Recommend SLP to address cognition and  further SLP f/u in next venue of care to maximize independence.     SLP Assessment  Patient needs continued Speech Lanaguage Pathology Services    Follow Up Recommendations  Inpatient Rehab    Frequency and Duration min 2x/week  1 week   Pertinent Vitals/Pain Pain Assessment: No/denies pain   SLP Goals  Potential to Achieve Goals (ACUTE ONLY): Good  SLP Evaluation Prior Functioning  Type of Home: House Available Help at Discharge: Family   Cognition  Overall Cognitive Status: Impaired/Different from baseline Arousal/Alertness: Awake/alert Orientation Level: Oriented X4 Attention: Alternating Alternating Attention: Impaired Memory:  (mild deficits in short-term recall) Awareness: Impaired Awareness Impairment: Intellectual impairment Problem Solving: Impaired Problem Solving Impairment: Verbal complex Safety/Judgment:  (tba)    Comprehension  Auditory Comprehension Overall Auditory Comprehension: Appears within functional limits for tasks assessed Visual Recognition/Discrimination Discrimination: Within Function Limits    Expression Expression Primary Mode of Expression: Verbal Verbal Expression Overall Verbal Expression: Appears within functional limits for tasks assessed Written Expression Dominant Hand: Right Written Expression:  (decreased ability to copy cube/draw clock with numbers)   Oral / Motor Oral Motor/Sensory Function Overall Oral Motor/Sensory Function: Appears within functional limits for tasks assessed Motor Speech Overall Motor Speech: Appears within functional limits for tasks assessed   [name] L. Tivis Ringer, [place] CCC/SLP Pager [phone]      [name] 09/01/2014, 3:24 PM

## 2014-09-01 NOTE — Progress Notes (Signed)
NIHHS score increased from 5 to 12 overnight. Patient complaining of mild dull headache. MD paged. Order for CT head placed. Transport on way for patient. Patient refuses medication for headache. Will continue to monitor closely.

## 2014-09-02 ENCOUNTER — Encounter (HOSPITAL_COMMUNITY)
Admission: EM | Disposition: A | Discharge status: Rehabilitation Facility | Payer: Self-pay | Source: Home / Self Care | Attending: Internal Medicine

## 2014-09-02 ENCOUNTER — Encounter (HOSPITAL_COMMUNITY): Payer: Self-pay | Admitting: *Deleted

## 2014-09-02 DIAGNOSIS — I340 Nonrheumatic mitral (valve) insufficiency: Secondary | ICD-10-CM

## 2014-09-02 DIAGNOSIS — I63511 Cerebral infarction due to unspecified occlusion or stenosis of right middle cerebral artery: Secondary | ICD-10-CM

## 2014-09-02 HISTORY — PX: TEE WITHOUT CARDIOVERSION: SHX5443

## 2014-09-02 LAB — GLUCOSE, CAPILLARY
GLUCOSE-CAPILLARY: 163 mg/dL — AB (ref 70–99)
Glucose-Capillary: 152 mg/dL — ABNORMAL HIGH (ref 70–99)
Glucose-Capillary: 124 mg/dL — ABNORMAL HIGH (ref 70–99)
Glucose-Capillary: 137 mg/dL — ABNORMAL HIGH (ref 70–99)
Glucose-Capillary: 133 mg/dL — ABNORMAL HIGH (ref 70–99)

## 2014-09-02 LAB — HEMOGLOBIN A1C
HEMOGLOBIN A1C: 7.8 % — AB (ref 4.8–5.6)
MEAN PLASMA GLUCOSE: 177 mg/dL

## 2014-09-02 SURGERY — ECHOCARDIOGRAM, TRANSESOPHAGEAL
Anesthesia: Moderate Sedation

## 2014-09-02 MED ORDER — MIDAZOLAM HCL 5 MG/ML IJ SOLN
INTRAMUSCULAR | Status: AC
  Filled 2014-09-02: qty 2

## 2014-09-02 MED ORDER — DIPHENHYDRAMINE HCL 50 MG/ML IJ SOLN
INTRAMUSCULAR | Status: AC
  Filled 2014-09-02: qty 1

## 2014-09-02 MED ORDER — IRBESARTAN 300 MG PO TABS
300.0000 mg | ORAL_TABLET | Freq: Every day | ORAL | Status: DC
  Administered 2014-09-02 – 2014-09-05 (×4): 300 mg via ORAL
  Filled 2014-09-02 (×4): qty 1

## 2014-09-02 MED ORDER — BUTAMBEN-TETRACAINE-BENZOCAINE 2-2-14 % EX AERO
INHALATION_SPRAY | CUTANEOUS | Status: DC | PRN
  Administered 2014-09-02: 2 via TOPICAL

## 2014-09-02 MED ORDER — FENTANYL CITRATE 0.05 MG/ML IJ SOLN
INTRAMUSCULAR | Status: AC
  Filled 2014-09-02: qty 2

## 2014-09-02 MED ORDER — MIDAZOLAM HCL 10 MG/2ML IJ SOLN
INTRAMUSCULAR | Status: DC | PRN
  Administered 2014-09-02: 2 mg via INTRAVENOUS

## 2014-09-02 MED ORDER — POLYETHYLENE GLYCOL 3350 17 G PO PACK
17.0000 g | PACK | Freq: Every day | ORAL | Status: DC
  Administered 2014-09-02 – 2014-09-05 (×4): 17 g via ORAL
  Filled 2014-09-02 (×4): qty 1

## 2014-09-02 MED ORDER — HYDROCHLOROTHIAZIDE 25 MG PO TABS
25.0000 mg | ORAL_TABLET | Freq: Every day | ORAL | Status: DC
  Administered 2014-09-02 – 2014-09-05 (×4): 25 mg via ORAL
  Filled 2014-09-02 (×4): qty 1

## 2014-09-02 MED ORDER — SENNOSIDES-DOCUSATE SODIUM 8.6-50 MG PO TABS
1.0000 | ORAL_TABLET | Freq: Two times a day (BID) | ORAL | Status: DC
  Administered 2014-09-02 – 2014-09-05 (×6): 1 via ORAL
  Filled 2014-09-02 (×6): qty 1

## 2014-09-02 MED ORDER — SODIUM CHLORIDE 0.9 % IV SOLN
INTRAVENOUS | Status: DC
  Administered 2014-09-02: 500 mL via INTRAVENOUS

## 2014-09-02 NOTE — Progress Notes (Signed)
Physical Therapy Treatment Patient Details Name: Kayla [name] MRN: [id number] DOB: 02-28-1941 Today's Date: 09/02/2014    History of Present Illness 74 yo female with onset of  L side weakness was diagnosed with acute infarcts of R occipital and posterior inferior temp lobe and ant R frontal lobe     PT Comments    Patient progressing slowly with mobility. Impulsive with ambulation and easily distracted resulting in balance deficits putting pt at increased risk for falls. Pt with decreased foot clearance on left requiring Min A for advancement of LLE. Highly motivated. Continues to be appropriate for CIR. Will continue to follow.   Follow Up Recommendations  CIR;Supervision/Assistance - 24 hour     Equipment Recommendations  Other (comment) (TBD)    Recommendations for Other Services       Precautions / Restrictions Precautions Precautions: Fall Restrictions Weight Bearing Restrictions: No    Mobility  Bed Mobility Overal bed mobility: Needs Assistance Bed Mobility: Supine to Sit     Supine to sit: Supervision;HOB elevated     General bed mobility comments: Use of rails.   Transfers Overall transfer level: Needs assistance Equipment used: None Transfers: Sit to/from Stand Sit to Stand: Min guard         General transfer comment: Min guard to stand from EOB initially with LEs being supported on bed rails.   Ambulation/Gait Ambulation/Gait assistance: Min assist Ambulation Distance (Feet): 150 Feet Assistive device: 1 person hand held assist Gait Pattern/deviations: Step-through pattern;Decreased stride length;Decreased dorsiflexion - left;Scissoring;Drifts right/left   Gait velocity interpretation: Below normal speed for age/gender General Gait Details: Pt impulsive during gait with CoM becoming anterior to BoS. 1 instance of scissoring gait noted. Easily distracted resulting in drifting during head movements. Requires Min A to advance LLE secondary to  decreased foot clearance.    Stairs            Wheelchair Mobility    Modified Rankin (Stroke Patients Only) Modified Rankin (Stroke Patients Only) Pre-Morbid Rankin Score: Slight disability Modified Rankin: Moderately severe disability     Balance Overall balance assessment: Needs assistance Sitting-balance support: Feet supported;No upper extremity supported Sitting balance-Leahy Scale: Fair     Standing balance support: During functional activity Standing balance-Leahy Scale: Fair                      Cognition Arousal/Alertness: Awake/alert Behavior During Therapy: WFL for tasks assessed/performed Overall Cognitive Status: Impaired/Different from baseline Area of Impairment: Problem solving;Safety/judgement         Safety/Judgement: Decreased awareness of safety ("I can easily walk without my RW.")   Problem Solving: Slow processing;Requires verbal cues General Comments: requires repetition to perform tasks; easily distracted.    Exercises Other Exercises Other Exercises: Sit to stand x5 from EOB with cues for eccentric control of quads and slow descent onto surface.     General Comments        Pertinent Vitals/Pain Pain Assessment: Faces Faces Pain Scale: Hurts little more Pain Location: RLE with DF Pain Descriptors / Indicators: Sore Pain Intervention(s): Repositioned;Monitored during session    Home Living                      Prior Function            PT Goals (current goals can now be found in the care plan section) Progress towards PT goals: Progressing toward goals    Frequency  Min 4X/week  PT Plan Current plan remains appropriate    Co-evaluation             End of Session Equipment Utilized During Treatment: Gait belt Activity Tolerance: Patient tolerated treatment well Patient left: in chair;with call bell/phone within reach;with chair alarm set;with family/visitor present     Time: 1413-1430 PT  Time Calculation (min) (ACUTE ONLY): 17 min  Charges:  $Gait Training: 8-22 mins                    G CodesCandy Sledge A 09-21-2014, 2:44 PM Candy Sledge, [place], DPT [phone]

## 2014-09-02 NOTE — Interval H&P Note (Signed)
History and Physical Interval Note:  09/02/2014 11:25 AM  Kayla [name]  has presented today for surgery, with the diagnosis of stroke  The various methods of treatment have been discussed with the patient and family. After consideration of risks, benefits and other options for treatment, the patient has consented to  Procedure(s): TRANSESOPHAGEAL ECHOCARDIOGRAM (TEE) (N/A) as a surgical intervention .  The patient's history has been reviewed, patient examined, no change in status, stable for surgery.  I have reviewed the patient's chart and labs.  Questions were answered to the patient's satisfaction.     Kirk Ruths

## 2014-09-02 NOTE — H&P (View-Only) (Signed)
Physical Medicine and Rehabilitation Consult Reason for Consult: Acute nonhemorrhagic right occipital lobe posterior inferior right temporal lobe anterior right frontal lobe Referring Physician: [facility]   HPI: Kayla [name] is a 74 y.o. right handed female with history of hypertension, diabetes mellitus and peripheral neuropathy, sickle cell trait and recent bronchitis. Independently with occasional cane prior to admission living with her husband. Admitted 08/30/2014 with altered mental status and left-sided weakness. MRI of the brain showed acute nonhemorrhagic infarct involving the right occipital lobe and posterior inferior right temporal lobe. MRA of the head showed high-grade tandem stenosis of the distal right M1 segment and associated proximal right M2 segment. Carotid Dopplers with no ICA stenosis. Patient did not receive TPA. Echocardiogram/TEE pending. Await plan for possible loop recorder implant. Neurology consulted presently on aspirin for CVA prophylaxis as well as subcutaneous Lovenox for DVT prophylaxis.   Review of Systems  Respiratory: Positive for cough.   Gastrointestinal: Positive for constipation.       GERD  Musculoskeletal: Positive for myalgias and joint pain.  Psychiatric/Behavioral:       Anxiety  All other systems reviewed and are negative.  Past Medical History  Diagnosis Date  . Asthmatic bronchitis   . Hypertension   . Venous insufficiency   . Diabetes mellitus   . Hypercholesteremia   . Diverticulosis of colon   . Constipation   . Prolapse of vaginal vault after hysterectomy   . Proteinuria   . DJD (degenerative joint disease)   . Fibromyalgia   . Somatic dysfunction   . Anxiety   . Personal history of noncompliance with medical treatment, presenting hazards to health   . Sickle-cell trait   . Allergy     Shell fish  . GERD (gastroesophageal reflux disease)    Past Surgical History  Procedure Laterality Date  . Abdominal hysterectomy      . Robotic assisted laparoscopic sacrocolpopexy  09/2009    Dr. Matilde Sprang  . Cataract extraction w/ intraocular lens  implant, bilateral  2012   Family History  Problem Relation Age of Onset  . Prostate cancer Father    Social History:  reports that she has never smoked. She has never used smokeless tobacco. She reports that she drinks alcohol. She reports that she does not use illicit drugs. Allergies:  Allergies  Allergen Reactions  . Fish Allergy Hives  . Shellfish Allergy Hives  . Penicillins     REACTION: red rash  . Tomato Rash   Medications Prior to Admission  Medication Sig Dispense Refill  . albuterol (PROVENTIL HFA;VENTOLIN HFA) 108 (90 BASE) MCG/ACT inhaler Inhale 2 puffs into the lungs every 6 (six) hours as needed for wheezing or shortness of breath.    [name] atenolol (TENORMIN) 50 MG tablet Take 1 tablet (50 mg total) by mouth daily. 3 tablet 6  . benzonatate (TESSALON) 100 MG capsule Take 1 capsule (100 mg total) by mouth 3 (three) times daily as needed for cough. 21 capsule 0  . Blood Glucose Monitoring Suppl (ACCU-CHEK AVIVA PLUS) W/DEVICE KIT Test blood sugar once daily 1 kit 0  . Diphenhyd-Hydrocort-Nystatin (FIRST-DUKES MOUTHWASH) SUSP 1 teaspoon by mouth gargle and swallow four times daily as needed 120 mL 1  . glucose blood (ACCU-CHEK AVIVA) test strip Check blood sugar once daily 100 each 4  . metFORMIN (GLUCOPHAGE) 500 MG tablet TAKE 1 TABLET DAILY WITH BREAKFAST 90 tablet 3  . olmesartan-hydrochlorothiazide (BENICAR HCT) 40-25 MG per tablet Take 1 tablet by  mouth daily.        Home: Home Living Family/patient expects to be discharged to:: Private residence Living Arrangements: Spouse/significant other Available Help at Discharge: Family Type of Home: House Home Access: Stairs to enter Technical brewer of Steps: 2 Entrance Stairs-Rails: Right, Left, Can reach both Home Layout: One level Home Equipment: Shower seat, Toilet riser  Functional  History: Prior Function Level of Independence: Independent with assistive device(s) (walking without cane usually recently) Functional Status:  Mobility: Bed Mobility Overal bed mobility: Needs Assistance Bed Mobility: Supine to Sit, Sit to Supine Rolling: Min assist Supine to sit: Supervision Sit to supine: Supervision General bed mobility comments: not assesed Transfers Overall transfer level: Needs assistance Equipment used: Rolling walker (2 wheeled), 1 person hand held assist Transfers: Sit to/from Stand Sit to Stand: Min guard Stand pivot transfers: Min assist General transfer comment: assist to control descent to bed.  Ambulation/Gait Ambulation/Gait assistance: Min assist Ambulation Distance (Feet): 8 Feet Assistive device: Rolling walker (2 wheeled), 1 person hand held assist Gait Pattern/deviations: Step-through pattern, Decreased dorsiflexion - right, Decreased dorsiflexion - left, Shuffle, Wide base of support (8' x 2) Gait velocity: reduced Gait velocity interpretation: Below normal speed for age/gender    ADL: ADL Overall ADL's : Needs assistance/impaired Eating/Feeding: Minimal assistance, Sitting Grooming: Oral care, Wash/dry face, Applying deodorant, Set up, Supervision/safety, Standing Upper Body Bathing: Minimal assitance, Sitting Lower Body Bathing: Min guard (standing at sink) Lower Body Bathing Details (indicate cue type and reason): washed peri area/buttocks Upper Body Dressing : Minimal assistance, Sitting Lower Body Dressing: Minimal assistance, Sit to/from stand Toilet Transfer: Min guard, Minimal assistance, Ambulation, RW (chair; also hand held assist) Toileting- Clothing Manipulation and Hygiene: Min guard (standing-pulled down briefs to wash bottom/peri area) Functional mobility during ADLs: Min guard, Minimal assistance, Rolling walker (walked with walker and hand held assist) General ADL Comments: Pt bumping into items on left side. Cues to  turn head to left. OT placed ADL items on left side at sink and with time, pt able to locate. Pt left water running at sink requiring cue from OT to turn off. Talked with pt's daughter at end about d/c recommendation for pt/OT treatment interventions.  Cognition: Cognition Overall Cognitive Status: Impaired/Different from baseline Arousal/Alertness: Awake/alert Orientation Level: Oriented X4 Attention: Alternating Alternating Attention: Impaired Memory:  (mild deficits in short-term recall) Awareness: Impaired Awareness Impairment: Intellectual impairment Problem Solving: Impaired Problem Solving Impairment: Verbal complex Safety/Judgment:  (tba) Cognition Arousal/Alertness: Awake/alert Behavior During Therapy: WFL for tasks assessed/performed Overall Cognitive Status: Impaired/Different from baseline Area of Impairment: Attention, Problem solving, Memory Current Attention Level: Sustained Memory: Decreased short-term memory Problem Solving: Slow processing General Comments: pt forgetting to turn off sink water.   Blood pressure 154/70, pulse 76, temperature 98.6 F (37 C), temperature source Oral, resp. rate 16, height $RemoveBe'5\' 6"'aMytBnePA$  (1.676 m), weight 85.276 kg (188 lb), SpO2 97 %. Physical Exam  HENT:  Head: Normocephalic.  Eyes: EOM are normal.  Neck: Normal range of motion. Neck supple. No thyromegaly present.  Cardiovascular: Normal rate and regular rhythm.   Respiratory: Effort normal and breath sounds normal. No respiratory distress.  GI: Soft. Bowel sounds are normal. She exhibits no distension.  Neurological:  Mood is flat but appropriate. She makes good eye contact with examiner. She can provide her name, age and date of birth. Follows simple commands. Fair awareness of her deficits. LUE: 2+ to 3-/5 prox to distal. LLE: 2+ HF, 3- knee and 3+ ankle. Left central 7. speeech  slightly slurred  Skin: Skin is warm and dry.  Psychiatric: She has a normal mood and affect. Her behavior  is normal.    Results for orders placed or performed during the hospital encounter of 08/30/14 (from the past 24 hour(s))  Basic metabolic panel     Status: Abnormal   Collection Time: 09/01/14  6:15 AM  Result Value Ref Range   Sodium 138 135 - 145 mmol/L   Potassium 3.5 3.5 - 5.1 mmol/L   Chloride 104 96 - 112 mmol/L   CO2 25 19 - 32 mmol/L   Glucose, Bld 154 (H) 70 - 99 mg/dL   BUN 19 6 - 23 mg/dL   Creatinine, Ser 1.09 0.50 - 1.10 mg/dL   Calcium 9.1 8.4 - 10.5 mg/dL   GFR calc non Af Amer 49 (L) >90 mL/min   GFR calc Af Amer 57 (L) >90 mL/min   Anion gap 9 5 - 15  Magnesium     Status: None   Collection Time: 09/01/14  6:15 AM  Result Value Ref Range   Magnesium 2.0 1.5 - 2.5 mg/dL  Glucose, capillary     Status: Abnormal   Collection Time: 09/01/14  6:45 AM  Result Value Ref Range   Glucose-Capillary 156 (H) 70 - 99 mg/dL   Comment 1 Notify RN   Glucose, capillary     Status: Abnormal   Collection Time: 09/01/14 12:05 PM  Result Value Ref Range   Glucose-Capillary 167 (H) 70 - 99 mg/dL  Glucose, capillary     Status: Abnormal   Collection Time: 09/01/14  4:30 PM  Result Value Ref Range   Glucose-Capillary 124 (H) 70 - 99 mg/dL   Ct Head Wo Contrast  09/01/2014   CLINICAL DATA:  Stroke, worsening speech, LEFT side neglect, dull RIGHT side headache  EXAM: CT HEAD WITHOUT CONTRAST  TECHNIQUE: Contiguous axial images were obtained from the base of the skull through the vertex without intravenous contrast.  COMPARISON:  08/30/2014  FINDINGS: Normal ventricular morphology.  No midline shift or mass effect.  Old RIGHT frontal periventricular white matter at RIGHT caudate head infarcts.  RIGHT occipital and posterior temporal infarcts.  Small vessel chronic ischemic changes deep cerebral white matter.  No new areas of infarction or intracranial hemorrhage.  No mass lesion or extra-axial fluid collections.  Atherosclerotic calcification of carotid siphons.  Bones and sinuses  unremarkable.  IMPRESSION: RIGHT occipital and posterior temporal infarcts.  Old RIGHT frontal periventricular white matter and RIGHT caudate head infarcts.  Small vessel chronic ischemic changes of deep cerebral white matter.  No significant interval change.   Electronically Signed   By: Lavonia Dana M.D.   On: 09/01/2014 09:40    Assessment/Plan: Diagnosis: right brain infarcts with left hemiparesis 1. Does the need for close, 24 hr/day medical supervision in concert with the patient's rehab needs make it unreasonable for this patient to be served in a less intensive setting? Yes 2. Co-Morbidities requiring supervision/potential complications: dm2, aki 3. Due to bladder management, bowel management, safety, skin/wound care, disease management, medication administration, pain management and patient education, does the patient require 24 hr/day rehab nursing? Yes 4. Does the patient require coordinated care of a physician, rehab nurse, PT (1-2 hrs/day, 5 days/week), OT (1-2 hrs/day, 5 days/week) and SLP (1-2 hrs/day, 5 days/week) to address physical and functional deficits in the context of the above medical diagnosis(es)? Yes Addressing deficits in the following areas: balance, endurance, locomotion, strength, transferring, bowel/bladder control, bathing, dressing,  feeding, grooming, toileting, speech and psychosocial support 5. Can the patient actively participate in an intensive therapy program of at least 3 hrs of therapy per day at least 5 days per week? Yes 6. The potential for patient to make measurable gains while on inpatient rehab is excellent 7. Anticipated functional outcomes upon discharge from inpatient rehab are modified independent  with PT, modified independent with OT, modified independent with SLP. 8. Estimated rehab length of stay to reach the above functional goals is: 7-10 days 9. Does the patient have adequate social supports and living environment to accommodate these discharge  functional goals? Yes 10. Anticipated D/C setting: Home 11. Anticipated post D/C treatments: HH therapy and Outpatient therapy 12. Overall Rehab/Functional Prognosis: excellent  RECOMMENDATIONS: This patient's condition is appropriate for continued rehabilitative care in the following setting: CIR Patient has agreed to participate in recommended program. Yes Note that insurance prior authorization may be required for reimbursement for recommended care.  Comment: Rehab Admissions Coordinator to follow up.  Thanks,  Meredith Staggers, MD, Mellody Drown     09/02/2014

## 2014-09-02 NOTE — Progress Notes (Signed)
I met with pt and her daughter at bedside. We discussed an inpt rehab admission pending [employer] approval and medical workup completion. I have begun authorization and will follow up tomorrow. [phone]

## 2014-09-02 NOTE — Progress Notes (Signed)
[facility] PROGRESS NOTE  Kayla [name] BWG:[id number] DOB: 1940-12-06 DOA: 08/30/2014 PCP: Olga Millers, MD  Assessment/Plan: 1-multiple acute/subacute ischemic stroke: patient presented with left side weakness and confusion -MRI/MRA with acute infarcts involving the right frontal lobe in a watershed distribution, right medial temporal lobe and right occipital lobe. The combination of findings is highly suspicious for cardioembolic phenomenon. -carotid duplex: bilateral ica stenosis 1-39%; vertebral flow antegrade  -2-D echo: no source og emboli, preserved EF -TEE not showing PFO or source of emboli; waiting final decision on loop recorder -continue ASA for secondary prevention -patient with LDL 110 (goal is < 70): will continue lipitor -PT/OT: recommending CIR; will follow  2-essential HTN: fair control -continue atenolol and resume diuretics and ARB -hydralazine PRN ordered -follow BMET in am  3-AKI:  -gentle hydration given -Cr back to normal (1.09 on 3/13) -will monitor renal function -resume home antihypertensive agents  4-diabetes mellitus: continue SSI -A1C 7.8 -at discharge will start metformin BID  5-HLD: continue statins as mentioned above  6-residual left sided weakness: patient to be seen by CIR as recommended by PT -will monitor  DVT: lovenox  Code Status: Full Family Communication: daughter at bedside Disposition Plan: to be determine; PT recommending CIR; TEE on 3/14   Consultants:  Neurology   Cardiology for TEE/loop recorder  Procedures:  2-D echo pending: - Left ventricle: The cavity size was normal. There was moderate concentric hypertrophy. Systolic function was normal. The estimated ejection fraction was in the range of 55% to 60%. Wall motion was normal; there were no regional wall motion abnormalities. Doppler parameters are consistent with abnormal left ventricular relaxation (grade 1 diastolic  dysfunction).  Impressions: - No cardiac source of embolism was identified, but cannot be ruled out on the basis of this examination.   Carotid duplex: 1-39% ICA stenosis bilaterally, vertebral flow antegrade   TEE: preliminary info normal LV function; atrial septal aneurysm; negative saline microcavitation study.  Antibiotics:  None   HPI/Subjective: No CP, no SOB. Patient neurologic exam stable/unchanged.  Objective: Filed Vitals:   09/02/14 1307  BP: 144/70  Pulse: 70  Temp: 98.2 F (36.8 C)  Resp: 16    Intake/Output Summary (Last 24 hours) at 09/02/14 1506 Last data filed at 09/01/14 2059  Gross per 24 hour  Intake    120 ml  Output      0 ml  Net    120 ml   Filed Weights   08/30/14 1113  Weight: 85.276 kg (188 lb)    Exam:   General:  AAOX3, no CP, no SOB. Left side weakness unchanged   Cardiovascular: S1 and S2, no rubs or gallops; positive SEM  Respiratory: CTA bilaterally  Abdomen: soft, NT, ND, positive BS  Musculoskeletal: no edema or cyanosis   Neuro: left side weakness with MS 3/5, patient also reported paresthesia; normal speech and she is AAOX3   Data Reviewed: Basic Metabolic Panel:  Recent Labs Lab 08/30/14 1146 08/30/14 1705 09/01/14 0615  NA 139  --  138  K 4.0  --  3.5  CL 104  --  104  CO2 27  --  25  GLUCOSE 207*  --  154*  BUN 21  --  19  CREATININE 1.08 1.22* 1.09  CALCIUM 8.9  --  9.1  MG  --   --  2.0   Liver Function Tests:  Recent Labs Lab 08/30/14 1146  AST 22  ALT 13  ALKPHOS 62  BILITOT 0.4  PROT 7.3  ALBUMIN 3.6   CBC:  Recent Labs Lab 08/30/14 1146 08/30/14 1705  WBC 5.4 5.5  HGB 14.2 13.7  HCT 43.9 42.2  MCV 89.6 87.6  PLT 244 248   CBG:  Recent Labs Lab 09/01/14 1205 09/01/14 1630 09/01/14 2119 09/02/14 0657 09/02/14 1300  GLUCAP 167* 124* 152* 163* 124*   Studies: Ct Head Wo Contrast  09/01/2014   CLINICAL DATA:  Stroke, worsening speech, LEFT side neglect, dull RIGHT  side headache  EXAM: CT HEAD WITHOUT CONTRAST  TECHNIQUE: Contiguous axial images were obtained from the base of the skull through the vertex without intravenous contrast.  COMPARISON:  08/30/2014  FINDINGS: Normal ventricular morphology.  No midline shift or mass effect.  Old RIGHT frontal periventricular white matter at RIGHT caudate head infarcts.  RIGHT occipital and posterior temporal infarcts.  Small vessel chronic ischemic changes deep cerebral white matter.  No new areas of infarction or intracranial hemorrhage.  No mass lesion or extra-axial fluid collections.  Atherosclerotic calcification of carotid siphons.  Bones and sinuses unremarkable.  IMPRESSION: RIGHT occipital and posterior temporal infarcts.  Old RIGHT frontal periventricular white matter and RIGHT caudate head infarcts.  Small vessel chronic ischemic changes of deep cerebral white matter.  No significant interval change.   Electronically Signed   By: Lavonia Dana M.D.   On: 09/01/2014 09:40    Scheduled Meds: . aspirin  300 mg Rectal Daily   Or  . aspirin  325 mg Oral Daily  . atenolol  50 mg Oral Daily  . atorvastatin  20 mg Oral q1800  . enoxaparin (LOVENOX) injection  40 mg Subcutaneous Q24H  . insulin aspart  0-9 Units Subcutaneous TID AC & HS   Continuous Infusions: . sodium chloride 500 mL (09/02/14 1010)    Principal Problem:   Altered mental status Active Problems:   HYPERCHOLESTEROLEMIA, MILD   Essential hypertension   Type 2 diabetes mellitus with diabetic neuropathy   Acute ischemic right MCA stroke   Acute right PCA stroke   AKI (acute kidney injury)   Confusion    Time spent: 30 minutes   Barton Dubois  [facility] Pager [phone]. If 7PM-7AM, please contact night-coverage at [email or web address], password [facility] 09/02/2014, 3:06 PM  LOS: 2 days

## 2014-09-02 NOTE — Progress Notes (Signed)
  Echocardiogram Echocardiogram Transesophageal has been performed.  Philipp Deputy 09/02/2014, 1:04 PM

## 2014-09-02 NOTE — Consult Note (Signed)
Physical Medicine and Rehabilitation Consult Reason for Consult: Acute nonhemorrhagic right occipital lobe posterior inferior right temporal lobe anterior right frontal lobe Referring Physician: [facility]   HPI: Kayla [name] is a 74 y.o. right handed female with history of hypertension, diabetes mellitus and peripheral neuropathy, sickle cell trait and recent bronchitis. Independently with occasional cane prior to admission living with her husband. Admitted 08/30/2014 with altered mental status and left-sided weakness. MRI of the brain showed acute nonhemorrhagic infarct involving the right occipital lobe and posterior inferior right temporal lobe. MRA of the head showed high-grade tandem stenosis of the distal right M1 segment and associated proximal right M2 segment. Carotid Dopplers with no ICA stenosis. Patient did not receive TPA. Echocardiogram/TEE pending. Await plan for possible loop recorder implant. Neurology consulted presently on aspirin for CVA prophylaxis as well as subcutaneous Lovenox for DVT prophylaxis.   Review of Systems  Respiratory: Positive for cough.   Gastrointestinal: Positive for constipation.       GERD  Musculoskeletal: Positive for myalgias and joint pain.  Psychiatric/Behavioral:       Anxiety  All other systems reviewed and are negative.  Past Medical History  Diagnosis Date  . Asthmatic bronchitis   . Hypertension   . Venous insufficiency   . Diabetes mellitus   . Hypercholesteremia   . Diverticulosis of colon   . Constipation   . Prolapse of vaginal vault after hysterectomy   . Proteinuria   . DJD (degenerative joint disease)   . Fibromyalgia   . Somatic dysfunction   . Anxiety   . Personal history of noncompliance with medical treatment, presenting hazards to health   . Sickle-cell trait   . Allergy     Shell fish  . GERD (gastroesophageal reflux disease)    Past Surgical History  Procedure Laterality Date  . Abdominal hysterectomy      . Robotic assisted laparoscopic sacrocolpopexy  09/2009    Dr. Matilde Sprang  . Cataract extraction w/ intraocular lens  implant, bilateral  2012   Family History  Problem Relation Age of Onset  . Prostate cancer Father    Social History:  reports that she has never smoked. She has never used smokeless tobacco. She reports that she drinks alcohol. She reports that she does not use illicit drugs. Allergies:  Allergies  Allergen Reactions  . Fish Allergy Hives  . Shellfish Allergy Hives  . Penicillins     REACTION: red rash  . Tomato Rash   Medications Prior to Admission  Medication Sig Dispense Refill  . albuterol (PROVENTIL HFA;VENTOLIN HFA) 108 (90 BASE) MCG/ACT inhaler Inhale 2 puffs into the lungs every 6 (six) hours as needed for wheezing or shortness of breath.    [name] atenolol (TENORMIN) 50 MG tablet Take 1 tablet (50 mg total) by mouth daily. 3 tablet 6  . benzonatate (TESSALON) 100 MG capsule Take 1 capsule (100 mg total) by mouth 3 (three) times daily as needed for cough. 21 capsule 0  . Blood Glucose Monitoring Suppl (ACCU-CHEK AVIVA PLUS) W/DEVICE KIT Test blood sugar once daily 1 kit 0  . Diphenhyd-Hydrocort-Nystatin (FIRST-DUKES MOUTHWASH) SUSP 1 teaspoon by mouth gargle and swallow four times daily as needed 120 mL 1  . glucose blood (ACCU-CHEK AVIVA) test strip Check blood sugar once daily 100 each 4  . metFORMIN (GLUCOPHAGE) 500 MG tablet TAKE 1 TABLET DAILY WITH BREAKFAST 90 tablet 3  . olmesartan-hydrochlorothiazide (BENICAR HCT) 40-25 MG per tablet Take 1 tablet by  mouth daily.        Home: Home Living Family/patient expects to be discharged to:: Private residence Living Arrangements: Spouse/significant other Available Help at Discharge: Family Type of Home: House Home Access: Stairs to enter Technical brewer of Steps: 2 Entrance Stairs-Rails: Right, Left, Can reach both Home Layout: One level Home Equipment: Shower seat, Toilet riser  Functional  History: Prior Function Level of Independence: Independent with assistive device(s) (walking without cane usually recently) Functional Status:  Mobility: Bed Mobility Overal bed mobility: Needs Assistance Bed Mobility: Supine to Sit, Sit to Supine Rolling: Min assist Supine to sit: Supervision Sit to supine: Supervision General bed mobility comments: not assesed Transfers Overall transfer level: Needs assistance Equipment used: Rolling walker (2 wheeled), 1 person hand held assist Transfers: Sit to/from Stand Sit to Stand: Min guard Stand pivot transfers: Min assist General transfer comment: assist to control descent to bed.  Ambulation/Gait Ambulation/Gait assistance: Min assist Ambulation Distance (Feet): 8 Feet Assistive device: Rolling walker (2 wheeled), 1 person hand held assist Gait Pattern/deviations: Step-through pattern, Decreased dorsiflexion - right, Decreased dorsiflexion - left, Shuffle, Wide base of support (8' x 2) Gait velocity: reduced Gait velocity interpretation: Below normal speed for age/gender    ADL: ADL Overall ADL's : Needs assistance/impaired Eating/Feeding: Minimal assistance, Sitting Grooming: Oral care, Wash/dry face, Applying deodorant, Set up, Supervision/safety, Standing Upper Body Bathing: Minimal assitance, Sitting Lower Body Bathing: Min guard (standing at sink) Lower Body Bathing Details (indicate cue type and reason): washed peri area/buttocks Upper Body Dressing : Minimal assistance, Sitting Lower Body Dressing: Minimal assistance, Sit to/from stand Toilet Transfer: Min guard, Minimal assistance, Ambulation, RW (chair; also hand held assist) Toileting- Clothing Manipulation and Hygiene: Min guard (standing-pulled down briefs to wash bottom/peri area) Functional mobility during ADLs: Min guard, Minimal assistance, Rolling walker (walked with walker and hand held assist) General ADL Comments: Pt bumping into items on left side. Cues to  turn head to left. OT placed ADL items on left side at sink and with time, pt able to locate. Pt left water running at sink requiring cue from OT to turn off. Talked with pt's daughter at end about d/c recommendation for pt/OT treatment interventions.  Cognition: Cognition Overall Cognitive Status: Impaired/Different from baseline Arousal/Alertness: Awake/alert Orientation Level: Oriented X4 Attention: Alternating Alternating Attention: Impaired Memory:  (mild deficits in short-term recall) Awareness: Impaired Awareness Impairment: Intellectual impairment Problem Solving: Impaired Problem Solving Impairment: Verbal complex Safety/Judgment:  (tba) Cognition Arousal/Alertness: Awake/alert Behavior During Therapy: WFL for tasks assessed/performed Overall Cognitive Status: Impaired/Different from baseline Area of Impairment: Attention, Problem solving, Memory Current Attention Level: Sustained Memory: Decreased short-term memory Problem Solving: Slow processing General Comments: pt forgetting to turn off sink water.   Blood pressure 154/70, pulse 76, temperature 98.6 F (37 C), temperature source Oral, resp. rate 16, height $RemoveBe'5\' 6"'[id number]$  (1.676 m), weight 85.276 kg (188 lb), SpO2 97 %. Physical Exam  HENT:  Head: Normocephalic.  Eyes: EOM are normal.  Neck: Normal range of motion. Neck supple. No thyromegaly present.  Cardiovascular: Normal rate and regular rhythm.   Respiratory: Effort normal and breath sounds normal. No respiratory distress.  GI: Soft. Bowel sounds are normal. She exhibits no distension.  Neurological:  Mood is flat but appropriate. She makes good eye contact with examiner. She can provide her name, age and date of birth. Follows simple commands. Fair awareness of her deficits. LUE: 2+ to 3-/5 prox to distal. LLE: 2+ HF, 3- knee and 3+ ankle. Left central 7. speeech  slightly slurred  Skin: Skin is warm and dry.  Psychiatric: She has a normal mood and affect. Her behavior  is normal.    Results for orders placed or performed during the hospital encounter of 08/30/14 (from the past 24 hour(s))  Basic metabolic panel     Status: Abnormal   Collection Time: 09/01/14  6:15 AM  Result Value Ref Range   Sodium 138 135 - 145 mmol/L   Potassium 3.5 3.5 - 5.1 mmol/L   Chloride 104 96 - 112 mmol/L   CO2 25 19 - 32 mmol/L   Glucose, Bld 154 (H) 70 - 99 mg/dL   BUN 19 6 - 23 mg/dL   Creatinine, Ser 1.09 0.50 - 1.10 mg/dL   Calcium 9.1 8.4 - 10.5 mg/dL   GFR calc non Af Amer 49 (L) >90 mL/min   GFR calc Af Amer 57 (L) >90 mL/min   Anion gap 9 5 - 15  Magnesium     Status: None   Collection Time: 09/01/14  6:15 AM  Result Value Ref Range   Magnesium 2.0 1.5 - 2.5 mg/dL  Glucose, capillary     Status: Abnormal   Collection Time: 09/01/14  6:45 AM  Result Value Ref Range   Glucose-Capillary 156 (H) 70 - 99 mg/dL   Comment 1 Notify RN   Glucose, capillary     Status: Abnormal   Collection Time: 09/01/14 12:05 PM  Result Value Ref Range   Glucose-Capillary 167 (H) 70 - 99 mg/dL  Glucose, capillary     Status: Abnormal   Collection Time: 09/01/14  4:30 PM  Result Value Ref Range   Glucose-Capillary 124 (H) 70 - 99 mg/dL   Ct Head Wo Contrast  09/01/2014   CLINICAL DATA:  Stroke, worsening speech, LEFT side neglect, dull RIGHT side headache  EXAM: CT HEAD WITHOUT CONTRAST  TECHNIQUE: Contiguous axial images were obtained from the base of the skull through the vertex without intravenous contrast.  COMPARISON:  08/30/2014  FINDINGS: Normal ventricular morphology.  No midline shift or mass effect.  Old RIGHT frontal periventricular white matter at RIGHT caudate head infarcts.  RIGHT occipital and posterior temporal infarcts.  Small vessel chronic ischemic changes deep cerebral white matter.  No new areas of infarction or intracranial hemorrhage.  No mass lesion or extra-axial fluid collections.  Atherosclerotic calcification of carotid siphons.  Bones and sinuses  unremarkable.  IMPRESSION: RIGHT occipital and posterior temporal infarcts.  Old RIGHT frontal periventricular white matter and RIGHT caudate head infarcts.  Small vessel chronic ischemic changes of deep cerebral white matter.  No significant interval change.   Electronically Signed   By: Lavonia Dana M.D.   On: 09/01/2014 09:40    Assessment/Plan: Diagnosis: right brain infarcts with left hemiparesis 1. Does the need for close, 24 hr/day medical supervision in concert with the patient's rehab needs make it unreasonable for this patient to be served in a less intensive setting? Yes 2. Co-Morbidities requiring supervision/potential complications: dm2, aki 3. Due to bladder management, bowel management, safety, skin/wound care, disease management, medication administration, pain management and patient education, does the patient require 24 hr/day rehab nursing? Yes 4. Does the patient require coordinated care of a physician, rehab nurse, PT (1-2 hrs/day, 5 days/week), OT (1-2 hrs/day, 5 days/week) and SLP (1-2 hrs/day, 5 days/week) to address physical and functional deficits in the context of the above medical diagnosis(es)? Yes Addressing deficits in the following areas: balance, endurance, locomotion, strength, transferring, bowel/bladder control, bathing, dressing,  feeding, grooming, toileting, speech and psychosocial support 5. Can the patient actively participate in an intensive therapy program of at least 3 hrs of therapy per day at least 5 days per week? Yes 6. The potential for patient to make measurable gains while on inpatient rehab is excellent 7. Anticipated functional outcomes upon discharge from inpatient rehab are modified independent  with PT, modified independent with OT, modified independent with SLP. 8. Estimated rehab length of stay to reach the above functional goals is: 7-10 days 9. Does the patient have adequate social supports and living environment to accommodate these discharge  functional goals? Yes 10. Anticipated D/C setting: Home 11. Anticipated post D/C treatments: HH therapy and Outpatient therapy 12. Overall Rehab/Functional Prognosis: excellent  RECOMMENDATIONS: This patient's condition is appropriate for continued rehabilitative care in the following setting: CIR Patient has agreed to participate in recommended program. Yes Note that insurance prior authorization may be required for reimbursement for recommended care.  Comment: Rehab Admissions Coordinator to follow up.  Thanks,  Meredith Staggers, MD, Mellody Drown     09/02/2014

## 2014-09-02 NOTE — Progress Notes (Signed)
STROKE TEAM PROGRESS NOTE   HISTORY Kayla [name] is an 74 y.o. female history of hypertension, diabetes mellitus, hyperlipidemia, sickle cell trait and recent bronchitis, presenting with mental status changes with confusion as well as in intensity to bump into objects with walking and slight droop of left lower face. Patient was last known well on 08/19/2014. CT scan of her head showed an ill-defined hypodensity in the right parietal and occipital region. MRI showed acute infarction involving right occipital and posterior inferior temporal lobe, as well as acute infarction involving the anterior right frontal lobe. Moderate a showed right P2 segment occlusion, as well as right M1 and proximal M2 high-grade stenosis. NIH stroke score was 7.  LSN: 08/19/2014 tPA Given: No: Beyond time window for treatment consideration MRankin: 2   SUBJECTIVE (INTERVAL HISTORY) Daughter is at bedside. She just had TEE done and is going to have vascular testing. Slow in response, but no complains.    OBJECTIVE Temp:  [97.6 F (36.4 C)-98.6 F (37 C)] 98.6 F (37 C) (03/14 1812) Pulse Rate:  [66-76] 69 (03/14 1812) Cardiac Rhythm:  [-] Heart block (03/14 2000) Resp:  [12-27] 16 (03/14 1812) BP: (140-191)/(62-92) 151/74 mmHg (03/14 1812) SpO2:  [94 %-99 %] 98 % (03/14 1812)   Recent Labs Lab 09/01/14 1630 09/01/14 2119 09/02/14 0657 09/02/14 1300 09/02/14 1701  GLUCAP 124* 152* 163* 124* 137*    Recent Labs Lab 08/30/14 1146 08/30/14 1705 09/01/14 0615  NA 139  --  138  K 4.0  --  3.5  CL 104  --  104  CO2 27  --  25  GLUCOSE 207*  --  154*  BUN 21  --  19  CREATININE 1.08 1.22* 1.09  CALCIUM 8.9  --  9.1  MG  --   --  2.0    Recent Labs Lab 08/30/14 1146  AST 22  ALT 13  ALKPHOS 62  BILITOT 0.4  PROT 7.3  ALBUMIN 3.6    Recent Labs Lab 08/30/14 1146 08/30/14 1705  WBC 5.4 5.5  HGB 14.2 13.7  HCT 43.9 42.2  MCV 89.6 87.6  PLT 244 248   No results for input(s):  CKTOTAL, CKMB, CKMBINDEX, TROPONINI in the last 168 hours. No results for input(s): LABPROT, INR in the last 72 hours. No results for input(s): COLORURINE, LABSPEC, [place], GLUCOSEU, HGBUR, BILIRUBINUR, KETONESUR, PROTEINUR, UROBILINOGEN, NITRITE, LEUKOCYTESUR in the last 72 hours.  Invalid input(s): APPERANCEUR     Component Value Date/Time   CHOL 169 08/31/2014 0605   TRIG 124 08/31/2014 0605   HDL 34* 08/31/2014 0605   CHOLHDL 5.0 08/31/2014 0605   VLDL 25 08/31/2014 0605   LDLCALC 110* 08/31/2014 0605   Lab Results  Component Value Date   HGBA1C 7.8* 08/31/2014   No results found for: LABOPIA, COCAINSCRNUR, LABBENZ, AMPHETMU, THCU, LABBARB  No results for input(s): ETH in the last 168 hours.  I have personally reviewed the radiological images below and agree with the radiology interpretations.  Dg Chest 2 View 08/18/2014 1. No evidence of acute cardiopulmonary process. 2. Interval development of a nodular opacity in the lateral aspect of the left lung base on the frontal view. This finding is concerning for a pulmonary nodule and primary bronchogenic carcinoma. Recommend further evaluation with nonemergent CT scan of the chest. 3. Background bronchitic change and mild interstitial prominence suggests underlying COPD.  08/30/2014    No acute abnormality noted. The previously seen nodular density in the left base is not  well appreciated on this exam.    Ct Head Wo Contrast 08/30/2014    Chronic ischemic changes as above  Ill-defined hypodensity right occipital parietal lobe may represent subacute infarction. MRI recommended for further evaluation.  Negative for hemorrhage.     09/01/14 RIGHT occipital and posterior temporal infarcts. Old RIGHT frontal periventricular white matter and RIGHT caudate head infarcts. Small vessel chronic ischemic changes of deep cerebral white matter. No significant interval change.  MRI / MRA Brain Wo Contrast 08/30/2014    1. Acute  nonhemorrhagic infarcts involving the right occipital lobe and posterior inferior right temporal lobe.  2. The right P2 segment occlusion is associated with these infarcts.  3. Acute nonhemorrhagic infarcts involving the anterior right frontal lobe.  4. High-grade tandem stenoses of the distal right M1 segment and associated proximal right M2 segment.  5. Age advanced atrophy and diffuse white matter disease.  6. This corresponds with otherwise seen moderate distal small vessel disease.     CUS - Bilateral: 1-39% ICA stenosis. Vertebral artery flow is antegrade.  TEE - - Left ventricle: Hypertrophy was noted. Systolic function was normal. The estimated ejection fraction was in the range of 55% to 60%. Wall motion was normal; there were no regional wall motion abnormalities. - Aortic valve: No evidence of vegetation. - Mitral valve: No evidence of vegetation. There was mild regurgitation. - Left atrium: The atrium was mildly dilated. No evidence of thrombus in the atrial cavity or appendage. There was mild spontaneous echo contrast (&quot;smoke&quot;) in the cavity. - Right atrium: No evidence of thrombus in the atrial cavity or appendage. - Atrial septum: No defect or patent foramen ovale was identified. - Tricuspid valve: No evidence of vegetation.  Impressions: - Negative saline contrast echo. Nondiagnostic late bubbles noted possibly secondary to intrapulmonary shunt.  LE venous doppler - pending  Chest CT with contrast - pending   PHYSICAL EXAM  GENERAL: This is a very pleasant female in no acute distress.  HEENT: Unremarkable.  ABDOMEN: soft  EXTREMITIES: No edema   BACK:Unremarkable.  SKIN: Normal by inspection.    MENTAL STATUS: Alert, awke and oriented to self and month, but not to year, place and president. Speech, language and cognition are generally intact, however, significant psychomotor slowing.    CRANIAL NERVES:PERRL, EOMI, there is no  significant nystagmus; Visual field shows a dense left homonymous hemianopia; left lower facial muscles weakness; tongue is midline; uvula is midline; shoulder elevation is normal.  MOTOR: Right upper extremity shows normal tone, bulk and strength. Right leg slight weakness 4/5 hip flexion. Left upper extremity 4/5 with pronator drift and left lower extremity 3/5.  Sensory -  symmetrical bilaterally  COORDINATION: bilaterally FTN intact. HTS not cooperative.  Gait - not tested due to ancillary test.   ASSESSMENT/PLAN Kayla [name] is a 74 y.o. female with history of hypertension, diabetes mellitus, hyperlipidemia, sickle cell trait, presenting with altered mental status, left lower facial droop, and visual disturbance. She did not receive IV t-PA due to late presentation.   Stroke:  Multiple acute infarct involving the Right frontal lobe, temporal lobe and ccipital lobe, embolic pattern consistent with cardioembolic strokes.  Resultant  Left Homonymous hemianopia and left hemiparesis.  MRI  Right MCA and PCA strokes  MRA - right P2 occlusion, high-grade tandem stenoses of the distal right M1 and proximal right M2.   Carotid Doppler - unremarkable.  TEE no cardiac source of emboli, no PFO  Recommend loop recorder placement after chest  CT ruled out malignancy.  LDL 110, not at goal   HgbA1c 7.8, not at goal  Lovenox for VTE prophylaxis  Diet heart healthy/carb modified with thin liquids.  no antithrombotic prior to admission, now on aspirin 325 mg orally every day.  Ongoing aggressive stroke risk factor management  Therapy recommendations: CIR   Disposition: Pending  Hypertension  Home meds:  Atenolol 50 mg daily and Benicar with hydrochlorothiazide  Currently on atenolol, irbesartan  Stable   Hyperlipidemia  Home meds:  No lipid lowering medications prior to admission.  LDL 110 goal <70  Add Lipitor 20 mg daily  Continue statin at  discharge  Diabetes  HgbA1c 7.8, -goal < 7.0  Not in good control  SSI  DM education  Management as per primary team  Other Stroke Risk Factors  Advanced age  ETOH use  Obesity, Body mass index is 30.36 kg/(m^2).   Lung nodule  Initial CXR concerning for left lung nodule  Repeat CXR less concerning  follow-up CT of the chest scheduled by PCP for 09/06/2014.  Consider performing chest CT while inpatient.  Hospital day #   Rosalin Hawking, MD PhD Stroke Neurology 09/02/2014 10:11 PM     To contact Stroke Continuity provider, please refer to [email or web address] After hours, contact General Neurology

## 2014-09-02 NOTE — CV Procedure (Signed)
See full TEE report in [facility]; normal LV function; atrial septal aneurysm; negative saline microcavitation study. Kirk Ruths

## 2014-09-02 NOTE — Progress Notes (Addendum)
PT Cancellation Note  Patient Details Name: Kayla [name] MRN: [id number] DOB: 1940-07-30   Cancelled Treatment:    Reason Eval/Treat Not Completed: Patient at procedure or test/unavailable Pt currently in bathroom and getting wash up/vitals taken from tech. Will follow up next available time.  Pt off floor in PM on second attempt getting Doppler. Will follow up next available time.   Candy Sledge A 09/02/2014, 9:33 AM  Candy Sledge, PT, DPT [phone]

## 2014-09-02 NOTE — Progress Notes (Signed)
Rehab Admissions Coordinator Note:  Patient was screened by Cleatrice Burke for appropriateness for an Inpatient Acute Rehab Consult.  At this time, we await rehab consult completion today and I will follow up. Cleatrice Burke 09/02/2014, 8:35 AM  I can be reached at [phone].

## 2014-09-03 ENCOUNTER — Encounter (HOSPITAL_COMMUNITY): Payer: Self-pay | Admitting: Cardiology

## 2014-09-03 ENCOUNTER — Encounter (HOSPITAL_COMMUNITY)
Admission: EM | Disposition: A | Discharge status: Rehabilitation Facility | Payer: Self-pay | Source: Home / Self Care | Attending: Internal Medicine

## 2014-09-03 ENCOUNTER — Ambulatory Visit: Payer: Medicare HMO | Admitting: Internal Medicine

## 2014-09-03 DIAGNOSIS — I639 Cerebral infarction, unspecified: Secondary | ICD-10-CM

## 2014-09-03 HISTORY — PX: LOOP RECORDER IMPLANT: SHX5477

## 2014-09-03 LAB — GLUCOSE, CAPILLARY
GLUCOSE-CAPILLARY: 174 mg/dL — AB (ref 70–99)
Glucose-Capillary: 156 mg/dL — ABNORMAL HIGH (ref 70–99)
Glucose-Capillary: 157 mg/dL — ABNORMAL HIGH (ref 70–99)
Glucose-Capillary: 156 mg/dL — ABNORMAL HIGH (ref 70–99)

## 2014-09-03 LAB — BASIC METABOLIC PANEL
Anion gap: 8 (ref 5–15)
BUN: 19 mg/dL (ref 6–23)
CO2: 25 mmol/L (ref 19–32)
Calcium: 8.7 mg/dL (ref 8.4–10.5)
Chloride: 106 mmol/L (ref 96–112)
Creatinine, Ser: 1.01 mg/dL (ref 0.50–1.10)
GFR calc Af Amer: 62 mL/min — ABNORMAL LOW (ref >90)
GFR, EST NON AFRICAN AMERICAN: 53 mL/min — AB (ref >90)
GLUCOSE: 150 mg/dL — AB (ref 70–99)
Potassium: 4.2 mmol/L (ref 3.5–5.1)
Sodium: 139 mmol/L (ref 135–145)

## 2014-09-03 SURGERY — LOOP RECORDER IMPLANT

## 2014-09-03 MED ORDER — BISACODYL 10 MG RE SUPP
10.0000 mg | Freq: Once | RECTAL | Status: AC
  Administered 2014-09-04: 10 mg via RECTAL
  Filled 2014-09-03: qty 1

## 2014-09-03 MED ORDER — LIDOCAINE-EPINEPHRINE 1 %-1:100000 IJ SOLN
INTRAMUSCULAR | Status: AC
  Filled 2014-09-03: qty 1

## 2014-09-03 NOTE — CV Procedure (Signed)
SURGEON:  Thompson Grayer, MD     PREPROCEDURE DIAGNOSIS:  Cryptogenic Stroke    POSTPROCEDURE DIAGNOSIS:  Cryptogenic Stroke     PROCEDURES:   1. Implantable loop recorder implantation    INTRODUCTION:  Kayla [name] is a 74 y.o. female with a history of unexplained stroke who presents today for implantable loop implantation.  The patient has had a cryptogenic stroke.  Despite an extensive workup by neurology, no reversible causes have been identified.  she has worn telemetry during which she did not have arrhythmias.  There is significant concern for possible atrial fibrillation as the cause for the patients stroke.  The patient therefore presents today for implantable loop implantation.     DESCRIPTION OF PROCEDURE:  Informed written consent was obtained, and the patient was brought to the electrophysiology lab in a fasting state.  The patient required no sedation for the procedure today.  Mapping over the patient's chest was performed by the EP lab staff to identify the area where electrograms were most prominent for ILR recording.  This area was found to be the left parasternal region over the 3rd-4th intercostal space. The patients left chest was therefore prepped and draped in the usual sterile fashion by the EP lab staff. The skin overlying the left parasternal region was infiltrated with lidocaine for local analgesia.  A 0.5-cm incision was made over the left parasternal region over the 3rd intercostal space.  A subcutaneous ILR pocket was fashioned using a combination of sharp and blunt dissection.  A Med[employer]onic Reveal Linq model [id number] implantable loop recorder was then placed into the pocket  R waves were very prominent and measured 0.82mV. EBL<1 ml.  Steri- Strips and a sterile dressing were then applied.  There were no early apparent complications.     CONCLUSIONS:   1. Successful implantation of a Medtronic Reveal LINQ implantable loop recorder for cryptogenic stroke  2. No  early apparent complications.   Thompson Grayer MD 09/03/2014 10:11 AM

## 2014-09-03 NOTE — Progress Notes (Signed)
STROKE TEAM PROGRESS NOTE   HISTORY Kayla [name] is an 74 y.o. female history of hypertension, diabetes mellitus, hyperlipidemia, sickle cell trait and recent bronchitis, presenting with mental status changes with confusion as well as in intensity to bump into objects with walking and slight droop of left lower face. Patient was last known well on 08/19/2014. CT scan of her head showed an ill-defined hypodensity in the right parietal and occipital region. MRI showed acute infarction involving right occipital and posterior inferior temporal lobe, as well as acute infarction involving the anterior right frontal lobe. Moderate a showed right P2 segment occlusion, as well as right M1 and proximal M2 high-grade stenosis. NIH stroke score was 7.  LSN: 08/19/2014 tPA Given: No: Beyond time window for treatment consideration MRankin: 2   SUBJECTIVE (INTERVAL HISTORY) Family at the bedside. Loop placed.    OBJECTIVE Temp:  [97.7 F (36.5 C)-98.8 F (37.1 C)] 98.8 F (37.1 C) (03/15 1037) Pulse Rate:  [66-82] 82 (03/15 1037) Cardiac Rhythm:  [-] Heart block (03/14 2000) Resp:  [16-26] 16 (03/15 1037) BP: (135-167)/(62-86) 135/76 mmHg (03/15 1037) SpO2:  [95 %-100 %] 95 % (03/15 1037)   Recent Labs Lab 09/02/14 0657 09/02/14 1300 09/02/14 1701 09/02/14 2227 09/03/14 0708  GLUCAP 163* 124* 137* 133* 156*    Recent Labs Lab 08/30/14 1146 08/30/14 1705 09/01/14 0615 09/03/14 0615  NA 139  --  138 139  K 4.0  --  3.5 4.2  CL 104  --  104 106  CO2 27  --  25 25  GLUCOSE 207*  --  154* 150*  BUN 21  --  19 19  CREATININE 1.08 1.22* 1.09 1.01  CALCIUM 8.9  --  9.1 8.7  MG  --   --  2.0  --     Recent Labs Lab 08/30/14 1146  AST 22  ALT 13  ALKPHOS 62  BILITOT 0.4  PROT 7.3  ALBUMIN 3.6    Recent Labs Lab 08/30/14 1146 08/30/14 1705  WBC 5.4 5.5  HGB 14.2 13.7  HCT 43.9 42.2  MCV 89.6 87.6  PLT 244 248   No results for input(s): CKTOTAL, CKMB, CKMBINDEX,  TROPONINI in the last 168 hours. No results for input(s): LABPROT, INR in the last 72 hours. No results for input(s): COLORURINE, LABSPEC, [place], GLUCOSEU, HGBUR, BILIRUBINUR, KETONESUR, PROTEINUR, UROBILINOGEN, NITRITE, LEUKOCYTESUR in the last 72 hours.  Invalid input(s): APPERANCEUR     Component Value Date/Time   CHOL 169 08/31/2014 0605   TRIG 124 08/31/2014 0605   HDL 34* 08/31/2014 0605   CHOLHDL 5.0 08/31/2014 0605   VLDL 25 08/31/2014 0605   LDLCALC 110* 08/31/2014 0605   Lab Results  Component Value Date   HGBA1C 7.8* 08/31/2014   No results found for: LABOPIA, COCAINSCRNUR, LABBENZ, AMPHETMU, THCU, LABBARB  No results for input(s): ETH in the last 168 hours.   Dg Chest 2 View 08/18/2014 1. No evidence of acute cardiopulmonary process. 2. Interval development of a nodular opacity in the lateral aspect of the left lung base on the frontal view. This finding is concerning for a pulmonary nodule and primary bronchogenic carcinoma. Recommend further evaluation with nonemergent CT scan of the chest. 3. Background bronchitic change and mild interstitial prominence suggests underlying COPD.  08/30/2014    No acute abnormality noted. The previously seen nodular density in the left base is not well appreciated on this exam.    Ct Head Wo Contrast 08/30/2014    Chronic  ischemic changes as above  Ill-defined hypodensity right occipital parietal lobe may represent subacute infarction. MRI recommended for further evaluation.  Negative for hemorrhage.     09/01/14 RIGHT occipital and posterior temporal infarcts. Old RIGHT frontal periventricular white matter and RIGHT caudate head infarcts. Small vessel chronic ischemic changes of deep cerebral white matter. No significant interval change.  MRI / MRA Brain Wo Contrast 08/30/2014    1. Acute nonhemorrhagic infarcts involving the right occipital lobe and posterior inferior right temporal lobe.  2. The right P2 segment occlusion  is associated with these infarcts.  3. Acute nonhemorrhagic infarcts involving the anterior right frontal lobe.  4. High-grade tandem stenoses of the distal right M1 segment and associated proximal right M2 segment.  5. Age advanced atrophy and diffuse white matter disease.  6. This corresponds with otherwise seen moderate distal small vessel disease.     CUS - Bilateral: 1-39% ICA stenosis. Vertebral artery flow is antegrade.  TEE - - Left ventricle: Hypertrophy was noted. Systolic function was normal. The estimated ejection fraction was in the range of 55%to 60%. Wall motion was normal; there were no regional wallmotion abnormalities. - Aortic valve: No evidence of vegetation. - Mitral valve: No evidence of vegetation. There was mild regurgitation. - Left atrium: The atrium was mildly dilated. No evidence of thrombus in the atrial cavity or appendage. There was mild spontaneous echo contrast (&quot;smoke&quot;) in the cavity. - Right atrium: No evidence of thrombus in the atrial cavity or appendage. - Atrial septum: No defect or patent foramen ovale was identified. - Tricuspid valve: No evidence of vegetation. Impressions: - Negative saline contrast echo. Nondiagnostic late bubbles notedpossibly secondary to intrapulmonary shunt.  LE venous doppler no DVT   PHYSICAL EXAM GENERAL: This is a very pleasant female in no acute distress.  HEENT: Unremarkable.  ABDOMEN: soft  EXTREMITIES: No edema   BACK:Unremarkable.  SKIN: Normal by inspection.    MENTAL STATUS: Alert, awke and oriented to self and month, but not to year, place and president. Speech, language and cognition are generally intact, however, significant psychomotor slowing.    CRANIAL NERVES:PERRL, EOMI, there is no significant nystagmus; Visual field shows a dense left homonymous hemianopia; left lower facial muscles weakness; tongue is midline; uvula is midline; shoulder elevation is normal.  MOTOR:  Right upper extremity shows normal tone, bulk and strength. Right leg slight weakness 4/5 hip flexion. Left upper extremity 4/5 with pronator drift and left lower extremity 3/5.  Sensory -  symmetrical bilaterally  COORDINATION: bilaterally FTN intact. HTS not cooperative.  Gait - not tested due to ancillary test.   ASSESSMENT/PLAN Kayla [name] is a 74 y.o. female with history of hypertension, diabetes mellitus, hyperlipidemia, sickle cell trait, presenting with altered mental status, left lower facial droop, and visual disturbance. She did not receive IV t-PA due to late presentation.   Stroke:  Multiple acute infarct involving the Right frontal lobe, temporal lobe and occipital lobe, embolic pattern consistent with cardioembolic strokes.  Resultant  Left Homonymous hemianopia and left hemiparesis.  MRI  Right MCA and PCA strokes  MRA - right P2 occlusion, high-grade tandem stenoses of the distal right M1 and proximal right M2.   Carotid Doppler - unremarkable.  TEE no cardiac source of emboli, no PFO, +ASA  LE venous doppler negative  Loop recorder placement today  Lovenox for VTE prophylaxis  Diet heart healthy/carb modified with thin liquids.  no antithrombotic prior to admission, now on aspirin 325 mg orally  every day.  Ongoing aggressive stroke risk factor management  Therapy recommendations: CIR   Disposition: CIR today  Hypertension  Home meds:  Atenolol 50 mg daily and Benicar with hydrochlorothiazide  Currently on atenolol, irbesartan  Stable   Hyperlipidemia  Home meds:  No lipid lowering medications prior to admission.  LDL 110, not at goal <70  Added Lipitor 20 mg daily  Continue statin at discharge  Diabetes  HgbA1c 7.8, not at goal < 7.0  uncontrol  SSI  DM education  Management as per primary team  Other Stroke Risk Factors  Advanced age  ETOH use  Obesity, Body mass index is 30.36 kg/(m^2).   Lung nodule  Initial  CXR concerning for left lung nodule  Repeat CXR less concerning  Chest CT scheduled for 09/06/2014. Will not do as IP due to shellfish allergy and prolonged preparation. Repeat CXR done in the hospital does not show the nodule. consider rescheduling as an OP if needed -> daughter to call primary Jake Bathe w/ [facility]) and discuss if still needed.  Hospital day #   Rosalin Hawking, MD PhD Stroke Neurology 09/03/2014 8:40 PM   To contact Stroke Continuity provider, please refer to [email or web address] After hours, contact General Neurology

## 2014-09-03 NOTE — PMR Pre-admission (Signed)
PMR Admission Coordinator Pre-Admission Assessment  Patient: Kayla [name] is an 74 y.o., female MRN: [id number] DOB: December 22, 1940 Height: 5\' 6"  (167.6 cm) Weight: 85.276 kg (188 lb)              Insurance Information HMO: yes    PPO:      PCP:      IPA:      80/20:      OTHER: Medicare replacement policy PRIMARY: [employer] Medicare      Policy#: [id number]      Subscriber: pt CM Name: Asencion Partridge     Phone#: [phone]     Fax#: [phone] Pre-Cert#: [id number]      Employer: retired approved 3/17 until 3/21 with update due 3/22 Benefits:  Phone #: [phone]     Name: 09/02/14 Eff. Date: 06/21/14     Deduct: none      Out of Pocket Max: $4950      Life Max: none CIR: $285 per day copay days 1-6 then covers 100%      SNF: no copay days 1-20; $160 copay per day days 21-100 Outpatient: $40 copay per visit     Co-Pay: no visit limit Home Health: 100%      Co-Pay: none DME: 80%     Co-Pay: 20% Providers: in [place]  SECONDARY: none      Medicaid Application Date:       Case Manager:  Disability Application Date:       Case Worker:   Emergency Facilities manager Information    Name Relation Home Work Mobile   [place] Daughter [phone]     [place], [place] [phone]       Current Medical History  Patient Admitting Diagnosis: right brain infarct with left hemiparesis  History of Present Illness: Kayla [name] is a 74 y.o. right handed female with history of hypertension, diabetes mellitus and peripheral neuropathy, sickle cell trait and recent bronchitis. Independently with occasional cane prior to admission living with her husband.   Admitted 08/30/2014 with altered mental status and left-sided weakness. MRI of the brain showed acute nonhemorrhagic infarct involving the right occipital lobe and posterior inferior right temporal lobe. MRA of the head showed high-grade tandem stenosis of the distal right M1 segment and associated proximal right M2 segment. Carotid  Dopplers with no ICA stenosis. Patient did not receive TPA. Echocardiogram/TEE 3/14 not showing PFO or source of emboli. Loop recorder placed on 09/03/14. Neurology consulted presently on aspirin for CVA prophylaxis as well as subcutaneous Lovenox for DVT prophylaxis. LE dopplers negative.  CXR  3/11 did not show the nodule.  Previously seen nodular density in the left base. On 2/28 there was concern for a pulmonary nodule and primary bronchogenic carcinoma. CT of chest  Recommended and scheduled as an OP if needed on 09/06/2014.  Total: 7 NIH    Past Medical History  Past Medical History  Diagnosis Date  . Asthmatic bronchitis   . Hypertension   . Venous insufficiency   . Diabetes mellitus   . Hypercholesteremia   . Diverticulosis of colon   . Constipation   . Prolapse of vaginal vault after hysterectomy   . Proteinuria   . DJD (degenerative joint disease)   . Fibromyalgia   . Somatic dysfunction   . Anxiety   . Personal history of noncompliance with medical treatment, presenting hazards to health   . Sickle-cell trait   . Allergy     Shell fish  . GERD (gastroesophageal reflux disease)  Family History  family history includes Prostate cancer in her father.  Prior Rehab/Hospitalizations: none  Current Medications   Current facility-administered medications:  .  albuterol (PROVENTIL) (2.5 MG/3ML) 0.083% nebulizer solution 2.5 mg, 2.5 mg, Nebulization, Q6H PRN, Maryann Mikhail, DO .  aspirin suppository 300 mg, 300 mg, Rectal, Daily **OR** aspirin tablet 325 mg, 325 mg, Oral, Daily, Maryann Mikhail, DO, 325 mg at 09/05/14 0942 .  atenolol (TENORMIN) tablet 50 mg, 50 mg, Oral, Daily, Barton Dubois, MD, 50 mg at 09/05/14 [phone] .  atorvastatin (LIPITOR) tablet 20 mg, 20 mg, Oral, q1800, David L Rinehuls, PA-C, 20 mg at 09/04/14 1720 .  enoxaparin (LOVENOX) injection 40 mg, 40 mg, Subcutaneous, Q24H, Maryann Mikhail, DO, 40 mg at 09/04/14 1721 .  hydrALAZINE (APRESOLINE)  injection 10 mg, 10 mg, Intravenous, Q6H PRN, Maryann Mikhail, DO .  hydrochlorothiazide (HYDRODIURIL) tablet 25 mg, 25 mg, Oral, Daily, Barton Dubois, MD, 25 mg at 09/05/14 0942 .  insulin aspart (novoLOG) injection 0-9 Units, 0-9 Units, Subcutaneous, TID AC & HS, Maryann Mikhail, DO, 1 Units at 09/05/14 [phone] .  irbesartan (AVAPRO) tablet 300 mg, 300 mg, Oral, Daily, Barton Dubois, MD, 300 mg at 09/05/14 [phone] .  polyethylene glycol (MIRALAX / GLYCOLAX) packet 17 g, 17 g, Oral, Daily, Barton Dubois, MD, 17 g at 09/05/14 [phone] .  senna-docusate (Senokot-S) tablet 1 tablet, 1 tablet, Oral, BID, Barton Dubois, MD, 1 tablet at 09/05/14 2979  Patients Current Diet: Diet heart healthy/carb modified Diet - low sodium heart healthy with thin liquids  Precautions / Restrictions Precautions Precautions: Fall Restrictions Weight Bearing Restrictions: No   Prior Activity Level Mod I with a cane. Independent with adls. Drives. Lives with husband of 27 years. Cooks.  Home Assistive Devices / Equipment Home Assistive Devices/Equipment: Cane (specify quad or straight), Eyeglasses, Dentures (specify type), CBG Meter (upper dentures, straight cane) Home Equipment: Shower seat, Toilet riser  Prior Functional Level Prior Function Level of Independence: Independent with assistive device(s) (walking without cane usually recently)  Current Functional Level Cognition  Arousal/Alertness: Awake/alert Overall Cognitive Status: Impaired/Different from baseline Current Attention Level: Sustained Orientation Level: Oriented X4 (delayed responses) Safety/Judgement: Decreased awareness of safety, Decreased awareness of deficits General Comments: Patient more safe and focused on task this session. Had some L inattention this session. Patient able to use multiple resources to pathfind way back to room this session with some cueing and increased time.  Attention: Alternating Alternating Attention: Impaired Memory:   (mild deficits in short-term recall) Awareness: Impaired Awareness Impairment: Intellectual impairment Problem Solving: Impaired Problem Solving Impairment: Verbal complex Safety/Judgment:  (tba)    Extremity Assessment (includes Sensation/Coordination)  Upper Extremity Assessment: LUE deficits/detail LUE Deficits / Details: 3-/5 shoulder flexion LUE Sensation: decreased light touch LUE Coordination: decreased fine motor, decreased gross motor  Lower Extremity Assessment: Defer to PT evaluation LLE Deficits / Details: 3+ to 4- LLE strength    ADLs  Overall ADL's : Needs assistance/impaired Eating/Feeding: Minimal assistance, Sitting Grooming: Applying deodorant, Supervision/safety, Standing, Set up (deodorant at sink already) Grooming Details (indicate cue type and reason): pt able to open deodorant Upper Body Bathing: Supervision/ safety, Set up, Standing Lower Body Bathing: Set up, Supervison/ safety, Sit to/from stand Lower Body Bathing Details (indicate cue type and reason): washed peri area/buttocks Upper Body Dressing : Standing Upper Body Dressing Details (indicate cue type and reason): OT untied gown for pt to bathe; pt unable to tie gown back requiring assist Lower Body Dressing: Sit to/from stand, Minimal assistance Toilet  Transfer: Min guard, Ambulation, Comfort height toilet Toileting- Clothing Manipulation and Hygiene: Min guard, Sit to/from stand Functional mobility during ADLs: Min guard, Minimal assistance General ADL Comments: Encouraged pt to be using left hand. Educated on dressing technique. Pt took a little extra time to locate item on left side of sink. Ambulated in hallway and pt walking very fast with cues and assist to slow down. Pt distracted by other people in hallway.     Mobility  Overal bed mobility: Needs Assistance Bed Mobility: Supine to Sit Rolling: Min assist Supine to sit: Min assist Sit to supine: Supervision General bed mobility comments:  not assessed    Transfers  Overall transfer level: Needs assistance Equipment used: None Transfers: Sit to/from Stand Sit to Stand: Min guard Stand pivot transfers: Min assist General transfer comment: Min guard for safety.    Ambulation / Gait / Stairs / Wheelchair Mobility  Ambulation/Gait Ambulation/Gait assistance: Museum/gallery curator (Feet): 200 Feet (266ft) Assistive device: 1 person hand held assist Gait Pattern/deviations: Step-through pattern, Decreased stride length, Scissoring, Decreased dorsiflexion - left, Drifts right/left Gait velocity: sporatic Gait velocity interpretation: Below normal speed for age/gender General Gait Details: Patient continues with slight anterior translation of gait but much more controlled this session with cueing. Patient with decreased L foot clearance with fatigue and patient requiring cues to clear L foot with stepping.     Posture / Balance Balance Overall balance assessment: Needs assistance Sitting-balance support: Feet supported, No upper extremity supported Sitting balance-Leahy Scale: Fair Postural control: Posterior lean Standing balance support: During functional activity Standing balance-Leahy Scale: Fair Standing balance comment: LUE has been inconsistent on RW but with attending to the arm is more stable Standardized Balance Assessment Standardized Balance Assessment : Dynamic Gait Index Dynamic Gait Index Level Surface: Mild Impairment Change in Gait Speed: Mild Impairment Gait with Horizontal Head Turns: Mild Impairment Gait with Vertical Head Turns: Mild Impairment Gait and Pivot Turn: Moderate Impairment Step Over Obstacle: Severe Impairment    Special needs/care consideration                               Bowel mgmt: no BM since admission except very small 3/16 after a suppository Bladder mgmt: urgency New loop recorder placed 09/03/14 DM Hgb A1C 7.5 Mention of ETOH use as [name] factor in history    Previous Home Environment Living Arrangements: Spouse/significant other  Lives With: Spouse Available Help at Discharge: Family, Other (Comment) (dtr is very supportive) Type of Home: House Home Layout: One level Home Access: Stairs to enter Entrance Stairs-Rails: Right, Left, Can reach both Entrance Stairs-Number of Steps: 2 Bathroom Shower/Tub: Chiropodist: Standard Bathroom Accessibility: Yes How Accessible: Accessible via walker Home Care Services: No  Discharge Living Setting Plans for Discharge Living Setting: Patient's home, Lives with (comment), Other (Comment) (spouse) Type of Home at Discharge: House Discharge Home Layout: One level Discharge Home Access: Stairs to enter Entrance Stairs-Rails: Right, Left, Can reach both Entrance Stairs-Number of Steps: 2 Discharge Bathroom Shower/Tub: Tub/shower unit Discharge Bathroom Toilet: Standard Discharge Bathroom Accessibility: Yes How Accessible: Accessible via walker Does the patient have any problems obtaining your medications?: No  Social/Family/Support Systems Patient Roles: Spouse, Parent Contact Information: Herbie [place], spouse, and daughter, Olin Hauser Anticipated Caregiver: Olin Hauser and spouse Anticipated Ambulance person Information: see above Ability/Limitations of Caregiver: spouse supervsion , Daughter lives close and very supportive Caregiver Availability: 24/7 Discharge Plan Discussed with Primary Caregiver:  Yes Is Caregiver In Agreement with Plan?: Yes Does Caregiver/Family have Issues with Lodging/Transportation while Pt is in Rehab?: No  Goals/Additional Needs Patient/Family Goal for Rehab: Mod I to supervision with PT, OT, and SLP Expected length of stay: [facility] 7-10 days Pt/Family Agrees to Admission and willing to participate: Yes Program Orientation Provided & Reviewed with Pt/Caregiver Including Roles  & Responsibilities: Yes  Decrease burden of Care through IP rehab admission:  n/a  Possible need for SNF placement upon discharge:not anticipated  Patient Condition: This patient's medical and functional status has changed since the consult dated: 08/31/2014 in which the Rehabilitation Physician determined and documented that the patient's condition is appropriate for intensive rehabilitative care in an inpatient rehabilitation facility. See "History of Present Illness" (above) for medical update. Functional changes are: min to mod assist. Patient's medical and functional status update has been discussed with the Rehabilitation physician and patient remains appropriate for inpatient rehabilitation. Will admit to inpatient rehab today.  Preadmission Screen Completed By:  Cleatrice Burke, 09/05/2014 11:36 AM ______________________________________________________________________   Discussed status with Dr. Letta Pate on 09/05/2014 at  1117 and received telephone approval for admission today.  Admission Coordinator:  Cleatrice Burke, time 6754 Date 09/05/2014

## 2014-09-03 NOTE — H&P (View-Only) (Signed)
  ELECTROPHYSIOLOGY CONSULT NOTE  Patient ID: Kayla [name] MRN: [id number], DOB/AGE: 07/02/1940   Admit date: 08/30/2014 Date of Consult: 09/03/2014  Primary Physician: Kollar, Elizabeth A, MD Primary Cardiologist: new to [facility] Reason for Consultation: Cryptogenic stroke; recommendations regarding Implantable Loop Recorder  History of Present Illness Kayla [name] was admitted on 08/30/2014 with left facial droop.  Imaging demonstrated multiple acute infarcts consistent with cardioembolic stroke.  She has undergone workup for stroke including echocardiogram and carotid dopplers.  The patient has been monitored on telemetry which has demonstrated sinus rhythm with no arrhythmias. TEE demonstrated no cardiac source for thrombus  Echocardiogram this admission demonstrated EF 55-60%, grade 1 diastolic dysfunction, no RWMA, LA 27.  Lab work is reviewed.   Prior to admission, the patient denies chest pain, shortness of breath, dizziness, palpitations, or syncope.  They are recovering from their stroke with plans to go to inpatient rehab at discharge.   EP has been asked to evaluate for placement of an implantable loop recorder to monitor for atrial fibrillation.  ROS is negative except as outlined above.    Past Medical History  Diagnosis Date  . Asthmatic bronchitis   . Hypertension   . Venous insufficiency   . Diabetes mellitus   . Hypercholesteremia   . Diverticulosis of colon   . Constipation   . Prolapse of vaginal vault after hysterectomy   . Proteinuria   . DJD (degenerative joint disease)   . Fibromyalgia   . Somatic dysfunction   . Anxiety   . Personal history of noncompliance with medical treatment, presenting hazards to health   . Sickle-cell trait   . Allergy     Shell fish  . GERD (gastroesophageal reflux disease)      Surgical History:  Past Surgical History  Procedure Laterality Date  . Abdominal hysterectomy    . Robotic assisted laparoscopic  sacrocolpopexy  09/2009    Dr. MacDiarmid  . Cataract extraction w/ intraocular lens  implant, bilateral  2012  . Tee without cardioversion N/A 09/02/2014    Procedure: TRANSESOPHAGEAL ECHOCARDIOGRAM (TEE);  Surgeon: Brian S Crenshaw, MD;  Location: [facility];  Service: Cardiovascular;  Laterality: N/A;     Prescriptions prior to admission  Medication Sig Dispense Refill Last Dose  . albuterol (PROVENTIL HFA;VENTOLIN HFA) 108 (90 BASE) MCG/ACT inhaler Inhale 2 puffs into the lungs every 6 (six) hours as needed for wheezing or shortness of breath.   Past Month at Unknown time  . atenolol (TENORMIN) 50 MG tablet Take 1 tablet (50 mg total) by mouth daily. 3 tablet 6 08/29/2014 at Unknown time  . benzonatate (TESSALON) 100 MG capsule Take 1 capsule (100 mg total) by mouth 3 (three) times daily as needed for cough. 21 capsule 0 unknown  . Blood Glucose Monitoring Suppl (ACCU-CHEK AVIVA PLUS) W/DEVICE KIT Test blood sugar once daily 1 kit 0 unknown  . Diphenhyd-Hydrocort-Nystatin (FIRST-DUKES MOUTHWASH) SUSP 1 teaspoon by mouth gargle and swallow four times daily as needed 120 mL 1 unknown  . glucose blood (ACCU-CHEK AVIVA) test strip Check blood sugar once daily 100 each 4 unknown  . metFORMIN (GLUCOPHAGE) 500 MG tablet TAKE 1 TABLET DAILY WITH BREAKFAST 90 tablet 3 08/30/2014 at Unknown time  . olmesartan-hydrochlorothiazide (BENICAR HCT) 40-25 MG per tablet Take 1 tablet by mouth daily.     08/30/2014 at Unknown time    Inpatient Medications:  . aspirin  300 mg Rectal Daily   Or  . aspirin  325 mg   Oral Daily  . atenolol  50 mg Oral Daily  . atorvastatin  20 mg Oral q1800  . enoxaparin (LOVENOX) injection  40 mg Subcutaneous Q24H  . hydrochlorothiazide  25 mg Oral Daily  . insulin aspart  0-9 Units Subcutaneous TID AC & HS  . irbesartan  300 mg Oral Daily  . polyethylene glycol  17 g Oral Daily  . senna-docusate  1 tablet Oral BID    Allergies:  Allergies  Allergen Reactions  . Fish  Allergy Hives  . Shellfish Allergy Hives  . Penicillins     REACTION: red rash  . Tomato Rash    History   Social History  . Marital Status: Married    Spouse Name: N/A  . Number of Children: N/A  . Years of Education: N/A   Occupational History  . Not on file.   Social History Main Topics  . Smoking status: Never Smoker   . Smokeless tobacco: Never Used  . Alcohol Use: Yes     Comment: rarely wine  . Drug Use: No  . Sexual Activity: No   Other Topics Concern  . Not on file   Social History Narrative     Family History  Problem Relation Age of Onset  . Prostate cancer Father      Physical Exam: Filed Vitals:   09/02/14 1812 09/02/14 2230 09/03/14 0208 09/03/14 0539  BP: 151/74 162/77 150/83 158/72  Pulse: 69 69 72 75  Temp: 98.6 F (37 C) 98.2 F (36.8 C) 97.7 F (36.5 C) 97.9 F (36.6 C)  TempSrc: Oral Oral Oral Oral  Resp: _0 Height:      Weight:      SpO2: 98% 96% 99% 100%    GEN- The patient is well appearing, alert and oriented x 3 today.   Head- normocephalic, atraumatic Eyes-  Sclera clear, conjunctiva pink Ears- hearing intact Oropharynx- clear Neck- supple, Lungs- Clear to ausculation bilaterally, normal work of breathing Heart- Regular rate and rhythm, no murmurs, rubs or gallops, PMI not laterally displaced GI- soft, NT, ND, + BS Extremities- no clubbing, cyanosis, or edema MS- no significant deformity or atrophy Skin- no rash or lesion Psych- euthymic mood, full affect   Labs:   Lab Results  Component Value Date   WBC 5.5 08/30/2014   HGB 13.7 08/30/2014   HCT 42.2 08/30/2014   MCV 87.6 08/30/2014   PLT 248 08/30/2014    Recent Labs Lab 08/30/14 1146  09/03/14 0615  NA 139  < > 139  K 4.0  < > 4.2  CL 104  < > 106  CO2 27  < > 25  BUN 21  < > 19  CREATININE 1.08  < > 1.01  CALCIUM 8.9  < > 8.7  PROT 7.3  --   --   BILITOT 0.4  --   --   ALKPHOS 62  --   --   ALT 13  --   --   AST 22  --   --   GLUCOSE  207*  < > 150*  < > = values in this interval not displayed. No results found for: CKTOTAL, CKMB, CKMBINDEX, TROPONINI   Radiology/Studies: Dg Chest 2 View 08/30/2014   CLINICAL DATA:  Altered mental status, possible lung nodule  EXAM: CHEST  2 VIEW  COMPARISON:  08/18/2014  FINDINGS: Cardiac shadow is within normal limits. The lungs are well aerated bilaterally. The previously seen nodular density is less  well appreciated on the current exam. No new focal infiltrate is seen. No new bony abnormality is noted.  IMPRESSION: No acute abnormality noted. The previously seen nodular density in the left base is not well appreciated on this exam.   Electronically Signed   By: Inez Catalina M.D.   On: 08/30/2014 14:32   Ct Head Wo Contrast 09/01/2014   CLINICAL DATA:  Stroke, worsening speech, LEFT side neglect, dull RIGHT side headache  EXAM: CT HEAD WITHOUT CONTRAST  TECHNIQUE: Contiguous axial images were obtained from the base of the skull through the vertex without intravenous contrast.  COMPARISON:  08/30/2014  FINDINGS: Normal ventricular morphology.  No midline shift or mass effect.  Old RIGHT frontal periventricular white matter at RIGHT caudate head infarcts.  RIGHT occipital and posterior temporal infarcts.  Small vessel chronic ischemic changes deep cerebral white matter.  No new areas of infarction or intracranial hemorrhage.  No mass lesion or extra-axial fluid collections.  Atherosclerotic calcification of carotid siphons.  Bones and sinuses unremarkable.  IMPRESSION: RIGHT occipital and posterior temporal infarcts.  Old RIGHT frontal periventricular white matter and RIGHT caudate head infarcts.  Small vessel chronic ischemic changes of deep cerebral white matter.  No significant interval change.   Electronically Signed   By: Lavonia Dana M.D.   On: 09/01/2014 09:40    12-lead ECG sinus rhythm, rate 73, normal intervals  All prior EKG's in EPIC reviewed with no documented atrial fibrillation  Telemetry  sinus rhythm with intermittent bundle branch block  Assessment and Plan:  1. Cryptogenic stroke The patient presents with cryptogenic stroke.  I spoke at length with the patient about monitoring for afib with an implantable loop recorder.  Risks, benefits, and alteratives to implantable loop recorder were discussed with the patient today.   At this time, the patient is very clear in their decision to proceed with implantable loop recorder.   Wound care was reviewed with the patient (keep incision clean and dry for 3 days).  Wound check scheduled for 09-16-14 at 2:30PM.   Please call with questions.   Chanetta Marshall, NP 09/03/2014 8:24 AM   I have seen, examined the patient, and reviewed the above assessment and plan.  On exam, RRR. Changes to above are made where necessary.  I have spoken at length with daughter, spouse, and patient about ILR vs 30 day monitor.  They are clear that they would like to proceed with ILR placement at this time.  Co Sign: Thompson Grayer, MD 09/03/2014 9:28 AM

## 2014-09-03 NOTE — Progress Notes (Signed)
Physical Therapy Treatment Patient Details Name: SOUA LENK MRN: [id number] DOB: [birth year] Today's Date: 09/03/2014    History of Present Illness 74 yo female with onset of  L side weakness was diagnosed with acute infarcts of R occipital and posterior inferior temp lobe and ant R frontal lobe     PT Comments    Patient motivated and progressing well. Still with mild decreased safety and impulsiveness but continues to progress. Able to work on dynamic balance today. Continue to recommend comprehensive inpatient rehab (CIR) for post-acute therapy needs.   Follow Up Recommendations  CIR;Supervision/Assistance - 24 hour     Equipment Recommendations       Recommendations for Other Services       Precautions / Restrictions Precautions Precautions: Fall    Mobility  Bed Mobility Overal bed mobility: Needs Assistance Bed Mobility: Supine to Sit     Supine to sit: Min assist     General bed mobility comments: Use of rails and min A to power up from L side of bed  Transfers Overall transfer level: Needs assistance Equipment used: None   Sit to Stand: Min guard;Min assist         General transfer comment: Min A initally to stand then MG x3 to stand from various surfaces  Ambulation/Gait Ambulation/Gait assistance: Min assist Ambulation Distance (Feet): 200 Feet Assistive device: 1 person hand held assist Gait Pattern/deviations: Step-through pattern;Decreased stride length;Scissoring;Decreased dorsiflexion - left;Drifts right/left Gait velocity: sporatic   General Gait Details: Patient continues with anterior translation of gait but able to control more with cues.    Stairs            Wheelchair Mobility    Modified Rankin (Stroke Patients Only) Modified Rankin (Stroke Patients Only) Pre-Morbid Rankin Score: Slight disability Modified Rankin: Moderately severe disability     Balance     Sitting balance-Leahy Scale: Fair       Standing  balance-Leahy Scale: Fair                      Cognition Arousal/Alertness: Awake/alert Behavior During Therapy: WFL for tasks assessed/performed Overall Cognitive Status: Impaired/Different from baseline Area of Impairment: Problem solving;Safety/judgement     Memory: Decreased short-term memory   Safety/Judgement: Decreased awareness of safety   Problem Solving: Slow processing;Requires verbal cues General Comments: easily distracted but appears a tad more focused this session.     Exercises      General Comments        Pertinent Vitals/Pain Pain Assessment: No/denies pain    Home Living     Available Help at Discharge: Family;Other (Comment) (dtr is very supportive)                Prior Function            PT Goals (current goals can now be found in the care plan section) Progress towards PT goals: Progressing toward goals    Frequency  Min 4X/week    PT Plan Current plan remains appropriate    Co-evaluation             End of Session Equipment Utilized During Treatment: Gait belt Activity Tolerance: Patient tolerated treatment well Patient left: in bed;with call bell/phone within reach     Time: 1416-1439 PT Time Calculation (min) (ACUTE ONLY): 23 min  Charges:  $Gait Training: 23-37 mins  G Codes:      Jacqualyn Posey 09/03/2014, 3:28 PM  09/03/2014 Jacqualyn Posey PTA [phone] pager [phone] office

## 2014-09-03 NOTE — Progress Notes (Signed)
PT Cancellation Note  Patient Details Name: Kayla [name] MRN: [id number] DOB: 05-16-1941   Cancelled Treatment:    Reason Eval/Treat Not Completed: Patient at procedure or test/unavailable. Patient down for loop recorder placement at this time. Will follow up as able.    Jacqualyn Posey 09/03/2014, 9:58 AM

## 2014-09-03 NOTE — Interval H&P Note (Signed)
History and Physical Interval Note:  09/03/2014 9:29 AM  Kayla [name]  has presented today for surgery, with the diagnosis of stroke  The various methods of treatment have been discussed with the patient and family. After consideration of risks, benefits and other options for treatment, the patient has consented to  Procedure(s): LOOP RECORDER IMPLANT (N/A) as a surgical intervention .  The patient's history has been reviewed, patient examined, no change in status, stable for surgery.  I have reviewed the patient's chart and labs.  Questions were answered to the patient's satisfaction.     Thompson Grayer

## 2014-09-03 NOTE — Progress Notes (Signed)
[facility] PROGRESS NOTE  Kayla [name] OAC:[id number] DOB: [birth year] DOA: 08/30/2014 PCP: Olga Millers, MD  Assessment/Plan: 1-multiple acute/subacute ischemic stroke: patient presented with left side weakness and confusion -MRI/MRA with acute infarcts involving the right frontal lobe in a watershed distribution, right medial temporal lobe and right occipital lobe. The combination of findings is highly suspicious for cardioembolic phenomenon. -carotid duplex: bilateral ica stenosis 1-39%; vertebral flow antegrade  -2-D echo: no source of emboli, preserved EF -TEE not showing PFO or source of emboli; loop recorder implanted -continue ASA for secondary prevention -patient with LDL 110 (goal is < 70): will continue lipitor -PT/OT: recommending CIR; will follow  2-essential HTN: fair control -continue atenolol, HCTZ and ARB -hydralazine PRN ordered -follow renal function  3-AKI:  -gentle hydration given -Cr back to normal (1.01 on 3/15) -will monitor renal function, now that we have resume home antihypertensive agents  4-diabetes mellitus: continue SSI -A1C 7.8 -at discharge will recommend initiation of metformin BID  5-HLD: continue statins as mentioned above  6-residual left sided weakness: patient to be seen by CIR as recommended by PT -will monitor  7-lung nodule: patient with planned CT chest as an outpatient -repeat CXR during this admission not showing nodules. -further decision and workup to be arranged by PCP  DVT: lovenox  Code Status: Full Family Communication: daughter at bedside Disposition Plan: to be determine; PT recommending CIR; TEE on 3/14   Consultants:  Neurology   Cardiology for TEE/loop recorder  Procedures:  2-D echo pending: - Left ventricle: The cavity size was normal. There was moderate concentric hypertrophy. Systolic function was normal. The estimated ejection fraction was in the range of 55% to 60%. Wall motion  was normal; there were no regional wall motion abnormalities. Doppler parameters are consistent with abnormal left ventricular relaxation (grade 1 diastolic dysfunction).  Impressions: - No cardiac source of embolism was identified, but cannot be ruled out on the basis of this examination.   Carotid duplex: 1-39% ICA stenosis bilaterally, vertebral flow antegrade   TEE: preliminary info normal LV function; atrial septal aneurysm; negative saline microcavitation study.   Loop Recorder implantation 3/15  Antibiotics:  None   HPI/Subjective: No CP, no SOB. Afebrile. No new focal deficit. Mentation slightly off from yesterday exam (3/14), ?? Sundowning   Objective: Filed Vitals:   09/03/14 2154  BP: 174/88  Pulse: 70  Temp: 97.9 F (36.6 C)  Resp:    No intake or output data in the 24 hours ending 09/03/14 2256 Filed Weights   08/30/14 1113  Weight: 85.276 kg (188 lb)    Exam:   General:  AAOX2, no CP, no SOB. Left side weakness unchanged   Cardiovascular: S1 and S2, no rubs or gallops; positive SEM  Respiratory: CTA bilaterally  Abdomen: soft, NT, ND, positive BS  Musculoskeletal: no edema or cyanosis   Neuro: left side weakness with MS 3/5, patient also reported paresthesia; normal speech and she is AAOX2 (slight fluctuation in mentation today; ??sundowning)    Data Reviewed: Basic Metabolic Panel:  Recent Labs Lab 08/30/14 1146 08/30/14 1705 09/01/14 0615 09/03/14 0615  NA 139  --  138 139  K 4.0  --  3.5 4.2  CL 104  --  104 106  CO2 27  --  25 25  GLUCOSE 207*  --  154* 150*  BUN 21  --  19 19  CREATININE 1.08 1.22* 1.09 1.01  CALCIUM 8.9  --  9.1 8.7  MG  --   --  2.0  --    Liver Function Tests:  Recent Labs Lab 08/30/14 1146  AST 22  ALT 13  ALKPHOS 62  BILITOT 0.4  PROT 7.3  ALBUMIN 3.6   CBC:  Recent Labs Lab 08/30/14 1146 08/30/14 1705  WBC 5.4 5.5  HGB 14.2 13.7  HCT 43.9 42.2  MCV 89.6 87.6  PLT 244 248    CBG:  Recent Labs Lab 09/02/14 2227 09/03/14 0708 09/03/14 1154 09/03/14 1618 09/03/14 2152  GLUCAP 133* 156* 174* 157* 156*   Studies: No results found.  Scheduled Meds: . aspirin  300 mg Rectal Daily   Or  . aspirin  325 mg Oral Daily  . atenolol  50 mg Oral Daily  . atorvastatin  20 mg Oral q1800  . bisacodyl  10 mg Rectal Once  . enoxaparin (LOVENOX) injection  40 mg Subcutaneous Q24H  . hydrochlorothiazide  25 mg Oral Daily  . insulin aspart  0-9 Units Subcutaneous TID AC & HS  . irbesartan  300 mg Oral Daily  . polyethylene glycol  17 g Oral Daily  . senna-docusate  1 tablet Oral BID   Continuous Infusions:    Principal Problem:   Altered mental status Active Problems:   HYPERCHOLESTEROLEMIA, MILD   Essential hypertension   Type 2 diabetes mellitus with diabetic neuropathy   Acute ischemic right MCA stroke   Acute right PCA stroke   AKI (acute kidney injury)   Confusion    Time spent: 73 minutes   Barton Dubois  [facility] Pager [phone]. If 7PM-7AM, please contact night-coverage at [email or web address], password [facility] 09/03/2014, 10:56 PM  LOS: 3 days

## 2014-09-03 NOTE — Consult Note (Signed)
ELECTROPHYSIOLOGY CONSULT NOTE  Patient ID: Kayla [name] MRN: [id number], DOB/AGE: 75-Oct-1942   Admit date: 08/30/2014 Date of Consult: 09/03/2014  Primary Physician: Olga Millers, MD Primary Cardiologist: new to [facility] Reason for Consultation: Cryptogenic stroke; recommendations regarding Implantable Loop Recorder  History of Present Illness Kayla [name] was admitted on 08/30/2014 with left facial droop.  Imaging demonstrated multiple acute infarcts consistent with cardioembolic stroke.  She has undergone workup for stroke including echocardiogram and carotid dopplers.  The patient has been monitored on telemetry which has demonstrated sinus rhythm with no arrhythmias. TEE demonstrated no cardiac source for thrombus  Echocardiogram this admission demonstrated EF 16-54%, grade 1 diastolic dysfunction, no RWMA, LA 27.  Lab work is reviewed.   Prior to admission, the patient denies chest pain, shortness of breath, dizziness, palpitations, or syncope.  They are recovering from their stroke with plans to go to inpatient rehab at discharge.   EP has been asked to evaluate for placement of an implantable loop recorder to monitor for atrial fibrillation.  ROS is negative except as outlined above.    Past Medical History  Diagnosis Date  . Asthmatic bronchitis   . Hypertension   . Venous insufficiency   . Diabetes mellitus   . Hypercholesteremia   . Diverticulosis of colon   . Constipation   . Prolapse of vaginal vault after hysterectomy   . Proteinuria   . DJD (degenerative joint disease)   . Fibromyalgia   . Somatic dysfunction   . Anxiety   . Personal history of noncompliance with medical treatment, presenting hazards to health   . Sickle-cell trait   . Allergy     Shell fish  . GERD (gastroesophageal reflux disease)      Surgical History:  Past Surgical History  Procedure Laterality Date  . Abdominal hysterectomy    . Robotic assisted laparoscopic  sacrocolpopexy  09/2009    Dr. Matilde Sprang  . Cataract extraction w/ intraocular lens  implant, bilateral  2012  . Tee without cardioversion N/A 09/02/2014    Procedure: TRANSESOPHAGEAL ECHOCARDIOGRAM (TEE);  Surgeon: Lelon Perla, MD;  Location: [facility] ENDOSCOPY;  Service: Cardiovascular;  Laterality: N/A;     Prescriptions prior to admission  Medication Sig Dispense Refill Last Dose  . albuterol (PROVENTIL HFA;VENTOLIN HFA) 108 (90 BASE) MCG/ACT inhaler Inhale 2 puffs into the lungs every 6 (six) hours as needed for wheezing or shortness of breath.   Past Month at Unknown time  . atenolol (TENORMIN) 50 MG tablet Take 1 tablet (50 mg total) by mouth daily. 3 tablet 6 08/29/2014 at Unknown time  . benzonatate (TESSALON) 100 MG capsule Take 1 capsule (100 mg total) by mouth 3 (three) times daily as needed for cough. 21 capsule 0 unknown  . Blood Glucose Monitoring Suppl (ACCU-CHEK AVIVA PLUS) W/DEVICE KIT Test blood sugar once daily 1 kit 0 unknown  . Diphenhyd-Hydrocort-Nystatin (FIRST-DUKES MOUTHWASH) SUSP 1 teaspoon by mouth gargle and swallow four times daily as needed 120 mL 1 unknown  . glucose blood (ACCU-CHEK AVIVA) test strip Check blood sugar once daily 100 each 4 unknown  . metFORMIN (GLUCOPHAGE) 500 MG tablet TAKE 1 TABLET DAILY WITH BREAKFAST 90 tablet 3 08/30/2014 at Unknown time  . olmesartan-hydrochlorothiazide (BENICAR HCT) 40-25 MG per tablet Take 1 tablet by mouth daily.     08/30/2014 at Unknown time    Inpatient Medications:  . aspirin  300 mg Rectal Daily   Or  . aspirin  325 mg  Oral Daily  . atenolol  50 mg Oral Daily  . atorvastatin  20 mg Oral q1800  . enoxaparin (LOVENOX) injection  40 mg Subcutaneous Q24H  . hydrochlorothiazide  25 mg Oral Daily  . insulin aspart  0-9 Units Subcutaneous TID AC & HS  . irbesartan  300 mg Oral Daily  . polyethylene glycol  17 g Oral Daily  . senna-docusate  1 tablet Oral BID    Allergies:  Allergies  Allergen Reactions  . Fish  Allergy Hives  . Shellfish Allergy Hives  . Penicillins     REACTION: red rash  . Tomato Rash    History   Social History  . Marital Status: Married    Spouse Name: N/A  . Number of Children: N/A  . Years of Education: N/A   Occupational History  . Not on file.   Social History Main Topics  . Smoking status: Never Smoker   . Smokeless tobacco: Never Used  . Alcohol Use: Yes     Comment: rarely wine  . Drug Use: No  . Sexual Activity: No   Other Topics Concern  . Not on file   Social History Narrative     Family History  Problem Relation Age of Onset  . Prostate cancer Father      Physical Exam: Filed Vitals:   09/02/14 1812 09/02/14 2230 09/03/14 0208 09/03/14 0539  BP: 151/74 162/77 150/83 158/72  Pulse: 69 69 72 75  Temp: 98.6 F (37 C) 98.2 F (36.8 C) 97.7 F (36.5 C) 97.9 F (36.6 C)  TempSrc: Oral Oral Oral Oral  Resp: _0 Height:      Weight:      SpO2: 98% 96% 99% 100%    GEN- The patient is well appearing, alert and oriented x 3 today.   Head- normocephalic, atraumatic Eyes-  Sclera clear, conjunctiva pink Ears- hearing intact Oropharynx- clear Neck- supple, Lungs- Clear to ausculation bilaterally, normal work of breathing Heart- Regular rate and rhythm, no murmurs, rubs or gallops, PMI not laterally displaced GI- soft, NT, ND, + BS Extremities- no clubbing, cyanosis, or edema MS- no significant deformity or atrophy Skin- no rash or lesion Psych- euthymic mood, full affect   Labs:   Lab Results  Component Value Date   WBC 5.5 08/30/2014   HGB 13.7 08/30/2014   HCT 42.2 08/30/2014   MCV 87.6 08/30/2014   PLT 248 08/30/2014    Recent Labs Lab 08/30/14 1146  09/03/14 0615  NA 139  < > 139  K 4.0  < > 4.2  CL 104  < > 106  CO2 27  < > 25  BUN 21  < > 19  CREATININE 1.08  < > 1.01  CALCIUM 8.9  < > 8.7  PROT 7.3  --   --   BILITOT 0.4  --   --   ALKPHOS 62  --   --   ALT 13  --   --   AST 22  --   --   GLUCOSE  207*  < > 150*  < > = values in this interval not displayed. No results found for: CKTOTAL, CKMB, CKMBINDEX, TROPONINI   Radiology/Studies: Dg Chest 2 View 08/30/2014   CLINICAL DATA:  Altered mental status, possible lung nodule  EXAM: CHEST  2 VIEW  COMPARISON:  08/18/2014  FINDINGS: Cardiac shadow is within normal limits. The lungs are well aerated bilaterally. The previously seen nodular density is less  well appreciated on the current exam. No new focal infiltrate is seen. No new bony abnormality is noted.  IMPRESSION: No acute abnormality noted. The previously seen nodular density in the left base is not well appreciated on this exam.   Electronically Signed   By: Inez Catalina M.D.   On: 08/30/2014 14:32   Ct Head Wo Contrast 09/01/2014   CLINICAL DATA:  Stroke, worsening speech, LEFT side neglect, dull RIGHT side headache  EXAM: CT HEAD WITHOUT CONTRAST  TECHNIQUE: Contiguous axial images were obtained from the base of the skull through the vertex without intravenous contrast.  COMPARISON:  08/30/2014  FINDINGS: Normal ventricular morphology.  No midline shift or mass effect.  Old RIGHT frontal periventricular white matter at RIGHT caudate head infarcts.  RIGHT occipital and posterior temporal infarcts.  Small vessel chronic ischemic changes deep cerebral white matter.  No new areas of infarction or intracranial hemorrhage.  No mass lesion or extra-axial fluid collections.  Atherosclerotic calcification of carotid siphons.  Bones and sinuses unremarkable.  IMPRESSION: RIGHT occipital and posterior temporal infarcts.  Old RIGHT frontal periventricular white matter and RIGHT caudate head infarcts.  Small vessel chronic ischemic changes of deep cerebral white matter.  No significant interval change.   Electronically Signed   By: Lavonia Dana M.D.   On: 09/01/2014 09:40    12-lead ECG sinus rhythm, rate 73, normal intervals  All prior EKG's in EPIC reviewed with no documented atrial fibrillation  Telemetry  sinus rhythm with intermittent bundle branch block  Assessment and Plan:  1. Cryptogenic stroke The patient presents with cryptogenic stroke.  I spoke at length with the patient about monitoring for afib with an implantable loop recorder.  Risks, benefits, and alteratives to implantable loop recorder were discussed with the patient today.   At this time, the patient is very clear in their decision to proceed with implantable loop recorder.   Wound care was reviewed with the patient (keep incision clean and dry for 3 days).  Wound check scheduled for 09-16-14 at 2:30PM.   Please call with questions.   Chanetta Marshall, NP 09/03/2014 8:24 AM   I have seen, examined the patient, and reviewed the above assessment and plan.  On exam, RRR. Changes to above are made where necessary.  I have spoken at length with daughter, spouse, and patient about ILR vs 30 day monitor.  They are clear that they would like to proceed with ILR placement at this time.  Co Sign: Thompson Grayer, MD 09/03/2014 9:28 AM

## 2014-09-04 ENCOUNTER — Telehealth: Payer: Self-pay | Admitting: Internal Medicine

## 2014-09-04 ENCOUNTER — Encounter (HOSPITAL_COMMUNITY): Payer: Self-pay | Admitting: Internal Medicine

## 2014-09-04 DIAGNOSIS — M545 Low back pain: Secondary | ICD-10-CM

## 2014-09-04 DIAGNOSIS — M79609 Pain in unspecified limb: Secondary | ICD-10-CM

## 2014-09-04 LAB — GLUCOSE, CAPILLARY
GLUCOSE-CAPILLARY: 143 mg/dL — AB (ref 70–99)
Glucose-Capillary: 195 mg/dL — ABNORMAL HIGH (ref 70–99)
Glucose-Capillary: 105 mg/dL — ABNORMAL HIGH (ref 70–99)
Glucose-Capillary: 113 mg/dL — ABNORMAL HIGH (ref 70–99)

## 2014-09-04 NOTE — Telephone Encounter (Signed)
FYI

## 2014-09-04 NOTE — Progress Notes (Addendum)
[facility] PROGRESS NOTE  Kayla [name] WUJ:[id number] DOB: April 16, 1941 DOA: 08/30/2014 PCP: Olga Millers, MD  Assessment/Plan: 1-multiple acute/subacute ischemic stroke Multiple acute infarct involving the Right frontal lobe, temporal lobe and occipital lobe, embolic pattern consistent with cardioembolic strokes.:  Improving  left side weakness and confusion -MRI/MRA with acute infarcts involving the right frontal lobe in a watershed distribution, right medial temporal lobe and right occipital lobe. The combination of findings is highly suspicious for cardioembolic phenomenon. -carotid duplex: bilateral ica stenosis 1-39%; vertebral flow antegrade  -2-D echo: no source of emboli, preserved EF -TEE not showing PFO or source of emboli; loop recorder implanted no antithrombotic prior to admission, now on aspirin 325 mg orally every day. -patient with LDL 110 (goal is < 70): will continue lipitor -PT/OT: CIR, consult pending    2-essential HTN: fair control -continue atenolol, HCTZ and ARB -hydralazine PRN ordered -follow renal function  3-AKI:  -gentle hydration given -Cr back to normal (1.01 on 3/15) -will monitor renal function, now that we have resume home antihypertensive agents  4-diabetes mellitus: continue SSI -A1C 7.8 -at discharge will recommend initiation of metformin BID  5-HLD: continue statins as mentioned above  6-residual left sided weakness: patient to be seen by CIR as recommended by PT -will monitor  7-lung nodule: patient with planned CT chest as an outpatient -repeat CXR during this admission not showing nodules. -further decision and workup to be arranged by PCP Do recommend follow-up CT to confirm, as CT is more sensitive  DVT: lovenox  Code Status: Full Family Communication: daughter at bedside Disposition Plan: Anticipate discharge 1-2 days,; PT recommending CIR; TEE on 3/14, to CIR in am    Consultants:  Neurology   Cardiology  for TEE/loop recorder  Procedures:  2-D echo pending: - Left ventricle: The cavity size was normal. There was moderate concentric hypertrophy. Systolic function was normal. The estimated ejection fraction was in the range of 55% to 60%. Wall motion was normal; there were no regional wall motion abnormalities. Doppler parameters are consistent with abnormal left ventricular relaxation (grade 1 diastolic dysfunction).  Impressions: - No cardiac source of embolism was identified, but cannot be ruled out on the basis of this examination.   Carotid duplex: 1-39% ICA stenosis bilaterally, vertebral flow antegrade   TEE: preliminary info normal LV function; atrial septal aneurysm; negative saline microcavitation study.   Loop Recorder implantation 3/15  Antibiotics:  None   HPI/Subjective: No CP, no SOB. Afebrile.   Objective: Filed Vitals:   09/04/14 0900  BP: 121/94  Pulse: 84  Temp: 98.3 F (36.8 C)  Resp: 20   No intake or output data in the 24 hours ending 09/04/14 1241 Filed Weights   08/30/14 1113  Weight: 85.276 kg (188 lb)    Exam:   General:  AAOX2, no CP, no SOB. Left side weakness unchanged   Cardiovascular: S1 and S2, no rubs or gallops; positive SEM  Respiratory: CTA bilaterally  Abdomen: soft, NT, ND, positive BS  Musculoskeletal: no edema or cyanosis   Neuro: left side weakness with MS 3/5, patient also reported paresthesia; normal speech and she is AAOX2 (slight fluctuation in mentation today; ??sundowning)    Data Reviewed: Basic Metabolic Panel:  Recent Labs Lab 08/30/14 1146 08/30/14 1705 09/01/14 0615 09/03/14 0615  NA 139  --  138 139  K 4.0  --  3.5 4.2  CL 104  --  104 106  CO2 27  --  25 25  GLUCOSE  207*  --  154* 150*  BUN 21  --  19 19  CREATININE 1.08 1.22* 1.09 1.01  CALCIUM 8.9  --  9.1 8.7  MG  --   --  2.0  --    Liver Function Tests:  Recent Labs Lab 08/30/14 1146  AST 22  ALT 13  ALKPHOS 62   BILITOT 0.4  PROT 7.3  ALBUMIN 3.6   CBC:  Recent Labs Lab 08/30/14 1146 08/30/14 1705  WBC 5.4 5.5  HGB 14.2 13.7  HCT 43.9 42.2  MCV 89.6 87.6  PLT 244 248   CBG:  Recent Labs Lab 09/03/14 1154 09/03/14 1618 09/03/14 2152 09/04/14 0705 09/04/14 1154  GLUCAP 174* 157* 156* 143* 195*   Studies: No results found.  Scheduled Meds: . aspirin  300 mg Rectal Daily   Or  . aspirin  325 mg Oral Daily  . atenolol  50 mg Oral Daily  . atorvastatin  20 mg Oral q1800  . bisacodyl  10 mg Rectal Once  . enoxaparin (LOVENOX) injection  40 mg Subcutaneous Q24H  . hydrochlorothiazide  25 mg Oral Daily  . insulin aspart  0-9 Units Subcutaneous TID AC & HS  . irbesartan  300 mg Oral Daily  . polyethylene glycol  17 g Oral Daily  . senna-docusate  1 tablet Oral BID   Continuous Infusions:    Principal Problem:   Altered mental status Active Problems:   HYPERCHOLESTEROLEMIA, MILD   Essential hypertension   Type 2 diabetes mellitus with diabetic neuropathy   Acute ischemic right MCA stroke   Acute right PCA stroke   AKI (acute kidney injury)   Confusion    Time spent: 30 minutes   [facility] Pager [phone]. If 7PM-7AM, please contact night-coverage at [email or web address], password [facility] 09/04/2014, 12:41 PM  LOS: 4 days

## 2014-09-04 NOTE — Progress Notes (Addendum)
I have received insurance approval to admit pt to inpt rehab. I will plan admission tomorrow am . I have notified pt, her daughter at bedside, RN CM, and SW. I will follow up in the morning . I have notified Dr. Allyson Sabal also. [phone]

## 2014-09-04 NOTE — Telephone Encounter (Signed)
Patient is still in the hospital. They did CT of chest and the nodules found before appear to be gone. Olin Hauser wanted to notify us of this.

## 2014-09-04 NOTE — Progress Notes (Signed)
Physical Therapy Treatment Patient Details Name: Kayla [name] MRN: [id number] DOB: 1941/01/27 Today's Date: 09/04/2014    History of Present Illness 74 yo female with onset of  L side weakness was diagnosed with acute infarcts of R occipital and posterior inferior temp lobe and ant R frontal lobe     PT Comments    Patient very motivated and agreeable to work with therapy. Noted decreased L foot clearance with increased ambulation and fatigue. Educated patient on some simple seated therex. Patient hopeful for CIR later today. Continue to recommend comprehensive inpatient rehab (CIR) for post-acute therapy needs.   Follow Up Recommendations  CIR;Supervision/Assistance - 24 hour     Equipment Recommendations       Recommendations for Other Services       Precautions / Restrictions Precautions Precautions: Fall Restrictions Weight Bearing Restrictions: No    Mobility  Bed Mobility Overal bed mobility: Needs Assistance Bed Mobility: Supine to Sit     Supine to sit: Min assist     General bed mobility comments: Use of rails and cueing. HOB elevated  Transfers Overall transfer level: Needs assistance     Sit to Stand: Min guard;Min assist         General transfer comment: Patient with safe technique. Cues for safety. Min A to ensure balance x2.   Ambulation/Gait Ambulation/Gait assistance: Min assist Ambulation Distance (Feet): 200 Feet (214ft)     Gait velocity: sporatic   General Gait Details: Patient continues with slight anterior translation of gait but much more controlled this session with cueing. Patient with decreased L foot clearance with fatigue and patient requiring cues to clear L foot with stepping.    Stairs            Wheelchair Mobility    Modified Rankin (Stroke Patients Only) Modified Rankin (Stroke Patients Only) Pre-Morbid Rankin Score: Slight disability Modified Rankin: Moderately severe disability     Balance      Sitting balance-Leahy Scale: Fair       Standing balance-Leahy Scale: Fair                      Cognition Arousal/Alertness: Awake/alert Behavior During Therapy: WFL for tasks assessed/performed Overall Cognitive Status: Impaired/Different from baseline Area of Impairment: Problem solving;Safety/judgement         Safety/Judgement: Decreased awareness of safety   Problem Solving: Requires verbal cues General Comments: Patient more safe and focused on task this session. Had some L inattention this session. Patient able to use multiple resources to pathfind way back to room this session with some cueing and increased time.     Exercises General Exercises - Lower Extremity Long Arc Quad: AAROM;Left;10 reps Toe Raises: AROM;Left;10 reps Heel Raises: AROM;Left;10 reps    General Comments        Pertinent Vitals/Pain Pain Assessment: No/denies pain    Home Living                      Prior Function            PT Goals (current goals can now be found in the care plan section) Progress towards PT goals: Progressing toward goals    Frequency  Min 4X/week    PT Plan Current plan remains appropriate    Co-evaluation             End of Session Equipment Utilized During Treatment: Gait belt Activity Tolerance: Patient tolerated treatment well Patient left: in  chair;with call bell/phone within reach;with chair alarm set;with family/visitor present     Time: 0823-0849 PT Time Calculation (min) (ACUTE ONLY): 26 min  Charges:  $Gait Training: 8-22 mins $Therapeutic Activity: 8-22 mins                    G Codes:      Jacqualyn Posey 09/04/2014, 8:57 AM 09/04/2014 Jacqualyn Posey PTA [phone] pager [phone] office

## 2014-09-04 NOTE — Progress Notes (Signed)
Occupational Therapy Treatment Patient Details Name: Kayla [name] MRN: [id number] DOB: [birth year] Today's Date: 09/04/2014    History of present illness 74 yo female with onset of  L side weakness was diagnosed with acute infarcts of R occipital and posterior inferior temp lobe and ant R frontal lobe    OT comments  Pt agreeable to session. Continue to recommend CIR for rehab.  Follow Up Recommendations  CIR;Supervision/Assistance - 24 hour    Equipment Recommendations  Other (comment) (defer to next venue)    Recommendations for Other Services Rehab consult;Speech consult    Precautions / Restrictions Precautions Precautions: Fall Restrictions Weight Bearing Restrictions: No       Mobility Bed Mobility      General bed mobility comments: not assessed  Transfers Overall transfer level: Needs assistance   Transfers: Sit to/from Stand Sit to Stand: Min guard         General transfer comment: Min guard for safety.    Balance     Min guard-Min A for ambulation.              ADL Overall ADL's : Needs assistance/impaired     Grooming: Applying deodorant;Supervision/safety;Standing;Set up (deodorant at sink already) Grooming Details (indicate cue type and reason): pt able to open deodorant Upper Body Bathing: Supervision/ safety;Set up;Standing   Lower Body Bathing: Set up;Supervison/ safety;Sit to/from stand   Upper Body Dressing : Standing Upper Body Dressing Details (indicate cue type and reason): OT untied gown for pt to bathe; pt unable to tie gown back requiring assist Lower Body Dressing: Sit to/from stand;Minimal assistance   Toilet Transfer: Min guard;Ambulation;Comfort height toilet   Toileting- Clothing Manipulation and Hygiene: Min guard;Sit to/from stand       Functional mobility during ADLs: Min guard;Minimal assistance General ADL Comments: Encouraged pt to be using left hand. Educated on dressing technique. Pt took a little extra  time to locate item on left side of sink. Ambulated in hallway and pt walking very fast with cues and assist to slow down. Pt distracted by other people in hallway.       Vision                     Perception     Praxis      Cognition  Awake/Alert Behavior During Therapy: Impulsive Overall Cognitive Status: Impaired/Different from baseline Area of Impairment: Safety/judgement;Attention   Current Attention Level: Sustained      Safety/Judgement: Decreased awareness of safety;Decreased awareness of deficits      Extremity/Trunk Assessment               Exercises  Other Exercises Other Exercises: fine motor coordination activity    Shoulder Instructions       General Comments      Pertinent Vitals/ Pain       Pain Assessment: 0-10 Pain Score: 3  Pain Location: back of right leg Pain Descriptors / Indicators: Sore Pain Intervention(s): Monitored during session;Repositioned  Home Living                                          Prior Functioning/Environment              Frequency Min 2X/week     Progress Toward Goals  OT Goals(current goals can now be found in the care plan section)  Progress towards OT goals:  Progressing toward goals (updated some goals and added)  Acute Rehab OT Goals Patient Stated Goal: get straightened out OT Goal Formulation: With patient Time For Goal Achievement: 09/07/14 Potential to Achieve Goals: Good ADL Goals Pt Will Perform Grooming: standing;with set-up (locating items on left side) Pt Will Perform Upper Body Bathing: with set-up;standing Pt Will Perform Lower Body Bathing: sit to/from stand;with set-up Pt Will Perform Lower Body Dressing: sit to/from stand;with set-up Pt Will Transfer to Toilet: ambulating;with supervision Pt Will Perform Toileting - Clothing Manipulation and hygiene: sit to/from stand;with modified independence Additional ADL Goal #1: Pt will independently perform HEP  for LUE to increase strength and coordination.  Plan Discharge plan remains appropriate    Co-evaluation                 End of Session Equipment Utilized During Treatment: Gait belt   Activity Tolerance Patient limited by fatigue   Patient Left in chair;with call bell/phone within reach;with chair alarm set;with family/visitor present   Nurse Communication          Time: [phone] OT Time Calculation (min): 19 min  Charges: OT General Charges $OT Visit: 1 Procedure OT Treatments $Self Care/Home Management : 8-22 mins  Benito Mccreedy OTR/L [phone] 09/04/2014, 10:05 AM

## 2014-09-05 ENCOUNTER — Inpatient Hospital Stay (HOSPITAL_COMMUNITY)
Admission: RE | Admit: 2014-09-05 | Discharge: 2014-09-17 | DRG: 057 | Disposition: A | Discharge status: Home Health | Payer: Medicare HMO | Source: Intra-hospital | Attending: Physical Medicine & Rehabilitation | Admitting: Physical Medicine & Rehabilitation

## 2014-09-05 DIAGNOSIS — I69354 Hemiplegia and hemiparesis following cerebral infarction affecting left non-dominant side: Principal | ICD-10-CM

## 2014-09-05 DIAGNOSIS — D573 Sickle-cell trait: Secondary | ICD-10-CM | POA: Diagnosis present

## 2014-09-05 DIAGNOSIS — F419 Anxiety disorder, unspecified: Secondary | ICD-10-CM | POA: Diagnosis present

## 2014-09-05 DIAGNOSIS — H53462 Homonymous bilateral field defects, left side: Secondary | ICD-10-CM | POA: Diagnosis present

## 2014-09-05 DIAGNOSIS — Z9119 Patient's noncompliance with other medical treatment and regimen: Secondary | ICD-10-CM | POA: Diagnosis present

## 2014-09-05 DIAGNOSIS — I63531 Cerebral infarction due to unspecified occlusion or stenosis of right posterior cerebral artery: Secondary | ICD-10-CM | POA: Diagnosis not present

## 2014-09-05 DIAGNOSIS — E1142 Type 2 diabetes mellitus with diabetic polyneuropathy: Secondary | ICD-10-CM | POA: Diagnosis present

## 2014-09-05 DIAGNOSIS — I10 Essential (primary) hypertension: Secondary | ICD-10-CM | POA: Diagnosis present

## 2014-09-05 DIAGNOSIS — E780 Pure hypercholesterolemia: Secondary | ICD-10-CM | POA: Diagnosis present

## 2014-09-05 DIAGNOSIS — K219 Gastro-esophageal reflux disease without esophagitis: Secondary | ICD-10-CM | POA: Diagnosis present

## 2014-09-05 DIAGNOSIS — Z95818 Presence of other cardiac implants and grafts: Secondary | ICD-10-CM | POA: Diagnosis not present

## 2014-09-05 DIAGNOSIS — I69398 Other sequelae of cerebral infarction: Secondary | ICD-10-CM | POA: Diagnosis not present

## 2014-09-05 DIAGNOSIS — M797 Fibromyalgia: Secondary | ICD-10-CM | POA: Diagnosis present

## 2014-09-05 DIAGNOSIS — E785 Hyperlipidemia, unspecified: Secondary | ICD-10-CM | POA: Diagnosis present

## 2014-09-05 DIAGNOSIS — G8190 Hemiplegia, unspecified affecting unspecified side: Secondary | ICD-10-CM | POA: Diagnosis not present

## 2014-09-05 DIAGNOSIS — K5900 Constipation, unspecified: Secondary | ICD-10-CM | POA: Diagnosis present

## 2014-09-05 DIAGNOSIS — I639 Cerebral infarction, unspecified: Secondary | ICD-10-CM | POA: Diagnosis not present

## 2014-09-05 LAB — COMPREHENSIVE METABOLIC PANEL
ALK PHOS: 63 U/L (ref 39–117)
ALT: 18 U/L (ref 0–35)
AST: 22 U/L (ref 0–37)
Albumin: 3.2 g/dL — ABNORMAL LOW (ref 3.5–5.2)
Anion gap: 9 (ref 5–15)
BILIRUBIN TOTAL: 0.6 mg/dL (ref 0.3–1.2)
BUN: 21 mg/dL (ref 6–23)
CO2: 24 mmol/L (ref 19–32)
Calcium: 9.1 mg/dL (ref 8.4–10.5)
Chloride: 106 mmol/L (ref 96–112)
Creatinine, Ser: 1.19 mg/dL — ABNORMAL HIGH (ref 0.50–1.10)
GFR calc non Af Amer: 44 mL/min — ABNORMAL LOW (ref >90)
GFR, EST AFRICAN AMERICAN: 51 mL/min — AB (ref >90)
Glucose, Bld: 145 mg/dL — ABNORMAL HIGH (ref 70–99)
Potassium: 4 mmol/L (ref 3.5–5.1)
Sodium: 139 mmol/L (ref 135–145)
TOTAL PROTEIN: 6.8 g/dL (ref 6.0–8.3)

## 2014-09-05 LAB — CBC
HCT: 45.1 % (ref 36.0–46.0)
HEMOGLOBIN: 14.8 g/dL (ref 12.0–15.0)
MCH: 29.1 pg (ref 26.0–34.0)
MCHC: 32.8 g/dL (ref 30.0–36.0)
MCV: 88.8 fL (ref 78.0–100.0)
PLATELETS: 223 10*3/uL (ref 150–400)
RBC: 5.08 MIL/uL (ref 3.87–5.11)
RDW: 15.8 % — ABNORMAL HIGH (ref 11.5–15.5)
WBC: 4.8 10*3/uL (ref 4.0–10.5)

## 2014-09-05 LAB — CREATININE, SERUM
Creatinine, Ser: 1.35 mg/dL — ABNORMAL HIGH (ref 0.50–1.10)
GFR calc Af Amer: 44 mL/min — ABNORMAL LOW (ref >90)
GFR, EST NON AFRICAN AMERICAN: 38 mL/min — AB (ref >90)

## 2014-09-05 LAB — GLUCOSE, CAPILLARY
Glucose-Capillary: 141 mg/dL — ABNORMAL HIGH (ref 70–99)
Glucose-Capillary: 147 mg/dL — ABNORMAL HIGH (ref 70–99)
Glucose-Capillary: 148 mg/dL — ABNORMAL HIGH (ref 70–99)
Glucose-Capillary: 175 mg/dL — ABNORMAL HIGH (ref 70–99)

## 2014-09-05 MED ORDER — ASPIRIN 325 MG PO TABS: 325.0000 mg | ORAL_TABLET | Freq: Every day | ORAL | Status: DC

## 2014-09-05 MED ORDER — IRBESARTAN 300 MG PO TABS: 300.0000 mg | ORAL_TABLET | Freq: Every day | ORAL | Status: DC

## 2014-09-05 MED ORDER — ATORVASTATIN CALCIUM 20 MG PO TABS: 20.0000 mg | ORAL_TABLET | Freq: Every day | ORAL | Status: DC

## 2014-09-05 MED ORDER — ATENOLOL 50 MG PO TABS
50.0000 mg | ORAL_TABLET | Freq: Every day | ORAL | Status: DC
  Administered 2014-09-06 – 2014-09-17 (×12): 50 mg via ORAL
  Filled 2014-09-05 (×14): qty 1

## 2014-09-05 MED ORDER — ONDANSETRON HCL 4 MG PO TABS: 4.0000 mg | ORAL_TABLET | Freq: Four times a day (QID) | ORAL | Status: DC | PRN

## 2014-09-05 MED ORDER — SORBITOL 70 % SOLN
30.0000 mL | Freq: Every day | Status: DC | PRN
  Administered 2014-09-06 – 2014-09-16 (×3): 30 mL via ORAL
  Filled 2014-09-05 (×3): qty 30

## 2014-09-05 MED ORDER — SENNOSIDES-DOCUSATE SODIUM 8.6-50 MG PO TABS: 1.0000 | ORAL_TABLET | Freq: Two times a day (BID) | ORAL | Status: DC

## 2014-09-05 MED ORDER — POLYETHYLENE GLYCOL 3350 17 G PO PACK
17.0000 g | PACK | Freq: Every day | ORAL | Status: DC
  Administered 2014-09-06 – 2014-09-17 (×11): 17 g via ORAL
  Filled 2014-09-05 (×13): qty 1

## 2014-09-05 MED ORDER — HYDROCHLOROTHIAZIDE 25 MG PO TABS
25.0000 mg | ORAL_TABLET | Freq: Every day | ORAL | Status: DC
  Administered 2014-09-06 – 2014-09-17 (×12): 25 mg via ORAL
  Filled 2014-09-05 (×14): qty 1

## 2014-09-05 MED ORDER — IRBESARTAN 300 MG PO TABS
300.0000 mg | ORAL_TABLET | Freq: Every day | ORAL | Status: DC
  Administered 2014-09-06 – 2014-09-17 (×12): 300 mg via ORAL
  Filled 2014-09-05 (×14): qty 1

## 2014-09-05 MED ORDER — SENNOSIDES-DOCUSATE SODIUM 8.6-50 MG PO TABS
1.0000 | ORAL_TABLET | Freq: Two times a day (BID) | ORAL | Status: DC
  Administered 2014-09-05 – 2014-09-17 (×23): 1 via ORAL
  Filled 2014-09-05 (×25): qty 1

## 2014-09-05 MED ORDER — ENOXAPARIN SODIUM 40 MG/0.4ML ~~LOC~~ SOLN: 40.0000 mg | SUBCUTANEOUS | Status: DC

## 2014-09-05 MED ORDER — INSULIN ASPART 100 UNIT/ML ~~LOC~~ SOLN: 0.0000 [IU] | Freq: Three times a day (TID) | SUBCUTANEOUS | Status: DC

## 2014-09-05 MED ORDER — ASPIRIN 325 MG PO TABS
325.0000 mg | ORAL_TABLET | Freq: Every day | ORAL | Status: DC
  Administered 2014-09-06 – 2014-09-17 (×12): 325 mg via ORAL
  Filled 2014-09-05 (×14): qty 1

## 2014-09-05 MED ORDER — ACETAMINOPHEN 325 MG PO TABS: 325.0000 mg | ORAL_TABLET | ORAL | Status: DC | PRN

## 2014-09-05 MED ORDER — ASPIRIN 300 MG RE SUPP
300.0000 mg | Freq: Every day | RECTAL | Status: DC
  Filled 2014-09-05 (×13): qty 1

## 2014-09-05 MED ORDER — ALBUTEROL SULFATE (2.5 MG/3ML) 0.083% IN NEBU: 2.5000 mg | INHALATION_SOLUTION | Freq: Four times a day (QID) | RESPIRATORY_TRACT | Status: DC | PRN

## 2014-09-05 MED ORDER — POLYETHYLENE GLYCOL 3350 17 G PO PACK: 17.0000 g | PACK | Freq: Every day | ORAL | Status: DC

## 2014-09-05 MED ORDER — HYDROCHLOROTHIAZIDE 25 MG PO TABS: 25.0000 mg | ORAL_TABLET | Freq: Every day | ORAL | Status: DC

## 2014-09-05 MED ORDER — ONDANSETRON HCL 4 MG/2ML IJ SOLN: 4.0000 mg | Freq: Four times a day (QID) | INTRAMUSCULAR | Status: DC | PRN

## 2014-09-05 MED ORDER — ATORVASTATIN CALCIUM 20 MG PO TABS
20.0000 mg | ORAL_TABLET | Freq: Every day | ORAL | Status: DC
  Administered 2014-09-05 – 2014-09-16 (×12): 20 mg via ORAL
  Filled 2014-09-05 (×13): qty 1

## 2014-09-05 MED ORDER — INSULIN ASPART 100 UNIT/ML ~~LOC~~ SOLN
0.0000 [IU] | Freq: Three times a day (TID) | SUBCUTANEOUS | Status: DC
  Administered 2014-09-05: 1 [IU] via SUBCUTANEOUS
  Administered 2014-09-05: 2 [IU] via SUBCUTANEOUS
  Administered 2014-09-06: 3 [IU] via SUBCUTANEOUS
  Administered 2014-09-06 (×2): 2 [IU] via SUBCUTANEOUS
  Administered 2014-09-06: 1 [IU] via SUBCUTANEOUS
  Administered 2014-09-07: 2 [IU] via SUBCUTANEOUS
  Administered 2014-09-07: 1 [IU] via SUBCUTANEOUS
  Administered 2014-09-07 (×2): 2 [IU] via SUBCUTANEOUS
  Administered 2014-09-08: 1 [IU] via SUBCUTANEOUS
  Administered 2014-09-08: 2 [IU] via SUBCUTANEOUS
  Administered 2014-09-08: 5 [IU] via SUBCUTANEOUS
  Administered 2014-09-08 – 2014-09-09 (×2): 1 [IU] via SUBCUTANEOUS
  Administered 2014-09-09: 2 [IU] via SUBCUTANEOUS
  Administered 2014-09-09 – 2014-09-10 (×3): 1 [IU] via SUBCUTANEOUS
  Administered 2014-09-10: 3 [IU] via SUBCUTANEOUS
  Administered 2014-09-11: 2 [IU] via SUBCUTANEOUS
  Administered 2014-09-11: 1 [IU] via SUBCUTANEOUS
  Administered 2014-09-11: 2 [IU] via SUBCUTANEOUS
  Administered 2014-09-11: 1 [IU] via SUBCUTANEOUS
  Administered 2014-09-12: 2 [IU] via SUBCUTANEOUS
  Administered 2014-09-12: 1 [IU] via SUBCUTANEOUS
  Administered 2014-09-13: 2 [IU] via SUBCUTANEOUS
  Administered 2014-09-13 – 2014-09-14 (×3): 1 [IU] via SUBCUTANEOUS
  Administered 2014-09-14 (×2): 2 [IU] via SUBCUTANEOUS
  Administered 2014-09-15: 1 [IU] via SUBCUTANEOUS
  Administered 2014-09-15 (×2): 2 [IU] via SUBCUTANEOUS
  Administered 2014-09-15 – 2014-09-16 (×3): 1 [IU] via SUBCUTANEOUS
  Administered 2014-09-16: 2 [IU] via SUBCUTANEOUS
  Administered 2014-09-17: 1 [IU] via SUBCUTANEOUS

## 2014-09-05 MED ORDER — ENOXAPARIN SODIUM 40 MG/0.4ML ~~LOC~~ SOLN
40.0000 mg | SUBCUTANEOUS | Status: DC
  Administered 2014-09-05 – 2014-09-16 (×12): 40 mg via SUBCUTANEOUS
  Filled 2014-09-05 (×13): qty 0.4

## 2014-09-05 NOTE — Progress Notes (Signed)
Cristina Gong, RN Rehab Admission Coordinator Signed Physical Medicine and Rehabilitation PMR Pre-admission 09/03/2014 11:41 AM  Related encounter: ED to Hosp-Admission (Current) from 08/30/2014 in [place] Collapse All   PMR Admission Coordinator Pre-Admission Assessment  Patient: Kayla [name] is an 74 y.o., female MRN: [id number] DOB: Mar 10, 1941 Height: 5\' 6"  (167.6 cm) Weight: 85.276 kg (188 lb)  Insurance Information HMO: yes PPO: PCP: IPA: 80/20: OTHER: Medicare replacement policy PRIMARY: [employer] Medicare Policy#: [id number] Subscriber: pt CM Name: Asencion Partridge Phone#: [phone] Fax#: [phone] Pre-Cert#: [id number] Employer: retired approved 3/17 until 3/21 with update due 3/22 Benefits: Phone #: [phone] Name: 09/02/14 Eff. Date: 06/21/14 Deduct: none Out of Pocket Max: $4950 Life Max: none CIR: $285 per day copay days 1-6 then covers 100% SNF: no copay days 1-20; $160 copay per day days 21-100 Outpatient: $40 copay per visit Co-Pay: no visit limit Home Health: 100% Co-Pay: none DME: 80% Co-Pay: 20% Providers: in [place]  SECONDARY: none  Medicaid Application Date: Case Manager:  Disability Application Date: Case Worker:   Emergency Facilities manager Information    Name Relation Home Work Mobile   [place] Daughter [phone]     Kayla [name], Kayla [name] [phone]       Current Medical History  Patient Admitting Diagnosis: right brain infarct with left hemiparesis  History of Present Illness: Kayla [name] is a 74 y.o. right handed female with history of hypertension, diabetes mellitus and peripheral neuropathy, sickle cell trait and recent  bronchitis. Independently with occasional cane prior to admission living with her husband.   Admitted 08/30/2014 with altered mental status and left-sided weakness. MRI of the brain showed acute nonhemorrhagic infarct involving the right occipital lobe and posterior inferior right temporal lobe. MRA of the head showed high-grade tandem stenosis of the distal right M1 segment and associated proximal right M2 segment. Carotid Dopplers with no ICA stenosis. Patient did not receive TPA. Echocardiogram/TEE 3/14 not showing PFO or source of emboli. Loop recorder placed on 09/03/14. Neurology consulted presently on aspirin for CVA prophylaxis as well as subcutaneous Lovenox for DVT prophylaxis. LE dopplers negative.  CXR 3/11 did not show the nodule. Previously seen nodular density in the left base. On 2/28 there was concern for a pulmonary nodule and primary bronchogenic carcinoma. CT of chest Recommended and scheduled as an OP if needed on 09/06/2014.  Total: 7 NIH    Past Medical History  Past Medical History  Diagnosis Date  . Asthmatic bronchitis   . Hypertension   . Venous insufficiency   . Diabetes mellitus   . Hypercholesteremia   . Diverticulosis of colon   . Constipation   . Prolapse of vaginal vault after hysterectomy   . Proteinuria   . DJD (degenerative joint disease)   . Fibromyalgia   . Somatic dysfunction   . Anxiety   . Personal history of noncompliance with medical treatment, presenting hazards to health   . Sickle-cell trait   . Allergy     Shell fish  . GERD (gastroesophageal reflux disease)     Family History  family history includes Prostate cancer in her father.  Prior Rehab/Hospitalizations: none  Current Medications   Current facility-administered medications:  . albuterol (PROVENTIL) (2.5 MG/3ML) 0.083% nebulizer solution 2.5 mg, 2.5 mg, Nebulization, Q6H PRN, Maryann Mikhail, DO . aspirin  suppository 300 mg, 300 mg, Rectal, Daily **OR** aspirin tablet 325 mg, 325 mg, Oral, Daily, Maryann Mikhail, DO, 325 mg at 09/05/14  6283 . atenolol (TENORMIN) tablet 50 mg, 50 mg, Oral, Daily, Barton Dubois, MD, 50 mg at 09/05/14 [phone] . atorvastatin (LIPITOR) tablet 20 mg, 20 mg, Oral, q1800, David L Rinehuls, PA-C, 20 mg at 09/04/14 1720 . enoxaparin (LOVENOX) injection 40 mg, 40 mg, Subcutaneous, Q24H, Maryann Mikhail, DO, 40 mg at 09/04/14 1721 . hydrALAZINE (APRESOLINE) injection 10 mg, 10 mg, Intravenous, Q6H PRN, Maryann Mikhail, DO . hydrochlorothiazide (HYDRODIURIL) tablet 25 mg, 25 mg, Oral, Daily, Barton Dubois, MD, 25 mg at 09/05/14 0942 . insulin aspart (novoLOG) injection 0-9 Units, 0-9 Units, Subcutaneous, TID AC & HS, Maryann Mikhail, DO, 1 Units at 09/05/14 [phone] . irbesartan (AVAPRO) tablet 300 mg, 300 mg, Oral, Daily, Barton Dubois, MD, 300 mg at 09/05/14 [phone] . polyethylene glycol (MIRALAX / GLYCOLAX) packet 17 g, 17 g, Oral, Daily, Barton Dubois, MD, 17 g at 09/05/14 [phone] . senna-docusate (Senokot-S) tablet 1 tablet, 1 tablet, Oral, BID, Barton Dubois, MD, 1 tablet at 09/05/14 9485  Patients Current Diet: Diet heart healthy/carb modified Diet - low sodium heart healthy with thin liquids  Precautions / Restrictions Precautions Precautions: Fall Restrictions Weight Bearing Restrictions: No   Prior Activity Level Mod I with a cane. Independent with adls. Drives. Lives with husband of 35 years. Cooks.  Home Assistive Devices / Equipment Home Assistive Devices/Equipment: Cane (specify quad or straight), Eyeglasses, Dentures (specify type), CBG Meter (upper dentures, straight cane) Home Equipment: Shower seat, Toilet riser  Prior Functional Level Prior Function Level of Independence: Independent with assistive device(s) (walking without cane usually recently)  Current Functional Level Cognition  Arousal/Alertness: Awake/alert Overall Cognitive Status:  Impaired/Different from baseline Current Attention Level: Sustained Orientation Level: Oriented X4 (delayed responses) Safety/Judgement: Decreased awareness of safety, Decreased awareness of deficits General Comments: Patient more safe and focused on task this session. Had some L inattention this session. Patient able to use multiple resources to pathfind way back to room this session with some cueing and increased time.  Attention: Alternating Alternating Attention: Impaired Memory: (mild deficits in short-term recall) Awareness: Impaired Awareness Impairment: Intellectual impairment Problem Solving: Impaired Problem Solving Impairment: Verbal complex Safety/Judgment: (tba)   Extremity Assessment (includes Sensation/Coordination)  Upper Extremity Assessment: LUE deficits/detail LUE Deficits / Details: 3-/5 shoulder flexion LUE Sensation: decreased light touch LUE Coordination: decreased fine motor, decreased gross motor  Lower Extremity Assessment: Defer to PT evaluation LLE Deficits / Details: 3+ to 4- LLE strength    ADLs  Overall ADL's : Needs assistance/impaired Eating/Feeding: Minimal assistance, Sitting Grooming: Applying deodorant, Supervision/safety, Standing, Set up (deodorant at sink already) Grooming Details (indicate cue type and reason): pt able to open deodorant Upper Body Bathing: Supervision/ safety, Set up, Standing Lower Body Bathing: Set up, Supervison/ safety, Sit to/from stand Lower Body Bathing Details (indicate cue type and reason): washed peri area/buttocks Upper Body Dressing : Standing Upper Body Dressing Details (indicate cue type and reason): OT untied gown for pt to bathe; pt unable to tie gown back requiring assist Lower Body Dressing: Sit to/from stand, Minimal assistance Toilet Transfer: Min guard, Ambulation, Comfort height toilet Toileting- Clothing Manipulation and Hygiene: Min guard, Sit to/from stand Functional mobility during ADLs:  Min guard, Minimal assistance General ADL Comments: Encouraged pt to be using left hand. Educated on dressing technique. Pt took a little extra time to locate item on left side of sink. Ambulated in hallway and pt walking very fast with cues and assist to slow down. Pt distracted by other people in hallway.     Mobility  Overal bed mobility: Needs Assistance Bed Mobility: Supine to Sit Rolling: Min assist Supine to sit: Min assist Sit to supine: Supervision General bed mobility comments: not assessed    Transfers  Overall transfer level: Needs assistance Equipment used: None Transfers: Sit to/from Stand Sit to Stand: Min guard Stand pivot transfers: Min assist General transfer comment: Min guard for safety.    Ambulation / Gait / Stairs / Wheelchair Mobility  Ambulation/Gait Ambulation/Gait assistance: Museum/gallery curator (Feet): 200 Feet (268ft) Assistive device: 1 person hand held assist Gait Pattern/deviations: Step-through pattern, Decreased stride length, Scissoring, Decreased dorsiflexion - left, Drifts right/left Gait velocity: sporatic Gait velocity interpretation: Below normal speed for age/gender General Gait Details: Patient continues with slight anterior translation of gait but much more controlled this session with cueing. Patient with decreased L foot clearance with fatigue and patient requiring cues to clear L foot with stepping.     Posture / Balance Balance Overall balance assessment: Needs assistance Sitting-balance support: Feet supported, No upper extremity supported Sitting balance-Leahy Scale: Fair Postural control: Posterior lean Standing balance support: During functional activity Standing balance-Leahy Scale: Fair Standing balance comment: LUE has been inconsistent on RW but with attending to the arm is more stable Standardized Balance Assessment Standardized Balance Assessment : Dynamic Gait Index Dynamic Gait Index Level  Surface: Mild Impairment Change in Gait Speed: Mild Impairment Gait with Horizontal Head Turns: Mild Impairment Gait with Vertical Head Turns: Mild Impairment Gait and Pivot Turn: Moderate Impairment Step Over Obstacle: Severe Impairment    Special needs/care consideration   Bowel mgmt: no BM since admission except very small 3/16 after a suppository Bladder mgmt: urgency New loop recorder placed 09/03/14 DM Hgb A1C 7.5 Mention of ETOH use as risk factor in history   Previous Home Environment Living Arrangements: Spouse/significant other Lives With: Spouse Available Help at Discharge: Family, Other (Comment) (dtr is very supportive) Type of Home: House Home Layout: One level Home Access: Stairs to enter Entrance Stairs-Rails: Right, Left, Can reach both Entrance Stairs-Number of Steps: 2 Bathroom Shower/Tub: Chiropodist: Standard Bathroom Accessibility: Yes How Accessible: Accessible via walker Home Care Services: No  Discharge Living Setting Plans for Discharge Living Setting: Patient's home, Lives with (comment), Other (Comment) (spouse) Type of Home at Discharge: House Discharge Home Layout: One level Discharge Home Access: Stairs to enter Entrance Stairs-Rails: Right, Left, Can reach both Entrance Stairs-Number of Steps: 2 Discharge Bathroom Shower/Tub: Tub/shower unit Discharge Bathroom Toilet: Standard Discharge Bathroom Accessibility: Yes How Accessible: Accessible via walker Does the patient have any problems obtaining your medications?: No  Social/Family/Support Systems Patient Roles: Spouse, Parent Contact Information: Herbie [place], spouse, and daughter, Olin Hauser Anticipated Caregiver: Olin Hauser and spouse Anticipated Ambulance person Information: see above Ability/Limitations of Caregiver: spouse supervsion , Daughter lives close and very supportive Caregiver Availability: 24/7 Discharge Plan Discussed with  Primary Caregiver: Yes Is Caregiver In Agreement with Plan?: Yes Does Caregiver/Family have Issues with Lodging/Transportation while Pt is in Rehab?: No  Goals/Additional Needs Patient/Family Goal for Rehab: Mod I to supervision with PT, OT, and SLP Expected length of stay: [facility] 7-10 days Pt/Family Agrees to Admission and willing to participate: Yes Program Orientation Provided & Reviewed with Pt/Caregiver Including Roles & Responsibilities: Yes  Decrease burden of Care through IP rehab admission: n/a  Possible need for SNF placement upon discharge:not anticipated  Patient Condition: This patient's medical and functional status has changed since the consult dated: 08/31/2014 in which the Rehabilitation Physician determined and documented that the patient's  condition is appropriate for intensive rehabilitative care in an inpatient rehabilitation facility. See "History of Present Illness" (above) for medical update. Functional changes are: min to mod assist. Patient's medical and functional status update has been discussed with the Rehabilitation physician and patient remains appropriate for inpatient rehabilitation. Will admit to inpatient rehab today.  Preadmission Screen Completed By: Cleatrice Burke, 09/05/2014 11:36 AM ______________________________________________________________________  Discussed status with Dr. Letta Pate on 09/05/2014 at 1117 and received telephone approval for admission today.  Admission Coordinator: Cleatrice Burke, time 4650 Date 09/05/2014          Cosigned by: Charlett Blake, MD at 09/05/2014 11:55 AM  Revision History     Date/Time User Provider Type Action   09/05/2014 11:55 AM Charlett Blake, MD Physician Cosign   09/05/2014 11:37 AM Cristina Gong, RN Rehab Admission Coordinator Sign   09/05/2014 11:37 AM Cristina Gong, RN Rehab Admission Coordinator Sign   View Details Report

## 2014-09-05 NOTE — Discharge Summary (Signed)
Physician Discharge Summary  Kayla [name] MRN: [id number] DOB/AGE: December 29, 1940 74 y.o.  PCP: Olga Millers, MD   Admit date: 08/30/2014 Discharge date: 09/05/2014  Discharge Diagnoses:     Principal Problem:   Altered mental status Active Problems:   HYPERCHOLESTEROLEMIA, MILD   Essential hypertension   Type 2 diabetes mellitus with diabetic neuropathy   Acute ischemic right MCA stroke   Acute right PCA stroke   AKI (acute kidney injury)   Confusion     Medication List    STOP taking these medications        BENICAR HCT 40-25 MG per tablet  Generic drug:  olmesartan-hydrochlorothiazide     metFORMIN 500 MG tablet  Commonly known as:  GLUCOPHAGE      TAKE these medications        ACCU-CHEK AVIVA PLUS W/DEVICE Kit  Test blood sugar once daily     albuterol 108 (90 BASE) MCG/ACT inhaler  Commonly known as:  PROVENTIL HFA;VENTOLIN HFA  Inhale 2 puffs into the lungs every 6 (six) hours as needed for wheezing or shortness of breath.     aspirin 325 MG tablet  Take 1 tablet (325 mg total) by mouth daily.     atenolol 50 MG tablet  Commonly known as:  TENORMIN  Take 1 tablet (50 mg total) by mouth daily.     atorvastatin 20 MG tablet  Commonly known as:  LIPITOR  Take 1 tablet (20 mg total) by mouth daily at 6 PM.     benzonatate 100 MG capsule  Commonly known as:  TESSALON  Take 1 capsule (100 mg total) by mouth 3 (three) times daily as needed for cough.     FIRST-DUKES MOUTHWASH Susp  1 teaspoon by mouth gargle and swallow four times daily as needed     glucose blood test strip  Commonly known as:  ACCU-CHEK AVIVA  Check blood sugar once daily     hydrochlorothiazide 25 MG tablet  Commonly known as:  HYDRODIURIL  Take 1 tablet (25 mg total) by mouth daily.     irbesartan 300 MG tablet  Commonly known as:  AVAPRO  Take 1 tablet (300 mg total) by mouth daily.     polyethylene glycol packet  Commonly known as:  MIRALAX / GLYCOLAX  Take 17  g by mouth daily.     senna-docusate 8.6-50 MG per tablet  Commonly known as:  Senokot-S  Take 1 tablet by mouth 2 (two) times daily.        Discharge Condition: stable  Disposition: CIR   Consults:  neurology  Significant Diagnostic Studies: Dg Chest 2 View  08/30/2014   CLINICAL DATA:  Altered mental status, possible lung nodule  EXAM: CHEST  2 VIEW  COMPARISON:  08/18/2014  FINDINGS: Cardiac shadow is within normal limits. The lungs are well aerated bilaterally. The previously seen nodular density is less well appreciated on the current exam. No new focal infiltrate is seen. No new bony abnormality is noted.  IMPRESSION: No acute abnormality noted. The previously seen nodular density in the left base is not well appreciated on this exam.   Electronically Signed   By: Inez Catalina M.D.   On: 08/30/2014 14:32   Dg Chest 2 View (if Patient Has Fever And/or Copd)  08/18/2014   CLINICAL DATA:  74 year old female with cough, congestion and wheezing for the past week  EXAM: CHEST  2 VIEW  COMPARISON:  Prior chest x-ray 11/30/2010  FINDINGS:  Stable cardiac and mediastinal contours which remain within normal limits. No evidence of focal airspace consolidation, pulmonary edema, pleural effusion or pneumothorax. Stable central bronchitic change, interstitial prominence and slight hyper expansion. New nodular opacity in the periphery of the inferior left lung on the frontal view compared to June of 2012. No acute osseous abnormality.  IMPRESSION: 1. No evidence of acute cardiopulmonary process. 2. Interval development of a nodular opacity in the lateral aspect of the left lung base on the frontal view. This finding is concerning for a pulmonary nodule and primary bronchogenic carcinoma. Recommend further evaluation with nonemergent CT scan of the chest. 3. Background bronchitic change and mild interstitial prominence suggests underlying COPD.   Electronically Signed   By: Jacqulynn Cadet M.D.   On:  08/18/2014 12:51   Ct Head Wo Contrast  09/01/2014   CLINICAL DATA:  Stroke, worsening speech, LEFT side neglect, dull RIGHT side headache  EXAM: CT HEAD WITHOUT CONTRAST  TECHNIQUE: Contiguous axial images were obtained from the base of the skull through the vertex without intravenous contrast.  COMPARISON:  08/30/2014  FINDINGS: Normal ventricular morphology.  No midline shift or mass effect.  Old RIGHT frontal periventricular white matter at RIGHT caudate head infarcts.  RIGHT occipital and posterior temporal infarcts.  Small vessel chronic ischemic changes deep cerebral white matter.  No new areas of infarction or intracranial hemorrhage.  No mass lesion or extra-axial fluid collections.  Atherosclerotic calcification of carotid siphons.  Bones and sinuses unremarkable.  IMPRESSION: RIGHT occipital and posterior temporal infarcts.  Old RIGHT frontal periventricular white matter and RIGHT caudate head infarcts.  Small vessel chronic ischemic changes of deep cerebral white matter.  No significant interval change.   Electronically Signed   By: Lavonia Dana M.D.   On: 09/01/2014 09:40   Ct Head Wo Contrast  08/30/2014   CLINICAL DATA:  Confusion  EXAM: CT HEAD WITHOUT CONTRAST  TECHNIQUE: Contiguous axial images were obtained from the base of the skull through the vertex without intravenous contrast.  COMPARISON:  CT sinus 01/18/2014  FINDINGS: Ventricle size is normal.  Cerebral volume normal for age  Right frontal hypodensity consistent with a chronic ischemia. Hypodensity in the right anterior basal ganglia is unchanged from the prior CT and consistent with chronic infarction. Chronic ischemia throughout the cerebral white matter  Ill-defined hypodensity in the right occipital parietal lobe may represent a subacute infarct. There was a smaller area of hypodensity in this area on the prior CT however the current area is larger.  Negative for hemorrhage.  Calvarium intact.  Atherosclerotic vascular  calcification.  IMPRESSION: Chronic ischemic changes as above  Ill-defined hypodensity right occipital parietal lobe may represent subacute infarction. MRI recommended for further evaluation.  Negative for hemorrhage.   Electronically Signed   By: Franchot Gallo M.D.   On: 08/30/2014 13:29   Mr Brain Wo Contrast  08/30/2014   CLINICAL DATA:  Confusion. Stroke symptoms. Unsteady. Abnormal speech.  EXAM: MRI HEAD WITHOUT CONTRAST  MRA HEAD WITHOUT CONTRAST  TECHNIQUE: Multiplanar, multiecho pulse sequences of the brain and surrounding structures were obtained without intravenous contrast. Angiographic images of the head were obtained using MRA technique without contrast.  COMPARISON:  CT head without contrast from the same day.  FINDINGS: MRI HEAD FINDINGS  An acute nonhemorrhagic infarct is noted within the posterior right occipital and anterior right temporal lobe. Additional areas of acute nonhemorrhagic infarction are present within the anterior right frontal lobe. These correspond with the areas of  hypoattenuation on CT, some of which were felt to be chronic. T2 changes are associated with the areas of acute infarction.  A remote lacunar infarct involves the right caudate head and lentiform nucleus. Additional smaller remote lacunar infarcts are present within the basal ganglia and corona radiata bilaterally. Extensive periventricular and subcortical white matter changes are noted. White matter disease extends into the brainstem.  Flow is present in the major intracranial arteries. Bilateral lens replacements are noted. A polyp or mucous retention cyst is present in the inferior left maxillary sinus. The remaining paranasal sinuses and the mastoid air cells are clear. The skullbase is unremarkable. Midline structures are within normal limits.  MRA HEAD FINDINGS  Atherosclerotic changes are present within the cavernous internal carotid arteries bilaterally a 50% stenosis is present in the precavernous left  internal carotid artery. There is no significant stenosis of greater than 50% in the right internal carotid artery. There is some irregularity within the left M1 segment and bilateral A1 segments. The anterior communicating artery is not definitively seen. There is a high-grade stenosis of the distal right M1 segment and a tandem more distal stenosis in the a proximal right M2 branch. There is moderate attenuation of more much more distal left MCA branches.  The vertebral arteries are small bilaterally. The left PICA origin is visualized and normal. The right PICA is not visualized. The vertebrobasilar junction is within normal limits. There is mild narrowing of the distal basilar artery, less than 50%. A high-grade stenosis is present and and proximal left P1 segment. The right P2 segment is occluded. There is attenuation of distal left PCA branch vessels.  IMPRESSION: 1. Acute nonhemorrhagic infarcts involving the right occipital lobe and posterior inferior right temporal lobe. 2. The right P2 segment occlusion is associated with these infarcts. 3. Acute nonhemorrhagic infarcts involving the anterior right frontal lobe. 4. High-grade tandem stenoses of the distal right M1 segment and associated proximal right M2 segment. 5. Age advanced atrophy and diffuse white matter disease. 6. This corresponds with otherwise seen moderate distal small vessel disease.   Electronically Signed   By: Marin Roberts M.D.   On: 08/30/2014 19:55   Mr Maxine Glenn Head/brain Wo Cm  08/30/2014   CLINICAL DATA:  Confusion. Stroke symptoms. Unsteady. Abnormal speech.  EXAM: MRI HEAD WITHOUT CONTRAST  MRA HEAD WITHOUT CONTRAST  TECHNIQUE: Multiplanar, multiecho pulse sequences of the brain and surrounding structures were obtained without intravenous contrast. Angiographic images of the head were obtained using MRA technique without contrast.  COMPARISON:  CT head without contrast from the same day.  FINDINGS: MRI HEAD FINDINGS  An acute  nonhemorrhagic infarct is noted within the posterior right occipital and anterior right temporal lobe. Additional areas of acute nonhemorrhagic infarction are present within the anterior right frontal lobe. These correspond with the areas of hypoattenuation on CT, some of which were felt to be chronic. T2 changes are associated with the areas of acute infarction.  A remote lacunar infarct involves the right caudate head and lentiform nucleus. Additional smaller remote lacunar infarcts are present within the basal ganglia and corona radiata bilaterally. Extensive periventricular and subcortical white matter changes are noted. White matter disease extends into the brainstem.  Flow is present in the major intracranial arteries. Bilateral lens replacements are noted. A polyp or mucous retention cyst is present in the inferior left maxillary sinus. The remaining paranasal sinuses and the mastoid air cells are clear. The skullbase is unremarkable. Midline structures are within normal limits.  MRA HEAD FINDINGS  Atherosclerotic changes are present within the cavernous internal carotid arteries bilaterally a 50% stenosis is present in the precavernous left internal carotid artery. There is no significant stenosis of greater than 50% in the right internal carotid artery. There is some irregularity within the left M1 segment and bilateral A1 segments. The anterior communicating artery is not definitively seen. There is a high-grade stenosis of the distal right M1 segment and a tandem more distal stenosis in the a proximal right M2 branch. There is moderate attenuation of more much more distal left MCA branches.  The vertebral arteries are small bilaterally. The left PICA origin is visualized and normal. The right PICA is not visualized. The vertebrobasilar junction is within normal limits. There is mild narrowing of the distal basilar artery, less than 50%. A high-grade stenosis is present and and proximal left P1 segment.  The right P2 segment is occluded. There is attenuation of distal left PCA branch vessels.  IMPRESSION: 1. Acute nonhemorrhagic infarcts involving the right occipital lobe and posterior inferior right temporal lobe. 2. The right P2 segment occlusion is associated with these infarcts. 3. Acute nonhemorrhagic infarcts involving the anterior right frontal lobe. 4. High-grade tandem stenoses of the distal right M1 segment and associated proximal right M2 segment. 5. Age advanced atrophy and diffuse white matter disease. 6. This corresponds with otherwise seen moderate distal small vessel disease.   Electronically Signed   By: San Morelle M.D.   On: 08/30/2014 19:55      Microbiology: No results found for this or any previous visit (from the past 240 hour(s)).   Labs: Results for orders placed or performed during the hospital encounter of 08/30/14 (from the past 48 hour(s))  Glucose, capillary     Status: Abnormal   Collection Time: 09/03/14 11:54 AM  Result Value Ref Range   Glucose-Capillary 174 (H) 70 - 99 mg/dL  Glucose, capillary     Status: Abnormal   Collection Time: 09/03/14  4:18 PM  Result Value Ref Range   Glucose-Capillary 157 (H) 70 - 99 mg/dL  Glucose, capillary     Status: Abnormal   Collection Time: 09/03/14  9:52 PM  Result Value Ref Range   Glucose-Capillary 156 (H) 70 - 99 mg/dL   Comment 1 Notify RN    Comment 2 Documented in Char   Glucose, capillary     Status: Abnormal   Collection Time: 09/04/14  7:05 AM  Result Value Ref Range   Glucose-Capillary 143 (H) 70 - 99 mg/dL   Comment 1 Notify RN    Comment 2 Documented in Char   Glucose, capillary     Status: Abnormal   Collection Time: 09/04/14 11:54 AM  Result Value Ref Range   Glucose-Capillary 195 (H) 70 - 99 mg/dL  Glucose, capillary     Status: Abnormal   Collection Time: 09/04/14  4:21 PM  Result Value Ref Range   Glucose-Capillary 105 (H) 70 - 99 mg/dL  Glucose, capillary     Status: Abnormal    Collection Time: 09/04/14  8:57 PM  Result Value Ref Range   Glucose-Capillary 113 (H) 70 - 99 mg/dL  Comprehensive metabolic panel     Status: Abnormal   Collection Time: 09/05/14  5:33 AM  Result Value Ref Range   Sodium 139 135 - 145 mmol/L   Potassium 4.0 3.5 - 5.1 mmol/L   Chloride 106 96 - 112 mmol/L   CO2 24 19 - 32 mmol/L   Glucose,  Bld 145 (H) 70 - 99 mg/dL   BUN 21 6 - 23 mg/dL   Creatinine, Ser 3.74 (H) 0.50 - 1.10 mg/dL   Calcium 9.1 8.4 - 41.3 mg/dL   Total Protein 6.8 6.0 - 8.3 g/dL   Albumin 3.2 (L) 3.5 - 5.2 g/dL   AST 22 0 - 37 U/L   ALT 18 0 - 35 U/L   Alkaline Phosphatase 63 39 - 117 U/L   Total Bilirubin 0.6 0.3 - 1.2 mg/dL   GFR calc non Af Amer 44 (L) >90 mL/min   GFR calc Af Amer 51 (L) >90 mL/min    Comment: (NOTE) The eGFR has been calculated using the CKD EPI equation. This calculation has not been validated in all clinical situations. eGFR's persistently <90 mL/min signify possible Chronic Kidney Disease.    Anion gap 9 5 - 15  Glucose, capillary     Status: Abnormal   Collection Time: 09/05/14  6:32 AM  Result Value Ref Range   Glucose-Capillary 141 (H) 70 - 99 mg/dL     HPI :Kayla [name] is an 73 y.o. female history of hypertension, diabetes mellitus, hyperlipidemia, sickle cell trait and recent bronchitis, presenting with mental status changes with confusion as well as in intensity to bump into objects with walking and slight droop of left lower face. Patient was last known well on 08/19/2014. CT scan of her head showed an ill-defined hypodensity in the right parietal and occipital region. MRI showed acute infarction involving right occipital and posterior inferior temporal lobe, as well as acute infarction involving the anterior right frontal lobe. Moderate a showed right P2 segment occlusion, as well as right M1 and proximal M2 high-grade stenosis. NIH stroke score was 7.   HOSPITAL COURSE:  1-multiple acute/subacute ischemic stroke Multiple  acute infarct involving the Right frontal lobe, temporal lobe and occipital lobe, embolic pattern consistent with cardioembolic strokes.:  Improving left side weakness and confusion -MRI/MRA with acute infarcts involving the right frontal lobe in a watershed distribution, right medial temporal lobe and right occipital lobe. The combination of findings is highly suspicious for cardioembolic phenomenon. -carotid duplex: bilateral ica stenosis 1-39%; vertebral flow antegrade  -2-D echo: no source of emboli, preserved EF -TEE not showing PFO or source of emboli; loop recorder implanted no antithrombotic prior to admission, now on aspirin 325 mg orally every day. -patient with LDL 110 (goal is < 70): will continue lipitor -PT/OT: CIR, today    2-essential HTN: fair control -continue atenolol, HCTZ and ARB -hydralazine PRN ordered -follow renal function  3-AKI:  -gentle hydration given -Cr back to normal (1.01 on 3/15) Continue to monitor renal function, now that we have resume home antihypertensive agents  4-diabetes mellitus: continue SSI -A1C 7.8 -at discharge will recommend initiation of metformin BID  5-HLD: continue statins as mentioned above  6-residual left sided weakness: patient to be seen by CIR as recommended by PT -will monitor  7-lung nodule: patient with planned CT chest as an outpatient -repeat CXR during this admission not showing nodules. -further decision and workup to be arranged by PCP Do recommend follow-up CT to confirm, as CT is more sensitive  DVT: lovenox  Code Status: Full    Discharge Exam:   Blood pressure 143/76, pulse 71, temperature 97.9 F (36.6 C), temperature source Oral, resp. rate 18, height 5\' 6"  (1.676 m), weight 85.276 kg (188 lb), SpO2 96 %.   General: AAOX2, no CP, no SOB. Left side weakness unchanged  Cardiovascular: S1 and S2, no rubs or gallops; positive SEM  Respiratory: CTA bilaterally  Abdomen: soft, NT, ND,  positive BS  Musculoskeletal: no edema or cyanosis   Neuro: left side weakness with MS 3/5, patient also reported paresthesia; normal speech and she is AAOX2 (slight fluctuation in mentation today; ??sundowning)       Discharge Instructions    Ambulatory referral to Neurology    Complete by:  As directed   Dr. Erlinda Hong requests followup in 2 months     Diet - low sodium heart healthy    Complete by:  As directed   Carb modified     Increase activity slowly    Complete by:  As directed            Follow-up Information    Follow up with Xu,Jindong, MD In 2 months.   Specialty:  Neurology   Why:  [facility], Office will call you with appointment date & time   Contact information:   [address] [place] [id number] [phone]       Signed: Reyne Dumas 09/05/2014, 8:34 AM

## 2014-09-05 NOTE — Progress Notes (Signed)
I have insurance approval to admit pt to inpt rehab and will make arrangements to admit today. [phone]

## 2014-09-05 NOTE — H&P (Signed)
Physical Medicine and Rehabilitation Admission H&P   Chief Complaint  Patient presents with  . Altered Mental Status  : HPI: Kayla [name] is a 74 y.o. right handed female with history of hypertension, diabetes mellitus and peripheral neuropathy, sickle cell trait and recent bronchitis. Independently with occasional cane prior to admission living with her husband. Admitted 08/30/2014 with altered mental status and left-sided weakness. MRI of the brain showed acute nonhemorrhagic infarct involving the right occipital lobe and posterior inferior right temporal lobe. MRA of the head showed high-grade tandem stenosis of the distal right M1 segment and associated proximal right M2 segment. Carotid Dopplers with no ICA stenosis. Patient did not receive TPA. TEE 09/02/2014 with normal LV function, negative saline cavitation study. Neurology consulted presently on aspirin for CVA prophylaxis as well as subcutaneous Lovenox for DVT prophylaxis. Underwent implantable loop recorder 09/03/2014 per Dr. Rayann Heman. Physical and occupational therapy evaluations completed with recommendations of physical medicine rehabilitation consult. Patient was admitted for comprehensive rehabilitation program  No pain complaints today  ROS Review of Systems  Respiratory: Positive for cough.  Gastrointestinal: Positive for constipation.   GERD  Musculoskeletal: Positive for myalgias and joint pain.  Psychiatric/Behavioral:   Anxiety  All other systems reviewed and are negative    Past Medical History  Diagnosis Date  . Asthmatic bronchitis   . Hypertension   . Venous insufficiency   . Diabetes mellitus   . Hypercholesteremia   . Diverticulosis of colon   . Constipation   . Prolapse of vaginal vault after hysterectomy   . Proteinuria   . DJD (degenerative joint disease)   . Fibromyalgia   . Somatic dysfunction   . Anxiety   . Personal history  of noncompliance with medical treatment, presenting hazards to health   . Sickle-cell trait   . Allergy     Shell fish  . GERD (gastroesophageal reflux disease)    Past Surgical History  Procedure Laterality Date  . Abdominal hysterectomy    . Robotic assisted laparoscopic sacrocolpopexy  09/2009    Dr. Matilde Sprang  . Cataract extraction w/ intraocular lens implant, bilateral  2012   Family History  Problem Relation Age of Onset  . Prostate cancer Father    Social History:  reports that she has never smoked. She has never used smokeless tobacco. She reports that she drinks alcohol. She reports that she does not use illicit drugs. Allergies:  Allergies  Allergen Reactions  . Fish Allergy Hives  . Shellfish Allergy Hives  . Penicillins     REACTION: red rash  . Tomato Rash   Medications Prior to Admission  Medication Sig Dispense Refill  . albuterol (PROVENTIL HFA;VENTOLIN HFA) 108 (90 BASE) MCG/ACT inhaler Inhale 2 puffs into the lungs every 6 (six) hours as needed for wheezing or shortness of breath.    [name] atenolol (TENORMIN) 50 MG tablet Take 1 tablet (50 mg total) by mouth daily. 3 tablet 6  . benzonatate (TESSALON) 100 MG capsule Take 1 capsule (100 mg total) by mouth 3 (three) times daily as needed for cough. 21 capsule 0  . Blood Glucose Monitoring Suppl (ACCU-CHEK AVIVA PLUS) W/DEVICE KIT Test blood sugar once daily 1 kit 0  . Diphenhyd-Hydrocort-Nystatin (FIRST-DUKES MOUTHWASH) SUSP 1 teaspoon by mouth gargle and swallow four times daily as needed 120 mL 1  . glucose blood (ACCU-CHEK AVIVA) test strip Check blood sugar once daily 100 each 4  . metFORMIN (GLUCOPHAGE) 500 MG tablet TAKE 1 TABLET DAILY WITH BREAKFAST 90  tablet 3  . olmesartan-hydrochlorothiazide (BENICAR HCT) 40-25 MG per tablet Take 1 tablet by mouth daily.       Home: Home Living Family/patient  expects to be discharged to:: Private residence Living Arrangements: Spouse/significant other Available Help at Discharge: Family Type of Home: House Home Access: Stairs to enter Technical brewer of Steps: 2 Entrance Stairs-Rails: Right, Left, Can reach both Home Layout: One level Home Equipment: Shower seat, Toilet riser  Functional History: Prior Function Level of Independence: Independent with assistive device(s) (walking without cane usually recently)  Functional Status:  Mobility: Bed Mobility Overal bed mobility: Needs Assistance Bed Mobility: Supine to Sit, Sit to Supine Rolling: Min assist Supine to sit: Supervision Sit to supine: Supervision General bed mobility comments: not assesed Transfers Overall transfer level: Needs assistance Equipment used: Rolling walker (2 wheeled), 1 person hand held assist Transfers: Sit to/from Stand Sit to Stand: Min guard Stand pivot transfers: Min assist General transfer comment: assist to control descent to bed.  Ambulation/Gait Ambulation/Gait assistance: Min assist Ambulation Distance (Feet): 8 Feet Assistive device: Rolling walker (2 wheeled), 1 person hand held assist Gait Pattern/deviations: Step-through pattern, Decreased dorsiflexion - right, Decreased dorsiflexion - left, Shuffle, Wide base of support (8' x 2) Gait velocity: reduced Gait velocity interpretation: Below normal speed for age/gender    ADL: ADL Overall ADL's : Needs assistance/impaired Eating/Feeding: Minimal assistance, Sitting Grooming: Oral care, Wash/dry face, Applying deodorant, Set up, Supervision/safety, Standing Upper Body Bathing: Minimal assitance, Sitting Lower Body Bathing: Min guard (standing at sink) Lower Body Bathing Details (indicate cue type and reason): washed peri area/buttocks Upper Body Dressing : Minimal assistance, Sitting Lower Body Dressing: Minimal assistance, Sit to/from stand Toilet Transfer: Min guard, Minimal  assistance, Ambulation, RW (chair; also hand held assist) Toileting- Clothing Manipulation and Hygiene: Min guard (standing-pulled down briefs to wash bottom/peri area) Functional mobility during ADLs: Min guard, Minimal assistance, Rolling walker (walked with walker and hand held assist) General ADL Comments: Pt bumping into items on left side. Cues to turn head to left. OT placed ADL items on left side at sink and with time, pt able to locate. Pt left water running at sink requiring cue from OT to turn off. Talked with pt's daughter at end about d/c recommendation for pt/OT treatment interventions.  Cognition: Cognition Overall Cognitive Status: Impaired/Different from baseline Arousal/Alertness: Awake/alert Orientation Level: Oriented to person, Oriented to place, Oriented to situation, Disoriented to time Attention: Alternating Alternating Attention: Impaired Memory: (mild deficits in short-term recall) Awareness: Impaired Awareness Impairment: Intellectual impairment Problem Solving: Impaired Problem Solving Impairment: Verbal complex Safety/Judgment: (tba) Cognition Arousal/Alertness: Awake/alert Behavior During Therapy: WFL for tasks assessed/performed Overall Cognitive Status: Impaired/Different from baseline Area of Impairment: Attention, Problem solving, Memory Current Attention Level: Sustained Memory: Decreased short-term memory Problem Solving: Slow processing General Comments: pt forgetting to turn off sink water.   Physical Exam: Blood pressure 144/70, pulse 70, temperature 98.2 F (36.8 C), temperature source Oral, resp. rate 16, height $RemoveBe'5\' 6"'[id number]$  (1.676 m), weight 85.276 kg (188 lb), SpO2 98 %. Physical Exam HENT:  Head: Normocephalic.  Eyes: EOM are normal.  Neck: Normal range of motion. Neck supple. No thyromegaly present.  Cardiovascular: Normal rate and regular rhythm.  Respiratory: Effort normal and breath sounds normal. No respiratory distress.  GI:  Soft. Bowel sounds are normal. She exhibits no distension.  Neurological:  Mood is flat but appropriate. She makes good eye contact with examiner. She can provide her name, age and date of birth. Follows simple commands.  Fair awareness of her deficits. LUE: 2+ to 3-/5 prox to distal. LLE: 2+ HF, 3- knee and 3+ ankle. Left central 7. speeech slightly slurred  Skin: Skin is warm and dry.  Psychiatric: She has a normal mood and affect. Her behavior is normal Left homonomous hemianopsia   Lab Results Last 48 Hours    Results for orders placed or performed during the hospital encounter of 08/30/14 (from the past 48 hour(s))  Glucose, capillary Status: Abnormal   Collection Time: 08/31/14 4:55 PM  Result Value Ref Range   Glucose-Capillary 131 (H) 70 - 99 mg/dL  Glucose, capillary Status: Abnormal   Collection Time: 08/31/14 10:19 PM  Result Value Ref Range   Glucose-Capillary 146 (H) 70 - 99 mg/dL   Comment 1 Notify RN   Basic metabolic panel Status: Abnormal   Collection Time: 09/01/14 6:15 AM  Result Value Ref Range   Sodium 138 135 - 145 mmol/L   Potassium 3.5 3.5 - 5.1 mmol/L   Chloride 104 96 - 112 mmol/L   CO2 25 19 - 32 mmol/L   Glucose, Bld 154 (H) 70 - 99 mg/dL   BUN 19 6 - 23 mg/dL   Creatinine, Ser 1.09 0.50 - 1.10 mg/dL   Calcium 9.1 8.4 - 10.5 mg/dL   GFR calc non Af Amer 49 (L) >90 mL/min   GFR calc Af Amer 57 (L) >90 mL/min    Comment: (NOTE) The eGFR has been calculated using the CKD EPI equation. This calculation has not been validated in all clinical situations. eGFR's persistently <90 mL/min signify possible Chronic Kidney Disease.    Anion gap 9 5 - 15  Magnesium Status: None   Collection Time: 09/01/14 6:15 AM  Result Value Ref Range   Magnesium 2.0 1.5 - 2.5 mg/dL  Glucose, capillary Status: Abnormal   Collection Time: 09/01/14 6:45 AM   Result Value Ref Range   Glucose-Capillary 156 (H) 70 - 99 mg/dL   Comment 1 Notify RN   Glucose, capillary Status: Abnormal   Collection Time: 09/01/14 12:05 PM  Result Value Ref Range   Glucose-Capillary 167 (H) 70 - 99 mg/dL  Glucose, capillary Status: Abnormal   Collection Time: 09/01/14 4:30 PM  Result Value Ref Range   Glucose-Capillary 124 (H) 70 - 99 mg/dL  Glucose, capillary Status: Abnormal   Collection Time: 09/01/14 9:19 PM  Result Value Ref Range   Glucose-Capillary 152 (H) 70 - 99 mg/dL   Comment 1 Notify RN   Glucose, capillary Status: Abnormal   Collection Time: 09/02/14 6:57 AM  Result Value Ref Range   Glucose-Capillary 163 (H) 70 - 99 mg/dL   Comment 1 Notify RN    Comment 2 Documented in Char   Glucose, capillary Status: Abnormal   Collection Time: 09/02/14 1:00 PM  Result Value Ref Range   Glucose-Capillary 124 (H) 70 - 99 mg/dL   Comment 1 Notify RN    Comment 2 Documented in Char       Imaging Results (Last 48 hours)    Ct Head Wo Contrast  09/01/2014 CLINICAL DATA: Stroke, worsening speech, LEFT side neglect, dull RIGHT side headache EXAM: CT HEAD WITHOUT CONTRAST TECHNIQUE: Contiguous axial images were obtained from the base of the skull through the vertex without intravenous contrast. COMPARISON: 08/30/2014 FINDINGS: Normal ventricular morphology. No midline shift or mass effect. Old RIGHT frontal periventricular white matter at RIGHT caudate head infarcts. RIGHT occipital and posterior temporal infarcts. Small vessel chronic ischemic changes deep cerebral white  matter. No new areas of infarction or intracranial hemorrhage. No mass lesion or extra-axial fluid collections. Atherosclerotic calcification of carotid siphons. Bones and sinuses unremarkable. IMPRESSION: RIGHT occipital and posterior temporal infarcts. Old RIGHT frontal  periventricular white matter and RIGHT caudate head infarcts. Small vessel chronic ischemic changes of deep cerebral white matter. No significant interval change. Electronically Signed By: Lavonia Dana M.D. On: 09/01/2014 09:40        Medical Problem List and Plan: 1. Functional deficits secondary to right brain infarct with left hemiparesis. Status post loop recorder 09/03/2014 2. DVT Prophylaxis/Anticoagulation: Subcutaneous Lovenox. Monitor platelet counts and any signs of bleeding. 3. Pain Management: Tylenol as needed 4. Diabetes mellitus with peripheral neuropathy. Hemoglobin A1c 7.2. Check blood sugars before meals and at bedtime. Presently on sliding scale insulin. Patient on Glucophage 500 mg daily prior to admission. Resume as tolerated 5. Neuropsych: This patient is capable of making decisions on her own behalf. 6. Skin/Wound Care: Routine skin checks 7. Fluids/Electrolytes/Nutrition: Strict I and O's with follow-up chemistries 8. Hypertension. Presently on Tenormin 50 mg daily, hydrochlorothiazide 25 mg daily, Avapro 300 mg daily. Monitor with increased mobility. 9. Hyperlipidemia. Lipitor    Post Admission Physician Evaluation: 1. Functional deficits secondary to Right temporal occipital lobe infarct, posterior cerebral artery distribution. 2. Patient is admitted to receive collaborative, interdisciplinary care between the physiatrist, rehab nursing staff, and therapy team. 3. Patient's level of medical complexity and substantial therapy needs in context of that medical necessity cannot be provided at a lesser intensity of care such as a SNF. 4. Patient has experienced substantial functional loss from his/her baseline which was documented above under the "Functional History" and "Functional Status" headings. Judging by the patient's diagnosis, physical exam, and functional history, the patient has potential for functional progress which will result in measurable gains  while on inpatient rehab. These gains will be of substantial and practical use upon discharge in facilitating mobility and self-care at the household level. 5. Physiatrist will provide 24 hour management of medical needs as well as oversight of the therapy plan/treatment and provide guidance as appropriate regarding the interaction of the two. 6. 24 hour rehab nursing will assist with bladder management, bowel management, safety, skin/wound care, disease management, medication administration, pain management and patient education and help integrate therapy concepts, techniques,education, etc. 7. PT will assess and treat for/with: pre gait, gait training, endurance , safety, equipment, neuromuscular re education 8. . Goals are: Sup. 9. OT will assess and treat for/with: ADLs, Cognitive perceptual skills, Neuromuscular re education, safety, endurance, equipment. Goals are: Supervision. Therapy may proceed with showering this patient. 10. SLP will assess and treat for/with: Left field cut, assess higher level cognition. Goals are: Supervision. 11. Case Management and Social Worker will assess and treat for psychological issues and discharge planning. 12. Team conference will be held weekly to assess progress toward goals and to determine barriers to discharge. 13. Patient will receive at least 3 hours of therapy per day at least 5 days per week. 14. ELOS: 8-11d  15. Prognosis: good     Charlett Blake M.D. [facility] FAAPM&R (Sports Med, Neuromuscular Med) Diplomate Am Board of Electrodiagnostic Med  09/02/2014

## 2014-09-05 NOTE — Progress Notes (Signed)
Meredith Staggers, MD Physician Signed Physical Medicine and Rehabilitation Consult Note 09/02/2014 5:43 AM  Related encounter: ED to Hosp-Admission (Current) from 08/30/2014 in [place] Collapse All        Physical Medicine and Rehabilitation Consult Reason for Consult: Acute nonhemorrhagic right occipital lobe posterior inferior right temporal lobe anterior right frontal lobe Referring Physician: [facility]   HPI: Kayla [name] is a 73 y.o. right handed female with history of hypertension, diabetes mellitus and peripheral neuropathy, sickle cell trait and recent bronchitis. Independently with occasional cane prior to admission living with her husband. Admitted 08/30/2014 with altered mental status and left-sided weakness. MRI of the brain showed acute nonhemorrhagic infarct involving the right occipital lobe and posterior inferior right temporal lobe. MRA of the head showed high-grade tandem stenosis of the distal right M1 segment and associated proximal right M2 segment. Carotid Dopplers with no ICA stenosis. Patient did not receive TPA. Echocardiogram/TEE pending. Await plan for possible loop recorder implant. Neurology consulted presently on aspirin for CVA prophylaxis as well as subcutaneous Lovenox for DVT prophylaxis.   Review of Systems  Respiratory: Positive for cough.  Gastrointestinal: Positive for constipation.   GERD  Musculoskeletal: Positive for myalgias and joint pain.  Psychiatric/Behavioral:   Anxiety  All other systems reviewed and are negative.  Past Medical History  Diagnosis Date  . Asthmatic bronchitis   . Hypertension   . Venous insufficiency   . Diabetes mellitus   . Hypercholesteremia   . Diverticulosis of colon   . Constipation   . Prolapse of vaginal vault after hysterectomy   . Proteinuria   . DJD (degenerative joint disease)   . Fibromyalgia   .  Somatic dysfunction   . Anxiety   . Personal history of noncompliance with medical treatment, presenting hazards to health   . Sickle-cell trait   . Allergy     Shell fish  . GERD (gastroesophageal reflux disease)    Past Surgical History  Procedure Laterality Date  . Abdominal hysterectomy    . Robotic assisted laparoscopic sacrocolpopexy  09/2009    Dr. Matilde Sprang  . Cataract extraction w/ intraocular lens implant, bilateral  2012   Family History  Problem Relation Age of Onset  . Prostate cancer Father    Social History:  reports that she has never smoked. She has never used smokeless tobacco. She reports that she drinks alcohol. She reports that she does not use illicit drugs. Allergies:  Allergies  Allergen Reactions  . Fish Allergy Hives  . Shellfish Allergy Hives  . Penicillins     REACTION: red rash  . Tomato Rash   Medications Prior to Admission  Medication Sig Dispense Refill  . albuterol (PROVENTIL HFA;VENTOLIN HFA) 108 (90 BASE) MCG/ACT inhaler Inhale 2 puffs into the lungs every 6 (six) hours as needed for wheezing or shortness of breath.    [name] atenolol (TENORMIN) 50 MG tablet Take 1 tablet (50 mg total) by mouth daily. 3 tablet 6  . benzonatate (TESSALON) 100 MG capsule Take 1 capsule (100 mg total) by mouth 3 (three) times daily as needed for cough. 21 capsule 0  . Blood Glucose Monitoring Suppl (ACCU-CHEK AVIVA PLUS) W/DEVICE KIT Test blood sugar once daily 1 kit 0  . Diphenhyd-Hydrocort-Nystatin (FIRST-DUKES MOUTHWASH) SUSP 1 teaspoon by mouth gargle and swallow four times daily as needed 120 mL 1  . glucose blood (ACCU-CHEK AVIVA) test strip Check blood sugar once daily 100  each 4  . metFORMIN (GLUCOPHAGE) 500 MG tablet TAKE 1 TABLET DAILY WITH BREAKFAST 90 tablet 3  . olmesartan-hydrochlorothiazide (BENICAR HCT) 40-25 MG per tablet Take 1 tablet by mouth  daily.       Home: Home Living Family/patient expects to be discharged to:: Private residence Living Arrangements: Spouse/significant other Available Help at Discharge: Family Type of Home: House Home Access: Stairs to enter Technical brewer of Steps: 2 Entrance Stairs-Rails: Right, Left, Can reach both Home Layout: One level Home Equipment: Shower seat, Toilet riser  Functional History: Prior Function Level of Independence: Independent with assistive device(s) (walking without cane usually recently) Functional Status:  Mobility: Bed Mobility Overal bed mobility: Needs Assistance Bed Mobility: Supine to Sit, Sit to Supine Rolling: Min assist Supine to sit: Supervision Sit to supine: Supervision General bed mobility comments: not assesed Transfers Overall transfer level: Needs assistance Equipment used: Rolling walker (2 wheeled), 1 person hand held assist Transfers: Sit to/from Stand Sit to Stand: Min guard Stand pivot transfers: Min assist General transfer comment: assist to control descent to bed.  Ambulation/Gait Ambulation/Gait assistance: Min assist Ambulation Distance (Feet): 8 Feet Assistive device: Rolling walker (2 wheeled), 1 person hand held assist Gait Pattern/deviations: Step-through pattern, Decreased dorsiflexion - right, Decreased dorsiflexion - left, Shuffle, Wide base of support (8' x 2) Gait velocity: reduced Gait velocity interpretation: Below normal speed for age/gender    ADL: ADL Overall ADL's : Needs assistance/impaired Eating/Feeding: Minimal assistance, Sitting Grooming: Oral care, Wash/dry face, Applying deodorant, Set up, Supervision/safety, Standing Upper Body Bathing: Minimal assitance, Sitting Lower Body Bathing: Min guard (standing at sink) Lower Body Bathing Details (indicate cue type and reason): washed peri area/buttocks Upper Body Dressing : Minimal assistance, Sitting Lower Body Dressing: Minimal assistance, Sit  to/from stand Toilet Transfer: Min guard, Minimal assistance, Ambulation, RW (chair; also hand held assist) Toileting- Clothing Manipulation and Hygiene: Min guard (standing-pulled down briefs to wash bottom/peri area) Functional mobility during ADLs: Min guard, Minimal assistance, Rolling walker (walked with walker and hand held assist) General ADL Comments: Pt bumping into items on left side. Cues to turn head to left. OT placed ADL items on left side at sink and with time, pt able to locate. Pt left water running at sink requiring cue from OT to turn off. Talked with pt's daughter at end about d/c recommendation for pt/OT treatment interventions.  Cognition: Cognition Overall Cognitive Status: Impaired/Different from baseline Arousal/Alertness: Awake/alert Orientation Level: Oriented X4 Attention: Alternating Alternating Attention: Impaired Memory: (mild deficits in short-term recall) Awareness: Impaired Awareness Impairment: Intellectual impairment Problem Solving: Impaired Problem Solving Impairment: Verbal complex Safety/Judgment: (tba) Cognition Arousal/Alertness: Awake/alert Behavior During Therapy: WFL for tasks assessed/performed Overall Cognitive Status: Impaired/Different from baseline Area of Impairment: Attention, Problem solving, Memory Current Attention Level: Sustained Memory: Decreased short-term memory Problem Solving: Slow processing General Comments: pt forgetting to turn off sink water.   Blood pressure 154/70, pulse 76, temperature 98.6 F (37 C), temperature source Oral, resp. rate 16, height $RemoveBe'5\' 6"'sNqGniaff$  (1.676 m), weight 85.276 kg (188 lb), SpO2 97 %. Physical Exam  HENT:  Head: Normocephalic.  Eyes: EOM are normal.  Neck: Normal range of motion. Neck supple. No thyromegaly present.  Cardiovascular: Normal rate and regular rhythm.  Respiratory: Effort normal and breath sounds normal. No respiratory distress.  GI: Soft. Bowel sounds are normal. She exhibits  no distension.  Neurological:  Mood is flat but appropriate. She makes good eye contact with examiner. She can provide her name, age and date of  birth. Follows simple commands. Fair awareness of her deficits. LUE: 2+ to 3-/5 prox to distal. LLE: 2+ HF, 3- knee and 3+ ankle. Left central 7. speeech slightly slurred  Skin: Skin is warm and dry.  Psychiatric: She has a normal mood and affect. Her behavior is normal.     Lab Results Last 24 Hours    Results for orders placed or performed during the hospital encounter of 08/30/14 (from the past 24 hour(s))  Basic metabolic panel Status: Abnormal   Collection Time: 09/01/14 6:15 AM  Result Value Ref Range   Sodium 138 135 - 145 mmol/L   Potassium 3.5 3.5 - 5.1 mmol/L   Chloride 104 96 - 112 mmol/L   CO2 25 19 - 32 mmol/L   Glucose, Bld 154 (H) 70 - 99 mg/dL   BUN 19 6 - 23 mg/dL   Creatinine, Ser 1.09 0.50 - 1.10 mg/dL   Calcium 9.1 8.4 - 10.5 mg/dL   GFR calc non Af Amer 49 (L) >90 mL/min   GFR calc Af Amer 57 (L) >90 mL/min   Anion gap 9 5 - 15  Magnesium Status: None   Collection Time: 09/01/14 6:15 AM  Result Value Ref Range   Magnesium 2.0 1.5 - 2.5 mg/dL  Glucose, capillary Status: Abnormal   Collection Time: 09/01/14 6:45 AM  Result Value Ref Range   Glucose-Capillary 156 (H) 70 - 99 mg/dL   Comment 1 Notify RN   Glucose, capillary Status: Abnormal   Collection Time: 09/01/14 12:05 PM  Result Value Ref Range   Glucose-Capillary 167 (H) 70 - 99 mg/dL  Glucose, capillary Status: Abnormal   Collection Time: 09/01/14 4:30 PM  Result Value Ref Range   Glucose-Capillary 124 (H) 70 - 99 mg/dL      Imaging Results (Last 48 hours)    Ct Head Wo Contrast  09/01/2014 CLINICAL DATA: Stroke, worsening speech, LEFT side neglect, dull RIGHT side headache EXAM: CT HEAD WITHOUT CONTRAST TECHNIQUE: Contiguous axial  images were obtained from the base of the skull through the vertex without intravenous contrast. COMPARISON: 08/30/2014 FINDINGS: Normal ventricular morphology. No midline shift or mass effect. Old RIGHT frontal periventricular white matter at RIGHT caudate head infarcts. RIGHT occipital and posterior temporal infarcts. Small vessel chronic ischemic changes deep cerebral white matter. No new areas of infarction or intracranial hemorrhage. No mass lesion or extra-axial fluid collections. Atherosclerotic calcification of carotid siphons. Bones and sinuses unremarkable. IMPRESSION: RIGHT occipital and posterior temporal infarcts. Old RIGHT frontal periventricular white matter and RIGHT caudate head infarcts. Small vessel chronic ischemic changes of deep cerebral white matter. No significant interval change. Electronically Signed By: Lavonia Dana M.D. On: 09/01/2014 09:40     Assessment/Plan: Diagnosis: right brain infarcts with left hemiparesis 1. Does the need for close, 24 hr/day medical supervision in concert with the patient's rehab needs make it unreasonable for this patient to be served in a less intensive setting? Yes 2. Co-Morbidities requiring supervision/potential complications: dm2, aki 3. Due to bladder management, bowel management, safety, skin/wound care, disease management, medication administration, pain management and patient education, does the patient require 24 hr/day rehab nursing? Yes 4. Does the patient require coordinated care of a physician, rehab nurse, PT (1-2 hrs/day, 5 days/week), OT (1-2 hrs/day, 5 days/week) and SLP (1-2 hrs/day, 5 days/week) to address physical and functional deficits in the context of the above medical diagnosis(es)? Yes Addressing deficits in the following areas: balance, endurance, locomotion, strength, transferring, bowel/bladder control, bathing, dressing, feeding, grooming, toileting,  speech and psychosocial support 5. Can the patient  actively participate in an intensive therapy program of at least 3 hrs of therapy per day at least 5 days per week? Yes 6. The potential for patient to make measurable gains while on inpatient rehab is excellent 7. Anticipated functional outcomes upon discharge from inpatient rehab are modified independent with PT, modified independent with OT, modified independent with SLP. 8. Estimated rehab length of stay to reach the above functional goals is: 7-10 days 9. Does the patient have adequate social supports and living environment to accommodate these discharge functional goals? Yes 10. Anticipated D/C setting: Home 11. Anticipated post D/C treatments: HH therapy and Outpatient therapy 12. Overall Rehab/Functional Prognosis: excellent  RECOMMENDATIONS: This patient's condition is appropriate for continued rehabilitative care in the following setting: CIR Patient has agreed to participate in recommended program. Yes Note that insurance prior authorization may be required for reimbursement for recommended care.  Comment: Rehab Admissions Coordinator to follow up.  Thanks,  Meredith Staggers, MD, [facility]     09/02/2014       Revision History     Date/Time User Provider Type Action   09/02/2014 9:37 AM Meredith Staggers, MD Physician Sign   09/02/2014 6:32 AM Kayla Parsons, PA-C Physician Assistant Pend   View Details Report       Routing History     Date/Time From To Method   09/02/2014 9:37 AM Meredith Staggers, MD Meredith Staggers, MD In Basket   09/02/2014 9:37 AM Meredith Staggers, MD Olga Millers, MD In [facility]

## 2014-09-05 NOTE — Progress Notes (Signed)
Discussed transfer to the Inpatient Rehab unit with patient and family members present. Removed telemetry and IV. Pt was transferred to [id number] via wheelchair at 1610. Report given to Ed, Therapist, sports.

## 2014-09-06 ENCOUNTER — Inpatient Hospital Stay (HOSPITAL_COMMUNITY): Payer: Medicare HMO | Admitting: Physical Therapy

## 2014-09-06 ENCOUNTER — Inpatient Hospital Stay (HOSPITAL_COMMUNITY): Payer: Medicare HMO | Admitting: Speech Pathology

## 2014-09-06 ENCOUNTER — Inpatient Hospital Stay (HOSPITAL_COMMUNITY): Payer: Medicare HMO | Admitting: Occupational Therapy

## 2014-09-06 ENCOUNTER — Other Ambulatory Visit: Payer: Medicare HMO

## 2014-09-06 DIAGNOSIS — I639 Cerebral infarction, unspecified: Secondary | ICD-10-CM

## 2014-09-06 DIAGNOSIS — G8190 Hemiplegia, unspecified affecting unspecified side: Secondary | ICD-10-CM

## 2014-09-06 DIAGNOSIS — H53462 Homonymous bilateral field defects, left side: Secondary | ICD-10-CM

## 2014-09-06 LAB — CBC WITH DIFFERENTIAL/PLATELET
BASOS PCT: 0 % (ref 0–1)
Basophils Absolute: 0 10*3/uL (ref 0.0–0.1)
Eosinophils Absolute: 0.3 10*3/uL (ref 0.0–0.7)
Eosinophils Relative: 6 % — ABNORMAL HIGH (ref 0–5)
HEMATOCRIT: 45.3 % (ref 36.0–46.0)
HEMOGLOBIN: 14.9 g/dL (ref 12.0–15.0)
LYMPHS ABS: 2.1 10*3/uL (ref 0.7–4.0)
Lymphocytes Relative: 39 % (ref 12–46)
MCH: 29 pg (ref 26.0–34.0)
MCHC: 32.9 g/dL (ref 30.0–36.0)
MCV: 88.3 fL (ref 78.0–100.0)
MONOS PCT: 12 % (ref 3–12)
Monocytes Absolute: 0.7 10*3/uL (ref 0.1–1.0)
NEUTROS ABS: 2.3 10*3/uL (ref 1.7–7.7)
Neutrophils Relative %: 43 % (ref 43–77)
Platelets: 228 10*3/uL (ref 150–400)
RBC: 5.13 MIL/uL — ABNORMAL HIGH (ref 3.87–5.11)
RDW: 15.8 % — ABNORMAL HIGH (ref 11.5–15.5)
WBC: 5.4 10*3/uL (ref 4.0–10.5)

## 2014-09-06 LAB — COMPREHENSIVE METABOLIC PANEL
ALK PHOS: 67 U/L (ref 39–117)
ALT: 20 U/L (ref 0–35)
ANION GAP: 7 (ref 5–15)
AST: 21 U/L (ref 0–37)
Albumin: 3.4 g/dL — ABNORMAL LOW (ref 3.5–5.2)
BILIRUBIN TOTAL: 0.6 mg/dL (ref 0.3–1.2)
BUN: 21 mg/dL (ref 6–23)
CHLORIDE: 103 mmol/L (ref 96–112)
CO2: 29 mmol/L (ref 19–32)
Calcium: 9.2 mg/dL (ref 8.4–10.5)
Creatinine, Ser: 1.3 mg/dL — ABNORMAL HIGH (ref 0.50–1.10)
GFR calc Af Amer: 46 mL/min — ABNORMAL LOW (ref >90)
GFR calc non Af Amer: 39 mL/min — ABNORMAL LOW (ref >90)
Glucose, Bld: 137 mg/dL — ABNORMAL HIGH (ref 70–99)
POTASSIUM: 3.8 mmol/L (ref 3.5–5.1)
Sodium: 139 mmol/L (ref 135–145)
Total Protein: 7.1 g/dL (ref 6.0–8.3)

## 2014-09-06 LAB — GLUCOSE, CAPILLARY
GLUCOSE-CAPILLARY: 147 mg/dL — AB (ref 70–99)
GLUCOSE-CAPILLARY: 155 mg/dL — AB (ref 70–99)
GLUCOSE-CAPILLARY: 157 mg/dL — AB (ref 70–99)
Glucose-Capillary: 217 mg/dL — ABNORMAL HIGH (ref 70–99)

## 2014-09-06 NOTE — Evaluation (Signed)
Speech Language Pathology Assessment and Plan  Patient Details  Name: Kayla [name] MRN: [id number] Date of Birth: Jul 29, 1940  SLP Diagnosis: Cognitive Impairments  Rehab Potential: Good ELOS: 10-14 days     Today's Date: 09/06/2014 SLP Individual Time: 0800-0900 SLP Individual Time Calculation (min): 60 min   Problem List:  Patient Active Problem List   Diagnosis Date Noted  . Left homonymous hemianopsia 09/06/2014  . Occipital infarction 09/05/2014  . AKI (acute kidney injury)   . Confusion   . Altered mental status 08/30/2014  . Acute ischemic right MCA stroke 08/30/2014  . Acute right PCA stroke 08/30/2014  . Abnormal chest x-ray 08/22/2014  . Type 2 diabetes mellitus with diabetic neuropathy 01/04/2014  . HYPERCHOLESTEROLEMIA, MILD 05/22/2009  . PERS HX NONCOMPLIANCE W/MED TX PRS HAZARDS HLTH 07/11/2007  . Essential hypertension 05/04/2007  . Osteoarthritis 05/04/2007  . Fibromyalgia 05/04/2007   Past Medical History:  Past Medical History  Diagnosis Date  . Asthmatic bronchitis   . Hypertension   . Venous insufficiency   . Diabetes mellitus   . Hypercholesteremia   . Diverticulosis of colon   . Constipation   . Prolapse of vaginal vault after hysterectomy   . Proteinuria   . DJD (degenerative joint disease)   . Fibromyalgia   . Somatic dysfunction   . Anxiety   . Personal history of noncompliance with medical treatment, presenting hazards to health   . Sickle-cell trait   . Allergy     Shell fish  . GERD (gastroesophageal reflux disease)    Past Surgical History:  Past Surgical History  Procedure Laterality Date  . Abdominal hysterectomy    . Robotic assisted laparoscopic sacrocolpopexy  09/2009    Dr. Matilde Sprang  . Cataract extraction w/ intraocular lens  implant, bilateral  2012  . Tee without cardioversion N/A 09/02/2014    Procedure: TRANSESOPHAGEAL ECHOCARDIOGRAM (TEE);  Surgeon: Lelon Perla, MD;  Location: [facility] ENDOSCOPY;  Service:  Cardiovascular;  Laterality: N/A;  . Loop recorder implant N/A 09/03/2014    Procedure: LOOP RECORDER IMPLANT;  Surgeon: Thompson Grayer, MD;  Location: [facility] CATH LAB;  Service: Cardiovascular;  Laterality: N/A;    Assessment / Plan / Recommendation Clinical Impression Kayla [name] is a 74 y.o. right handed female with history of hypertension, diabetes mellitus and peripheral neuropathy, sickle cell trait and recent bronchitis. Independently with occasional cane prior to admission living with her husband. Admitted 08/30/2014 with altered mental status and left-sided weakness. MRI of the brain showed acute nonhemorrhagic infarct involving the right occipital lobe and posterior inferior right temporal lobe. MRA of the head showed high-grade tandem stenosis of the distal right M1 segment and associated proximal right M2 segment. Carotid Dopplers with no ICA stenosis. Patient did not receive TPA. TEE 09/02/2014 with normal LV function, negative saline cavitation study. Neurology consulted presently on aspirin for CVA prophylaxis as well as subcutaneous Lovenox for DVT prophylaxis. Underwent implantable loop recorder 09/03/2014 per Dr. Rayann Heman. Physical and occupational therapy evaluations completed with recommendations of physical medicine rehabilitation consult.  Patient was admitted for comprehensive rehabilitation program on 09/05/2014 and was administered a cognitive-linguistic assessment. Patient exhibits mild cognitive deficits characterized by deficits in alternating attention, functional problem-solving, short-term memory, and safety awareness. Patient demonstrating left field inattention, question further visual field deficits and recommend further diagnostic treatment. Additionally, patient demonstrates some intellectual awareness of cognitive deficits, but exhibits impaired emerging awareness for correcting errors in the moment. These deficits impact her ability to complete familiar and  functional tasks  safely; as patient was independent PTA, recommend patient receive skilled SLP intervention to maximize cognitive functioning and functional independence to decrease caregiver burden prior to discharge. Anticipate that patient will need 24 hour supervision at discharge.  Skilled Therapeutic Interventions          Patient was administered a cognitive-linguistic evaluation; see above for further information. Patient benefited from Mod A verbal cues for attention to the left of environment during evaluation, and demonstrated other intermittent visual difficulties during functional money-counting task. Results and recommendations reviewed with patient and family; they verbalized understanding but will require further reinforcement   SLP Assessment  Patient will need skilled [facility] during CIR admission    Recommendations  Oral Care Recommendations: Oral care BID Patient destination: Home Follow up Recommendations: Outpatient SLP;24 hour supervision/assistance;Home Health SLP Equipment Recommended: None recommended by SLP    SLP Frequency 3 to 5 out of 7 days   SLP Treatment/Interventions Cueing hierarchy;Cognitive remediation/compensation;Environmental controls;Functional tasks;Therapeutic Activities;Internal/external aids;Patient/family education    Pain Pain Assessment Pain Assessment: No/denies pain Prior Functioning Type of Home: House  Lives With: Spouse Available Help at Discharge: Family;Other (Comment) (dtr is very supportive)  Short Term Goals: Week 1: SLP Short Term Goal 1 (Week 1): Patient will demonstrate basic problem-solving in 75% of observable opportunities given Mod A verbal, visual, and tactile cues. SLP Short Term Goal 2 (Week 1): Patient will utilize external aids to assist in recall of new, daily information over 3 consecutive sessions given Min A verbal and visual cues. SLP Short Term Goal 3 (Week 1): Patient will demonstrate alternating  attention between functional tasks in a quiet environment for 5 minutes given Min A verbal and visual cues. SLP Short Term Goal 4 (Week 1): Patient will self-monitor and correct for cognitive deficits during basic self-care and home management tasks given Min A question cues. SLP Short Term Goal 5 (Week 1): Patient will attend to left of environment in 75% of observable opportunities given Min A verbal, visual, and tactile cues.  See FIM for current functional status Refer to Care Plan for Long Term Goals  Recommendations for other services: None  Discharge Criteria: Patient will be discharged from SLP if patient refuses treatment 3 consecutive times without medical reason, if treatment goals not met, if there is a change in medical status, if patient makes no progress towards goals or if patient is discharged from hospital.  The above assessment, treatment plan, treatment alternatives and goals were discussed and mutually agreed upon: by patient and by family  Servando Snare 09/06/2014, 3:25 PM

## 2014-09-06 NOTE — Progress Notes (Signed)
Patient information reviewed and entered into eRehab system by Valentina Alcoser, RN, CRRN, PPS Coordinator.  Information including medical coding and functional independence measure will be reviewed and updated through discharge.     Per nursing patient was given "Data Collection Information Summary for Patients in Inpatient Rehabilitation Facilities with attached "Privacy Act Statement-Health Care Records" upon admission.  

## 2014-09-06 NOTE — Progress Notes (Signed)
Social Work Assessment and Plan Social Work Assessment and Plan  Patient Details  Name: Kayla [name] MRN: [id number] Date of Birth: [birth year]  Today's Date: 09/06/2014  Problem List:  Patient Active Problem List   Diagnosis Date Noted  . Left homonymous hemianopsia 09/06/2014  . Occipital infarction 09/05/2014  . AKI (acute kidney injury)   . Confusion   . Altered mental status 08/30/2014  . Acute ischemic right MCA stroke 08/30/2014  . Acute right PCA stroke 08/30/2014  . Abnormal chest x-ray 08/22/2014  . Type 2 diabetes mellitus with diabetic neuropathy 01/04/2014  . HYPERCHOLESTEROLEMIA, MILD 05/22/2009  . PERS HX NONCOMPLIANCE W/MED TX PRS HAZARDS HLTH 07/11/2007  . Essential hypertension 05/04/2007  . Osteoarthritis 05/04/2007  . Fibromyalgia 05/04/2007   Past Medical History:  Past Medical History  Diagnosis Date  . Asthmatic bronchitis   . Hypertension   . Venous insufficiency   . Diabetes mellitus   . Hypercholesteremia   . Diverticulosis of colon   . Constipation   . Prolapse of vaginal vault after hysterectomy   . Proteinuria   . DJD (degenerative joint disease)   . Fibromyalgia   . Somatic dysfunction   . Anxiety   . Personal history of noncompliance with medical treatment, presenting hazards to health   . Sickle-cell trait   . Allergy     Shell fish  . GERD (gastroesophageal reflux disease)    Past Surgical History:  Past Surgical History  Procedure Laterality Date  . Abdominal hysterectomy    . Robotic assisted laparoscopic sacrocolpopexy  09/2009    Dr. Matilde Sprang  . Cataract extraction w/ intraocular lens  implant, bilateral  2012  . Tee without cardioversion N/A 09/02/2014    Procedure: TRANSESOPHAGEAL ECHOCARDIOGRAM (TEE);  Surgeon: Lelon Perla, MD;  Location: [facility] ENDOSCOPY;  Service: Cardiovascular;  Laterality: N/A;  . Loop recorder implant N/A 09/03/2014    Procedure: LOOP RECORDER IMPLANT;  Surgeon: Thompson Grayer, MD;  Location: [facility]  CATH LAB;  Service: Cardiovascular;  Laterality: N/A;   Social History:  reports that she has never smoked. She has never used smokeless tobacco. She reports that she drinks alcohol. She reports that she does not use illicit drugs.  Family / Support Systems Marital Status: Married Patient Roles: Spouse, Parent Spouse/Significant Other: Herbie [place]  [phone] Children: [name]  [phone] Other Supports: Three son's Anticipated Caregiver: husband and daughter Ability/Limitations of Caregiver: husband is in good health and daughter is very involved and close by Caregiver Availability: 24/7 Family Dynamics: Close knit family husband and daughter are always here to provide support to pt and observe her in therapies.  Pt feels very grateful to have all of them.  She reports her boys do visit after work. Pt is ready for rehab  Social History Preferred language: English Religion: Baptist Cultural Background: No issues Education: High School Read: Yes Write: Yes Employment Status: Retired Freight forwarder Issues: No issues Guardian/Conservator: None-according to MD pt is capable of making her own decisions while here.  Will make sure her husband or daughter is here also.   Abuse/Neglect Physical Abuse: Denies Verbal Abuse: Denies Sexual Abuse: Denies Exploitation of patient/patient's resources: Denies Self-Neglect: Denies  Emotional Status Pt's affect, behavior adn adjustment status: Pt is motivated to improve and regain her independence once again.  She does not like asking others for assist and is not used to this.  She would rather do it on her own.  She is tired from am therapies but it is a  good tired.  Husband and daughter are here to observe. Recent Psychosocial Issues: Other medical issues-thought they were managed but the bronchitis she got prior to her stroke was bad and made her weak. Pyschiatric History: History of anxiety-takes meds for this and feel they  help her.  Deferred depression screen due to feeling doing ok wiht all of this and family agrees with this assessment.  Will montior and intervene if needed throughout stay Substance Abuse History: No issues  Patient / Family Perceptions, Expectations & Goals Pt/Family understanding of illness & functional limitations: Pt and family have a good udnerstanding of her stroke and deficits.  She is hopeful she will do well and recover from this.  Thye speak with the MD daily and feel their questions and concerns are being addressed. Premorbid pt/family roles/activities: Wife, Mother, grandmother, retiree, church member, etc Anticipated changes in roles/activities/participation: resume Pt/family expectations/goals: Pt states: " I want to be able to do for myself when I leave here."  Husband states: " My daughter will help with whatever she needs she is always here."  Daughter states: " I am hopeful Mom will do well and be mobile."  [employer]: None Premorbid Home Care/DME Agencies: None Transportation available at discharge: [employer] referrals recommended: Neuropsychology, Support group (specify)  Discharge Planning Living Arrangements: Spouse/significant other Support Systems: Spouse/significant other, Children, Other relatives, Friends/neighbors, Social worker community Type of Residence: Private residence Insurance Resources: Multimedia programmer (specify) Doctor, general practice) Museum/gallery curator Resources: Fish farm manager, Family Support Financial Screen Referred: No Living Expenses: Lives with family Money Management: Spouse, Patient Does the patient have any problems obtaining your medications?: No Home Management: Family Patient/Family Preliminary Plans: Return home with husband and daughter to assist also.  Husband is in good health and can assist.  Daughter is close by and fleixible with her schedule so can also help if needed.  Await team's evalutaions and  goals. Social Work Anticipated Follow Up Needs: HH/OP, Support Group  Clinical Impression Pleasant female who is willing to work hard in therapies to regain her independence.  Supportive husband and daughter were here to observe in therapies and see her progress. Pt was tired from am therapies but felt this was good.  Will work on safe discharge plan and await team's evaluations.  Elease Hashimoto 09/06/2014, 2:03 PM

## 2014-09-06 NOTE — Care Management Note (Signed)
[facility] Individual Statement of Services  Patient Name:  Kayla [name]  Date:  09/06/2014  Welcome to the [facility].  Our goal is to provide you with an individualized program based on your diagnosis and situation, designed to meet your specific needs.  With this comprehensive rehabilitation program, you will be expected to participate in at least 3 hours of rehabilitation therapies Monday-Friday, with modified therapy programming on the weekends.  Your rehabilitation program will include the following services:  Physical Therapy (PT), Occupational Therapy (OT), Speech Therapy (ST), 24 hour per day rehabilitation nursing, Therapeutic Recreaction (TR), Case Management (Social Worker), Rehabilitation Medicine, Nutrition Services and Pharmacy Services  Weekly team conferences will be held on Wenesday to discuss your progress.  Your Social Worker will talk with you frequently to get your input and to update you on team discussions.  Team conferences with you and your family in attendance may also be held.  Expected length of stay: 10-14 days   Overall anticipated outcome: supervision cueing  Depending on your progress and recovery, your program may change. Your Social Worker will coordinate services and will keep you informed of any changes. Your Social Worker's name and contact numbers are listed  below.  The following services may also be recommended but are not provided by the [facility] will be made to provide these services after discharge if needed.  Arrangements include referral to agencies that provide these services.  Your insurance has been verified to be:  [employer] Your primary doctor is:  Vertell Novak  Pertinent information will be shared with your doctor and your insurance company.  Social  Worker:  Ovidio Kin, [place] or ([phone]  Information discussed with and copy given to patient by: Elease Hashimoto, 09/06/2014, 1:35 PM

## 2014-09-06 NOTE — Progress Notes (Signed)
74 y.o. right handed female with history of hypertension, diabetes mellitus and peripheral neuropathy, sickle cell trait and recent bronchitis. Independently with occasional cane prior to admission living with her husband. Admitted 08/30/2014 with altered mental status and left-sided weakness. MRI of the brain showed acute nonhemorrhagic infarct involving the right occipital lobe and posterior inferior right temporal lobe. MRA of the head showed high-grade tandem stenosis of the distal right M1 segment and associated proximal right M2 segment. Carotid Dopplers with no ICA stenosis. Patient did not receive TPA. TEE 09/02/2014 with normal LV function, negative saline cavitation study. Subjective/Complaints:   Objective: Vital Signs: Blood pressure 131/68, pulse 74, temperature 98.5 F (36.9 C), temperature source Oral, resp. rate 18, height $RemoveBe'5\' 6"'[id number]$  (1.676 m), weight 89 kg (196 lb 3.4 oz), SpO2 100 %. No results found. Results for orders placed or performed during the hospital encounter of 09/05/14 (from the past 72 hour(s))  Glucose, capillary     Status: Abnormal   Collection Time: 09/05/14  5:15 PM  Result Value Ref Range   Glucose-Capillary 148 (H) 70 - 99 mg/dL  CBC     Status: Abnormal   Collection Time: 09/05/14  7:44 PM  Result Value Ref Range   WBC 4.8 4.0 - 10.5 K/uL   RBC 5.08 3.87 - 5.11 MIL/uL   Hemoglobin 14.8 12.0 - 15.0 g/dL   HCT 45.1 36.0 - 46.0 %   MCV 88.8 78.0 - 100.0 fL   MCH 29.1 26.0 - 34.0 pg   MCHC 32.8 30.0 - 36.0 g/dL   RDW 15.8 (H) 11.5 - 15.5 %   Platelets 223 150 - 400 K/uL  Creatinine, serum     Status: Abnormal   Collection Time: 09/05/14  7:44 PM  Result Value Ref Range   Creatinine, Ser 1.35 (H) 0.50 - 1.10 mg/dL   GFR calc non Af Amer 38 (L) >90 mL/min   GFR calc Af Amer 44 (L) >90 mL/min    Comment: (NOTE) The eGFR has been calculated using the CKD EPI equation. This calculation has not been validated in all clinical situations. eGFR's persistently  <90 mL/min signify possible Chronic Kidney Disease.   Glucose, capillary     Status: Abnormal   Collection Time: 09/05/14  9:11 PM  Result Value Ref Range   Glucose-Capillary 175 (H) 70 - 99 mg/dL  Glucose, capillary     Status: Abnormal   Collection Time: 09/06/14  6:54 AM  Result Value Ref Range   Glucose-Capillary 147 (H) 70 - 99 mg/dL     HEENT: normal Cardio: RRR and no murmur Resp: CTA B/L and unlabored GI: BS positive and NT, ND Extremity:  Pulses positive and No Edema Skin:   Intact Neuro: Alert/Oriented, Normal Sensory, Abnormal Motor 3-/5 L Bi, Tri,Grip, HF, 4/5 KE,  5/5 on right and Other Left Homonymous hemianopsia Musc/Skel:  Normal and Other no pain with AROM BUE and BLE Gen NAD   Assessment/Plan: 1. Functional deficits secondary to Right temporal occipital lobe infarct, posterior cerebral artery distribution which require 3+ hours per day of interdisciplinary therapy in a comprehensive inpatient rehab setting. Physiatrist is providing close team supervision and 24 hour management of active medical problems listed below. Physiatrist and rehab team continue to assess barriers to discharge/monitor patient progress toward functional and medical goals. FIM:                   Comprehension Comprehension Mode: Auditory Comprehension: 5-Follows basic conversation/direction: With extra time/assistive device  Expression  Expression Mode: Verbal Expression: 5-Expresses basic needs/ideas: With extra time/assistive device  Social Interaction Social Interaction: 7-Interacts appropriately with others - No medications needed.  Problem Solving Problem Solving Mode: Not assessed  Memory Memory Mode: Not assessed  Medical Problem List and Plan: 1. Functional deficits secondary to right brain infarct with left hemiparesis. Status post loop recorder 09/03/2014 2. DVT Prophylaxis/Anticoagulation: Subcutaneous Lovenox. Monitor platelet counts and any signs of  bleeding. 3. Pain Management: Tylenol as needed 4. Diabetes mellitus with peripheral neuropathy. Hemoglobin A1c 7.2. Check blood sugars before meals and at bedtime. Presently on sliding scale insulin. Patient on Glucophage 500 mg daily prior to admission. Resume as tolerated, elevated creat will hold off for now, consider amaryl 5. Neuropsych: This patient is capable of making decisions on her own behalf. 6. Skin/Wound Care: Routine skin checks 7. Fluids/Electrolytes/Nutrition: Strict I and O's with follow-up chemistries 8. Hypertension. Presently on Tenormin 50 mg daily, hydrochlorothiazide 25 mg daily, Avapro 300 mg daily. Monitor with increased mobility. 9. Hyperlipidemia. Lipitor  LOS (Days) 1 A FACE TO FACE EVALUATION WAS PERFORMED  Kayla [name] 09/06/2014, 7:46 AM

## 2014-09-06 NOTE — Evaluation (Signed)
Occupational Therapy Assessment and Plan  Patient Details  Name: Kayla [name] MRN: [id number] Date of Birth: 09-25-1940  OT Diagnosis: cognitive deficits, disturbance of vision, hemiplegia affecting non-dominant side and muscle weakness (generalized) Rehab Potential: Rehab Potential (ACUTE ONLY): Good ELOS: 10-14 days   Today's Date: 09/06/2014 OT Individual Time: 4098-1191 OT Individual Time Calculation (min): 60 min     Problem List:  Patient Active Problem List   Diagnosis Date Noted  . Left homonymous hemianopsia 09/06/2014  . Occipital infarction 09/05/2014  . AKI (acute kidney injury)   . Confusion   . Altered mental status 08/30/2014  . Acute ischemic right MCA stroke 08/30/2014  . Acute right PCA stroke 08/30/2014  . Abnormal chest x-ray 08/22/2014  . Type 2 diabetes mellitus with diabetic neuropathy 01/04/2014  . HYPERCHOLESTEROLEMIA, MILD 05/22/2009  . PERS HX NONCOMPLIANCE W/MED TX PRS HAZARDS HLTH 07/11/2007  . Essential hypertension 05/04/2007  . Osteoarthritis 05/04/2007  . Fibromyalgia 05/04/2007    Past Medical History:  Past Medical History  Diagnosis Date  . Asthmatic bronchitis   . Hypertension   . Venous insufficiency   . Diabetes mellitus   . Hypercholesteremia   . Diverticulosis of colon   . Constipation   . Prolapse of vaginal vault after hysterectomy   . Proteinuria   . DJD (degenerative joint disease)   . Fibromyalgia   . Somatic dysfunction   . Anxiety   . Personal history of noncompliance with medical treatment, presenting hazards to health   . Sickle-cell trait   . Allergy     Shell fish  . GERD (gastroesophageal reflux disease)    Past Surgical History:  Past Surgical History  Procedure Laterality Date  . Abdominal hysterectomy    . Robotic assisted laparoscopic sacrocolpopexy  09/2009    Dr. Matilde Sprang  . Cataract extraction w/ intraocular lens  implant, bilateral  2012  . Tee without cardioversion N/A 09/02/2014    Procedure:  TRANSESOPHAGEAL ECHOCARDIOGRAM (TEE);  Surgeon: Lelon Perla, MD;  Location: [facility] ENDOSCOPY;  Service: Cardiovascular;  Laterality: N/A;  . Loop recorder implant N/A 09/03/2014    Procedure: LOOP RECORDER IMPLANT;  Surgeon: Thompson Grayer, MD;  Location: [facility] CATH LAB;  Service: Cardiovascular;  Laterality: N/A;    Assessment & Plan Clinical Impression: Patient is a 74 y.o. right handed female with history of hypertension, diabetes mellitus and peripheral neuropathy, sickle cell trait and recent bronchitis. Independently with occasional cane prior to admission living with her husband. Admitted 08/30/2014 with altered mental status and left-sided weakness. MRI of the brain showed acute nonhemorrhagic infarct involving the right occipital lobe and posterior inferior right temporal lobe. MRA of the head showed high-grade tandem stenosis of the distal right M1 segment and associated proximal right M2 segment. Carotid Dopplers with no ICA stenosis. Patient did not receive TPA. TEE 09/02/2014 with normal LV function, negative saline cavitation study. Neurology consulted presently on aspirin for CVA prophylaxis as well as subcutaneous Lovenox for DVT prophylaxis. Underwent implantable loop recorder 09/03/2014 per Dr. Rayann Heman. Physical and occupational therapy evaluations completed with recommendations of physical medicine rehabilitation consult.  Patient transferred to CIR on 09/05/2014 .    Patient currently requires mod with basic self-care skills secondary to muscle weakness, unbalanced muscle activation and decreased coordination, decreased visual motor skills and field cut, decreased problem solving, decreased safety awareness and decreased memory and decreased standing balance, decreased postural control and hemiplegia.  Prior to hospitalization, patient could complete ADLs with modified independent .  Patient  will benefit from skilled intervention to increase independence with basic self-care skills and  increase level of independence with iADL prior to discharge home with care partner.  Anticipate patient will require 24 hour supervision and follow up outpatient.  OT - End of Session Activity Tolerance: Tolerates 30+ min activity without fatigue Endurance Deficit: No OT Assessment Rehab Potential (ACUTE ONLY): Good OT Patient demonstrates impairments in the following area(s): Balance;Cognition;Motor;Safety;Vision OT Basic ADL's Functional Problem(s): Grooming;Bathing;Dressing;Toileting OT Advanced ADL's Functional Problem(s): Simple Meal Preparation OT Transfers Functional Problem(s): Toilet;Tub/Shower OT Additional Impairment(s): Fuctional Use of Upper Extremity OT Plan OT Intensity: Minimum of 1-2 x/day, 45 to 90 minutes OT Frequency: 5 out of 7 days OT Duration/Estimated Length of Stay: 10-14 days OT Treatment/Interventions: Balance/vestibular training;Cognitive remediation/compensation;Discharge planning;DME/adaptive equipment instruction;Disease mangement/prevention;Functional mobility training;Neuromuscular re-education;Pain management;Patient/family education;Psychosocial support;Self Care/advanced ADL retraining;Therapeutic Activities;Therapeutic Exercise;UE/LE Strength taining/ROM;UE/LE Coordination activities;Visual/perceptual remediation/compensation OT Self Feeding Anticipated Outcome(s): Mod I OT Basic Self-Care Anticipated Outcome(s): supervision OT Toileting Anticipated Outcome(s): supervision OT Bathroom Transfers Anticipated Outcome(s): supervision OT Recommendation Patient destination: Home Follow Up Recommendations: Outpatient OT Equipment Recommended: Tub/shower bench   Skilled Therapeutic Intervention OT eval completed with discussion of OT purpose, plan of care, and goals.  ADL assessment completed at sit > stand level in room shower with pt requiring min/steady assist with standing balance.  Mod-max verbal cues for scanning to Lt to obtain items throughout  self-care tasks. Therapist positioned to pt's Lt to promote spontaneous scanning to Lt.  Encouraged family to sit on pt's left to further promote scanning to and attention to Lt side.    OT Evaluation Precautions/Restrictions  Precautions Precautions: Fall General   Vital Signs Therapy Vitals Temp: 98.7 F (37.1 C) Temp Source: Oral Pulse Rate: 66 Resp: 18 BP: 138/68 mmHg Patient Position (if appropriate): Sitting Oxygen Therapy SpO2: 99 % Pain Pain Assessment Pain Assessment: No/denies pain Home Living/Prior Functioning Home Living Living Arrangements: Spouse/significant other Available Help at Discharge: Family, Other (Comment) (dtr is very supportive) Type of Home: Mudlogger of Steps: 3 (2 in front; 3 in back (main entrance) and pt able tio reach both rails) Entrance Stairs-Rails: Right, Left, Can reach both Home Layout: Two level Additional Comments: pt enters home through back door, which has 3 steps with 2 rails. 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 Leisure: Hobbies-yes (Comment) (Likes to watch TV) Comments: Pt owns rolling walker and standard cane/ ADL  See FIM Vision/Perception  Vision- History Baseline Vision/History: Wears glasses Wears Glasses: Reading only Vision- Assessment Vision Assessment?: Yes Tracking/Visual Pursuits: Right eye does not track medially;Decreased smoothness of horizontal tracking;Requires cues, head turns, or add eye shifts to track Saccades: Additional eye shifts occurred during  testing;Additional head turns occurred during testing Visual Fields: Left visual field deficit  Cognition Overall Cognitive Status: Impaired/Different from baseline Arousal/Alertness: Awake/alert Orientation Level: Oriented X4 Attention: Alternating Alternating Attention: Impaired Alternating Attention Impairment: Verbal complex;Functional complex Memory: Impaired Memory Impairment: Decreased recall of new information;Decreased short term memory Decreased Short Term Memory: Verbal complex;Functional basic Awareness: Impaired Awareness Impairment: Emergent impairment Problem Solving: Impaired Problem Solving Impairment: Functional basic;Verbal complex Behaviors: Impulsive Safety/Judgment: Impaired Comments: nurse  tech reported that patient had tried to get up without assist prior to start of session  Sensation Sensation Light Touch: Impaired by gross assessment (reports dull sensation in LUE) Stereognosis: Not tested Hot/Cold: Not tested Coordination Fine Motor Movements are Fluid and Coordinated: No Finger Nose Finger Test: mildly impaired Lt when compared to Rt Extremity/Trunk Assessment RUE Assessment RUE Assessment: Within Functional Limits LUE Assessment LUE Assessment: Exceptions to [facility] (strength grossly 2+ to 3- proximal to distal)  FIM:  FIM - Eating Eating Activity: 7: Complete independence:no helper FIM - Grooming Grooming Steps: Wash, rinse, dry face;Wash, rinse, dry hands;Oral care, brush teeth, clean dentures Grooming: 4: Steadying assist  or patient completes 3 of 4 or 4 of 5 steps FIM - Bathing Bathing Steps Patient Completed: Chest;Right Arm;Left Arm;Abdomen;Front perineal area;Buttocks;Right upper leg;Left upper leg;Right lower leg (including foot);Left lower leg (including foot) Bathing: 4: Steadying assist FIM - Upper Body Dressing/Undressing Upper body dressing/undressing steps patient completed: Thread/unthread right bra strap;Thread/unthread right  sleeve of pullover shirt/dresss;Thread/unthread left sleeve of pullover shirt/dress;Put head through opening of pull over shirt/dress;Pull shirt over trunk Upper body dressing/undressing: 3: Mod-Patient completed 50-74% of tasks FIM - Lower Body Dressing/Undressing Lower body dressing/undressing steps patient completed: Thread/unthread right underwear leg;Thread/unthread left underwear leg;Pull underwear up/down;Thread/unthread right pants leg;Thread/unthread left pants leg;Pull pants up/down Lower body dressing/undressing: 3: Mod-Patient completed 50-74% of tasks FIM - Toileting Toileting steps completed by patient: Adjust clothing prior to toileting;Performs perineal hygiene;Adjust clothing after toileting Toileting Assistive Devices: Grab bar or rail for support Toileting: 6: Assistive device: No helper FIM - Radio producer Devices: Insurance account manager Transfers: 4-To toilet/BSC: Min A (steadying Pt. > 75%);4-From toilet/BSC: Min A (steadying Pt. > 75%) FIM - Systems developer Devices: Shower chair;Grab bars;Walk in shower Tub/shower Transfers: 4-Into Tub/Shower: Min A (steadying Pt. > 75%/lift 1 leg);4-Out of Tub/Shower: Min A (steadying Pt. > 75%/lift 1 leg)   Refer to Care Plan for Long Term Goals  Recommendations for other services: None  Discharge Criteria: Patient will be discharged from OT if patient refuses treatment 3 consecutive times without medical reason, if treatment goals not met, if there is a change in medical status, if patient makes no progress towards goals or if patient is discharged from hospital.  The above assessment, treatment plan, treatment alternatives and goals were discussed and mutually agreed upon: by patient and by family  [facility] 09/06/2014, 7:55 PM

## 2014-09-06 NOTE — Evaluation (Signed)
Physical Therapy Assessment and Plan  Patient Details  Name: Kayla [name] MRN: [id number] Date of Birth: April 09, 1941  PT Diagnosis: Abnormal posture, Abnormality of gait, Hemiplegia non-dominant, Impaired cognition, Impaired sensation and Muscle weakness Rehab Potential: Good ELOS: 10-12 days   Today's Date: 09/06/2014 PT Individual Time: 6144-3154 PT Individual Time Calculation (min): 60 min    Problem List:  Patient Active Problem List   Diagnosis Date Noted  . Left homonymous hemianopsia 09/06/2014  . Occipital infarction 09/05/2014  . AKI (acute kidney injury)   . Confusion   . Altered mental status 08/30/2014  . Acute ischemic right MCA stroke 08/30/2014  . Acute right PCA stroke 08/30/2014  . Abnormal chest x-ray 08/22/2014  . Type 2 diabetes mellitus with diabetic neuropathy 01/04/2014  . HYPERCHOLESTEROLEMIA, MILD 05/22/2009  . PERS HX NONCOMPLIANCE W/MED TX PRS HAZARDS HLTH 07/11/2007  . Essential hypertension 05/04/2007  . Osteoarthritis 05/04/2007  . Fibromyalgia 05/04/2007    Past Medical History:  Past Medical History  Diagnosis Date  . Asthmatic bronchitis   . Hypertension   . Venous insufficiency   . Diabetes mellitus   . Hypercholesteremia   . Diverticulosis of colon   . Constipation   . Prolapse of vaginal vault after hysterectomy   . Proteinuria   . DJD (degenerative joint disease)   . Fibromyalgia   . Somatic dysfunction   . Anxiety   . Personal history of noncompliance with medical treatment, presenting hazards to health   . Sickle-cell trait   . Allergy     Shell fish  . GERD (gastroesophageal reflux disease)    Past Surgical History:  Past Surgical History  Procedure Laterality Date  . Abdominal hysterectomy    . Robotic assisted laparoscopic sacrocolpopexy  09/2009    Dr. Matilde Sprang  . Cataract extraction w/ intraocular lens  implant, bilateral  2012  . Tee without cardioversion N/A 09/02/2014    Procedure: TRANSESOPHAGEAL  ECHOCARDIOGRAM (TEE);  Surgeon: Lelon Perla, MD;  Location[facility] ENDOSCOPY;  Service: Cardiovascular;  Laterality: N/A;  . Loop recorder implant N/A 09/03/2014    Procedure: LOOP RECORDER IMPLANT;  Surgeon: Thompson Grayer, MD;  Location: [facility] CATH LAB;  Service: Cardiovascular;  Laterality: N/A;    Assessment & Plan Clinical Impression: [name] is a 74 y.o. right handed female with history of hypertension, diabetes mellitus and peripheral neuropathy, sickle cell trait and recent bronchitis. Independently with occasional cane prior to admission living with her husband. Admitted 08/30/2014 with altered mental status and left-sided weakness. MRI of the brain showed acute nonhemorrhagic infarct involving the right occipital lobe and posterior inferior right temporal lobe. MRA of the head showed high-grade tandem stenosis of the distal right M1 segment and associated proximal right M2 segment. Carotid Dopplers with no ICA stenosis. Patient did not receive TPA. TEE 09/02/2014 with normal LV function, negative saline cavitation study. Neurology consulted presently on aspirin for CVA prophylaxis as well as subcutaneous Lovenox for DVT prophylaxis. Underwent implantable loop recorder 09/03/2014 per Dr. Rayann Heman. Patient transferred to CIR on 09/05/2014 .   Patient currently requires mod with mobility secondary to muscle weakness, impaired timing and sequencing, unbalanced muscle activation and decreased coordination, decreased visual perceptual skills and field cut, decreased attention to left, decreased attention, decreased awareness, decreased problem solving, decreased safety awareness, decreased memory and delayed processing and decreased standing balance, decreased postural control, hemiplegia and decreased balance strategies.  Prior to hospitalization, patient was modified independent  with mobility and lived with Spouse in  a House home.  Home access is 3 (2 in front; 3 in back (main entrance) and pt able  tio reach both rails) .  Patient will benefit from skilled PT intervention to maximize safe functional mobility and minimize fall risk for planned discharge home with 24 hour supervision and intermittent assist.  Anticipate patient will benefit from follow up [facility] at discharge.  PT - End of Session Endurance Deficit: Yes Endurance Deficit Description: Gait distance limited by pt tendecy to impulsively sit down due to significant fatigue, PT Assessment Rehab Potential (ACUTE/IP ONLY): Good PT Patient demonstrates impairments in the following area(s): Balance;Endurance;Motor;Perception;Safety;Sensory;Other (comment) (Cognition) PT Transfers Functional Problem(s): Bed Mobility;Bed to Chair;Car;Furniture;Floor PT Locomotion Functional Problem(s): Ambulation;Wheelchair Mobility;Stairs PT Plan PT Intensity: Minimum of 1-2 x/day ,45 to 90 minutes PT Frequency: 5 out of 7 days PT Duration Estimated Length of Stay: 10-12 days PT Treatment/Interventions: Ambulation/gait training;Balance/vestibular training;Cognitive remediation/compensation;Functional mobility training;Disease management/prevention;Discharge planning;DME/adaptive equipment instruction;Patient/family education;Splinting/orthotics;Therapeutic Exercise;Visual/perceptual remediation/compensation;Therapeutic Activities;UE/LE Coordination activities;Neuromuscular re-education;Stair training;UE/LE Strength taining/ROM;Wheelchair propulsion/positioning PT Transfers Anticipated Outcome(s): Supervision PT Locomotion Anticipated Outcome(s): Ambulatory requiring supervision to min A PT Recommendation Recommendations for Other Services: Other (comment) (None at this time) Follow Up Recommendations: 24 hour supervision/assistance;Home health PT Patient destination: Home Equipment Recommended: To be determined Equipment Details: Pt owns personal standard cane and rolling walker.  Skilled Therapeutic Intervention PT evaluation performed. See below  for detailed findings. Treatment initiated. Session focused on gait training, stair negotiation, and pt/family education. See below for detailed description of assist/cueing required with bed mobility, w/c mobility, gait and stairs. Educated pt and hsuband on findings, goals, and plan of care. Oriented pt and family to rehab unit, fall precautions. Pt/family verbalized understanding of all education and were in full agreement with plan of care. Educated pt/family on visual field cut; encouraged family to sit on pt's L side and demonstrated cueing for visual scanning to L of midline; pt/family will likely require reinforcement. Departed with pt seated in recliner with bilat LE's elevated, family present, and all needs within reach.  PT Evaluation Precautions/Restrictions Precautions Precautions: Fall General   Vital Signs  Pain  Pt reports no pain during this evaluation/session. Home Living/Prior Functioning Home Living Available Help at Discharge: Family;Other (Comment) (dtr is very supportive) Type of Home: House Entrance Stairs-Number of Steps: 3 (2 in front; 3 in back (main entrance) and pt able tio reach both rails) Entrance Stairs-Rails: Right;Left;Can reach both Home Layout: Two level Additional Comments: pt enters home through back door, which has 3 steps with 2 rails. 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 Leisure: Hobbies-yes (Comment) (Likes to watch TV) Comments: Pt owns rolling walker and standard cane/ Vision/Perception  Vision -  Assessment Tracking/Visual Pursuits: Right eye does not track medially;Decreased smoothness of horizontal tracking;Requires cues, head turns, or add eye shifts to track Saccades: Additional eye shifts occurred during testing;Additional head turns occurred during testing  Cognition Overall Cognitive Status: Impaired/Different from baseline Arousal/Alertness: Awake/alert Orientation Level: Oriented X4 Attention: Alternating Alternating Attention: Impaired Alternating Attention Impairment: Verbal complex;Functional complex Memory: Impaired Memory Impairment: Decreased recall of new information;Decreased short term memory Decreased Short Term Memory: Verbal complex;Functional basic Awareness: Impaired Awareness Impairment: Emergent  impairment Problem Solving: Impaired Problem Solving Impairment: Functional basic;Verbal complex Behaviors: Impulsive Safety/Judgment: Impaired Comments: nurse tech reported that patient had tried to get up without assist prior to start of session  Sensation Sensation Light Touch: Impaired by gross assessment Stereognosis: Not tested Hot/Cold: Not tested Proprioception: Impaired by gross assessment Additional Comments: Pt reports dull sensation in LUE/LLE. Observation of gait suggestive of decreased LLE proprioception. Coordination Gross Motor Movements are Fluid and Coordinated: No Fine Motor Movements are Fluid and Coordinated: No Finger Nose Finger Test: mildly impaired Lt when compared to Rt Heel Shin Test: LLE limited by weakness Motor  Motor Motor: Hemiplegia;Abnormal postural alignment and control Motor - Skilled Clinical Observations: L hemiplegia; lateral trunk lean to R side with standing, gait  Mobility Bed Mobility Bed Mobility: Supine to Sit;Sit to Supine Supine to Sit: 5: Supervision;HOB flat Supine to Sit Details: Verbal cues for precautions/safety Supine to Sit Details (indicate cue type and reason): Cueing for attention to LLE. Sit to  Supine: 5: Supervision;HOB flat Sit to Supine - Details: Verbal cues for precautions/safety Transfers Transfers: Yes Sit to Stand: 4: Min assist Sit to Stand Details: Tactile cues for weight shifting;Verbal cues for precautions/safety Stand to Sit: 4: Min assist Stand to Sit Details (indicate cue type and reason): Verbal cues for precautions/safety;Tactile cues for weight shifting;Manual facilitation for placement Stand Pivot Transfers: 3: Mod assist Stand Pivot Transfer Details: Manual facilitation for placement;Tactile cues for weight shifting;Verbal cues for precautions/safety Locomotion  Ambulation Ambulation: Yes Ambulation/Gait Assistance: 3: Mod assist;4: Min assist Ambulation Distance (Feet): 75 Feet (x2 trials) Assistive device: 1 person hand held assist Ambulation/Gait Assistance Details: Tactile cues for weight shifting;Verbal cues for precautions/safety Gait Gait: Yes Gait Pattern: Impaired Gait Pattern: Step-through pattern;Narrow base of support;Trunk flexed;Poor foot clearance - left;Decreased dorsiflexion - left;Right flexed knee in stance;Trendelenburg;Decreased stance time - left;Decreased step length - right;Decreased weight shift to left;Lateral trunk lean to right;Decreased trunk rotation Gait velocity: Gait speed = 0.46 m/s Stairs / Additional Locomotion Stairs: Yes Stairs Assistance: 4: Min assist Stairs Assistance Details: Tactile cues for weight shifting;Manual facilitation for placement;Verbal cues for technique Stairs Assistance Details (indicate cue type and reason): Pt negotiated 3 stairs with bilat rails, forward-facing with step-to pattern, manual advancement of L hand on rail, frequent verbal/tactile cueing for sequencing. Stair Management Technique: Forwards;Two rails;Step to pattern Number of Stairs: 3 Height of Stairs: 4.5 Ramp: 4: Min assist;Other (comment) (bilat rails) Wheelchair Mobility Wheelchair Mobility: Yes Wheelchair Assistance: 3: Mod  assist Wheelchair Assistance Details: Tactile cues for weight beaing;Tactile cues for sequencing;Tactile cues for placement;Verbal cues for technique;Tactile cues for initiation Wheelchair Propulsion: Both lower extermities Wheelchair Parts Management: Needs assistance Distance: 12  Trunk/Postural Assessment  Cervical Assessment Cervical Assessment: Within Functional Limits Thoracic Assessment Thoracic Assessment: Within Functional Limits Lumbar Assessment Lumbar Assessment: Within Functional Limits Postural Control Postural Control: Deficits on evaluation Trunk Control: With dynamic standing, pt exhibits tendency toward LOB to R side. Righting Reactions: Ineffective hip strategy; delayed ankle strategy.  Balance Balance Balance Assessed: Yes Dynamic Standing Balance Dynamic Standing - Balance Support: No upper extremity supported;During functional activity Dynamic Standing - Level of Assistance: 3: Mod assist;4: Min assist Extremity Assessment  RUE Assessment RUE Assessment: Within Functional Limits LUE Assessment LUE Assessment: Exceptions to [facility] (strength grossly 2+ to 3- proximal to distal) RLE Assessment RLE Assessment: Exceptions to [facility] RLE Strength RLE Overall Strength: Deficits RLE Overall Strength Comments: Grossly 4-/5 to 4/5. LLE Assessment LLE Assessment: Exceptions to [facility] LLE Strength LLE Overall  Strength: Deficits Left Hip Flexion: 3-/5 Left Knee Flexion: 3+/5 Left Knee Extension: 3+/5 Left Ankle Dorsiflexion: 3+/5 Left Ankle Plantar Flexion: 3-/5  FIM:  FIM - Bed/Chair Transfer Bed/Chair Transfer: 5: Supine > Sit: Supervision (verbal cues/safety issues);5: Sit > Supine: Supervision (verbal cues/safety issues);3: Chair or W/C > Bed: Mod A (lift or lower assist);3: Bed > Chair or W/C: Mod A (lift or lower assist) FIM - Locomotion: Wheelchair Distance: 12 Locomotion: Wheelchair: 1: Travels less than 50 ft with moderate assistance (Pt: 50 - 74%) FIM -  Locomotion: Ambulation Locomotion: Ambulation Assistive Devices: Other (comment) (L HHA) Ambulation/Gait Assistance: 3: Mod assist;4: Min assist Locomotion: Ambulation: 2: Travels 50 - 149 ft with moderate assistance (Pt: 50 - 74%) FIM - Locomotion: Stairs Locomotion: Scientist, physiological: Hand rail - 2 Locomotion: Stairs: 1: Up and Down < 4 stairs with minimal assistance (Pt.>75%)   Refer to Care Plan for Long Term Goals  Recommendations for other services: None  Discharge Criteria: Patient will be discharged from PT if patient refuses treatment 3 consecutive times without medical reason, if treatment goals not met, if there is a change in medical status, if patient makes no progress towards goals or if patient is discharged from hospital.  The above assessment, treatment plan, treatment alternatives and goals were discussed and mutually agreed upon: by patient and by family  Stefano Gaul 09/06/2014, 10:56 PM

## 2014-09-07 ENCOUNTER — Inpatient Hospital Stay (HOSPITAL_COMMUNITY): Payer: Medicare HMO | Admitting: Occupational Therapy

## 2014-09-07 ENCOUNTER — Inpatient Hospital Stay (HOSPITAL_COMMUNITY): Payer: Medicare HMO | Admitting: Speech Pathology

## 2014-09-07 ENCOUNTER — Inpatient Hospital Stay (HOSPITAL_COMMUNITY): Payer: Medicare HMO | Admitting: *Deleted

## 2014-09-07 LAB — GLUCOSE, CAPILLARY
GLUCOSE-CAPILLARY: 130 mg/dL — AB (ref 70–99)
GLUCOSE-CAPILLARY: 182 mg/dL — AB (ref 70–99)
Glucose-Capillary: 152 mg/dL — ABNORMAL HIGH (ref 70–99)
Glucose-Capillary: 195 mg/dL — ABNORMAL HIGH (ref 70–99)

## 2014-09-07 NOTE — Progress Notes (Signed)
Physical Therapy Session Note  Patient Details  Name: Kayla [name] MRN: [id number] Date of Birth: 01/10/1941  Today's Date: 09/07/2014 PT Individual Time: 1315-1415 PT Individual Time Calculation (min): 60 min   Short Term Goals: Week 1:  PT Short Term Goal 1 (Week 1): Pt will transfer from bed <> w/c with supervision and 25% cueing for attention to L visual field/L side of body. PT Short Term Goal 2 (Week 1): Pt will ambulate x150' in controlled environment using LRAD with supervision and 25% cueing for visual scanning to L of midline. PT Short Term Goal 3 (Week 1): Pt will negotiate 3 stairs with 2 rails requiring supervision and 25% cueing for sequencing, attention to [place]. PT Short Term Goal 4 (Week 1): Pt will ambulate x50' in home environment with LRAD and 25% cueing for visual scanning and obstacle negotiation.  Skilled Therapeutic Interventions/Progress Updates:    Pt with L foot drag during gait that improves with cueing for LE clearance. Pt with difficulty carrying over due to attention and L sided pelvic drop. Pt consistently fatigues as session progresses. Pt would continue to benefit from skilled PT services to increase functional mobility.  Therapy Documentation Precautions:  Precautions Precautions: Fall Precaution Comments: L hemi Restrictions Weight Bearing Restrictions: No Pain: Pain Assessment Pain Assessment: No/denies pain Mobility:  Pt performs transfers Min A with cues for weight shift, hand placement, attention, and safety Locomotion : Ambulation Ambulation/Gait Assistance: 3: Mod assist  Other Treatments:  Pt performs marching, ankle pumps, anterior weight shifts 2x10. Pt educated on rehab plan, progressing OOB, and safety in mobility. Gait with LUE forced use on tray table 25'x2. T/S and pec soft tissue stretch 2x10. Stimulation during rest breaks on pt's L side. LAQs, chops in sitting, Hip ER AROM 2x10. Pt performs dynamic sitting and standing balance  activities with dual motor tasks including weight shifting, reaching, grasping within functional task.Tap-ups 2x10 with LLE and no UE support.   See FIM for current functional status  Therapy/Group: Individual Therapy  Monia Pouch 09/07/2014, 1:24 PM

## 2014-09-07 NOTE — Progress Notes (Signed)
Occupational Therapy Session Note  Patient Details  Name: Kayla [name] MRN: [id number] Date of Birth: [birth year]  Today's Date: 09/07/2014 OT Individual Time: 1749-4496 and 1415-1450 OT Individual Time Calculation (min): 60 min and 35 min   Short Term Goals: Week 1:  OT Short Term Goal 1 (Week 1): Pt will scan to Lt to obtain items to complete self-care tasks with verbal cues <10% of time. OT Short Term Goal 2 (Week 1): Pt will complete bathing with supervision at sit > stand level OT Short Term Goal 3 (Week 1): Pt will complete UB dressing with min assist OT Short Term Goal 4 (Week 1): Pt will complete LB dressing with min assist OT Short Term Goal 5 (Week 1): Pt will complete toilet transfer with supervision with LRAD  Skilled Therapeutic Interventions/Progress Updates:    1) Engaged in ADL retraining with focus on functional mobility, transfers, and self-care tasks of bathing and dressing while focusing on compensatory strategies to compensate for Lt visual field cut.  Ambulated with hand held assist into room shower with min assist.  Bathing completed at sit > stand level with min/steady assist when standing to wash buttocks.  Mod verbal cues to scan to Lt to obtain items in shower.  Educated on safe sit <> stand technique as pt tending to "plop" down when sitting.  Educated on hemi-dressing technique to increase independence with LB dressing.  9 hole peg test completed with 51 seconds on Rt and 3:14 on Lt - noted difficulty picking up and manipulating pegs, occasional overshooting, and difficulty with locating items in Lt visual field.  Educated pt and daughter on findings with 9 hole peg test and carryover to functional tasks as well.  2) Engaged in therapeutic activity with focus on sit <> stand, standing balance, Lt attention, and functional use of LUE.  Completed card activity in sitting progressing to standing in therapy gym with pt able to complete sit <> stand with close supervision  and cues for control with stand > sit.  Therapist positioned self and activity on Lt of pt to promote pt scanning to Lt to engage in activity.  Pt with tendency to drop cards on Lt due to decreased grasp as well as sustained attention to LUE.  Required min cues for sequencing of card activity, but pt able to compensate with head turn to locate and obtain items on Lt without additional cues.  Therapy Documentation Precautions:  Precautions Precautions: Fall Restrictions Weight Bearing Restrictions: No Pain:  pt with no c/o pain  See FIM for current functional status  Therapy/Group: Individual Therapy  Simonne Come 09/07/2014, 10:44 AM

## 2014-09-07 NOTE — Progress Notes (Signed)
74 y.o. right handed female with history of hypertension, diabetes mellitus and peripheral neuropathy, sickle cell trait and recent bronchitis. Independently with occasional cane prior to admission living with her husband. Admitted 08/30/2014 with altered mental status and left-sided weakness. MRI of the brain showed acute nonhemorrhagic infarct involving the right occipital lobe and posterior inferior right temporal lobe. MRA of the head showed high-grade tandem stenosis of the distal right M1 segment and associated proximal right M2 segment. Carotid Dopplers with no ICA stenosis. Patient did not receive TPA. TEE 09/02/2014 with normal LV function, negative saline cavitation study. Subjective/Complaints: No complaints. Slept well. No pain, sob, cough, n/v/d  Objective: Vital Signs: Blood pressure 138/81, pulse 87, temperature 97.7 F (36.5 C), temperature source Oral, resp. rate 18, height $RemoveBe'5\' 6"'XOmDptzbt$  (1.676 m), weight 89 kg (196 lb 3.4 oz), SpO2 100 %. No results found. Results for orders placed or performed during the hospital encounter of 09/05/14 (from the past 72 hour(s))  Glucose, capillary     Status: Abnormal   Collection Time: 09/05/14  5:15 PM  Result Value Ref Range   Glucose-Capillary 148 (H) 70 - 99 mg/dL  CBC     Status: Abnormal   Collection Time: 09/05/14  7:44 PM  Result Value Ref Range   WBC 4.8 4.0 - 10.5 K/uL   RBC 5.08 3.87 - 5.11 MIL/uL   Hemoglobin 14.8 12.0 - 15.0 g/dL   HCT 45.1 36.0 - 46.0 %   MCV 88.8 78.0 - 100.0 fL   MCH 29.1 26.0 - 34.0 pg   MCHC 32.8 30.0 - 36.0 g/dL   RDW 15.8 (H) 11.5 - 15.5 %   Platelets 223 150 - 400 K/uL  Creatinine, serum     Status: Abnormal   Collection Time: 09/05/14  7:44 PM  Result Value Ref Range   Creatinine, Ser 1.35 (H) 0.50 - 1.10 mg/dL   GFR calc non Af Amer 38 (L) >90 mL/min   GFR calc Af Amer 44 (L) >90 mL/min    Comment: (NOTE) The eGFR has been calculated using the CKD EPI equation. This calculation has not been  validated in all clinical situations. eGFR's persistently <90 mL/min signify possible Chronic Kidney Disease.   Glucose, capillary     Status: Abnormal   Collection Time: 09/05/14  9:11 PM  Result Value Ref Range   Glucose-Capillary 175 (H) 70 - 99 mg/dL  CBC WITH DIFFERENTIAL     Status: Abnormal   Collection Time: 09/06/14  6:40 AM  Result Value Ref Range   WBC 5.4 4.0 - 10.5 K/uL   RBC 5.13 (H) 3.87 - 5.11 MIL/uL   Hemoglobin 14.9 12.0 - 15.0 g/dL   HCT 45.3 36.0 - 46.0 %   MCV 88.3 78.0 - 100.0 fL   MCH 29.0 26.0 - 34.0 pg   MCHC 32.9 30.0 - 36.0 g/dL   RDW 15.8 (H) 11.5 - 15.5 %   Platelets 228 150 - 400 K/uL   Neutrophils Relative % 43 43 - 77 %   Neutro Abs 2.3 1.7 - 7.7 K/uL   Lymphocytes Relative 39 12 - 46 %   Lymphs Abs 2.1 0.7 - 4.0 K/uL   Monocytes Relative 12 3 - 12 %   Monocytes Absolute 0.7 0.1 - 1.0 K/uL   Eosinophils Relative 6 (H) 0 - 5 %   Eosinophils Absolute 0.3 0.0 - 0.7 K/uL   Basophils Relative 0 0 - 1 %   Basophils Absolute 0.0 0.0 - 0.1 K/uL  Comprehensive metabolic panel     Status: Abnormal   Collection Time: 09/06/14  6:40 AM  Result Value Ref Range   Sodium 139 135 - 145 mmol/L   Potassium 3.8 3.5 - 5.1 mmol/L   Chloride 103 96 - 112 mmol/L   CO2 29 19 - 32 mmol/L   Glucose, Bld 137 (H) 70 - 99 mg/dL   BUN 21 6 - 23 mg/dL   Creatinine, Ser 1.30 (H) 0.50 - 1.10 mg/dL   Calcium 9.2 8.4 - 10.5 mg/dL   Total Protein 7.1 6.0 - 8.3 g/dL   Albumin 3.4 (L) 3.5 - 5.2 g/dL   AST 21 0 - 37 U/L   ALT 20 0 - 35 U/L   Alkaline Phosphatase 67 39 - 117 U/L   Total Bilirubin 0.6 0.3 - 1.2 mg/dL   GFR calc non Af Amer 39 (L) >90 mL/min   GFR calc Af Amer 46 (L) >90 mL/min    Comment: (NOTE) The eGFR has been calculated using the CKD EPI equation. This calculation has not been validated in all clinical situations. eGFR's persistently <90 mL/min signify possible Chronic Kidney Disease.    Anion gap 7 5 - 15  Glucose, capillary     Status: Abnormal    Collection Time: 09/06/14  6:54 AM  Result Value Ref Range   Glucose-Capillary 147 (H) 70 - 99 mg/dL  Glucose, capillary     Status: Abnormal   Collection Time: 09/06/14 11:51 AM  Result Value Ref Range   Glucose-Capillary 155 (H) 70 - 99 mg/dL   Comment 1 Notify RN   Glucose, capillary     Status: Abnormal   Collection Time: 09/06/14  4:54 PM  Result Value Ref Range   Glucose-Capillary 157 (H) 70 - 99 mg/dL   Comment 1 Notify RN   Glucose, capillary     Status: Abnormal   Collection Time: 09/06/14  9:14 PM  Result Value Ref Range   Glucose-Capillary 217 (H) 70 - 99 mg/dL   Comment 1 Notify RN   Glucose, capillary     Status: Abnormal   Collection Time: 09/07/14  6:58 AM  Result Value Ref Range   Glucose-Capillary 152 (H) 70 - 99 mg/dL   Comment 1 Notify RN      HEENT: normal Cardio: RRR and no murmur Resp: CTA B/L and unlabored GI: BS positive and NT, ND Extremity:  Pulses positive and No Edema Skin:   Intact Neuro: Alert/Oriented, Normal Sensory, Abnormal Motor 3-/5 L Bi, Tri,Grip, HF, 4/5 KE,  5/5 on right and Other Left Homonymous hemianopsia is persistent Musc/Skel:  Normal and Other no pain with AROM BUE and BLE Gen NAD Psych: mood pleasant, cooperative   Assessment/Plan: 1. Functional deficits secondary to Right temporal occipital lobe infarct, posterior cerebral artery distribution which require 3+ hours per day of interdisciplinary therapy in a comprehensive inpatient rehab setting. Physiatrist is providing close team supervision and 24 hour management of active medical problems listed below. Physiatrist and rehab team continue to assess barriers to discharge/monitor patient progress toward functional and medical goals. FIM: FIM - Bathing Bathing Steps Patient Completed: Chest, Right Arm, Left Arm, Abdomen, Front perineal area, Buttocks, Right upper leg, Left upper leg, Right lower leg (including foot), Left lower leg (including foot) Bathing: 4: Steadying  assist  FIM - Upper Body Dressing/Undressing Upper body dressing/undressing steps patient completed: Thread/unthread right bra strap, Thread/unthread right sleeve of pullover shirt/dresss, Thread/unthread left sleeve of pullover shirt/dress, Put head through opening  of pull over shirt/dress, Pull shirt over trunk Upper body dressing/undressing: 3: Mod-Patient completed 50-74% of tasks FIM - Lower Body Dressing/Undressing Lower body dressing/undressing steps patient completed: Thread/unthread right underwear leg, Thread/unthread left underwear leg, Pull underwear up/down, Thread/unthread right pants leg, Thread/unthread left pants leg, Pull pants up/down Lower body dressing/undressing: 3: Mod-Patient completed 50-74% of tasks  FIM - Toileting Toileting steps completed by patient: Adjust clothing prior to toileting, Performs perineal hygiene, Adjust clothing after toileting Toileting Assistive Devices: Grab bar or rail for support Toileting: 6: Assistive device: No helper  FIM - Radio producer Devices: Insurance account manager Transfers: 4-To toilet/BSC: Min A (steadying Pt. > 75%), 4-From toilet/BSC: Min A (steadying Pt. > 75%)  FIM - Bed/Chair Transfer Bed/Chair Transfer: 5: Supine > Sit: Supervision (verbal cues/safety issues), 5: Sit > Supine: Supervision (verbal cues/safety issues), 3: Chair or W/C > Bed: Mod A (lift or lower assist), 3: Bed > Chair or W/C: Mod A (lift or lower assist)  FIM - Locomotion: Wheelchair Distance: 12 Locomotion: Wheelchair: 1: Travels less than 50 ft with moderate assistance (Pt: 50 - 74%) FIM - Locomotion: Ambulation Locomotion: Ambulation Assistive Devices: Other (comment) (L HHA) Ambulation/Gait Assistance: 3: Mod assist, 4: Min assist Locomotion: Ambulation: 2: Travels 50 - 149 ft with moderate assistance (Pt: 50 - 74%)  Comprehension Comprehension Mode: Auditory Comprehension: 5-Follows basic conversation/direction: With extra  time/assistive device  Expression Expression Mode: Verbal Expression: 5-Expresses basic needs/ideas: With extra time/assistive device  Social Interaction Social Interaction: 5-Interacts appropriately 90% of the time - Needs monitoring or encouragement for participation or interaction.  Problem Solving Problem Solving Mode: Not assessed Problem Solving: 3-Solves basic 50 - 74% of the time/requires cueing 25 - 49% of the time  Memory Memory Mode: Not assessed Memory: 3-Recognizes or recalls 50 - 74% of the time/requires cueing 25 - 49% of the time  Medical Problem List and Plan: 1. Functional deficits secondary to right brain infarct with left hemiparesis. Status post loop recorder 09/03/2014 2. DVT Prophylaxis/Anticoagulation: Subcutaneous Lovenox. Monitor platelet counts and any signs of bleeding. 3. Pain Management: Tylenol as needed 4. Diabetes mellitus with peripheral neuropathy. Hemoglobin A1c 7.2. Check blood sugars before meals and at bedtime. Presently on sliding scale insulin. Patient on Glucophage 500 mg daily prior to admission. Resume as tolerated, elevated creat will hold off for now, consider amaryl 5. Neuropsych: This patient is capable of making decisions on her own behalf. 6. Skin/Wound Care: Routine skin checks 7. Fluids/Electrolytes/Nutrition: Strict I and O's with follow-up chemistries 8. Hypertension. Presently on Tenormin 50 mg daily, hydrochlorothiazide 25 mg daily, Avapro 300 mg daily. Monitor with increased mobility. 9. Hyperlipidemia. Lipitor  LOS (Days) 2 A FACE TO FACE EVALUATION WAS PERFORMED  SWARTZ,ZACHARY T 09/07/2014, 9:24 AM

## 2014-09-07 NOTE — Progress Notes (Signed)
Speech Language Pathology Daily Session Note  Patient Details  Name: Kayla [name] MRN: [id number] Date of Birth: 20-Mar-1941  Today's Date: 09/07/2014 SLP Individual Time: 0131-4388 SLP Individual Time Calculation (min): 45 min  Short Term Goals: Week 1: SLP Short Term Goal 1 (Week 1): Patient will demonstrate basic problem-solving in 75% of observable opportunities given Mod A verbal, visual, and tactile cues. SLP Short Term Goal 2 (Week 1): Patient will utilize external aids to assist in recall of new, daily information over 3 consecutive sessions given Min A verbal and visual cues. SLP Short Term Goal 3 (Week 1): Patient will demonstrate alternating attention between functional tasks in a quiet environment for 5 minutes given Min A verbal and visual cues. SLP Short Term Goal 4 (Week 1): Patient will self-monitor and correct errors during self-care and home management tasks given Min A question cues. SLP Short Term Goal 5 (Week 1): Patient will attend to left of environment in 75% of observable opportunities given Min A verbal, visual, and tactile cues.  Skilled Therapeutic Interventions:  Pt was seen for skilled ST targeting cognitive goals.  Upon arrival, pt was reclined in bed, awake, alert, and agreeable to participate in [place].  Pt transferred to wheelchair to maximize attention and alertness during structured therapeutic tasks.  SLP facilitated the session with a semi-complex home management task targeting functional problem solving and left attention.  SLP provided skilled education prior to task initiation regarding organization/ memory compensatory strategies such as making lists and using calendars.  Pt made a grocery list of 14 targeted items with min assist demonstration cues to facilitate improved delayed recall when looking for said items in a grocery store flyer.  Pt required max verbal and visual cues with the use of a left marginal anchor when referring back to her list as well as  when visually scanning the abovementioned flyer due to decreased visual scanning to the left of midline and overall disorganized scanning patterns.  Pt benefited from simplification of visual stimuli to focus her visual attention.  Continue per current plan of care.    FIM:  Comprehension Comprehension Mode: Auditory Comprehension: 5-Follows basic conversation/direction: With extra time/assistive device Expression Expression Mode: Verbal Expression: 5-Expresses basic needs/ideas: With extra time/assistive device Social Interaction Social Interaction: 5-Interacts appropriately 90% of the time - Needs monitoring or encouragement for participation or interaction. Problem Solving Problem Solving Mode: Not assessed Problem Solving: 3-Solves basic 50 - 74% of the time/requires cueing 25 - 49% of the time Memory Memory: 3-Recognizes or recalls 50 - 74% of the time/requires cueing 25 - 49% of the time  Pain Pain Assessment Pain Assessment: No/denies pain  Therapy/Group: Individual Therapy  Amen Dargis, Selinda Orion 09/07/2014, 3:09 PM

## 2014-09-07 NOTE — IPOC Note (Signed)
Overall Plan of Care [facility]) Patient Details Name: Kayla [name] MRN: [id number] DOB: 06-09-1941  Admitting Diagnosis: CVA  Hospital Problems: Active Problems:   Acute right PCA stroke   Occipital infarction   Left homonymous hemianopsia     Functional Problem List: Nursing Endurance, Medication Management, Pain, Safety, Skin Integrity  PT Balance, Endurance, Motor, Perception, Safety, Sensory, Other (comment) (Cognition)  OT Balance, Cognition, Motor, Safety, Vision  SLP Cognition  TR         Basic ADL's: OT Grooming, Bathing, Dressing, Toileting     Advanced  ADL's: OT Simple Meal Preparation     Transfers: PT Bed Mobility, Bed to Chair, Car, [employer], Futures trader, Metallurgist: PT Ambulation, Emergency planning/management officer, Stairs     Additional Impairments: OT Fuctional Use of Upper Extremity  SLP Social Cognition   Problem Solving, Memory, Attention, Awareness  TR      Anticipated Outcomes Item Anticipated Outcome  Self Feeding Mod I  Swallowing      Basic self-care  supervision  Toileting  supervision   Bathroom Transfers supervision  Bowel/Bladder  Patient is continent of bowel and bladder and will remain so.  Transfers  Supervision  Locomotion  Ambulatory requiring supervision to min A  Communication     Cognition  Supervision with basic   Pain  Paia will be less than or equal to 4 oon a scale of 0-10  Safety/Judgment  Patient will be free from falls/injury and display sound judgement regarding safety issues   Therapy Plan: PT Intensity: Minimum of 1-2 x/day ,45 to 90 minutes PT Frequency: 5 out of 7 days PT Duration Estimated Length of Stay: 10-12 days OT Intensity: Minimum of 1-2 x/day, 45 to 90 minutes OT Frequency: 5 out of 7 days OT Duration/Estimated Length of Stay: 10-14 days SLP Intensity: Minumum of 1-2 x/day, 30 to 90 minutes SLP Frequency: 3 to 5 out of 7 days SLP Duration/Estimated Length of Stay: 10-14 days         Team Interventions: Nursing Interventions Patient/Family Education, Disease Management/Prevention, Pain Management, Medication Management, Discharge Planning  PT interventions Ambulation/gait training, Balance/vestibular training, Cognitive remediation/compensation, Functional mobility training, Disease management/prevention, Discharge planning, DME/adaptive equipment instruction, Patient/family education, Splinting/orthotics, Therapeutic Exercise, Visual/perceptual remediation/compensation, Therapeutic Activities, UE/LE Coordination activities, Neuromuscular re-education, Stair training, UE/LE Strength taining/ROM, Wheelchair propulsion/positioning  OT Interventions Balance/vestibular training, Cognitive remediation/compensation, Discharge planning, DME/adaptive equipment instruction, Disease mangement/prevention, Functional mobility training, Neuromuscular re-education, Pain management, Patient/family education, Psychosocial support, Self Care/advanced ADL retraining, Therapeutic Activities, Therapeutic Exercise, UE/LE Strength taining/ROM, UE/LE Coordination activities, Visual/perceptual remediation/compensation  SLP Interventions Cueing hierarchy, Cognitive remediation/compensation, Environmental controls, Functional tasks, Therapeutic Activities, Internal/external aids, Patient/family education  TR Interventions    SW/CM Interventions Discharge Planning, Psychosocial Support, Patient/Family Education    Team Discharge Planning: Destination: PT-Home ,OT- Home , SLP-Home Projected Follow-up: PT-24 hour supervision/assistance, Home health PT, OT-  Outpatient OT, SLP-Outpatient SLP, 24 hour supervision/assistance, Home Health SLP Projected Equipment Needs: PT-To be determined, OT- Tub/shower bench, SLP-None recommended by SLP Equipment Details: PT-Pt owns personal standard cane and rolling walker., OT-  Patient/family involved in discharge planning: PT- Patient, Family member/caregiver,   OT-Patient, Family member/caregiver, SLP-Patient, Family member/caregiver  MD ELOS: 10-14 days Medical Rehab Prognosis:  Excellent Assessment: The patient has been admitted for CIR therapies with the diagnosis of CVA. The team will be addressing functional mobility, strength, stamina, balance, safety, adaptive techniques and equipment, self-care, bowel and bladder mgt, patient and caregiver education, NMR, cognitive  perceptual rx, visual perceptual rx, ego support, community reintegration. Goals have been set at supervision for self-care/ADl's, supervision for mobility and cognition. Pt has a supportive family.    Meredith Staggers, MD, FAAPMR      See Team Conference Notes for weekly updates to the plan of care

## 2014-09-08 ENCOUNTER — Inpatient Hospital Stay (HOSPITAL_COMMUNITY): Payer: Medicare HMO | Admitting: Physical Therapy

## 2014-09-08 ENCOUNTER — Inpatient Hospital Stay (HOSPITAL_COMMUNITY): Payer: Medicare HMO | Admitting: *Deleted

## 2014-09-08 DIAGNOSIS — E1165 Type 2 diabetes mellitus with hyperglycemia: Secondary | ICD-10-CM

## 2014-09-08 LAB — GLUCOSE, CAPILLARY
GLUCOSE-CAPILLARY: 134 mg/dL — AB (ref 70–99)
GLUCOSE-CAPILLARY: 282 mg/dL — AB (ref 70–99)
GLUCOSE-CAPILLARY: 176 mg/dL — AB (ref 70–99)
Glucose-Capillary: 139 mg/dL — ABNORMAL HIGH (ref 70–99)

## 2014-09-08 MED ORDER — GLIMEPIRIDE 1 MG PO TABS
1.0000 mg | ORAL_TABLET | Freq: Every day | ORAL | Status: DC
  Administered 2014-09-08 – 2014-09-17 (×10): 1 mg via ORAL
  Filled 2014-09-08 (×12): qty 1

## 2014-09-08 NOTE — Progress Notes (Signed)
Physical Therapy Session Note  Patient Details  Name: Kayla [name] MRN: [id number] Date of Birth: 1941/03/24  Today's Date: 09/08/2014 PT Individual Time: 0900-1000 PT Individual Time Calculation (min): 60 min   Short Term Goals: Week 1:  PT Short Term Goal 1 (Week 1): Pt will transfer from bed <> w/c with supervision and 25% cueing for attention to L visual field/L side of body. PT Short Term Goal 2 (Week 1): Pt will ambulate x150' in controlled environment using LRAD with supervision and 25% cueing for visual scanning to L of midline. PT Short Term Goal 3 (Week 1): Pt will negotiate 3 stairs with 2 rails requiring supervision and 25% cueing for sequencing, attention to [place]. PT Short Term Goal 4 (Week 1): Pt will ambulate x50' in home environment with LRAD and 25% cueing for visual scanning and obstacle negotiation.  Skilled Therapeutic Interventions/Progress Updates:   Patient received semi reclined in bed. Session focused on NMR, dynamic standing balance, and gait training. Patient transferred supine > sit with HOB flat and use of rail and performed stand pivot transfer bed > wheelchair with close supervision and verbal cues for technique. Patient propelled wheelchair using BLE with intermittent use of BUE x 160 ft for NMR with supervision and increased time, max verbal/visual cues to attend to left by turning head to avoid obstacles. Patient demonstrates increased fall risk as noted by score of 40/56 on Berg Balance Scale (<36= high risk for falls, close to 100%; 37-45 significant >80%; 46-51 moderate >50%; 52-55 lower >25%). Patient educated on falls risk, verbalized understanding. Gait training using RW 2 x 150 ft with min guard > close supervision, verbal cues for decreased pace, keeping RW close to body, and turning head to left for obstacle negotiation. Patient performed car transfer to sedan height using RW with supervision and verbal cues for technique/safety with RW. Patient  negotiated up/down 8 stairs using 2 rails with supervision, verbal cues for advancing LUE along rail and ensuring foot completely on step before stepping. Patient transferred sit <> stand multiple times with and without RW with mod cues for safe hand placement and supervision. Gait training back to room using RW x 160 ft with close supervision, max verbal cues for straight path, L heel strike to facilitate increased L foot clearance, and head turns to left for navigation and obstacle negotiation. Patient left sitting in recliner with family present, call bell within reach.   Therapy Documentation Precautions:  Precautions Precautions: Fall Precaution Comments: L hemi Restrictions Weight Bearing Restrictions: No Pain: Pain Assessment Pain Assessment: No/denies pain Locomotion : Ambulation Ambulation/Gait Assistance: 4: Min guard;5: Supervision Wheelchair Mobility Distance: 160  Balance: Balance Balance Assessed: Yes Standardized Balance Assessment Standardized Balance Assessment: Berg Balance Test Berg Balance Test Sit to Stand: Able to stand without using hands and stabilize independently Standing Unsupported: Able to stand safely 2 minutes Sitting with Back Unsupported but Feet Supported on Floor or Stool: Able to sit safely and securely 2 minutes Stand to Sit: Sits safely with minimal use of hands Transfers: Able to transfer with verbal cueing and /or supervision Standing Unsupported with Eyes Closed: Able to stand 10 seconds with supervision Standing Ubsupported with Feet Together: Able to place feet together independently and stand for 1 minute with supervision From Standing, Reach Forward with Outstretched Arm: Reaches forward but needs supervision From Standing Position, Pick up Object from Floor: Able to pick up shoe, needs supervision From Standing Position, Turn to Look Behind Over each Shoulder: Turn sideways  only but maintains balance Turn 360 Degrees: Able to turn 360  degrees safely but slowly Standing Unsupported, Alternately Place Feet on Step/Stool: Able to stand independently and complete 8 steps >20 seconds Standing Unsupported, One Foot in Front: Able to plae foot ahead of the other independently and hold 30 seconds Standing on One Leg: Able to lift leg independently and hold equal to or more than 3 seconds Total Score: 40/56  See FIM for current functional status  Therapy/Group: Individual Therapy  Laretta Alstrom 09/08/2014, 10:01 AM

## 2014-09-08 NOTE — Progress Notes (Signed)
74 y.o. right handed female with history of hypertension, diabetes mellitus and peripheral neuropathy, sickle cell trait and recent bronchitis. Independently with occasional cane prior to admission living with her husband. Admitted 08/30/2014 with altered mental status and left-sided weakness. MRI of the brain showed acute nonhemorrhagic infarct involving the right occipital lobe and posterior inferior right temporal lobe. MRA of the head showed high-grade tandem stenosis of the distal right M1 segment and associated proximal right M2 segment. Carotid Dopplers with no ICA stenosis. Patient did not receive TPA. TEE 09/02/2014 with normal LV function, negative saline cavitation study. Subjective/Complaints: No issues. Slept well. Denies pain.   Objective: Vital Signs: Blood pressure 145/84, pulse 71, temperature 98.3 F (36.8 C), temperature source Oral, resp. rate 18, height _0  (1.676 m), weight 89 kg (196 lb 3.4 oz), SpO2 97 %. No results found. Results for orders placed or performed during the hospital encounter of 09/05/14 (from the past 72 hour(s))  Glucose, capillary     Status: Abnormal   Collection Time: 09/05/14  5:15 PM  Result Value Ref Range   Glucose-Capillary 148 (H) 70 - 99 mg/dL  CBC     Status: Abnormal   Collection Time: 09/05/14  7:44 PM  Result Value Ref Range   WBC 4.8 4.0 - 10.5 K/uL   RBC 5.08 3.87 - 5.11 MIL/uL   Hemoglobin 14.8 12.0 - 15.0 g/dL   HCT 45.1 36.0 - 46.0 %   MCV 88.8 78.0 - 100.0 fL   MCH 29.1 26.0 - 34.0 pg   MCHC 32.8 30.0 - 36.0 g/dL   RDW 15.8 (H) 11.5 - 15.5 %   Platelets 223 150 - 400 K/uL  Creatinine, serum     Status: Abnormal   Collection Time: 09/05/14  7:44 PM  Result Value Ref Range   Creatinine, Ser 1.35 (H) 0.50 - 1.10 mg/dL   GFR calc non Af Amer 38 (L) >90 mL/min   GFR calc Af Amer 44 (L) >90 mL/min    Comment: (NOTE) The eGFR has been calculated using the CKD EPI equation. This calculation has not been validated in all  clinical situations. eGFR's persistently <90 mL/min signify possible Chronic Kidney Disease.   Glucose, capillary     Status: Abnormal   Collection Time: 09/05/14  9:11 PM  Result Value Ref Range   Glucose-Capillary 175 (H) 70 - 99 mg/dL  CBC WITH DIFFERENTIAL     Status: Abnormal   Collection Time: 09/06/14  6:40 AM  Result Value Ref Range   WBC 5.4 4.0 - 10.5 K/uL   RBC 5.13 (H) 3.87 - 5.11 MIL/uL   Hemoglobin 14.9 12.0 - 15.0 g/dL   HCT 45.3 36.0 - 46.0 %   MCV 88.3 78.0 - 100.0 fL   MCH 29.0 26.0 - 34.0 pg   MCHC 32.9 30.0 - 36.0 g/dL   RDW 15.8 (H) 11.5 - 15.5 %   Platelets 228 150 - 400 K/uL   Neutrophils Relative % 43 43 - 77 %   Neutro Abs 2.3 1.7 - 7.7 K/uL   Lymphocytes Relative 39 12 - 46 %   Lymphs Abs 2.1 0.7 - 4.0 K/uL   Monocytes Relative 12 3 - 12 %   Monocytes Absolute 0.7 0.1 - 1.0 K/uL   Eosinophils Relative 6 (H) 0 - 5 %   Eosinophils Absolute 0.3 0.0 - 0.7 K/uL   Basophils Relative 0 0 - 1 %   Basophils Absolute 0.0 0.0 - 0.1 K/uL  Comprehensive metabolic  panel     Status: Abnormal   Collection Time: 09/06/14  6:40 AM  Result Value Ref Range   Sodium 139 135 - 145 mmol/L   Potassium 3.8 3.5 - 5.1 mmol/L   Chloride 103 96 - 112 mmol/L   CO2 29 19 - 32 mmol/L   Glucose, Bld 137 (H) 70 - 99 mg/dL   BUN 21 6 - 23 mg/dL   Creatinine, Ser 1.30 (H) 0.50 - 1.10 mg/dL   Calcium 9.2 8.4 - 10.5 mg/dL   Total Protein 7.1 6.0 - 8.3 g/dL   Albumin 3.4 (L) 3.5 - 5.2 g/dL   AST 21 0 - 37 U/L   ALT 20 0 - 35 U/L   Alkaline Phosphatase 67 39 - 117 U/L   Total Bilirubin 0.6 0.3 - 1.2 mg/dL   GFR calc non Af Amer 39 (L) >90 mL/min   GFR calc Af Amer 46 (L) >90 mL/min    Comment: (NOTE) The eGFR has been calculated using the CKD EPI equation. This calculation has not been validated in all clinical situations. eGFR's persistently <90 mL/min signify possible Chronic Kidney Disease.    Anion gap 7 5 - 15  Glucose, capillary     Status: Abnormal   Collection Time:  09/06/14  6:54 AM  Result Value Ref Range   Glucose-Capillary 147 (H) 70 - 99 mg/dL  Glucose, capillary     Status: Abnormal   Collection Time: 09/06/14 11:51 AM  Result Value Ref Range   Glucose-Capillary 155 (H) 70 - 99 mg/dL   Comment 1 Notify RN   Glucose, capillary     Status: Abnormal   Collection Time: 09/06/14  4:54 PM  Result Value Ref Range   Glucose-Capillary 157 (H) 70 - 99 mg/dL   Comment 1 Notify RN   Glucose, capillary     Status: Abnormal   Collection Time: 09/06/14  9:14 PM  Result Value Ref Range   Glucose-Capillary 217 (H) 70 - 99 mg/dL   Comment 1 Notify RN   Glucose, capillary     Status: Abnormal   Collection Time: 09/07/14  6:58 AM  Result Value Ref Range   Glucose-Capillary 152 (H) 70 - 99 mg/dL   Comment 1 Notify RN   Glucose, capillary     Status: Abnormal   Collection Time: 09/07/14 11:37 AM  Result Value Ref Range   Glucose-Capillary 195 (H) 70 - 99 mg/dL  Glucose, capillary     Status: Abnormal   Collection Time: 09/07/14  4:40 PM  Result Value Ref Range   Glucose-Capillary 130 (H) 70 - 99 mg/dL  Glucose, capillary     Status: Abnormal   Collection Time: 09/07/14  9:19 PM  Result Value Ref Range   Glucose-Capillary 182 (H) 70 - 99 mg/dL  Glucose, capillary     Status: Abnormal   Collection Time: 09/08/14  6:35 AM  Result Value Ref Range   Glucose-Capillary 134 (H) 70 - 99 mg/dL     HEENT: normal Cardio: RRR and no murmur Resp: CTA B/L and unlabored GI: BS positive and NT, ND Extremity:  Pulses positive and No Edema Skin:   Intact Neuro: Alert/Oriented, Normal Sensory, Abnormal Motor 3-/5 L Bi, Tri,Grip, HF, 4/5 KE,  5/5 on right and Other Left Homonymous hemianopsia is persistent Musc/Skel:  Normal and Other no pain with AROM BUE and BLE Gen NAD Psych: mood pleasant, cooperative   Assessment/Plan: 1. Functional deficits secondary to Right temporal occipital lobe infarct, posterior cerebral  artery distribution which require 3+ hours  per day of interdisciplinary therapy in a comprehensive inpatient rehab setting. Physiatrist is providing close team supervision and 24 hour management of active medical problems listed below. Physiatrist and rehab team continue to assess barriers to discharge/monitor patient progress toward functional and medical goals. FIM: FIM - Bathing Bathing Steps Patient Completed: Chest, Right Arm, Left Arm, Abdomen, Front perineal area, Buttocks, Right upper leg, Left upper leg, Right lower leg (including foot), Left lower leg (including foot) Bathing: 4: Steadying assist  FIM - Upper Body Dressing/Undressing Upper body dressing/undressing steps patient completed: Thread/unthread right bra strap, Thread/unthread right sleeve of pullover shirt/dresss, Thread/unthread left sleeve of pullover shirt/dress, Put head through opening of pull over shirt/dress, Pull shirt over trunk Upper body dressing/undressing: 3: Mod-Patient completed 50-74% of tasks FIM - Lower Body Dressing/Undressing Lower body dressing/undressing steps patient completed: Thread/unthread right underwear leg, Thread/unthread left underwear leg, Pull underwear up/down, Thread/unthread right pants leg, Thread/unthread left pants leg, Pull pants up/down Lower body dressing/undressing: 4: Steadying Assist  FIM - Toileting Toileting steps completed by patient: Adjust clothing prior to toileting, Performs perineal hygiene, Adjust clothing after toileting Toileting Assistive Devices: Grab bar or rail for support Toileting: 6: Assistive device: No helper  FIM - Radio producer Devices: Insurance account manager Transfers: 4-To toilet/BSC: Min A (steadying Pt. > 75%), 4-From toilet/BSC: Min A (steadying Pt. > 75%)  FIM - Bed/Chair Transfer Bed/Chair Transfer: 5: Supine > Sit: Supervision (verbal cues/safety issues), 5: Sit > Supine: Supervision (verbal cues/safety issues), 3: Chair or W/C > Bed: Mod A (lift or lower assist), 3:  Bed > Chair or W/C: Mod A (lift or lower assist)  FIM - Locomotion: Wheelchair Distance: 12 Locomotion: Wheelchair: 0: Activity did not occur FIM - Locomotion: Ambulation Locomotion: Ambulation Assistive Devices: Other (comment) (L HHA) Ambulation/Gait Assistance: 3: Mod assist Locomotion: Ambulation: 2: Travels 50 - 149 ft with moderate assistance (Pt: 50 - 74%)  Comprehension Comprehension Mode: Auditory Comprehension: 5-Follows basic conversation/direction: With extra time/assistive device  Expression Expression Mode: Verbal Expression: 5-Expresses basic needs/ideas: With extra time/assistive device  Social Interaction Social Interaction: 5-Interacts appropriately 90% of the time - Needs monitoring or encouragement for participation or interaction.  Problem Solving Problem Solving Mode: Not assessed Problem Solving: 3-Solves basic 50 - 74% of the time/requires cueing 25 - 49% of the time  Memory Memory Mode: Not assessed Memory: 3-Recognizes or recalls 50 - 74% of the time/requires cueing 25 - 49% of the time  Medical Problem List and Plan: 1. Functional deficits secondary to right brain infarct with left hemiparesis. Status post loop recorder 09/03/2014 2. DVT Prophylaxis/Anticoagulation: Subcutaneous Lovenox. Monitor platelet counts and any signs of bleeding. 3. Pain Management: Tylenol as needed 4. Diabetes mellitus with peripheral neuropathy. Hemoglobin A1c 7.2. Check blood sugars before meals and at bedtime. Presently on sliding scale insulin. Patient on Glucophage 500 mg daily prior to admission. Add low dose amaryl given Cr 5. Neuropsych: This patient is capable of making decisions on her own behalf. 6. Skin/Wound Care: Routine skin checks 7. Fluids/Electrolytes/Nutrition:encourage po 8. Hypertension. Presently on Tenormin 50 mg daily, hydrochlorothiazide 25 mg daily, Avapro 300 mg daily. Monitor with increased mobility. 9. Hyperlipidemia. Lipitor  LOS (Days) 3 A  FACE TO FACE EVALUATION WAS PERFORMED  SWARTZ,ZACHARY T 09/08/2014, 8:43 AM

## 2014-09-09 ENCOUNTER — Inpatient Hospital Stay (HOSPITAL_COMMUNITY): Payer: Medicare HMO | Admitting: Occupational Therapy

## 2014-09-09 ENCOUNTER — Inpatient Hospital Stay (HOSPITAL_COMMUNITY): Payer: Medicare HMO | Admitting: Physical Therapy

## 2014-09-09 ENCOUNTER — Inpatient Hospital Stay (HOSPITAL_COMMUNITY): Payer: Medicare HMO | Admitting: Speech Pathology

## 2014-09-09 DIAGNOSIS — I63531 Cerebral infarction due to unspecified occlusion or stenosis of right posterior cerebral artery: Secondary | ICD-10-CM

## 2014-09-09 LAB — GLUCOSE, CAPILLARY
GLUCOSE-CAPILLARY: 134 mg/dL — AB (ref 70–99)
Glucose-Capillary: 145 mg/dL — ABNORMAL HIGH (ref 70–99)
Glucose-Capillary: 189 mg/dL — ABNORMAL HIGH (ref 70–99)

## 2014-09-09 NOTE — Progress Notes (Signed)
Physical Therapy Session Note  Patient Details  Name: Kayla [name] MRN: [id number] Date of Birth: 08-01-1940  Today's Date: 09/09/2014 PT Individual Time: 0830-0930 PT Individual Time Calculation (min): 60 min   Short Term Goals: Week 1:  PT Short Term Goal 1 (Week 1): Pt will transfer from bed <> w/c with supervision and 25% cueing for attention to L visual field/L side of body. PT Short Term Goal 2 (Week 1): Pt will ambulate x150' in controlled environment using LRAD with supervision and 25% cueing for visual scanning to L of midline. PT Short Term Goal 3 (Week 1): Pt will negotiate 3 stairs with 2 rails requiring supervision and 25% cueing for sequencing, attention to [place]. PT Short Term Goal 4 (Week 1): Pt will ambulate x50' in home environment with LRAD and 25% cueing for visual scanning and obstacle negotiation.  Skilled Therapeutic Interventions/Progress Updates:  Pt received in w/c; agreeable to therapy.  Pt required min assist w/c<>toilet.  Pt functionally ambulated using rolling walker with min guard/min A >150' requiring mod verbal cues for walker advancement, safe speed and proximity to walker.  Pt able to stand >4 minutes bearing most weight on R LE, while playing Connect Four (positioned to L of midline) with max cues to bring attention to L side and functional use of L UE.  Pt able to stand 1 min with step under R LE to promote increased weight bearing on L LE.  Pt required mod verbal cueing to attend to L as she ambulated in controlled environment while looking for numbered sticky notes (placed within a 55ft radius within a foot of eye level) to address dual tasking, attention to L visual field, and reaching outside BOS.  In the first trial, sticky notes were in numerical order, and in the second trial sticky notes were random (and pt successfully located only even numbers as cued).  Pt functionally ambulated back to room with RW min assist/min guard and w/c follow, with max  verbal cues to utilize external aids/signs for path finding to room.  Pt left in w/c with all needs within reach and husband present.      Therapy Documentation Precautions:  Precautions Precautions: Fall Precaution Comments: L hemi Restrictions Weight Bearing Restrictions: No Pain: Pain Assessment Pain Assessment: 0-10 Pain Score: 6  Pain Location: Knee Pain Orientation: Right Pain Onset: With Activity (pt complains of R knee pain after therex ) Locomotion : Ambulation Ambulation/Gait Assistance: 4: Min assist;4: Min guard   See FIM for current functional status  Therapy/Group: Individual Therapy  Mccormick Mac[place], SPT 09/09/2014, 10:49 AM

## 2014-09-09 NOTE — Progress Notes (Signed)
74 y.o. right handed female with history of hypertension, diabetes mellitus and peripheral neuropathy, sickle cell trait and recent bronchitis. Independently with occasional cane prior to admission living with her husband. Admitted 08/30/2014 with altered mental status and left-sided weakness. MRI of the brain showed acute nonhemorrhagic infarct involving the right occipital lobe and posterior inferior right temporal lobe. MRA of the head showed high-grade tandem stenosis of the distal right M1 segment and associated proximal right M2 segment. Carotid Dopplers with no ICA stenosis. Patient did not receive TPA. TEE 09/02/2014 with normal LV function, negative saline cavitation study. Subjective/Complaints: occ cough, dry, no SOB  Review of Systems - Negative except as above   Objective: Vital Signs: Blood pressure 138/78, pulse 90, temperature 98.7 F (37.1 C), temperature source Oral, resp. rate 18, height 5\' 6"  (1.676 m), weight 89 kg (196 lb 3.4 oz), SpO2 96 %. No results found. Results for orders placed or performed during the hospital encounter of 09/05/14 (from the past 72 hour(s))  Glucose, capillary     Status: Abnormal   Collection Time: 09/06/14 11:51 AM  Result Value Ref Range   Glucose-Capillary 155 (H) 70 - 99 mg/dL   Comment 1 Notify RN   Glucose, capillary     Status: Abnormal   Collection Time: 09/06/14  4:54 PM  Result Value Ref Range   Glucose-Capillary 157 (H) 70 - 99 mg/dL   Comment 1 Notify RN   Glucose, capillary     Status: Abnormal   Collection Time: 09/06/14  9:14 PM  Result Value Ref Range   Glucose-Capillary 217 (H) 70 - 99 mg/dL   Comment 1 Notify RN   Glucose, capillary     Status: Abnormal   Collection Time: 09/07/14  6:58 AM  Result Value Ref Range   Glucose-Capillary 152 (H) 70 - 99 mg/dL   Comment 1 Notify RN   Glucose, capillary     Status: Abnormal   Collection Time: 09/07/14 11:37 AM  Result Value Ref Range   Glucose-Capillary 195 (H) 70 - 99  mg/dL  Glucose, capillary     Status: Abnormal   Collection Time: 09/07/14  4:40 PM  Result Value Ref Range   Glucose-Capillary 130 (H) 70 - 99 mg/dL  Glucose, capillary     Status: Abnormal   Collection Time: 09/07/14  9:19 PM  Result Value Ref Range   Glucose-Capillary 182 (H) 70 - 99 mg/dL  Glucose, capillary     Status: Abnormal   Collection Time: 09/08/14  6:35 AM  Result Value Ref Range   Glucose-Capillary 134 (H) 70 - 99 mg/dL  Glucose, capillary     Status: Abnormal   Collection Time: 09/08/14 11:45 AM  Result Value Ref Range   Glucose-Capillary 282 (H) 70 - 99 mg/dL   Comment 1 Notify RN   Glucose, capillary     Status: Abnormal   Collection Time: 09/08/14  4:43 PM  Result Value Ref Range   Glucose-Capillary 139 (H) 70 - 99 mg/dL  Glucose, capillary     Status: Abnormal   Collection Time: 09/08/14  9:10 PM  Result Value Ref Range   Glucose-Capillary 176 (H) 70 - 99 mg/dL  Glucose, capillary     Status: Abnormal   Collection Time: 09/09/14  7:01 AM  Result Value Ref Range   Glucose-Capillary 145 (H) 70 - 99 mg/dL     HEENT: normal Cardio: RRR and no murmur Resp: CTA B/L and unlabored GI: BS positive and NT, ND Extremity:  Pulses positive and No Edema Skin:   Intact Neuro: Alert/Oriented, Normal Sensory, Abnormal Motor 3-/5 L Bi, Tri,Grip, HF, 4/5 KE,  5/5 on right and Other Left Homonymous hemianopsia Musc/Skel:  Normal and Other no pain with AROM BUE and BLE Gen NAD   Assessment/Plan: 1. Functional deficits secondary to Right temporal occipital lobe infarct, posterior cerebral artery distribution which require 3+ hours per day of interdisciplinary therapy in a comprehensive inpatient rehab setting. Physiatrist is providing close team supervision and 24 hour management of active medical problems listed below. Physiatrist and rehab team continue to assess barriers to discharge/monitor patient progress toward functional and medical goals. FIM: FIM -  Bathing Bathing Steps Patient Completed: Chest, Right Arm, Left Arm, Abdomen, Front perineal area, Buttocks, Right upper leg, Left upper leg, Right lower leg (including foot), Left lower leg (including foot) Bathing: 4: Steadying assist  FIM - Upper Body Dressing/Undressing Upper body dressing/undressing steps patient completed: Thread/unthread right bra strap, Thread/unthread right sleeve of pullover shirt/dresss, Thread/unthread left sleeve of pullover shirt/dress, Put head through opening of pull over shirt/dress, Pull shirt over trunk Upper body dressing/undressing: 3: Mod-Patient completed 50-74% of tasks FIM - Lower Body Dressing/Undressing Lower body dressing/undressing steps patient completed: Thread/unthread right underwear leg, Thread/unthread left underwear leg, Pull underwear up/down, Thread/unthread right pants leg, Thread/unthread left pants leg, Pull pants up/down Lower body dressing/undressing: 4: Steadying Assist  FIM - Toileting Toileting steps completed by patient: Adjust clothing prior to toileting, Performs perineal hygiene, Adjust clothing after toileting Toileting Assistive Devices: Grab bar or rail for support Toileting: 6: Assistive device: No helper  FIM - Radio producer Devices: Insurance account manager Transfers: 4-To toilet/BSC: Min A (steadying Pt. > 75%), 4-From toilet/BSC: Min A (steadying Pt. > 75%)  FIM - Bed/Chair Transfer Bed/Chair Transfer Assistive Devices: Bed rails Bed/Chair Transfer: 5: Supine > Sit: Supervision (verbal cues/safety issues), 5: Bed > Chair or W/C: Supervision (verbal cues/safety issues), 5: Chair or W/C > Bed: Supervision (verbal cues/safety issues)  FIM - Locomotion: Wheelchair Distance: 160 Locomotion: Wheelchair: 5: Travels 150 ft or more: maneuvers on rugs and over door sills with supervision, cueing or coaxing FIM - Locomotion: Ambulation Locomotion: Ambulation Assistive Devices: Astronomer Ambulation/Gait Assistance: 4: Min guard, 5: Supervision Locomotion: Ambulation: 4: Travels 150 ft or more with minimal assistance (Pt.>75%)  Comprehension Comprehension Mode: Auditory Comprehension: 5-Follows basic conversation/direction: With extra time/assistive device  Expression Expression Mode: Verbal Expression: 5-Expresses basic needs/ideas: With extra time/assistive device  Social Interaction Social Interaction: 5-Interacts appropriately 90% of the time - Needs monitoring or encouragement for participation or interaction.  Problem Solving Problem Solving Mode: Not assessed Problem Solving: 3-Solves basic 50 - 74% of the time/requires cueing 25 - 49% of the time  Memory Memory Mode: Not assessed Memory: 3-Recognizes or recalls 50 - 74% of the time/requires cueing 25 - 49% of the time  Medical Problem List and Plan: 1. Functional deficits secondary to right brain infarct with left hemiparesis. Status post loop recorder 09/03/2014 2. DVT Prophylaxis/Anticoagulation: Subcutaneous Lovenox. Monitor platelet counts and any signs of bleeding. 3. Pain Management: Tylenol as needed 4. Diabetes mellitus with peripheral neuropathy. Hemoglobin A1c 7.2. Check blood sugars before meals and at bedtime. Presently on sliding scale insulin. Patient on Glucophage 500 mg daily prior to admission. Resume as tolerated, elevated creat will hold off for now, consider amaryl 5. Neuropsych: This patient is capable of making decisions on her own behalf. 6. Skin/Wound Care: Routine skin checks 7. Fluids/Electrolytes/Nutrition: Strict  I and O's with follow-up chemistries 8. Hypertension. Presently on Tenormin 50 mg daily, hydrochlorothiazide 25 mg daily, Avapro 300 mg daily. Monitor with increased mobility. 9. Hyperlipidemia. Lipitor  LOS (Days) 4 A FACE TO FACE EVALUATION WAS PERFORMED  Elenie Coven E 09/09/2014, 7:43 AM

## 2014-09-09 NOTE — Progress Notes (Signed)
Occupational Therapy Session Note  Patient Details  Name: Kayla [name] MRN: [id number] Date of Birth: [birth year]  Today's Date: 09/09/2014 OT Individual Time: 1105-1205 OT Individual Time Calculation (min): 60 min    Short Term Goals: Week 1:  OT Short Term Goal 1 (Week 1): Pt will scan to Lt to obtain items to complete self-care tasks with verbal cues <10% of time. OT Short Term Goal 2 (Week 1): Pt will complete bathing with supervision at sit > stand level OT Short Term Goal 3 (Week 1): Pt will complete UB dressing with min assist OT Short Term Goal 4 (Week 1): Pt will complete LB dressing with min assist OT Short Term Goal 5 (Week 1): Pt will complete toilet transfer with supervision with LRAD  Skilled Therapeutic Interventions/Progress Updates:    Engaged in ADL retraining with focus on functional mobility, transfers, attention to Lt environment, and functional use of LUE.  Pt ambulated to room shower with min/steady assist with step together pattern, min cue for step through with ambulation.  Pt completed toilet and shower chair transfer with min/steady assist due to decreased visual attention to Lt side.  Min/steady assist when standing to wash buttocks, educated on sitting to dry body post shower to increase safety as pt attempting to dry feet by bending over and single leg stance in damp shower.  Pt completed dressing tasks seated at EOB with clothes on Lt side with mod verbal cues to scan to Lt to obtain items.  Ambulated to Day Room with RW and min/steady assist with pt tending to drift to Lt during ambulation.  Engaged in Connect 4 with checkers set to pt's left to promote scanning to Lt and then increased challenge with in-hand manipulation and translation of checkers.  Pt left seated in w/c with son present, encouraged to sit up through lunch.  Therapy Documentation Precautions:  Precautions Precautions: Fall Precaution Comments: L hemi Restrictions Weight Bearing  Restrictions: No General:   Vital Signs: Therapy Vitals Temp: 98.8 F (37.1 C) Temp Source: Oral Pulse Rate: 85 Resp: 18 BP: 121/64 mmHg Patient Position (if appropriate): Sitting Oxygen Therapy SpO2: 97 % O2 Device: Not Delivered Pain: Pt with no c/o pain  See FIM for current functional status  Therapy/Group: Individual Therapy  Simonne Come 09/09/2014, 3:37 PM

## 2014-09-09 NOTE — Progress Notes (Signed)
Speech Language Pathology Daily Session Note  Patient Details  Name: MERLY HINKSON MRN: [id number] Date of Birth: 1940-07-26  Today's Date: 09/09/2014 SLP Individual Time: 0930-1030 SLP Individual Time Calculation (min): 60 min  Short Term Goals: Week 1: SLP Short Term Goal 1 (Week 1): Patient will demonstrate basic problem-solving in 75% of observable opportunities given Mod A verbal, visual, and tactile cues. SLP Short Term Goal 2 (Week 1): Patient will utilize external aids to assist in recall of new, daily information over 3 consecutive sessions given Min A verbal and visual cues. SLP Short Term Goal 3 (Week 1): Patient will demonstrate alternating attention between functional tasks in a quiet environment for 5 minutes given Min A verbal and visual cues. SLP Short Term Goal 4 (Week 1): Patient will self-monitor and correct errors during self-care and home management tasks given Min A question cues. SLP Short Term Goal 5 (Week 1): Patient will attend to left of environment in 75% of observable opportunities given Min A verbal, visual, and tactile cues.  Skilled Therapeutic Interventions: Skilled treatment session focused on addressing cognitive-linguistic goals.  SLP facilitated session with continuation of a semi-complex home management task that was initiated during last visit.  Task targeted functional problem solving and left attention and Mod assist hand-over-hand assist and Max assist verbal and visual cues to facilitate improved left attention and delayed recall when looking for given items in a grocery store flyer.  Patient continues to require simplification of visual stimuli to focus her visual attention during functional tasks.  Continue with current plan of care.      FIM:  Comprehension Comprehension Mode: Auditory Comprehension: 4-Understands basic 75 - 89% of the time/requires cueing 10 - 24% of the time Expression Expression Mode: Verbal Expression: 5-Expresses basic  needs/ideas: With extra time/assistive device Social Interaction Social Interaction: 5-Interacts appropriately 90% of the time - Needs monitoring or encouragement for participation or interaction. Problem Solving Problem Solving: 2-Solves basic 25 - 49% of the time - needs direction more than half the time to initiate, plan or complete simple activities Memory Memory: 2-Recognizes or recalls 25 - 49% of the time/requires cueing 51 - 75% of the time  Pain Pain Assessment Pain Assessment: No/denies pain  Therapy/Group: Individual Therapy  Carmelia Roller., [place] [phone]  [place] 09/09/2014, 4:23 PM

## 2014-09-10 ENCOUNTER — Inpatient Hospital Stay (HOSPITAL_COMMUNITY): Payer: Medicare HMO

## 2014-09-10 ENCOUNTER — Inpatient Hospital Stay (HOSPITAL_COMMUNITY): Payer: Medicare HMO | Admitting: Occupational Therapy

## 2014-09-10 ENCOUNTER — Inpatient Hospital Stay (HOSPITAL_COMMUNITY): Payer: Medicare HMO | Admitting: Speech Pathology

## 2014-09-10 LAB — GLUCOSE, CAPILLARY
GLUCOSE-CAPILLARY: 134 mg/dL — AB (ref 70–99)
Glucose-Capillary: 150 mg/dL — ABNORMAL HIGH (ref 70–99)
Glucose-Capillary: 97 mg/dL (ref 70–99)
Glucose-Capillary: 230 mg/dL — ABNORMAL HIGH (ref 70–99)

## 2014-09-10 NOTE — Progress Notes (Signed)
Occupational Therapy Session Note  Patient Details  Name: Kayla [name] MRN: [id number] Date of Birth: May 06, 1941  Today's Date: 09/10/2014 OT Individual Time: 1400-1500 OT Individual Time Calculation (min): 60 min    Short Term Goals: Week 1:  OT Short Term Goal 1 (Week 1): Pt will scan to Lt to obtain items to complete self-care tasks with verbal cues <10% of time. OT Short Term Goal 2 (Week 1): Pt will complete bathing with supervision at sit > stand level OT Short Term Goal 3 (Week 1): Pt will complete UB dressing with min assist OT Short Term Goal 4 (Week 1): Pt will complete LB dressing with min assist OT Short Term Goal 5 (Week 1): Pt will complete toilet transfer with supervision with LRAD  Skilled Therapeutic Interventions/Progress Updates:    Engaged in therapeutic activity with focus on functional mobility and visual attention to Lt environment during seated, standing, and ambulatory tasks.  Upon arrival, pt utilizing call bell to go to the bathroom.  Ambulated with min/steady assist without AD to toilet where pt completed all toileting tasks with supervision.  Pt declined shower this session.  Ambulated to Day Room with RW with pt veering to Lt during ambulation, requiring min tactile cues to promote straight navigation.  Engaged in seated therapeutic activity with focus on Lt attention and visually scanning to Lt field while completing scavenger hunt in a magazine.  Progressed to standing activity with focus on use of LUE and scanning to Lt visual field to obtain items to complete matching activity.  Engaged in dynamic sitting balance with reaching outside BOS during seated rest break. Forced use of LUE and scanning to Lt while reaching behind pt to return matching activity to box placed behind pt on Lt.  Therapy Documentation Precautions:  Precautions Precautions: Fall Precaution Comments: L hemi Restrictions Weight Bearing Restrictions: No General:   Vital Signs: Therapy  Vitals Temp: 97.6 F (36.4 C) Temp Source: Oral Pulse Rate: 72 Resp: 16 BP: 112/65 mmHg Patient Position (if appropriate): Sitting Oxygen Therapy SpO2: 96 % O2 Device: Not Delivered Pain: Pain Assessment Pain Assessment: No/denies pain  See FIM for current functional status  Therapy/Group: Individual Therapy  Simonne Come 09/10/2014, 3:41 PM

## 2014-09-10 NOTE — Progress Notes (Signed)
Physical Therapy Session Note  Patient Details  Name: Kayla [name] MRN: [id number] Date of Birth: January 12, 1941  Today's Date: 09/10/2014 PT Individual Time: 1500-1530 PT Individual Time Calculation (min): 30 min   Short Term Goals: Week 1:  PT Short Term Goal 1 (Week 1): Pt will transfer from bed <> w/c with supervision and 25% cueing for attention to L visual field/L side of body. PT Short Term Goal 2 (Week 1): Pt will ambulate x150' in controlled environment using LRAD with supervision and 25% cueing for visual scanning to L of midline. PT Short Term Goal 3 (Week 1): Pt will negotiate 3 stairs with 2 rails requiring supervision and 25% cueing for sequencing, attention to [place]. PT Short Term Goal 4 (Week 1): Pt will ambulate x50' in home environment with LRAD and 25% cueing for visual scanning and obstacle negotiation.  Skilled Therapeutic Interventions/Progress Updates:    Session focused on household environment gait on carpeted surfaces including obstacle negotiation with RW (min to mod verbal cues needed for safety), gait to/from therapy gym with min A with focus on attention to L for obstacles, L heel strike, upright posture, and maintaining body inside of RW for safety, and Nustep for neuro re-ed to LUE and LLE (with no use of RUE allowed) x 5 min on level 5. Cues for use of RW during transfers and safe technique; pt demonstrating decreased awareness to L side during pathfinding activity back to room as well as decreased safety awareness with use of RW. Positioned in w/c with safety belt on and family in room.   Therapy Documentation Precautions:  Precautions Precautions: Fall Precaution Comments: L hemi Restrictions Weight Bearing Restrictions: No Pain: Pain Assessment Pain Assessment: No/denies pain Locomotion : Ambulation Ambulation/Gait Assistance: 4: Min assist   See FIM for current functional status  Therapy/Group: Individual Therapy  Canary Brim  [facility] 09/10/2014, 3:30 PM

## 2014-09-10 NOTE — Progress Notes (Signed)
74 y.o. right handed female with history of hypertension, diabetes mellitus and peripheral neuropathy, sickle cell trait and recent bronchitis. Independently with occasional cane prior to admission living with her husband. Admitted 08/30/2014 with altered mental status and left-sided weakness. MRI of the brain showed acute nonhemorrhagic infarct involving the right occipital lobe and posterior inferior right temporal lobe. MRA of the head showed high-grade tandem stenosis of the distal right M1 segment and associated proximal right M2 segment. Carotid Dopplers with no ICA stenosis. Patient did not receive TPA. TEE 09/02/2014 with normal LV function, negative saline cavitation study. Subjective/Complaints: No issues overnite, poor awareness of left side, poor compensatory strategies  Review of Systems - Negative except as above   Objective: Vital Signs: Blood pressure 134/66, pulse 72, temperature 98.5 F (36.9 C), temperature source Oral, resp. rate 18, height 5\' 6"  (1.676 m), weight 89 kg (196 lb 3.4 oz), SpO2 96 %. No results found. Results for orders placed or performed during the hospital encounter of 09/05/14 (from the past 72 hour(s))  Glucose, capillary     Status: Abnormal   Collection Time: 09/07/14 11:37 AM  Result Value Ref Range   Glucose-Capillary 195 (H) 70 - 99 mg/dL  Glucose, capillary     Status: Abnormal   Collection Time: 09/07/14  4:40 PM  Result Value Ref Range   Glucose-Capillary 130 (H) 70 - 99 mg/dL  Glucose, capillary     Status: Abnormal   Collection Time: 09/07/14  9:19 PM  Result Value Ref Range   Glucose-Capillary 182 (H) 70 - 99 mg/dL  Glucose, capillary     Status: Abnormal   Collection Time: 09/08/14  6:35 AM  Result Value Ref Range   Glucose-Capillary 134 (H) 70 - 99 mg/dL  Glucose, capillary     Status: Abnormal   Collection Time: 09/08/14 11:45 AM  Result Value Ref Range   Glucose-Capillary 282 (H) 70 - 99 mg/dL   Comment 1 Notify RN   Glucose,  capillary     Status: Abnormal   Collection Time: 09/08/14  4:43 PM  Result Value Ref Range   Glucose-Capillary 139 (H) 70 - 99 mg/dL  Glucose, capillary     Status: Abnormal   Collection Time: 09/08/14  9:10 PM  Result Value Ref Range   Glucose-Capillary 176 (H) 70 - 99 mg/dL  Glucose, capillary     Status: Abnormal   Collection Time: 09/09/14  7:01 AM  Result Value Ref Range   Glucose-Capillary 145 (H) 70 - 99 mg/dL  Glucose, capillary     Status: Abnormal   Collection Time: 09/09/14  4:51 PM  Result Value Ref Range   Glucose-Capillary 134 (H) 70 - 99 mg/dL  Glucose, capillary     Status: Abnormal   Collection Time: 09/09/14  9:14 PM  Result Value Ref Range   Glucose-Capillary 189 (H) 70 - 99 mg/dL  Glucose, capillary     Status: Abnormal   Collection Time: 09/10/14  6:50 AM  Result Value Ref Range   Glucose-Capillary 150 (H) 70 - 99 mg/dL   Comment 1 Notify RN      HEENT: normal Cardio: RRR and no murmur Resp: CTA B/L and unlabored GI: BS positive and NT, ND Extremity:  Pulses positive and No Edema Skin:   Intact Neuro: Alert/Oriented, Normal Sensory, Abnormal Motor 3-/5 L Bi, Tri,Grip, HF, 4/5 KE,  5/5 on right and Other Left Homonymous hemianopsia Musc/Skel:  Normal and Other no pain with AROM BUE and BLE Gen NAD  Assessment/Plan: 1. Functional deficits secondary to Right temporal occipital lobe infarct, posterior cerebral artery distribution which require 3+ hours per day of interdisciplinary therapy in a comprehensive inpatient rehab setting. Physiatrist is providing close team supervision and 24 hour management of active medical problems listed below. Physiatrist and rehab team continue to assess barriers to discharge/monitor patient progress toward functional and medical goals. Team conf in am FIM: FIM - Bathing Bathing Steps Patient Completed: Chest, Right Arm, Left Arm, Abdomen, Front perineal area, Buttocks, Right upper leg, Left upper leg, Right lower leg  (including foot), Left lower leg (including foot) Bathing: 4: Steadying assist  FIM - Upper Body Dressing/Undressing Upper body dressing/undressing steps patient completed: Thread/unthread right bra strap, Thread/unthread left bra strap, Hook/unhook bra, Thread/unthread right sleeve of pullover shirt/dresss, Pull shirt over trunk, Put head through opening of pull over shirt/dress, Thread/unthread left sleeve of pullover shirt/dress Upper body dressing/undressing: 5: Supervision: Safety issues/verbal cues FIM - Lower Body Dressing/Undressing Lower body dressing/undressing steps patient completed: Thread/unthread right underwear leg, Thread/unthread left underwear leg, Pull underwear up/down, Thread/unthread right pants leg, Thread/unthread left pants leg, Pull pants up/down, Don/Doff right shoe, Don/Doff left shoe, Fasten/unfasten right shoe, Fasten/unfasten left shoe Lower body dressing/undressing: 5: Supervision: Safety issues/verbal cues  FIM - Toileting Toileting steps completed by patient: Adjust clothing prior to toileting, Performs perineal hygiene Toileting Assistive Devices: Grab bar or rail for support Toileting: 5: Supervision: Safety issues/verbal cues  FIM - Radio producer Devices: Grab bars Toilet Transfers: 5-To toilet/BSC: Supervision (verbal cues/safety issues), 4-To toilet/BSC: Min A (steadying Pt. > 75%), 4-From toilet/BSC: Min A (steadying Pt. > 75%)  FIM - Bed/Chair Transfer Bed/Chair Transfer Assistive Devices: Bed rails Bed/Chair Transfer: 5: Supine > Sit: Supervision (verbal cues/safety issues), 5: Bed > Chair or W/C: Supervision (verbal cues/safety issues), 5: Chair or W/C > Bed: Supervision (verbal cues/safety issues)  FIM - Locomotion: Wheelchair Distance: 160 Locomotion: Wheelchair: 1: Total Assistance/staff pushes wheelchair (Pt<25%) FIM - Locomotion: Ambulation Locomotion: Ambulation Assistive Devices: Astronomer Ambulation/Gait Assistance: 4: Min assist Locomotion: Ambulation: 2: Travels 50 - 149 ft with minimal assistance (Pt.>75%)  Comprehension Comprehension Mode: Auditory Comprehension: 5-Follows basic conversation/direction: With extra time/assistive device  Expression Expression Mode: Verbal Expression: 5-Expresses basic needs/ideas: With extra time/assistive device  Social Interaction Social Interaction: 5-Interacts appropriately 90% of the time - Needs monitoring or encouragement for participation or interaction.  Problem Solving Problem Solving Mode: Not assessed Problem Solving: 3-Solves basic 50 - 74% of the time/requires cueing 25 - 49% of the time  Memory Memory Mode: Not assessed Memory: 2-Recognizes or recalls 25 - 49% of the time/requires cueing 51 - 75% of the time  Medical Problem List and Plan: 1. Functional deficits secondary to right brain infarct with left hemiparesis. Status post loop recorder 09/03/2014 2. DVT Prophylaxis/Anticoagulation: Subcutaneous Lovenox. Monitor platelet counts and any signs of bleeding. 3. Pain Management: Tylenol as needed 4. Diabetes mellitus with peripheral neuropathy. Hemoglobin A1c 7.2. Check blood sugars before meals and at bedtime. Presently on sliding scale insulin. Patient on Glucophage 500 mg daily prior to admission. Resume as tolerated, elevated creat will hold off for now, consider amaryl 5. Neuropsych: This patient is capable of making decisions on her own behalf. 6. Skin/Wound Care: Routine skin checks 7. Fluids/Electrolytes/Nutrition: Strict I and O's with follow-up chemistries 8. Hypertension. Presently on Tenormin 50 mg daily, hydrochlorothiazide 25 mg daily, Avapro 300 mg daily. Monitor with increased mobility. 9. Hyperlipidemia. Lipitor  LOS (Days) 5 A FACE  TO FACE EVALUATION WAS PERFORMED  Myrtie Leuthold E 09/10/2014, 8:08 AM

## 2014-09-10 NOTE — Progress Notes (Signed)
Speech Language Pathology Daily Session Note  Patient Details  Name: Kayla [name] MRN: [id number] Date of Birth: 12-12-1940  Today's Date: 09/10/2014 SLP Individual Time: 0800-0900 SLP Individual Time Calculation (min): 60 min  Short Term Goals: Week 1: SLP Short Term Goal 1 (Week 1): Patient will demonstrate basic problem-solving in 75% of observable opportunities given Mod A verbal, visual, and tactile cues. SLP Short Term Goal 2 (Week 1): Patient will utilize external aids to assist in recall of new, daily information over 3 consecutive sessions given Min A verbal and visual cues. SLP Short Term Goal 3 (Week 1): Patient will demonstrate alternating attention between functional tasks in a quiet environment for 5 minutes given Min A verbal and visual cues. SLP Short Term Goal 4 (Week 1): Patient will self-monitor and correct errors during self-care and home management tasks given Min A question cues. SLP Short Term Goal 5 (Week 1): Patient will attend to left of environment in 75% of observable opportunities given Min A verbal, visual, and tactile cues.  Skilled Therapeutic Interventions: Skilled treatment session focused on addressing cognitive-linguistic goals.  SLP facilitated session with set up of a complex medication management that targeted functional problem solving and left attention with Mod assist verbal and visual cues for left attention and Min verbal cues for self-monitoring of errors.  Once patient was aware of errors she was able to correct them with Supervision level verbal cues.  Patient performed better in today's task due to the simplification of visual field/stimuli given medication box.  Continue with current plan of care.      FIM:  Comprehension Comprehension Mode: Auditory Comprehension: 4-Understands basic 75 - 89% of the time/requires cueing 10 - 24% of the time Expression Expression Mode: Verbal Expression: 5-Expresses basic needs/ideas: With extra  time/assistive device Social Interaction Social Interaction: 5-Interacts appropriately 90% of the time - Needs monitoring or encouragement for participation or interaction. Problem Solving Problem Solving: 3-Solves basic 50 - 74% of the time/requires cueing 25 - 49% of the time Memory Memory: 3-Recognizes or recalls 50 - 74% of the time/requires cueing 25 - 49% of the time  Pain Pain Assessment Pain Assessment: No/denies pain  Therapy/Group: Individual Therapy  Carmelia Roller., CCC-SLP [phone]  [name] 09/10/2014, 11:51 AM

## 2014-09-10 NOTE — Progress Notes (Signed)
Physical Therapy Session Note  Patient Details  Name: Kayla [name] MRN: [id number] Date of Birth: 1940-09-08  Today's Date: 09/10/2014 PT Individual Time: 1100-1130 PT Individual Time Calculation (min): 30 min   Short Term Goals: Week 1:  PT Short Term Goal 1 (Week 1): Pt will transfer from bed <> w/c with supervision and 25% cueing for attention to L visual field/L side of body. PT Short Term Goal 2 (Week 1): Pt will ambulate x150' in controlled environment using LRAD with supervision and 25% cueing for visual scanning to L of midline. PT Short Term Goal 3 (Week 1): Pt will negotiate 3 stairs with 2 rails requiring supervision and 25% cueing for sequencing, attention to [place]. PT Short Term Goal 4 (Week 1): Pt will ambulate x50' in home environment with LRAD and 25% cueing for visual scanning and obstacle negotiation.  Skilled Therapeutic Interventions/Progress Updates:   Session focused on dynamic gait through obstacle course to address balance, awareness/attention, and functional mobility with RW x 2 trials with min to mod verbal cues, neuro re-ed for LLE strengthening, coordination, and functional use with sit to stands with bias for LLE x 5 reps and progressing to this without UE support x 5 reps, and stair negotiation for home entry with min A up/down 3 steps with cues for foot placement and technique. Pt requires cues for attention and awareness to L during mobility and transfers for safety. Propelled w/c x 65' in hallway with BUE for functional strengthening. Positioned in w/c with safety belt donned and family present in room end of session.   Therapy Documentation Precautions:  Precautions Precautions: Fall Precaution Comments: L hemi Restrictions Weight Bearing Restrictions: No Pain: Pain Assessment Pain Assessment: No/denies pain Locomotion : Ambulation Ambulation/Gait Assistance: 4: Min assist   See FIM for current functional status  Therapy/Group: Individual  Therapy  Canary Brim [facility] 09/10/2014, 12:02 PM

## 2014-09-11 ENCOUNTER — Inpatient Hospital Stay (HOSPITAL_COMMUNITY): Payer: Medicare HMO | Admitting: Occupational Therapy

## 2014-09-11 ENCOUNTER — Inpatient Hospital Stay (HOSPITAL_COMMUNITY): Payer: Medicare HMO | Admitting: Speech Pathology

## 2014-09-11 ENCOUNTER — Inpatient Hospital Stay (HOSPITAL_COMMUNITY): Payer: Medicare HMO | Admitting: Physical Therapy

## 2014-09-11 LAB — URINE MICROSCOPIC-ADD ON

## 2014-09-11 LAB — GLUCOSE, CAPILLARY
GLUCOSE-CAPILLARY: 244 mg/dL — AB (ref 70–99)
GLUCOSE-CAPILLARY: 169 mg/dL — AB (ref 70–99)
Glucose-Capillary: 138 mg/dL — ABNORMAL HIGH (ref 70–99)
Glucose-Capillary: 160 mg/dL — ABNORMAL HIGH (ref 70–99)
Glucose-Capillary: 127 mg/dL — ABNORMAL HIGH (ref 70–99)

## 2014-09-11 LAB — URINALYSIS, ROUTINE W REFLEX MICROSCOPIC
Bilirubin Urine: NEGATIVE
GLUCOSE, UA: 250 mg/dL — AB
Hgb urine dipstick: NEGATIVE
Ketones, ur: NEGATIVE mg/dL
Nitrite: NEGATIVE
Protein, ur: NEGATIVE mg/dL
Specific Gravity, Urine: 1.018 (ref 1.005–1.030)
Urobilinogen, UA: 1 mg/dL (ref 0.0–1.0)
pH: 6 (ref 5.0–8.0)

## 2014-09-11 NOTE — Progress Notes (Signed)
Physical Therapy Session Note  Patient Details  Name: Kayla [name] MRN: [id number] Date of Birth: 1941-04-23  Today's Date: 09/11/2014 PT Individual Time: 0930-1030 PT Individual Time Calculation (min): 60 min   Short Term Goals: Week 1:  PT Short Term Goal 1 (Week 1): Pt will transfer from bed <> w/c with supervision and 25% cueing for attention to L visual field/L side of body. PT Short Term Goal 2 (Week 1): Pt will ambulate x150' in controlled environment using LRAD with supervision and 25% cueing for visual scanning to L of midline. PT Short Term Goal 3 (Week 1): Pt will negotiate 3 stairs with 2 rails requiring supervision and 25% cueing for sequencing, attention to [place]. PT Short Term Goal 4 (Week 1): Pt will ambulate x50' in home environment with LRAD and 25% cueing for visual scanning and obstacle negotiation.  Skilled Therapeutic Interventions/Progress Updates:  Pt received in w/c; agreeable to therapy.  Session focused on bed mobility, functional ambulation, and dynamic standing, emphasizing attention to L visual field. Pt mentioned she had a headache and was "loopy", but oriented x4. Therefore, Ted hose donned and vitals determined to be WNL with no significant change sit > stand; see below for detailed readings. RN made aware, and CBGs checked by nurse tech. Pt performed bed mobility with no rails and HOB flat, with supervision and min cueing for 'log roll' method for energy conservation.  Pt functionally ambulated >150' to/from room with rolling walker with min A and mod verbal cueing for attention to L visual field, and manual advancement of rolling walker for obstacle negotiation.  Pt engaged in memory game in standing with min guard sit>stand, requiring mod cueing to attend to cards in L visual field.  Pt left in w/c with quick release belt applied, all needs within reach and son present in room.    Therapy Documentation Precautions:  Precautions Precautions: Fall Precaution  Comments: L hemi Restrictions Weight Bearing Restrictions: No Vital Signs: Therapy Vitals Temp: 98.4 F (36.9 C) Temp Source: Oral Pulse Rate: 84 BP: 119/81 mmHg Patient Position (if appropriate): Standing (Asymptomic) Oxygen Therapy SpO2: 97 % O2 Device: Not Delivered Pain: Pain Assessment Pain Assessment: Faces Faces Pain Scale: Hurts little more Pain Type: Acute pain Pain Location: Head Pain Descriptors / Indicators: Headache Pain Intervention(s): RN made aware;Other (Comment) (Pt given water to drink) Locomotion : Ambulation Ambulation/Gait Assistance: 4: Min guard;4: Min assist   See FIM for current functional status  Therapy/Group: Individual Therapy  Ephraim Reichel 09/11/2014, 11:26 AM

## 2014-09-11 NOTE — Progress Notes (Signed)
Social Work Patient ID: RAGEN LAVER, female   DOB: 23-Oct-1940, 74 y.o.   MRN: [id number] Spoke with daughter via telephone to discuss team conference goals-supervision level due to cognition issues and field cut. She reports unsure if her Dad can provide the care she will need and none of the children live their with them.  She wanted to know If her insurance would cover NHP.  Discussed it is not likely they will cover NH since they covered CIR.  Asked her to come in and attend therapies With pt and bring her Dad.  Will need to see if family can provide the car ept requires or other options need to be explored.

## 2014-09-11 NOTE — Progress Notes (Signed)
Occupational Therapy Session Note  Patient Details  Name: Kayla [name] MRN: [id number] Date of Birth: 1940/12/09  Today's Date: 09/11/2014 OT Individual Time:1:00 pm  - 2:00 pm   60 mins   Short Term Goals: Week 1:  OT Short Term Goal 1 (Week 1): Pt will scan to Lt to obtain items to complete self-care tasks with verbal cues <10% of time. OT Short Term Goal 2 (Week 1): Pt will complete bathing with supervision at sit > stand level OT Short Term Goal 3 (Week 1): Pt will complete UB dressing with min assist OT Short Term Goal 4 (Week 1): Pt will complete LB dressing with min assist OT Short Term Goal 5 (Week 1): Pt will complete toilet transfer with supervision with LRAD  Skilled Therapeutic Interventions/Progress Updates:   Therapist facilitated session in ADL retraining with focus on attention to Lt environment, and functional use of LUE. Pt ambulated to room shower room without use of RW and required steady min A. Pt completed toilet and shower chair transfer with min/steady assist due to decreased visual attention to Lt side. Min/steady assist when standing to wash buttocks, pt educated on using grab bars with L hand if R (dominant hand) is engaged in a self-care activity. Pt completed dressing tasks in shower room due to desireable warm air conditions.Demonstrated good dressing sequence recall reminding therapist deodorant application was routine prior to donning shirt and good carryover from prior education by remaining seated to lean outside BOS for tasks such as drying legs and donning socks. Therapist provided hands on education with son and daughter, per their request, with toilet and bed transfers.  Recommend they transfer pt with w/c at this time to increase safety.  Pt's son and daughter return demonstrated safe transfer technique.     Therapy Documentation Precautions:  Precautions Precautions: Fall Precaution Comments: L hemi Restrictions Weight Bearing Restrictions:  No Pain:  Pt with no c/o pain  See FIM for current functional status  Therapy/Group: Individual Therapy  Everitt Wenner 09/11/2014, 3:30 PM

## 2014-09-11 NOTE — Progress Notes (Signed)
Speech Language Pathology Daily Session Note  Patient Details  Name: Kayla [name] MRN: [id number] Date of Birth: 28-Apr-1941  Today's Date: 09/11/2014 SLP Individual Time: 1400-1500 SLP Individual Time Calculation (min): 60 min  Short Term Goals: Week 1: SLP Short Term Goal 1 (Week 1): Patient will demonstrate basic problem-solving in 75% of observable opportunities given Mod A verbal, visual, and tactile cues. SLP Short Term Goal 2 (Week 1): Patient will utilize external aids to assist in recall of new, daily information over 3 consecutive sessions given Min A verbal and visual cues. SLP Short Term Goal 3 (Week 1): Patient will demonstrate alternating attention between functional tasks in a quiet environment for 5 minutes given Min A verbal and visual cues. SLP Short Term Goal 4 (Week 1): Patient will self-monitor and correct errors during self-care and home management tasks given Min A question cues. SLP Short Term Goal 5 (Week 1): Patient will attend to left of environment in 75% of observable opportunities given Min A verbal, visual, and tactile cues.  Skilled Therapeutic Interventions:  Pt was seen for skilled ST targeting cognitive goals.  Upon arrival, pt was reclined in bed, awake,alert, and agreeable to participate in [place].  Pt repositioned to sitting as upright as possible in bed to maximize attention and alertness during cognitive tasks.  SLP facilitated the session with a structured new learning activity targeting visual scanning to the left.   Pt located similar items between two picture cards with mod faded to min assist verbal and visual cues to scan in an organized manner by looking at one item at a time.  SLP also facilitated the session with a word search activity targeting additional practice using left attention strategies.  Pt located 2 items from the word search over a period of ~10-15 minutes with mod assist verbal and visual cues to utilize a left marginal anchor to redirect  pt's visual attention to the left of the Krystine Pabst.  Continue per current plan of care.     FIM:  Comprehension Comprehension Mode: Auditory Comprehension: 4-Understands basic 75 - 89% of the time/requires cueing 10 - 24% of the time Expression Expression Mode: Verbal Expression: 5-Expresses basic needs/ideas: With no assist Social Interaction Social Interaction: 5-Interacts appropriately 90% of the time - Needs monitoring or encouragement for participation or interaction. Problem Solving Problem Solving: 3-Solves basic 50 - 74% of the time/requires cueing 25 - 49% of the time Memory Memory: 3-Recognizes or recalls 50 - 74% of the time/requires cueing 25 - 49% of the time  Pain Pain Assessment Pain Assessment: No/denies pain  Therapy/Group: Individual Therapy  Harvy Riera, Selinda Orion 09/11/2014, 5:34 PM

## 2014-09-11 NOTE — Patient Care Conference (Signed)
Inpatient RehabilitationTeam Conference and Plan of Care Update Date: 09/11/2014   Time: 11;15 AM    Patient Name: Kayla [name]      Medical Record Number: [id number]  Date of Birth: 05/03/1941 Sex: Female         Room/Bed: 4M01C/4M01C-01 Payor Info: Payor: [employer] MEDICARE / Plan: [employer] MEDICARE HMO/PPO / Product Type: *No Product type* /    Admitting Diagnosis: CVA  Admit Date/Time:  09/05/2014  4:09 PM Admission Comments: No comment available   Primary Diagnosis:  <principal problem not specified> Principal Problem: <principal problem not specified>  Patient Active Problem List   Diagnosis Date Noted  . Left homonymous hemianopsia 09/06/2014  . Occipital infarction 09/05/2014  . AKI (acute kidney injury)   . Confusion   . Altered mental status 08/30/2014  . Acute ischemic right MCA stroke 08/30/2014  . Acute right PCA stroke 08/30/2014  . Abnormal chest x-ray 08/22/2014  . Type 2 diabetes mellitus with diabetic neuropathy 01/04/2014  . HYPERCHOLESTEROLEMIA, MILD 05/22/2009  . PERS HX NONCOMPLIANCE W/MED TX PRS HAZARDS HLTH 07/11/2007  . Essential hypertension 05/04/2007  . Osteoarthritis 05/04/2007  . Fibromyalgia 05/04/2007    Expected Discharge Date: Expected Discharge Date: 09/17/14  Team Members Present: Physician leading conference: Dr. Alysia Penna Social Worker Present: Ovidio Kin, LCSW Nurse Present: Heather Roberts, RN PT Present: Georjean Mode, PT;Blair Hobble, PT OT Present: Simonne Come, Dorothyann Gibbs, OT SLP Present: Windell Moulding, SLP PPS Coordinator present : Daiva Nakayama, RN, CRRN     Current Status/Progress Goal Weekly Team Focus  Medical   severe left neglect and field cut  home with 24/7 care  work on Left side awareness   Bowel/Bladder   Continent of bowel and bladder  remain continent of bowel and bladder  educate paient on signs and symptoms of constipation   Swallow/Nutrition/ Hydration             ADL's   min assist overall, requires  cues for Lt attention during self-care tasks and mobility  supervision overall secondary to Lt visual deficits  LUE NMR, functional mobility, transfers, compensation for visual field cut, family education   Mobility   Supervision bed mobility, Min A transfers, ambulation, and dynamic standing balance  Supervision overall with exceptinon of Min A car transfers, gait in community environment, and   Compensatory strategies for L visual field cut; cognitive remediation; safety awareness, L NMR, gait/stair training   Communication             Safety/Cognition/ Behavioral Observations  Mod assist   Supervision   increase intellectual awareness of deficits, left attention, recall, self-monitoring and ocrrecting duirng functional tasks   Pain   patient reports no pain  pain less than or equal to 4 on a scale of 0-10  assess pain q4h and prn, medicate if indicated   Skin   No skin problwems noted, surgical wound for loop recorder clean and dry  no further skin injury/breakdown  assess skin q shift and prn      *See Care Plan and progress notes for long and short-term goals.  Barriers to Discharge: will need 24/7 care, plateau at sup, husband with limited mobility    Possible Resolutions to Barriers:  family training    Discharge Planning/Teaching Needs:  Home with husband and daughter to assist-both here daily and observing in therapies      Team Discussion:  Does well physically but has L-field cut and doesn't recall visual problem. Overall supervision level goals-question  if husband can provide the care she requires and how much care daughter can assist with.  Cognition deficits  Revisions to Treatment Plan:  None   Continued Need for Acute Rehabilitation Level of Care: The patient requires daily medical management by a physician with specialized training in physical medicine and rehabilitation for the following conditions: Daily direction of a multidisciplinary physical rehabilitation  program to ensure safe treatment while eliciting the highest outcome that is of practical value to the patient.: Yes Daily medical management of patient stability for increased activity during participation in an intensive rehabilitation regime.: Yes Daily analysis of laboratory values and/or radiology reports with any subsequent need for medication adjustment of medical intervention for : Neurological problems;Other  Reinhardt Licausi, Gardiner Rhyme 09/12/2014, 8:59 AM

## 2014-09-11 NOTE — Progress Notes (Signed)
74 y.o. right handed female with history of hypertension, diabetes mellitus and peripheral neuropathy, sickle cell trait and recent bronchitis. Independently with occasional cane prior to admission living with her husband. Admitted 08/30/2014 with altered mental status and left-sided weakness. MRI of the brain showed acute nonhemorrhagic infarct involving the right occipital lobe and posterior inferior right temporal lobe. MRA of the head showed high-grade tandem stenosis of the distal right M1 segment and associated proximal right M2 segment. Carotid Dopplers with no ICA stenosis. Patient did not receive TPA. TEE 09/02/2014 with normal LV function, negative saline cavitation study. Subjective/Complaints: Pt without issues overnite, aware that she has Left field cut but does not scan adequately to left  Review of Systems - Negative except as above   Objective: Vital Signs: Blood pressure 129/70, pulse 71, temperature 97.9 F (36.6 C), temperature source Oral, resp. rate 17, height 5' 6" (1.676 m), weight 89 kg (196 lb 3.4 oz), SpO2 98 %. No results found. Results for orders placed or performed during the hospital encounter of 09/05/14 (from the past 72 hour(s))  Glucose, capillary     Status: Abnormal   Collection Time: 09/08/14 11:45 AM  Result Value Ref Range   Glucose-Capillary 282 (H) 70 - 99 mg/dL   Comment 1 Notify RN   Glucose, capillary     Status: Abnormal   Collection Time: 09/08/14  4:43 PM  Result Value Ref Range   Glucose-Capillary 139 (H) 70 - 99 mg/dL  Glucose, capillary     Status: Abnormal   Collection Time: 09/08/14  9:10 PM  Result Value Ref Range   Glucose-Capillary 176 (H) 70 - 99 mg/dL  Glucose, capillary     Status: Abnormal   Collection Time: 09/09/14  7:01 AM  Result Value Ref Range   Glucose-Capillary 145 (H) 70 - 99 mg/dL  Glucose, capillary     Status: Abnormal   Collection Time: 09/09/14  4:51 PM  Result Value Ref Range   Glucose-Capillary 134 (H) 70 -  99 mg/dL  Glucose, capillary     Status: Abnormal   Collection Time: 09/09/14  9:14 PM  Result Value Ref Range   Glucose-Capillary 189 (H) 70 - 99 mg/dL  Glucose, capillary     Status: Abnormal   Collection Time: 09/10/14  6:50 AM  Result Value Ref Range   Glucose-Capillary 150 (H) 70 - 99 mg/dL   Comment 1 Notify RN   Glucose, capillary     Status: Abnormal   Collection Time: 09/10/14 11:31 AM  Result Value Ref Range   Glucose-Capillary 134 (H) 70 - 99 mg/dL  Glucose, capillary     Status: None   Collection Time: 09/10/14  4:35 PM  Result Value Ref Range   Glucose-Capillary 97 70 - 99 mg/dL  Glucose, capillary     Status: Abnormal   Collection Time: 09/10/14  9:12 PM  Result Value Ref Range   Glucose-Capillary 230 (H) 70 - 99 mg/dL   Comment 1 Notify RN   Glucose, capillary     Status: Abnormal   Collection Time: 09/11/14  6:59 AM  Result Value Ref Range   Glucose-Capillary 138 (H) 70 - 99 mg/dL   Comment 1 Notify RN      HEENT: normal Cardio: RRR and no murmur Resp: CTA B/L and unlabored GI: BS positive and NT, ND Extremity:  Pulses positive and No Edema Skin:   Intact Neuro: Alert/Oriented, Normal Sensory, Abnormal Motor 3-/5 L Bi, Tri,Grip, HF, 4/5 KE,  5/5 on  right and Other Left Homonymous hemianopsia Musc/Skel:  Normal and Other no pain with AROM BUE and BLE Gen NAD   Assessment/Plan: 1. Functional deficits secondary to Right temporal occipital lobe infarct, posterior cerebral artery distribution which require 3+ hours per day of interdisciplinary therapy in a comprehensive inpatient rehab setting. Physiatrist is providing close team supervision and 24 hour management of active medical problems listed below. Physiatrist and rehab team continue to assess barriers to discharge/monitor patient progress toward functional and medical goals. Team conference today please see physician documentation under team conference tab, met with team face-to-face to discuss  problems,progress, and goals. Formulized individual treatment plan based on medical history, underlying problem and comorbidities. FIM: FIM - Bathing Bathing Steps Patient Completed: Chest, Right Arm, Left Arm, Abdomen, Front perineal area, Buttocks, Right upper leg, Left upper leg, Right lower leg (including foot), Left lower leg (including foot) Bathing: 4: Steadying assist  FIM - Upper Body Dressing/Undressing Upper body dressing/undressing steps patient completed: Thread/unthread right bra strap, Thread/unthread left bra strap, Hook/unhook bra, Thread/unthread right sleeve of pullover shirt/dresss, Pull shirt over trunk, Put head through opening of pull over shirt/dress, Thread/unthread left sleeve of pullover shirt/dress Upper body dressing/undressing: 5: Supervision: Safety issues/verbal cues FIM - Lower Body Dressing/Undressing Lower body dressing/undressing steps patient completed: Thread/unthread right underwear leg, Thread/unthread left underwear leg, Pull underwear up/down, Thread/unthread right pants leg, Thread/unthread left pants leg, Pull pants up/down, Don/Doff right shoe, Don/Doff left shoe, Fasten/unfasten right shoe, Fasten/unfasten left shoe Lower body dressing/undressing: 5: Supervision: Safety issues/verbal cues  FIM - Toileting Toileting steps completed by patient: Adjust clothing prior to toileting, Performs perineal hygiene, Adjust clothing after toileting Toileting Assistive Devices: Grab bar or rail for support Toileting: 5: Supervision: Safety issues/verbal cues  FIM - Radio producer Devices: Grab bars Toilet Transfers: 4-To toilet/BSC: Min A (steadying Pt. > 75%), 4-From toilet/BSC: Min A (steadying Pt. > 75%)  FIM - Control and instrumentation engineer Devices: Arm rests, Copy: 4: Bed > Chair or W/C: Min A (steadying Pt. > 75%), 4: Chair or W/C > Bed: Min A (steadying Pt. > 75%)  FIM - Locomotion:  Wheelchair Distance: 160 Locomotion: Wheelchair: 2: Travels 50 - 149 ft with minimal assistance (Pt.>75%) FIM - Locomotion: Ambulation Locomotion: Ambulation Assistive Devices: Administrator Ambulation/Gait Assistance: 4: Min assist Locomotion: Ambulation: 4: Travels 150 ft or more with minimal assistance (Pt.>75%)  Comprehension Comprehension Mode: Auditory Comprehension: 4-Understands basic 75 - 89% of the time/requires cueing 10 - 24% of the time  Expression Expression Mode: Verbal Expression: 5-Expresses basic needs/ideas: With no assist  Social Interaction Social Interaction: 5-Interacts appropriately 90% of the time - Needs monitoring or encouragement for participation or interaction.  Problem Solving Problem Solving Mode: Not assessed Problem Solving: 3-Solves basic 50 - 74% of the time/requires cueing 25 - 49% of the time  Memory Memory Mode: Not assessed Memory: 3-Recognizes or recalls 50 - 74% of the time/requires cueing 25 - 49% of the time  Medical Problem List and Plan: 1. Functional deficits secondary to right brain infarct with left hemiparesis. Status post loop recorder 09/03/2014 2. DVT Prophylaxis/Anticoagulation: Subcutaneous Lovenox. Monitor platelet counts and any signs of bleeding. 3. Pain Management: Tylenol as needed 4. Diabetes mellitus with peripheral neuropathy. Hemoglobin A1c 7.2. Check blood sugars before meals and at bedtime. Presently on sliding scale insulin. Patient on Glucophage 500 mg daily prior to admission. Resume as tolerated, elevated creat will hold off for now, consider amaryl 5.  Neuropsych: This patient is capable of making decisions on her own behalf. 6. Skin/Wound Care: Routine skin checks 7. Fluids/Electrolytes/Nutrition: Strict I and O's with follow-up chemistries 8. Hypertension. Presently on Tenormin 50 mg daily, hydrochlorothiazide 25 mg daily, Avapro 300 mg daily. Monitor with increased mobility. 9. Hyperlipidemia.  Lipitor  LOS (Days) 6 A FACE TO FACE EVALUATION WAS PERFORMED  Kayla [name] E 09/11/2014, 8:33 AM

## 2014-09-12 ENCOUNTER — Inpatient Hospital Stay (HOSPITAL_COMMUNITY): Payer: Medicare HMO | Admitting: Speech Pathology

## 2014-09-12 ENCOUNTER — Inpatient Hospital Stay (HOSPITAL_COMMUNITY): Payer: Medicare HMO | Admitting: Physical Therapy

## 2014-09-12 ENCOUNTER — Inpatient Hospital Stay (HOSPITAL_COMMUNITY): Payer: Medicare HMO | Admitting: Occupational Therapy

## 2014-09-12 LAB — GLUCOSE, CAPILLARY
GLUCOSE-CAPILLARY: 106 mg/dL — AB (ref 70–99)
Glucose-Capillary: 127 mg/dL — ABNORMAL HIGH (ref 70–99)
Glucose-Capillary: 118 mg/dL — ABNORMAL HIGH (ref 70–99)
Glucose-Capillary: 155 mg/dL — ABNORMAL HIGH (ref 70–99)

## 2014-09-12 NOTE — Progress Notes (Signed)
Social Work Patient ID: Kayla [name], female   DOB: Jun 04, 1941, 74 y.o.   MRN: [id number] Spoke with [name] to ask if they would provide coverage for pt to go to a NH from [facility].  She reported due to pt's goal level of supervision with cueing they would not. She had to ask medical director to approve pt to stay until 3/29 on rehab.  Contacted [name] to inform of the insurance decision.  She will talk with other family members and Come up with a plan for Mom.  Discussed will get home health follow up and any equipment she needs.  Daughter and son were here yesterday to participate in therapies with pt.

## 2014-09-12 NOTE — Progress Notes (Signed)
Physical Therapy Session Note  Patient Details  Name: Kayla [name] MRN: [id number] Date of Birth: [birth year]  Today's Date: 09/12/2014 PT Individual Time: 1500-1600 PT Individual Time Calculation (min): 60 min   Short Term Goals: Week 1:  PT Short Term Goal 1 (Week 1): Pt will transfer from bed <> w/c with supervision and 25% cueing for attention to L visual field/L side of body. PT Short Term Goal 2 (Week 1): Pt will ambulate x150' in controlled environment using LRAD with supervision and 25% cueing for visual scanning to L of midline. PT Short Term Goal 3 (Week 1): Pt will negotiate 3 stairs with 2 rails requiring supervision and 25% cueing for sequencing, attention to [place]. PT Short Term Goal 4 (Week 1): Pt will ambulate x50' in home environment with LRAD and 25% cueing for visual scanning and obstacle negotiation.  Skilled Therapeutic Interventions/Progress Updates:  Pt received lying supine with family present in room. Session focused on family education with spouse, including guarding techniques, bed mobility, car transfers and stair negotiation.  Pt functionally ambulated >181ft with min assist provided by both SPT and husband. Verbal cues and education provided for proper guarding technique to husband with attention to her decreased sense of L visual field.  Pt performed transfer to/from car, first with SPT demonstrating proper guarding technique, then with the husband, who verbally/demonstrated understanding.  Husband will require reinforcement with the stairs.  Pt negotiated 4 stairs x2. The first with SPT, who demonstrated proper guarding technique.  The second trial was with her husband, who verbally/demonstrated understanding.  Husband verbally agreed to attend PT session tomorrow for further hands on training.  Pt left in recliner in room with quick release belt in place, all needs within reach, and family present.        Therapy Documentation Precautions:   Precautions Precautions: Fall Precaution Comments: L hemi Restrictions Weight Bearing Restrictions: No Vital Signs: Therapy Vitals Temp: 97.8 F (36.6 C) Temp Source: Oral Pulse Rate: 72 Resp: 20 BP: 103/67 mmHg Patient Position (if appropriate): Lying Oxygen Therapy SpO2: 98 % O2 Device: Not Delivered Pain: Pain Assessment Pain Assessment: No/denies pain Locomotion : Ambulation Ambulation/Gait Assistance: 4: Min assist;4: Min guard   See FIM for current functional status  Therapy/Group: Individual Therapy  Malley Hauter 09/12/2014, 4:26 PM

## 2014-09-12 NOTE — Progress Notes (Signed)
Occupational Therapy Session Note  Patient Details  Name: Kayla [name] MRN: [id number] Date of Birth: 01-17-1941  Today's Date: 09/12/2014 OT Individual Time: 1100-1200 OT Individual Time Calculation (min): 60 min    Short Term Goals: Week 1:  OT Short Term Goal 1 (Week 1): Pt will scan to Lt to obtain items to complete self-care tasks with verbal cues <10% of time. OT Short Term Goal 2 (Week 1): Pt will complete bathing with supervision at sit > stand level OT Short Term Goal 3 (Week 1): Pt will complete UB dressing with min assist OT Short Term Goal 4 (Week 1): Pt will complete LB dressing with min assist OT Short Term Goal 5 (Week 1): Pt will complete toilet transfer with supervision with LRAD  Skilled Therapeutic Interventions/Progress Updates:   Skilled therapy session focused on ADL retraining with emphasis on attention to Lt environment, and functional use of LUE. Pt required steady min A during ambulation to shower room. Pt completed toilet and shower chair transfer with supervision for safety and cues.Therapist observed good c[place]over from previous education as evidenced by appropriate use of grab bars and safe body position during shower.Therapist facilitated UB and LB dressing in room in w/c by providing set up for retrieval of clothes and supervision for safety. Pt provided with fabric bracelet for left wrist to serve as a visual cue to attend the the left side. Pt began lunch and therapist increased challenge by placing service items in the left field and initiating discussion about remembering to scan entire plate/tray for items needed.  Therapy Documentation Precautions:  Precautions Precautions: Fall Precaution Comments: L hemi Restrictions Weight Bearing Restrictions: No   Pain: Pain Assessment Pain Score: 0-No pain     See FIM for current functional status  Therapy/Group: Individual Therapy  Seaborn Nakama 09/12/2014, 12:16 PM

## 2014-09-12 NOTE — Progress Notes (Signed)
Subjective/Complaints: Switched rooms yesterday, now has area of visual field loss towards door Sleepy this am but awakens to voice  Review of Systems - Negative except as above   Objective: Vital Signs: Blood pressure 139/96, pulse 79, temperature 97.6 F (36.4 C), temperature source Oral, resp. rate 19, height 5\' 6"  (1.676 m), weight 89 kg (196 lb 3.4 oz), SpO2 98 %. No results found. Results for orders placed or performed during the hospital encounter of 09/05/14 (from the past 72 hour(s))  Glucose, capillary     Status: Abnormal   Collection Time: 09/09/14  7:01 AM  Result Value Ref Range   Glucose-Capillary 145 (H) 70 - 99 mg/dL  Glucose, capillary     Status: Abnormal   Collection Time: 09/09/14  4:51 PM  Result Value Ref Range   Glucose-Capillary 134 (H) 70 - 99 mg/dL  Glucose, capillary     Status: Abnormal   Collection Time: 09/09/14  9:14 PM  Result Value Ref Range   Glucose-Capillary 189 (H) 70 - 99 mg/dL  Glucose, capillary     Status: Abnormal   Collection Time: 09/10/14  6:50 AM  Result Value Ref Range   Glucose-Capillary 150 (H) 70 - 99 mg/dL   Comment 1 Notify RN   Glucose, capillary     Status: Abnormal   Collection Time: 09/10/14 11:31 AM  Result Value Ref Range   Glucose-Capillary 134 (H) 70 - 99 mg/dL  Glucose, capillary     Status: None   Collection Time: 09/10/14  4:35 PM  Result Value Ref Range   Glucose-Capillary 97 70 - 99 mg/dL  Glucose, capillary     Status: Abnormal   Collection Time: 09/10/14  9:12 PM  Result Value Ref Range   Glucose-Capillary 230 (H) 70 - 99 mg/dL   Comment 1 Notify RN   Glucose, capillary     Status: Abnormal   Collection Time: 09/11/14  6:59 AM  Result Value Ref Range   Glucose-Capillary 138 (H) 70 - 99 mg/dL   Comment 1 Notify RN   Glucose, capillary     Status: Abnormal   Collection Time: 09/11/14 10:08 AM  Result Value Ref Range   Glucose-Capillary 244 (H) 70 - 99 mg/dL  Glucose, capillary     Status: Abnormal    Collection Time: 09/11/14 11:34 AM  Result Value Ref Range   Glucose-Capillary 160 (H) 70 - 99 mg/dL  Glucose, capillary     Status: Abnormal   Collection Time: 09/11/14  4:45 PM  Result Value Ref Range   Glucose-Capillary 127 (H) 70 - 99 mg/dL  Urinalysis, Routine w reflex microscopic     Status: Abnormal   Collection Time: 09/11/14  8:30 PM  Result Value Ref Range   Color, Urine YELLOW YELLOW   APPearance CLEAR CLEAR   Specific Gravity, Urine 1.018 1.005 - 1.030   pH 6.0 5.0 - 8.0   Glucose, UA 250 (A) NEGATIVE mg/dL   Hgb urine dipstick NEGATIVE NEGATIVE   Bilirubin Urine NEGATIVE NEGATIVE   Ketones, ur NEGATIVE NEGATIVE mg/dL   Protein, ur NEGATIVE NEGATIVE mg/dL   Urobilinogen, UA 1.0 0.0 - 1.0 mg/dL   Nitrite NEGATIVE NEGATIVE   Leukocytes, UA MODERATE (A) NEGATIVE  Urine microscopic-add on     Status: Abnormal   Collection Time: 09/11/14  8:30 PM  Result Value Ref Range   Squamous Epithelial / LPF FEW (A) RARE   WBC, UA 21-50 <3 WBC/hpf   RBC / HPF 0-2 <3 RBC/hpf  Bacteria, UA FEW (A) RARE  Glucose, capillary     Status: Abnormal   Collection Time: 09/11/14  8:44 PM  Result Value Ref Range   Glucose-Capillary 169 (H) 70 - 99 mg/dL  Glucose, capillary     Status: Abnormal   Collection Time: 09/12/14  6:49 AM  Result Value Ref Range   Glucose-Capillary 127 (H) 70 - 99 mg/dL     HEENT: normal Cardio: RRR and no murmur Resp: CTA B/L and unlabored GI: BS positive and NT, ND Extremity:  Pulses positive and No Edema Skin:   Intact Neuro: Alert/Oriented, Normal Sensory, Abnormal Motor 4-/5 L Bi, Tri,Grip, Musc/Skel:  Normal and Other no pain with AROM BUE and BLE Gen NAD   Assessment/Plan: 1. Functional deficits secondary to Right temporal occipital lobe infarct, posterior cerebral artery distribution which require 3+ hours per day of interdisciplinary therapy in a comprehensive inpatient rehab setting. Physiatrist is providing close team supervision and 24 hour  management of active medical problems listed below. Physiatrist and rehab team continue to assess barriers to discharge/monitor patient progress toward functional and medical goals.  FIM: FIM - Bathing Bathing Steps Patient Completed: Chest, Right Arm, Left Arm, Abdomen, Front perineal area, Buttocks, Right upper leg, Left upper leg, Right lower leg (including foot), Left lower leg (including foot) Bathing: 4: Steadying assist  FIM - Upper Body Dressing/Undressing Upper body dressing/undressing steps patient completed: Thread/unthread right bra strap, Thread/unthread left bra strap, Hook/unhook bra, Thread/unthread right sleeve of pullover shirt/dresss, Thread/unthread left sleeve of pullover shirt/dress, Put head through opening of pull over shirt/dress, Pull shirt over trunk Upper body dressing/undressing: 5: Supervision: Safety issues/verbal cues FIM - Lower Body Dressing/Undressing Lower body dressing/undressing steps patient completed: Thread/unthread right underwear leg, Thread/unthread left underwear leg, Pull underwear up/down, Thread/unthread right pants leg, Thread/unthread left pants leg, Pull pants up/down, Don/Doff right sock, Don/Doff left sock (Therapist donned TED hose) Lower body dressing/undressing: 5: Set-up assist to: Don/Doff TED stocking  FIM - Toileting Toileting steps completed by patient: Adjust clothing prior to toileting, Performs perineal hygiene Toileting Assistive Devices: Grab bar or rail for support Toileting: 5: Supervision: Safety issues/verbal cues  FIM - Radio producer Devices: Grab bars Toilet Transfers: 4-To toilet/BSC: Min A (steadying Pt. > 75%), 4-From toilet/BSC: Min A (steadying Pt. > 75%)  FIM - Bed/Chair Transfer Bed/Chair Transfer Assistive Devices: Walker, Arm rests Bed/Chair Transfer: 5: Supine > Sit: Supervision (verbal cues/safety issues), 5: Sit > Supine: Supervision (verbal cues/safety issues), 4: Bed > Chair  or W/C: Min A (steadying Pt. > 75%), 4: Chair or W/C > Bed: Min A (steadying Pt. > 75%)  FIM - Locomotion: Wheelchair Distance: 160 Locomotion: Wheelchair: 0: Activity did not occur FIM - Locomotion: Ambulation Locomotion: Ambulation Assistive Devices: Administrator Ambulation/Gait Assistance: 4: Min guard Locomotion: Ambulation: 4: Travels 150 ft or more with minimal assistance (Pt.>75%)  Comprehension Comprehension Mode: Auditory Comprehension: 4-Understands basic 75 - 89% of the time/requires cueing 10 - 24% of the time  Expression Expression Mode: Verbal Expression: 5-Expresses basic needs/ideas: With no assist  Social Interaction Social Interaction: 5-Interacts appropriately 90% of the time - Needs monitoring or encouragement for participation or interaction.  Problem Solving Problem Solving Mode: Not assessed Problem Solving: 3-Solves basic 50 - 74% of the time/requires cueing 25 - 49% of the time  Memory Memory Mode: Not assessed Memory: 3-Recognizes or recalls 50 - 74% of the time/requires cueing 25 - 49% of the time  Medical Problem List and  Plan: 1. Functional deficits secondary to right brain infarct with left hemiparesis. Status post loop recorder 09/03/2014 2. DVT Prophylaxis/Anticoagulation: Subcutaneous Lovenox. Monitor platelet counts and any signs of bleeding. 3. Pain Management: Tylenol as needed 4. Diabetes mellitus with peripheral neuropathy. Hemoglobin A1c 7.2. Check blood sugars before meals and at bedtime. Presently on sliding scale insulin. Patient on Glucophage 500 mg daily prior to admission. Resume as tolerated, elevated creat will hold off for now, consider amaryl 5. Neuropsych: This patient is capable of making decisions on her own behalf. 6. Skin/Wound Care: Routine skin checks 7. Fluids/Electrolytes/Nutrition: Strict I and O's with follow-up chemistries 8. Hypertension. Presently on Tenormin 50 mg daily, hydrochlorothiazide 25 mg daily, Avapro  300 mg daily. Monitor with increased mobility. 9. Hyperlipidemia. Lipitor  LOS (Days) 7 A FACE TO FACE EVALUATION WAS PERFORMED  KIRSTEINS,ANDREW E 09/12/2014, 6:56 AM

## 2014-09-12 NOTE — Progress Notes (Signed)
Speech Language Pathology Daily Session Note  Patient Details  Name: Kayla [name] MRN: [id number] Date of Birth: [birth year]  Today's Date: 09/12/2014 SLP Individual Time: 1000-1100 SLP Individual Time Calculation (min): 60 min  Short Term Goals: Week 1: SLP Short Term Goal 1 (Week 1): Patient will demonstrate basic problem-solving in 75% of observable opportunities given Mod A verbal, visual, and tactile cues. SLP Short Term Goal 2 (Week 1): Patient will utilize external aids to assist in recall of new, daily information over 3 consecutive sessions given Min A verbal and visual cues. SLP Short Term Goal 3 (Week 1): Patient will demonstrate alternating attention between functional tasks in a quiet environment for 5 minutes given Min A verbal and visual cues. SLP Short Term Goal 4 (Week 1): Patient will self-monitor and correct errors during self-care and home management tasks given Min A question cues. SLP Short Term Goal 5 (Week 1): Patient will attend to left of environment in 75% of observable opportunities given Min A verbal, visual, and tactile cues.  Skilled Therapeutic Interventions:  Pt was seen for skilled ST targeting cognitive goals.  Upon arrival, pt was asleep in bed but was easily awakened to voice and agreeable to participate in [place].  Pt needed min verbal and visual cues for recall and use of safety precautions (i.e. Apply brakes, wait for ST assistance) prior to transfer due to slight impulsivity.  Pt propelled herself in the wheelchair for approximately 5 minutes on the way to the ST treatment room with mod verbal and visual cues for awareness of obstacles in the left environment.  SLP facilitated the session with a structured sequencing task targeting basic problem solving and left attention.  Pt put 4 picture cards in sequential order from left to right with max faded to mod assist verbal and visual cues for organization and to recognize and correct errors.  Pt also benefited from  mod assist verbal, visual, and tactile cues to locate cards on the left.  SLP then facilitated the session with a basic money management task.  Pt sorted coins by value, generated amounts of money when named, and made change with min assist verbal cues and increased processing time.  Continue per current plan of care.    FIM:  Comprehension Comprehension Mode: Auditory Comprehension: 4-Understands basic 75 - 89% of the time/requires cueing 10 - 24% of the time Expression Expression Mode: Verbal Expression: 5-Expresses basic 90% of the time/requires cueing < 10% of the time. Social Interaction Social Interaction: 5-Interacts appropriately 90% of the time - Needs monitoring or encouragement for participation or interaction. Problem Solving Problem Solving: 3-Solves basic 50 - 74% of the time/requires cueing 25 - 49% of the time Memory Memory: 3-Recognizes or recalls 50 - 74% of the time/requires cueing 25 - 49% of the time FIM - Eating Eating Activity: 5: Supervision/cues  Pain Pain Assessment Pain Assessment: No/denies pain  Therapy/Group: Individual Therapy  Tanith Dagostino, Selinda Orion 09/12/2014, 3:50 PM

## 2014-09-12 NOTE — Progress Notes (Signed)
Occupational Therapy Weekly Progress Note  Patient Details  Name: Kayla [name] MRN: [id number] Date of Birth: [birth year]  Beginning of progress report period: September 06, 2014 End of progress report period: September 12, 2014  Patient has met 4 of 5 short term goals.  Pt is making steady progress towards goals.  Pt is currently a min assist level with functional mobility due to Lt visual field cut.  Pt is progressing to supervision with self-care tasks in a controlled environment, however requires mod multimodal cues for scanning to Lt to obtain items and for safety.  Recommend pt have 24/7 supervision due to visual impairment to increase safety.  Patient continues to demonstrate the following deficits: Lt visual field cut, LUE weakness, decreased standing balance, impaired recall of new information and therefore will continue to benefit from skilled OT intervention to enhance overall performance with BADL, iADL and Reduce care partner burden.  Patient progressing toward long term goals..  Continue plan of care.  OT Short Term Goals Week 1:  OT Short Term Goal 1 (Week 1): Pt will scan to Lt to obtain items to complete self-care tasks with verbal cues <10% of time. OT Short Term Goal 1 - Progress (Week 1): Progressing toward goal OT Short Term Goal 2 (Week 1): Pt will complete bathing with supervision at sit > stand level OT Short Term Goal 2 - Progress (Week 1): Met OT Short Term Goal 3 (Week 1): Pt will complete UB dressing with min assist OT Short Term Goal 3 - Progress (Week 1): Met OT Short Term Goal 4 (Week 1): Pt will complete LB dressing with min assist OT Short Term Goal 4 - Progress (Week 1): Met OT Short Term Goal 5 (Week 1): Pt will complete toilet transfer with supervision with LRAD OT Short Term Goal 5 - Progress (Week 1): Met Week 2:  OT Short Term Goal 1 (Week 2): STG = LTGs due to remaining LOS  Skilled Therapeutic Interventions/Progress Updates:      Therapy  Documentation Precautions:  Precautions Precautions: Fall Precaution Comments: L hemi Restrictions Weight Bearing Restrictions: No General:   Vital Signs: Therapy Vitals Temp: 97.8 F (36.6 C) Temp Source: Oral Pulse Rate: 72 Resp: 20 BP: 103/67 mmHg Patient Position (if appropriate): Lying Oxygen Therapy SpO2: 98 % O2 Device: Not Delivered Pain: Pain Assessment Pain Assessment: No/denies pain  See FIM for current functional status   Jamielynn Wigley, [facility] 09/12/2014, 3:34 PM

## 2014-09-13 ENCOUNTER — Inpatient Hospital Stay (HOSPITAL_COMMUNITY): Payer: Medicare HMO | Admitting: Physical Therapy

## 2014-09-13 ENCOUNTER — Inpatient Hospital Stay (HOSPITAL_COMMUNITY): Payer: Medicare HMO | Admitting: Speech Pathology

## 2014-09-13 ENCOUNTER — Inpatient Hospital Stay (HOSPITAL_COMMUNITY): Payer: Medicare HMO

## 2014-09-13 LAB — GLUCOSE, CAPILLARY
GLUCOSE-CAPILLARY: 92 mg/dL (ref 70–99)
GLUCOSE-CAPILLARY: 166 mg/dL — AB (ref 70–99)
GLUCOSE-CAPILLARY: 149 mg/dL — AB (ref 70–99)
Glucose-Capillary: 137 mg/dL — ABNORMAL HIGH (ref 70–99)

## 2014-09-13 LAB — URINE CULTURE: Colony Count: 15000

## 2014-09-13 NOTE — Progress Notes (Signed)
Speech Language Pathology Weekly Progress and Session Note  Patient Details  Name: Kayla [name] MRN: [id number] Date of Birth: Aug 14, 1940  Beginning of progress report period: September 06, 2014 End of progress report period: September 13, 2014   Short Term Goals: Week 1: SLP Short Term Goal 1 (Week 1): Patient will demonstrate basic problem-solving in 75% of observable opportunities given Mod A verbal, visual, and tactile cues. SLP Short Term Goal 1 - Progress (Week 1): Met SLP Short Term Goal 2 (Week 1): Patient will utilize external aids to assist in recall of new, daily information over 3 consecutive sessions given Min A verbal and visual cues. SLP Short Term Goal 2 - Progress (Week 1): Progressing toward goal SLP Short Term Goal 3 (Week 1): Patient will demonstrate alternating attention between functional tasks in a quiet environment for 5 minutes given Min A verbal and visual cues. SLP Short Term Goal 3 - Progress (Week 1): Met SLP Short Term Goal 4 (Week 1): Patient will self-monitor and correct errors during self-care and home management tasks given Min A question cues. SLP Short Term Goal 4 - Progress (Week 1): Progressing toward goal SLP Short Term Goal 5 (Week 1): Patient will attend to left of environment in 75% of observable opportunities given Min A verbal, visual, and tactile cues. SLP Short Term Goal 5 - Progress (Week 1): Met    New Short Term Goals: Week 2: SLP Short Term Goal 1 (Week 2): Patient will demonstrate basic problem-solving in 75% of observable opportunities given Min A verbal and visual cues. SLP Short Term Goal 2 (Week 2): Patient will utilize external aids to assist in recall of new, daily information over 3 consecutive sessions given Min A verbal and visual cues. SLP Short Term Goal 3 (Week 2): Patient will demonstrate alternating attention between functional tasks in a quiet environment for 5 minutes given Min A verbal cues. SLP Short Term Goal 4 (Week 2):  Patient will self-monitor and correct errors during self-care and home management tasks given Min A question cues. SLP Short Term Goal 5 (Week 2): Patient will attend to left of environment in 75% of observable opportunities given Min A verbal and visual cues.  Weekly Progress Updates:  Pt made functional gains this reporting period and has met 3 out of 5 short term goals.  Pt currently requires min-mod assist for basic cognitive tasks due to decreased alternating attention, decreased visual scanning to the left of midline, and decreased recall of new information.  Pt and family education is ongoing.  Pt would continue to benefit from skilled ST while inpatient in order to maximize functional independence and reduce burden of care prior to discharge.  Continue to recommend 24/7 supervision, assistance for medication and financial management, and ST follow up at discharge in either the home health or outpatient setting.     Intensity: Minumum of 1-2 x/day, 30 to 90 minutes Frequency: 3 to 5 out of 7 days Duration/Length of Stay: 10-14 days  Treatment/Interventions: Cueing hierarchy;Cognitive remediation/compensation;Environmental controls;Functional tasks;Therapeutic Activities;Internal/external aids;Patient/family education   Johny Pitstick, [facility] 09/13/2014, 3:13 PM

## 2014-09-13 NOTE — Progress Notes (Signed)
Occupational Therapy Session Note  Patient Details  Name: Kayla [name] MRN: [id number] Date of Birth: [birth year]  Today's Date: 09/13/2014 OT Individual Time: 1000-1100 OT Individual Time Calculation (min): 60 min    Short Term Goals: Week 1:  OT Short Term Goal 1 (Week 1): Pt will scan to Lt to obtain items to complete self-care tasks with verbal cues <10% of time. OT Short Term Goal 1 - Progress (Week 1): Progressing toward goal OT Short Term Goal 2 (Week 1): Pt will complete bathing with supervision at sit > stand level OT Short Term Goal 2 - Progress (Week 1): Met OT Short Term Goal 3 (Week 1): Pt will complete UB dressing with min assist OT Short Term Goal 3 - Progress (Week 1): Met OT Short Term Goal 4 (Week 1): Pt will complete LB dressing with min assist OT Short Term Goal 4 - Progress (Week 1): Met OT Short Term Goal 5 (Week 1): Pt will complete toilet transfer with supervision with LRAD OT Short Term Goal 5 - Progress (Week 1): Met  Skilled Therapeutic Interventions/Progress Updates:   Pt received in room in recliner then participated in skilled therapy session structured around attention to lt environment and functional use of LUE during ADLs. Therapist provided steady min A during ambulation to shower room and supervision for toilet and shower chair transfer. Pt educated on safe use of grab bars in shower with reminder to rinse soap of hands before use to reduce slippery grip and possible fall. UB and LB dressing completed with supervision EOB with items placed in Lt field to increase attention. Oral care completed standing at sink with supervision. Therapist increased difficulty of task by placing all self-care items to the left. Pt. Responded well to min cues to locate items. EOB with therapist seated to left, Pt challenged to locate and read aloud greeting cards. Min verbal cues required to attend to visual cue of therapist's thumb in left margin of cards and read all words in  a line of text Lt to Rt. Son and grandson educated on condition of left neglect and agreed to direct interaction towards pt Lt side whenever possible.  Therapy Documentation Precautions:  Precautions Precautions: Fall Precaution Comments: L hemi Restrictions Weight Bearing Restrictions: No    Pain: Pain Assessment Pain Assessment: No/denies pain Pain Score: 0-No pain  See FIM for current functional status  Therapy/Group: Individual Therapy  Tamey Wanek 09/13/2014, 11:53 AM

## 2014-09-13 NOTE — Progress Notes (Signed)
Physical Therapy Session Note  Patient Details  Name: Kayla [name] MRN: [id number] Date of Birth: [birth year]  Today's Date: 09/13/2014 PT Individual Time: 0830-0930 PT Individual Time Calculation (min): 60 min   Short Term Goals: Week 1:  PT Short Term Goal 1 (Week 1): Pt will transfer from bed <> w/c with supervision and 25% cueing for attention to L visual field/L side of body. PT Short Term Goal 2 (Week 1): Pt will ambulate x150' in controlled environment using LRAD with supervision and 25% cueing for visual scanning to L of midline. PT Short Term Goal 3 (Week 1): Pt will negotiate 3 stairs with 2 rails requiring supervision and 25% cueing for sequencing, attention to [place]. PT Short Term Goal 4 (Week 1): Pt will ambulate x50' in home environment with LRAD and 25% cueing for visual scanning and obstacle negotiation.  Skilled Therapeutic Interventions/Progress Updates:  Pt received in recliner; agreeable to therapy.  Session focused on family education with husband, daughter, and granddaughter (all of whom will be providing care after D/C).  Pt ambulated >165ft with rolling walker and min guard/min A with mod cues for attention to L visual field.  [name] educated on proper guarding techniques with rolling walker for ambulation in controlled and home environments. L HHA suggested for ambulating into/out of bathroom  since rolling walker may not fit between sink and toilet.  Family verbalized and demonstrated understand of L HHA technique.  Family educated on bed mobility, furniture transfers (min A), stairs (4 stairs x3), and car transfers with RW and min A/min guard. Cues provides for family on stairs to stagger feet and for proper guarding techniques.  Daughter requested additional training/practice for increased comfort level.  Family educated on 24/7 supervision; husband requiring additional reinforcement.  Pt left in recliner with quick release belt donned, all needs within reach, and  family present.      Therapy Documentation Precautions:  Precautions Precautions: Fall Precaution Comments: L hemi Restrictions Weight Bearing Restrictions: No Pain: Pain Assessment Pain Assessment: No/denies pain Locomotion : Ambulation Ambulation/Gait Assistance: 4: Min guard;4: Min assist   See FIM for current functional status  Therapy/Group: Individual Therapy  Maris Bena 09/13/2014, 9:36 AM

## 2014-09-13 NOTE — Progress Notes (Addendum)
Speech Language Pathology Daily Session Note  Patient Details  Name: Kayla [name] MRN: [id number] Date of Birth: 04-Sep-1940  Today's Date: 09/13/2014 SLP Individual Time: 1336-1436 SLP Individual Time Calculation (min): 60 min  Short Term Goals: Week 2: SLP Short Term Goal 1 (Week 2): Patient will demonstrate basic problem-solving in 75% of observable opportunities given Min A verbal and visual cues. SLP Short Term Goal 2 (Week 2): Patient will utilize external aids to assist in recall of new, daily information over 3 consecutive sessions given Min A verbal and visual cues. SLP Short Term Goal 3 (Week 2): Patient will demonstrate alternating attention between functional tasks in a quiet environment for 5 minutes given Min A verbal cues. SLP Short Term Goal 4 (Week 2): Patient will self-monitor and correct errors during self-care and home management tasks given Min A question cues. SLP Short Term Goal 5 (Week 2): Patient will attend to left of environment in 75% of observable opportunities given Min A verbal and visual cues.  Skilled Therapeutic Interventions: Skilled treatment session focused on cognition goals.  SLP facilitated session by providing min-mod assistance with attending to the left side during reading tasks and being alert to her surroundings when propelling patient in wheelchair to and from her room.  Patient required mod assistance with memory task recalling what she read 10-15 minutes after the reading task occurred.  Patient required mod assistance with basic problem solving tasks regarding functional tasks that would occur in home setting.  Patient needed min assistance to sustain attention during tasks; therapist provide verbal prompts to get her back into task.  Continue with plan of care.   FIM:  Comprehension Comprehension: 5-Follows basic conversation/direction: With extra time/assistive device Expression Expression Mode: Verbal Expression: 5-Expresses complex 90% of  the time/cues < 10% of the time Social Interaction Social Interaction: 5-Interacts appropriately 90% of the time - Needs monitoring or encouragement for participation or interaction. Problem Solving Problem Solving: 3-Solves basic 50 - 74% of the time/requires cueing 25 - 49% of the time Memory Memory: 3-Recognizes or recalls 50 - 74% of the time/requires cueing 25 - 49% of the time  Pain Pain Assessment Pain Assessment: No/denies pain  Therapy/Group: Individual Therapy  Rushie Goltz 09/13/2014, 5:33 PM

## 2014-09-13 NOTE — Progress Notes (Signed)
Subjective/Complaints: Pt without new c/os Sleepy this am but awakens to voice  Review of Systems - Negative except as above   Objective: Vital Signs: Blood pressure 124/75, pulse 74, temperature 97.1 F (36.2 C), temperature source Oral, resp. rate 19, height 5\' 6"  (1.676 m), weight 89 kg (196 lb 3.4 oz), SpO2 100 %. No results found. Results for orders placed or performed during the hospital encounter of 09/05/14 (from the past 72 hour(s))  Glucose, capillary     Status: Abnormal   Collection Time: 09/10/14  6:50 AM  Result Value Ref Range   Glucose-Capillary 150 (H) 70 - 99 mg/dL   Comment 1 Notify RN   Glucose, capillary     Status: Abnormal   Collection Time: 09/10/14 11:31 AM  Result Value Ref Range   Glucose-Capillary 134 (H) 70 - 99 mg/dL  Glucose, capillary     Status: None   Collection Time: 09/10/14  4:35 PM  Result Value Ref Range   Glucose-Capillary 97 70 - 99 mg/dL  Glucose, capillary     Status: Abnormal   Collection Time: 09/10/14  9:12 PM  Result Value Ref Range   Glucose-Capillary 230 (H) 70 - 99 mg/dL   Comment 1 Notify RN   Glucose, capillary     Status: Abnormal   Collection Time: 09/11/14  6:59 AM  Result Value Ref Range   Glucose-Capillary 138 (H) 70 - 99 mg/dL   Comment 1 Notify RN   Glucose, capillary     Status: Abnormal   Collection Time: 09/11/14 10:08 AM  Result Value Ref Range   Glucose-Capillary 244 (H) 70 - 99 mg/dL  Glucose, capillary     Status: Abnormal   Collection Time: 09/11/14 11:34 AM  Result Value Ref Range   Glucose-Capillary 160 (H) 70 - 99 mg/dL  Glucose, capillary     Status: Abnormal   Collection Time: 09/11/14  4:45 PM  Result Value Ref Range   Glucose-Capillary 127 (H) 70 - 99 mg/dL  Urinalysis, Routine w reflex microscopic     Status: Abnormal   Collection Time: 09/11/14  8:30 PM  Result Value Ref Range   Color, Urine YELLOW YELLOW   APPearance CLEAR CLEAR   Specific Gravity, Urine 1.018 1.005 - 1.030   pH 6.0  5.0 - 8.0   Glucose, UA 250 (A) NEGATIVE mg/dL   Hgb urine dipstick NEGATIVE NEGATIVE   Bilirubin Urine NEGATIVE NEGATIVE   Ketones, ur NEGATIVE NEGATIVE mg/dL   Protein, ur NEGATIVE NEGATIVE mg/dL   Urobilinogen, UA 1.0 0.0 - 1.0 mg/dL   Nitrite NEGATIVE NEGATIVE   Leukocytes, UA MODERATE (A) NEGATIVE  Culture, Urine     Status: None   Collection Time: 09/11/14  8:30 PM  Result Value Ref Range   Specimen Description URINE, CLEAN CATCH    Special Requests NONE    Colony Count      15,000 COLONIES/ML Performed at [place] [facility] B STREP(S.AGALACTIAE)ISOLATED Note: TESTING AGAINST S. AGALACTIAE NOT ROUTINELY PERFORMED DUE TO PREDICTABILITY OF AMP/PEN/VAN SUSCEPTIBILITY. Performed at [facility]    Report Status 09/13/2014 FINAL   Urine microscopic-add on     Status: Abnormal   Collection Time: 09/11/14  8:30 PM  Result Value Ref Range   Squamous Epithelial / LPF FEW (A) RARE   WBC, UA 21-50 <3 WBC/hpf   RBC / HPF 0-2 <3 RBC/hpf   Bacteria, UA FEW (A) RARE  Glucose, capillary  Status: Abnormal   Collection Time: 09/11/14  8:44 PM  Result Value Ref Range   Glucose-Capillary 169 (H) 70 - 99 mg/dL  Glucose, capillary     Status: Abnormal   Collection Time: 09/12/14  6:49 AM  Result Value Ref Range   Glucose-Capillary 127 (H) 70 - 99 mg/dL  Glucose, capillary     Status: Abnormal   Collection Time: 09/12/14 12:03 PM  Result Value Ref Range   Glucose-Capillary 106 (H) 70 - 99 mg/dL  Glucose, capillary     Status: Abnormal   Collection Time: 09/12/14  4:38 PM  Result Value Ref Range   Glucose-Capillary 118 (H) 70 - 99 mg/dL  Glucose, capillary     Status: Abnormal   Collection Time: 09/12/14  9:06 PM  Result Value Ref Range   Glucose-Capillary 155 (H) 70 - 99 mg/dL  Glucose, capillary     Status: Abnormal   Collection Time: 09/13/14  6:42 AM  Result Value Ref Range   Glucose-Capillary 137 (H) 70 - 99 mg/dL     HEENT:  normal Cardio: RRR and no murmur Resp: CTA B/L and unlabored GI: BS positive and NT, ND Extremity:  Pulses positive and No Edema Skin:   Intact Neuro: Alert/Oriented, Normal Sensory, Abnormal Motor 4-/5 L Bi, Tri,Grip, Musc/Skel:  Normal and Other no pain with AROM BUE and BLE Gen NAD   Assessment/Plan: 1. Functional deficits secondary to Right temporal occipital lobe infarct, posterior cerebral artery distribution which require 3+ hours per day of interdisciplinary therapy in a comprehensive inpatient rehab setting. Physiatrist is providing close team supervision and 24 hour management of active medical problems listed below. Physiatrist and rehab team continue to assess barriers to discharge/monitor patient progress toward functional and medical goals.  FIM: FIM - Bathing Bathing Steps Patient Completed: Chest, Right Arm, Left Arm, Abdomen, Front perineal area, Buttocks, Right upper leg, Left upper leg, Right lower leg (including foot), Left lower leg (including foot) Bathing: 4: Steadying assist  FIM - Upper Body Dressing/Undressing Upper body dressing/undressing steps patient completed: Thread/unthread right bra strap, Thread/unthread left bra strap, Hook/unhook bra, Thread/unthread right sleeve of pullover shirt/dresss, Thread/unthread left sleeve of pullover shirt/dress, Put head through opening of pull over shirt/dress, Pull shirt over trunk Upper body dressing/undressing: 5: Supervision: Safety issues/verbal cues FIM - Lower Body Dressing/Undressing Lower body dressing/undressing steps patient completed: Thread/unthread right underwear leg, Thread/unthread left underwear leg, Pull underwear up/down, Thread/unthread right pants leg, Thread/unthread left pants leg, Pull pants up/down, Don/Doff right sock, Don/Doff left sock Lower body dressing/undressing: 5: Set-up assist to: Don/Doff TED stocking  FIM - Toileting Toileting steps completed by patient: Adjust clothing prior to  toileting, Performs perineal hygiene, Adjust clothing after toileting Toileting Assistive Devices: Grab bar or rail for support Toileting: 5: Supervision: Safety issues/verbal cues  FIM - Radio producer Devices: Grab bars, Insurance account manager Transfers: 5-To toilet/BSC: Supervision (verbal cues/safety issues)  FIM - Control and instrumentation engineer Devices: Walker, Bed rails, HOB elevated Bed/Chair Transfer: 5: Sit > Supine: Supervision (verbal cues/safety issues)  FIM - Locomotion: Wheelchair Distance: 160 Locomotion: Wheelchair: 0: Activity did not occur FIM - Locomotion: Ambulation Locomotion: Ambulation Assistive Devices: Administrator Ambulation/Gait Assistance: 4: Min assist, 4: Min guard Locomotion: Ambulation: 4: Travels 150 ft or more with minimal assistance (Pt.>75%)  Comprehension Comprehension Mode: Auditory Comprehension: 4-Understands basic 75 - 89% of the time/requires cueing 10 - 24% of the time  Expression Expression Mode: Verbal Expression: 5-Expresses basic 90% of  the time/requires cueing < 10% of the time.  Social Interaction Social Interaction: 5-Interacts appropriately 90% of the time - Needs monitoring or encouragement for participation or interaction.  Problem Solving Problem Solving Mode: Not assessed Problem Solving: 3-Solves basic 50 - 74% of the time/requires cueing 25 - 49% of the time  Memory Memory Mode: Not assessed Memory: 3-Recognizes or recalls 50 - 74% of the time/requires cueing 25 - 49% of the time  Medical Problem List and Plan: 1. Functional deficits secondary to right brain infarct with left hemiparesis. Status post loop recorder 09/03/2014 2. DVT Prophylaxis/Anticoagulation: Subcutaneous Lovenox. Monitor platelet counts and any signs of bleeding. 3. Pain Management: Tylenol as needed 4. Diabetes mellitus with peripheral neuropathy. Hemoglobin A1c 7.2. Check blood sugars before meals and at  bedtime. Presently on sliding scale insulin. Patient on Glucophage 500 mg daily prior to admission. Resume as tolerated, elevated creat will hold off for now, consider amaryl 5. Neuropsych: This patient is capable of making decisions on her own behalf. 6. Skin/Wound Care: Routine skin checks 7. Fluids/Electrolytes/Nutrition: Strict I and O's with follow-up chemistries 8. Hypertension. Presently on Tenormin 50 mg daily, hydrochlorothiazide 25 mg daily, Avapro 300 mg daily. Monitor with increased mobility. 9. Hyperlipidemia. Lipitor  LOS (Days) 8 A FACE TO FACE EVALUATION WAS PERFORMED  KIRSTEINS,ANDREW E 09/13/2014, 6:45 AM

## 2014-09-13 NOTE — Discharge Instructions (Signed)
Inpatient Rehab Discharge Instructions  Kayla [name] Discharge date and time: No discharge date for patient encounter.   Activities/Precautions/ Functional Status: Activity: activity as tolerated Diet: diabetic diet Wound Care: none needed Functional status:  ___ No restrictions     ___ Walk up steps independently ___ 24/7 supervision/assistance   ___ Walk up steps with assistance ___ Intermittent supervision/assistance  ___ Bathe/dress independently ___ Walk with walker     ___ Bathe/dress with assistance ___ Walk Independently    ___ Shower independently _x STROKE/TIA DISCHARGE INSTRUCTIONS SMOKING Cigarette smoking nearly doubles your risk of having a stroke & is the single most alterable risk factor  If you smoke or have smoked in the last 12 months, you are advised to quit smoking for your health.  Most of the excess cardiovascular risk related to smoking disappears within a year of stopping.  Ask you doctor about anti-smoking medications  [place] Quit Line: 1-800-QUIT NOW  Free Smoking Cessation Classes [phone]  CHOLESTEROL Know your levels; limit fat & cholesterol in your diet  Lipid Panel     Component Value Date/Time   CHOL 169 08/31/2014 0605   TRIG 124 08/31/2014 0605   HDL 34* 08/31/2014 0605   CHOLHDL 5.0 08/31/2014 0605   VLDL 25 08/31/2014 0605   LDLCALC 110* 08/31/2014 0605      Many patients benefit from treatment even if their cholesterol is at goal.  Goal: Total Cholesterol (CHOL) less than 160  Goal:  Triglycerides (TRIG) less than 150  Goal:  HDL greater than 40  Goal:  LDL (LDLCALC) less than 100   BLOOD PRESSURE American Stroke Association blood pressure target is less that 120/80 mm/Hg  Your discharge blood pressure is:  BP: (!) 139/96 mmHg  Monitor your blood pressure  Limit your salt and alcohol intake  Many individuals will require more than one medication for high blood pressure  DIABETES (A1c is a blood sugar average for last 3  months) Goal HGBA1c is under 7% (HBGA1c is blood sugar average for last 3 months)  Diabetes:     Lab Results  Component Value Date   HGBA1C 7.8* 08/31/2014     Your HGBA1c can be lowered with medications, healthy diet, and exercise.  Check your blood sugar as directed by your physician  Call your physician if you experience unexplained or low blood sugars.  PHYSICAL ACTIVITY/REHABILITATION Goal is 30 minutes at least 4 days per week  Activity: Increase activity slowly, Therapies: Physical Therapy: Home Health Return to work:   Activity decreases your risk of heart attack and stroke and makes your heart stronger.  It helps control your weight and blood pressure; helps you relax and can improve your mood.  Participate in a regular exercise program.  Talk with your doctor about the best form of exercise for you (dancing, walking, swimming, cycling).  DIET/WEIGHT Goal is to maintain a healthy weight  Your discharge diet is: Diet heart healthy/carb modified  liquids Your height is:  Height: 5\' 6"  (167.6 cm) Your current weight is: Weight: 89 kg (196 lb 3.4 oz) Your Body Mass Index (BMI) is:  BMI (Calculated): 31.7  Following the type of diet specifically designed for you will help prevent another stroke.  Your goal weight range is:    Your goal Body Mass Index (BMI) is 19-24.  Healthy food habits can help reduce 3 risk factors for stroke:  High cholesterol, hypertension, and excess weight.  RESOURCES Stroke/Support Group:  Call [phone]   STROKE  EDUCATION PROVIDED/REVIEWED AND GIVEN TO PATIENT Stroke warning signs and symptoms How to activate emergency medical system (call 911). Medications prescribed at discharge. Need for follow-up after discharge. Personal risk factors for stroke. Pneumonia vaccine given:  Flu vaccine given:  My questions have been answered, the writing is legible, and I understand these instructions.  I will adhere to these goals & educational materials  that have been provided to me after my discharge from the hospital.    __ Walk with assistance    ___ Shower with assistance ___ No alcohol     ___ Return to work/school ________  Special Instructions:    COMMUNITY REFERRALS UPON DISCHARGE:    Home Health:   PT, OT, SP, [place] CBULA:[phone] Date of last service:09/17/2014  Medical Equipment/Items Ordered: [place] RESOURCES FOR PATIENT/FAMILY: Support Groups:[facility] SUPPORT GROUP  My questions have been answered and I understand these instructions. I will adhere to these goals and the provided educational materials after my discharge from the hospital.  Patient/Caregiver Signature _______________________________ Date __________  Clinician Signature _______________________________________ Date __________  Please bring this form and your medication list with you to all your follow-up doctor's appointments.

## 2014-09-13 NOTE — Progress Notes (Signed)
Physical Therapy Weekly Progress Note  Patient Details  Name: Kayla [name] MRN: [id number] Date of Birth: [birth year]  Beginning of progress report period: September 06, 2014 End of progress report period: September 14, 2014  Today's Date: 09/14/2014 PT Individual Time: 8:00-9:00 (60 minutes)   Patient has met 0 of 4 short term goals.  Pt continues to require at least 50% cueing to attend to L visual field during ambulation in controlled and home environments, but has increased endurance and functional ambulation distance to 150'.  Overall, pt is currently functioning at a min A/min guard level.   Patient continues to demonstrate the following deficits: L visual field cut, abnormality of gait, hemiplegia non-dominant, impaired cognition, impaired sensation and muscle weakness, and therefore will continue to benefit from skilled PT intervention to enhance overall performance with activity tolerance, balance, ability to compensate for deficits, attention, awareness and coordination.  Patient is progressing towards some of her long term goals, but ambulation and stair goals have been downgraded due >25% verbal cues needs for attention to L visual field. Continue plan of care.  PT Short Term Goals Week 1:  PT Short Term Goal 1 (Week 1): Pt will transfer from bed <> w/c with supervision and 25% cueing for attention to L visual field/L side of body. Progressing towards goals.  PT Short Term Goal 2 (Week 1): Pt will ambulate x150' in controlled environment using LRAD with supervision and 25% cueing for visual scanning to L of midline. Progressing towards goal. PT Short Term Goal 3 (Week 1): Pt will negotiate 3 stairs with 2 rails requiring supervision and 25% cueing for sequencing, attention to [place]. Progressing towards goals.  PT Short Term Goal 4 (Week 1): Pt will ambulate x50' in home environment with LRAD and 25% cueing for visual scanning and obstacle negotiation. Progressing towards goal. Week 2:  STGs = LTGs due to LOS.  Therapy Documentation Precautions:  Precautions Precautions: Fall Precaution Comments: L hemi Restrictions Weight Bearing Restrictions: No Vital Signs: Therapy Vitals Temp: 99 F (37.2 C) Temp Source: Oral Pulse Rate: 70 Resp: 18 BP: (!) 142/66 mmHg Patient Position (if appropriate): Lying Oxygen Therapy SpO2: 100 % O2 Device: Not Delivered  See FIM for current functional status  Therapy/Group: Individual Therapy  Ferman Basilio 09/13/2014, 4:49 PM

## 2014-09-14 ENCOUNTER — Inpatient Hospital Stay (HOSPITAL_COMMUNITY): Payer: Medicare HMO | Admitting: Physical Therapy

## 2014-09-14 ENCOUNTER — Inpatient Hospital Stay (HOSPITAL_COMMUNITY): Payer: Medicare HMO

## 2014-09-14 DIAGNOSIS — K5900 Constipation, unspecified: Secondary | ICD-10-CM

## 2014-09-14 LAB — GLUCOSE, CAPILLARY
GLUCOSE-CAPILLARY: 193 mg/dL — AB (ref 70–99)
GLUCOSE-CAPILLARY: 95 mg/dL (ref 70–99)
Glucose-Capillary: 141 mg/dL — ABNORMAL HIGH (ref 70–99)
Glucose-Capillary: 171 mg/dL — ABNORMAL HIGH (ref 70–99)

## 2014-09-14 NOTE — Plan of Care (Signed)
Problem: RH Ambulation Goal: LTG Patient will ambulate in controlled environment (PT) LTG: Patient will ambulate in a controlled environment, # of feet with assistance (PT).  Goal downgraded due to lack of progress with attention to L visual field.  Goal: LTG Patient will ambulate in home environment (PT) LTG: Patient will ambulate in home environment, # of feet with assistance (PT).  Goal downgraded due to lack of progress with attention to L visual field.      Problem: RH Stairs Goal: LTG Patient will ambulate up and down stairs w/assist (PT) LTG: Patient will ambulate up and down # of stairs with assistance (PT)  Goal downgraded due to lack of progress with attention to L visual field.

## 2014-09-14 NOTE — Progress Notes (Signed)
Physical Therapy Session Note  Patient Details  Name: Kayla [name] MRN: [id number] Date of Birth: 21-Jul-1940  Today's Date: 09/14/2014 PT Individual Time: 0800-0900 PT Individual Time Calculation (min): 60 min   Short Term Goals: Week 1:  PT Short Term Goal 1 (Week 1): Pt will transfer from bed <> w/c with supervision and 25% cueing for attention to L visual field/L side of body. PT Short Term Goal 2 (Week 1): Pt will ambulate x150' in controlled environment using LRAD with supervision and 25% cueing for visual scanning to L of midline. PT Short Term Goal 3 (Week 1): Pt will negotiate 3 stairs with 2 rails requiring supervision and 25% cueing for sequencing, attention to [place]. PT Short Term Goal 4 (Week 1): Pt will ambulate x50' in home environment with LRAD and 25% cueing for visual scanning and obstacle negotiation.  Skilled Therapeutic Interventions/Progress Updates:  Pt received lying supine asleep; agreeable to therapy after waking.  Treatment focused on floor transfers and functional standing balance. Pt educated on proper floor transfer technique for the purpose of fall recovery, demonstrating quadruped and tall kneeling (for L sided awareness and activation) while transferring from floor<>mat requiring min A, mod verbal cues in first trial and progressing to min verbal cues in second trial.  Berg Balance Scale (BBS) completed; pt improved with score of 42/56, compared to 40/56 at eval.  Pt educated on BBS score and her risk for falls.  SPT reiterated importance of utilizing rolling walker for stability/balance and having husband/family member close by for safety; pt and daughter verbalized understanding.  Pt ambulated 3ft with rolling walker and supervision/min guard to/from room, SPT guarding from room, daughter guarding on L side returning to room.  SPT educated pt/daughter on stroke symptoms; daughter verbalized understanding, stating she had information in pt's folder.  Pt left on  toilet with daughter present.        Therapy Documentation Precautions:  Precautions Precautions: Fall Precaution Comments: L hemi Restrictions Weight Bearing Restrictions: No Pain: Pain Assessment Pain Assessment: No/denies pain Locomotion : Ambulation Ambulation/Gait Assistance: 4: Min guard;5: Supervision  Balance: Balance Balance Assessed: Yes Berg Balance Test Sit to Stand: Able to stand  independently using hands Standing Unsupported: Able to stand safely 2 minutes Sitting with Back Unsupported but Feet Supported on Floor or Stool: Able to sit safely and securely 2 minutes Stand to Sit: Controls descent by using hands Transfers: Able to transfer safely, definite need of hands Standing Unsupported with Eyes Closed: Able to stand 10 seconds safely Standing Ubsupported with Feet Together: Able to place feet together independently but unable to hold for 30 seconds From Standing, Reach Forward with Outstretched Arm: Can reach forward >12 cm safely (5") From Standing Position, Pick up Object from Floor: Able to pick up shoe safely and easily From Standing Position, Turn to Look Behind Over each Shoulder: Looks behind from both sides and weight shifts well Turn 360 Degrees: Able to turn 360 degrees safely but slowly Standing Unsupported, Alternately Place Feet on Step/Stool: Able to complete >2 steps/needs minimal assist Standing Unsupported, One Foot in Front: Able to place foot tandem independently and hold 30 seconds Standing on One Leg: Tries to lift leg/unable to hold 3 seconds but remains standing independently Total Score: 42  See FIM for current functional status  Therapy/Group: Individual Therapy  Eron Goble 09/14/2014, 9:14 AM

## 2014-09-14 NOTE — Progress Notes (Signed)
Occupational Therapy Session Note  Patient Details  Name: Kayla [name] MRN: [id number] Date of Birth: 1941-06-09  Today's Date: 09/14/2014 OT Individual Time: 1000-1100 OT Individual Time Calculation (min): 60 min    Short Term Goals: Week 1:  OT Short Term Goal 1 (Week 1): Pt will scan to Lt to obtain items to complete self-care tasks with verbal cues <10% of time. OT Short Term Goal 1 - Progress (Week 1): Progressing toward goal OT Short Term Goal 2 (Week 1): Pt will complete bathing with supervision at sit > stand level OT Short Term Goal 2 - Progress (Week 1): Met OT Short Term Goal 3 (Week 1): Pt will complete UB dressing with min assist OT Short Term Goal 3 - Progress (Week 1): Met OT Short Term Goal 4 (Week 1): Pt will complete LB dressing with min assist OT Short Term Goal 4 - Progress (Week 1): Met OT Short Term Goal 5 (Week 1): Pt will complete toilet transfer with supervision with LRAD OT Short Term Goal 5 - Progress (Week 1): Met  Skilled Therapeutic Interventions/Progress Updates:    Pt seen for ADL retraining with focus on standing balance, attention to left, safety awareness, and functional mobility. Pt received standing at sink with daughter across room reporting she was helping with bathing and dressing. Educated daughter on safety plan and being checked off to complete toileting. Also provided extensive education in regards to providing CGA-SBA when assisting pt. Completed bathing and dressing at sink with supervision for standing balance and min cues for sequencing and locating items on L side. Practiced tub transfer via stepping over ledge with CGA and min cues. Engaged in functional activity of locating numbers posted in hallway in numerical order with emphasis on visual scanning to left and dynamic standing balance. Pt required min cues and increased time to locate all numbers with supervision for functional mobility. Completed plant watering task in standing with min  cues for locating plants on L side and utilizing LUE to hold water pitcher. At end of session pt returned to room and left sitting in recliner with all needs in reach.   Therapy Documentation Precautions:  Precautions Precautions: Fall Precaution Comments: L hemi Restrictions Weight Bearing Restrictions: No General:   Vital Signs:   Pain: Pain Assessment Pain Assessment: No/denies pain  See FIM for current functional status  Therapy/Group: Individual Therapy  Duayne Cal 09/14/2014, 12:33 PM

## 2014-09-14 NOTE — Progress Notes (Signed)
Kayla [name] is a 74 y.o. female Mar 25, 1941 [id number]  Subjective: C/o constipation: sorbitol used to help. Slept well. Feeling OK.  Objective: Vital signs in last 24 hours: Temp:  [98.3 F (36.8 C)-99 F (37.2 C)] 98.3 F (36.8 C) (03/26 0430) Pulse Rate:  [70-81] 81 (03/26 0430) Resp:  [16-18] 16 (03/26 0430) BP: (137-142)/(66-72) 137/72 mmHg (03/26 0430) SpO2:  [95 %-100 %] 95 % (03/26 0430) Weight change:  Last BM Date: 09/12/14  Intake/Output from previous day: 03/25 0701 - 03/26 0700 In: 720 [P.O.:720] Out: -  Last cbgs: CBG (last 3)   Recent Labs  09/13/14 1633 09/13/14 2057 09/14/14 0647  GLUCAP 166* 149* 141*     Physical Exam General: No apparent distress   HEENT: not dry Lungs: Normal effort. Lungs clear to auscultation, no crackles or wheezes. Cardiovascular: Regular rate and rhythm, no edema Abdomen: S/NT/ND; BS(+) Musculoskeletal:  unchanged Neurological: No new neurological deficits Wounds: N/A    Skin: clear  Aging changes Mental state: Alert, oriented, cooperative    Lab Results: BMET    Component Value Date/Time   NA 139 09/06/2014 0640   K 3.8 09/06/2014 0640   CL 103 09/06/2014 0640   CO2 29 09/06/2014 0640   GLUCOSE 137* 09/06/2014 0640   BUN 21 09/06/2014 0640   CREATININE 1.30* 09/06/2014 0640   CALCIUM 9.2 09/06/2014 0640   GFRNONAA 39* 09/06/2014 0640   GFRAA 46* 09/06/2014 0640   CBC    Component Value Date/Time   WBC 5.4 09/06/2014 0640   RBC 5.13* 09/06/2014 0640   HGB 14.9 09/06/2014 0640   HCT 45.3 09/06/2014 0640   PLT 228 09/06/2014 0640   MCV 88.3 09/06/2014 0640   MCH 29.0 09/06/2014 0640   MCHC 32.9 09/06/2014 0640   RDW 15.8* 09/06/2014 0640   LYMPHSABS 2.1 09/06/2014 0640   MONOABS 0.7 09/06/2014 0640   EOSABS 0.3 09/06/2014 0640   BASOSABS 0.0 09/06/2014 0640    Studies/Results: No results found.  Medications: I have reviewed the patient's current medications.  Assessment/Plan:  1.  Functional deficits secondary to right brain infarct with left hemiparesis. Status post loop recorder 09/03/2014 2. DVT Prophylaxis/Anticoagulation: Subcutaneous Lovenox. Monitor platelet counts and any signs of bleeding. 3. Pain Management: Tylenol as needed 4. Diabetes mellitus with peripheral neuropathy. Hemoglobin A1c 7.2. Check blood sugars before meals and at bedtime. Presently on sliding scale insulin. Patient on Glucophage 500 mg daily prior to admission. Resume as tolerated, elevated creat will hold off for now, consider amaryl 5. Neuropsych: This patient is capable of making decisions on her own behalf. 6. Skin/Wound Care: Routine skin checks 7. Fluids/Electrolytes/Nutrition: Strict I and O's with follow-up chemistries 8. Hypertension. Presently on Tenormin 50 mg daily, hydrochlorothiazide 25 mg daily, Avapro 300 mg daily. Monitor with increased mobility. 9. Hyperlipidemia. Lipitor 10. Constipation - Sorbitol po    Length of stay, days: 9  Walker Kehr , MD 09/14/2014, 9:30 AM

## 2014-09-15 ENCOUNTER — Inpatient Hospital Stay (HOSPITAL_COMMUNITY): Payer: Medicare HMO | Admitting: Occupational Therapy

## 2014-09-15 LAB — GLUCOSE, CAPILLARY
Glucose-Capillary: 159 mg/dL — ABNORMAL HIGH (ref 70–99)
Glucose-Capillary: 139 mg/dL — ABNORMAL HIGH (ref 70–99)
Glucose-Capillary: 144 mg/dL — ABNORMAL HIGH (ref 70–99)
Glucose-Capillary: 192 mg/dL — ABNORMAL HIGH (ref 70–99)

## 2014-09-15 NOTE — Progress Notes (Signed)
Occupational Therapy Session Note  Patient Details  Name: Kayla [name] MRN: [id number] Date of Birth: March 20, 1941  Today's Date: 09/15/2014 OT Individual Time:  -   [id number]  (61 min)      Short Term Goals: Week 1:  OT Short Term Goal 1 (Week 1): Pt will scan to Lt to obtain items to complete self-care tasks with verbal cues <10% of time. OT Short Term Goal 1 - Progress (Week 1): Progressing toward goal OT Short Term Goal 2 (Week 1): Pt will complete bathing with supervision at sit > stand level OT Short Term Goal 2 - Progress (Week 1): Met OT Short Term Goal 3 (Week 1): Pt will complete UB dressing with min assist OT Short Term Goal 3 - Progress (Week 1): Met OT Short Term Goal 4 (Week 1): Pt will complete LB dressing with min assist OT Short Term Goal 4 - Progress (Week 1): Met OT Short Term Goal 5 (Week 1): Pt will complete toilet transfer with supervision with LRAD OT Short Term Goal 5 - Progress (Week 1): Met Week 2:  OT Short Term Goal 1 (Week 2): STG = LTGs due to remaining LOS  Skilled Therapeutic Interventions/Progress Updates:    OT addressed basic Addressed functional transfers, bed mobility, standing balance,left upper extremity therapeutic activities in functional task.   Pt. Positioned EOB and engaged in reaching activity with LUE lateral , across body , to ceiling.  Pt had good ROM and no complaint of pain with LUE.  Educated and performed pt  in 5x sit to stand test.  Pt. completed 5x with sit to stand with 18 inch height chair. Pt. Engaged in functional mobility around gym with minimal assist and increased time with turning.   She returned to wc and brought back to room.   Left pt in room with all needs in reach as well as safety belt.     Therapy Documentation Precautions:  Precautions Precautions: Fall Precaution Comments: L hemi Restrictions Weight Bearing Restrictions: No    Vital Signs: Therapy Vitals Temp: 98 F (36.7 C) Temp Source: Oral Pulse Rate:  68 Resp: 16 BP: 124/76 mmHg Patient Position (if appropriate): Lying Oxygen Therapy SpO2: 100 % O2 Device: Not Delivered Pain:  none   See FIM for current functional status  Therapy/Group: Individual Therapy  Lisa Roca 09/15/2014, 5:29 PM

## 2014-09-15 NOTE — Progress Notes (Signed)
Kayla [name] is a 74 y.o. female [birth year] [id number]  Subjective: F/u constipation: sorbitol helps. Slept well. Feeling OK.  Objective: Vital signs in last 24 hours: Temp:  [97.8 F (36.6 C)-98.1 F (36.7 C)] 97.8 F (36.6 C) (03/27 0541) Pulse Rate:  [70-75] 75 (03/27 0541) Resp:  [16-17] 16 (03/27 0541) BP: (126-128)/(69-74) 126/69 mmHg (03/27 0541) SpO2:  [98 %-99 %] 99 % (03/27 0541) Weight change:  Last BM Date: 09/14/14 (BM after sorbitol given)  Intake/Output from previous day: 03/26 0701 - 03/27 0700 In: 600 [P.O.:600] Out: -  Last cbgs: CBG (last 3)   Recent Labs  09/14/14 1643 09/14/14 2059 09/15/14 0650  GLUCAP 95 171* 159*     Physical Exam General: No apparent distress   HEENT: not dry Lungs: Normal effort. Lungs clear to auscultation, no crackles or wheezes. Cardiovascular: Regular rate and rhythm, no edema Abdomen: S/NT/ND; BS(+) Musculoskeletal:  unchanged Neurological: No new neurological deficits Wounds: dressed   Skin: clear  Aging changes Mental state: Alert, cooperative    Lab Results: BMET    Component Value Date/Time   NA 139 09/06/2014 0640   K 3.8 09/06/2014 0640   CL 103 09/06/2014 0640   CO2 29 09/06/2014 0640   GLUCOSE 137* 09/06/2014 0640   BUN 21 09/06/2014 0640   CREATININE 1.30* 09/06/2014 0640   CALCIUM 9.2 09/06/2014 0640   GFRNONAA 39* 09/06/2014 0640   GFRAA 46* 09/06/2014 0640   CBC    Component Value Date/Time   WBC 5.4 09/06/2014 0640   RBC 5.13* 09/06/2014 0640   HGB 14.9 09/06/2014 0640   HCT 45.3 09/06/2014 0640   PLT 228 09/06/2014 0640   MCV 88.3 09/06/2014 0640   MCH 29.0 09/06/2014 0640   MCHC 32.9 09/06/2014 0640   RDW 15.8* 09/06/2014 0640   LYMPHSABS 2.1 09/06/2014 0640   MONOABS 0.7 09/06/2014 0640   EOSABS 0.3 09/06/2014 0640   BASOSABS 0.0 09/06/2014 0640    Studies/Results: No results found.  Medications: I have reviewed the patient's current  medications.  Assessment/Plan:  1. Functional deficits secondary to right brain infarct with left hemiparesis. Status post loop recorder 09/03/2014 2. DVT Prophylaxis/Anticoagulation: Subcutaneous Lovenox. Monitor platelet counts and any signs of bleeding. 3. Pain Management: Tylenol as needed 4. Diabetes mellitus with peripheral neuropathy. Hemoglobin A1c 7.2. Check blood sugars before meals and at bedtime. Presently on sliding scale insulin. Patient on Glucophage 500 mg daily prior to admission. Resume as tolerated, elevated creat will hold off for now, consider amaryl 5. Neuropsych: This patient is capable of making decisions on her own behalf. 6. Skin/Wound Care: Routine skin checks 7. Fluids/Electrolytes/Nutrition: Strict I and O's with follow-up chemistries 8. Hypertension. Presently on Tenormin 50 mg daily, hydrochlorothiazide 25 mg daily, Avapro 300 mg daily. Monitor with increased mobility. 9. Hyperlipidemia. Lipitor 10. Constipation - Sorbitol po 11. Loop recorder dressing change    Length of stay, days: 10  Walker Kehr , MD 09/15/2014, 8:20 AM

## 2014-09-16 ENCOUNTER — Inpatient Hospital Stay (HOSPITAL_COMMUNITY): Payer: Medicare HMO | Admitting: Physical Therapy

## 2014-09-16 ENCOUNTER — Ambulatory Visit: Payer: Medicare HMO

## 2014-09-16 ENCOUNTER — Inpatient Hospital Stay (HOSPITAL_COMMUNITY): Payer: Medicare HMO | Admitting: Speech Pathology

## 2014-09-16 ENCOUNTER — Inpatient Hospital Stay (HOSPITAL_COMMUNITY): Payer: Medicare HMO | Admitting: Occupational Therapy

## 2014-09-16 LAB — GLUCOSE, CAPILLARY
GLUCOSE-CAPILLARY: 146 mg/dL — AB (ref 70–99)
GLUCOSE-CAPILLARY: 125 mg/dL — AB (ref 70–99)
Glucose-Capillary: 111 mg/dL — ABNORMAL HIGH (ref 70–99)
Glucose-Capillary: 164 mg/dL — ABNORMAL HIGH (ref 70–99)

## 2014-09-16 NOTE — Progress Notes (Signed)
Speech Language Pathology Discharge Summary  Patient Details  Name: Kayla [name] MRN: [id number] Date of Birth: [birth year]   Patient has met 3 of 4 long term goals.  Patient to discharge at overall Supervision level.  Reasons goals not met:  Pt requires min assist to recognize and correct errors in the moment.     Clinical Impression/Discharge Summary:  Pt made functional gains while inpatient and is discharging having met 3 out of 4 long term goals due to improved use of memory compensatory aids, basic functional problem solving, and alternating attention.  Pt is currently overall supervision level assist for basic, functional cognitive tasks; however, she intermittently requires min assist to recognize and correct errors in the moment, particularly when errors occur as a result of decreased attention to the left of the environment.  Pt is discharging home with 24/7 supervision, assistance for medication and financial management, and ST follow up to maximize functional independence and reduce burden of care.  Pt and family education is complete at this time.   Care Partner:  Caregiver Able to Provide Assistance: Yes  Type of Caregiver Assistance: Physical;Cognitive  Recommendation:  Outpatient SLP;24 hour supervision/assistance;Home Health SLP  Rationale for SLP Follow Up: Maximize cognitive function and independence;Reduce caregiver burden   Equipment: none recommended by SLP    Reasons for discharge: Discharged from hospital   Patient/Family Agrees with Progress Made and Goals Achieved: Yes   See FIM for current functional status  Emilio Math 09/16/2014, 1:52 PM

## 2014-09-16 NOTE — Plan of Care (Signed)
Problem: RH Memory Goal: LTG Patient demonstrate ability for day to day recall (PT) LTG: Patient will demonstrate ability for day to day recall/carryover during mobility activities with assist (PT)  Outcome: Not Met (add Reason) Pt continues to require mod A cueing during mobility for attention to L visual field.      

## 2014-09-16 NOTE — Plan of Care (Signed)
Problem: RH Awareness Goal: LTG: Patient will demonstrate intellectual/emergent (SLP) LTG: Patient will demonstrate intellectual/emergent/anticipatory awareness with assist during a cognitive/linguistic activity (SLP)  Outcome: Not Met (add Reason) Pt currently requires min assist to recognize and correct errors in the moment.

## 2014-09-16 NOTE — Progress Notes (Signed)
Occupational Therapy Discharge Summary  Patient Details  Name: Kayla [name] MRN: [id number] Date of Birth: August 06, 1940  Patient has met 59 of 4 long term goals due to improved activity tolerance, improved balance, ability to compensate for deficits, functional use of  LEFT upper extremity and improved awareness.  Patient to discharge at overall Supervision level.  Patient's care partner is independent to provide the necessary physical and cognitive assistance at discharge.  Recommend that patient have 24/7 supervision due to Lt visual field impairment, requiring supervision for safety with mobility and cues to attend to Lt visual field during mobility as well as seated tasks. Recommend that pt have tub transfer bench, however family reports they have shower chair and do not want to purchase additional DME despite recommendations.  Therefore educated pt and family on safe shower transfer with stepping in over tub ledge with use of grab bar and min assist which pt and pt's daughter return demonstrated safely.  Reasons goals not met:  NA  Recommendation:  Patient will benefit from ongoing skilled OT services in outpatient setting to continue to advance functional skills in the area of BADL, iADL and Reduce care partner burden.  Equipment: tub transfer bench  Reasons for discharge: treatment goals met and discharge from hospital  Patient/family agrees with progress made and goals achieved: Yes  OT Discharge Precautions/Restrictions  Precautions Precautions: Fall General   Vital Signs Therapy Vitals Temp: 98.3 F (36.8 C) Temp Source: Oral Pulse Rate: 75 Resp: 17 BP: 109/65 mmHg Patient Position (if appropriate): Sitting Oxygen Therapy SpO2: 97 % O2 Device: Not Delivered Pain Pain Assessment Pain Assessment: No/denies pain ADL  See FIM Vision/Perception  Vision- History Baseline Vision/History: Wears glasses Wears Glasses: Reading only Vision- Assessment Vision Assessment?:  Yes Tracking/Visual Pursuits: Right eye does not track medially;Decreased smoothness of horizontal tracking;Requires cues, head turns, or add eye shifts to track Saccades: Additional eye shifts occurred during testing;Additional head turns occurred during testing Visual Fields: Left visual field deficit  Cognition Overall Cognitive Status: Impaired/Different from baseline Arousal/Alertness: Awake/alert Orientation Level: Oriented X4 Attention: Alternating Alternating Attention: Impaired Alternating Attention Impairment: Functional complex Memory: Impaired Memory Impairment: Decreased recall of new information;Decreased short term memory Decreased Short Term Memory: Verbal complex;Functional basic Awareness: Impaired Awareness Impairment: Emergent impairment Problem Solving: Impaired Problem Solving Impairment: Functional basic;Verbal complex Behaviors: Impulsive Safety/Judgment: Impaired Sensation Sensation Light Touch: Impaired by gross assessment Stereognosis: Impaired by gross assessment Hot/Cold: Not tested Proprioception: Impaired by gross assessment Additional Comments: Pt reports dull sensation in LUE Coordination Fine Motor Movements are Fluid and Coordinated: No Finger Nose Finger Test: mildly impaired Lt when compared to Rt 9 Hole Peg Test: Rt: 51 seconds, Lt: 1:50 with difficulty picking up pegs secondary to decreased sensation Extremity/Trunk Assessment RUE Assessment RUE Assessment: Within Functional Limits LUE Assessment LUE Assessment: Exceptions to WFL (ROM WFL, strength grosly 3-/5)  See FIM for current functional status  [name], [facility] 09/16/2014, 3:27 PM

## 2014-09-16 NOTE — Progress Notes (Signed)
Physical Therapy Discharge Summary  Patient Details  Name: Kayla [name] MRN: [id number] Date of Birth: 1941/03/21  Today's Date: 09/16/2014 PT Individual Time: 0938-1829 (60 minutes)   Patient has met 10 of 11 long term goals due to improved activity tolerance, improved balance, increased strength, increased range of motion, ability to compensate for deficits and improved coordination.  Patient to discharge at an ambulatory level [place].   Patient's care partner is independent to provide the necessary physical and cognitive assistance at discharge.  Although pt's husband is capable of providing the necessary care, the pt's daughter and granddaughter will also provide assistance at D/C.  Reasons goals not met: Long term goal for memory not met since the pt requires mod cueing in order to attend to her L visual field with functional mobility activities.    Recommendation:  Patient will benefit from ongoing skilled PT services in home health setting to continue to advance safe functional mobility, address ongoing impairments with inattention to L visual field, balance, activity tolerance, abnormality of gait, hemiplegia non-dominant, impaired cognition, impaired sensation and muscle weakness, and minimize fall risk.  Equipment: Conservation officer, nature  Reasons for discharge: treatment goals met and discharge from hospital  Patient/family agrees with progress made and goals achieved: Yes  Skilled Therapeutic Treatment/Update Pt received in recliner; agreeable to therapy.  Session focused on functional mobility, including transfer, functional ambulation, and stairs. Pt negotiated 4 stairs x 2 ascending and 8 stairs descending with min A/min guard and min cueing for step sequencing. Pt performed bed mobility in ADL apartment Mod I.  Pt ambulated with rolling walker and min A/min guard with mod cueing to attend to L visual field in controlled (150'),home (50') environments, and community (200')  environments.  Pt c/o feeling "fuzzy" after activity.  Inquired with RN regarding status change who stated pt was more alert than usual and received 1 unit of insulin in the morning. Vitals taken in both seated and standing positions, and were WNL (see vital signs for detailed readings).  Pt was left supine in bed with bed alarm on and all needs within reach.  HEP left on table in pt's room, but due to time constraints did not demonstrate for pt.      PT Discharge Precautions/Restrictions Precautions Precautions: Fall Restrictions Weight Bearing Restrictions: No Vital Signs Therapy Vitals Pulse Rate: 73 BP: 123/83 mmHg Patient Position (if appropriate): Sitting Oxygen Therapy SpO2: 98 % O2 Device: Not Delivered Pain Pain Assessment Pain Assessment: No/denies pain Vision/Perception  Vision - Assessment Tracking/Visual Pursuits: Right eye does not track medially;Decreased smoothness of horizontal tracking;Requires cues, head turns, or add eye shifts to track Saccades: Additional eye shifts occurred during testing;Additional head turns occurred during testing  Cognition Overall Cognitive Status: Impaired/Different from baseline Arousal/Alertness: Awake/alert Orientation Level: Oriented X4 Attention: Alternating Alternating Attention: Impaired Alternating Attention Impairment: Functional complex Memory: Impaired Memory Impairment: Decreased recall of new information;Decreased short term memory Decreased Short Term Memory: Verbal complex;Functional basic Awareness: Impaired Awareness Impairment: Emergent impairment Problem Solving: Impaired Problem Solving Impairment: Functional basic;Verbal complex Behaviors: Impulsive Safety/Judgment: Impaired Sensation Sensation Light Touch: Impaired by gross assessment Stereognosis: Impaired by gross assessment Hot/Cold: Not tested Proprioception: Impaired by gross assessment Additional Comments: Pt reports dull sensation in  LUE Coordination Fine Motor Movements are Fluid and Coordinated: No Finger Nose Finger Test: mildly impaired Lt when compared to Rt 9 Hole Peg Test: Rt: 51 seconds, Lt: 1:50 with difficulty picking up pegs secondary to decreased sensation Motor  Motor Motor:  Other (comment) Motor - Discharge Observations: L hemiparesis   Mobility Bed Mobility Bed Mobility: Rolling Right;Supine to Sit;Sit to Supine Rolling Right: 6: Modified independent (Device/Increase time) Supine to Sit: 6: Modified independent (Device/Increase time) Sit to Supine: 6: Modified independent (Device/Increase time) Transfers Transfers: Yes Sit to Stand: 4: Min assist Sit to Stand Details: Tactile cues for sequencing;Tactile cues for placement;Verbal cues for technique;Verbal cues for precautions/safety;Verbal cues for sequencing Stand to Sit: 4: Min assist Stand to Sit Details (indicate cue type and reason): Tactile cues for sequencing;Tactile cues for placement;Verbal cues for technique;Verbal cues for sequencing;Verbal cues for precautions/safety Locomotion  Ambulation Ambulation: Yes Ambulation/Gait Assistance: 4: Min guard;4: Min assist Ambulation Distance (Feet): 150 Feet Assistive device: Rolling walker Ambulation/Gait Assistance Details: Tactile cues for sequencing;Tactile cues for placement;Verbal cues for technique;Verbal cues for sequencing;Verbal cues for precautions/safety;Verbal cues for safe use of DME/AE;Manual facilitation for placement Gait Gait Pattern: Within Functional Limits Stairs / Additional Locomotion Stairs: Yes Stairs Assistance: 4: Min guard;4: Min assist Stairs Assistance Details: Tactile cues for sequencing;Verbal cues for sequencing;Verbal cues for technique;Verbal cues for precautions/safety Stair Management Technique: Two rails;Step to pattern Number of Stairs: 8 (4ascending/4 descending, 4 ascending/9 descending) Wheelchair Mobility Wheelchair Mobility: No  Trunk/Postural  Assessment  Cervical Assessment Cervical Assessment: Within Functional Limits Thoracic Assessment Thoracic Assessment: Within Functional Limits Lumbar Assessment Lumbar Assessment: Within Functional Limits Postural Control Postural Control: Within Functional Limits  Balance Berg Balance Test Sit to Stand: Able to stand independently using hands Standing Unsupported: Able to stand safely 2 minutes Sitting with Back Unsupported but Feet Supported on Floor or Stool: Able to sit safely and securely 2 minutes Stand to Sit: Controls descent by using hands Transfers: Able to transfer safely, definite need of hands Standing Unsupported with Eyes Closed: Able to stand 10 seconds safely Standing Ubsupported with Feet Together: Able to place feet together independently but unable to hold for 30 seconds From Standing, Reach Forward with Outstretched Arm: Can reach forward >12 cm safely (5") From Standing Position, Pick up Object from Floor: Able to pick up shoe safely and easily From Standing Position, Turn to Look Behind Over each Shoulder: Looks behind from both sides and weight shifts well Turn 360 Degrees: Needs close supervision or verbal cueing Standing Unsupported, Alternately Place Feet on Step/Stool: Able to complete >2 steps/needs minimal assist Standing Unsupported, One Foot in Front: Able to place foot tandem independently and hold 30 seconds Standing on One Leg: Tries to lift leg/unable to hold 3 seconds but remains standing independently Total Score: 41 Extremity Assessment  RUE Assessment RUE Assessment: Within Functional Limits LUE Assessment LUE Assessment: Exceptions to WFL (ROM WFL, strength grosly 3-/5)  RLE Assessment  RLE Assessment: Exceptions to [facility]  RLE Strength  Right Hip Flexion: 3/5  Right Hip ABduction: 4/5  Right Hip ADduction: 4/5  Right Knee Flexion: 3/5  Right Knee Extension: 4/5  Right Ankle Dorsiflexion: 4/5  Right Ankle Plantar Flexion: 4/5  LLE  Assessment  LLE Assessment: Exceptions to [facility]  LLE Strength  Left Hip Flexion: 3-/5  Left Hip ABduction: 4/5  Left Hip ADduction: 4/5  Left Knee Flexion: 3/5  Left Knee Extension: 4/5  Left Ankle Dorsiflexion: 4/5  Left Ankle Plantar Flexion: 4/5   See FIM for current functional status  Aquarius Tremper 09/16/2014, 5:10 PM

## 2014-09-16 NOTE — Progress Notes (Addendum)
Occupational Therapy Session Note  Patient Details  Name: NAESHA BUCKALEW MRN: [id number] Date of Birth: Apr 30, 1941  Today's Date: 09/16/2014 OT Individual Time: 1300-1410 OT Individual Time Calculation (min): 70 min    Short Term Goals: Week 2:  OT Short Term Goal 1 (Week 2): STG = LTGs due to remaining LOS  Skilled Therapeutic Interventions/Progress Updates:   Completed ADL retraining with pt at overall supervision level. Pt continues to require cues to attend to lt field completely, but will locate items on left when prompted to continue scanning. During ambulation to bathroom in apartment, pt failed to notice lt side of doorway and bumped left RW wheel on door frame indicating need for supervision when ambulating due to hemi-inattention. Therapist provided training on tub shower transfer stepping in with grab bars and sitting on tub bench and lifting legs. Pt agreed method including sitting then brining legs over tub was much safer. Prepared simple meal in microwave demonstrating good sequencing and troubleshooting steps, however reccomend supervision during household ambulation and meal preparation tasks. Planned family education, family was not present, rescheduled to tomorrow.   Therapy Documentation Precautions:  Precautions Precautions: Fall Precaution Comments: L hemi Restrictions Weight Bearing Restrictions: No General:   Vital Signs: Therapy Vitals Temp: 98.3 F (36.8 C) Temp Source: Oral Pulse Rate: 75 Resp: 17 BP: 109/65 mmHg Patient Position (if appropriate): Sitting Oxygen Therapy SpO2: 97 % O2 Device: Not Delivered Pain: Pain Assessment Pain Assessment: No/denies pain  See FIM for current functional status  Therapy/Group: Individual Therapy  Elkin Belfield 09/16/2014, 3:37 PM

## 2014-09-16 NOTE — Discharge Summary (Signed)
NAMEMarland [name]  ALTHA, SWEITZER                ACCOUNT NO.:  [id number]  MEDICAL RECORD NO.:  [id number]  LOCATION:  4M01C                        FACILITY:  [facility]  PHYSICIAN:  Charlett Blake, M.D.DATE OF BIRTH:  [birth year]  DATE OF ADMISSION:  09/05/2014 DATE OF DISCHARGE:  09/17/2014                              DISCHARGE SUMMARY   DISCHARGE DIAGNOSES: 1. Functional deficits secondary to right brain infarct. 2. Subcutaneous Lovenox for DVT prophylaxis. 3. Diabetes mellitus, peripheral neuropathy. 4. Hypertension. 5. Hyperlipidemia.  HISTORY OF PRESENT ILLNESS:  This is 74 year old right-handed female with a history of hypertension, diabetes mellitus as well as sickle cell trait, who was independent with occasional cane prior to admission, living with her husband.  Admitted on August 30, 2014, with altered mental status and left-sided weakness.  MRI of the brain showed acute nonhemorrhagic infarct involving the right occipital lobe and posterior inferior right temporal lobe.  MRA of the head showed high-grade tandem stenosis of the distal right M1 segment and associated proximal right M2 segment.  Carotid Dopplers with no ICA stenosis.  The patient did not receive tPA.  TEE with normal LV function.  Negative saline cavitation study.  Neurology consulted, maintained on aspirin for CVA prophylaxis. Subcutaneous Lovenox for DVT prophylaxis.  Underwent implantable loop recorder on September 03, 2014, per Cardiology Services.  Physical and occupational therapy ongoing.  The patient was admitted for comprehensive rehab program.  PAST MEDICAL HISTORY:  See discharge diagnoses.  SOCIAL HISTORY:  Lives with spouse, independent with occasional cane prior to admission.  Functional status upon admission to rehab services was minimal assist to ambulate 80 feet with a rolling walker, minimal assist stand pivot transfers, min to mod assist for activities of daily living.  PHYSICAL EXAMINATION:  VITAL  SIGNS:  Blood pressure 144/70, pulse 70, temperature 98.2, respirations 16. GENERAL:  This was an alert female, flat affect, but appropriate, made good eye contact with examiner.  She could provide her name, age, as well as date of birth. LUNGS:  Clear to auscultation. CARDIAC:  Regular rate and rhythm. ABDOMEN:  Soft, nontender.  Good bowel sounds.  REHABILITATION HOSPITAL COURSE:  Patient was admitted to inpatient rehab services with therapies initiated on a 3-hour daily basis consisting of physical therapy, occupational therapy, speech therapy, and rehabilitation nursing.  The following issues were addressed during the patient's rehabilitation stay.  Pertaining to Ms. Klipfel' functional deficits secondary to right brain infarct remained stable, maintained on aspirin therapy.  She would follow up with Neurology Services.  She had undergone loop recorder for ongoing evaluation of CVA.  She would follow up with Cardiology Services.  Subcutaneous Lovenox for DVT prophylaxis. No bleeding episodes.  She had a history of diabetes mellitus, peripheral neuropathy.  Hemoglobin A1c of 7.2.  She remained on Amaryl 1 mg daily.  The patient had also been on Glucophage prior to admission. She received full diabetic teaching.  Blood pressures controlled on Tenormin, hydrochlorothiazide, as well as Avapro.  No orthostatic changes.  The patient received weekly collaborative interdisciplinary team conferences to discuss estimated length of stay, family teaching, and any barriers to her discharge.  She did demonstrate a left visual field cut, needing  ongoing cues.  Ambulates 150 feet with rolling walker, minimal guard, again cues for visual field cut.  Ongoing full education with family.  Minimal assist for furniture transfers, navigating stairs, and car transfers, using an assistive device. Activities daily living, she engaged in functional mobility around the gym with minimal assist, some increased  time with turning.  She was discharged to home with ongoing therapies dictated per rehab services.  DISCHARGE MEDICATIONS:  Aspirin 325 mg p.o. daily, Tenormin 50 mg p.o. daily, Lipitor 20 mg p.o. daily, Amaryl 1 mg p.o. daily, hydrochlorothiazide 25 mg p.o. daily, Avapro 300 mg p.o. daily, MiraLAX daily and hold for loose stools.  DIET:  Diabetic diet.  FOLLOWUP:  She would follow up Dr. Alysia Penna at the outpatient rehab service office as directed; Dr. Erlinda Hong, Neurology Services, call for appointment; Dr. Rayann Heman, Cardiology Services, call for appointment; Dr. Vertell Novak, Medical Management on September 23, 2014.     Lauraine Rinne, P.A.   ______________________________ Charlett Blake, M.D.    DA/MEDQ  D:  09/16/2014  T:  09/16/2014  Job:  [id number]  cc:   Vertell Novak, MD Thompson Grayer, MD Rosalin Hawking, MD

## 2014-09-16 NOTE — Discharge Summary (Signed)
Discharge summary job # [id number]

## 2014-09-16 NOTE — Progress Notes (Signed)
Speech Language Pathology Daily Session Note  Patient Details  Name: Kayla [name] MRN: [id number] Date of Birth: [birth year]  Today's Date: 09/16/2014 SLP Individual Time: 1003-1103 SLP Individual Time Calculation (min): 60 min  Short Term Goals: Week 2: SLP Short Term Goal 1 (Week 2): Patient will demonstrate basic problem-solving in 75% of observable opportunities given Min A verbal and visual cues. SLP Short Term Goal 2 (Week 2): Patient will utilize external aids to assist in recall of new, daily information over 3 consecutive sessions given Min A verbal and visual cues. SLP Short Term Goal 3 (Week 2): Patient will demonstrate alternating attention between functional tasks in a quiet environment for 5 minutes given Min A verbal cues. SLP Short Term Goal 4 (Week 2): Patient will self-monitor and correct errors during self-care and home management tasks given Min A question cues. SLP Short Term Goal 5 (Week 2): Patient will attend to left of environment in 75% of observable opportunities given Min A verbal and visual cues.  Skilled Therapeutic Interventions:   Pt was seen for skilled ST targeting cognitive goals.  Upon arrival, pt was reclined in bed, awake, lethargic but agreeable to participate in ST with min encouragement.  SLP facilitated the session with a structured new learning activity targeting planning, organization, and error awareness for functional problem solving.  Pt required max assist verbal and visual cues to complete the abovementioned task due to decreased working memory (despite use of written aid) and mental flexibility for generating multiple solutions to a targeted scenario.  Pt demonstrated significantly improved functional problem solving and use of memory aids during a more basic and familiar kitchen task in the ADL apartment.  Pt located 10 items from the kitchen cabinets with min assist verbal faded to supervision cues for use of written memory aid and min assist faded  to supervision cues to attend to the left of the environment.  No family present for training today.  SLP provided pt with handouts for memory compensatory strategies, activities for cognitive remediation/compensation, and left attention strategies.  Will attempt to follow up for family education prior to discharge tomorrow.     FIM:  Comprehension Comprehension Mode: Auditory Comprehension: 5-Follows basic conversation/direction: With no assist Expression Expression Mode: Verbal Expression: 5-Expresses basic 90% of the time/requires cueing < 10% of the time. Social Interaction Social Interaction: 5-Interacts appropriately 90% of the time - Needs monitoring or encouragement for participation or interaction. Problem Solving Problem Solving: 4-Solves basic 75 - 89% of the time/requires cueing 10 - 24% of the time Memory Memory: 4-Recognizes or recalls 75 - 89% of the time/requires cueing 10 - 24% of the time  Pain Pain Assessment Pain Assessment: No/denies pain  Therapy/Group: Individual Therapy  Claudia Alvizo, Selinda Orion 09/16/2014, 2:30 PM

## 2014-09-16 NOTE — Progress Notes (Signed)
Subjective/Complaints: No complaints. Had a good weekend Review of Systems - Negative except as above   Objective: Vital Signs: Blood pressure 137/74, pulse 65, temperature 98.2 F (36.8 C), temperature source Oral, resp. rate 17, height 5\' 6"  (1.676 m), weight 89 kg (196 lb 3.4 oz), SpO2 96 %. No results found. Results for orders placed or performed during the hospital encounter of 09/05/14 (from the past 72 hour(s))  Glucose, capillary     Status: None   Collection Time: 09/13/14 11:28 AM  Result Value Ref Range   Glucose-Capillary 92 70 - 99 mg/dL  Glucose, capillary     Status: Abnormal   Collection Time: 09/13/14  4:33 PM  Result Value Ref Range   Glucose-Capillary 166 (H) 70 - 99 mg/dL  Glucose, capillary     Status: Abnormal   Collection Time: 09/13/14  8:57 PM  Result Value Ref Range   Glucose-Capillary 149 (H) 70 - 99 mg/dL  Glucose, capillary     Status: Abnormal   Collection Time: 09/14/14  6:47 AM  Result Value Ref Range   Glucose-Capillary 141 (H) 70 - 99 mg/dL  Glucose, capillary     Status: Abnormal   Collection Time: 09/14/14 11:55 AM  Result Value Ref Range   Glucose-Capillary 193 (H) 70 - 99 mg/dL   Comment 1 Notify RN   Glucose, capillary     Status: None   Collection Time: 09/14/14  4:43 PM  Result Value Ref Range   Glucose-Capillary 95 70 - 99 mg/dL   Comment 1 Notify RN   Glucose, capillary     Status: Abnormal   Collection Time: 09/14/14  8:59 PM  Result Value Ref Range   Glucose-Capillary 171 (H) 70 - 99 mg/dL  Glucose, capillary     Status: Abnormal   Collection Time: 09/15/14  6:50 AM  Result Value Ref Range   Glucose-Capillary 159 (H) 70 - 99 mg/dL  Glucose, capillary     Status: Abnormal   Collection Time: 09/15/14 11:36 AM  Result Value Ref Range   Glucose-Capillary 139 (H) 70 - 99 mg/dL   Comment 1 Notify RN   Glucose, capillary     Status: Abnormal   Collection Time: 09/15/14  4:21 PM  Result Value Ref Range   Glucose-Capillary 144  (H) 70 - 99 mg/dL   Comment 1 Notify RN   Glucose, capillary     Status: Abnormal   Collection Time: 09/15/14  8:41 PM  Result Value Ref Range   Glucose-Capillary 192 (H) 70 - 99 mg/dL  Glucose, capillary     Status: Abnormal   Collection Time: 09/16/14  6:45 AM  Result Value Ref Range   Glucose-Capillary 146 (H) 70 - 99 mg/dL     HEENT: normal Cardio: RRR and no murmur Resp: CTA B/L and unlabored GI: BS positive and NT, ND Extremity:  Pulses positive and No Edema Skin:   Intact Neuro: Alert/Oriented, Normal Sensory, Abnormal Motor 4-/5 L Bi, Tri,Grip, Musc/Skel:  Normal and Other no pain with AROM BUE and BLE Gen NAD   Assessment/Plan: 1. Functional deficits secondary to Right temporal occipital lobe infarct, posterior cerebral artery distribution which require 3+ hours per day of interdisciplinary therapy in a comprehensive inpatient rehab setting. Physiatrist is providing close team supervision and 24 hour management of active medical problems listed below. Physiatrist and rehab team continue to assess barriers to discharge/monitor patient progress toward functional and medical goals.  FIM: FIM - Bathing Bathing Steps Patient Completed: Chest, Right  Arm, Left Arm, Abdomen, Front perineal area, Buttocks, Right upper leg, Left upper leg, Right lower leg (including foot), Left lower leg (including foot) Bathing: 5: Set-up assist to: Obtain items  FIM - Upper Body Dressing/Undressing Upper body dressing/undressing steps patient completed: Thread/unthread right bra strap, Thread/unthread left bra strap, Thread/unthread right sleeve of pullover shirt/dresss, Thread/unthread left sleeve of pullover shirt/dress, Put head through opening of pull over shirt/dress, Pull shirt over trunk Upper body dressing/undressing: 4: Min-Patient completed 75 plus % of tasks FIM - Lower Body Dressing/Undressing Lower body dressing/undressing steps patient completed: Thread/unthread right underwear leg,  Thread/unthread left underwear leg, Pull underwear up/down, Thread/unthread right pants leg, Thread/unthread left pants leg, Pull pants up/down, Don/Doff right sock Lower body dressing/undressing: 4: Min-Patient completed 75 plus % of tasks  FIM - Toileting Toileting steps completed by patient: Adjust clothing prior to toileting, Performs perineal hygiene, Adjust clothing after toileting Toileting Assistive Devices: Grab bar or rail for support Toileting: 5: Supervision: Safety issues/verbal cues  FIM - Radio producer Devices: Grab bars, Insurance account manager Transfers: 5-To toilet/BSC: Supervision (verbal cues/safety issues)  FIM - Control and instrumentation engineer Devices: Arm rests, Copy: 5: Supine > Sit: Supervision (verbal cues/safety issues), 5: Sit > Supine: Supervision (verbal cues/safety issues), 4: Chair or W/C > Bed: Min A (steadying Pt. > 75%), 4: Bed > Chair or W/C: Min A (steadying Pt. > 75%)  FIM - Locomotion: Wheelchair Distance: 160 Locomotion: Wheelchair: 0: Activity did not occur FIM - Locomotion: Ambulation Locomotion: Ambulation Assistive Devices: Administrator Ambulation/Gait Assistance: 4: Min guard, 5: Supervision Locomotion: Ambulation: 2: Travels 50 - 149 ft with minimal assistance (Pt.>75%)  Comprehension Comprehension Mode: Auditory Comprehension: 5-Follows basic conversation/direction: With extra time/assistive device  Expression Expression Mode: Verbal Expression: 5-Expresses complex 90% of the time/cues < 10% of the time  Social Interaction Social Interaction: 5-Interacts appropriately 90% of the time - Needs monitoring or encouragement for participation or interaction.  Problem Solving Problem Solving Mode: Not assessed Problem Solving: 4-Solves basic 75 - 89% of the time/requires cueing 10 - 24% of the time  Memory Memory Mode: Not assessed Memory: 3-Recognizes or recalls 50 - 74% of  the time/requires cueing 25 - 49% of the time  Medical Problem List and Plan: 1. Functional deficits secondary to right brain infarct with left hemiparesis. Status post loop recorder 09/03/2014 2. DVT Prophylaxis/Anticoagulation: Subcutaneous Lovenox. Monitor platelet counts and any signs of bleeding. 3. Pain Management: Tylenol as needed 4. Diabetes mellitus with peripheral neuropathy. Hemoglobin A1c 7.2. Check blood sugars before meals and at bedtime. Presently on sliding scale insulin. Patient on Glucophage 500 mg daily prior to admission. Resume as tolerated, elevated creat will hold off for now, consider amaryl 5. Neuropsych: This patient is capable of making decisions on her own behalf. 6. Skin/Wound Care: Routine skin checks 7. Fluids/Electrolytes/Nutrition: eating well 8. Hypertension. Presently on Tenormin 50 mg daily, hydrochlorothiazide 25 mg daily, Avapro 300 mg daily. Monitor with increased mobility. 9. Hyperlipidemia. Lipitor  LOS (Days) 11 A FACE TO FACE EVALUATION WAS PERFORMED  Kayde Warehime T 09/16/2014, 7:50 AM

## 2014-09-17 ENCOUNTER — Encounter (HOSPITAL_COMMUNITY): Payer: Medicare HMO | Admitting: Occupational Therapy

## 2014-09-17 ENCOUNTER — Encounter (HOSPITAL_COMMUNITY): Payer: Medicare HMO | Admitting: Speech Pathology

## 2014-09-17 LAB — GLUCOSE, CAPILLARY
GLUCOSE-CAPILLARY: 107 mg/dL — AB (ref 70–99)
Glucose-Capillary: 129 mg/dL — ABNORMAL HIGH (ref 70–99)

## 2014-09-17 MED ORDER — GLIMEPIRIDE 1 MG PO TABS: 1.0000 mg | ORAL_TABLET | Freq: Every day | ORAL | Status: DC

## 2014-09-17 MED ORDER — ATENOLOL 50 MG PO TABS: 50.0000 mg | ORAL_TABLET | Freq: Every day | ORAL | Status: DC

## 2014-09-17 MED ORDER — ATORVASTATIN CALCIUM 20 MG PO TABS: 20.0000 mg | ORAL_TABLET | Freq: Every day | ORAL | Status: DC

## 2014-09-17 MED ORDER — BENICAR HCT 40-25 MG PO TABS: 1.0000 | ORAL_TABLET | Freq: Every day | ORAL | Status: DC

## 2014-09-17 NOTE — Progress Notes (Signed)
Patient discharged at 89 with daughter and granddaughter. Discharge instructions were explained and provided by Silvestre Mesi, PA. No further questions at discharge. NT wheeled patient down to the discharged area. Kori Goins, Dione Plover

## 2014-09-17 NOTE — Progress Notes (Signed)
Speech Language Pathology Daily Session Note  Patient Details  Name: Kayla [name] MRN: [id number] Date of Birth: Apr 08, 1941  Today's Date: 09/17/2014 SLP Individual Time: 1110-1130 SLP Individual Time Calculation (min): 20 min  Short Term Goals: Week 2: SLP Short Term Goal 1 (Week 2): Patient will demonstrate basic problem-solving in 75% of observable opportunities given Min A verbal and visual cues. SLP Short Term Goal 2 (Week 2): Patient will utilize external aids to assist in recall of new, daily information over 3 consecutive sessions given Min A verbal and visual cues. SLP Short Term Goal 3 (Week 2): Patient will demonstrate alternating attention between functional tasks in a quiet environment for 5 minutes given Min A verbal cues. SLP Short Term Goal 4 (Week 2): Patient will self-monitor and correct errors during self-care and home management tasks given Min A question cues. SLP Short Term Goal 5 (Week 2): Patient will attend to left of environment in 75% of observable opportunities given Min A verbal and visual cues.  Skilled Therapeutic Interventions:  Pt was seen for skilled ST targeting family education goals.  [name] and granddaughter were present upon arrival.  SLP reviewed and reinforced recommendations that pt have assistance for medication and financial management as well as 24/7 supervision due to cognitive deficits.  SLP also provided skilled compensatory strategy instruction for memory and left attention.  [name] was provided with handout of activities for cognitive reorganization to facilitate carryover of ST in between treatment sessions.  [name] was in agreement with recommendations and verbalized understanding of all education.   [name] questions were answered to their satisfaction at this time. Pt is on track for discharge today.    FIM:  Comprehension Comprehension Mode: Auditory Comprehension: 5-Follows basic conversation/direction: With  no assist Expression Expression Mode: Verbal Expression: 5-Expresses basic 90% of the time/requires cueing < 10% of the time. Social Interaction Social Interaction: 5-Interacts appropriately 90% of the time - Needs monitoring or encouragement for participation or interaction. Problem Solving Problem Solving: 4-Solves basic 75 - 89% of the time/requires cueing 10 - 24% of the time Memory Memory: 5-Recognizes or recalls 90% of the time/requires cueing < 10% of the time  Pain Pain Assessment Pain Assessment: No/denies pain  Therapy/Group: Individual Therapy  Ascencion Stegner, Selinda Orion 09/17/2014, 4:15 PM

## 2014-09-17 NOTE — Progress Notes (Signed)
Social Work Discharge Note Discharge Note  The overall goal for the admission was met for:   Discharge location: Yes-HOME WITH HUSBAND AND OTHER FAMILY MEMBERS WHO WILL PROVIDE CARE-24 HR  Length of Stay: Yes-12 DAYS  Discharge activity level: Yes-SUPERVISION WITH CUES  Home/community participation: Yes  Services provided included: MD, RD, PT, OT, SLP, RN, CM, TR, Pharmacy and SW  Financial Services: Private Insurance: [employer]  Follow-up services arranged: Home Health: [facility]-PT, OT, SP, RN and Patient/Family request agency HH: [facility], DME: [facility] HAD BEFORE  Comments (or additional information):MULTIPLE FAMILY MEMBERS WERE HERE DAILY TO GO THROUGH THERAPIES WITH PT AND ARE AWARE OF PT'S CARE NEEDS. DAUGHTER FEELS GOOD ABOUT DISCHARGE TODAY, ALL QUESTIONS ANSWERED.  Patient/Family verbalized understanding of follow-up arrangements: Yes  Individual responsible for coordination of the follow-up plan: [name]  Confirmed correct DME delivered: Elease Hashimoto 09/17/2014    Elease Hashimoto

## 2014-09-17 NOTE — Progress Notes (Signed)
Subjective/Complaints: No problems. Happy to be going home! Review of Systems - Negative except as above   Objective: Vital Signs: Blood pressure 111/70, pulse 66, temperature 97.8 F (36.6 C), temperature source Oral, resp. rate 16, height $RemoveBe'5\' 6"'[id number]$  (1.676 m), weight 89 kg (196 lb 3.4 oz), SpO2 100 %. No results found. Results for orders placed or performed during the hospital encounter of 09/05/14 (from the past 72 hour(s))  Glucose, capillary     Status: Abnormal   Collection Time: 09/14/14 11:55 AM  Result Value Ref Range   Glucose-Capillary 193 (H) 70 - 99 mg/dL   Comment 1 Notify RN   Glucose, capillary     Status: None   Collection Time: 09/14/14  4:43 PM  Result Value Ref Range   Glucose-Capillary 95 70 - 99 mg/dL   Comment 1 Notify RN   Glucose, capillary     Status: Abnormal   Collection Time: 09/14/14  8:59 PM  Result Value Ref Range   Glucose-Capillary 171 (H) 70 - 99 mg/dL  Glucose, capillary     Status: Abnormal   Collection Time: 09/15/14  6:50 AM  Result Value Ref Range   Glucose-Capillary 159 (H) 70 - 99 mg/dL  Glucose, capillary     Status: Abnormal   Collection Time: 09/15/14 11:36 AM  Result Value Ref Range   Glucose-Capillary 139 (H) 70 - 99 mg/dL   Comment 1 Notify RN   Glucose, capillary     Status: Abnormal   Collection Time: 09/15/14  4:21 PM  Result Value Ref Range   Glucose-Capillary 144 (H) 70 - 99 mg/dL   Comment 1 Notify RN   Glucose, capillary     Status: Abnormal   Collection Time: 09/15/14  8:41 PM  Result Value Ref Range   Glucose-Capillary 192 (H) 70 - 99 mg/dL  Glucose, capillary     Status: Abnormal   Collection Time: 09/16/14  6:45 AM  Result Value Ref Range   Glucose-Capillary 146 (H) 70 - 99 mg/dL  Glucose, capillary     Status: Abnormal   Collection Time: 09/16/14 11:33 AM  Result Value Ref Range   Glucose-Capillary 125 (H) 70 - 99 mg/dL  Glucose, capillary     Status: Abnormal   Collection Time: 09/16/14  4:49 PM  Result  Value Ref Range   Glucose-Capillary 111 (H) 70 - 99 mg/dL  Glucose, capillary     Status: Abnormal   Collection Time: 09/16/14  9:04 PM  Result Value Ref Range   Glucose-Capillary 164 (H) 70 - 99 mg/dL  Glucose, capillary     Status: Abnormal   Collection Time: 09/17/14  6:45 AM  Result Value Ref Range   Glucose-Capillary 129 (H) 70 - 99 mg/dL     HEENT: normal Cardio: RRR and no murmur Resp: CTA B/L and unlabored GI: BS positive and NT, ND Extremity:  Pulses positive and No Edema Skin:   Intact Neuro: Alert/Oriented, Normal Sensory, Abnormal Motor 4-/5 L Bi, Tri,Grip, Musc/Skel:  Normal and Other no pain with AROM BUE and BLE Gen NAD   Assessment/Plan: 1. Functional deficits secondary to Right temporal occipital lobe infarct, posterior cerebral artery distribution which require 3+ hours per day of interdisciplinary therapy in a comprehensive inpatient rehab setting. Physiatrist is providing close team supervision and 24 hour management of active medical problems listed below. Physiatrist and rehab team continue to assess barriers to discharge/monitor patient progress toward functional and medical goals.  Dc home today. Goals met, follow up arranged.  Family ed today  FIM: FIM - Bathing Bathing Steps Patient Completed: Chest, Right Arm, Left Arm, Abdomen, Front perineal area, Buttocks, Right upper leg, Left upper leg, Right lower leg (including foot), Left lower leg (including foot) Bathing: 5: Set-up assist to: Obtain items  FIM - Upper Body Dressing/Undressing Upper body dressing/undressing steps patient completed: Thread/unthread right bra strap, Thread/unthread left bra strap, Thread/unthread right sleeve of pullover shirt/dresss, Thread/unthread left sleeve of pullover shirt/dress, Put head through opening of pull over shirt/dress, Pull shirt over trunk, Hook/unhook bra Upper body dressing/undressing: 7: Complete Independence: No helper FIM - Lower Body  Dressing/Undressing Lower body dressing/undressing steps patient completed: Thread/unthread right underwear leg, Thread/unthread left underwear leg, Pull underwear up/down, Thread/unthread right pants leg, Thread/unthread left pants leg, Pull pants up/down, Don/Doff right sock, Don/Doff left sock, Don/Doff right shoe, Don/Doff left shoe, Fasten/unfasten right shoe, Fasten/unfasten left shoe Lower body dressing/undressing: 5: Supervision: Safety issues/verbal cues  FIM - Toileting Toileting steps completed by patient: Adjust clothing prior to toileting, Performs perineal hygiene, Adjust clothing after toileting Toileting Assistive Devices: Grab bar or rail for support Toileting: 7: Independent: No helper, no device  FIM - Radio producer Devices: Grab bars, Insurance account manager Transfers: 5-To toilet/BSC: Supervision (verbal cues/safety issues)  FIM - Control and instrumentation engineer Devices: Environmental consultant, Arm rests Bed/Chair Transfer: 6: Sit > Supine: No assist, 6: Supine > Sit: No assist, 4: Bed > Chair or W/C: Min A (steadying Pt. > 75%), 4: Chair or W/C > Bed: Min A (steadying Pt. > 75%)  FIM - Locomotion: Wheelchair Distance: 160 Locomotion: Wheelchair: 0: Activity did not occur FIM - Locomotion: Ambulation Locomotion: Ambulation Assistive Devices: Administrator Ambulation/Gait Assistance: 4: Min guard, 4: Min assist Locomotion: Ambulation: 5: Household Independent - travels 50 - 149 ft independent or modified independent  Comprehension Comprehension Mode: Auditory Comprehension: 5-Follows basic conversation/direction: With no assist  Expression Expression Mode: Verbal Expression: 5-Expresses basic 90% of the time/requires cueing < 10% of the time.  Social Interaction Social Interaction: 5-Interacts appropriately 90% of the time - Needs monitoring or encouragement for participation or interaction.  Problem Solving Problem Solving Mode: Not  assessed Problem Solving: 4-Solves basic 75 - 89% of the time/requires cueing 10 - 24% of the time  Memory Memory Mode: Not assessed Memory: 4-Recognizes or recalls 75 - 89% of the time/requires cueing 10 - 24% of the time  Medical Problem List and Plan: 1. Functional deficits secondary to right brain infarct with left hemiparesis. Status post loop recorder 09/03/2014 2. DVT Prophylaxis/Anticoagulation: Subcutaneous Lovenox. Monitor platelet counts and any signs of bleeding. 3. Pain Management: Tylenol as needed 4. Diabetes mellitus with peripheral neuropathy. Hemoglobin A1c 7.2. Check blood sugars before meals and at bedtime. Presently on sliding scale insulin. Patient on Glucophage 500 mg daily prior to admission. Diet controlled currently 5. Neuropsych: This patient is capable of making decisions on her own behalf. 6. Skin/Wound Care: Routine skin checks 7. Fluids/Electrolytes/Nutrition: eating well 8. Hypertension. Presently on Tenormin 50 mg daily, hydrochlorothiazide 25 mg daily, Avapro 300 mg daily. Monitor with increased mobility. 9. Hyperlipidemia. Lipitor  LOS (Days) 12 A FACE TO FACE EVALUATION WAS PERFORMED  Lalonnie Shaffer T 09/17/2014, 8:09 AM

## 2014-09-17 NOTE — Progress Notes (Signed)
Occupational Therapy Session Note  Patient Details  Name: Kayla [name] MRN: [id number] Date of Birth: Mar 23, 1941  Today's Date: 09/17/2014 OT Individual Time: 1035-1110 OT Individual Time Calculation (min): 35 min    Short Term Goals: Week 2:  OT Short Term Goal 1 (Week 2): STG = LTGs due to remaining LOS  Skilled Therapeutic Interventions/Progress Updates:    Engaged in hands on family education with focus on providing close supervision during all mobility, transfers, and self-care tasks.  [name] and granddaughter present for education with each return demonstrating safety and appropriate cues to increase pt safety.  Educated on safe transfer technique in and out of shower, with family reporting plan to use already owned shower chair.  Recommended grab bars for increased safety in shower.  Provided pt with [facility] HEP and encouraged any participation in [facility] tasks with Lt hand.  Therapy Documentation Precautions:  Precautions Precautions: Fall Precaution Comments: L hemi Restrictions Weight Bearing Restrictions: No Pain:  Pt with no c/o pain  See FIM for current functional status  Therapy/Group: Individual Therapy  Simonne Come 09/17/2014, 3:40 PM

## 2014-09-18 ENCOUNTER — Telehealth: Payer: Self-pay | Admitting: *Deleted

## 2014-09-18 NOTE — Telephone Encounter (Signed)
Transition Care Management Follow-up Telephone Call D/C 09/17/14  How have you been since you were released from the hospital? Spoke with daughter Olin Hauser) pt was there too. She states she is doing ok   Do you understand why you were in the hospital? YES   Do you understand the discharge instrcutions? YES  Items Reviewed:  Medications reviewed: YES  Allergies reviewed: YES  Dietary changes reviewed: NO  Referrals reviewed: YES, appt has been made to cardiologist for 3/31, and Neurologist (Dr. Read Drivers) in May   Functional Questionnaire:   Activities of Daily Living (ADLs):   Daughter states she are independent in the following: ambulation, bathing and hygiene, feeding, continence, grooming, toileting and dressing States mom does well does not need assistance   Any transportation issues/concerns?: NO   Any patient concerns? NO   Confirmed importance and date/time of follow-up visits scheduled: YES, family wasn't aware that the appt was made already inform appt 09/23/14 w/Dr. Doug Sou   Confirmed with patient if condition begins to worsen call PCP or go to the ER.

## 2014-09-19 ENCOUNTER — Ambulatory Visit (INDEPENDENT_AMBULATORY_CARE_PROVIDER_SITE_OTHER): Payer: Medicare HMO | Admitting: *Deleted

## 2014-09-19 DIAGNOSIS — I639 Cerebral infarction, unspecified: Secondary | ICD-10-CM

## 2014-09-19 LAB — MDC_IDC_ENUM_SESS_TYPE_INCLINIC
Date Time Interrogation Session: 20160331100828
MDC IDC SET ZONE DETECTION INTERVAL: 2000 ms
MDC IDC SET ZONE DETECTION INTERVAL: 3000 ms
Zone Setting Detection Interval: 380 ms

## 2014-09-19 NOTE — Progress Notes (Signed)
Wound check in clinic s/p ILR implant. Steri strips removed. Wound well healed without redness or edema.  Pt with 0 tachy episodes; 0 brady episodes; 0 asystole; 0 symptom; 0 AF episodes. Plan to follow up via [facility] and with JA prn.

## 2014-09-23 ENCOUNTER — Ambulatory Visit (INDEPENDENT_AMBULATORY_CARE_PROVIDER_SITE_OTHER): Payer: Medicare HMO | Admitting: Internal Medicine

## 2014-09-23 ENCOUNTER — Encounter: Payer: Self-pay | Admitting: Internal Medicine

## 2014-09-23 VITALS — BP 138/82 | HR 63 | Temp 98.6°F | Resp 16 | Wt 188.0 lb

## 2014-09-23 DIAGNOSIS — E1140 Type 2 diabetes mellitus with diabetic neuropathy, unspecified: Secondary | ICD-10-CM | POA: Diagnosis not present

## 2014-09-23 DIAGNOSIS — I10 Essential (primary) hypertension: Secondary | ICD-10-CM | POA: Diagnosis not present

## 2014-09-23 DIAGNOSIS — I63531 Cerebral infarction due to unspecified occlusion or stenosis of right posterior cerebral artery: Secondary | ICD-10-CM

## 2014-09-23 DIAGNOSIS — E119 Type 2 diabetes mellitus without complications: Secondary | ICD-10-CM

## 2014-09-23 DIAGNOSIS — R9389 Abnormal findings on diagnostic imaging of other specified body structures: Secondary | ICD-10-CM

## 2014-09-23 DIAGNOSIS — E7800 Pure hypercholesterolemia, unspecified: Secondary | ICD-10-CM

## 2014-09-23 DIAGNOSIS — H53462 Homonymous bilateral field defects, left side: Secondary | ICD-10-CM

## 2014-09-23 DIAGNOSIS — R938 Abnormal findings on diagnostic imaging of other specified body structures: Secondary | ICD-10-CM

## 2014-09-23 MED ORDER — GLUCOSE BLOOD VI STRP: ORAL_STRIP | Status: DC

## 2014-09-23 MED ORDER — GLIMEPIRIDE 1 MG PO TABS: 1.0000 mg | ORAL_TABLET | Freq: Every day | ORAL | Status: DC

## 2014-09-23 NOTE — Progress Notes (Signed)
Pre visit review using our clinic review tool, if applicable. No additional management support is needed unless otherwise documented below in the visit note. 

## 2014-09-23 NOTE — Patient Instructions (Signed)
Pick up the senokot as it has two medicines. One helps to move the guts better and the other makes the stools easier to pass and not hard. Keep taking the miralax as well. Consider adding fiber (and water) to the diet as well to move the guts along better.   When you eat more fiber you need to work on drinking about 5-6 glasses of fluid (water or juice or coffee or other) each day to help the gut. If you eat a lot of fiber and do not drink water it turns into a brick in your colon that does not like to move well.   Come back in 1-3 months to check in with how you are doing. If you have any problems or questions before then please feel free to call us.   High-Fiber Diet Fiber is found in fruits, vegetables, and grains. A high-fiber diet encourages the addition of more whole grains, legumes, fruits, and vegetables in your diet. The recommended amount of fiber for adult males is 38 g per day. For adult females, it is 25 g per day. Pregnant and lactating women should get 28 g of fiber per day. If you have a digestive or bowel problem, ask your caregiver for advice before adding high-fiber foods to your diet. Eat a variety of high-fiber foods instead of only a select few type of foods.  PURPOSE  To increase stool bulk.  To make bowel movements more regular to prevent constipation.  To lower cholesterol.  To prevent overeating. WHEN IS THIS DIET USED?  It may be used if you have constipation and hemorrhoids.  It may be used if you have uncomplicated diverticulosis (intestine condition) and irritable bowel syndrome.  It may be used if you need help with weight management.  It may be used if you want to add it to your diet as a protective measure against atherosclerosis, diabetes, and cancer. SOURCES OF FIBER  Whole-grain breads and cereals.  Fruits, such as apples, oranges, bananas, berries, prunes, and pears.  Vegetables, such as green peas, carrots, sweet potatoes, beets, broccoli,  cabbage, spinach, and artichokes.  Legumes, such split peas, soy, lentils.  Almonds. FIBER CONTENT IN FOODS Starches and Grains / Dietary Fiber (g)  Cheerios, 1 cup / 3 g  Corn Flakes cereal, 1 cup / 0.7 g  Rice crispy treat cereal, 1 cup / 0.3 g  Instant oatmeal (cooked),  cup / 2 g  Frosted wheat cereal, 1 cup / 5.1 g  Brown, long-grain rice (cooked), 1 cup / 3.5 g  White, long-grain rice (cooked), 1 cup / 0.6 g  Enriched macaroni (cooked), 1 cup / 2.5 g Legumes / Dietary Fiber (g)  Baked beans (canned, plain, or vegetarian),  cup / 5.2 g  Kidney beans (canned),  cup / 6.8 g  Pinto beans (cooked),  cup / 5.5 g Breads and Crackers / Dietary Fiber (g)  Plain or honey graham crackers, 2 squares / 0.7 g  Saltine crackers, 3 squares / 0.3 g  Plain, salted pretzels, 10 pieces / 1.8 g  Whole-wheat bread, 1 slice / 1.9 g  White bread, 1 slice / 0.7 g  Raisin bread, 1 slice / 1.2 g  Plain bagel, 3 oz / 2 g  Flour tortilla, 1 oz / 0.9 g  Corn tortilla, 1 small / 1.5 g  Hamburger or hotdog bun, 1 small / 0.9 g Fruits / Dietary Fiber (g)  Apple with skin, 1 medium / 4.4 g  Sweetened  applesauce,  cup / 1.5 g  Banana,  medium / 1.5 g  Grapes, 10 grapes / 0.4 g  Orange, 1 small / 2.3 g  Raisin, 1.5 oz / 1.6 g  Melon, 1 cup / 1.4 g Vegetables / Dietary Fiber (g)  Green beans (canned),  cup / 1.3 g  Carrots (cooked),  cup / 2.3 g  Broccoli (cooked),  cup / 2.8 g  Peas (cooked),  cup / 4.4 g  Mashed potatoes,  cup / 1.6 g  Lettuce, 1 cup / 0.5 g  Corn (canned),  cup / 1.6 g  Tomato,  cup / 1.1 g Document Released: 06/07/2005 Document Revised: 12/07/2011 Document Reviewed: 09/09/2011 [employer] Patient Information 2015 [place], [place]. This information is not intended to replace advice given to you by your health care provider. Make sure you discuss any questions you have with your health care provider.

## 2014-09-23 NOTE — Progress Notes (Signed)
   Subjective:    Patient ID: Kayla [name], female    DOB: [birth year], 74 y.o.   MRN: [id number]  HPI The patient is a 74 YO female who is coming in for a hospital follow up. (records reviewed and in the hospital for new stroke, no atrial fibrillation of carotid stenosis found) She is doing well since leaving the hospital. Physical therapy is going to come to the house to work on further rehab. She completed some in the hospital. She is able to get around with the walker and denies falls.  She did follow up with the cardiologist earlier and they said that her heart monitor looked good. No recurrent symptoms of weakness, speech problems, numbness, facial drooping since being home. She is taking the aspirin daily. Still feels weak overall.   Review of Systems  Constitutional: Negative for fever, activity change, appetite change, fatigue and unexpected weight change.  HENT: Negative.   Respiratory: Negative for chest tightness and shortness of breath.   Cardiovascular: Negative for chest pain, palpitations and leg swelling.  Gastrointestinal: Negative.   Musculoskeletal: Negative.   Skin: Negative.   Neurological: Positive for weakness and numbness. Negative for dizziness, seizures, syncope, facial asymmetry, light-headedness and headaches.       Some change in sensation strength from right to left      Objective:   Physical Exam  Constitutional: She is oriented to person, place, and time. She appears well-developed and well-nourished.  HENT:  Head: Normocephalic and atraumatic.  Eyes: EOM are normal.  Neck: Normal range of motion.  Cardiovascular: Normal rate and regular rhythm.   Pulmonary/Chest: Effort normal.  Abdominal: Soft.  Neurological: She is alert and oriented to person, place, and time. Coordination abnormal.  Some decreased sensation on the left arm to fine touch, slight muscle weakness on left compared to right but both 3-4/5 strength.   Skin: Skin is warm and dry.      Filed Vitals:   09/23/14 1340  BP: 138/82  Pulse: 63  Temp: 98.6 F (37 C)  TempSrc: Oral  Resp: 16  Weight: 188 lb (85.276 kg)  SpO2: 97%      Assessment & Plan:

## 2014-09-25 NOTE — Assessment & Plan Note (Signed)
Optimized for secondary prevention and on lipitor now. Doing well and no side effects.

## 2014-09-25 NOTE — Assessment & Plan Note (Signed)
Still awaiting CT chest to evaluate nodule in the lung. Reinforced with them that this is something that can be done outpatient (They were disappointed that it was not done in the hospital since they were doing other imaging).

## 2014-09-25 NOTE — Assessment & Plan Note (Signed)
They did change her from metformin to glimiperide (this is best sulfonylurea in older adults to decrease risk of hypoglycemia). She denies hypoglycemia since leaving the hospital. Will evaluate in another month how they are doing. If any symptoms of hypoglycemia they are to check and call us to inform.

## 2014-09-25 NOTE — Addendum Note (Signed)
Addended by: Vertell Novak A on: 09/25/2014 07:49 AM   Modules accepted: Level of Service

## 2014-09-25 NOTE — Assessment & Plan Note (Signed)
Taking her aspirin daily, on statin, BP controlled. No carotid stenosis, no atrial fibrillation or cardiac deformity found on echo. She is not a smoker. Sugars have been well controlled and will continue to monitor closely.

## 2014-09-25 NOTE — Assessment & Plan Note (Signed)
BP controlled today on atenolol and benicar. Will check labs at next vsit.

## 2014-09-25 NOTE — Assessment & Plan Note (Signed)
Eye changes still present which is affecting her vision some but she is working on adjusting.

## 2014-09-27 DIAGNOSIS — M797 Fibromyalgia: Secondary | ICD-10-CM

## 2014-09-27 DIAGNOSIS — G609 Hereditary and idiopathic neuropathy, unspecified: Secondary | ICD-10-CM

## 2014-09-27 DIAGNOSIS — I69352 Hemiplegia and hemiparesis following cerebral infarction affecting left dominant side: Secondary | ICD-10-CM | POA: Diagnosis not present

## 2014-09-27 DIAGNOSIS — D573 Sickle-cell trait: Secondary | ICD-10-CM

## 2014-09-27 DIAGNOSIS — I872 Venous insufficiency (chronic) (peripheral): Secondary | ICD-10-CM

## 2014-09-27 DIAGNOSIS — E669 Obesity, unspecified: Secondary | ICD-10-CM

## 2014-09-27 DIAGNOSIS — E119 Type 2 diabetes mellitus without complications: Secondary | ICD-10-CM

## 2014-09-27 DIAGNOSIS — H53462 Homonymous bilateral field defects, left side: Secondary | ICD-10-CM | POA: Diagnosis not present

## 2014-09-27 DIAGNOSIS — I10 Essential (primary) hypertension: Secondary | ICD-10-CM | POA: Diagnosis not present

## 2014-09-27 DIAGNOSIS — I69398 Other sequelae of cerebral infarction: Secondary | ICD-10-CM | POA: Diagnosis not present

## 2014-09-30 ENCOUNTER — Telehealth: Payer: Self-pay | Admitting: Internal Medicine

## 2014-09-30 MED ORDER — METFORMIN HCL 500 MG PO TABS: 500.0000 mg | ORAL_TABLET | Freq: Two times a day (BID) | ORAL | Status: DC

## 2014-09-30 NOTE — Telephone Encounter (Signed)
Patient will start Metformin twice daily and stop glimepiride after 3 days.

## 2014-09-30 NOTE — Telephone Encounter (Signed)
Patient blood sugar has been high for the last 3 mornings. Was 319 this morning

## 2014-09-30 NOTE — Telephone Encounter (Signed)
Would like her to go back to taking metformin 500 mg twice a day. She can stop the glimepiride once she has been on the metformin for 3 days. Be sure to avoid sugary beverages or sweets. Sent in rx.

## 2014-09-30 NOTE — Telephone Encounter (Signed)
Please advise, thanks.

## 2014-10-01 ENCOUNTER — Encounter: Payer: Self-pay | Admitting: Internal Medicine

## 2014-10-03 ENCOUNTER — Ambulatory Visit (INDEPENDENT_AMBULATORY_CARE_PROVIDER_SITE_OTHER): Payer: Medicare HMO | Admitting: *Deleted

## 2014-10-03 ENCOUNTER — Other Ambulatory Visit: Payer: Self-pay | Admitting: Pulmonary Disease

## 2014-10-03 DIAGNOSIS — I639 Cerebral infarction, unspecified: Secondary | ICD-10-CM | POA: Diagnosis not present

## 2014-10-04 NOTE — Progress Notes (Signed)
Loop recorder 

## 2014-10-07 ENCOUNTER — Telehealth: Payer: Self-pay | Admitting: *Deleted

## 2014-10-07 NOTE — Telephone Encounter (Signed)
Ebony Hail called about getting VO for extending ST for Kayla [name].  Approval given

## 2014-10-11 LAB — HM DIABETES EYE EXAM

## 2014-10-22 ENCOUNTER — Telehealth: Payer: Self-pay | Admitting: *Deleted

## 2014-10-22 ENCOUNTER — Inpatient Hospital Stay: Payer: Medicare HMO | Admitting: Physical Medicine & Rehabilitation

## 2014-10-22 NOTE — Telephone Encounter (Signed)
[facility] called to get verbal approval to extend HHPT for 2 weeks.  She is not 100% ready for outpt PT.  Approval given.

## 2014-10-27 ENCOUNTER — Other Ambulatory Visit: Payer: Self-pay | Admitting: Pulmonary Disease

## 2014-10-28 ENCOUNTER — Other Ambulatory Visit: Payer: Self-pay | Admitting: Geriatric Medicine

## 2014-10-28 ENCOUNTER — Telehealth: Payer: Self-pay | Admitting: Internal Medicine

## 2014-10-28 MED ORDER — METFORMIN HCL 500 MG PO TABS: 500.0000 mg | ORAL_TABLET | Freq: Two times a day (BID) | ORAL | Status: DC

## 2014-10-28 NOTE — Telephone Encounter (Signed)
Spoke with patient's daughter and sent prescription to pharmacy.

## 2014-10-28 NOTE — Telephone Encounter (Signed)
Patients daughter would like a call back in regards to getting another script for metformin for patient.

## 2014-10-30 ENCOUNTER — Encounter: Payer: Self-pay | Admitting: Pulmonary Disease

## 2014-11-01 ENCOUNTER — Ambulatory Visit (INDEPENDENT_AMBULATORY_CARE_PROVIDER_SITE_OTHER): Payer: Medicare HMO | Admitting: *Deleted

## 2014-11-01 DIAGNOSIS — I639 Cerebral infarction, unspecified: Secondary | ICD-10-CM | POA: Diagnosis not present

## 2014-11-04 ENCOUNTER — Encounter: Payer: Self-pay | Admitting: Internal Medicine

## 2014-11-05 LAB — CUP PACEART REMOTE DEVICE CHECK: MDC IDC SESS DTM: 20160517132256

## 2014-11-08 ENCOUNTER — Encounter: Payer: Medicare HMO | Attending: Physical Medicine & Rehabilitation

## 2014-11-08 ENCOUNTER — Inpatient Hospital Stay: Payer: Medicare HMO | Admitting: Physical Medicine & Rehabilitation

## 2014-11-08 NOTE — Progress Notes (Signed)
Loop recorder 

## 2014-11-09 ENCOUNTER — Other Ambulatory Visit: Payer: Self-pay | Admitting: Internal Medicine

## 2014-11-12 ENCOUNTER — Telehealth: Payer: Self-pay | Admitting: *Deleted

## 2014-11-12 DIAGNOSIS — H53462 Homonymous bilateral field defects, left side: Secondary | ICD-10-CM

## 2014-11-12 DIAGNOSIS — I63531 Cerebral infarction due to unspecified occlusion or stenosis of right posterior cerebral artery: Secondary | ICD-10-CM

## 2014-11-12 NOTE — Telephone Encounter (Signed)
Kayla [name] needs to be referred to outpt neuro rehab.  Order placed.

## 2014-11-20 LAB — CUP PACEART REMOTE DEVICE CHECK: Date Time Interrogation Session: 20160601185325

## 2014-11-21 ENCOUNTER — Encounter: Payer: Self-pay | Admitting: Internal Medicine

## 2014-11-27 ENCOUNTER — Telehealth: Payer: Self-pay | Admitting: Internal Medicine

## 2014-11-27 NOTE — Telephone Encounter (Signed)
Patient is calling regarding her referral to a physical therapist and they are needing prior authorization. She is also wondering if she can stop taking metFORMIN (GLUCOPHAGE) 500 MG tablet [[id number]] and take glimepiride (AMARYL) 1 MG tablet [[id number] instead. Please advise patient

## 2014-11-28 NOTE — Telephone Encounter (Signed)
Please schedule office visit, per Dr. Doug Sou.

## 2014-11-28 NOTE — Telephone Encounter (Signed)
Appt. Scheduled.

## 2014-11-28 NOTE — Telephone Encounter (Signed)
She is about due for visit so please schedule so we can discuss. She has been taking the glimepiride and not metformin since April (last we discussed at visit).

## 2014-11-29 ENCOUNTER — Encounter: Payer: Self-pay | Admitting: Neurology

## 2014-11-29 ENCOUNTER — Ambulatory Visit (INDEPENDENT_AMBULATORY_CARE_PROVIDER_SITE_OTHER): Payer: Medicare HMO | Admitting: Neurology

## 2014-11-29 VITALS — BP 123/77 | HR 84 | Ht 64.5 in | Wt 190.0 lb

## 2014-11-29 DIAGNOSIS — I6340 Cerebral infarction due to embolism of unspecified cerebral artery: Secondary | ICD-10-CM | POA: Diagnosis not present

## 2014-11-29 NOTE — Patient Instructions (Signed)
I had a long d/w patient and grand daughter about her recent stroke, risk for recurrent stroke/TIAs, personally independently reviewed imaging studies and stroke evaluation results and answered questions.Continue aspirin 325 mg orally every day  for secondary stroke prevention and maintain strict control of hypertension with blood pressure goal below 130/90, diabetes with hemoglobin A1c goal below 6.5% and lipids with LDL cholesterol goal below 100 mg/dL. I also advised the patient to eat a healthy diet with plenty of whole grains, cereals, fruits and vegetables, exercise regularly and maintain ideal body weight Followup in the future with me in 6 months or call earlier if necessary. We'll check repeat carotid ultrasound and transcranial Dopplers at that visit. Consider possible participation in the [place] trial if interested. She was given written information to review at home and call us if interested. Stroke Prevention Some medical conditions and behaviors are associated with an increased chance of having a stroke. You may prevent a stroke by making healthy choices and managing medical conditions. HOW CAN I REDUCE MY RISK OF HAVING A STROKE?   Stay physically active. Get at least 30 minutes of activity on most or all days.  Do not smoke. It may also be helpful to avoid exposure to secondhand smoke.  Limit alcohol use. Moderate alcohol use is considered to be:  No more than 2 drinks per day for men.  No more than 1 drink per day for nonpregnant women.  Eat healthy foods. This involves:  Eating 5 or more servings of fruits and vegetables a day.  Making dietary changes that address high blood pressure (hypertension), high cholesterol, diabetes, or obesity.  Manage your cholesterol levels.  Making food choices that are high in fiber and low in saturated fat, trans fat, and cholesterol may control cholesterol levels.  Take any prescribed medicines to control cholesterol as directed by  your health care provider.  Manage your diabetes.  Controlling your carbohydrate and sugar intake is recommended to manage diabetes.  Take any prescribed medicines to control diabetes as directed by your health care provider.  Control your hypertension.  Making food choices that are low in salt (sodium), saturated fat, trans fat, and cholesterol is recommended to manage hypertension.  Take any prescribed medicines to control hypertension as directed by your health care provider.  Maintain a healthy weight.  Reducing calorie intake and making food choices that are low in sodium, saturated fat, trans fat, and cholesterol are recommended to manage weight.  Stop drug abuse.  Avoid taking birth control pills.  Talk to your health care provider about the risks of taking birth control pills if you are over 38 years old, smoke, get migraines, or have ever had a blood clot.  Get evaluated for sleep disorders (sleep apnea).  Talk to your health care provider about getting a sleep evaluation if you snore a lot or have excessive sleepiness.  Take medicines only as directed by your health care provider.  For some people, aspirin or blood thinners (anticoagulants) are helpful in reducing the risk of forming abnormal blood clots that can lead to stroke. If you have the irregular heart rhythm of atrial fibrillation, you should be on a blood thinner unless there is a good reason you cannot take them.  Understand all your medicine instructions.  Make sure that other conditions (such as anemia or atherosclerosis) are addressed. SEEK IMMEDIATE MEDICAL CARE IF:   You have sudden weakness or numbness of the face, arm, or leg, especially on one side of  the body.  Your face or eyelid droops to one side.  You have sudden confusion.  You have trouble speaking (aphasia) or understanding.  You have sudden trouble seeing in one or both eyes.  You have sudden trouble walking.  You have  dizziness.  You have a loss of balance or coordination.  You have a sudden, severe headache with no known cause.  You have new chest pain or an irregular heartbeat. Any of these symptoms may represent a serious problem that is an emergency. Do not wait to see if the symptoms will go away. Get medical help at once. Call your local emergency services (911 in [place] Do not drive yourself to the hospital. Document Released: 07/15/2004 Document Revised: 10/22/2013 Document Reviewed: 12/08/2012 [facility] Patient Information 2015 [place], [place]. This information is not intended to replace advice given to you by your health care provider. Make sure you discuss any questions you have with your health care provider.

## 2014-11-29 NOTE — Progress Notes (Signed)
[facility] [address] [place]. [place] [address] [phone]       OFFICE FOLLOW-UP NOTE  Ms. Kayla [name] Date of Birth:  03-27-1941 Medical Record Number:  [id number]   HPI: 5 year pleasant African-American lady seen today for the first office follow-up visit following hospital admission for stroke on 08/19/14. She presented with mental status changes with confusion as well as bumping into objects while walking and drooping of the left lower face. CT scan of the head showed an ill-defined hypodensity in the right parietal and occipital region and MRI scan subsequently confirmed acute infarcts in the right occipital, posterior inferior temporal lobes as well as the right anterior frontal lobe. MRA of the brain showed moderate P2 segment occlusion on the right and right M1 and proximal M2 high-grade stenosis. NIH stroke scale on admission was 7. Patient presented beyond time window for TPA or intervention. To lipid profile showed elevated LDL cholesterol of 110, hemoglobin A1c was elevated at 7.8. Transthoracic echo showed normal ejection fraction. TEE showed no cardiac source of embolism, PFO. Carotid ultrasound showed no significant extracranial stenosis. Patient had loop recorder implanted and so far paroxysmal A. fib has not been discovered. She was started on aspirin which is tolerating well without bleeding or bruising. She was also started on Lipitor which is tolerating well as well as. She states her left-sided weakness seems to have improved she has only mild weakness and clumsiness in the left hand however her left-sided peripheral vision loss has not improved. She still has to be careful while walking in order not to bump into objects. She has finished home physical and occupational therapy but has not been referred to outpatient therapies yet. She has not been driving. Her blood pressure is well controlled and is 123/77 today.  ROS:   14 system review of systems is  positive for  fatigue, activity change, hearing loss, loss of vision, shortness of breath, leg swelling, constipation, snoring, food allergies, walking difficulty, itching, memory loss, anemia and all other systems negative  PMH:  Past Medical History  Diagnosis Date  . Asthmatic bronchitis   . Hypertension   . Venous insufficiency   . Diabetes mellitus   . Hypercholesteremia   . Diverticulosis of colon   . Constipation   . Prolapse of vaginal vault after hysterectomy   . Proteinuria   . DJD (degenerative joint disease)   . Fibromyalgia   . Somatic dysfunction   . Anxiety   . Personal history of noncompliance with medical treatment, presenting hazards to health   . Sickle-cell trait   . Allergy     Shell fish  . GERD (gastroesophageal reflux disease)     Social History:  History   Social History  . Marital Status: Married    Spouse Name: N/A  . Number of Children: 4  . Years of Education: 13   Occupational History  . [employer]     retired   Social History Main Topics  . Smoking status: Never Smoker   . Smokeless tobacco: Never Used  . Alcohol Use: Yes     Comment: rarely wine  . Drug Use: No  . Sexual Activity: No   Other Topics Concern  . Not on file   Social History Narrative   Married   Right handed   Caffeine use - 1 cup coffee daily    Medications:   Current Outpatient Prescriptions on File Prior to Visit  Medication Sig Dispense Refill  .  aspirin 325 MG tablet Take 1 tablet (325 mg total) by mouth daily. 30 tablet 2  . atenolol (TENORMIN) 50 MG tablet Take 1 tablet (50 mg total) by mouth daily. 30 tablet 6  . atorvastatin (LIPITOR) 20 MG tablet Take 1 tablet (20 mg total) by mouth daily at 6 PM. 30 tablet 1  . BENICAR HCT 40-25 MG per tablet Take 1 tablet by mouth daily. 30 tablet 3  . Blood Glucose Monitoring Suppl (ACCU-CHEK AVIVA PLUS) W/DEVICE KIT Test blood sugar once daily 1 kit 0  . glucose blood (ACCU-CHEK AVIVA) test strip Check blood  sugar once daily 100 each 4  . metFORMIN (GLUCOPHAGE) 500 MG tablet Take 1 tablet (500 mg total) by mouth 2 (two) times daily with a meal. 60 tablet 3  . polyethylene glycol (MIRALAX / GLYCOLAX) packet Take 17 g by mouth daily. 14 each 0  . senna-docusate (SENOKOT-S) 8.6-50 MG per tablet Take 1 tablet by mouth 2 (two) times daily. 30 tablet 0   No current facility-administered medications on file prior to visit.    Allergies:   Allergies  Allergen Reactions  . Fish Allergy Hives  . Shellfish Allergy Hives  . Penicillins     REACTION: red rash  . Peanuts [Peanut Oil] Rash  . Tomato Rash    Physical Exam General: well developed, well nourished, seated, in no evident distress Head: head normocephalic and atraumatic.  Neck: supple with no carotid or supraclavicular bruits Cardiovascular: regular rate and rhythm, no murmurs Musculoskeletal: no deformity Skin:  no rash/petichiae Vascular:  Normal pulses all extremities Filed Vitals:   11/29/14 1015  BP: 123/77  Pulse: 84   Neurologic Exam Mental Status: Awake and fully alert. Oriented to place and time. Recent and remote memory intact. Attention span, concentration and fund of knowledge appropriate. Mood and affect appropriate.  Cranial Nerves: Fundoscopic exam reveals sharp disc margins. Pupils equal, briskly reactive to light. Extraocular movements full without nystagmus. Visual fields show dense left home when he must hemianopsia to confrontation. Hearing intact. Facial sensation intact. Mild left lower face weakness., Tongue, palate moves normally and symmetrically.  Motor: Normal bulk and tone. Normal strength in all tested extremity muscles except mild left grip weakness and diminished fine finger movements on the left. Orbits right over left approximately.. Tone slightly increased in the left leg. Sensory.: intact to touch ,pinprick .position and vibratory sensation.  Coordination: Rapid alternating movements normal in all  extremities. Finger-to-nose slightly impaired on the left and heel-to-shin performed accurately bilaterally. Gait and Station: Arises from chair without difficulty. Stance is normal. Gait demonstrates normal stride length and balance  but slight dragging of the left leg. Able to heel, toe and tandem walk with mild  difficulty.  Reflexes: 1+ and symmetric. Toes downgoing.   NIHSS  4 Modified Rankin 2   ASSESSMENT: 74 year African-American lady with embolic right PCA and MCA infarcts in February 2016 with undetermined source. Vascular risk factors of diabetes, hypertension, hyperlipidemia      PLAN: I had a long d/w patient and grand daughter about her recent stroke, risk for recurrent stroke/TIAs, personally independently reviewed imaging studies and stroke evaluation results and answered questions.Continue aspirin 325 mg orally every day  for secondary stroke prevention and maintain strict control of hypertension with blood pressure goal below 130/90, diabetes with hemoglobin A1c goal below 6.5% and lipids with LDL cholesterol goal below 100 mg/dL. I also advised the patient to eat a healthy diet with plenty of whole grains,  cereals, fruits and vegetables, exercise regularly and maintain ideal body weight Followup in the future with me in 6 months or call earlier if necessary. We'll check repeat carotid ultrasound and transcranial Dopplers at that visit. Consider possible participation in the Dougherty trial if interested. She was given written information to review at home and call us if interested.I spent a total of 35 Minutesin face to face in clinical consultation, greater than 50% of which was counseling/coordinating care .  Antony Contras, MD  Note: This document was prepared with digital dictation and possible smart phrase technology. Any transcriptional errors that result from this process are unintentional

## 2014-12-02 ENCOUNTER — Encounter: Payer: Self-pay | Admitting: Internal Medicine

## 2014-12-02 ENCOUNTER — Ambulatory Visit (INDEPENDENT_AMBULATORY_CARE_PROVIDER_SITE_OTHER): Payer: Medicare HMO

## 2014-12-02 DIAGNOSIS — I10 Essential (primary) hypertension: Secondary | ICD-10-CM | POA: Diagnosis not present

## 2014-12-02 DIAGNOSIS — I639 Cerebral infarction, unspecified: Secondary | ICD-10-CM | POA: Diagnosis not present

## 2014-12-03 LAB — CUP PACEART REMOTE DEVICE CHECK: Date Time Interrogation Session: 20160614100944

## 2014-12-03 NOTE — Progress Notes (Signed)
Loop recorder 

## 2014-12-05 ENCOUNTER — Ambulatory Visit (INDEPENDENT_AMBULATORY_CARE_PROVIDER_SITE_OTHER): Payer: Medicare HMO | Admitting: Internal Medicine

## 2014-12-05 ENCOUNTER — Other Ambulatory Visit (INDEPENDENT_AMBULATORY_CARE_PROVIDER_SITE_OTHER): Payer: Medicare HMO

## 2014-12-05 ENCOUNTER — Encounter: Payer: Self-pay | Admitting: Internal Medicine

## 2014-12-05 VITALS — BP 116/60 | HR 73 | Temp 98.1°F | Resp 16 | Ht 65.0 in | Wt 190.4 lb

## 2014-12-05 DIAGNOSIS — I10 Essential (primary) hypertension: Secondary | ICD-10-CM | POA: Diagnosis not present

## 2014-12-05 DIAGNOSIS — E119 Type 2 diabetes mellitus without complications: Secondary | ICD-10-CM | POA: Diagnosis not present

## 2014-12-05 DIAGNOSIS — E1140 Type 2 diabetes mellitus with diabetic neuropathy, unspecified: Secondary | ICD-10-CM | POA: Diagnosis not present

## 2014-12-05 DIAGNOSIS — N179 Acute kidney failure, unspecified: Secondary | ICD-10-CM

## 2014-12-05 DIAGNOSIS — E7800 Pure hypercholesterolemia, unspecified: Secondary | ICD-10-CM

## 2014-12-05 DIAGNOSIS — H53462 Homonymous bilateral field defects, left side: Secondary | ICD-10-CM

## 2014-12-05 LAB — COMPREHENSIVE METABOLIC PANEL
ALBUMIN: 4.2 g/dL (ref 3.5–5.2)
ALT: 15 U/L (ref 0–35)
AST: 14 U/L (ref 0–37)
Alkaline Phosphatase: 76 U/L (ref 39–117)
BUN: 19 mg/dL (ref 6–23)
CALCIUM: 9.5 mg/dL (ref 8.4–10.5)
CHLORIDE: 101 meq/L (ref 96–112)
CO2: 30 mEq/L (ref 19–32)
Creatinine, Ser: 1.2 mg/dL (ref 0.40–1.20)
GFR: 56.3 mL/min — ABNORMAL LOW (ref >60.00)
Glucose, Bld: 202 mg/dL — ABNORMAL HIGH (ref 70–99)
Potassium: 4.3 mEq/L (ref 3.5–5.1)
SODIUM: 136 meq/L (ref 135–145)
Total Bilirubin: 0.4 mg/dL (ref 0.2–1.2)
Total Protein: 7.6 g/dL (ref 6.0–8.3)

## 2014-12-05 LAB — HEMOGLOBIN A1C: HEMOGLOBIN A1C: 8.1 % — AB (ref 4.6–6.5)

## 2014-12-05 NOTE — Assessment & Plan Note (Signed)
BP controlled on benicar/hctz and atenolol. Checking BMP today.

## 2014-12-05 NOTE — Progress Notes (Signed)
   Subjective:    Patient ID: Kayla [name], female    DOB: November 20, 1940, 74 y.o.   MRN: [id number]  HPI The patient is a 74 YO female who is coming in to follow up on her blood pressure and her diabetes. She is still recovering from a stroke in March and is dealing with some decrease in her vision (likely permanent). She has been taking her medications regularly and denies any side effects from them. No hypoglycemia. She denies any new stroke-like symptoms. No dizziness or lightheadedness. No new complaints. She did have an NP come to her house and tell her that her ears were clogged with wax and she is struggling with her hearing for some time now. Decreased hearing overall and has to watch mouths for cues with conversation.   Review of Systems  Constitutional: Negative for fever, activity change, appetite change, fatigue and unexpected weight change.  HENT: Positive for dental problem and hearing loss.   Respiratory: Negative for chest tightness and shortness of breath.   Cardiovascular: Negative for chest pain, palpitations and leg swelling.  Gastrointestinal: Negative.   Musculoskeletal: Negative.   Skin: Negative.   Neurological: Positive for weakness and numbness. Negative for dizziness, seizures, syncope, facial asymmetry, light-headedness and headaches.       Some change in sensation strength from right to left      Objective:   Physical Exam  Constitutional: She is oriented to person, place, and time. She appears well-developed and well-nourished.  HENT:  Head: Normocephalic and atraumatic.  Eyes: EOM are normal.  Neck: Normal range of motion.  Cardiovascular: Normal rate and regular rhythm.   Pulmonary/Chest: Effort normal.  Abdominal: Soft.  Neurological: She is alert and oriented to person, place, and time. Coordination abnormal.  Skin: Skin is warm and dry.   Filed Vitals:   12/05/14 0950  BP: 116/60  Pulse: 73  Temp: 98.1 F (36.7 C)  TempSrc: Oral  Resp: 16    Height: 5\' 5"  (1.651 m)  Weight: 190 lb 6.4 oz (86.365 kg)  SpO2: 95%      Assessment & Plan:

## 2014-12-05 NOTE — Assessment & Plan Note (Signed)
Still present and talked to them about the fact that this is likely a permanent change in the brain. She is still following with ophto.

## 2014-12-05 NOTE — Progress Notes (Signed)
Pre visit review using our clinic review tool, if applicable. No additional management support is needed unless otherwise documented below in the visit note. 

## 2014-12-05 NOTE — Assessment & Plan Note (Signed)
Diabetic neuropathy stable today and does not require medication, still has. Continue metformin 500 mg BID for the diabetes and checking HgA1c today. She was questioning whether the metformin could be causing gum drainage and I looked up and not listed as a common or rare side effect. Advised her to follow with a dentist.

## 2014-12-05 NOTE — Assessment & Plan Note (Signed)
Most recent lipid panel at goal on lipitor 20 mg daily. No side effects. Checking LFTs today.

## 2014-12-05 NOTE — Patient Instructions (Signed)
We have cleaned out the ears today and are checking the blood work.   I am not sure that the metformin is related to the gum drainage and it may be worthwhile to have a dentist look at them.   Come back in about 6 months for a check up.   Diabetes and Standards of Medical Care Diabetes is complicated. You may find that your diabetes team includes a dietitian, nurse, diabetes educator, eye doctor, and more. To help everyone know what is going on and to help you get the care you deserve, the following schedule of care was developed to help keep you on track. Below are the tests, exams, vaccines, medicines, education, and plans you will need. HbA1c test This test shows how well you have controlled your glucose over the past 2-3 months. It is used to see if your diabetes management plan needs to be adjusted.   It is performed at least 2 times a year if you are meeting treatment goals.  It is performed 4 times a year if therapy has changed or if you are not meeting treatment goals. Blood pressure test  This test is performed at every routine medical visit. The goal is less than 140/90 mm Hg for most people, but 130/80 mm Hg in some cases. Ask your health care provider about your goal. Dental exam  Follow up with the dentist regularly. Eye exam  If you are diagnosed with type 1 diabetes as a child, get an exam upon reaching the age of 25 years or older and have had diabetes for 3-5 years. Yearly eye exams are recommended after that initial eye exam.  If you are diagnosed with type 1 diabetes as an adult, get an exam within 5 years of diagnosis and then yearly.  If you are diagnosed with type 2 diabetes, get an exam as soon as possible after the diagnosis and then yearly. Foot care exam  Visual foot exams are performed at every routine medical visit. The exams check for cuts, injuries, or other problems with the feet.  A comprehensive foot exam should be done yearly. This includes visual  inspection as well as assessing foot pulses and testing for loss of sensation.  Check your feet nightly for cuts, injuries, or other problems with your feet. Tell your health care provider if anything is not healing. Kidney function test (urine microalbumin)  This test is performed once a year.  Type 1 diabetes: The first test is performed 5 years after diagnosis.  Type 2 diabetes: The first test is performed at the time of diagnosis.  A serum creatinine and estimated glomerular filtration rate (eGFR) test is done once a year to assess the level of chronic kidney disease (CKD), if present. Lipid profile (cholesterol, HDL, LDL, triglycerides)  Performed every 5 years for most people.  The goal for LDL is less than 100 mg/dL. If you are at high risk, the goal is less than 70 mg/dL.  The goal for HDL is 40 mg/dL-50 mg/dL for men and 50 mg/dL-60 mg/dL for women. An HDL cholesterol of 60 mg/dL or higher gives some protection against heart disease.  The goal for triglycerides is less than 150 mg/dL. Influenza vaccine, pneumococcal vaccine, and hepatitis B vaccine  The influenza vaccine is recommended yearly.  It is recommended that people with diabetes who are over 43 years old get the pneumonia vaccine. In some cases, two separate shots may be given. Ask your health care provider if your pneumonia vaccination is  up to date.  The hepatitis B vaccine is also recommended for adults with diabetes. Diabetes self-management education  Education is recommended at diagnosis and ongoing as needed. Treatment plan  Your treatment plan is reviewed at every medical visit. Document Released: 04/04/2009 Document Revised: 10/22/2013 Document Reviewed: 11/07/2012 [facility] Patient Information 2015 [place], [place]. This information is not intended to replace advice given to you by your health care provider. Make sure you discuss any questions you have with your health care provider.

## 2014-12-05 NOTE — Assessment & Plan Note (Signed)
Recheck BMP today.  

## 2014-12-09 ENCOUNTER — Telehealth: Payer: Self-pay | Admitting: Internal Medicine

## 2014-12-09 NOTE — Telephone Encounter (Signed)
Patient is returning your call. She wants to know how close she is to having to be on insulin

## 2014-12-10 ENCOUNTER — Ambulatory Visit: Payer: Medicare HMO | Admitting: Internal Medicine

## 2014-12-17 ENCOUNTER — Encounter: Payer: Self-pay | Admitting: Cardiology

## 2014-12-17 ENCOUNTER — Encounter: Payer: Self-pay | Admitting: Internal Medicine

## 2014-12-26 ENCOUNTER — Encounter: Payer: Self-pay | Admitting: Cardiology

## 2015-01-01 ENCOUNTER — Encounter: Payer: Self-pay | Admitting: Cardiology

## 2015-01-09 ENCOUNTER — Telehealth: Payer: Self-pay | Admitting: Internal Medicine

## 2015-01-09 NOTE — Telephone Encounter (Signed)
Spoke with patient's daughter and gave her the lab results. She was asking about tests to check the heart and I explained that her cardiologist could do that for her. The last tests we ran checked liver and kidney function and blood sugar.

## 2015-01-09 NOTE — Telephone Encounter (Signed)
Patient is calling for results of lab work.

## 2015-01-09 NOTE — Telephone Encounter (Signed)
New Message  1. Are you calling to see if we received your device transmission? Yes   Pt daughter says the patient had the appointment Dr. Lyla Son in the hospital..She states that the EKG came back a "little Funky"

## 2015-01-13 NOTE — Telephone Encounter (Signed)
LMOM- we did receive remote transmission. Advised to call back if she has further questions.

## 2015-01-14 ENCOUNTER — Encounter (HOSPITAL_COMMUNITY)
Admission: RE | Disposition: A | Discharge status: Home / Self Care | Payer: Self-pay | Source: Ambulatory Visit | Attending: Cardiology

## 2015-01-14 ENCOUNTER — Ambulatory Visit (HOSPITAL_COMMUNITY)
Admission: RE | Admit: 2015-01-14 | Discharge: 2015-01-15 | Disposition: A | Discharge status: Home / Self Care | Payer: Medicare HMO | Source: Ambulatory Visit | Attending: Cardiology | Admitting: Cardiology

## 2015-01-14 ENCOUNTER — Encounter (HOSPITAL_COMMUNITY): Payer: Self-pay | Admitting: Cardiology

## 2015-01-14 DIAGNOSIS — E780 Pure hypercholesterolemia: Secondary | ICD-10-CM | POA: Diagnosis not present

## 2015-01-14 DIAGNOSIS — I10 Essential (primary) hypertension: Secondary | ICD-10-CM | POA: Insufficient documentation

## 2015-01-14 DIAGNOSIS — I69354 Hemiplegia and hemiparesis following cerebral infarction affecting left non-dominant side: Secondary | ICD-10-CM | POA: Insufficient documentation

## 2015-01-14 DIAGNOSIS — F329 Major depressive disorder, single episode, unspecified: Secondary | ICD-10-CM | POA: Diagnosis not present

## 2015-01-14 DIAGNOSIS — E119 Type 2 diabetes mellitus without complications: Secondary | ICD-10-CM | POA: Insufficient documentation

## 2015-01-14 DIAGNOSIS — I25119 Atherosclerotic heart disease of native coronary artery with unspecified angina pectoris: Secondary | ICD-10-CM | POA: Diagnosis not present

## 2015-01-14 DIAGNOSIS — Z955 Presence of coronary angioplasty implant and graft: Secondary | ICD-10-CM

## 2015-01-14 DIAGNOSIS — R079 Chest pain, unspecified: Secondary | ICD-10-CM | POA: Diagnosis present

## 2015-01-14 DIAGNOSIS — I209 Angina pectoris, unspecified: Secondary | ICD-10-CM | POA: Diagnosis present

## 2015-01-14 HISTORY — DX: Depression, unspecified: F32.A

## 2015-01-14 HISTORY — DX: Atherosclerotic heart disease of native coronary artery without angina pectoris: I25.10

## 2015-01-14 HISTORY — DX: Unspecified osteoarthritis, unspecified site: M19.90

## 2015-01-14 HISTORY — DX: Blindness, one eye, unspecified eye: H54.40

## 2015-01-14 HISTORY — DX: Major depressive disorder, single episode, unspecified: F32.9

## 2015-01-14 HISTORY — DX: Cerebral infarction, unspecified: I63.9

## 2015-01-14 HISTORY — DX: Type 2 diabetes mellitus without complications: E11.9

## 2015-01-14 HISTORY — DX: Cardiac murmur, unspecified: R01.1

## 2015-01-14 HISTORY — DX: Personal history of other diseases of the musculoskeletal system and connective tissue: Z87.39

## 2015-01-14 HISTORY — PX: CARDIAC CATHETERIZATION: SHX172

## 2015-01-14 LAB — GLUCOSE, CAPILLARY
GLUCOSE-CAPILLARY: 157 mg/dL — AB (ref 65–99)
GLUCOSE-CAPILLARY: 324 mg/dL — AB (ref 65–99)
GLUCOSE-CAPILLARY: 214 mg/dL — AB (ref 65–99)
Glucose-Capillary: 117 mg/dL — ABNORMAL HIGH (ref 65–99)

## 2015-01-14 LAB — POCT ACTIVATED CLOTTING TIME: Activated Clotting Time: 417 seconds

## 2015-01-14 SURGERY — LEFT HEART CATH AND CORONARY ANGIOGRAPHY

## 2015-01-14 MED ORDER — CLOPIDOGREL BISULFATE 75 MG PO TABS: 75.0000 mg | ORAL_TABLET | Freq: Every day | ORAL | Status: DC

## 2015-01-14 MED ORDER — CLOPIDOGREL BISULFATE 300 MG PO TABS
ORAL_TABLET | ORAL | Status: DC | PRN
  Administered 2015-01-14: 300 mg via ORAL

## 2015-01-14 MED ORDER — MORPHINE SULFATE 2 MG/ML IJ SOLN: 2.0000 mg | INTRAMUSCULAR | Status: DC | PRN

## 2015-01-14 MED ORDER — METHYLPREDNISOLONE SODIUM SUCC 125 MG IJ SOLR
INTRAMUSCULAR | Status: DC | PRN
  Administered 2015-01-14: 125 mg via INTRAVENOUS

## 2015-01-14 MED ORDER — DIPHENHYDRAMINE HCL 50 MG/ML IJ SOLN
INTRAMUSCULAR | Status: DC | PRN
  Administered 2015-01-14: 25 mg via INTRAVENOUS

## 2015-01-14 MED ORDER — SODIUM CHLORIDE 0.9 % IV SOLN: 250.0000 mL | INTRAVENOUS | Status: DC | PRN

## 2015-01-14 MED ORDER — SODIUM CHLORIDE 0.9 % IJ SOLN: 3.0000 mL | INTRAMUSCULAR | Status: DC | PRN

## 2015-01-14 MED ORDER — NITROGLYCERIN IN D5W 200-5 MCG/ML-% IV SOLN
INTRAVENOUS | Status: AC
  Filled 2015-01-14: qty 250

## 2015-01-14 MED ORDER — LIDOCAINE HCL (PF) 1 % IJ SOLN
INTRAMUSCULAR | Status: DC | PRN
  Administered 2015-01-14: 20 mL

## 2015-01-14 MED ORDER — SODIUM CHLORIDE 0.9 % IJ SOLN: 3.0000 mL | Freq: Two times a day (BID) | INTRAMUSCULAR | Status: DC

## 2015-01-14 MED ORDER — FENTANYL CITRATE (PF) 100 MCG/2ML IJ SOLN
INTRAMUSCULAR | Status: DC | PRN
  Administered 2015-01-14: 25 ug via INTRAVENOUS

## 2015-01-14 MED ORDER — ATORVASTATIN CALCIUM 20 MG PO TABS
20.0000 mg | ORAL_TABLET | Freq: Every day | ORAL | Status: DC
  Administered 2015-01-14: 20 mg via ORAL
  Filled 2015-01-14 (×2): qty 1

## 2015-01-14 MED ORDER — SODIUM CHLORIDE 0.9 % IJ SOLN
3.0000 mL | Freq: Two times a day (BID) | INTRAMUSCULAR | Status: DC
  Administered 2015-01-14: 22:00:00 3 mL via INTRAVENOUS

## 2015-01-14 MED ORDER — MIDAZOLAM HCL 2 MG/2ML IJ SOLN
INTRAMUSCULAR | Status: AC
  Filled 2015-01-14: qty 2

## 2015-01-14 MED ORDER — SODIUM CHLORIDE 0.9 % WEIGHT BASED INFUSION
3.0000 mL/kg/h | INTRAVENOUS | Status: AC
  Administered 2015-01-14: 3 mL/kg/h via INTRAVENOUS

## 2015-01-14 MED ORDER — FENTANYL CITRATE (PF) 100 MCG/2ML IJ SOLN
INTRAMUSCULAR | Status: AC
  Filled 2015-01-14: qty 2

## 2015-01-14 MED ORDER — SODIUM CHLORIDE 0.9 % IV SOLN
INTRAVENOUS | Status: DC | PRN
  Administered 2015-01-14: 150 mL/h via INTRAVENOUS

## 2015-01-14 MED ORDER — DIPHENHYDRAMINE HCL 50 MG/ML IJ SOLN
INTRAMUSCULAR | Status: AC
  Filled 2015-01-14: qty 1

## 2015-01-14 MED ORDER — INSULIN ASPART 100 UNIT/ML ~~LOC~~ SOLN
0.0000 [IU] | Freq: Three times a day (TID) | SUBCUTANEOUS | Status: DC
  Administered 2015-01-14: 18:00:00 7 [IU] via SUBCUTANEOUS
  Administered 2015-01-15: 1 [IU] via SUBCUTANEOUS

## 2015-01-14 MED ORDER — NITROGLYCERIN 1 MG/10 ML FOR IR/CATH LAB
INTRA_ARTERIAL | Status: AC
  Filled 2015-01-14: qty 10

## 2015-01-14 MED ORDER — BIVALIRUDIN 250 MG IV SOLR
INTRAVENOUS | Status: AC
  Filled 2015-01-14: qty 250

## 2015-01-14 MED ORDER — CLOPIDOGREL BISULFATE 300 MG PO TABS
ORAL_TABLET | ORAL | Status: AC
Start: 2015-01-14 — End: 2015-01-14
  Filled 2015-01-14: qty 1

## 2015-01-14 MED ORDER — CLOPIDOGREL BISULFATE 300 MG PO TABS
ORAL_TABLET | ORAL | Status: AC
  Filled 2015-01-14: qty 1

## 2015-01-14 MED ORDER — ONDANSETRON HCL 4 MG/2ML IJ SOLN: 4.0000 mg | Freq: Four times a day (QID) | INTRAMUSCULAR | Status: DC | PRN

## 2015-01-14 MED ORDER — ASPIRIN 81 MG PO CHEW
81.0000 mg | CHEWABLE_TABLET | ORAL | Status: AC
  Administered 2015-01-14: 81 mg via ORAL

## 2015-01-14 MED ORDER — CLOPIDOGREL BISULFATE 75 MG PO TABS
ORAL_TABLET | ORAL | Status: DC | PRN
  Administered 2015-01-14: 300 mg via ORAL

## 2015-01-14 MED ORDER — CLOPIDOGREL BISULFATE 75 MG PO TABS
75.0000 mg | ORAL_TABLET | Freq: Every day | ORAL | Status: DC
  Administered 2015-01-15: 75 mg via ORAL
  Filled 2015-01-14 (×2): qty 1

## 2015-01-14 MED ORDER — NITROGLYCERIN 1 MG/10 ML FOR IR/CATH LAB
INTRA_ARTERIAL | Status: DC | PRN
  Administered 2015-01-14 (×3): 150 ug via INTRACORONARY
  Administered 2015-01-14: 200 ug via INTRACORONARY
  Administered 2015-01-14: 150 ug via INTRACORONARY

## 2015-01-14 MED ORDER — NITROGLYCERIN IN D5W 200-5 MCG/ML-% IV SOLN
INTRAVENOUS | Status: DC | PRN
  Administered 2015-01-14: 10 ug/min via INTRAVENOUS

## 2015-01-14 MED ORDER — MIDAZOLAM HCL 2 MG/2ML IJ SOLN
INTRAMUSCULAR | Status: DC | PRN
  Administered 2015-01-14: 1 mg via INTRAVENOUS

## 2015-01-14 MED ORDER — PANTOPRAZOLE SODIUM 40 MG PO TBEC
40.0000 mg | DELAYED_RELEASE_TABLET | Freq: Every day | ORAL | Status: DC
  Administered 2015-01-14 – 2015-01-15 (×2): 40 mg via ORAL
  Filled 2015-01-14 (×2): qty 1

## 2015-01-14 MED ORDER — ASPIRIN 81 MG PO CHEW
CHEWABLE_TABLET | ORAL | Status: AC
  Administered 2015-01-14: 81 mg via ORAL
  Filled 2015-01-14: qty 1

## 2015-01-14 MED ORDER — LISINOPRIL 10 MG PO TABS
20.0000 mg | ORAL_TABLET | Freq: Every day | ORAL | Status: DC
  Administered 2015-01-14 – 2015-01-15 (×2): 20 mg via ORAL
  Filled 2015-01-14 (×3): qty 2

## 2015-01-14 MED ORDER — LIDOCAINE HCL (PF) 1 % IJ SOLN
INTRAMUSCULAR | Status: AC
  Filled 2015-01-14: qty 30

## 2015-01-14 MED ORDER — ACETAMINOPHEN 325 MG PO TABS
650.0000 mg | ORAL_TABLET | ORAL | Status: DC | PRN
  Administered 2015-01-14: 20:00:00 650 mg via ORAL
  Filled 2015-01-14: qty 2

## 2015-01-14 MED ORDER — FAMOTIDINE IN NACL 20-0.9 MG/50ML-% IV SOLN
INTRAVENOUS | Status: AC
  Filled 2015-01-14: qty 50

## 2015-01-14 MED ORDER — NITROGLYCERIN IN D5W 200-5 MCG/ML-% IV SOLN: 10.0000 ug/min | INTRAVENOUS | Status: DC

## 2015-01-14 MED ORDER — NITROGLYCERIN 0.4 MG SL SUBL: 0.4000 mg | SUBLINGUAL_TABLET | SUBLINGUAL | Status: DC | PRN

## 2015-01-14 MED ORDER — SODIUM CHLORIDE 0.9 % IV SOLN
INTRAVENOUS | Status: AC
  Administered 2015-01-14: 11:00:00 via INTRAVENOUS

## 2015-01-14 MED ORDER — SODIUM CHLORIDE 0.9 % IV SOLN
250.0000 mg | INTRAVENOUS | Status: DC | PRN
  Administered 2015-01-14: 1.75 mg/kg/h via INTRAVENOUS

## 2015-01-14 MED ORDER — METHYLPREDNISOLONE SODIUM SUCC 125 MG IJ SOLR
INTRAMUSCULAR | Status: AC
  Filled 2015-01-14: qty 2

## 2015-01-14 MED ORDER — ASPIRIN 81 MG PO CHEW
81.0000 mg | CHEWABLE_TABLET | Freq: Every day | ORAL | Status: DC
  Administered 2015-01-14 – 2015-01-15 (×2): 81 mg via ORAL
  Filled 2015-01-14 (×2): qty 1

## 2015-01-14 MED ORDER — FAMOTIDINE IN NACL 20-0.9 MG/50ML-% IV SOLN
INTRAVENOUS | Status: DC | PRN
  Administered 2015-01-14: 20 mg via INTRAVENOUS

## 2015-01-14 MED ORDER — HEPARIN (PORCINE) IN NACL 2-0.9 UNIT/ML-% IJ SOLN
INTRAMUSCULAR | Status: DC | PRN
  Administered 2015-01-14: 1500 mL

## 2015-01-14 MED ORDER — BIVALIRUDIN BOLUS VIA INFUSION - CUPID
INTRAVENOUS | Status: DC | PRN
  Administered 2015-01-14: 64.65 mg via INTRAVENOUS

## 2015-01-14 MED ORDER — IOHEXOL 350 MG/ML SOLN
INTRAVENOUS | Status: DC | PRN
  Administered 2015-01-14: 270 mL via INTRA_ARTERIAL

## 2015-01-14 MED ORDER — SODIUM CHLORIDE 0.9 % WEIGHT BASED INFUSION: 1.0000 mL/kg/h | INTRAVENOUS | Status: DC

## 2015-01-14 MED ORDER — METOPROLOL TARTRATE 25 MG PO TABS
50.0000 mg | ORAL_TABLET | Freq: Two times a day (BID) | ORAL | Status: DC
  Administered 2015-01-14 – 2015-01-15 (×3): 50 mg via ORAL
  Filled 2015-01-14 (×3): qty 2

## 2015-01-14 SURGICAL SUPPLY — 19 items
BALLN MINITREK RX 1.5X15 (BALLOONS) ×2
BALLN MINITREK RX 2.0X12 (BALLOONS) ×2
BALLN ~~LOC~~ TREK RX 2.75X20 (BALLOONS) ×2 IMPLANT
BALLOON MINITREK RX 1.5X15 (BALLOONS) ×1 IMPLANT
BALLOON MINITREK RX 2.0X12 (BALLOONS) ×1 IMPLANT
CATH INFINITI 5FR MULTPACK ANG (CATHETERS) ×2 IMPLANT
GUIDE CATH RUNWAY 6FR VL3.5 (CATHETERS) ×2 IMPLANT
KIT ENCORE 26 ADVANTAGE (KITS) ×2 IMPLANT
KIT HEART LEFT (KITS) ×2 IMPLANT
PACK CARDIAC CATHETERIZATION (CUSTOM PROCEDURE TRAY) ×2 IMPLANT
SHEATH PINNACLE 5F 10CM (SHEATH) ×2 IMPLANT
SHEATH PINNACLE 6F 10CM (SHEATH) ×2 IMPLANT
STENT XIENCE ALPINE RX 2.25X23 (Permanent Stent) ×2 IMPLANT
STENT XIENCE ALPINE RX 2.75X38 (Permanent Stent) ×2 IMPLANT
SYR MEDRAD MARK V 150ML (SYRINGE) ×2 IMPLANT
TRANSDUCER W/STOPCOCK (MISCELLANEOUS) ×2 IMPLANT
TUBING CIL FLEX 10 FLL-RA (TUBING) ×2 IMPLANT
WIRE EMERALD 3MM-J .035X150CM (WIRE) ×2 IMPLANT
WIRE RUNTHROUGH .014X180CM (WIRE) ×2 IMPLANT

## 2015-01-14 NOTE — Progress Notes (Signed)
Site area: right groin  Site Prior to Removal:  Level 0  Pressure Applied For 20 MINUTES    Minutes Beginning at 1340  Manual:   Yes.    Patient Status During Pull:  stable  Post Pull Groin Site:  Level 0  Post Pull Instructions Given:  Yes.    Post Pull Pulses Present:  Yes.    Dressing Applied:  Yes.    Comments:

## 2015-01-14 NOTE — Progress Notes (Signed)
Subjective:  Doing well denies any chest pain or shortness of breath tolerated PCI to LAD and diagonal 1.  Objective:  Vital Signs in the last 24 hours: Temp:  [97.3 F (36.3 C)-98.1 F (36.7 C)] 97.5 F (36.4 C) (07/26 1534) Pulse Rate:  [0-84] 84 (07/26 1534) Resp:  [0-37] 28 (07/26 1600) BP: (114-215)/(62-111) 135/71 mmHg (07/26 1600) SpO2:  [0 %-100 %] 94 % (07/26 1600) Weight:  [86.183 kg (190 lb)] 86.183 kg (190 lb) (07/26 0713)  Intake/Output from previous day:   Intake/Output from this shift: Total I/O In: -  Out: 500 [Urine:500]  Physical Exam: Neck: no adenopathy, no carotid bruit, no JVD and supple, symmetrical, trachea midline Lungs: clear to auscultation bilaterally Heart: regular rate and rhythm, S1, S2 normal and Soft systolic murmur noted Abdomen: soft, non-tender; bowel sounds normal; no masses,  no organomegaly Extremities: extremities normal, atraumatic, no cyanosis or edema and Right groin dressing dry no hematoma  Lab Results: No results for input(s): WBC, HGB, PLT in the last 72 hours. No results for input(s): NA, K, CL, CO2, GLUCOSE, BUN, CREATININE in the last 72 hours. No results for input(s): TROPONINI in the last 72 hours.  Invalid input(s): CK, MB Hepatic Function Panel No results for input(s): PROT, ALBUMIN, AST, ALT, ALKPHOS, BILITOT, BILIDIR, IBILI in the last 72 hours. No results for input(s): CHOL in the last 72 hours. No results for input(s): PROTIME in the last 72 hours.  Imaging: Imaging results have been reviewed and No results found.  Cardiac Studies:  Assessment/Plan:  New-onset angina status post left cath/PTCA stenting to mid and distal LAD and proximal diagonal 1 with excellent angiographic results Hypertension Diabetes mellitus Hypercholesteremia History of right CVA Depression Plan Continue present management      Charolette Forward 01/14/2015, 5:28 PM

## 2015-01-14 NOTE — Interval H&P Note (Signed)
Cath Lab Visit (complete for each Cath Lab visit)  Clinical Evaluation Leading to the Procedure:   ACS: No.  Non-ACS:    Anginal Classification: CCS IV  Anti-ischemic medical therapy: Maximal Therapy (2 or more classes of medications)  Non-Invasive Test Results: No non-invasive testing performed  Prior CABG: No previous CABG      History and Physical Interval Note:  01/14/2015 8:30 AM  Kayla [name]  has presented today for surgery, with the diagnosis of Unstable Angina  The various methods of treatment have been discussed with the patient and family. After consideration of risks, benefits and other options for treatment, the patient has consented to  Procedure(s): Left Heart Cath and Coronary Angiography (N/A) as a surgical intervention .  The patient's history has been reviewed, patient examined, no change in status, stable for surgery.  I have reviewed the patient's chart and labs.  Questions were answered to the patient's satisfaction.     Charolette Forward

## 2015-01-14 NOTE — H&P (Signed)
Typed H&P in the chart needs to be scanned 

## 2015-01-15 ENCOUNTER — Telehealth: Payer: Self-pay

## 2015-01-15 DIAGNOSIS — I25119 Atherosclerotic heart disease of native coronary artery with unspecified angina pectoris: Secondary | ICD-10-CM | POA: Diagnosis not present

## 2015-01-15 DIAGNOSIS — E119 Type 2 diabetes mellitus without complications: Secondary | ICD-10-CM | POA: Diagnosis not present

## 2015-01-15 DIAGNOSIS — E780 Pure hypercholesterolemia: Secondary | ICD-10-CM | POA: Diagnosis not present

## 2015-01-15 DIAGNOSIS — I10 Essential (primary) hypertension: Secondary | ICD-10-CM | POA: Diagnosis not present

## 2015-01-15 LAB — BASIC METABOLIC PANEL WITH GFR
Anion gap: 9 (ref 5–15)
BUN: 20 mg/dL (ref 6–20)
CO2: 22 mmol/L (ref 22–32)
Calcium: 8.4 mg/dL — ABNORMAL LOW (ref 8.9–10.3)
Chloride: 104 mmol/L (ref 101–111)
Creatinine, Ser: 1.32 mg/dL — ABNORMAL HIGH (ref 0.44–1.00)
GFR calc Af Amer: 45 mL/min — ABNORMAL LOW
GFR calc non Af Amer: 38 mL/min — ABNORMAL LOW
Glucose, Bld: 166 mg/dL — ABNORMAL HIGH (ref 65–99)
Potassium: 3.8 mmol/L (ref 3.5–5.1)
Sodium: 135 mmol/L (ref 135–145)

## 2015-01-15 LAB — CBC
HCT: 37.1 % (ref 36.0–46.0)
Hemoglobin: 12.5 g/dL (ref 12.0–15.0)
MCH: 29.8 pg (ref 26.0–34.0)
MCHC: 33.7 g/dL (ref 30.0–36.0)
MCV: 88.3 fL (ref 78.0–100.0)
Platelets: 253 K/uL (ref 150–400)
RBC: 4.2 MIL/uL (ref 3.87–5.11)
RDW: 15.7 % — ABNORMAL HIGH (ref 11.5–15.5)
WBC: 7.1 K/uL (ref 4.0–10.5)

## 2015-01-15 LAB — GLUCOSE, CAPILLARY
GLUCOSE-CAPILLARY: 146 mg/dL — AB (ref 65–99)
Glucose-Capillary: 120 mg/dL — ABNORMAL HIGH (ref 65–99)

## 2015-01-15 MED ORDER — METFORMIN HCL 500 MG PO TABS: 500.0000 mg | ORAL_TABLET | Freq: Two times a day (BID) | ORAL | Status: DC

## 2015-01-15 NOTE — Progress Notes (Signed)
CARDIAC REHAB PHASE I   PRE:  Rate/Rhythm: 69 SR    BP: sitting 164/75    SaO2:   MODE:  Ambulation: 450 ft   POST:  Rate/Rhythm: 87 SR    BP: sitting 182/68     SaO2:   Pt sts she sometimes uses cane at home but not always. Walked without device, I used gait belt for security. No LOB. Pt walked quickly at first but got SOB/fatigued and needed rest, x3 total. Pt did slow her pace on return trip (husband sts she normally walks fast at first). Pt c/o right leg burning on return trip, which was a problem PTA. Ed completed with husband present. Both pt and husband seem slow to grasp full concepts but pt was able to perform teach back adequately. Interested in [place] and will send referral to [place]. Pt apparently was to do more outpatient PT but there was no f/u. [id number]   Josephina Shih [place] CES, ACSM 01/15/2015 9:27 AM

## 2015-01-15 NOTE — Progress Notes (Signed)
Inpatient Diabetes Program Recommendations  AACE/ADA: New Consensus Statement on Inpatient Glycemic Control (2013)  Target Ranges:  Prepandial:   less than 140 mg/dL      Peak postprandial:   less than 180 mg/dL (1-2 hours)      Critically ill patients:  140 - 180 mg/dL   Results for JAMITA, MCKELVIN (MRN [id number]) as of 01/15/2015 10:47  Ref. Range 01/14/2015 11:24 01/14/2015 17:15 01/14/2015 21:39 01/15/2015 06:18  Glucose-Capillary Latest Ref Range: 65-99 mg/dL 157 (H) 324 (H) 214 (H) 146 (H)   Reason for Admission: CP  Diabetes history: DM 2 Outpatient Diabetes medications: Metformin 500 mg BID Current orders for Inpatient glycemic control: Novolog sensitive scale (0-9 units) TID  Inpatient Diabetes Program Recommendations Correction (SSI): If patient is not discharged today, patient's glucose increased into the 200-300 range at meals. Please consider increasing correction scale to Novolog Moderate (0-15 units ) TID and adding Bedtime scale.  Thanks,  Tama Headings RN, MSN, [facility] Inpatient Diabetes Coordinator Team Pager [phone]

## 2015-01-15 NOTE — Discharge Summary (Signed)
Discharge summary dictated on 01/15/2015 dictation number is [phone]

## 2015-01-15 NOTE — Discharge Instructions (Signed)
Coronary Angiogram With Stent, Care After Refer to this sheet in the next few weeks. These instructions provide you with information on caring for yourself after your procedure. Your health care provider may also give you more specific instructions. Your treatment has been planned according to current medical practices, but problems sometimes occur. Call your health care provider if you have any problems or questions after your procedure.  WHAT TO EXPECT AFTER THE PROCEDURE  The insertion site may be tender for a few days after your procedure. HOME CARE INSTRUCTIONS   Take medicines only as directed by your health care provider. Blood thinners may be prescribed after your procedure to improve blood flow through the stent.  Change any bandages (dressings) as directed by your health care provider.   Check your insertion site every day for redness, swelling, or fluid leaking from the insertion.   Do not take baths, swim, or use a hot tub until your health care provider approves. You may shower. Pat the insertion area dry. Do not rub the insertion area with a washcloth or towel.   Eat a heart-healthy diet. This should include plenty of fresh fruits and vegetables. Meat should be lean cuts. Avoid the following types of food:   Food that is high in salt.   Canned or highly processed food.   Food that is high in saturated fat or sugar.   Fried food.   Make any other lifestyle changes recommended by your health care provider. This may include:   Not using any tobacco products including cigarettes, chewing tobacco, or electronic cigarettes.  Managing your weight.   Getting regular exercise.   Managing your blood pressure.   Limiting your alcohol intake.   Managing other health problems, such as diabetes.   If you need an MRI after your heart stent was placed, be sure to tell the health care provider who orders the MRI that you have a heart stent.   Keep all follow-up  visits as directed by your health care provider.  SEEK IMMEDIATE MEDICAL CARE IF:   You develop chest pain, shortness of breath, feel faint, or pass out.  You have bleeding, swelling larger than a walnut, or drainage from the catheter insertion site.  You develop pain, discoloration, coldness, or severe bruising in the leg or arm that held the catheter.  You develop bleeding from any other place such as from the bowels. There may be bright red blood in the urine or stools, or it may appear as black, tarry stools.  You have a fever or chills. MAKE SURE YOU:  Understand these instructions.  Will watch your condition.  Will get help right away if you are not doing well or get worse. Document Released: 12/25/2004 Document Revised: 10/22/2013 Document Reviewed: 11/08/2012 [facility] Patient Information 2015 [place], [place]. This information is not intended to replace advice given to you by your health care provider. Make sure you discuss any questions you have with your health care provider.  Angina Pectoris Angina pectoris is extreme discomfort in your chest, neck, or arm. Your doctor may call it just angina. It is caused by a lack of oxygen to your heart wall. It may feel like tightness or heavy pressure. It may feel like a crushing or squeezing pain. Some people say it feels like gas. It may go down your shoulders, back, and arms. Some people have symptoms other than pain. These include:  Tiredness.  Shortness of breath.  Cold sweats.  Feeling sick to your stomach (  nausea). There are four types of angina:  Stable angina. This type often lasts the same amount of time each time it happens. Activity, stress, or excitement can bring it on. It often gets better after taking a medicine called nitroglycerin. This goes under your tongue.  Unstable angina. This type can happen when you are not active or even during sleep. It can suddenly get worse or happen more often. It may not get better  after taking the special medicine. It can last up to 30 minutes.  Microvascular angina. This type is more common in women. It may be more severe or last longer than other types.  Prinzmetal angina. This type often happens when you are not active or in the early morning hours. HOME CARE   Only take medicines as told by your doctor.  Stay active or exercise more as told by your doctor.  Limit very hard activity as told by your doctor.  Limit heavy lifting as told by your doctor.  Keep a healthy weight.  Learn about and eat foods that are healthy for your heart.  Do not use any tobacco such as cigarettes, chewing tobacco, or e-cigarettes. GET HELP RIGHT AWAY IF:   You have chest, neck, deep shoulder, or arm pain or discomfort that lasts more than a few minutes.  You have chest, neck, deep shoulder, or arm pain or discomfort that goes away and comes back over and over again.  You have heavy sweating that seems to happen for no reason.  You have shortness of breath or trouble breathing.  Your angina does not get better after a few minutes of rest.  Your angina does not get better after you take nitroglycerin medicine. These can all be symptoms of a heart attack. Get help right away. Call your local emergency service (911 in [place] Do not  drive yourself to the hospital. Do not  wait to for your symptoms to go away. MAKE SURE YOU:   Understand these instructions.  Will watch your condition.  Will get help right away if you are not doing well or get worse. Document Released: 11/24/2007 Document Revised: 06/12/2013 Document Reviewed: 10/09/2013 [facility] Patient Information 2015 [place], [place]. This information is not intended to replace advice given to you by your health care provider. Make sure you discuss any questions you have with your health care provider.

## 2015-01-15 NOTE — Telephone Encounter (Signed)
Pt is on TCM call. Heart Cath procedure done. No dc summary to review.

## 2015-01-16 NOTE — Discharge Summary (Signed)
Kayla [name], Kayla [name]                ACCOUNT NO.:  [id number]  MEDICAL RECORD NO.:  [id number]  LOCATION:  6C05C                        FACILITY:  [facility]  PHYSICIAN:  Damari Hiltz N. Terrence Dupont, M.D. DATE OF BIRTH:  [birth year]  DATE OF ADMISSION:  01/14/2015 DATE OF DISCHARGE:  01/15/2015                              DISCHARGE SUMMARY   ADMITTING DIAGNOSES:  New onset angina.  Abnormal EKG, rule out coronary insufficiency.  Hypertension.  Diabetes mellitus.  Hypercholesteremia. History of recent right cerebrovascular accident with left paresis. Chronic depression.  DISCHARGE DIAGNOSES:  New onset angina/abnormal EKG, status post left cath/percutaneous transluminal coronary angioplasty stenting to left anterior descending artery and diagonal 1.  Hypertension.  Diabetes mellitus.  Hypercholesteremia.  History of right cerebrovascular accident with left paresis.  Chronic depression.  DISCHARGE MEDICATIONS:  Discharge home medications are aspirin 325 mg, 1 tablet daily; atorvastatin 25 mg, 1 tablet daily; lisinopril 20 mg daily; clopidogrel 75 mg daily; isosorbide mononitrate 30 mg daily; metformin 500 mg twice daily, starting 07/29; metoprolol 50 mg twice daily; Nitrostat sublingual p.r.n.; MiraLAX 17 g by mouth daily as before; __________ 1 tablet twice daily as needed.  DIET:  Low salt low cholesterol 1800 calories ADA diet.  The patient has been advised to monitor blood sugar daily.  The patient has been encouraged to drink plenty of fluids.  CONDITION AT DISCHARGE:  Stable.  Post cardiac cath/percutaneous transluminal coronary angioplasty stent instructions have been given.  The patient will be scheduled for phase 2 cardiac rehab as outpatient.  BRIEF HISTORY:  Kayla [name] is 74 year old female with past medical history significant for coronary artery disease, hypertension, diabetes mellitus, hypercholesteremia, history of cryptogenic possibly cardioembolic cerebrovascular accident  approximately 4 months ago, status post loop recorder, depression, came to office complaining of retrosternal chest tightness and left arm numbness associated with feeling weak, tired, fatigued, and no energy.  The patient also gives history of exertional dyspnea with minimal exertion.  EKG done in the office showed normal sinus rhythm with left ventricular hypertrophy with marked ST-T wave changes in anterolateral and inferior leads which were new as compared to prior EKG done few months ago in the hospital.  The patient states her blood pressure lately has been running high as she ran out of Benicar, which she could not afford.  The patient denies any weakness in the arms or legs.  Denies any slurred speech.  PHYSICAL EXAMINATION:  GENERAL:  On physical examination, she was alert, awake, oriented x3. VITAL SIGNS:  Blood pressure was 165/105, pulse was 96. HEENT:  Conjunctivae were pink. NECK:  Supple.  No jugular venous distention.  No bruit. LUNGS:  Lungs were clear to auscultation without rhonchi or rales. CARDIOVASCULAR:  S1, S2 was normal.  There was soft systolic murmur and S4 gallop. ABDOMEN:  Soft.  Bowel sounds were present.  Nontender. EXTREMITIES:  No clubbing, cyanosis, or edema.  LABORATORY DATA:  Her sodium was 135, potassium 3.8, BUN 20, creatinine 1.32, glucose was 146.  Hemoglobin 12.5, hematocrit 37.1, white count of 7.1.  DIAGNOSTIC DATA:  EKG showed left bundle-branch block.  BRIEF HOSPITAL COURSE:  The patient was a.m. admit and underwent left cardiac cath  with selective left and right coronary angiography, left ventriculography, and percutaneous transluminal coronary angioplasty stenting to mid and distal left anterior descending artery and proximal diagonal 1.  As per procedure report, the patient tolerated the procedure well.  There were no complications.  Postprocedure, the patient did not have any episodes of anginal chest pain.  Her groin is stable with  moderate area of ecchymosis but no evidence of hematoma. The patient ambulated in the hallway earlier today without any problems. The patient did not have any episodes of chest pain or tightness during the hospital stay.  The patient will be discharged home on above medications and will be followed up in my office in 1 week.     Allegra Lai. Terrence Dupont, M.D.     MNH/MEDQ  D:  01/15/2015  T:  01/15/2015  Job:  [id number]

## 2015-01-17 NOTE — Telephone Encounter (Signed)
TCM list Procedure: Stent placed.  DC summary is visible now.

## 2015-01-22 ENCOUNTER — Encounter: Payer: Self-pay | Admitting: Nurse Practitioner

## 2015-01-22 ENCOUNTER — Ambulatory Visit (INDEPENDENT_AMBULATORY_CARE_PROVIDER_SITE_OTHER): Payer: Medicare HMO | Admitting: Nurse Practitioner

## 2015-01-22 ENCOUNTER — Telehealth: Payer: Self-pay | Admitting: Internal Medicine

## 2015-01-22 VITALS — BP 140/90 | HR 82 | Ht 66.5 in | Wt 183.0 lb

## 2015-01-22 DIAGNOSIS — I471 Supraventricular tachycardia: Secondary | ICD-10-CM

## 2015-01-22 NOTE — Progress Notes (Signed)
Electrophysiology Office Note Date: 01/22/2015  ID:  Kayla [name], DOB 1940/09/07, MRN [id number]  PCP: Olga Millers, MD Primary Cardiologist: Terrence Dupont Electrophysiologist: Allred  CC: SVT identified on ILR  Kayla [name] is a 74 y.o. female seen today for Dr Rayann Heman.  She underwent ILR implant 08/2014 at time of cryptogenic stroke.  She since developed chest pain and underwent PTCA to LAD by Dr Terrence Dupont 12/2014. Marland KitchenRemote ILR interrogation demonstrated SVT and she presents today for further evaluation. Since last being seen in our clinic, the patient reports doing reasonably well.  She has occasional short lived palpitations and chronic dyspnea but denies chest pain, PND, orthopnea, nausea, vomiting, dizziness, syncope, edema, weight gain, or early satiety.  Past Medical History  Diagnosis Date  . Asthmatic bronchitis   . Hypertension   . Venous insufficiency   . Hypercholesteremia   . Diverticulosis of colon   . Constipation   . Prolapse of vaginal vault after hysterectomy   . Proteinuria   . Fibromyalgia   . Somatic dysfunction   . Anxiety   . Personal history of noncompliance with medical treatment, presenting hazards to health   . Sickle-cell trait   . Allergy     Shell fish  . GERD (gastroesophageal reflux disease)   . Heart murmur   . Coronary artery disease   . Type II diabetes mellitus   . Blind left eye   . DJD (degenerative joint disease)   . Arthritis     "knees, back, probably left hand" (01/14/2015)  . History of gout   . Depression   . Stroke ~ 08/2014    "blind in left eye; weak on left side since" (01/14/2015)   Past Surgical History  Procedure Laterality Date  . Abdominal hysterectomy    . Robotic assisted laparoscopic sacrocolpopexy  09/2009    Dr. Matilde Sprang  . Cataract extraction w/ intraocular lens  implant, bilateral Bilateral 2012  . Tee without cardioversion N/A 09/02/2014    Procedure: TRANSESOPHAGEAL ECHOCARDIOGRAM (TEE);  Surgeon: Lelon Perla, MD;  Location: [facility] ENDOSCOPY;  Service: Cardiovascular;  Laterality: N/A;  . Loop recorder implant N/A 09/03/2014    Procedure: LOOP RECORDER IMPLANT;  Surgeon: Thompson Grayer, MD;  Location[facility] CATH LAB;  Service: Cardiovascular;  Laterality: N/A;  . Cardiac catheterization N/A 01/14/2015    Procedure: Left Heart Cath and Coronary Angiography;  Surgeon: Charolette Forward, MD;  Location: [place] CV LAB;  Service: Cardiovascular;  Laterality: N/A;  . Cardiac catheterization  ~ 2011  . Dilation and curettage of uterus  "probably"  . Coronary angioplasty      Current Outpatient Prescriptions  Medication Sig Dispense Refill  . aspirin 325 MG tablet Take 1 tablet (325 mg total) by mouth daily. 30 tablet 2  . atorvastatin (LIPITOR) 20 MG tablet Take 1 tablet (20 mg total) by mouth daily at 6 PM. 30 tablet 1  . Blood Glucose Monitoring Suppl (ACCU-CHEK AVIVA PLUS) W/DEVICE KIT Test blood sugar once daily 1 kit 0  . clopidogrel (PLAVIX) 75 MG tablet Take 75 mg by mouth daily.    [name] glucose blood (ACCU-CHEK AVIVA) test strip Check blood sugar once daily 100 each 4  . isosorbide mononitrate (IMDUR) 30 MG 24 hr tablet Take 30 mg by mouth daily.    [name] lisinopril (PRINIVIL,ZESTRIL) 20 MG tablet Take 20 mg by mouth daily.    . metFORMIN (GLUCOPHAGE) 500 MG tablet Take 1 tablet (500 mg total) by mouth 2 (  two) times daily with a meal. 60 tablet 3  . metoprolol (LOPRESSOR) 50 MG tablet Take 50 mg by mouth 2 (two) times daily.    . nitroGLYCERIN (NITROSTAT) 0.4 MG SL tablet Place 0.4 mg under the tongue every 5 (five) minutes as needed for chest pain.    . polyethylene glycol (MIRALAX / GLYCOLAX) packet Take 17 g by mouth daily as needed for moderate constipation.    . senna-docusate (SENOKOT-S) 8.6-50 MG per tablet Take 1 tablet by mouth at bedtime as needed for mild constipation or moderate constipation.     No current facility-administered medications for this visit.    Allergies:   Fish allergy;  Penicillins; Shellfish allergy; Peanuts; and Tomato   Social History: History   Social History  . Marital Status: Married    Spouse Name: N/A  . Number of Children: 4  . Years of Education: 13   Occupational History  . [employer]     retired   Social History Main Topics  . Smoking status: Never Smoker   . Smokeless tobacco: Never Used  . Alcohol Use: Yes     Comment: 01/14/2015 "might have a glass of wine a few times/yr"  . Drug Use: No  . Sexual Activity: No   Other Topics Concern  . Not on file   Social History Narrative   Married   Right handed   Caffeine use - 1 cup coffee daily    Family History: Family History  Problem Relation Age of Onset  . Prostate cancer Father   . Heart attack Neg Hx   . Stroke Neg Hx   . Hypertension Father   . Hypertension Sister     Review of Systems: All other systems reviewed and are otherwise negative except as noted above.   Physical Exam: VS:  BP 140/90 mmHg  Pulse 82  Ht 5' 6.5" (1.689 m)  Wt 183 lb (83.008 kg)  BMI 29.10 kg/m2 , BMI Body mass index is 29.1 kg/(m^2). Wt Readings from Last 3 Encounters:  01/22/15 183 lb (83.008 kg)  01/15/15 185 lb 6.5 oz (84.1 kg)  12/05/14 190 lb 6.4 oz (86.365 kg)    GEN- The patient is elderly and obese appearing, alert and oriented x 3 today.   HEENT: normocephalic, atraumatic; sclera clear, conjunctiva pink; hearing intact; oropharynx clear; neck supple, no JVP Lymph- no cervical lymphadenopathy Lungs- Clear to ausculation bilaterally, normal work of breathing.  No wheezes, rales, rhonchi Heart- Regular rate and rhythm GI- soft, non-tender, non-distended, bowel sounds present Extremities- no clubbing, cyanosis, or edema; DP/PT/radial pulses 1+ bilaterally MS- no significant deformity or atrophy Skin- warm and dry, no rash or lesion  Psych- euthymic mood, full affect Neuro- strength and sensation are intact   EKG:  EKG is ordered today. The ekg ordered today shows sinus  rhythm, rate 82, LBBB  Recent Labs: 09/01/2014: Magnesium 2.0 12/05/2014: ALT 15 01/15/2015: BUN 20; Creatinine, Ser 1.32*; Hemoglobin 12.5; Platelets 253; Potassium 3.8; Sodium 135    Other studies Reviewed: Additional studies/ records that were reviewed today include: hospital records  Assessment and Plan: 1.  SVT Identified on ILR interrogation The patient is relatively asymptomatic and episodes are short  Continue BB  She would like to avoid procedures until SVT becomes more lifestyle limiting. I think this is reasonable. Will follow burden remotely.  2.  Cryptogenic stroke Continue remote monitoring for atrial fibrillation No AF identified to date  3.  CAD  Management per Dr Terrence Dupont No recent  ischemic symptoms  4.  HTN BB recently uptitrated (yesterday) by Dr Terrence Dupont No other changes made today   Current medicines are reviewed at length with the patient today.   The patient does not have concerns regarding her medicines.  The following changes were made today:  none  Labs/ tests ordered today include: none    Disposition:   Follow up with [facility] remote transmissions, follow up in office prn for detected arrhythmias  Signed, Chanetta Marshall, NP 01/22/2015 2:23 PM   [place] [address] [place] [address] [phone] (office) [phone] (fax)

## 2015-01-22 NOTE — Telephone Encounter (Signed)
New Message        Pt calling stating that she received a call earlier about there being some activity on her device and they wanted her to come into our office and get it checked. Pt states that last Tuesday she had stints put in and she wants to know if that would be why the activity was going on with her device and if she still needs to come in. Please call back and advise.

## 2015-01-22 NOTE — Telephone Encounter (Signed)
She is on the schedule and present now

## 2015-01-22 NOTE — Patient Instructions (Signed)
Medication Instructions:   Your physician recommends that you continue on your current medications as directed. Please refer to the Current Medication list given to you today.   Labwork: NONE ORDER TODAY   Testing/Procedures: NONE ORDER TODAY   Follow-Up:  AS NEEDED FOR  ANY CARDIAC RELATED SYMPTOMS  CONTINUE YOUR ROUTINELY REMOTE MONITORING FOR YOUR DEVICE   Any Other Special Instructions Will Be Listed Below (If Applicable).

## 2015-01-23 LAB — CUP PACEART INCLINIC DEVICE CHECK: Date Time Interrogation Session: 20160804165004

## 2015-01-31 ENCOUNTER — Ambulatory Visit (INDEPENDENT_AMBULATORY_CARE_PROVIDER_SITE_OTHER): Payer: Medicare HMO | Admitting: *Deleted

## 2015-01-31 DIAGNOSIS — I639 Cerebral infarction, unspecified: Secondary | ICD-10-CM

## 2015-01-31 DIAGNOSIS — I10 Essential (primary) hypertension: Secondary | ICD-10-CM | POA: Diagnosis not present

## 2015-02-03 ENCOUNTER — Encounter: Payer: Self-pay | Admitting: Occupational Therapy

## 2015-02-03 ENCOUNTER — Ambulatory Visit
Payer: Medicare HMO | Attending: Physical Medicine & Rehabilitation | Admitting: Rehabilitative and Restorative Service Providers"

## 2015-02-03 ENCOUNTER — Ambulatory Visit: Payer: Medicare HMO | Admitting: Occupational Therapy

## 2015-02-03 VITALS — BP 141/88 | HR 88

## 2015-02-03 DIAGNOSIS — G8194 Hemiplegia, unspecified affecting left nondominant side: Secondary | ICD-10-CM

## 2015-02-03 DIAGNOSIS — M25642 Stiffness of left hand, not elsewhere classified: Secondary | ICD-10-CM

## 2015-02-03 DIAGNOSIS — I6931 Cognitive deficits following cerebral infarction: Secondary | ICD-10-CM | POA: Diagnosis present

## 2015-02-03 DIAGNOSIS — R269 Unspecified abnormalities of gait and mobility: Secondary | ICD-10-CM | POA: Diagnosis not present

## 2015-02-03 DIAGNOSIS — I69319 Unspecified symptoms and signs involving cognitive functions following cerebral infarction: Secondary | ICD-10-CM

## 2015-02-03 DIAGNOSIS — I6980 Unspecified sequelae of other cerebrovascular disease: Secondary | ICD-10-CM | POA: Diagnosis present

## 2015-02-03 DIAGNOSIS — R6889 Other general symptoms and signs: Secondary | ICD-10-CM

## 2015-02-03 DIAGNOSIS — R279 Unspecified lack of coordination: Secondary | ICD-10-CM | POA: Diagnosis present

## 2015-02-03 DIAGNOSIS — G8190 Hemiplegia, unspecified affecting unspecified side: Secondary | ICD-10-CM | POA: Insufficient documentation

## 2015-02-03 DIAGNOSIS — R2681 Unsteadiness on feet: Secondary | ICD-10-CM

## 2015-02-03 DIAGNOSIS — H53462 Homonymous bilateral field defects, left side: Secondary | ICD-10-CM | POA: Insufficient documentation

## 2015-02-03 DIAGNOSIS — IMO0002 Reserved for concepts with insufficient information to code with codable children: Secondary | ICD-10-CM

## 2015-02-03 DIAGNOSIS — R531 Weakness: Secondary | ICD-10-CM

## 2015-02-03 DIAGNOSIS — H539 Unspecified visual disturbance: Secondary | ICD-10-CM | POA: Diagnosis present

## 2015-02-03 NOTE — Therapy (Signed)
[facility] [address] [place], [place], [address] Phone: [phone]   Fax:  [phone]  Occupational Therapy Evaluation  Patient Details  Name: Kayla [name] MRN: [id number] Date of Birth: [birth year] Referring Provider:  Charlett Blake, MD  Encounter Date: 02/03/2015      OT End of Session - 02/03/15 1802    Visit Number 1   Number of Visits 17   Date for OT Re-Evaluation 04/04/15   Authorization Type [employer], no visit limit/auth, G-code needed   Authorization - Visit Number 1   Authorization - Number of Visits 10   OT Start Time 1018   OT Stop Time 1102   OT Time Calculation (min) 44 min   Activity Tolerance Patient tolerated treatment well   Behavior During Therapy Flat affect      Past Medical History  Diagnosis Date  . Asthmatic bronchitis   . Hypertension   . Venous insufficiency   . Hypercholesteremia   . Diverticulosis of colon   . Constipation   . Prolapse of vaginal vault after hysterectomy   . Proteinuria   . Fibromyalgia   . Somatic dysfunction   . Anxiety   . Personal history of noncompliance with medical treatment, presenting hazards to health   . Sickle-cell trait   . Allergy     Shell fish  . GERD (gastroesophageal reflux disease)   . Heart murmur   . Coronary artery disease   . Type II diabetes mellitus   . Blind left eye   . DJD (degenerative joint disease)   . Arthritis     "knees, back, probably left hand" (01/14/2015)  . History of gout   . Depression   . Stroke ~ 08/2014    "blind in left eye; weak on left side since" (01/14/2015)    Past Surgical History  Procedure Laterality Date  . Abdominal hysterectomy    . Robotic assisted laparoscopic sacrocolpopexy  09/2009    Dr. Matilde Sprang  . Cataract extraction w/ intraocular lens  implant, bilateral Bilateral 2012  . Tee without cardioversion N/A 09/02/2014    Procedure: TRANSESOPHAGEAL ECHOCARDIOGRAM (TEE);   Surgeon: Lelon Perla, MD;  Location: [facility] ENDOSCOPY;  Service: Cardiovascular;  Laterality: N/A;  . Loop recorder implant N/A 09/03/2014    Procedure: LOOP RECORDER IMPLANT;  Surgeon: Thompson Grayer, MD;  Location: [facility] CATH LAB;  Service: Cardiovascular;  Laterality: N/A;  . Cardiac catheterization N/A 01/14/2015    Procedure: Left Heart Cath and Coronary Angiography;  Surgeon: Charolette Forward, MD;  Location: [facility] CV LAB;  Service: Cardiovascular;  Laterality: N/A;  . Cardiac catheterization  ~ 2011  . Dilation and curettage of uterus  "probably"  . Coronary angioplasty      There were no vitals filed for this visit.  Visit Diagnosis:  Left hemiparesis  Lack of coordination due to stroke  Generalized weakness  Decreased activity tolerance  Unsteadiness  Visual disturbance  Left homonymous hemianopsia  Cognitive deficits following cerebral infarction  Stiffness of left hand joint      Subjective Assessment - 02/03/15 1023    Subjective  "My arm just feels stiff"   Patient is accompained by: Family member  daughter   Pertinent History CVA 08/19/14, hospitalization in 7/16 for cardiac procedure, see epic snapshot   Limitations hard of hearing, L homonymous hemianopsia   Patient Stated Goals Use LUE more   Currently in Pain? No/denies  [facility] OT Assessment - 02/03/15 1024    Assessment   Diagnosis R PCA stroke with L homonymous hemianopsia   Onset Date 08/19/14   Prior Therapy home health finished in June   Precautions   Precautions Fall;Other (comment)  L visual field deficit   Balance Screen   Has the patient fallen in the past 6 months Yes   How many times? 1  after CVA   Home  Environment   Family/patient expects to be discharged to: Private residence   Additional Comments has help with cleaning 2x/wk, daughter also assists   Lives With Spouse   Prior Function   Level of Independence Independent with basic ADLs;Independent with community  mobility without device   ADL   Eating/Feeding Needs assist with cutting food  difficulty opening bottles   Grooming Modified independent   Lower Body Bathing Modified independent   Upper Body Dressing Needs assist for fasteners  sometimes   Lower Body Dressing Modified independent   Toilet Tranfer Modified independent   Toileting - Clothing Manipulation Increased time   Tub/Shower Transfer Modified independent   IADL   Prior Level of Function Shopping independent   Shopping --  family performs, went 1x but was too fatigued   Prior Level of Function Light Housekeeping independent   Light Housekeeping --  performs light tasks, has someone come to help 2 days/wk   Prior Level of Function Meal Prep independent   Meal Prep --  not performing   Prior Level of Function Community Mobility drove   [employer] Relies on family or friends for transportation   Medication Management Takes responsibility if medication is prepared in advance in seperate dosage  pt does need reminders at times   Prior Level of Function Financial Management pt performed    Financial Management Dependent  daughter performs now   Mobility   Mobility Status Independent;History of falls   Mobility Status Comments ambulates without device   Written Expression   Dominant Hand Right   Vision - History   Baseline Vision Wears glasses only for reading   Vision Assessment   Visual Fields Left homonymous Hemainpsia   Patient has diffculty with activities due to visual impairment --  reading, bumping into items on L side   Comment Letter cancellation sheet with approx 75% accuracy with min v.c. initially   Activity Tolerance   Activity Tolerance --  fatigues with less than 10 min of activity   Cognition   Overall Cognitive Status Cognition to be further assessed in functional context PRN  pt reports "getting mixed up"   Mini Mental State Exam  --  unable to complete MOCA due to visual deficits   Attention  Selective   Selective Attention Appears intact  during eval    Memory Impaired   Memory Impairment Decreased short term memory   Awareness --  demo emergent for eval   Bradyphrenia --  slower processing   Behaviors Other (comment)  flat affect   Sensation   Light Touch Appears Intact  by gross assessment for LUE, but reports intermittent tingli   Coordination   9 Hole Peg Test Right;Left   Right 9 Hole Peg Test 35.16   Left 9 Hole Peg Test 75.10   ROM / Strength   AROM / PROM / Strength AROM;Strength;PROM   AROM   Overall AROM  Deficits   Overall AROM Comments WFL for LUE when compared with RUE except only 75% gross finger flex, approx 90% elbow extension,  particularly with overhead reaching   PROM   Overall PROM Comments only approx 75% finger flex with 8/10 pain with attempts for further PROM   Strength   Overall Strength Deficits   Overall Strength Comments RUE proximal strength grossly 4/5, LUE proximal strength grossly 3+/5   Hand Function   Right Hand Grip (lbs) 31   Left Hand Grip (lbs) 0                         OT Education - 02/03/15 1742    Education provided Yes   Education Details OT POC/goals, shower/bathroom modifications discussed at dtr request (2 grab bars, door vs. curtain, recommendation to try out at home improvement store)    Person(s) Educated Patient   Methods Explanation   Comprehension Verbalized understanding          OT Short Term Goals - 02/03/15 1808    OT SHORT TERM GOAL #1   Title Pt will be independent with initial HEP.--check STGs 03/05/15   Time 4   Period Weeks   Status New   OT SHORT TERM GOAL #2   Title Pt will perform tabletop visual scanning with at least 90% using compensation strategies prn without cues.   Baseline 75% with min cues   Time 4   Period Weeks   Status New   OT SHORT TERM GOAL #3   Title Pt will improve coordination for ADLs as shown by improving time on 9-hole peg test by at least 10sec  with LUE.   Baseline 75.10sec   Time 4   Period Weeks   Status New   OT SHORT TERM GOAL #4   Title Pt will demo at least 90% gross finger flex for grasping objects with LUE.   Baseline 75%   Time 4   Period Weeks   Status New   OT SHORT TERM GOAL #5   Title Pt will be able to complete fasteners mod I consistently.   Time 4   Period Weeks   Status New           OT Long Term Goals - 02/03/15 1811    OT LONG TERM GOAL #1   Title Pt will be independent with updated HEP.--check LTGs 04/04/15   Time 8   Period Weeks   Status New   OT LONG TERM GOAL #2   Title Pt will verbalize understanding of cognitive compensation strategies for ADLs prn.   Time 8   Period Weeks   Status New   OT LONG TERM GOAL #3   Title Pt will perform simple environmental scanning with at least 90% accuracy for improved safety.   Time 8   Period Weeks   Status New   OT LONG TERM GOAL #4   Title Pt will improve coordination for ADLs as shown by improving time on 9-hole peg test by at least 20sec with LUE.   Baseline 75.10sec   Time 8   Period Weeks   Status New   OT LONG TERM GOAL #5   Title Pt will demo at least 15lbs L grip strength to assist in opening containers.   Baseline 0lbs   Time 8   Period Weeks   Status New   Long Term Additional Goals   Additional Long Term Goals Yes   OT LONG TERM GOAL #6   Title Pt will perform simple snack prep/meal prep with supervision.   Baseline not performing   Time  8   Period Weeks   Status New               Plan - 2015-02-18 1803    Clinical Impression Statement Pt s/p R PCA stroke 08/19/14 followed by home health therapies after hospital d/c and recent 7/16 hospitalization for cardiac procedure.  Pt presents with L hemiparesis with decr strength, decr ROM, decr coordination, visual deficits (L homonymous hemianopsia), decr balance for ADLs, decr activity tolerance, and cognitive deficits.  Pt would benefit from skilled OT to address these  deficits in order to incr particpation in ADLs/IADLs and incr LUE functional use.   Pt will benefit from skilled therapeutic intervention in order to improve on the following deficits (Retired) Decreased cognition;Decreased balance;Decreased strength;Impaired vision/preception;Impaired UE functional use;Decreased range of motion;Decreased coordination;Decreased activity tolerance   Rehab Potential Good   OT Frequency 2x / week   OT Duration 8 weeks  +eval   OT Treatment/Interventions Self-care/ADL training;Cryotherapy;Electrical Stimulation;Parrafin;Energy conservation;Manual Therapy;Visual/perceptual remediation/compensation;Cognitive remediation/compensation;Passive range of motion;Functional Mobility Training;Neuromuscular education;Ultrasound;Therapeutic exercise;Therapeutic activities;Therapeutic exercises;Moist Heat;Fluidtherapy;DME and/or AE instruction;Splinting;Patient/family education   Plan HEP for LUE coordination   Consulted and Agree with Plan of Care Patient;Family member/caregiver   Family Member Consulted daughter          G-Codes - 18-Feb-2015 1815    Functional Assessment Tool Used L 9-hole peg test:  75.10sec, L grip strength:  0lbs, approx 75% L gross finger flex   Functional Limitation Carrying, moving and handling objects   Carrying, Moving and Handling Objects Current Status [id number]) At least 60 percent but less than 80 percent impaired, limited or restricted   Carrying, Moving and Handling Objects Goal Status (G8366) At least 20 percent but less than 40 percent impaired, limited or restricted      Problem List Patient Active Problem List   Diagnosis Date Noted  . New-onset angina 01/14/2015  . Left homonymous hemianopsia 09/06/2014  . Occipital infarction 09/05/2014  . AKI (acute kidney injury)   . Acute ischemic right MCA stroke 08/30/2014  . Acute right PCA stroke 08/30/2014  . Abnormal chest x-ray 08/22/2014  . Type 2 diabetes mellitus with diabetic neuropathy  01/04/2014  . HYPERCHOLESTEROLEMIA, MILD 05/22/2009  . PERS HX NONCOMPLIANCE W/MED TX PRS HAZARDS HLTH 07/11/2007  . Essential hypertension 05/04/2007  . Osteoarthritis 05/04/2007  . Fibromyalgia 05/04/2007    [facility] 02-18-15, 6:20 PM  [facility] [address] [place], [place], [address] Phone: [phone]   Fax:  [place], OTR/L 2015/02/18 6:20 PM

## 2015-02-04 NOTE — Therapy (Signed)
[facility] [address] [place], [place], [address] Phone: [phone]   Fax:  [phone]  Physical Therapy Evaluation  Patient Details  Name: Kayla [name] MRN: [id number] Date of Birth: [birth year] Referring Provider:  Charlett Blake, MD  Encounter Date: 02/03/2015      PT End of Session - 02/04/15 0853    Visit Number 1   Number of Visits 16   Date for PT Re-Evaluation 04/05/15   Authorization Type G code every 10th visit, copay   PT Start Time 0930   PT Stop Time 1015   PT Time Calculation (min) 45 min   Equipment Utilized During Treatment Gait belt   Activity Tolerance Patient tolerated treatment well   Behavior During Therapy Flat affect      Past Medical History  Diagnosis Date  . Asthmatic bronchitis   . Hypertension   . Venous insufficiency   . Hypercholesteremia   . Diverticulosis of colon   . Constipation   . Prolapse of vaginal vault after hysterectomy   . Proteinuria   . Fibromyalgia   . Somatic dysfunction   . Anxiety   . Personal history of noncompliance with medical treatment, presenting hazards to health   . Sickle-cell trait   . Allergy     Shell fish  . GERD (gastroesophageal reflux disease)   . Heart murmur   . Coronary artery disease   . Type II diabetes mellitus   . Blind left eye   . DJD (degenerative joint disease)   . Arthritis     "knees, back, probably left hand" (01/14/2015)  . History of gout   . Depression   . Stroke ~ 08/2014    "blind in left eye; weak on left side since" (01/14/2015)    Past Surgical History  Procedure Laterality Date  . Abdominal hysterectomy    . Robotic assisted laparoscopic sacrocolpopexy  09/2009    Dr. Matilde Sprang  . Cataract extraction w/ intraocular lens  implant, bilateral Bilateral 2012  . Tee without cardioversion N/A 09/02/2014    Procedure: TRANSESOPHAGEAL ECHOCARDIOGRAM (TEE);  Surgeon: Lelon Perla, MD;  Location: [facility]  ENDOSCOPY;  Service: Cardiovascular;  Laterality: N/A;  . Loop recorder implant N/A 09/03/2014    Procedure: LOOP RECORDER IMPLANT;  Surgeon: Thompson Grayer, MD;  Location: [facility] CATH LAB;  Service: Cardiovascular;  Laterality: N/A;  . Cardiac catheterization N/A 01/14/2015    Procedure: Left Heart Cath and Coronary Angiography;  Surgeon: Charolette Forward, MD;  Location: [facility] CV LAB;  Service: Cardiovascular;  Laterality: N/A;  . Cardiac catheterization  ~ 2011  . Dilation and curettage of uterus  "probably"  . Coronary angioplasty      Filed Vitals:   02/03/15 1009  BP: 141/88  Pulse: 88  SpO2: 96%    Visit Diagnosis:  Abnormality of gait  Generalized weakness      Subjective Assessment - 02/03/15 0943    Subjective The patient is s/p CVA 08/30/2014, remaining in IP rehab until 09/17/2014.  She continues with L visual field diminished (homonymous hemianopsia) since stroke.  She notes difficulty using L hand during showers and daily tasks.  She notes frequent imbalance, however she has stopped using a cane.  "I don't go out much."   Patient is accompained by: Family member  daughter and granddaughter   Pertinent History DM, HTN, peripheral neuropathy   Patient Stated Goals improve use of L UE and improve balance.   Currently in Pain? No/denies  [facility] PT Assessment - 02/03/15 0951    Assessment   Medical Diagnosis R PCA   Onset Date/Surgical Date 08/30/14   Hand Dominance Right   Prior Therapy acute, IP rehab, HH PT/OT/ST   Precautions   Precautions Fall;Other (comment)   Precaution Comments --  L visual field diminished   Balance Screen   Has the patient fallen in the past 6 months Yes   How many times? --  1   Has the patient had a decrease in activity level because of a fear of falling?  Yes   Is the patient reluctant to leave their home because of a fear of falling?  Yes   [place] residence   Living Arrangements  Spouse/significant other;Children  children check in regularly   Type of [place] to enter   Entrance Stairs-Number of Steps --  4-5 at front door, level entry side door   [place] One level  2-3 steps to access first floor living spaces   [place] - single point;Shower seat;Toilet riser   Prior Function   Level of Independence Independent with community mobility without device;Independent with gait   Sensation   Light Touch Impaired Detail   Light Touch Impaired Details --  neuropathy/ burning in legs   ROM / Strength   AROM / PROM / Strength AROM;Strength   AROM   Overall AROM  Within functional limits for tasks performed   Strength   Overall Strength Deficits   Overall Strength Comments R UE 4/5 grossly, L UE 3+/5 with gross assessment; R hip flexion 4/5, L hip flexion 4/5, R knee extension/flexion 4/5, L knee extension/flexion, Bilateral ankle dorsiflexion 4/5.   Palpation   Palpation comment Due to shortness of breath and R calf pain with gait, PT palpated R calf, no edema, no increased warmth or skin discoloration/redness noted   Ambulation/Gait   Ambulation/Gait Yes   Ambulation/Gait Assistance 5: Supervision   Ambulation Distance (Feet) 200 Feet   Gait Pattern --  R antalgic pattern, shortness of breath with gait   Ambulation Surface Level   Gait velocity 1.98 ft/sec   Stairs Yes   Stairs Assistance 4: Min guard   Stair Management Technique Two rails;Step to pattern   Number of Stairs 4   Gait Comments fatigue and decreased endurance   Standardized Balance Assessment   Standardized Balance Assessment Berg Balance Test   Berg Balance Test   Sit to Stand Able to stand  independently using hands   Standing Unsupported Able to stand safely 2 minutes   Sitting with Back Unsupported but Feet Supported on Floor or Stool Able to sit safely and securely 2 minutes   Stand to Sit Sits safely with minimal use of hands   Transfers Able to  transfer safely, definite need of hands   Standing Unsupported with Eyes Closed Able to stand 3 seconds   Standing Ubsupported with Feet Together Able to place feet together independently and stand for 1 minute with supervision   From Standing, Reach Forward with Outstretched Arm Reaches forward but needs supervision   From Standing Position, Pick up Object from Floor Able to pick up shoe, needs supervision   From Standing Position, Turn to Look Behind Over each Shoulder Turn sideways only but maintains balance   Turn 360 Degrees Needs assistance while turning   Standing Unsupported, Alternately Place Feet on Step/Stool Able to complete >2 steps/needs minimal assist  Standing Unsupported, One Foot in [place] to take small step independently and hold 30 seconds   Standing on One Leg Tries to lift leg/unable to hold 3 seconds but remains standing independently   Total Score 33     FOTO functional status survey=50%  SIS mobility=88.9%        PT Short Term Goals - 02/04/15 1439    PT SHORT TERM GOAL #1   Title The patient will be able to perform HEP with assist from family.   Baseline Target date 03/06/2015   Time 4   Period Weeks   PT SHORT TERM GOAL #2   Title The patient will improve Berg score from 33/56 to > or equal to 40/56 to demo decreased risk for fall.   Baseline Target date 03/06/2015   Time 4   Period Weeks   PT SHORT TERM GOAL #3   Title The patient will improve ambulation speed from 1.98 ft/sec to > or equal to 2.4 ft/sec to demo improving mobility.   Baseline Target date 03/06/2015   Time 4   Period Weeks   PT SHORT TERM GOAL #4   Title The patient will negotiate 4 steps with close supervision with bilateral handrails and step to pattern.   Baseline Target date 03/06/2015   Time 4   Period Weeks           PT Long Term Goals - 02/04/15 1442    PT LONG TERM GOAL #1   Title The patient will improve functional status score from 50% up to > or equal to 62% for  improved perception of disability.   Baseline Target date 04/05/2015   Time 8   Period Weeks   PT LONG TERM GOAL #2   Title The patient will improve Berg score from 33/56 to > or equal to 43/56 to demo decreasing risk for falls.   Baseline Target date 04/05/2015   Time 8   Period Weeks   PT LONG TERM GOAL #3   Title The patient will improve endurance by increasing ambulation distance from 200 ft nonstop to > than or equal to 400 ft nonstop.   Baseline Target date 04/05/2015   Time 8   Period Weeks   PT LONG TERM GOAL #4   Title The patient will improve gait speed from 1.98 ft/sec to > or equal to 2.62 ft/sec to demo transition to full community ambulator classification of gait.   Baseline Target date 04/05/2015   Time 8   Period Weeks               Plan - 02/04/15 1444    Clinical Impression Statement The patient is a 74 yo female with h/o CVA and recent cardiac catheterization presenting to PT with limitations in safe, functional mobility.   Pt will benefit from skilled therapeutic intervention in order to improve on the following deficits Abnormal gait;Decreased balance;Difficulty walking;Decreased endurance;Decreased strength   Rehab Potential Good   PT Frequency 2x / week   PT Duration 8 weeks  may decrease to 1x/week in 4 weeks   PT Treatment/Interventions ADLs/Self Care Home Management;Stair training;Functional mobility training;Patient/family education;Therapeutic activities;Therapeutic exercise;Gait training;Neuromuscular re-education;Balance training   PT Next Visit Plan HEP: endurance, LE strengthening, balance activities (general mobility)   Consulted and Agree with Plan of Care Patient         Problem List Patient Active Problem List   Diagnosis Date Noted  . New-onset angina 01/14/2015  . Left homonymous hemianopsia 09/06/2014  .  Occipital infarction 09/05/2014  . AKI (acute kidney injury)   . Acute ischemic right MCA stroke 08/30/2014  . Acute right  PCA stroke 08/30/2014  . Abnormal chest x-ray 08/22/2014  . Type 2 diabetes mellitus with diabetic neuropathy 01/04/2014  . HYPERCHOLESTEROLEMIA, MILD 05/22/2009  . PERS HX NONCOMPLIANCE W/MED TX PRS HAZARDS HLTH 07/11/2007  . Essential hypertension 05/04/2007  . Osteoarthritis 05/04/2007  . Fibromyalgia 05/04/2007    Dunya Meiners, PT 02/04/2015, 2:49 PM  [facility] [address] [place], [place], [address] Phone: [phone]   Fax:  [phone]

## 2015-02-04 NOTE — Progress Notes (Signed)
Loop recorder 

## 2015-02-07 ENCOUNTER — Encounter: Payer: Self-pay | Admitting: Internal Medicine

## 2015-02-11 ENCOUNTER — Ambulatory Visit: Payer: Medicare HMO | Admitting: Physical Therapy

## 2015-02-11 ENCOUNTER — Encounter: Payer: Self-pay | Admitting: Physical Therapy

## 2015-02-11 ENCOUNTER — Encounter: Payer: Self-pay | Admitting: Internal Medicine

## 2015-02-11 ENCOUNTER — Ambulatory Visit: Payer: Medicare HMO | Admitting: Occupational Therapy

## 2015-02-11 DIAGNOSIS — R2681 Unsteadiness on feet: Secondary | ICD-10-CM

## 2015-02-11 DIAGNOSIS — R269 Unspecified abnormalities of gait and mobility: Secondary | ICD-10-CM

## 2015-02-11 DIAGNOSIS — G8194 Hemiplegia, unspecified affecting left nondominant side: Secondary | ICD-10-CM

## 2015-02-11 DIAGNOSIS — R6889 Other general symptoms and signs: Secondary | ICD-10-CM

## 2015-02-11 DIAGNOSIS — H53462 Homonymous bilateral field defects, left side: Secondary | ICD-10-CM

## 2015-02-11 DIAGNOSIS — R531 Weakness: Secondary | ICD-10-CM

## 2015-02-11 DIAGNOSIS — H539 Unspecified visual disturbance: Secondary | ICD-10-CM

## 2015-02-11 DIAGNOSIS — IMO0002 Reserved for concepts with insufficient information to code with codable children: Secondary | ICD-10-CM

## 2015-02-11 DIAGNOSIS — I69319 Unspecified symptoms and signs involving cognitive functions following cerebral infarction: Secondary | ICD-10-CM

## 2015-02-11 NOTE — Patient Instructions (Signed)
  Coordination Activities  Perform the following activities for 20 minutes 1-2 times per day with left hand(s).   Rotate ball in fingertips (clockwise and counter-clockwise).  Toss ball in air and catch with the same hand.  Flip cards 1 at a time   Deal cards with your thumb (Hold deck in hand and push card off top with thumb).  Pick up coins, buttons, beads, marbles, paperclip, key, dried beans/pasta (anything small of different sizes/shapes) and place in container.  Try not to drag it to the edge of the table.  Pick up coins and place in container or coin bank.  Squeeze yellow putty with your whole hand.  Roll out yellow putty into log/snake and pinch with each finger/thumb down length of putty.  Place coins or beads into yellow putty and then remove using just your left hand.

## 2015-02-11 NOTE — Patient Instructions (Signed)
Bridging   Slowly raise buttocks from floor, keeping stomach tight. Hold for 5 seconds. Repeat _10___ times per set. Do _1-2___ sessions per day.  http://orth.exer.us/1096   Copyright  VHI. All rights reserved.  Strengthening: Hip Abductor - Resisted   Lying down with red band looped around both legs above knees, push thighs apart. Hold 5 seconds. Repeat 10__ times per set. Do __1-2__ sessions per day.  http://orth.exer.us/688   Copyright  VHI. All rights reserved.  EXTENSION: Prone - Knee Flexed (Active)   Lie on stomach, right knee bent to 90. Lift leg toward ceiling. Repeat for 10 reps. Repeat with other leg. 1-2 sessions a day.  http://gtsc.exer.us/67   Copyright  VHI. All rights reserved.  Functional Quadriceps: Sit to Stand   Sit on edge of chair, feet flat on floor. Stand upright, extending knees fully. Repeat __10__ times per set. Do _1-2___ sessions per day.  http://orth.exer.us/735   Copyright  VHI. All rights reserved.     "I love a Database administrator   At counter for balance as needed: high knee marching forward and then backward. 3 second pauses with each knee lift.  Repeat 3 laps each way. Do _1-2_ sessions per day. http://gt2.exer.us/345   Copyright  VHI. All rights reserved.  Side-Stepping   At counter for balance as needed: Walk to left side with eyes open. Take even steps, leading with same foot. Make sure each foot lifts off the floor. Repeat in opposite direction. Keep feet pointed toward counter. Repeat for 3 laps each way.  Do _1-2__ sessions per day.  Copyright  VHI. All rights reserved.  Walking on Heels   At counter: Walk on heels forward while continuing on a straight path, and then walk on heels backward to starting position. Repeat for 3 laps each way. Do _1-2__ sessions per day.  Copyright  VHI. All rights reserved.  Walking on Toes   At counter for balance as needed: Walk on toes forward while continuing on a straight path,  and then backwards on toes to starting position. Repeat 3 laps each way. Do _1-2___ sessions per day.  Copyright  VHI. All rights reserved.  Feet Heel-Toe "Tandem"   At counter: Arms at sides, walk a straight line forward bringing one foot directly in front of the other, and then a straight line backwards bringing one foot directly behind the other one.  Repeat for _3 laps each way. Do _1-2_ sessions per day.  Copyright  VHI. All rights reserved.

## 2015-02-11 NOTE — Therapy (Signed)
[facility] [address] [place], [place], [address] Phone: [phone]   Fax:  [phone]  Physical Therapy Treatment  Patient Details  Name: Kayla [name] MRN: [id number] Date of Birth: 11/17/1940 Referring Provider:  Olga Millers, MD  Encounter Date: 02/11/2015      PT End of Session - 02/11/15 1451    Visit Number 2   Number of Visits 16   Date for PT Re-Evaluation 04/05/15   Authorization Type G code every 10th visit, copay   PT Start Time 1446   PT Stop Time 1530   PT Time Calculation (min) 44 min   Equipment Utilized During Treatment Gait belt   Activity Tolerance Patient tolerated treatment well   Behavior During Therapy Flat affect      Past Medical History  Diagnosis Date  . Asthmatic bronchitis   . Hypertension   . Venous insufficiency   . Hypercholesteremia   . Diverticulosis of colon   . Constipation   . Prolapse of vaginal vault after hysterectomy   . Proteinuria   . Fibromyalgia   . Somatic dysfunction   . Anxiety   . Personal history of noncompliance with medical treatment, presenting hazards to health   . Sickle-cell trait   . Allergy     Shell fish  . GERD (gastroesophageal reflux disease)   . Heart murmur   . Coronary artery disease   . Type II diabetes mellitus   . Blind left eye   . DJD (degenerative joint disease)   . Arthritis     "knees, back, probably left hand" (01/14/2015)  . History of gout   . Depression   . Stroke ~ 08/2014    "blind in left eye; weak on left side since" (01/14/2015)    Past Surgical History  Procedure Laterality Date  . Abdominal hysterectomy    . Robotic assisted laparoscopic sacrocolpopexy  09/2009    Dr. Matilde Sprang  . Cataract extraction w/ intraocular lens  implant, bilateral Bilateral 2012  . Tee without cardioversion N/A 09/02/2014    Procedure: TRANSESOPHAGEAL ECHOCARDIOGRAM (TEE);  Surgeon: Lelon Perla, MD;  Location: [facility]  ENDOSCOPY;  Service: Cardiovascular;  Laterality: N/A;  . Loop recorder implant N/A 09/03/2014    Procedure: LOOP RECORDER IMPLANT;  Surgeon: Thompson Grayer, MD;  Location: [facility] CATH LAB;  Service: Cardiovascular;  Laterality: N/A;  . Cardiac catheterization N/A 01/14/2015    Procedure: Left Heart Cath and Coronary Angiography;  Surgeon: Charolette Forward, MD;  Location: [facility] CV LAB;  Service: Cardiovascular;  Laterality: N/A;  . Cardiac catheterization  ~ 2011  . Dilation and curettage of uterus  "probably"  . Coronary angioplasty      There were no vitals filed for this visit.  Visit Diagnosis:  Lack of coordination due to stroke  Generalized weakness  Decreased activity tolerance  Unsteadiness  Abnormality of gait      Subjective Assessment - 02/11/15 1450    Subjective No new complaints. No falls or pain to report. Reports the only exercise she is currently doing in riding a stationary bike at home with no resistance for 1.5 miles (time it takes varies day to day).   Currently in Pain? No/denies     Treatment: Exercises for lower extremity strengthening and for balance performed and issued for HEP. Refer to pt instruction section for full details.          PT Education - 02/11/15 1521    Education provided Yes  Education Details HEP: exercises for strengthening of both legs and for balance.   Person(s) Educated Patient;Child(ren)  daughter   Methods Explanation;Demonstration;Handout   Comprehension Verbalized understanding;Returned demonstration;Verbal cues required;Need further instruction          PT Short Term Goals - 02/04/15 1439    PT SHORT TERM GOAL #1   Title The patient will be able to perform HEP with assist from family.   Baseline Target date 03/06/2015   Time 4   Period Weeks   PT SHORT TERM GOAL #2   Title The patient will improve Berg score from 33/56 to > or equal to 40/56 to demo decreased risk for fall.   Baseline Target date 03/06/2015    Time 4   Period Weeks   PT SHORT TERM GOAL #3   Title The patient will improve ambulation speed from 1.98 ft/sec to > or equal to 2.4 ft/sec to demo improving mobility.   Baseline Target date 03/06/2015   Time 4   Period Weeks   PT SHORT TERM GOAL #4   Title The patient will negotiate 4 steps with close supervision with bilateral handrails and step to pattern.   Baseline Target date 03/06/2015   Time 4   Period Weeks           PT Long Term Goals - 02/04/15 1442    PT LONG TERM GOAL #1   Title The patient will improve functional status score from 50% up to > or equal to 62% for improved perception of disability.   Baseline Target date 04/05/2015   Time 8   Period Weeks   PT LONG TERM GOAL #2   Title The patient will improve Berg score from 33/56 to > or equal to 43/56 to demo decreasing risk for falls.   Baseline Target date 04/05/2015   Time 8   Period Weeks   PT LONG TERM GOAL #3   Title The patient will improve endurance by increasing ambulation distance from 200 ft nonstop to > than or equal to 400 ft nonstop.   Baseline Target date 04/05/2015   Time 8   Period Weeks   PT LONG TERM GOAL #4   Title The patient will improve gait speed from 1.98 ft/sec to > or equal to 2.62 ft/sec to demo transition to full community ambulator classification of gait.   Baseline Target date 04/05/2015   Time 8   Period Weeks           Plan - 02/11/15 1452    Clinical Impression Statement Educated pt and her daughter on exercises for strengthening and balance for home with no issues reported. Pt making steady progress toward goals.   Pt will benefit from skilled therapeutic intervention in order to improve on the following deficits Abnormal gait;Decreased balance;Difficulty walking;Decreased endurance;Decreased strength   Rehab Potential Good   PT Frequency 2x / week   PT Duration 8 weeks  may decrease to 1x/week in 4 weeks   PT Treatment/Interventions ADLs/Self Care Home  Management;Stair training;Functional mobility training;Patient/family education;Therapeutic activities;Therapeutic exercise;Gait training;Neuromuscular re-education;Balance training   PT Next Visit Plan Continue to work on strengthening, balance and gait toward STG's.   Consulted and Agree with Plan of Care Patient;Family member/caregiver   Family Member Consulted Jeannene Patella, pt's daughter        Problem List Patient Active Problem List   Diagnosis Date Noted  . New-onset angina 01/14/2015  . Left homonymous hemianopsia 09/06/2014  . Occipital infarction 09/05/2014  . AKI (  acute kidney injury)   . Acute ischemic right MCA stroke 08/30/2014  . Acute right PCA stroke 08/30/2014  . Abnormal chest x-ray 08/22/2014  . Type 2 diabetes mellitus with diabetic neuropathy 01/04/2014  . HYPERCHOLESTEROLEMIA, MILD 05/22/2009  . PERS HX NONCOMPLIANCE W/MED TX PRS HAZARDS HLTH 07/11/2007  . Essential hypertension 05/04/2007  . Osteoarthritis 05/04/2007  . Fibromyalgia 05/04/2007    Willow Ora 02/12/2015, 10:06 AM  Willow Ora, PTA, [facility] [address] [place], [place] [address] [phone] 02/12/2015, 10:06 AM

## 2015-02-11 NOTE — Therapy (Signed)
[facility] [address] [place], [place], [address] Phone: [phone]   Fax:  [phone]  Occupational Therapy Treatment  Patient Details  Name: Kayla [name] MRN: [id number] Date of Birth: [birth year] Referring Provider:  Olga Millers, MD  Encounter Date: 02/11/2015      OT End of Session - 02/11/15 1615    Visit Number 2   Number of Visits 17   Date for OT Re-Evaluation 04/04/15   Authorization Type [employer], no visit limit/auth, G-code needed   Authorization - Visit Number 2   Authorization - Number of Visits 10   OT Start Time 1535   OT Stop Time 1617   OT Time Calculation (min) 42 min   Activity Tolerance Patient tolerated treatment well   Behavior During Therapy Flat affect      Past Medical History  Diagnosis Date  . Asthmatic bronchitis   . Hypertension   . Venous insufficiency   . Hypercholesteremia   . Diverticulosis of colon   . Constipation   . Prolapse of vaginal vault after hysterectomy   . Proteinuria   . Fibromyalgia   . Somatic dysfunction   . Anxiety   . Personal history of noncompliance with medical treatment, presenting hazards to health   . Sickle-cell trait   . Allergy     Shell fish  . GERD (gastroesophageal reflux disease)   . Heart murmur   . Coronary artery disease   . Type II diabetes mellitus   . Blind left eye   . DJD (degenerative joint disease)   . Arthritis     "knees, back, probably left hand" (01/14/2015)  . History of gout   . Depression   . Stroke ~ 08/2014    "blind in left eye; weak on left side since" (01/14/2015)    Past Surgical History  Procedure Laterality Date  . Abdominal hysterectomy    . Robotic assisted laparoscopic sacrocolpopexy  09/2009    Dr. Matilde Sprang  . Cataract extraction w/ intraocular lens  implant, bilateral Bilateral 2012  . Tee without cardioversion N/A 09/02/2014    Procedure: TRANSESOPHAGEAL ECHOCARDIOGRAM (TEE);   Surgeon: Lelon Perla, MD;  Location: [facility] ENDOSCOPY;  Service: Cardiovascular;  Laterality: N/A;  . Loop recorder implant N/A 09/03/2014    Procedure: LOOP RECORDER IMPLANT;  Surgeon: Thompson Grayer, MD;  Location: [facility] CATH LAB;  Service: Cardiovascular;  Laterality: N/A;  . Cardiac catheterization N/A 01/14/2015    Procedure: Left Heart Cath and Coronary Angiography;  Surgeon: Charolette Forward, MD;  Location: [facility] CV LAB;  Service: Cardiovascular;  Laterality: N/A;  . Cardiac catheterization  ~ 2011  . Dilation and curettage of uterus  "probably"  . Coronary angioplasty      There were no vitals filed for this visit.  Visit Diagnosis:  Left hemiparesis  Lack of coordination due to stroke  Visual disturbance  Left homonymous hemianopsia  Cognitive deficits following cerebral infarction      Subjective Assessment - 02/11/15 1538    Subjective  "nothing new"   Patient is accompained by: Family member   Pertinent History CVA 08/19/14, hospitalization in 7/16 for cardiac procedure, see epic snapshot   Limitations hard of hearing, L homonymous hemianopsia   Patient Stated Goals Use LUE more   Currently in Pain? No/denies  OT Education - 02/11/15 1538    Education Details HEP (coordination and yellow putty)   Person(s) Educated Patient;Child(ren)   Methods Explanation;Demonstration;Handout;Verbal cues   Comprehension Verbalized understanding;Returned demonstration;Verbal cues required;Need further instruction  cueing to avoid helping with RUE and for placements of objects at midline (vs. on R side for incr spatial awareness )          OT Short Term Goals - 02/03/15 1808    OT SHORT TERM GOAL #1   Title Pt will be independent with initial HEP.--check STGs 03/05/15   Time 4   Period Weeks   Status New   OT SHORT TERM GOAL #2   Title Pt will perform tabletop visual scanning with at least 90% using compensation strategies prn  without cues.   Baseline 75% with min cues   Time 4   Period Weeks   Status New   OT SHORT TERM GOAL #3   Title Pt will improve coordination for ADLs as shown by improving time on 9-hole peg test by at least 10sec with LUE.   Baseline 75.10sec   Time 4   Period Weeks   Status New   OT SHORT TERM GOAL #4   Title Pt will demo at least 90% gross finger flex for grasping objects with LUE.   Baseline 75%   Time 4   Period Weeks   Status New   OT SHORT TERM GOAL #5   Title Pt will be able to complete fasteners mod I consistently.   Time 4   Period Weeks   Status New           OT Long Term Goals - 02/03/15 1811    OT LONG TERM GOAL #1   Title Pt will be independent with updated HEP.--check LTGs 04/04/15   Time 8   Period Weeks   Status New   OT LONG TERM GOAL #2   Title Pt will verbalize understanding of cognitive compensation strategies for ADLs prn.   Time 8   Period Weeks   Status New   OT LONG TERM GOAL #3   Title Pt will perform simple environmental scanning with at least 90% accuracy for improved safety.   Time 8   Period Weeks   Status New   OT LONG TERM GOAL #4   Title Pt will improve coordination for ADLs as shown by improving time on 9-hole peg test by at least 20sec with LUE.   Baseline 75.10sec   Time 8   Period Weeks   Status New   OT LONG TERM GOAL #5   Title Pt will demo at least 15lbs L grip strength to assist in opening containers.   Baseline 0lbs   Time 8   Period Weeks   Status New   Long Term Additional Goals   Additional Long Term Goals Yes   OT LONG TERM GOAL #6   Title Pt will perform simple snack prep/meal prep with supervision.   Baseline not performing   Time 8   Period Weeks   Status New               Plan - 02/11/15 1610    Clinical Impression Statement Pt demo improved coordination with repetition, but frustrates easily.    Plan visual scanning, coordination   Consulted and Agree with Plan of Care Patient;Family  member/caregiver   Family Member Consulted daughter        Problem List Patient Active Problem List   Diagnosis Date  Noted  . New-onset angina 01/14/2015  . Left homonymous hemianopsia 09/06/2014  . Occipital infarction 09/05/2014  . AKI (acute kidney injury)   . Acute ischemic right MCA stroke 08/30/2014  . Acute right PCA stroke 08/30/2014  . Abnormal chest x-ray 08/22/2014  . Type 2 diabetes mellitus with diabetic neuropathy 01/04/2014  . HYPERCHOLESTEROLEMIA, MILD 05/22/2009  . PERS HX NONCOMPLIANCE W/MED TX PRS HAZARDS HLTH 07/11/2007  . Essential hypertension 05/04/2007  . Osteoarthritis 05/04/2007  . Fibromyalgia 05/04/2007    [facility] 02/11/2015, 4:45 PM  [facility] [address] [place], [place], [address] Phone: [phone]   Fax:  [place], OTR/L 02/11/2015 4:45 PM

## 2015-02-14 ENCOUNTER — Ambulatory Visit: Payer: Medicare HMO

## 2015-02-14 DIAGNOSIS — R269 Unspecified abnormalities of gait and mobility: Secondary | ICD-10-CM

## 2015-02-14 DIAGNOSIS — G8194 Hemiplegia, unspecified affecting left nondominant side: Secondary | ICD-10-CM

## 2015-02-14 DIAGNOSIS — R2681 Unsteadiness on feet: Secondary | ICD-10-CM

## 2015-02-14 NOTE — Therapy (Addendum)
[facility] [address] [place], [place], [address] Phone: [phone]   Fax:  [phone]  Physical Therapy Treatment  Patient Details  Name: Kayla [name] MRN: [id number] Date of Birth: 02/01/1941 Referring Provider:  Charlett Blake, MD  Encounter Date: 02/14/2015      PT End of Session - 02/14/15 1228    Visit Number 3   Number of Visits 16   Date for PT Re-Evaluation 04/05/15   Authorization Type G code every 10th visit, copay   PT Start Time 1143   PT Stop Time 1224   PT Time Calculation (min) 41 min   Equipment Utilized During Treatment Gait belt   Activity Tolerance Patient tolerated treatment well   Behavior During Therapy Flat affect      Past Medical History  Diagnosis Date  . Asthmatic bronchitis   . Hypertension   . Venous insufficiency   . Hypercholesteremia   . Diverticulosis of colon   . Constipation   . Prolapse of vaginal vault after hysterectomy   . Proteinuria   . Fibromyalgia   . Somatic dysfunction   . Anxiety   . Personal history of noncompliance with medical treatment, presenting hazards to health   . Sickle-cell trait   . Allergy     Shell fish  . GERD (gastroesophageal reflux disease)   . Heart murmur   . Coronary artery disease   . Type II diabetes mellitus   . Blind left eye   . DJD (degenerative joint disease)   . Arthritis     "knees, back, probably left hand" (01/14/2015)  . History of gout   . Depression   . Stroke ~ 08/2014    "blind in left eye; weak on left side since" (01/14/2015)    Past Surgical History  Procedure Laterality Date  . Abdominal hysterectomy    . Robotic assisted laparoscopic sacrocolpopexy  09/2009    Dr. Matilde Sprang  . Cataract extraction w/ intraocular lens  implant, bilateral Bilateral 2012  . Tee without cardioversion N/A 09/02/2014    Procedure: TRANSESOPHAGEAL ECHOCARDIOGRAM (TEE);  Surgeon: Lelon Perla, MD;  Location: [facility]  ENDOSCOPY;  Service: Cardiovascular;  Laterality: N/A;  . Loop recorder implant N/A 09/03/2014    Procedure: LOOP RECORDER IMPLANT;  Surgeon: Thompson Grayer, MD;  Location: [facility] CATH LAB;  Service: Cardiovascular;  Laterality: N/A;  . Cardiac catheterization N/A 01/14/2015    Procedure: Left Heart Cath and Coronary Angiography;  Surgeon: Charolette Forward, MD;  Location: [facility] CV LAB;  Service: Cardiovascular;  Laterality: N/A;  . Cardiac catheterization  ~ 2011  . Dilation and curettage of uterus  "probably"  . Coronary angioplasty      There were no vitals filed for this visit.  Visit Diagnosis:  Abnormality of gait  Unsteadiness  Left hemiparesis      Subjective Assessment - 02/14/15 1146    Subjective Pt denied falls or changes since last visits. Pt reported she hasn't done her HEP since last visit, and would like to review HEP.   Pertinent History DM, HTN, peripheral neuropathy   Patient Stated Goals improve use of L UE and improve balance.   Currently in Pain? Yes   Pain Score 6    Pain Location Leg  lower leg   Pain Orientation Right   Pain Descriptors / Indicators Burning   Pain Type Acute pain   Pain Onset 1 to 4 weeks ago   Aggravating Factors  walking long distances  Pain Relieving Factors rest     Therex and Neuro re-ed: Pt reviewed and performed HEP with frequent VC's and tactile cues for proper technique. Please see pt instructions for details. Pt also performed wall squats with 5 second holds, x10. Cues and demonstration for technique and to hold for 5 seconds.                           PT Education - 02/14/15 1228    Education provided Yes   Education Details Reviewed strengthening and balance HEP, as pt reported she was having difficulty performing HEP at home.    Person(s) Educated Patient   Methods Explanation;Demonstration;Handout;Tactile cues;Verbal cues   Comprehension Verbalized understanding;Returned demonstration;Need further  instruction          PT Short Term Goals - 02/14/15 1231    PT SHORT TERM GOAL #1   Title The patient will be able to perform HEP with assist from family.   Baseline Target date 03/06/2015   Time 4   Period Weeks   Status On-going   PT SHORT TERM GOAL #2   Title The patient will improve Berg score from 33/56 to > or equal to 40/56 to demo decreased risk for fall.   Baseline Target date 03/06/2015   Time 4   Period Weeks   Status On-going   PT SHORT TERM GOAL #3   Title The patient will improve ambulation speed from 1.98 ft/sec to > or equal to 2.4 ft/sec to demo improving mobility.   Baseline Target date 03/06/2015   Time 4   Period Weeks   Status On-going   PT SHORT TERM GOAL #4   Title The patient will negotiate 4 steps with close supervision with bilateral handrails and step to pattern.   Baseline Target date 03/06/2015   Time 4   Period Weeks   Status On-going           PT Long Term Goals - 02/14/15 1232    PT LONG TERM GOAL #1   Title The patient will improve functional status score from 50% up to > or equal to 62% for improved perception of disability.   Baseline Target date 04/05/2015   Time 8   Period Weeks   Status On-going   PT LONG TERM GOAL #2   Title The patient will improve Berg score from 33/56 to > or equal to 43/56 to demo decreasing risk for falls.   Baseline Target date 04/05/2015   Time 8   Period Weeks   Status On-going   PT LONG TERM GOAL #3   Title The patient will improve endurance by increasing ambulation distance from 200 ft nonstop to > than or equal to 400 ft nonstop.   Baseline Target date 04/05/2015   Time 8   Period Weeks   Status On-going   PT LONG TERM GOAL #4   Title The patient will improve gait speed from 1.98 ft/sec to > or equal to 2.62 ft/sec to demo transition to full community ambulator classification of gait.   Baseline Target date 04/05/2015   Time 8   Period Weeks   Status On-going               Plan -  02/14/15 1229    Clinical Impression Statement Pt demonstrated proper heel raises and tandem ambulation at counter, with frequent VC's and tactile cues. Pt required increased time to allow for processing. Pt's L hip abduction  MMT: 2/5 and R hip abduction MMT: 2+/5. Pt unable to perform clamshells, without constant cues, so PT kept hooklying hip abd. HEP the same. Pt may benefit from speech eval to address cognitive deficits, will check with primary PT. Continue with POC.   Pt will benefit from skilled therapeutic intervention in order to improve on the following deficits Abnormal gait;Decreased balance;Difficulty walking;Decreased endurance;Decreased strength   Rehab Potential Good   PT Frequency 2x / week   PT Duration 8 weeks  may decr. to 1x/week for 4 weeks   PT Treatment/Interventions ADLs/Self Care Home Management;Stair training;Functional mobility training;Patient/family education;Therapeutic activities;Therapeutic exercise;Gait training;Neuromuscular re-education;Balance training   PT Next Visit Plan Gait training with cognitive tasks and dynamic gait/balance training.   Recommended Other Services Pt may benefit from speech eval to evaluate cognitive deficits.   Consulted and Agree with Plan of Care Patient        Problem List Patient Active Problem List   Diagnosis Date Noted  . New-onset angina 01/14/2015  . Left homonymous hemianopsia 09/06/2014  . Occipital infarction 09/05/2014  . AKI (acute kidney injury)   . Acute ischemic right MCA stroke 08/30/2014  . Acute right PCA stroke 08/30/2014  . Abnormal chest x-ray 08/22/2014  . Type 2 diabetes mellitus with diabetic neuropathy 01/04/2014  . HYPERCHOLESTEROLEMIA, MILD 05/22/2009  . PERS HX NONCOMPLIANCE W/MED TX PRS HAZARDS HLTH 07/11/2007  . Essential hypertension 05/04/2007  . Osteoarthritis 05/04/2007  . Fibromyalgia 05/04/2007    Jearld Hemp L 02/14/2015, 12:35 PM  [facility] [address] [place], [place], [address] Phone: [phone]   Fax:  [phone]     Geoffry Paradise, PT,DPT 02/14/2015 12:35 PM Phone: [phone] Fax: [phone]

## 2015-02-14 NOTE — Patient Instructions (Signed)
Bridging   Slowly raise buttocks from floor, keeping stomach tight. Hold for 5 seconds. Repeat _10___ times per set. Do _1-2___ sessions per day.  http://orth.exer.us/1096   Copyright  VHI. All rights reserved.  Strengthening: Hip Abductor - Resisted   Lying down with red band looped around both legs above knees, push thighs apart. Hold 5 seconds. Repeat 10__ times per set. Do __1-2__ sessions per day.  http://orth.exer.us/688   Copyright  VHI. All rights reserved.  EXTENSION: Prone - Knee Flexed (Active)   Lie on stomach, right knee bent to 90. Lift leg toward ceiling. Repeat for 10 reps. Repeat with other leg. 1-2 sessions a day.  http://gtsc.exer.us/67   Copyright  VHI. All rights reserved.  Functional Quadriceps: Sit to Stand   Sit on edge of chair, feet flat on floor. Stand upright, extending knees fully. Repeat __10__ times per set. Do _1-2___ sessions per day.  http://orth.exer.us/735   Copyright  VHI. All rights reserved.    "I love a Database administrator   At counter for balance as needed: high knee marching forward and then backward. 3 second pauses with each knee lift.  Repeat 3 laps each way. Do _1-2_ sessions per day. http://gt2.exer.us/345   Copyright  VHI. All rights reserved.  Side-Stepping   At counter for balance as needed: Walk to left side with eyes open. Take even steps, leading with same foot. Make sure each foot lifts off the floor. Repeat in opposite direction. Keep feet pointed toward counter. Repeat for 3 laps each way. Do _1-2__ sessions per day.  Copyright  VHI. All rights reserved.  Walking on Heels   At counter: Walk on heels forward while continuing on a straight path, and then walk on heels backward to starting position. Repeat for 3 laps each way. Do _1-2__ sessions per day.  Copyright  VHI. All rights reserved.  Walking on Toes   At counter for balance as needed: Walk on toes forward while continuing on a straight  path, and then backwards on toes to starting position. Repeat 3 laps each way. Do _1-2___ sessions per day.  Copyright  VHI. All rights reserved.  Feet Heel-Toe "Tandem"   At counter: Arms at sides, walk a straight line forward bringing one foot directly in front of the other, and then a straight line backwards bringing one foot directly behind the other one.  Repeat for _3 laps each way. Do _1-2_ sessions per day.  Copyright  VHI. All rights reserved.

## 2015-02-17 ENCOUNTER — Encounter: Payer: Medicare HMO | Admitting: Internal Medicine

## 2015-02-17 LAB — CUP PACEART REMOTE DEVICE CHECK: Date Time Interrogation Session: 20160829105756

## 2015-02-18 ENCOUNTER — Ambulatory Visit: Payer: Medicare HMO | Admitting: Rehabilitative and Restorative Service Providers"

## 2015-02-19 ENCOUNTER — Other Ambulatory Visit: Payer: Self-pay | Admitting: Geriatric Medicine

## 2015-02-19 MED ORDER — ATORVASTATIN CALCIUM 20 MG PO TABS: 20.0000 mg | ORAL_TABLET | Freq: Every day | ORAL | Status: AC

## 2015-02-21 ENCOUNTER — Telehealth: Payer: Self-pay | Admitting: *Deleted

## 2015-02-21 NOTE — Telephone Encounter (Signed)
Called patient to review tachy episode from 01/10/15 seen on LINQ transmission.  Kayla [name] denies experiencing any symptoms related to this episode and states that overall she is feeling well.  Encouraged patient to call with worsening symptoms, questions, or concerns.  Patient voices understanding.

## 2015-02-25 ENCOUNTER — Ambulatory Visit: Payer: Medicare HMO | Admitting: Occupational Therapy

## 2015-02-25 ENCOUNTER — Ambulatory Visit
Payer: Medicare HMO | Attending: Physical Medicine & Rehabilitation | Admitting: Rehabilitative and Restorative Service Providers"

## 2015-02-25 DIAGNOSIS — H539 Unspecified visual disturbance: Secondary | ICD-10-CM | POA: Insufficient documentation

## 2015-02-25 DIAGNOSIS — R279 Unspecified lack of coordination: Secondary | ICD-10-CM | POA: Insufficient documentation

## 2015-02-25 DIAGNOSIS — I6931 Cognitive deficits following cerebral infarction: Secondary | ICD-10-CM | POA: Diagnosis present

## 2015-02-25 DIAGNOSIS — M25642 Stiffness of left hand, not elsewhere classified: Secondary | ICD-10-CM | POA: Diagnosis present

## 2015-02-25 DIAGNOSIS — R269 Unspecified abnormalities of gait and mobility: Secondary | ICD-10-CM | POA: Insufficient documentation

## 2015-02-25 DIAGNOSIS — IMO0002 Reserved for concepts with insufficient information to code with codable children: Secondary | ICD-10-CM

## 2015-02-25 DIAGNOSIS — I69319 Unspecified symptoms and signs involving cognitive functions following cerebral infarction: Secondary | ICD-10-CM

## 2015-02-25 DIAGNOSIS — G8190 Hemiplegia, unspecified affecting unspecified side: Secondary | ICD-10-CM | POA: Insufficient documentation

## 2015-02-25 DIAGNOSIS — R531 Weakness: Secondary | ICD-10-CM | POA: Insufficient documentation

## 2015-02-25 DIAGNOSIS — H53462 Homonymous bilateral field defects, left side: Secondary | ICD-10-CM | POA: Insufficient documentation

## 2015-02-25 DIAGNOSIS — G8194 Hemiplegia, unspecified affecting left nondominant side: Secondary | ICD-10-CM

## 2015-02-25 DIAGNOSIS — I6980 Unspecified sequelae of other cerebrovascular disease: Secondary | ICD-10-CM | POA: Insufficient documentation

## 2015-02-25 NOTE — Therapy (Signed)
[facility] [address] [place], [place], [address] Phone: [phone]   Fax:  [phone]  Physical Therapy Treatment  Patient Details  Name: Kayla [name] MRN: [id number] Date of Birth: 04-20-1941 Referring Provider:  Olga Millers, MD  Encounter Date: 02/25/2015      PT End of Session - 02/25/15 1408    Visit Number 4   Number of Visits 16   Date for PT Re-Evaluation 04/05/15   Authorization Type G code every 10th visit, copay   PT Start Time 1115   PT Stop Time 1148   PT Time Calculation (min) 33 min   Equipment Utilized During Treatment Gait belt   Activity Tolerance Patient tolerated treatment well   Behavior During Therapy Flat affect      Past Medical History  Diagnosis Date  . Asthmatic bronchitis   . Hypertension   . Venous insufficiency   . Hypercholesteremia   . Diverticulosis of colon   . Constipation   . Prolapse of vaginal vault after hysterectomy   . Proteinuria   . Fibromyalgia   . Somatic dysfunction   . Anxiety   . Personal history of noncompliance with medical treatment, presenting hazards to health   . Sickle-cell trait   . Allergy     Shell fish  . GERD (gastroesophageal reflux disease)   . Heart murmur   . Coronary artery disease   . Type II diabetes mellitus   . Blind left eye   . DJD (degenerative joint disease)   . Arthritis     "knees, back, probably left hand" (01/14/2015)  . History of gout   . Depression   . Stroke ~ 08/2014    "blind in left eye; weak on left side since" (01/14/2015)    Past Surgical History  Procedure Laterality Date  . Abdominal hysterectomy    . Robotic assisted laparoscopic sacrocolpopexy  09/2009    Dr. Matilde Sprang  . Cataract extraction w/ intraocular lens  implant, bilateral Bilateral 2012  . Tee without cardioversion N/A 09/02/2014    Procedure: TRANSESOPHAGEAL ECHOCARDIOGRAM (TEE);  Surgeon: Lelon Perla, MD;  Location: [facility]  ENDOSCOPY;  Service: Cardiovascular;  Laterality: N/A;  . Loop recorder implant N/A 09/03/2014    Procedure: LOOP RECORDER IMPLANT;  Surgeon: Thompson Grayer, MD;  Location: [facility] CATH LAB;  Service: Cardiovascular;  Laterality: N/A;  . Cardiac catheterization N/A 01/14/2015    Procedure: Left Heart Cath and Coronary Angiography;  Surgeon: Charolette Forward, MD;  Location: [facility] CV LAB;  Service: Cardiovascular;  Laterality: N/A;  . Cardiac catheterization  ~ 2011  . Dilation and curettage of uterus  "probably"  . Coronary angioplasty      There were no vitals filed for this visit.  Visit Diagnosis:  Abnormality of gait  Generalized weakness      Subjective Assessment - 02/25/15 1403    Subjective The patient reports she is not doing her HEP.   Pertinent History DM, HTN, peripheral neuropathy   Patient Stated Goals improve use of L UE and improve balance.   Currently in Pain? No/denies     The patient arrived late to appointment today.  Gait: Ambulation emphasizing longer stride length, heel strike with CGA with occasional cues to avoid obstacles on the left side x 230 feet x 2 reps Dynamic gait activities walking on level surfaces with head turns horizontal to have patient begin scanning environment  x 100 feet with CGA. Backwards walking with CGA x  10 feet x 3 reps  NEUROMUSCULAR RE-EDUCATION: Standing marching in the parallel bars with wider base of support decreasing UE support Standing heel raises and toe raises decreasing UE support for balance control Standing UE reaching for trunk rotation and stability during standing tasks Step-ups emphasizing single limb stance control and stability in LE with one UE support Standing alternating LE foot taps to 6" step without UE support with CGA for safety        PT Short Term Goals - 02/14/15 1231    PT SHORT TERM GOAL #1   Title The patient will be able to perform HEP with assist from family.   Baseline Target date 03/06/2015    Time 4   Period Weeks   Status On-going   PT SHORT TERM GOAL #2   Title The patient will improve Berg score from 33/56 to > or equal to 40/56 to demo decreased risk for fall.   Baseline Target date 03/06/2015   Time 4   Period Weeks   Status On-going   PT SHORT TERM GOAL #3   Title The patient will improve ambulation speed from 1.98 ft/sec to > or equal to 2.4 ft/sec to demo improving mobility.   Baseline Target date 03/06/2015   Time 4   Period Weeks   Status On-going   PT SHORT TERM GOAL #4   Title The patient will negotiate 4 steps with close supervision with bilateral handrails and step to pattern.   Baseline Target date 03/06/2015   Time 4   Period Weeks   Status On-going           PT Long Term Goals - 02/14/15 1232    PT LONG TERM GOAL #1   Title The patient will improve functional status score from 50% up to > or equal to 62% for improved perception of disability.   Baseline Target date 04/05/2015   Time 8   Period Weeks   Status On-going   PT LONG TERM GOAL #2   Title The patient will improve Berg score from 33/56 to > or equal to 43/56 to demo decreasing risk for falls.   Baseline Target date 04/05/2015   Time 8   Period Weeks   Status On-going   PT LONG TERM GOAL #3   Title The patient will improve endurance by increasing ambulation distance from 200 ft nonstop to > than or equal to 400 ft nonstop.   Baseline Target date 04/05/2015   Time 8   Period Weeks   Status On-going   PT LONG TERM GOAL #4   Title The patient will improve gait speed from 1.98 ft/sec to > or equal to 2.62 ft/sec to demo transition to full community ambulator classification of gait.   Baseline Target date 04/05/2015   Time 8   Period Weeks   Status On-going               Plan - 02/25/15 1409    Clinical Impression Statement The patient is limited in carryover to home reporting she doesn't have help to do HEP.  She reports she can she fine, but needs occasional cues to avoid  obstacles on her left side during gait.   PT Next Visit Plan gait training, dynamic balance activities, LE strengthening, hip abductor strengthening.   Consulted and Agree with Plan of Care Patient        Problem List Patient Active Problem List   Diagnosis Date Noted  . New-onset angina 01/14/2015  .  Left homonymous hemianopsia 09/06/2014  . Occipital infarction 09/05/2014  . AKI (acute kidney injury)   . Acute ischemic right MCA stroke 08/30/2014  . Acute right PCA stroke 08/30/2014  . Abnormal chest x-ray 08/22/2014  . Type 2 diabetes mellitus with diabetic neuropathy 01/04/2014  . HYPERCHOLESTEROLEMIA, MILD 05/22/2009  . PERS HX NONCOMPLIANCE W/MED TX PRS HAZARDS HLTH 07/11/2007  . Essential hypertension 05/04/2007  . Osteoarthritis 05/04/2007  . Fibromyalgia 05/04/2007    Issa Kosmicki, PT 02/25/2015, 2:21 PM  [facility] [address] [place], [place], [address] Phone: [phone]   Fax:  [phone]

## 2015-02-25 NOTE — Therapy (Signed)
[facility] [address] [place], [place], [address] Phone: [phone]   Fax:  [phone]  Occupational Therapy Treatment  Patient Details  Name: Kayla [name] MRN: [id number] Date of Birth: 06-01-1941 Referring Provider:  Olga Millers, MD  Encounter Date: 02/25/2015      OT End of Session - 02/25/15 1201    Visit Number 3   Number of Visits 17   Date for OT Re-Evaluation 04/04/15   Authorization Type [employer], no visit limit/auth, G-code needed   Authorization - Visit Number 3   Authorization - Number of Visits 10   OT Start Time 1154   OT Stop Time 1232   OT Time Calculation (min) 38 min   Activity Tolerance Patient tolerated treatment well   Behavior During Therapy Flat affect      Past Medical History  Diagnosis Date  . Asthmatic bronchitis   . Hypertension   . Venous insufficiency   . Hypercholesteremia   . Diverticulosis of colon   . Constipation   . Prolapse of vaginal vault after hysterectomy   . Proteinuria   . Fibromyalgia   . Somatic dysfunction   . Anxiety   . Personal history of noncompliance with medical treatment, presenting hazards to health   . Sickle-cell trait   . Allergy     Shell fish  . GERD (gastroesophageal reflux disease)   . Heart murmur   . Coronary artery disease   . Type II diabetes mellitus   . Blind left eye   . DJD (degenerative joint disease)   . Arthritis     "knees, back, probably left hand" (01/14/2015)  . History of gout   . Depression   . Stroke ~ 08/2014    "blind in left eye; weak on left side since" (01/14/2015)    Past Surgical History  Procedure Laterality Date  . Abdominal hysterectomy    . Robotic assisted laparoscopic sacrocolpopexy  09/2009    Dr. Matilde Sprang  . Cataract extraction w/ intraocular lens  implant, bilateral Bilateral 2012  . Tee without cardioversion N/A 09/02/2014    Procedure: TRANSESOPHAGEAL ECHOCARDIOGRAM (TEE);   Surgeon: Lelon Perla, MD;  Location: [facility] ENDOSCOPY;  Service: Cardiovascular;  Laterality: N/A;  . Loop recorder implant N/A 09/03/2014    Procedure: LOOP RECORDER IMPLANT;  Surgeon: Thompson Grayer, MD;  Location: [facility] CATH LAB;  Service: Cardiovascular;  Laterality: N/A;  . Cardiac catheterization N/A 01/14/2015    Procedure: Left Heart Cath and Coronary Angiography;  Surgeon: Charolette Forward, MD;  Location: [facility] CV LAB;  Service: Cardiovascular;  Laterality: N/A;  . Cardiac catheterization  ~ 2011  . Dilation and curettage of uterus  "probably"  . Coronary angioplasty      There were no vitals filed for this visit.  Visit Diagnosis:  Visual disturbance  Left homonymous hemianopsia  Cognitive deficits following cerebral infarction  Lack of coordination due to stroke  Left hemiparesis      Subjective Assessment - 02/25/15 1200    Subjective  Pt reports things are going "ok" at home   Pertinent History CVA 08/19/14, hospitalization in 7/16 for cardiac procedure, see epic snapshot   Limitations hard of hearing, L homonymous hemianopsia   Patient Stated Goals Use LUE more   Currently in Pain? No/denies                      OT Treatments/Exercises (OP) - 02/25/15 0001    Visual/Perceptual  Exercises   Copy this Image Pegboard   Pegboard copied small peg design with mod cues and significant incr time.  Pt unable to complete due to time constraints, but pt did not complete anything on L side.  Used L hand to put pegs in with min-mod difficulty for coordination.   Letter Search 30M size with initial min-mod cues with 91% for first letter cancellation sheet and 81% for 2nd.                    OT Short Term Goals - 02/03/15 1808    OT SHORT TERM GOAL #1   Title Pt will be independent with initial HEP.--check STGs 03/05/15   Time 4   Period Weeks   Status New   OT SHORT TERM GOAL #2   Title Pt will perform tabletop visual scanning with at least 90% using  compensation strategies prn without cues.   Baseline 75% with min cues   Time 4   Period Weeks   Status New   OT SHORT TERM GOAL #3   Title Pt will improve coordination for ADLs as shown by improving time on 9-hole peg test by at least 10sec with LUE.   Baseline 75.10sec   Time 4   Period Weeks   Status New   OT SHORT TERM GOAL #4   Title Pt will demo at least 90% gross finger flex for grasping objects with LUE.   Baseline 75%   Time 4   Period Weeks   Status New   OT SHORT TERM GOAL #5   Title Pt will be able to complete fasteners mod I consistently.   Time 4   Period Weeks   Status New           OT Long Term Goals - 02/03/15 1811    OT LONG TERM GOAL #1   Title Pt will be independent with updated HEP.--check LTGs 04/04/15   Time 8   Period Weeks   Status New   OT LONG TERM GOAL #2   Title Pt will verbalize understanding of cognitive compensation strategies for ADLs prn.   Time 8   Period Weeks   Status New   OT LONG TERM GOAL #3   Title Pt will perform simple environmental scanning with at least 90% accuracy for improved safety.   Time 8   Period Weeks   Status New   OT LONG TERM GOAL #4   Title Pt will improve coordination for ADLs as shown by improving time on 9-hole peg test by at least 20sec with LUE.   Baseline 75.10sec   Time 8   Period Weeks   Status New   OT LONG TERM GOAL #5   Title Pt will demo at least 15lbs L grip strength to assist in opening containers.   Baseline 0lbs   Time 8   Period Weeks   Status New   Long Term Additional Goals   Additional Long Term Goals Yes   OT LONG TERM GOAL #6   Title Pt will perform simple snack prep/meal prep with supervision.   Baseline not performing   Time 8   Period Weeks   Status New               Plan - 02/25/15 1256    Clinical Impression Statement Visual scanning appears better today for simple, orgnanized scanning, but pt demo incr difficulty with mod difficult tabletop scanning.   Plan  visual HEP, coordination  Consulted and Agree with Plan of Care Patient        Problem List Patient Active Problem List   Diagnosis Date Noted  . New-onset angina 01/14/2015  . Left homonymous hemianopsia 09/06/2014  . Occipital infarction 09/05/2014  . AKI (acute kidney injury)   . Acute ischemic right MCA stroke 08/30/2014  . Acute right PCA stroke 08/30/2014  . Abnormal chest x-ray 08/22/2014  . Type 2 diabetes mellitus with diabetic neuropathy 01/04/2014  . HYPERCHOLESTEROLEMIA, MILD 05/22/2009  . PERS HX NONCOMPLIANCE W/MED TX PRS HAZARDS HLTH 07/11/2007  . Essential hypertension 05/04/2007  . Osteoarthritis 05/04/2007  . Fibromyalgia 05/04/2007    [facility] 02/25/2015, 12:58 PM  [facility] [address] [place], [place], [address] Phone: [phone]   Fax:  [place], OTR/L 02/25/2015 12:59 PM   '

## 2015-02-26 ENCOUNTER — Ambulatory Visit: Payer: Medicare HMO | Admitting: Rehabilitative and Restorative Service Providers"

## 2015-02-26 ENCOUNTER — Ambulatory Visit: Payer: Medicare HMO | Admitting: Occupational Therapy

## 2015-02-26 NOTE — Patient Instructions (Signed)
  Patient Instructions     Bridging   Slowly raise buttocks from floor, keeping stomach tight. Hold for 5 seconds. Repeat _10___ times per set. Do _1-2___ sessions per day.  http://orth.exer.us/1096   Copyright  VHI. All rights reserved.  Strengthening: Hip Abductor - Resisted   Lying down with red band looped around both legs above knees, push thighs apart. Hold 5 seconds. Repeat 10__ times per set. Do __1-2__ sessions per day.  http://orth.exer.us/688   Copyright  VHI. All rights reserved.  EXTENSION: Prone - Knee Flexed (Active)   Lie on stomach, right knee bent to 90. Lift leg toward ceiling. Repeat for 10 reps. Repeat with other leg. 1-2 sessions a day.  http://gtsc.exer.us/67   Copyright  VHI. All rights reserved.  Functional Quadriceps: Sit to Stand   Sit on edge of chair, feet flat on floor. Stand upright, extending knees fully. Repeat __10__ times per set. Do _1-2___ sessions per day.  http://orth.exer.us/735   Copyright  VHI. All rights reserved.    "I love a Database administrator   At counter for balance as needed: high knee marching forward and then backward. 3 second pauses with each knee lift.  Repeat 3 laps each way. Do _1-2_ sessions per day. http://[email or web address].exer.us/345   Copyright  VHI. All rights reserved.  Side-Stepping   At counter for balance as needed: Walk to left side with eyes open. Take even steps, leading with same foot. Make sure each foot lifts off the floor. Repeat in opposite direction. Keep feet pointed toward counter. Repeat for 3 laps each way. Do _1-2__ sessions per day.  Copyright  VHI. All rights reserved.  Walking on Heels   At counter: Walk on heels forward while continuing on a straight path, and then walk on heels backward to starting position. Repeat for 3 laps each way. Do _1-2__ sessions per day.  Copyright  VHI. All rights reserved.  Walking on Toes   At counter for balance as needed: Walk on toes forward  while continuing on a straight path, and then backwards on toes to starting position. Repeat 3 laps each way. Do _1-2___ sessions per day.  Copyright  VHI. All rights reserved.  Feet Heel-Toe "Tandem"   At counter: Arms at sides, walk a straight line forward bringing one foot directly in front of the other, and then a straight line backwards bringing one foot directly behind the other one.  Repeat for _3 laps each way. Do _1-2_ sessions per day.  Copyright  VHI. All rights reserved.

## 2015-03-03 ENCOUNTER — Ambulatory Visit (INDEPENDENT_AMBULATORY_CARE_PROVIDER_SITE_OTHER): Payer: Medicare HMO | Admitting: *Deleted

## 2015-03-03 DIAGNOSIS — I639 Cerebral infarction, unspecified: Secondary | ICD-10-CM | POA: Diagnosis not present

## 2015-03-04 ENCOUNTER — Ambulatory Visit: Payer: Medicare HMO | Admitting: Rehabilitative and Restorative Service Providers"

## 2015-03-04 ENCOUNTER — Ambulatory Visit: Payer: Medicare HMO | Admitting: Occupational Therapy

## 2015-03-04 NOTE — Progress Notes (Signed)
Loop recorder 

## 2015-03-05 ENCOUNTER — Ambulatory Visit: Payer: Medicare HMO | Admitting: Physical Therapy

## 2015-03-05 ENCOUNTER — Ambulatory Visit: Payer: Medicare HMO | Admitting: Occupational Therapy

## 2015-03-10 ENCOUNTER — Encounter: Payer: Self-pay | Admitting: Occupational Therapy

## 2015-03-10 ENCOUNTER — Ambulatory Visit: Payer: Medicare HMO | Admitting: Occupational Therapy

## 2015-03-10 ENCOUNTER — Ambulatory Visit: Payer: Medicare HMO | Admitting: Rehabilitative and Restorative Service Providers"

## 2015-03-10 DIAGNOSIS — H539 Unspecified visual disturbance: Secondary | ICD-10-CM

## 2015-03-10 DIAGNOSIS — G8194 Hemiplegia, unspecified affecting left nondominant side: Secondary | ICD-10-CM

## 2015-03-10 DIAGNOSIS — R531 Weakness: Secondary | ICD-10-CM

## 2015-03-10 DIAGNOSIS — IMO0002 Reserved for concepts with insufficient information to code with codable children: Secondary | ICD-10-CM

## 2015-03-10 DIAGNOSIS — R269 Unspecified abnormalities of gait and mobility: Secondary | ICD-10-CM

## 2015-03-10 DIAGNOSIS — I69319 Unspecified symptoms and signs involving cognitive functions following cerebral infarction: Secondary | ICD-10-CM

## 2015-03-10 DIAGNOSIS — M25642 Stiffness of left hand, not elsewhere classified: Secondary | ICD-10-CM

## 2015-03-10 DIAGNOSIS — H53462 Homonymous bilateral field defects, left side: Secondary | ICD-10-CM

## 2015-03-10 NOTE — Therapy (Signed)
[facility] [address] [place], [place], [address] Phone: [phone]   Fax:  [phone]  Occupational Therapy Treatment  Patient Details  Name: Kayla [name] MRN: [id number] Date of Birth: 1941-01-03 Referring Provider:  Olga Millers, MD  Encounter Date: 03/10/2015      OT End of Session - 03/10/15 1111    Visit Number 4   Number of Visits 17   Date for OT Re-Evaluation 04/04/15   Authorization Type [employer], no visit limit/auth, G-code needed   Authorization - Visit Number 4   Authorization - Number of Visits 10   OT Start Time 1100   OT Stop Time 1145   OT Time Calculation (min) 45 min   Activity Tolerance Patient tolerated treatment well   Behavior During Therapy [facility] for tasks assessed/performed      Past Medical History  Diagnosis Date  . Asthmatic bronchitis   . Hypertension   . Venous insufficiency   . Hypercholesteremia   . Diverticulosis of colon   . Constipation   . Prolapse of vaginal vault after hysterectomy   . Proteinuria   . Fibromyalgia   . Somatic dysfunction   . Anxiety   . Personal history of noncompliance with medical treatment, presenting hazards to health   . Sickle-cell trait   . Allergy     Shell fish  . GERD (gastroesophageal reflux disease)   . Heart murmur   . Coronary artery disease   . Type II diabetes mellitus   . Blind left eye   . DJD (degenerative joint disease)   . Arthritis     "knees, back, probably left hand" (01/14/2015)  . History of gout   . Depression   . Stroke ~ 08/2014    "blind in left eye; weak on left side since" (01/14/2015)    Past Surgical History  Procedure Laterality Date  . Abdominal hysterectomy    . Robotic assisted laparoscopic sacrocolpopexy  09/2009    Dr. Matilde Sprang  . Cataract extraction w/ intraocular lens  implant, bilateral Bilateral 2012  . Tee without cardioversion N/A 09/02/2014    Procedure: TRANSESOPHAGEAL  ECHOCARDIOGRAM (TEE);  Surgeon: Lelon Perla, MD;  Location: [facility] ENDOSCOPY;  Service: Cardiovascular;  Laterality: N/A;  . Loop recorder implant N/A 09/03/2014    Procedure: LOOP RECORDER IMPLANT;  Surgeon: Thompson Grayer, MD;  Location: [facility] CATH LAB;  Service: Cardiovascular;  Laterality: N/A;  . Cardiac catheterization N/A 01/14/2015    Procedure: Left Heart Cath and Coronary Angiography;  Surgeon: Charolette Forward, MD;  Location: [facility] CV LAB;  Service: Cardiovascular;  Laterality: N/A;  . Cardiac catheterization  ~ 2011  . Dilation and curettage of uterus  "probably"  . Coronary angioplasty      There were no vitals filed for this visit.  Visit Diagnosis:  Visual disturbance  Left homonymous hemianopsia  Cognitive deficits following cerebral infarction  Lack of coordination due to stroke  Left hemiparesis  Stiffness of left hand joint      Subjective Assessment - 03/10/15 1109    Subjective  "I can't see"   Patient is accompained by: Family member   Pertinent History CVA 08/19/14, hospitalization in 7/16 for cardiac procedure, see epic snapshot   Limitations hard of hearing, L homonymous hemianopsia   Patient Stated Goals Use LUE more   Currently in Pain? No/denies  OT Treatments/Exercises (OP) - 03/10/15 0001    ADLs   Overall ADLs Emphasized importance of performing HEP and being seen consistently for progress.  Checked 9-hole peg test-see goals section for score.   Visual/Perceptual Exercises   Other Exercises Matching clock faces to digital times by performing visual scanning on table top with digital times placed to the L to encourage L visual scanning/awareness.  Pt needed mod cues and incr time due to missing items on the L and min cues to determine correct time.                  OT Education - 03/10/15 1259    Education Details Re-issued Coordination HEP as pt has not been performing (but did not review due to time  constraints), Instructed pt in visual compensation strategies/HEP (see pt instructions)   Person(s) Educated Patient;Spouse   Methods Explanation;Demonstration;Verbal cues;Handout   Comprehension Verbalized understanding;Returned demonstration;Verbal cues required;Need further instruction          OT Short Term Goals - 03/10/15 1119    OT SHORT TERM GOAL #1   Title Pt will be independent with initial HEP.--check STGs 03/05/15   Time 4   Period Weeks   Status On-going   OT SHORT TERM GOAL #2   Title Pt will perform tabletop visual scanning with at least 90% using compensation strategies prn without cues.   Baseline 75% with min cues   Time 4   Period Weeks   Status On-going   OT SHORT TERM GOAL #3   Title Pt will improve coordination for ADLs as shown by improving time on 9-hole peg test by at least 10sec with LUE.   Baseline 75.10sec   Time 4   Period Weeks   Status On-going  03/10/15:  103.47sec   OT SHORT TERM GOAL #4   Title Pt will demo at least 90% gross finger flex for grasping objects with LUE.   Baseline 75%   Time 4   Period Weeks   Status On-going   OT SHORT TERM GOAL #5   Title Pt will be able to complete fasteners mod I consistently.   Time 4   Period Weeks   Status On-going           OT Long Term Goals - 02/03/15 1811    OT LONG TERM GOAL #1   Title Pt will be independent with updated HEP.--check LTGs 04/04/15   Time 8   Period Weeks   Status New   OT LONG TERM GOAL #2   Title Pt will verbalize understanding of cognitive compensation strategies for ADLs prn.   Time 8   Period Weeks   Status New   OT LONG TERM GOAL #3   Title Pt will perform simple environmental scanning with at least 90% accuracy for improved safety.   Time 8   Period Weeks   Status New   OT LONG TERM GOAL #4   Title Pt will improve coordination for ADLs as shown by improving time on 9-hole peg test by at least 20sec with LUE.   Baseline 75.10sec   Time 8   Period Weeks    Status New   OT LONG TERM GOAL #5   Title Pt will demo at least 15lbs L grip strength to assist in opening containers.   Baseline 0lbs   Time 8   Period Weeks   Status New   Long Term Additional Goals   Additional Long Term Goals Yes  OT LONG TERM GOAL #6   Title Pt will perform simple snack prep/meal prep with supervision.   Baseline not performing   Time 8   Period Weeks   Status New               Plan - 03/10/15 1122    Clinical Impression Statement Pt continues to demo decreased scanning to L side and attention to L side.  Pt seen decr frequency due to pt being out of town/scheduling.  Pt would benefit from ongoing therapy to address goals per original POC.  Check STGs next visit as pt seen decr frequency.   Plan review HEPs, check STGs   Consulted and Agree with Plan of Care Patient;Family member/caregiver   Family Member Consulted husband        Problem List Patient Active Problem List   Diagnosis Date Noted  . New-onset angina 01/14/2015  . Left homonymous hemianopsia 09/06/2014  . Occipital infarction 09/05/2014  . AKI (acute kidney injury)   . Acute ischemic right MCA stroke 08/30/2014  . Acute right PCA stroke 08/30/2014  . Abnormal chest x-ray 08/22/2014  . Type 2 diabetes mellitus with diabetic neuropathy 01/04/2014  . HYPERCHOLESTEROLEMIA, MILD 05/22/2009  . PERS HX NONCOMPLIANCE W/MED TX PRS HAZARDS HLTH 07/11/2007  . Essential hypertension 05/04/2007  . Osteoarthritis 05/04/2007  . Fibromyalgia 05/04/2007    [facility] 03/10/2015, 1:04 PM  [facility] [address] [place], [place], [address] Phone: [phone]   Fax:  [place], OTR/L 03/10/2015 1:04 PM

## 2015-03-10 NOTE — Therapy (Signed)
[facility] [address] [place], [place], [address] Phone: [phone]   Fax:  [phone]  Physical Therapy Treatment  Patient Details  Name: Kayla [name] MRN: [id number] Date of Birth: 1941/05/27 Referring Provider:  Olga Millers, MD  Encounter Date: 03/10/2015      PT End of Session - 03/10/15 1435    Visit Number 5   Number of Visits 16   Date for PT Re-Evaluation 04/05/15   Authorization Type G code every 10th visit, copay   PT Start Time 1018   PT Stop Time 1102   PT Time Calculation (min) 44 min   Equipment Utilized During Treatment Gait belt   Activity Tolerance Patient tolerated treatment well   Behavior During Therapy Flat affect      Past Medical History  Diagnosis Date  . Asthmatic bronchitis   . Hypertension   . Venous insufficiency   . Hypercholesteremia   . Diverticulosis of colon   . Constipation   . Prolapse of vaginal vault after hysterectomy   . Proteinuria   . Fibromyalgia   . Somatic dysfunction   . Anxiety   . Personal history of noncompliance with medical treatment, presenting hazards to health   . Sickle-cell trait   . Allergy     Shell fish  . GERD (gastroesophageal reflux disease)   . Heart murmur   . Coronary artery disease   . Type II diabetes mellitus   . Blind left eye   . DJD (degenerative joint disease)   . Arthritis     "knees, back, probably left hand" (01/14/2015)  . History of gout   . Depression   . Stroke ~ 08/2014    "blind in left eye; weak on left side since" (01/14/2015)    Past Surgical History  Procedure Laterality Date  . Abdominal hysterectomy    . Robotic assisted laparoscopic sacrocolpopexy  09/2009    Dr. Matilde Sprang  . Cataract extraction w/ intraocular lens  implant, bilateral Bilateral 2012  . Tee without cardioversion N/A 09/02/2014    Procedure: TRANSESOPHAGEAL ECHOCARDIOGRAM (TEE);  Surgeon: Lelon Perla, MD;  Location: [facility]  ENDOSCOPY;  Service: Cardiovascular;  Laterality: N/A;  . Loop recorder implant N/A 09/03/2014    Procedure: LOOP RECORDER IMPLANT;  Surgeon: Thompson Grayer, MD;  Location: [facility] CATH LAB;  Service: Cardiovascular;  Laterality: N/A;  . Cardiac catheterization N/A 01/14/2015    Procedure: Left Heart Cath and Coronary Angiography;  Surgeon: Charolette Forward, MD;  Location: [facility] CV LAB;  Service: Cardiovascular;  Laterality: N/A;  . Cardiac catheterization  ~ 2011  . Dilation and curettage of uterus  "probably"  . Coronary angioplasty      There were no vitals filed for this visit.  Visit Diagnosis:  Abnormality of gait  Generalized weakness      Subjective Assessment - 03/10/15 1020    Subjective The patient reports she is doing some exercise at home. The patient things she is making some progress, but not as fast as she wants.   Patient Stated Goals improve use of L UE and improve balance.   Currently in Pain? Yes   Pain Score 7    Pain Location Leg  lower leg   Pain Orientation Right   Pain Descriptors / Indicators Burning   Pain Type Acute pain   Pain Onset More than a month ago   Aggravating Factors  walking longer distances   Pain Relieving Factors rest  [facility] PT Assessment - 03/10/15 1037    Ambulation/Gait   Ambulation/Gait Yes.   Gait velocity 2.23 ft/sec   Stairs Yes   Stairs Assistance 5: Supervision   Stair Management Technique Two rails;Alternating pattern   Number of Stairs --  8   Standardized Balance Assessment   Standardized Balance Assessment Berg Balance Test   Berg Balance Test   Sit to Stand Able to stand without using hands and stabilize independently   Standing Unsupported Able to stand safely 2 minutes   Sitting with Back Unsupported but Feet Supported on Floor or Stool Able to sit safely and securely 2 minutes   Stand to Sit Sits safely with minimal use of hands   Transfers Able to transfer safely, minor use of hands   Standing  Unsupported with Eyes Closed Able to stand 10 seconds safely   Standing Ubsupported with Feet Together Able to place feet together independently and stand 1 minute safely   From Standing, Reach Forward with Outstretched Arm Can reach forward >12 cm safely (5")   From Standing Position, Pick up Object from Floor Able to pick up shoe, needs supervision   From Standing Position, Turn to Look Behind Over each Shoulder Looks behind from both sides and weight shifts well   Turn 360 Degrees Able to turn 360 degrees safely but slowly   Standing Unsupported, Alternately Place Feet on Step/Stool Able to complete 4 steps without aid or supervision   Standing Unsupported, One Foot in Front Able to take small step independently and hold 30 seconds   Standing on One Leg Tries to lift leg/unable to hold 3 seconds but remains standing independently   Total Score 45   Berg comment: Pt scores 45/56 improved from 33/56.                     [facility] Adult PT Treatment/Exercise - 03/10/15 1037    Ambulation/Gait   Ambulation/Gait Assistance 5: Supervision   Ambulation Distance (Feet) 230 Feet  x 4 reps with cues on heel strike and knee ext terminal swing   Ambulation Surface Level   High Level Balance   High Level Balance Activities Marching forwards;Backward walking  with  min A for safety/support   High Level Balance Comments sit<>stand without UEs emphasizing big posture and no UEs for transition.   Self-Care   Self-Care --  discussed compliance with HEP as key factor in progress   Exercises   Exercises Other Exercises   Other Exercises  seated hamstring and heel cord stretch, thomas test hip flexor stretch  sci-fit x 5 minutes UE/LEs at level 3                  PT Short Term Goals - 03/10/15 1024    PT SHORT TERM GOAL #1   Title The patient will be able to perform HEP with assist from family.   Baseline Patient has HEP, but not performing regularly.   Time 4   Period Weeks    Status Partially Met   PT SHORT TERM GOAL #2   Title The patient will improve Berg score from 33/56 to > or equal to 40/56 to demo decreased risk for fall.   Baseline Met on 03/10/2015 with patient scoring 45/56.   Time 4   Period Weeks   Status Achieved   PT SHORT TERM GOAL #3   Title The patient will improve ambulation speed from 1.98 ft/sec to > or equal to 2.4 ft/sec  to demo improving mobility.   Baseline Pt improved to 2.23 ft/sec for gait speed on 03/10/2015   Time 4   Period Weeks   Status Partially Met   PT SHORT TERM GOAL #4   Title The patient will negotiate 4 steps with close supervision with bilateral handrails and step to pattern.   Baseline Pt performs reciprocal pattern with one handrail and 4 steps with supervision   Time 4   Period Weeks   Status Achieved           PT Long Term Goals - 02/14/15 1232    PT LONG TERM GOAL #1   Title The patient will improve functional status score from 50% up to > or equal to 62% for improved perception of disability.   Baseline Target date 04/05/2015   Time 8   Period Weeks   Status On-going   PT LONG TERM GOAL #2   Title The patient will improve Berg score from 33/56 to > or equal to 43/56 to demo decreasing risk for falls.   Baseline Target date 04/05/2015   Time 8   Period Weeks   Status On-going   PT LONG TERM GOAL #3   Title The patient will improve endurance by increasing ambulation distance from 200 ft nonstop to > than or equal to 400 ft nonstop.   Baseline Target date 04/05/2015   Time 8   Period Weeks   Status On-going   PT LONG TERM GOAL #4   Title The patient will improve gait speed from 1.98 ft/sec to > or equal to 2.62 ft/sec to demo transition to full community ambulator classification of gait.   Baseline Target date 04/05/2015   Time 8   Period Weeks   Status On-going               Plan - 03/10/15 1435    Clinical Impression Statement The patient met 2 STGs and partially met the other 2  showing progress on giat speed and intermittent HEP performance.  PT to continue to LTGs.   PT Next Visit Plan gait training, dynamic balance activities, LE strengthening, hip abductor strengthening.   Consulted and Agree with Plan of Care Patient;Family member/caregiver   Family Member Consulted spouse        Problem List Patient Active Problem List   Diagnosis Date Noted  . New-onset angina 01/14/2015  . Left homonymous hemianopsia 09/06/2014  . Occipital infarction 09/05/2014  . AKI (acute kidney injury)   . Acute ischemic right MCA stroke 08/30/2014  . Acute right PCA stroke 08/30/2014  . Abnormal chest x-ray 08/22/2014  . Type 2 diabetes mellitus with diabetic neuropathy 01/04/2014  . HYPERCHOLESTEROLEMIA, MILD 05/22/2009  . PERS HX NONCOMPLIANCE W/MED TX PRS HAZARDS HLTH 07/11/2007  . Essential hypertension 05/04/2007  . Osteoarthritis 05/04/2007  . Fibromyalgia 05/04/2007    WEAVER,CHRISTINA, PT 03/10/2015, 2:37 PM  [facility] [address] [place], [place], [address] Phone: [phone]   Fax:  [phone]

## 2015-03-10 NOTE — Patient Instructions (Addendum)
Compensation strategies: 1. Look for the edge of objects (to the left and/or right) so that you make sure you are seeing all of an object 2. Turn your head when walking, scan from side to side, particularly in busy environments and have someone with you for safety. 3. Use an organized scanning pattern. It's usually easier to scan from top to bottom, and left to right (like you are reading) 4. Double check yourself 5. Use a line guide (like a blank piece of paper) or your finger when reading 6. If necessary, place brightly colored tape at end of table or work area as a reminder to always look until you see the tape.  7. Large print will be easier to see. 8. Organize items in baskets and/or with labels so they are easier to see/find 9. Remove clutter 10. Make sure you have good lighting. 11. Consider using contrast/brightly colored strips on steps to make it easier to see the edge.   Visual Activities: 1. Simple word search 2. Read simple paragraphs/bible verses out loud 3. With someone with you, look for items in grocery store 4. Work simple jigsaw puzzles 5. Play simple matching games online/tablet that make you search visually. 6.  Play board/card games (memory/matching, solitaire, connect 4, etc.)    Coordination Activities  Perform the following activities for 20 minutes 1-2 times per day with left hand(s).   Rotate ball in fingertips (clockwise and counter-clockwise).  Toss ball in air and catch with the same hand.  Flip cards 1 at a time   Deal cards with your thumb (Hold deck in hand and push card off top with thumb).  Pick up coins, buttons, beads, marbles, paperclip, key, dried beans/pasta (anything small of different sizes/shapes) and place in container. Try not to drag it to the edge of the table.  Pick up coins and place in container or coin bank.  Squeeze yellow putty with your whole hand.  Roll out yellow putty into log/snake and pinch with each  finger/thumb down length of putty.  Place coins or beads into yellow putty and then remove using just your left hand.

## 2015-03-12 ENCOUNTER — Ambulatory Visit: Payer: Medicare HMO | Admitting: Rehabilitative and Restorative Service Providers"

## 2015-03-12 ENCOUNTER — Encounter: Payer: Self-pay | Admitting: Occupational Therapy

## 2015-03-12 ENCOUNTER — Ambulatory Visit: Payer: Medicare HMO | Admitting: Occupational Therapy

## 2015-03-12 DIAGNOSIS — R269 Unspecified abnormalities of gait and mobility: Secondary | ICD-10-CM

## 2015-03-12 DIAGNOSIS — H539 Unspecified visual disturbance: Secondary | ICD-10-CM

## 2015-03-12 DIAGNOSIS — H53462 Homonymous bilateral field defects, left side: Secondary | ICD-10-CM

## 2015-03-12 DIAGNOSIS — R531 Weakness: Secondary | ICD-10-CM

## 2015-03-12 DIAGNOSIS — IMO0002 Reserved for concepts with insufficient information to code with codable children: Secondary | ICD-10-CM

## 2015-03-12 NOTE — Therapy (Signed)
[facility] [address] [place], [place], [address] Phone: [phone]   Fax:  [phone]  Physical Therapy Treatment  Patient Details  Name: Kayla [name] MRN: [id number] Date of Birth: 12/20/1940 Referring Provider:  Olga Millers, MD  Encounter Date: 03/12/2015      PT End of Session - 03/12/15 1048    Visit Number 6   Number of Visits 16   Date for PT Re-Evaluation 04/05/15   Authorization Type G code every 10th visit, copay   PT Start Time 1020   PT Stop Time 1100   PT Time Calculation (min) 40 min   Equipment Utilized During Treatment Gait belt   Activity Tolerance Patient tolerated treatment well   Behavior During Therapy Flat affect      Past Medical History  Diagnosis Date  . Asthmatic bronchitis   . Hypertension   . Venous insufficiency   . Hypercholesteremia   . Diverticulosis of colon   . Constipation   . Prolapse of vaginal vault after hysterectomy   . Proteinuria   . Fibromyalgia   . Somatic dysfunction   . Anxiety   . Personal history of noncompliance with medical treatment, presenting hazards to health   . Sickle-cell trait   . Allergy     Shell fish  . GERD (gastroesophageal reflux disease)   . Heart murmur   . Coronary artery disease   . Type II diabetes mellitus   . Blind left eye   . DJD (degenerative joint disease)   . Arthritis     "knees, back, probably left hand" (01/14/2015)  . History of gout   . Depression   . Stroke ~ 08/2014    "blind in left eye; weak on left side since" (01/14/2015)    Past Surgical History  Procedure Laterality Date  . Abdominal hysterectomy    . Robotic assisted laparoscopic sacrocolpopexy  09/2009    Dr. Matilde Sprang  . Cataract extraction w/ intraocular lens  implant, bilateral Bilateral 2012  . Tee without cardioversion N/A 09/02/2014    Procedure: TRANSESOPHAGEAL ECHOCARDIOGRAM (TEE);  Surgeon: Lelon Perla, MD;  Location: [facility]  ENDOSCOPY;  Service: Cardiovascular;  Laterality: N/A;  . Loop recorder implant N/A 09/03/2014    Procedure: LOOP RECORDER IMPLANT;  Surgeon: Thompson Grayer, MD;  Location: [facility] CATH LAB;  Service: Cardiovascular;  Laterality: N/A;  . Cardiac catheterization N/A 01/14/2015    Procedure: Left Heart Cath and Coronary Angiography;  Surgeon: Charolette Forward, MD;  Location: [place] CV LAB;  Service: Cardiovascular;  Laterality: N/A;  . Cardiac catheterization  ~ 2011  . Dilation and curettage of uterus  "probably"  . Coronary angioplasty      There were no vitals filed for this visit.  Visit Diagnosis:  Abnormality of gait  Generalized weakness      Subjective Assessment - 03/12/15 1023    Subjective The patient reports she was sore after last session.   Pertinent History DM, HTN, peripheral neuropathy   Patient Stated Goals improve use of L UE and improve balance.   Currently in Pain? No/denies           [facility] Adult PT Treatment/Exercise - 03/12/15 1029    Ambulation/Gait   Ambulation/Gait Yes   Ambulation/Gait Assistance 5: Supervision   Ambulation/Gait Assistance Details cues due to L visual field cut   Ambulation Distance (Feet) 575 Feet   Assistive device --  no device   Gait Pattern Decreased step length -  left  with knee flexion   Gait Comments Heel and toe walking with CGA to min A for support x 40 feet each.   Neuro Re-ed    Neuro Re-ed Details  The patient performed sidestepping in the parallel bars decreasing UE support, marching with min A, sit<>stand with overhead reaching to engage core muscles.   Exercises   Exercises Other Exercises   Other Exercises  seated hamstring and heel cord stretch, mini squats x 10 reps with UE support           PT Short Term Goals - 03/10/15 1024    PT SHORT TERM GOAL #1   Title The patient will be able to perform HEP with assist from family.   Baseline Patient has HEP, but not performing regularly.   Time 4   Period Weeks    Status Partially Met   PT SHORT TERM GOAL #2   Title The patient will improve Berg score from 33/56 to > or equal to 40/56 to demo decreased risk for fall.   Baseline Met on 03/10/2015 with patient scoring 45/56.   Time 4   Period Weeks   Status Achieved   PT SHORT TERM GOAL #3   Title The patient will improve ambulation speed from 1.98 ft/sec to > or equal to 2.4 ft/sec to demo improving mobility.   Baseline Pt improved to 2.23 ft/sec for gait speed on 03/10/2015   Time 4   Period Weeks   Status Partially Met   PT SHORT TERM GOAL #4   Title The patient will negotiate 4 steps with close supervision with bilateral handrails and step to pattern.   Baseline Pt performs reciprocal pattern with one handrail and 4 steps with supervision   Time 4   Period Weeks   Status Achieved           PT Long Term Goals - 02/14/15 1232    PT LONG TERM GOAL #1   Title The patient will improve functional status score from 50% up to > or equal to 62% for improved perception of disability.   Baseline Target date 04/05/2015   Time 8   Period Weeks   Status On-going   PT LONG TERM GOAL #2   Title The patient will improve Berg score from 33/56 to > or equal to 43/56 to demo decreasing risk for falls.   Baseline Target date 04/05/2015   Time 8   Period Weeks   Status On-going   PT LONG TERM GOAL #3   Title The patient will improve endurance by increasing ambulation distance from 200 ft nonstop to > than or equal to 400 ft nonstop.   Baseline Target date 04/05/2015   Time 8   Period Weeks   Status On-going   PT LONG TERM GOAL #4   Title The patient will improve gait speed from 1.98 ft/sec to > or equal to 2.62 ft/sec to demo transition to full community ambulator classification of gait.   Baseline Target date 04/05/2015   Time 8   Period Weeks   Status On-going               Plan - 03/12/15 1058    Clinical Impression Statement The patient is progressing with ambulation with focus on L  visual scanning due to field cut (homonymous hemianopsia and ?inattention) as patient is reporting running into items at home that are on the left, which is causing her to lose her balance.   PT Next  Visit Plan gait training with visual scanning, dynamic balance activities, LE strengthening, hip abductor strengthening.   Consulted and Agree with Plan of Care Patient        Problem List Patient Active Problem List   Diagnosis Date Noted  . New-onset angina 01/14/2015  . Left homonymous hemianopsia 09/06/2014  . Occipital infarction 09/05/2014  . AKI (acute kidney injury)   . Acute ischemic right MCA stroke 08/30/2014  . Acute right PCA stroke 08/30/2014  . Abnormal chest x-ray 08/22/2014  . Type 2 diabetes mellitus with diabetic neuropathy 01/04/2014  . HYPERCHOLESTEROLEMIA, MILD 05/22/2009  . PERS HX NONCOMPLIANCE W/MED TX PRS HAZARDS HLTH 07/11/2007  . Essential hypertension 05/04/2007  . Osteoarthritis 05/04/2007  . Fibromyalgia 05/04/2007    WEAVER,CHRISTINA, PT 03/12/2015, 11:21 AM  [facility] [address] [place], [place], [address] Phone: [phone]   Fax:  [phone]

## 2015-03-12 NOTE — Therapy (Signed)
[facility] [address] [place], [place], [address] Phone: [phone]   Fax:  [phone]  Occupational Therapy Treatment  Patient Details  Name: Kayla [name] MRN: [id number] Date of Birth: 10-Jul-1940 Referring Provider:  Olga Millers, MD  Encounter Date: 03/12/2015      OT End of Session - 03/12/15 1020    Visit Number 5   Number of Visits 17   Date for OT Re-Evaluation 04/04/15   Authorization Type [employer] Medicare, no visit limit/auth, G-code needed   Authorization - Visit Number 5   Authorization - Number of Visits 10   OT Start Time 0932   OT Stop Time 1015   OT Time Calculation (min) 43 min   Activity Tolerance Patient tolerated treatment well      Past Medical History  Diagnosis Date  . Asthmatic bronchitis   . Hypertension   . Venous insufficiency   . Hypercholesteremia   . Diverticulosis of colon   . Constipation   . Prolapse of vaginal vault after hysterectomy   . Proteinuria   . Fibromyalgia   . Somatic dysfunction   . Anxiety   . Personal history of noncompliance with medical treatment, presenting hazards to health   . Sickle-cell trait   . Allergy     Shell fish  . GERD (gastroesophageal reflux disease)   . Heart murmur   . Coronary artery disease   . Type II diabetes mellitus   . Blind left eye   . DJD (degenerative joint disease)   . Arthritis     "knees, back, probably left hand" (01/14/2015)  . History of gout   . Depression   . Stroke ~ 08/2014    "blind in left eye; weak on left side since" (01/14/2015)    Past Surgical History  Procedure Laterality Date  . Abdominal hysterectomy    . Robotic assisted laparoscopic sacrocolpopexy  09/2009    Dr. Matilde Sprang  . Cataract extraction w/ intraocular lens  implant, bilateral Bilateral 2012  . Tee without cardioversion N/A 09/02/2014    Procedure: TRANSESOPHAGEAL ECHOCARDIOGRAM (TEE);  Surgeon: Lelon Perla, MD;  Location:  [facility] ENDOSCOPY;  Service: Cardiovascular;  Laterality: N/A;  . Loop recorder implant N/A 09/03/2014    Procedure: LOOP RECORDER IMPLANT;  Surgeon: Thompson Grayer, MD;  Location: [facility] CATH LAB;  Service: Cardiovascular;  Laterality: N/A;  . Cardiac catheterization N/A 01/14/2015    Procedure: Left Heart Cath and Coronary Angiography;  Surgeon: Charolette Forward, MD;  Location: [place] CV LAB;  Service: Cardiovascular;  Laterality: N/A;  . Cardiac catheterization  ~ 2011  . Dilation and curettage of uterus  "probably"  . Coronary angioplasty      There were no vitals filed for this visit.  Visit Diagnosis:  Lack of coordination due to stroke  Visual disturbance  Left homonymous hemianopsia      Subjective Assessment - 03/12/15 0944    Subjective  "I've cooked some eggs on the stove" (Therapist advised pt not to cook on stove, only cold prep, microwaveable)   Pertinent History CVA 08/19/14, hospitalization in 7/16 for cardiac procedure, see epic snapshot   Limitations hard of hearing, L homonymous hemianopsia   Patient Stated Goals Use LUE more   Currently in Pain? No/denies                      OT Treatments/Exercises (OP) - 03/12/15 0001    ADLs   ADL Comments Reviewed  and assessed STG's. Reviewed visual strategies and activities to practice at home and in community for visual scanning (issued last session)   Exercises   Exercises --  Reviewed coordination and putty HEP. Pt return demo of each                  OT Short Term Goals - 03/12/15 1012    OT SHORT TERM GOAL #1   Title Pt will be independent with initial HEP.--check STGs 03/05/15   Time 4   Period Weeks   Status Achieved   OT SHORT TERM GOAL #2   Title Pt will perform tabletop visual scanning with at least 90% using compensation strategies prn without cues.   Baseline 75% with min cues   Time 4   Period Weeks   Status On-going   OT SHORT TERM GOAL #3   Title Pt will improve coordination for  ADLs as shown by improving time on 9-hole peg test by at least 10sec with LUE.   Baseline 75.10sec   Time 4   Period Weeks   Status On-going  03/10/15:  103.47sec   OT SHORT TERM GOAL #4   Title Pt will demo at least 90% gross finger flex for grasping objects with LUE.   Baseline 75%   Time 4   Period Weeks   Status Achieved   OT SHORT TERM GOAL #5   Title Pt will be able to complete fasteners mod I consistently.   Time 4   Period Weeks   Status Achieved  per pt report, does with difficulty           OT Long Term Goals - 02/03/15 1811    OT LONG TERM GOAL #1   Title Pt will be independent with updated HEP.--check LTGs 04/04/15   Time 8   Period Weeks   Status New   OT LONG TERM GOAL #2   Title Pt will verbalize understanding of cognitive compensation strategies for ADLs prn.   Time 8   Period Weeks   Status New   OT LONG TERM GOAL #3   Title Pt will perform simple environmental scanning with at least 90% accuracy for improved safety.   Time 8   Period Weeks   Status New   OT LONG TERM GOAL #4   Title Pt will improve coordination for ADLs as shown by improving time on 9-hole peg test by at least 20sec with LUE.   Baseline 75.10sec   Time 8   Period Weeks   Status New   OT LONG TERM GOAL #5   Title Pt will demo at least 15lbs L grip strength to assist in opening containers.   Baseline 0lbs   Time 8   Period Weeks   Status New   Long Term Additional Goals   Additional Long Term Goals Yes   OT LONG TERM GOAL #6   Title Pt will perform simple snack prep/meal prep with supervision.   Baseline not performing   Time 8   Period Weeks   Status New               Plan - 03/12/15 1014    Clinical Impression Statement Pt met STG'S #1, #4, and #5 (per pt report) today. Ongoing for STG's #2 and #3 (not yet met). Pt with decreased memory and hard of hearing which impairs carry-over of HEP. Emphasized importance of doing HEP's at home and reviewed with her today.     Plan Continue  towards unmet STG's and LTG's.    Consulted and Agree with Plan of Care Patient        Problem List Patient Active Problem List   Diagnosis Date Noted  . New-onset angina 01/14/2015  . Left homonymous hemianopsia 09/06/2014  . Occipital infarction 09/05/2014  . AKI (acute kidney injury)   . Acute ischemic right MCA stroke 08/30/2014  . Acute right PCA stroke 08/30/2014  . Abnormal chest x-ray 08/22/2014  . Type 2 diabetes mellitus with diabetic neuropathy 01/04/2014  . HYPERCHOLESTEROLEMIA, MILD 05/22/2009  . PERS HX NONCOMPLIANCE W/MED TX PRS HAZARDS HLTH 07/11/2007  . Essential hypertension 05/04/2007  . Osteoarthritis 05/04/2007  . Fibromyalgia 05/04/2007    Carey Bullocks, OTR/L 03/12/2015, 10:24 AM  [facility] [address] [place], [place], [address] Phone: [phone]   Fax:  [phone]

## 2015-03-13 LAB — CUP PACEART REMOTE DEVICE CHECK: Date Time Interrogation Session: 20160912154438

## 2015-03-13 NOTE — Progress Notes (Signed)
Carelink summary report received. Battery status OK. Normal device function. No new symptom episodes, tachy episodes, brady, or pause episodes. No new AF episodes. Monthly summary reports and ROV with JA PRN. 

## 2015-03-17 ENCOUNTER — Ambulatory Visit: Payer: Medicare HMO | Admitting: Occupational Therapy

## 2015-03-17 ENCOUNTER — Ambulatory Visit: Payer: Medicare HMO | Admitting: Rehabilitative and Restorative Service Providers"

## 2015-03-17 DIAGNOSIS — H53462 Homonymous bilateral field defects, left side: Secondary | ICD-10-CM

## 2015-03-17 DIAGNOSIS — R269 Unspecified abnormalities of gait and mobility: Secondary | ICD-10-CM | POA: Diagnosis not present

## 2015-03-17 DIAGNOSIS — R531 Weakness: Secondary | ICD-10-CM

## 2015-03-17 DIAGNOSIS — H539 Unspecified visual disturbance: Secondary | ICD-10-CM

## 2015-03-17 DIAGNOSIS — IMO0002 Reserved for concepts with insufficient information to code with codable children: Secondary | ICD-10-CM

## 2015-03-17 DIAGNOSIS — G8194 Hemiplegia, unspecified affecting left nondominant side: Secondary | ICD-10-CM

## 2015-03-17 DIAGNOSIS — I69319 Unspecified symptoms and signs involving cognitive functions following cerebral infarction: Secondary | ICD-10-CM

## 2015-03-17 NOTE — Therapy (Signed)
[facility] [address] [place], [place], [address] Phone: [phone]   Fax:  [phone]  Occupational Therapy Treatment  Patient Details  Name: Kayla [name] MRN: [id number] Date of Birth: May 28, 1941 Referring Provider:  Olga Millers, MD  Encounter Date: 03/17/2015      OT End of Session - 03/17/15 0943    Visit Number 6   Number of Visits 17   Date for OT Re-Evaluation 04/04/15   Authorization Type [employer] Medicare, no visit limit/auth, G-code needed   Authorization - Visit Number 6   Authorization - Number of Visits 10   OT Start Time [phone]   OT Stop Time 1015   OT Time Calculation (min) 41 min   Activity Tolerance Patient tolerated treatment well   Behavior During Therapy [facility] for tasks assessed/performed      Past Medical History  Diagnosis Date  . Asthmatic bronchitis   . Hypertension   . Venous insufficiency   . Hypercholesteremia   . Diverticulosis of colon   . Constipation   . Prolapse of vaginal vault after hysterectomy   . Proteinuria   . Fibromyalgia   . Somatic dysfunction   . Anxiety   . Personal history of noncompliance with medical treatment, presenting hazards to health   . Sickle-cell trait   . Allergy     Shell fish  . GERD (gastroesophageal reflux disease)   . Heart murmur   . Coronary artery disease   . Type II diabetes mellitus   . Blind left eye   . DJD (degenerative joint disease)   . Arthritis     "knees, back, probably left hand" (01/14/2015)  . History of gout   . Depression   . Stroke ~ 08/2014    "blind in left eye; weak on left side since" (01/14/2015)    Past Surgical History  Procedure Laterality Date  . Abdominal hysterectomy    . Robotic assisted laparoscopic sacrocolpopexy  09/2009    Dr. Matilde Sprang  . Cataract extraction w/ intraocular lens  implant, bilateral Bilateral 2012  . Tee without cardioversion N/A 09/02/2014    Procedure: TRANSESOPHAGEAL  ECHOCARDIOGRAM (TEE);  Surgeon: Lelon Perla, MD;  Location: [facility] ENDOSCOPY;  Service: Cardiovascular;  Laterality: N/A;  . Loop recorder implant N/A 09/03/2014    Procedure: LOOP RECORDER IMPLANT;  Surgeon: Thompson Grayer, MD;  Location: [facility] CATH LAB;  Service: Cardiovascular;  Laterality: N/A;  . Cardiac catheterization N/A 01/14/2015    Procedure: Left Heart Cath and Coronary Angiography;  Surgeon: Charolette Forward, MD;  Location: [facility] CV LAB;  Service: Cardiovascular;  Laterality: N/A;  . Cardiac catheterization  ~ 2011  . Dilation and curettage of uterus  "probably"  . Coronary angioplasty      There were no vitals filed for this visit.  Visit Diagnosis:  Left hemiparesis  Left homonymous hemianopsia  Visual disturbance  Lack of coordination due to stroke  Cognitive deficits following cerebral infarction      Subjective Assessment - 03/17/15 0940    Subjective  "I should have brought my reading glasses"   Pertinent History CVA 08/19/14, hospitalization in 7/16 for cardiac procedure, see epic snapshot   Limitations hard of hearing, L homonymous hemianopsia   Patient Stated Goals Use LUE more   Currently in Pain? No/denies                      OT Treatments/Exercises (OP) - 03/17/15 0001  Exercises   Exercises Hand   Hand Exercises   Hand Gripper with Medium Beads Picking up blocks using 15lbs sustained grip strength with max difficulty/drops.  Pt only able to pick up 8-9.   Other Hand Exercises removing/replacing clothespins with 1-2lbs resistance for incr strength.  Pt initially missed 2 on the L side and needed min-mod cues.   Visual/Perceptual Exercises   Letter Search 30M size with approx 94% accuracy, pt with max difficulty with smaller size, but did not bring glasses today.   Modalities   Modalities Fluidotherapy   LUE Fluidotherapy   Number Minutes Fluidotherapy 8 Minutes   LUE Fluidotherapy Location Hand;Wrist   Comments with no adverse  reactions due to stiffness   Manual Therapy   Manual Therapy Passive ROM   Passive ROM to fingers in flex (isolated joint and composite) followed by place and holds in composite flex after Fludio                OT Education - 03/17/15 0942    Education Details Encouraged pt to bring reading glasses to therapy   Person(s) Educated Patient   Methods Explanation   Comprehension Verbalized understanding          OT Short Term Goals - 03/12/15 1012    OT SHORT TERM GOAL #1   Title Pt will be independent with initial HEP.--check STGs 03/05/15   Time 4   Period Weeks   Status Achieved   OT SHORT TERM GOAL #2   Title Pt will perform tabletop visual scanning with at least 90% using compensation strategies prn without cues.   Baseline 75% with min cues   Time 4   Period Weeks   Status On-going   OT SHORT TERM GOAL #3   Title Pt will improve coordination for ADLs as shown by improving time on 9-hole peg test by at least 10sec with LUE.   Baseline 75.10sec   Time 4   Period Weeks   Status On-going  03/10/15:  103.47sec   OT SHORT TERM GOAL #4   Title Pt will demo at least 90% gross finger flex for grasping objects with LUE.   Baseline 75%   Time 4   Period Weeks   Status Achieved   OT SHORT TERM GOAL #5   Title Pt will be able to complete fasteners mod I consistently.   Time 4   Period Weeks   Status Achieved  per pt report, does with difficulty           OT Long Term Goals - 02/03/15 1811    OT LONG TERM GOAL #1   Title Pt will be independent with updated HEP.--check LTGs 04/04/15   Time 8   Period Weeks   Status New   OT LONG TERM GOAL #2   Title Pt will verbalize understanding of cognitive compensation strategies for ADLs prn.   Time 8   Period Weeks   Status New   OT LONG TERM GOAL #3   Title Pt will perform simple environmental scanning with at least 90% accuracy for improved safety.   Time 8   Period Weeks   Status New   OT LONG TERM GOAL #4    Title Pt will improve coordination for ADLs as shown by improving time on 9-hole peg test by at least 20sec with LUE.   Baseline 75.10sec   Time 8   Period Weeks   Status New   OT LONG TERM GOAL #5  Title Pt will demo at least 15lbs L grip strength to assist in opening containers.   Baseline 0lbs   Time 8   Period Weeks   Status New   Long Term Additional Goals   Additional Long Term Goals Yes   OT LONG TERM GOAL #6   Title Pt will perform simple snack prep/meal prep with supervision.   Baseline not performing   Time 8   Period Weeks   Status New               Plan - 03/17/15 1543    Clinical Impression Statement Pt progressing slowly towards goals.  Pt demo L finger flex WFL today after stretching, but L hand continues to be very weak and pt continues to miss items on L side during functional tasks (but improved within small range).   Plan continue to address L side awareness, vision, weakness, and coordination   Consulted and Agree with Plan of Care Patient        Problem List Patient Active Problem List   Diagnosis Date Noted  . New-onset angina 01/14/2015  . Left homonymous hemianopsia 09/06/2014  . Occipital infarction 09/05/2014  . AKI (acute kidney injury)   . Acute ischemic right MCA stroke 08/30/2014  . Acute right PCA stroke 08/30/2014  . Abnormal chest x-ray 08/22/2014  . Type 2 diabetes mellitus with diabetic neuropathy 01/04/2014  . HYPERCHOLESTEROLEMIA, MILD 05/22/2009  . PERS HX NONCOMPLIANCE W/MED TX PRS HAZARDS HLTH 07/11/2007  . Essential hypertension 05/04/2007  . Osteoarthritis 05/04/2007  . Fibromyalgia 05/04/2007    [facility] 03/17/2015, 3:46 PM  [facility] [address] [place], [place], [address] Phone: [phone]   Fax:  [place], OTR/L 03/17/2015 3:46 PM

## 2015-03-17 NOTE — Therapy (Signed)
[facility] [address] [place], [place], [address] Phone: [phone]   Fax:  [phone]  Physical Therapy Treatment  Patient Details  Name: Kayla [name] MRN: [id number] Date of Birth: 1940-07-09 Referring Milika Ventress:  Olga Millers, MD  Encounter Date: 03/17/2015      PT End of Session - 03/17/15 1331    Visit Number 7   Number of Visits 16   Date for PT Re-Evaluation 04/05/15   Authorization Type G code every 10th visit, copay   PT Start Time 1020   PT Stop Time 1102   PT Time Calculation (min) 42 min   Equipment Utilized During Treatment Gait belt   Activity Tolerance Patient tolerated treatment well   Behavior During Therapy Flat affect      Past Medical History  Diagnosis Date  . Asthmatic bronchitis   . Hypertension   . Venous insufficiency   . Hypercholesteremia   . Diverticulosis of colon   . Constipation   . Prolapse of vaginal vault after hysterectomy   . Proteinuria   . Fibromyalgia   . Somatic dysfunction   . Anxiety   . Personal history of noncompliance with medical treatment, presenting hazards to health   . Sickle-cell trait   . Allergy     Shell fish  . GERD (gastroesophageal reflux disease)   . Heart murmur   . Coronary artery disease   . Type II diabetes mellitus   . Blind left eye   . DJD (degenerative joint disease)   . Arthritis     "knees, back, probably left hand" (01/14/2015)  . History of gout   . Depression   . Stroke ~ 08/2014    "blind in left eye; weak on left side since" (01/14/2015)    Past Surgical History  Procedure Laterality Date  . Abdominal hysterectomy    . Robotic assisted laparoscopic sacrocolpopexy  09/2009    Dr. Matilde Sprang  . Cataract extraction w/ intraocular lens  implant, bilateral Bilateral 2012  . Tee without cardioversion N/A 09/02/2014    Procedure: TRANSESOPHAGEAL ECHOCARDIOGRAM (TEE);  Surgeon: Lelon Perla, MD;  Location: [facility]  ENDOSCOPY;  Service: Cardiovascular;  Laterality: N/A;  . Loop recorder implant N/A 09/03/2014    Procedure: LOOP RECORDER IMPLANT;  Surgeon: Thompson Grayer, MD;  Location: [facility] CATH LAB;  Service: Cardiovascular;  Laterality: N/A;  . Cardiac catheterization N/A 01/14/2015    Procedure: Left Heart Cath and Coronary Angiography;  Surgeon: Charolette Forward, MD;  Location: [facility] CV LAB;  Service: Cardiovascular;  Laterality: N/A;  . Cardiac catheterization  ~ 2011  . Dilation and curettage of uterus  "probably"  . Coronary angioplasty      There were no vitals filed for this visit.  Visit Diagnosis:  Abnormality of gait  Generalized weakness      Subjective Assessment - 03/17/15 1027    Subjective The patient reports she is walking more at home and notes back pain in lower region of back.    Patient Stated Goals improve use of L UE and improve balance.           [facility] Adult PT Treatment/Exercise - 03/17/15 1048    Ambulation/Gait   Ambulation/Gait Yes   Ambulation/Gait Assistance 5: Supervision   Ambulation/Gait Assistance Details cues due to L visual field cut   Ambulation Distance (Feet) 575 Feet  then 230 feet x 2 reps with cues on decreasing hip IR   Assistive device None  Gait Pattern Decreased step length - left  with knee flexion   Ambulation Surface Level;Indoor   Gait Comments focused on gait mechanics with heel strike, L knee extension at initial contact of stance phase, and decreasing knee IR/femur IR   Posture/Postural Control   Posture Comments emphasize standing posture with knees engaged vs. letting knees bend medially for support   High Level Balance   High Level Balance Activities Head turns;Other (comment);Side stepping;Backward walking   High Level Balance Comments rocker board with static balance in medial/lateral and ant/post directions adding head turns and postural cues + min A for safety, ant/post direction with rocker board with eyes closed and min A.   Added a ball overhead to sidestepping for increased core engagement requiring min A for safety, especially when fatigued.   Neuro Re-ed    Neuro Re-ed Details  L leg and then R leg stance activities with emphasis on knee/hip position and knee control including moving one LE to 6" step positioned laterally for hip rotation + quad activation; weight shift laterally in standing emphasizing knee position and core.   Exercises   Exercises Other Exercises   Other Exercises  thomas test stretch, hamstring stretch           PT Short Term Goals - 03/10/15 1024    PT SHORT TERM GOAL #1   Title The patient will be able to perform HEP with assist from family.   Baseline Patient has HEP, but not performing regularly.   Time 4   Period Weeks   Status Partially Met   PT SHORT TERM GOAL #2   Title The patient will improve Berg score from 33/56 to > or equal to 40/56 to demo decreased risk for fall.   Baseline Met on 03/10/2015 with patient scoring 45/56.   Time 4   Period Weeks   Status Achieved   PT SHORT TERM GOAL #3   Title The patient will improve ambulation speed from 1.98 ft/sec to > or equal to 2.4 ft/sec to demo improving mobility.   Baseline Pt improved to 2.23 ft/sec for gait speed on 03/10/2015   Time 4   Period Weeks   Status Partially Met   PT SHORT TERM GOAL #4   Title The patient will negotiate 4 steps with close supervision with bilateral handrails and step to pattern.   Baseline Pt performs reciprocal pattern with one handrail and 4 steps with supervision   Time 4   Period Weeks   Status Achieved           PT Long Term Goals - 02/14/15 1232    PT LONG TERM GOAL #1   Title The patient will improve functional status score from 50% up to > or equal to 62% for improved perception of disability.   Baseline Target date 04/05/2015   Time 8   Period Weeks   Status On-going   PT LONG TERM GOAL #2   Title The patient will improve Berg score from 33/56 to > or equal to 43/56 to  demo decreasing risk for falls.   Baseline Target date 04/05/2015   Time 8   Period Weeks   Status On-going   PT LONG TERM GOAL #3   Title The patient will improve endurance by increasing ambulation distance from 200 ft nonstop to > than or equal to 400 ft nonstop.   Baseline Target date 04/05/2015   Time 8   Period Weeks   Status On-going   PT LONG  TERM GOAL #4   Title The patient will improve gait speed from 1.98 ft/sec to > or equal to 2.62 ft/sec to demo transition to full community ambulator classification of gait.   Baseline Target date 04/05/2015   Time 8   Period Weeks   Status On-going               Plan - 03/17/15 1343    Clinical Impression Statement The patient has increased back pain with gait per subjective.  This may be due to knees internally rotating to probide greater stability due to hip weakness.  PT to continue focusing on LE strength, balance and safety during functional mobility.   PT Next Visit Plan hip strength, femoral ER in standing (wants to IR), dynamic balance activities, LE strengthening, gait training with L visual scanning   Consulted and Agree with Plan of Care Patient        Problem List Patient Active Problem List   Diagnosis Date Noted  . New-onset angina 01/14/2015  . Left homonymous hemianopsia 09/06/2014  . Occipital infarction 09/05/2014  . AKI (acute kidney injury)   . Acute ischemic right MCA stroke 08/30/2014  . Acute right PCA stroke 08/30/2014  . Abnormal chest x-ray 08/22/2014  . Type 2 diabetes mellitus with diabetic neuropathy 01/04/2014  . HYPERCHOLESTEROLEMIA, MILD 05/22/2009  . PERS HX NONCOMPLIANCE W/MED TX PRS HAZARDS HLTH 07/11/2007  . Essential hypertension 05/04/2007  . Osteoarthritis 05/04/2007  . Fibromyalgia 05/04/2007    WEAVER,CHRISTINA, PT 03/17/2015, 1:45 PM  [facility] [address] [place], [place], [address] Phone: [phone]    Fax:  [phone]

## 2015-03-19 ENCOUNTER — Encounter: Payer: Self-pay | Admitting: Occupational Therapy

## 2015-03-19 ENCOUNTER — Ambulatory Visit: Payer: Medicare HMO | Admitting: Occupational Therapy

## 2015-03-19 ENCOUNTER — Ambulatory Visit: Payer: Medicare HMO | Admitting: Rehabilitative and Restorative Service Providers"

## 2015-03-19 DIAGNOSIS — I69319 Unspecified symptoms and signs involving cognitive functions following cerebral infarction: Secondary | ICD-10-CM

## 2015-03-19 DIAGNOSIS — H53462 Homonymous bilateral field defects, left side: Secondary | ICD-10-CM

## 2015-03-19 DIAGNOSIS — R269 Unspecified abnormalities of gait and mobility: Secondary | ICD-10-CM | POA: Diagnosis not present

## 2015-03-19 DIAGNOSIS — R531 Weakness: Secondary | ICD-10-CM

## 2015-03-19 NOTE — Therapy (Signed)
[facility] [address] [place], [place], [address] Phone: [phone]   Fax:  [phone]  Physical Therapy Treatment  Patient Details  Name: Kayla [name] MRN: [id number] Date of Birth: 01/24/1941 Referring Provider:  Hoyt Koch, *  Encounter Date: 03/19/2015      PT End of Session - 03/19/15 1344    Visit Number 8   Number of Visits 16   Date for PT Re-Evaluation 04/05/15   Authorization Type G code every 10th visit, copay   PT Start Time 0934   PT Stop Time 1015   PT Time Calculation (min) 41 min   Equipment Utilized During Treatment Gait belt   Activity Tolerance Patient tolerated treatment well   Behavior During Therapy Flat affect      Past Medical History  Diagnosis Date  . Asthmatic bronchitis   . Hypertension   . Venous insufficiency   . Hypercholesteremia   . Diverticulosis of colon   . Constipation   . Prolapse of vaginal vault after hysterectomy   . Proteinuria   . Fibromyalgia   . Somatic dysfunction   . Anxiety   . Personal history of noncompliance with medical treatment, presenting hazards to health   . Sickle-cell trait   . Allergy     Shell fish  . GERD (gastroesophageal reflux disease)   . Heart murmur   . Coronary artery disease   . Type II diabetes mellitus   . Blind left eye   . DJD (degenerative joint disease)   . Arthritis     "knees, back, probably left hand" (01/14/2015)  . History of gout   . Depression   . Stroke ~ 08/2014    "blind in left eye; weak on left side since" (01/14/2015)    Past Surgical History  Procedure Laterality Date  . Abdominal hysterectomy    . Robotic assisted laparoscopic sacrocolpopexy  09/2009    Dr. Matilde Sprang  . Cataract extraction w/ intraocular lens  implant, bilateral Bilateral 2012  . Tee without cardioversion N/A 09/02/2014    Procedure: TRANSESOPHAGEAL ECHOCARDIOGRAM (TEE);  Surgeon: Lelon Perla, MD;  Location: [facility]  ENDOSCOPY;  Service: Cardiovascular;  Laterality: N/A;  . Loop recorder implant N/A 09/03/2014    Procedure: LOOP RECORDER IMPLANT;  Surgeon: Thompson Grayer, MD;  Location: [facility] CATH LAB;  Service: Cardiovascular;  Laterality: N/A;  . Cardiac catheterization N/A 01/14/2015    Procedure: Left Heart Cath and Coronary Angiography;  Surgeon: Charolette Forward, MD;  Location: [facility] CV LAB;  Service: Cardiovascular;  Laterality: N/A;  . Cardiac catheterization  ~ 2011  . Dilation and curettage of uterus  "probably"  . Coronary angioplasty      There were no vitals filed for this visit.  Visit Diagnosis:  Abnormality of gait  Generalized weakness      Subjective Assessment - 03/19/15 0936    Subjective The patient reports she has a little discomfort in the right leg (lateral aspect along IT band).   Patient Stated Goals improve use of L UE and improve balance.   Currently in Pain? Yes   Pain Score 5    Pain Location Leg   Pain Orientation Right   Pain Descriptors / Indicators Aching;Burning   Pain Type Chronic pain   Pain Onset More than a month ago   Aggravating Factors  walking longer distances   Pain Relieving Factors rest          [facility] Adult PT Treatment/Exercise -  03/19/15 0952    Transfers   Comments sit<>stand x 10 reps with cues on wider base of support and quad engagement when transitioning to standing   Ambulation/Gait   Ambulation/Gait Yes   Ambulation/Gait Assistance 5: Supervision;4: Min guard   Ambulation/Gait Assistance Details cues due to L visual field cut   Ambulation Distance (Feet) 400 Feet  200 x 3 reps   Assistive device None   Gait Pattern Decreased step length - left  with knee flexion   Ambulation Surface Level   Gait velocity 3.03 ft/sec   Gait Comments worked on L visual scanning during gait finding # of fingers held up to L side at various levels, gait with cues on knee extension at initial contact phase of gait   High Level Balance   High Level  Balance Activities --  weight shifting laterally on physioball   High Level Balance Comments sitting LE extension, marching, and wide to narrow marches with decreasing UE support on physioball.    Neuro Re-ed    Neuro Re-ed Details  L leg and then R leg stance activities with emphasis on knee/hip position and knee control including moving one LE to 6" step positioned laterally for hip rotation + quad activation; weight shift laterally in standing emphasizing knee position and core.  Wide base of support marching, backwards walking with cues on longer stride and emphasis on posture, standing stepping posteriorly with cues to maintain knee extension   Exercises   Exercises Other Exercises   Other Exercises  thomas test stretch, hamstring stretch, hip adductor stretch supine                  PT Short Term Goals - 03/10/15 1024    PT SHORT TERM GOAL #1   Title The patient will be able to perform HEP with assist from family.   Baseline Patient has HEP, but not performing regularly.   Time 4   Period Weeks   Status Partially Met   PT SHORT TERM GOAL #2   Title The patient will improve Berg score from 33/56 to > or equal to 40/56 to demo decreased risk for fall.   Baseline Met on 03/10/2015 with patient scoring 45/56.   Time 4   Period Weeks   Status Achieved   PT SHORT TERM GOAL #3   Title The patient will improve ambulation speed from 1.98 ft/sec to > or equal to 2.4 ft/sec to demo improving mobility.   Baseline Pt improved to 2.23 ft/sec for gait speed on 03/10/2015   Time 4   Period Weeks   Status Partially Met   PT SHORT TERM GOAL #4   Title The patient will negotiate 4 steps with close supervision with bilateral handrails and step to pattern.   Baseline Pt performs reciprocal pattern with one handrail and 4 steps with supervision   Time 4   Period Weeks   Status Achieved           PT Long Term Goals - 03/19/15 1015    PT LONG TERM GOAL #1   Title The patient will  improve functional status score from 50% up to > or equal to 62% for improved perception of disability.   Baseline Target date 04/05/2015   Time 8   Period Weeks   Status On-going   PT LONG TERM GOAL #2   Title The patient will improve Berg score from 33/56 to > or equal to 43/56 to demo decreasing risk for falls.     Baseline Target date 04/05/2015   Time 8   Period Weeks   Status On-going   PT LONG TERM GOAL #3   Title The patient will improve endurance by increasing ambulation distance from 200 ft nonstop to > than or equal to 400 ft nonstop.   Baseline Patient ambulates >500 feet nonstop in PT 03/19/2015.   Time 8   Period Weeks   Status Achieved   PT LONG TERM GOAL #4   Title The patient will improve gait speed from 1.98 ft/sec to > or equal to 2.62 ft/sec to demo transition to full community ambulator classification of gait.   Baseline Met on 03/19/2015 scoring 3.03 ft/sec.   Time 8   Period Weeks   Status Achieved               Plan - 03/19/15 1019    Clinical Impression Statement The patient met 2 LTGs.  She is progressing well with tolerance to walking, and general endurance.  She continues with limitations due to L visual field deficits.  She also has LE weakness/tightness that change mechanics of gait leading to increased pain with extended ambulation.   PT Next Visit Plan hip strength, femoral ER in standing (wants to IR), dynamic balance activities, LE strengthening, gait training with L visual scanning   Consulted and Agree with Plan of Care Patient        Problem List Patient Active Problem List   Diagnosis Date Noted  . New-onset angina 01/14/2015  . Left homonymous hemianopsia 09/06/2014  . Occipital infarction 09/05/2014  . AKI (acute kidney injury)   . Acute ischemic right MCA stroke 08/30/2014  . Acute right PCA stroke 08/30/2014  . Abnormal chest x-ray 08/22/2014  . Type 2 diabetes mellitus with diabetic neuropathy 01/04/2014  .  HYPERCHOLESTEROLEMIA, MILD 05/22/2009  . PERS HX NONCOMPLIANCE W/MED TX PRS HAZARDS HLTH 07/11/2007  . Essential hypertension 05/04/2007  . Osteoarthritis 05/04/2007  . Fibromyalgia 05/04/2007    WEAVER,CHRISTINA, PT 03/19/2015, 1:51 PM  [facility] [address] [place], [place], [address] Phone: [phone]   Fax:  [phone]      

## 2015-03-19 NOTE — Therapy (Signed)
[facility] [address] [place], [place], [address] Phone: [phone]   Fax:  [phone]  Occupational Therapy Treatment  Patient Details  Name: Kayla [name] MRN: [id number] Date of Birth: 24-Mar-1941 Referring Provider:  Hoyt Koch, *  Encounter Date: 03/19/2015      OT End of Session - 03/19/15 0954    Visit Number 7   Number of Visits 17   Date for OT Re-Evaluation 04/04/15   Authorization Type [employer], no visit limit/auth, G-code needed   Authorization - Visit Number 7   Authorization - Number of Visits 10   OT Start Time [phone]   OT Stop Time 0930   OT Time Calculation (min) 44 min   Activity Tolerance Patient tolerated treatment well   Behavior During Therapy [facility] for tasks assessed/performed      Past Medical History  Diagnosis Date  . Asthmatic bronchitis   . Hypertension   . Venous insufficiency   . Hypercholesteremia   . Diverticulosis of colon   . Constipation   . Prolapse of vaginal vault after hysterectomy   . Proteinuria   . Fibromyalgia   . Somatic dysfunction   . Anxiety   . Personal history of noncompliance with medical treatment, presenting hazards to health   . Sickle-cell trait   . Allergy     Shell fish  . GERD (gastroesophageal reflux disease)   . Heart murmur   . Coronary artery disease   . Type II diabetes mellitus   . Blind left eye   . DJD (degenerative joint disease)   . Arthritis     "knees, back, probably left hand" (01/14/2015)  . History of gout   . Depression   . Stroke ~ 08/2014    "blind in left eye; weak on left side since" (01/14/2015)    Past Surgical History  Procedure Laterality Date  . Abdominal hysterectomy    . Robotic assisted laparoscopic sacrocolpopexy  09/2009    Dr. Matilde Sprang  . Cataract extraction w/ intraocular lens  implant, bilateral Bilateral 2012  . Tee without cardioversion N/A 09/02/2014    Procedure: TRANSESOPHAGEAL  ECHOCARDIOGRAM (TEE);  Surgeon: Lelon Perla, MD;  Location: [facility] ENDOSCOPY;  Service: Cardiovascular;  Laterality: N/A;  . Loop recorder implant N/A 09/03/2014    Procedure: LOOP RECORDER IMPLANT;  Surgeon: Thompson Grayer, MD;  Location: [facility] CATH LAB;  Service: Cardiovascular;  Laterality: N/A;  . Cardiac catheterization N/A 01/14/2015    Procedure: Left Heart Cath and Coronary Angiography;  Surgeon: Charolette Forward, MD;  Location: [facility] CV LAB;  Service: Cardiovascular;  Laterality: N/A;  . Cardiac catheterization  ~ 2011  . Dilation and curettage of uterus  "probably"  . Coronary angioplasty      There were no vitals filed for this visit.  Visit Diagnosis:  Cognitive deficits following cerebral infarction  Left homonymous hemianopsia      Subjective Assessment - 03/19/15 0854    Subjective  I can't remember like I used to.     Pertinent History CVA 08/19/14, hospitalization in 7/16 for cardiac procedure, see epic snapshot   Limitations hard of hearing, L homonymous hemianopsia   Patient Stated Goals Use LUE more   Currently in Pain? Yes   Pain Score 6    Pain Location Leg   Pain Orientation Right   Pain Descriptors / Indicators Burning   Pain Type Chronic pain   Pain Onset More than a month ago  OT Treatments/Exercises (OP) - 03/19/15 0001    Cognitive Exercises   Other Cognitive Exercises 1 Worked with patient today to address sustained and selective attention.  Patient able to complete very simple scanning exercises with initial verbal instruction and only intermittent cueing.  Patient able to sustain attention to one step task for 2 minutes without difficulty in minimally distracting environment.  With increased challenge, reduced size of font, increased visual stimulation, and increased auditory distraction (busy gym) patient needed more frequent prompting to complete task.  Here scanning to left was more challenging, although greater  challenge seemed to be sustaining attention to task at hand.  Patient consistently started each exercise strong, and after 3-4 minutes, rate of errors increased.  Patient had same pattern of behavior when asked to correct errors.  Discussed with patient the improtance of sustained concentration when completing a new task.  Even related to how sustained attention could help her with balance, and safety with functional mobility.     Other Cognitive Exercises 2 Patient having difficulty managing her schedule.  Yesterday she thought she had a dentist appointment, but it was the wrong day.  Today she arrived for an appointment that had not been scheduled.  Patient with persistent back pain, and pain down back of right leg.  She is not aware of ever having discussed this with her MD - nor was she aware of any medications she took to relieve this pain. She felt it might be helpful to make an MD appointment to discuss these issues.  Helped patient to make a reminder note with her questions to discuss with the MD.  Discussed that it may be helpful to return to a method of writing down appointments in a pocket calendar for her purse.  Patient agreed.     Visual/Perceptual Exercises   Word Finding Able to scan left ot right to locate six words in simple word search.  Patient on one occasion only circled the second half of a compound word due to ineffective scanning to left as well as decreased attention to task.  Patient able to immedicately employ strategies to improve effectiveness with this task.  Will need to check carryover with future sessions.                 OT Education - 03/19/15 [phone]    Education provided Yes   Education Details using contrast to identify edge of paper, scanning to left.  Using left hand to identify borders of paper in scanning exercise, using active attention for longer periods of time to improve accuracy / reduce errors   Person(s) Educated Patient   Methods Explanation    Comprehension Verbalized understanding;Verbal cues required;Need further instruction          OT Short Term Goals - 03/12/15 1012    OT SHORT TERM GOAL #1   Title Pt will be independent with initial HEP.--check STGs 03/05/15   Time 4   Period Weeks   Status Achieved   OT SHORT TERM GOAL #2   Title Pt will perform tabletop visual scanning with at least 90% using compensation strategies prn without cues.   Baseline 75% with min cues   Time 4   Period Weeks   Status On-going   OT SHORT TERM GOAL #3   Title Pt will improve coordination for ADLs as shown by improving time on 9-hole peg test by at least 10sec with LUE.   Baseline 75.10sec   Time 4   Period Weeks  Status On-going  03/10/15:  103.47sec   OT SHORT TERM GOAL #4   Title Pt will demo at least 90% gross finger flex for grasping objects with LUE.   Baseline 75%   Time 4   Period Weeks   Status Achieved   OT SHORT TERM GOAL #5   Title Pt will be able to complete fasteners mod I consistently.   Time 4   Period Weeks   Status Achieved  per pt report, does with difficulty           OT Long Term Goals - 02/03/15 1811    OT LONG TERM GOAL #1   Title Pt will be independent with updated HEP.--check LTGs 04/04/15   Time 8   Period Weeks   Status New   OT LONG TERM GOAL #2   Title Pt will verbalize understanding of cognitive compensation strategies for ADLs prn.   Time 8   Period Weeks   Status New   OT LONG TERM GOAL #3   Title Pt will perform simple environmental scanning with at least 90% accuracy for improved safety.   Time 8   Period Weeks   Status New   OT LONG TERM GOAL #4   Title Pt will improve coordination for ADLs as shown by improving time on 9-hole peg test by at least 20sec with LUE.   Baseline 75.10sec   Time 8   Period Weeks   Status New   OT LONG TERM GOAL #5   Title Pt will demo at least 15lbs L grip strength to assist in opening containers.   Baseline 0lbs   Time 8   Period Weeks    Status New   Long Term Additional Goals   Additional Long Term Goals Yes   OT LONG TERM GOAL #6   Title Pt will perform simple snack prep/meal prep with supervision.   Baseline not performing   Time 8   Period Weeks   Status New               Plan - 03/19/15 0175    Clinical Impression Statement Patient progressing toward goals, however, with significant cognitive deficits which impede carryover.  Patient will benefit from overt instruction, and repetition to aide with carryover of therapeutic strategies for vision, balance, attentio/memory, and coordination.   Pt will benefit from skilled therapeutic intervention in order to improve on the following deficits (Retired) Decreased cognition;Decreased balance;Decreased strength;Impaired vision/preception;Impaired UE functional use;Decreased range of motion;Decreased coordination;Decreased activity tolerance   Rehab Potential Good   OT Frequency 2x / week   OT Duration 8 weeks   OT Treatment/Interventions Self-care/ADL training;Cryotherapy;Electrical Stimulation;Parrafin;Energy conservation;Manual Therapy;Visual/perceptual remediation/compensation;Cognitive remediation/compensation;Passive range of motion;Functional Mobility Training;Neuromuscular education;Ultrasound;Therapeutic exercise;Therapeutic activities;Therapeutic exercises;Moist Heat;Fluidtherapy;DME and/or AE instruction;Splinting;Patient/family education   Plan cognition - sustained to selective attention, left sided awareness - environment, and body, and coordination   Consulted and Agree with Plan of Care Patient        Problem List Patient Active Problem List   Diagnosis Date Noted  . New-onset angina 01/14/2015  . Left homonymous hemianopsia 09/06/2014  . Occipital infarction 09/05/2014  . AKI (acute kidney injury)   . Acute ischemic right MCA stroke 08/30/2014  . Acute right PCA stroke 08/30/2014  . Abnormal chest x-ray 08/22/2014  . Type 2 diabetes mellitus  with diabetic neuropathy 01/04/2014  . HYPERCHOLESTEROLEMIA, MILD 05/22/2009  . PERS HX NONCOMPLIANCE W/MED TX PRS HAZARDS HLTH 07/11/2007  . Essential hypertension 05/04/2007  . Osteoarthritis 05/04/2007  .  Fibromyalgia 05/04/2007    [name], OTR/L 03/19/2015, 9:57 AM  [facility] [address] [place], [place], [address] Phone: [phone]   Fax:  [phone]

## 2015-03-24 ENCOUNTER — Encounter: Payer: Self-pay | Admitting: Internal Medicine

## 2015-03-25 ENCOUNTER — Ambulatory Visit: Payer: Medicare HMO | Admitting: Occupational Therapy

## 2015-03-25 ENCOUNTER — Ambulatory Visit
Payer: Medicare HMO | Attending: Physical Medicine & Rehabilitation | Admitting: Rehabilitative and Restorative Service Providers"

## 2015-03-25 ENCOUNTER — Other Ambulatory Visit: Payer: Self-pay | Admitting: Pulmonary Disease

## 2015-03-25 VITALS — BP 186/116

## 2015-03-25 DIAGNOSIS — R269 Unspecified abnormalities of gait and mobility: Secondary | ICD-10-CM

## 2015-03-25 DIAGNOSIS — H539 Unspecified visual disturbance: Secondary | ICD-10-CM | POA: Insufficient documentation

## 2015-03-25 DIAGNOSIS — H53462 Homonymous bilateral field defects, left side: Secondary | ICD-10-CM | POA: Diagnosis not present

## 2015-03-25 DIAGNOSIS — I69319 Unspecified symptoms and signs involving cognitive functions following cerebral infarction: Secondary | ICD-10-CM | POA: Diagnosis not present

## 2015-03-25 DIAGNOSIS — R2681 Unsteadiness on feet: Secondary | ICD-10-CM | POA: Insufficient documentation

## 2015-03-25 DIAGNOSIS — I6980 Unspecified sequelae of other cerebrovascular disease: Secondary | ICD-10-CM | POA: Diagnosis not present

## 2015-03-25 DIAGNOSIS — M25642 Stiffness of left hand, not elsewhere classified: Secondary | ICD-10-CM | POA: Diagnosis not present

## 2015-03-25 DIAGNOSIS — R6889 Other general symptoms and signs: Secondary | ICD-10-CM | POA: Insufficient documentation

## 2015-03-25 DIAGNOSIS — G8194 Hemiplegia, unspecified affecting left nondominant side: Secondary | ICD-10-CM | POA: Diagnosis present

## 2015-03-25 DIAGNOSIS — R531 Weakness: Secondary | ICD-10-CM | POA: Diagnosis not present

## 2015-03-25 DIAGNOSIS — R279 Unspecified lack of coordination: Secondary | ICD-10-CM | POA: Diagnosis not present

## 2015-03-25 DIAGNOSIS — R69 Illness, unspecified: Secondary | ICD-10-CM | POA: Diagnosis not present

## 2015-03-25 NOTE — Therapy (Signed)
[facility] [address] [place], [place], [address] Phone: [phone]   Fax:  [phone]  Occupational Therapy Treatment  Patient Details  Name: Kayla [name] MRN: [id number] Date of Birth: 1941-01-06 Referring Provider:  Hoyt Koch, *  Encounter Date: 03/25/2015  Patient here for appointment today, but unable to be seen due to high blood pressure in PT session.  Patient indicated that she did not take her blood pressure medication this morning.  Walked patient out to her husband waiting in the lobby, and shared that she had an elevated BP.  He confirmed that she did not take BP medication this morning.  Reinforced the importance of this medication - patient more unsteady today.    Past Medical History  Diagnosis Date  . Asthmatic bronchitis   . Hypertension   . Venous insufficiency   . Hypercholesteremia   . Diverticulosis of colon   . Constipation   . Prolapse of vaginal vault after hysterectomy   . Proteinuria   . Fibromyalgia   . Somatic dysfunction   . Anxiety   . Personal history of noncompliance with medical treatment, presenting hazards to health   . Sickle-cell trait   . Allergy     Shell fish  . GERD (gastroesophageal reflux disease)   . Heart murmur   . Coronary artery disease   . Type II diabetes mellitus   . Blind left eye   . DJD (degenerative joint disease)   . Arthritis     "knees, back, probably left hand" (01/14/2015)  . History of gout   . Depression   . Stroke ~ 08/2014    "blind in left eye; weak on left side since" (01/14/2015)    Past Surgical History  Procedure Laterality Date  . Abdominal hysterectomy    . Robotic assisted laparoscopic sacrocolpopexy  09/2009    Dr. Matilde Sprang  . Cataract extraction w/ intraocular lens  implant, bilateral Bilateral 2012  . Tee without cardioversion N/A 09/02/2014    Procedure: TRANSESOPHAGEAL ECHOCARDIOGRAM (TEE);  Surgeon: Lelon Perla, MD;  Location: [facility] ENDOSCOPY;  Service: Cardiovascular;  Laterality: N/A;  . Loop recorder implant N/A 09/03/2014    Procedure: LOOP RECORDER IMPLANT;  Surgeon: Thompson Grayer, MD;  Location: [facility] CATH LAB;  Service: Cardiovascular;  Laterality: N/A;  . Cardiac catheterization N/A 01/14/2015    Procedure: Left Heart Cath and Coronary Angiography;  Surgeon: Charolette Forward, MD;  Location: [facility] CV LAB;  Service: Cardiovascular;  Laterality: N/A;  . Cardiac catheterization  ~ 2011  . Dilation and curettage of uterus  "probably"  . Coronary angioplasty      There were no vitals filed for this visit.  Visit Diagnosis:  Unsteadiness                              OT Short Term Goals - 03/12/15 1012    OT SHORT TERM GOAL #1   Title Pt will be independent with initial HEP.--check STGs 03/05/15   Time 4   Period Weeks   Status Achieved   OT SHORT TERM GOAL #2   Title Pt will perform tabletop visual scanning with at least 90% using compensation strategies prn without cues.   Baseline 75% with min cues   Time 4   Period Weeks   Status On-going   OT SHORT TERM GOAL #3   Title Pt will improve coordination for ADLs as shown  by improving time on 9-hole peg test by at least 10sec with LUE.   Baseline 75.10sec   Time 4   Period Weeks   Status On-going  03/10/15:  103.47sec   OT SHORT TERM GOAL #4   Title Pt will demo at least 90% gross finger flex for grasping objects with LUE.   Baseline 75%   Time 4   Period Weeks   Status Achieved   OT SHORT TERM GOAL #5   Title Pt will be able to complete fasteners mod I consistently.   Time 4   Period Weeks   Status Achieved  per pt report, does with difficulty           OT Long Term Goals - 02/03/15 1811    OT LONG TERM GOAL #1   Title Pt will be independent with updated HEP.--check LTGs 04/04/15   Time 8   Period Weeks   Status New   OT LONG TERM GOAL #2   Title Pt will verbalize understanding of cognitive  compensation strategies for ADLs prn.   Time 8   Period Weeks   Status New   OT LONG TERM GOAL #3   Title Pt will perform simple environmental scanning with at least 90% accuracy for improved safety.   Time 8   Period Weeks   Status New   OT LONG TERM GOAL #4   Title Pt will improve coordination for ADLs as shown by improving time on 9-hole peg test by at least 20sec with LUE.   Baseline 75.10sec   Time 8   Period Weeks   Status New   OT LONG TERM GOAL #5   Title Pt will demo at least 15lbs L grip strength to assist in opening containers.   Baseline 0lbs   Time 8   Period Weeks   Status New   Long Term Additional Goals   Additional Long Term Goals Yes   OT LONG TERM GOAL #6   Title Pt will perform simple snack prep/meal prep with supervision.   Baseline not performing   Time 8   Period Weeks   Status New               Problem List Patient Active Problem List   Diagnosis Date Noted  . New-onset angina ([place]) 01/14/2015  . Left homonymous hemianopsia 09/06/2014  . Occipital infarction ([place]) 09/05/2014  . AKI (acute kidney injury) ([place])   . Acute ischemic right MCA stroke ([place]) 08/30/2014  . Acute right PCA stroke ([place]) 08/30/2014  . Abnormal chest x-ray 08/22/2014  . Type 2 diabetes mellitus with diabetic neuropathy ([place]) 01/04/2014  . HYPERCHOLESTEROLEMIA, MILD 05/22/2009  . PERS HX NONCOMPLIANCE W/MED TX PRS HAZARDS HLTH 07/11/2007  . Essential hypertension 05/04/2007  . Osteoarthritis 05/04/2007  . Fibromyalgia 05/04/2007    [name] 03/25/2015, 8:58 AM  [name] [address] [place], [place], [address] Phone: [phone]   Fax:  [phone]

## 2015-03-25 NOTE — Therapy (Signed)
[facility] [address] [place], [place], [address] Phone: [phone]   Fax:  [phone]  Physical Therapy Treatment  Patient Details  Name: Kayla [name] MRN: [id number] Date of Birth: Nov 13, 1940 Referring Provider:  Hoyt Koch, *  Encounter Date: 03/25/2015      PT End of Session - 03/25/15 1453    Visit Number 9   Number of Visits 16   Date for PT Re-Evaluation 04/05/15   Authorization Type G code every 10th visit, copay   PT Start Time 0808   PT Stop Time 0845   PT Time Calculation (min) 37 min   Equipment Utilized During Treatment Gait belt   Activity Tolerance Patient tolerated treatment well   Behavior During Therapy Flat affect      Past Medical History  Diagnosis Date  . Asthmatic bronchitis   . Hypertension   . Venous insufficiency   . Hypercholesteremia   . Diverticulosis of colon   . Constipation   . Prolapse of vaginal vault after hysterectomy   . Proteinuria   . Fibromyalgia   . Somatic dysfunction   . Anxiety   . Personal history of noncompliance with medical treatment, presenting hazards to health   . Sickle-cell trait   . Allergy     Shell fish  . GERD (gastroesophageal reflux disease)   . Heart murmur   . Coronary artery disease   . Type II diabetes mellitus   . Blind left eye   . DJD (degenerative joint disease)   . Arthritis     "knees, back, probably left hand" (01/14/2015)  . History of gout   . Depression   . Stroke ~ 08/2014    "blind in left eye; weak on left side since" (01/14/2015)    Past Surgical History  Procedure Laterality Date  . Abdominal hysterectomy    . Robotic assisted laparoscopic sacrocolpopexy  09/2009    Dr. Matilde Sprang  . Cataract extraction w/ intraocular lens  implant, bilateral Bilateral 2012  . Tee without cardioversion N/A 09/02/2014    Procedure: TRANSESOPHAGEAL ECHOCARDIOGRAM (TEE);  Surgeon: Lelon Perla, MD;  Location: [facility]  ENDOSCOPY;  Service: Cardiovascular;  Laterality: N/A;  . Loop recorder implant N/A 09/03/2014    Procedure: LOOP RECORDER IMPLANT;  Surgeon: Thompson Grayer, MD;  Location: [facility] CATH LAB;  Service: Cardiovascular;  Laterality: N/A;  . Cardiac catheterization N/A 01/14/2015    Procedure: Left Heart Cath and Coronary Angiography;  Surgeon: Charolette Forward, MD;  Location: [facility] CV LAB;  Service: Cardiovascular;  Laterality: N/A;  . Cardiac catheterization  ~ 2011  . Dilation and curettage of uterus  "probably"  . Coronary angioplasty      Filed Vitals:   03/25/15 0812 03/25/15 0844  BP: 207/112 186/116    Visit Diagnosis:  Abnormality of gait  Generalized weakness      Subjective Assessment - 03/25/15 2707    Subjective The patient reports still running into things at home on the left side.  She notes greater instability this morning.   Patient Stated Goals improve use of L UE and improve balance.   Currently in Pain? Yes   Pain Score 3    Pain Location Leg   Pain Orientation Right   Pain Descriptors / Indicators Aching;Burning   Pain Type Chronic pain   Pain Onset More than a month ago   Aggravating Factors  worse this morning   Pain Relieving Factors ointment  [facility] Adult PT Treatment/Exercise - 03/25/15 0836    Transfers   Comments sit<>stand emphasizing tall posture upon rising and increased speed of movement x 10 reps   Ambulation/Gait   Ambulation/Gait Yes   Ambulation/Gait Assistance 4: Min guard   Ambulation/Gait Assistance Details cues due to L visual field loss   Ambulation Distance (Feet) 250 Feet  feet x 3 reps   Assistive device None   Gait Pattern Decreased step length - left  bilateral knee flexion   Ambulation Surface Level   Gait Comments PT emphasized longer stride length, knee extension at initial contact phase of gait, and wider base of support   Neuro Re-ed    Neuro Re-ed Details  Standing wide lateral steps with push-off to return to  neutral position with ER of hips emphasized x 10 reps to each side, backwards walking emphasizing hip extension in parallel bars.    Exercises   Other Exercises  thomas test, hamstring stretch            PT Short Term Goals - 03/10/15 1024    PT SHORT TERM GOAL #1   Title The patient will be able to perform HEP with assist from family.   Baseline Patient has HEP, but not performing regularly.   Time 4   Period Weeks   Status Partially Met   PT SHORT TERM GOAL #2   Title The patient will improve Berg score from 33/56 to > or equal to 40/56 to demo decreased risk for fall.   Baseline Met on 03/10/2015 with patient scoring 45/56.   Time 4   Period Weeks   Status Achieved   PT SHORT TERM GOAL #3   Title The patient will improve ambulation speed from 1.98 ft/sec to > or equal to 2.4 ft/sec to demo improving mobility.   Baseline Pt improved to 2.23 ft/sec for gait speed on 03/10/2015   Time 4   Period Weeks   Status Partially Met   PT SHORT TERM GOAL #4   Title The patient will negotiate 4 steps with close supervision with bilateral handrails and step to pattern.   Baseline Pt performs reciprocal pattern with one handrail and 4 steps with supervision   Time 4   Period Weeks   Status Achieved           PT Long Term Goals - 03/19/15 1015    PT LONG TERM GOAL #1   Title The patient will improve functional status score from 50% up to > or equal to 62% for improved perception of disability.   Baseline Target date 04/05/2015   Time 8   Period Weeks   Status On-going   PT LONG TERM GOAL #2   Title The patient will improve Berg score from 33/56 to > or equal to 43/56 to demo decreasing risk for falls.   Baseline Target date 04/05/2015   Time 8   Period Weeks   Status On-going   PT LONG TERM GOAL #3   Title The patient will improve endurance by increasing ambulation distance from 200 ft nonstop to > than or equal to 400 ft nonstop.   Baseline Patient ambulates >500 feet nonstop  in PT 03/19/2015.   Time 8   Period Weeks   Status Achieved   PT LONG TERM GOAL #4   Title The patient will improve gait speed from 1.98 ft/sec to > or equal to 2.62 ft/sec to demo transition to full community ambulator classification of gait.  Baseline Met on 03/19/2015 scoring 3.03 ft/sec.   Time 8   Period Weeks   Status Achieved               Plan - 03/25/15 1454    Clinical Impression Statement The patient was not feeling as steady today and later reported in our session that she did not take meds before attending PT this morning.  The patient had hypertension and PT ended session with patient instructed to take meds/eat breakfast.  Recommended patient change appointment times in therapy to allow her time to take meds before arriving to PT.   PT Next Visit Plan hip strength, femoral ER in standing (wants to IR), dynamic balance activities, LE strengthening, gait training with L visual scanning *Cert due next week*   Consulted and Agree with Plan of Care Patient        Problem List Patient Active Problem List   Diagnosis Date Noted  . New-onset angina ([place]) 01/14/2015  . Left homonymous hemianopsia 09/06/2014  . Occipital infarction ([place]) 09/05/2014  . AKI (acute kidney injury) ([place])   . Acute ischemic right MCA stroke ([place]) 08/30/2014  . Acute right PCA stroke ([place]) 08/30/2014  . Abnormal chest x-ray 08/22/2014  . Type 2 diabetes mellitus with diabetic neuropathy ([place]) 01/04/2014  . HYPERCHOLESTEROLEMIA, MILD 05/22/2009  . PERS HX NONCOMPLIANCE W/MED TX PRS HAZARDS HLTH 07/11/2007  . Essential hypertension 05/04/2007  . Osteoarthritis 05/04/2007  . Fibromyalgia 05/04/2007    Suren Payne, PT 03/25/2015, 2:58 PM  [facility] [address] [place], [place], [address] Phone: [phone]   Fax:  [phone]

## 2015-03-27 ENCOUNTER — Encounter: Payer: Self-pay | Admitting: Occupational Therapy

## 2015-03-27 ENCOUNTER — Encounter: Payer: Self-pay | Admitting: Internal Medicine

## 2015-03-27 ENCOUNTER — Ambulatory Visit: Payer: Medicare HMO | Admitting: Rehabilitative and Restorative Service Providers"

## 2015-03-27 ENCOUNTER — Ambulatory Visit: Payer: Medicare HMO | Admitting: Occupational Therapy

## 2015-03-27 VITALS — BP 164/104 | HR 83

## 2015-03-27 DIAGNOSIS — R269 Unspecified abnormalities of gait and mobility: Secondary | ICD-10-CM

## 2015-03-27 DIAGNOSIS — H539 Unspecified visual disturbance: Secondary | ICD-10-CM

## 2015-03-27 DIAGNOSIS — H53462 Homonymous bilateral field defects, left side: Secondary | ICD-10-CM

## 2015-03-27 DIAGNOSIS — I69319 Unspecified symptoms and signs involving cognitive functions following cerebral infarction: Secondary | ICD-10-CM

## 2015-03-27 DIAGNOSIS — M25642 Stiffness of left hand, not elsewhere classified: Secondary | ICD-10-CM

## 2015-03-27 DIAGNOSIS — R531 Weakness: Secondary | ICD-10-CM

## 2015-03-27 DIAGNOSIS — IMO0002 Reserved for concepts with insufficient information to code with codable children: Secondary | ICD-10-CM

## 2015-03-27 DIAGNOSIS — G8194 Hemiplegia, unspecified affecting left nondominant side: Secondary | ICD-10-CM

## 2015-03-27 NOTE — Therapy (Signed)
[facility] [address] [place], [place], [address] Phone: [phone]   Fax:  [phone]  Physical Therapy Treatment  Patient Details  Name: Kayla [name] MRN: [id number] Date of Birth: [birth year] Referring Provider:  Hoyt Koch, *  Encounter Date: 03/27/2015      PT End of Session - 03/27/15 1222    Visit Number 10   Number of Visits 16   Date for PT Re-Evaluation 04/05/15   Authorization Type G code every 10th visit, copay   PT Start Time 0853   PT Stop Time 0925   PT Time Calculation (min) 32 min   Equipment Utilized During Treatment Gait belt   Activity Tolerance Patient tolerated treatment well   Behavior During Therapy Flat affect      Past Medical History  Diagnosis Date  . Asthmatic bronchitis   . Hypertension   . Venous insufficiency   . Hypercholesteremia   . Diverticulosis of colon   . Constipation   . Prolapse of vaginal vault after hysterectomy   . Proteinuria   . Fibromyalgia   . Somatic dysfunction   . Anxiety   . Personal history of noncompliance with medical treatment, presenting hazards to health   . Sickle-cell trait ([place])   . Allergy     Shell fish  . GERD (gastroesophageal reflux disease)   . Heart murmur   . Coronary artery disease   . Type II diabetes mellitus ([place])   . Blind left eye   . DJD (degenerative joint disease)   . Arthritis     "knees, back, probably left hand" (01/14/2015)  . History of gout   . Depression   . Stroke [facility]) ~ 08/2014    "blind in left eye; weak on left side since" (01/14/2015)    Past Surgical History  Procedure Laterality Date  . Abdominal hysterectomy    . Robotic assisted laparoscopic sacrocolpopexy  09/2009    Dr. Matilde Sprang  . Cataract extraction w/ intraocular lens  implant, bilateral Bilateral 2012  . Tee without cardioversion N/A 09/02/2014    Procedure: TRANSESOPHAGEAL ECHOCARDIOGRAM (TEE);  Surgeon: Lelon Perla, MD;   Location: [facility] ENDOSCOPY;  Service: Cardiovascular;  Laterality: N/A;  . Loop recorder implant N/A 09/03/2014    Procedure: LOOP RECORDER IMPLANT;  Surgeon: Thompson Grayer, MD;  Location: [facility] CATH LAB;  Service: Cardiovascular;  Laterality: N/A;  . Cardiac catheterization N/A 01/14/2015    Procedure: Left Heart Cath and Coronary Angiography;  Surgeon: Charolette Forward, MD;  Location: [facility] CV LAB;  Service: Cardiovascular;  Laterality: N/A;  . Cardiac catheterization  ~ 2011  . Dilation and curettage of uterus  "probably"  . Coronary angioplasty      Filed Vitals:   03/27/15 0855 03/27/15 0910 03/27/15 0919  BP: 166/101 183/93 164/104  Pulse: 82 83     Visit Diagnosis:  Abnormality of gait  Generalized weakness      Subjective Assessment - 03/27/15 0855    Subjective The patient took BP meds this morning.  Checked vitals due to HTN last session and resting BP=166/101.  HR=82.    Patient Stated Goals improve use of L UE and improve balance.   Currently in Pain? No/denies           [facility] Adult PT Treatment/Exercise - 03/27/15 0903    Ambulation/Gait   Ambulation/Gait Yes   Ambulation/Gait Assistance 5: Supervision   Ambulation/Gait Assistance Details emphasized need for assistive device when in the community  with patient and her daughter due to L visual field loss and occasional loss of balance   Ambulation Distance (Feet) 100 Feet  x 3 reps   Assistive device None   Gait Pattern Decreased step length - left;Decreased step length - right  decreased knee extension at initial contact of gait   Ambulation Surface Level   Stairs Yes   Stairs Assistance 5: Supervision   Stairs Assistance Details (indicate cue type and reason) two rail   Stair Management Technique Alternating pattern   Number of Stairs 4   Gait Comments worked on upright posture, arm swing and longer stride length   Standardized Balance Assessment   Standardized Balance Assessment Berg Balance Test   Berg  Balance Test   Sit to Stand Able to stand without using hands and stabilize independently   Standing Unsupported Able to stand safely 2 minutes   Sitting with Back Unsupported but Feet Supported on Floor or Stool Able to sit safely and securely 2 minutes   Stand to Sit Sits safely with minimal use of hands   Transfers Able to transfer safely, minor use of hands   Standing Unsupported with Eyes Closed Able to stand 10 seconds with supervision   Standing Ubsupported with Feet Together Able to place feet together independently and stand 1 minute safely   From Standing, Reach Forward with Outstretched Arm Can reach forward >12 cm safely (5")   From Standing Position, Pick up Object from Floor Able to pick up shoe safely and easily   From Standing Position, Turn to Look Behind Over each Shoulder Turn sideways only but maintains balance   Turn 360 Degrees Able to turn 360 degrees safely but slowly   Standing Unsupported, Alternately Place Feet on Step/Stool Able to complete 4 steps without aid or supervision   Standing Unsupported, One Foot in Front Able to plae foot ahead of the other independently and hold 30 seconds   Standing on One Leg Able to lift leg independently and hold equal to or more than 3 seconds   Total Score 45   Neuro Re-ed    Neuro Re-ed Details  Standing balance with postural cues    Exercises   Exercises Other Exercises   Other Exercises  hamstring stretch, seated marching with arms in ER for scapular retraction, and seated trunk rotation.            PT Short Term Goals - 03/10/15 1024    PT SHORT TERM GOAL #1   Title The patient will be able to perform HEP with assist from family.   Baseline Patient has HEP, but not performing regularly.   Time 4   Period Weeks   Status Partially Met   PT SHORT TERM GOAL #2   Title The patient will improve Berg score from 33/56 to > or equal to 40/56 to demo decreased risk for fall.   Baseline Met on 03/10/2015 with patient scoring  45/56.   Time 4   Period Weeks   Status Achieved   PT SHORT TERM GOAL #3   Title The patient will improve ambulation speed from 1.98 ft/sec to > or equal to 2.4 ft/sec to demo improving mobility.   Baseline Pt improved to 2.23 ft/sec for gait speed on 03/10/2015   Time 4   Period Weeks   Status Partially Met   PT SHORT TERM GOAL #4   Title The patient will negotiate 4 steps with close supervision with bilateral handrails and step to pattern.  Baseline Pt performs reciprocal pattern with one handrail and 4 steps with supervision   Time 4   Period Weeks   Status Achieved           PT Long Term Goals - 03/27/15 4163    PT LONG TERM GOAL #1   Title The patient will improve functional status score from 50% up to > or equal to 62% for improved perception of disability.   Baseline Target date 04/05/2015   Time 8   Period Weeks   Status On-going   PT LONG TERM GOAL #2   Title The patient will improve Berg score from 33/56 to > or equal to 43/56 to demo decreasing risk for falls.   Baseline Patient scores 45/56 on Berg balance scale.     Time 8   Period Weeks   Status Achieved   PT LONG TERM GOAL #3   Title The patient will improve endurance by increasing ambulation distance from 200 ft nonstop to > than or equal to 400 ft nonstop.   Baseline Patient ambulates >500 feet nonstop in PT 03/19/2015.   Time 8   Period Weeks   Status Achieved   PT LONG TERM GOAL #4   Title The patient will improve gait speed from 1.98 ft/sec to > or equal to 2.62 ft/sec to demo transition to full community ambulator classification of gait.   Baseline Met on 03/19/2015 scoring 3.03 ft/sec.   Time 8   Period Weeks   Status Achieved               Plan - 03/27/15 0919    Clinical Impression Statement The patient's daughter reports the patient ran out of one of her BP meds (unsure which one) and she will get it filled today.  PT discussed importance of taking meds to reduce risk of stroke and  manage hypertension.  Patient has met most LTGs, therefore PT to f/u with checking home program and discharging next week.  PT recommends use of single point cane for safety.  We were limited in activities today due to hypertension and need for monitoring t/o session.     PT Next Visit Plan Check remaining LTGs, discharge with HEP, discuss safety due to fall risk and decresed awareness   Consulted and Agree with Plan of Care Patient        Problem List Patient Active Problem List   Diagnosis Date Noted  . New-onset angina ([place]) 01/14/2015  . Left homonymous hemianopsia 09/06/2014  . Occipital infarction ([place]) 09/05/2014  . AKI (acute kidney injury) ([place])   . Acute ischemic right MCA stroke ([place]) 08/30/2014  . Acute right PCA stroke ([place]) 08/30/2014  . Abnormal chest x-ray 08/22/2014  . Type 2 diabetes mellitus with diabetic neuropathy ([place]) 01/04/2014  . HYPERCHOLESTEROLEMIA, MILD 05/22/2009  . PERS HX NONCOMPLIANCE W/MED TX PRS HAZARDS HLTH 07/11/2007  . Essential hypertension 05/04/2007  . Osteoarthritis 05/04/2007  . Fibromyalgia 05/04/2007    Ronald Vinsant, PT 03/27/2015, 12:25 PM  [facility] [address] [place], [place], [address] Phone: [phone]   Fax:  [phone]

## 2015-03-27 NOTE — Therapy (Signed)
[facility] [address] [place], [place], [address] Phone: [phone]   Fax:  [phone]  Occupational Therapy Treatment  Patient Details  Name: Kayla [name] MRN: [id number] Date of Birth: July 13, 1940 Referring Provider:  Hoyt Koch, *  Encounter Date: 03/27/2015      OT End of Session - 03/27/15 0946    Visit Number 8   Number of Visits 17   Date for OT Re-Evaluation 04/04/15   Authorization Type [employer], no visit limit/auth, G-code needed   Authorization - Visit Number 8   Authorization - Number of Visits 10   OT Start Time 0935   OT Stop Time 1015   OT Time Calculation (min) 40 min   Activity Tolerance Patient tolerated treatment well   Behavior During Therapy [facility] for tasks assessed/performed      Past Medical History  Diagnosis Date  . Asthmatic bronchitis   . Hypertension   . Venous insufficiency   . Hypercholesteremia   . Diverticulosis of colon   . Constipation   . Prolapse of vaginal vault after hysterectomy   . Proteinuria   . Fibromyalgia   . Somatic dysfunction   . Anxiety   . Personal history of noncompliance with medical treatment, presenting hazards to health   . Sickle-cell trait   . Allergy     Shell fish  . GERD (gastroesophageal reflux disease)   . Heart murmur   . Coronary artery disease   . Type II diabetes mellitus   . Blind left eye   . DJD (degenerative joint disease)   . Arthritis     "knees, back, probably left hand" (01/14/2015)  . History of gout   . Depression   . Stroke ~ 08/2014    "blind in left eye; weak on left side since" (01/14/2015)    Past Surgical History  Procedure Laterality Date  . Abdominal hysterectomy    . Robotic assisted laparoscopic sacrocolpopexy  09/2009    Dr. Matilde Sprang  . Cataract extraction w/ intraocular lens  implant, bilateral Bilateral 2012  . Tee without cardioversion N/A 09/02/2014    Procedure: TRANSESOPHAGEAL  ECHOCARDIOGRAM (TEE);  Surgeon: Lelon Perla, MD;  [facility] ENDOSCOPY;  Service: Cardiovascular;  Laterality: N/A;  . Loop recorder implant N/A 09/03/2014    Procedure: LOOP RECORDER IMPLANT;  Surgeon: Thompson Grayer, MD;  Location: [facility] CATH LAB;  Service: Cardiovascular;  Laterality: N/A;  . Cardiac catheterization N/A 01/14/2015    Procedure: Left Heart Cath and Coronary Angiography;  Surgeon: Charolette Forward, MD;  Location: [facility] CV LAB;  Service: Cardiovascular;  Laterality: N/A;  . Cardiac catheterization  ~ 2011  . Dilation and curettage of uterus  "probably"  . Coronary angioplasty      There were no vitals filed for this visit.  Visit Diagnosis:  Left homonymous hemianopsia  Visual disturbance  Lack of coordination due to stroke  Left hemiparesis (HCC)  Cognitive deficits following cerebral infarction  Stiffness of left hand joint      Subjective Assessment - 03/27/15 0944    Subjective  Pt forgot reading glasses   Patient is accompained by: Family member   Pertinent History CVA 08/19/14, hospitalization in 7/16 for cardiac procedure, see epic snapshot   Limitations hard of hearing, L homonymous hemianopsia   Patient Stated Goals Use LUE more   Currently in Pain? No/denies  OT Treatments/Exercises (OP) - 03/27/15 0001    Hand Exercises   Other Hand Exercises Picking up blocks using 15lbs sustained grip strength with mod difficulty and min cues.  Picked blocks up by color for visual scanning/awareness to the L side.  Only 1 v.c. needed for scanning to the L during activity. (completed approx 1/2 blocks)   Visual/Perceptual Exercises   Copy this Image Pegboard   Pegboard copying simple small peg design using LUE with min difficulty with coordination and moderate cueing for attention, organization and to copy correctly.  Pt started on right side and need cueing x2 for missed items on the L side.  Significant incr time needed.                   OT Short Term Goals - 03/12/15 1012    OT SHORT TERM GOAL #1   Title Pt will be independent with initial HEP.--check STGs 03/05/15   Time 4   Period Weeks   Status Achieved   OT SHORT TERM GOAL #2   Title Pt will perform tabletop visual scanning with at least 90% using compensation strategies prn without cues.   Baseline 75% with min cues   Time 4   Period Weeks   Status On-going   OT SHORT TERM GOAL #3   Title Pt will improve coordination for ADLs as shown by improving time on 9-hole peg test by at least 10sec with LUE.   Baseline 75.10sec   Time 4   Period Weeks   Status On-going  03/10/15:  103.47sec   OT SHORT TERM GOAL #4   Title Pt will demo at least 90% gross finger flex for grasping objects with LUE.   Baseline 75%   Time 4   Period Weeks   Status Achieved   OT SHORT TERM GOAL #5   Title Pt will be able to complete fasteners mod I consistently.   Time 4   Period Weeks   Status Achieved  per pt report, does with difficulty           OT Long Term Goals - 02/03/15 1811    OT LONG TERM GOAL #1   Title Pt will be independent with updated HEP.--check LTGs 04/04/15   Time 8   Period Weeks   Status New   OT LONG TERM GOAL #2   Title Pt will verbalize understanding of cognitive compensation strategies for ADLs prn.   Time 8   Period Weeks   Status New   OT LONG TERM GOAL #3   Title Pt will perform simple environmental scanning with at least 90% accuracy for improved safety.   Time 8   Period Weeks   Status New   OT LONG TERM GOAL #4   Title Pt will improve coordination for ADLs as shown by improving time on 9-hole peg test by at least 20sec with LUE.   Baseline 75.10sec   Time 8   Period Weeks   Status New   OT LONG TERM GOAL #5   Title Pt will demo at least 15lbs L grip strength to assist in opening containers.   Baseline 0lbs   Time 8   Period Weeks   Status New   Long Term Additional Goals   Additional Long Term Goals Yes    OT LONG TERM GOAL #6   Title Pt will perform simple snack prep/meal prep with supervision.   Baseline not performing   Time 8   Period Weeks  Status New               Plan - 03/27/15 0948    Clinical Impression Statement Pt progressing slowly towards goals but cognitive deficits continue to affect carryover.  Seated activities due to high BP (see PT note, pt's dtr reported that she needs to refill one of her BP meds today).    Plan check goals next week, ?renew     Consulted and Agree with Plan of Care Patient        Problem List Patient Active Problem List   Diagnosis Date Noted  . New-onset angina ([place]) 01/14/2015  . Left homonymous hemianopsia 09/06/2014  . Occipital infarction ([place]) 09/05/2014  . AKI (acute kidney injury) ([place])   . Acute ischemic right MCA stroke ([place]) 08/30/2014  . Acute right PCA stroke ([place]) 08/30/2014  . Abnormal chest x-ray 08/22/2014  . Type 2 diabetes mellitus with diabetic neuropathy ([place]) 01/04/2014  . HYPERCHOLESTEROLEMIA, MILD 05/22/2009  . PERS HX NONCOMPLIANCE W/MED TX PRS HAZARDS HLTH 07/11/2007  . Essential hypertension 05/04/2007  . Osteoarthritis 05/04/2007  . Fibromyalgia 05/04/2007    [facility] 03/27/2015, 10:17 AM  [facility] [address] [place], [place], [address] Phone: [phone]   Fax:  [place], OTR/L 03/27/2015 10:17 AM

## 2015-04-01 ENCOUNTER — Ambulatory Visit: Payer: Medicare HMO | Admitting: Rehabilitative and Restorative Service Providers"

## 2015-04-01 ENCOUNTER — Encounter: Payer: Self-pay | Admitting: Rehabilitative and Restorative Service Providers"

## 2015-04-01 ENCOUNTER — Ambulatory Visit: Payer: Medicare HMO | Admitting: Occupational Therapy

## 2015-04-01 ENCOUNTER — Ambulatory Visit (INDEPENDENT_AMBULATORY_CARE_PROVIDER_SITE_OTHER): Payer: Medicare HMO | Admitting: *Deleted

## 2015-04-01 VITALS — BP 163/96 | HR 71

## 2015-04-01 VITALS — BP 146/108 | HR 79

## 2015-04-01 DIAGNOSIS — I10 Essential (primary) hypertension: Secondary | ICD-10-CM

## 2015-04-01 DIAGNOSIS — M25642 Stiffness of left hand, not elsewhere classified: Secondary | ICD-10-CM

## 2015-04-01 DIAGNOSIS — H539 Unspecified visual disturbance: Secondary | ICD-10-CM

## 2015-04-01 DIAGNOSIS — I638 Other cerebral infarction: Secondary | ICD-10-CM | POA: Diagnosis not present

## 2015-04-01 DIAGNOSIS — R269 Unspecified abnormalities of gait and mobility: Secondary | ICD-10-CM | POA: Diagnosis not present

## 2015-04-01 DIAGNOSIS — H53462 Homonymous bilateral field defects, left side: Secondary | ICD-10-CM

## 2015-04-01 DIAGNOSIS — G8194 Hemiplegia, unspecified affecting left nondominant side: Secondary | ICD-10-CM

## 2015-04-01 DIAGNOSIS — R2681 Unsteadiness on feet: Secondary | ICD-10-CM

## 2015-04-01 DIAGNOSIS — I69319 Unspecified symptoms and signs involving cognitive functions following cerebral infarction: Secondary | ICD-10-CM

## 2015-04-01 DIAGNOSIS — IMO0002 Reserved for concepts with insufficient information to code with codable children: Secondary | ICD-10-CM

## 2015-04-01 DIAGNOSIS — R531 Weakness: Secondary | ICD-10-CM

## 2015-04-01 DIAGNOSIS — I6389 Other cerebral infarction: Secondary | ICD-10-CM

## 2015-04-01 NOTE — Therapy (Signed)
[facility] [address] [place], [place], [address] Phone: [phone]   Fax:  [phone]  Patient Details  Name: Kayla [name] MRN: [id number] Date of Birth: 1940-12-25 Referring Provider:  No ref. provider found  Encounter Date: 04/01/2015  Physical Therapy Progress Note  Dates of Reporting Period:  02/03/2015  to 04/01/2015  Objective Reports of Subjective Statement: Patient with improving mobility around home and community.    Objective Measurements:      PT Short Term Goals - 03/10/15 1024    PT SHORT TERM GOAL #1   Title The patient will be able to perform HEP with assist from family.   Baseline Patient has HEP, but not performing regularly.   Time 4   Period Weeks   Status Partially Met   PT SHORT TERM GOAL #2   Title The patient will improve Berg score from 33/56 to > or equal to 40/56 to demo decreased risk for fall.   Baseline Met on 03/10/2015 with patient scoring 45/56.   Time 4   Period Weeks   Status Achieved   PT SHORT TERM GOAL #3   Title The patient will improve ambulation speed from 1.98 ft/sec to > or equal to 2.4 ft/sec to demo improving mobility.   Baseline Pt improved to 2.23 ft/sec for gait speed on 03/10/2015   Time 4   Period Weeks   Status Partially Met   PT SHORT TERM GOAL #4   Title The patient will negotiate 4 steps with close supervision with bilateral handrails and step to pattern.   Baseline Pt performs reciprocal pattern with one handrail and 4 steps with supervision   Time 4   Period Weeks   Status Achieved         PT Long Term Goals - 03/27/15 7673    PT LONG TERM GOAL #1   Title The patient will improve functional status score from 50% up to > or equal to 62% for improved perception of disability.   Baseline Target date 04/05/2015   Time 8   Period Weeks   Status On-going   PT LONG TERM GOAL #2   Title The patient will improve Berg score from 33/56 to > or equal  to 43/56 to demo decreasing risk for falls.   Baseline Patient scores 45/56 on Berg balance scale.     Time 8   Period Weeks   Status Achieved   PT LONG TERM GOAL #3   Title The patient will improve endurance by increasing ambulation distance from 200 ft nonstop to > than or equal to 400 ft nonstop.   Baseline Patient ambulates >500 feet nonstop in PT 03/19/2015.   Time 8   Period Weeks   Status Achieved   PT LONG TERM GOAL #4   Title The patient will improve gait speed from 1.98 ft/sec to > or equal to 2.62 ft/sec to demo transition to full community ambulator classification of gait.   Baseline Met on 03/19/2015 scoring 3.03 ft/sec.   Time 8   Period Weeks   Status Achieved       Goal Update: see above- PT to check remaining goals this week and plan for discharge.  Plan: Continue to remaining goals.  Reason Skilled Services are Required: continued fall risk and abnormality of gait.  PT focusing on carryover to home. Thank you for the referral of this patient. Rudell Cobb, MPT     [place] 04/01/2015, 10:13 AM  [facility]  [facility] [address] [place], [place], [address] Phone: [phone]   Fax:  [phone]

## 2015-04-01 NOTE — Therapy (Signed)
[facility] [address] [place], [place], [address] Phone: [phone]   Fax:  [phone]  Physical Therapy Treatment  Patient Details  Name: Kayla [name] MRN: [id number] Date of Birth: 16-Nov-1940 Referring Provider:  Myrlene Broker, *  Encounter Date: 04/01/2015      PT End of Session - 04/01/15 1014    Visit Number 11   Number of Visits 16   Date for PT Re-Evaluation 04/05/15   Authorization Type G code every 10th visit, copay   PT Start Time 0937   PT Stop Time 1017   PT Time Calculation (min) 40 min   Equipment Utilized During Treatment Gait belt   Activity Tolerance Patient tolerated treatment well   Behavior During Therapy Flat affect      Past Medical History  Diagnosis Date  . Asthmatic bronchitis   . Hypertension   . Venous insufficiency   . Hypercholesteremia   . Diverticulosis of colon   . Constipation   . Prolapse of vaginal vault after hysterectomy   . Proteinuria   . Fibromyalgia   . Somatic dysfunction   . Anxiety   . Personal history of noncompliance with medical treatment, presenting hazards to health   . Sickle-cell trait (HCC)   . Allergy     Shell fish  . GERD (gastroesophageal reflux disease)   . Heart murmur   . Coronary artery disease   . Type II diabetes mellitus (HCC)   . Blind left eye   . DJD (degenerative joint disease)   . Arthritis     "knees, back, probably left hand" (01/14/2015)  . History of gout   . Depression   . Stroke [facility]) ~ 08/2014    "blind in left eye; weak on left side since" (01/14/2015)    Past Surgical History  Procedure Laterality Date  . Abdominal hysterectomy    . Robotic assisted laparoscopic sacrocolpopexy  09/2009    Dr. Sherron Monday  . Cataract extraction w/ intraocular lens  implant, bilateral Bilateral 2012  . Tee without cardioversion N/A 09/02/2014    Procedure: TRANSESOPHAGEAL ECHOCARDIOGRAM (TEE);  Surgeon: Lewayne Bunting,  MD;  Location: [facility] ENDOSCOPY;  Service: Cardiovascular;  Laterality: N/A;  . Loop recorder implant N/A 09/03/2014    Procedure: LOOP RECORDER IMPLANT;  Surgeon: Hillis Range, MD;  Location: [facility] CATH LAB;  Service: Cardiovascular;  Laterality: N/A;  . Cardiac catheterization N/A 01/14/2015    Procedure: Left Heart Cath and Coronary Angiography;  Surgeon: Rinaldo Cloud, MD;  Location: [facility] INVASIVE CV LAB;  Service: Cardiovascular;  Laterality: N/A;  . Cardiac catheterization  ~ 2011  . Dilation and curettage of uterus  "probably"  . Coronary angioplasty      Filed Vitals:   04/01/15 0943 04/01/15 1014 04/01/15 1015  BP: 136/97 156/111 146/108  Pulse: 79      Visit Diagnosis:  Abnormality of gait  Generalized weakness  Unsteadiness      Subjective Assessment - 04/01/15 0943    Subjective The patient took BP meds this morning.  She thinks that her BP is higher due to dietary salt intake.   Patient Stated Goals improve use of L UE and improve balance.   Currently in Pain? Yes   Pain Score 3    Pain Location Leg   Pain Orientation Right   Pain Descriptors / Indicators Aching   Pain Type Chronic pain   Pain Onset More than a month ago   Pain Frequency Intermittent  Aggravating Factors  worse with walking   Pain Relieving Factors varies          [facility] Adult PT Treatment/Exercise - 04/01/15 0947    Ambulation/Gait   Ambulation/Gait Yes   Ambulation/Gait Assistance 5: Supervision   Ambulation Distance (Feet) 345 Feet  feet x 3 reps with visual scanning on the left side   Assistive device None   Gait Pattern Decreased step length - left;Decreased step length - right  decreased knee extension at initial contact of gait   Ambulation Surface Level   Stairs Yes   Stairs Assistance 5: Supervision   Stairs Assistance Details (indicate cue type and reason) two rail   Stair Management Technique Alternating pattern   Number of Stairs 4   x 3 reps    Gait Comments cues for longer  stride, knee extension at initial stance phase, and upright posture   Neuro Re-ed    Neuro Re-ed Details  Standing postural cues with wide leg marching, lateral stepping with return to midline, standing trunk rotation, marching with ball overhead, anterior/posterior stepping and marching with ball overhead with min A, sidestepping with ball overhead x 10 feet x 3 reps with min A   Exercises   Exercises Knee/Hip   Knee/Hip Exercises: Stretches   Passive Hamstring Stretch Right;2 reps;60 seconds  with tightness back of knee; adding heel cord stretch   Knee/Hip Exercises: Seated   Sit to Sand 10 reps;without UE support  with postural cues                  PT Short Term Goals - 03/10/15 1024    PT SHORT TERM GOAL #1   Title The patient will be able to perform HEP with assist from family.   Baseline Patient has HEP, but not performing regularly.   Time 4   Period Weeks   Status Partially Met   PT SHORT TERM GOAL #2   Title The patient will improve Berg score from 33/56 to > or equal to 40/56 to demo decreased risk for fall.   Baseline Met on 03/10/2015 with patient scoring 45/56.   Time 4   Period Weeks   Status Achieved   PT SHORT TERM GOAL #3   Title The patient will improve ambulation speed from 1.98 ft/sec to > or equal to 2.4 ft/sec to demo improving mobility.   Baseline Pt improved to 2.23 ft/sec for gait speed on 03/10/2015   Time 4   Period Weeks   Status Partially Met   PT SHORT TERM GOAL #4   Title The patient will negotiate 4 steps with close supervision with bilateral handrails and step to pattern.   Baseline Pt performs reciprocal pattern with one handrail and 4 steps with supervision   Time 4   Period Weeks   Status Achieved           PT Long Term Goals - 03/27/15 6122    PT LONG TERM GOAL #1   Title The patient will improve functional status score from 50% up to > or equal to 62% for improved perception of disability.   Baseline Target date 04/05/2015    Time 8   Period Weeks   Status On-going   PT LONG TERM GOAL #2   Title The patient will improve Berg score from 33/56 to > or equal to 43/56 to demo decreasing risk for falls.   Baseline Patient scores 45/56 on Berg balance scale.     Time 8  Period Weeks   Status Achieved   PT LONG TERM GOAL #3   Title The patient will improve endurance by increasing ambulation distance from 200 ft nonstop to > than or equal to 400 ft nonstop.   Baseline Patient ambulates >500 feet nonstop in PT 03/19/2015.   Time 8   Period Weeks   Status Achieved   PT LONG TERM GOAL #4   Title The patient will improve gait speed from 1.98 ft/sec to > or equal to 2.62 ft/sec to demo transition to full community ambulator classification of gait.   Baseline Met on 03/19/2015 scoring 3.03 ft/sec.   Time 8   Period Weeks   Status Achieved               Plan - 04/01/15 1425    Clinical Impression Statement The patient's BP was elevated at end of therapy today--the patient reports noncompliance with dietary salt intake recommendations.  She discussed noncompliance with HEP and feels that her memory is not good enough to remember HEP.  PT discussed providing cues to patient with her husband and he did not seem receptive.   PT limited due to lack of carryover.     PT Next Visit Plan Check remaining LTGs, discharge with HEP, discuss safety due to fall risk and decresed awareness   Consulted and Agree with Plan of Care Patient;Family member/caregiver   Family Member Consulted spouse        Problem List Patient Active Problem List   Diagnosis Date Noted  . New-onset angina ([place]) 01/14/2015  . Left homonymous hemianopsia 09/06/2014  . Occipital infarction ([place]) 09/05/2014  . AKI (acute kidney injury) ([place])   . Acute ischemic right MCA stroke ([place]) 08/30/2014  . Acute right PCA stroke ([place]) 08/30/2014  . Abnormal chest x-ray 08/22/2014  . Type 2 diabetes mellitus with diabetic neuropathy ([place]) 01/04/2014  .  HYPERCHOLESTEROLEMIA, MILD 05/22/2009  . PERS HX NONCOMPLIANCE W/MED TX PRS HAZARDS HLTH 07/11/2007  . Essential hypertension 05/04/2007  . Osteoarthritis 05/04/2007  . Fibromyalgia 05/04/2007    Kaleab Frasier, PT 04/01/2015, 2:28 PM  [facility] [address] [place], [place], [address] Phone: [phone]   Fax:  [phone]

## 2015-04-01 NOTE — Therapy (Signed)
[facility] [address] [place], [place], [address] Phone: [phone]   Fax:  [phone]  Occupational Therapy Treatment  Patient Details  Name: Kayla [name] MRN: [id number] Date of Birth: 08-03-1940 Referring Provider:  Hoyt Koch, *  Encounter Date: 04/01/2015      OT End of Session - 04/01/15 0858    Visit Number 9   Number of Visits 17   Date for OT Re-Evaluation 04/04/15   Authorization Type [employer], no visit limit/auth, G-code needed   Authorization - Visit Number 9   Authorization - Number of Visits 10   OT Start Time 0850   OT Stop Time 0930   OT Time Calculation (min) 40 min   Activity Tolerance Patient tolerated treatment well   Behavior During Therapy [facility] for tasks assessed/performed      Past Medical History  Diagnosis Date  . Asthmatic bronchitis   . Hypertension   . Venous insufficiency   . Hypercholesteremia   . Diverticulosis of colon   . Constipation   . Prolapse of vaginal vault after hysterectomy   . Proteinuria   . Fibromyalgia   . Somatic dysfunction   . Anxiety   . Personal history of noncompliance with medical treatment, presenting hazards to health   . Sickle-cell trait ([place])   . Allergy     Shell fish  . GERD (gastroesophageal reflux disease)   . Heart murmur   . Coronary artery disease   . Type II diabetes mellitus ([place])   . Blind left eye   . DJD (degenerative joint disease)   . Arthritis     "knees, back, probably left hand" (01/14/2015)  . History of gout   . Depression   . Stroke [facility]) ~ 08/2014    "blind in left eye; weak on left side since" (01/14/2015)    Past Surgical History  Procedure Laterality Date  . Abdominal hysterectomy    . Robotic assisted laparoscopic sacrocolpopexy  09/2009    Dr. Matilde Sprang  . Cataract extraction w/ intraocular lens  implant, bilateral Bilateral 2012  . Tee without cardioversion N/A 09/02/2014    Procedure:  TRANSESOPHAGEAL ECHOCARDIOGRAM (TEE);  Surgeon: Lelon Perla, MD;  [facility] ENDOSCOPY;  Service: Cardiovascular;  Laterality: N/A;  . Loop recorder implant N/A 09/03/2014    Procedure: LOOP RECORDER IMPLANT;  Surgeon: Thompson Grayer, MD;  Location: [facility] CATH LAB;  Service: Cardiovascular;  Laterality: N/A;  . Cardiac catheterization N/A 01/14/2015    Procedure: Left Heart Cath and Coronary Angiography;  Surgeon: Charolette Forward, MD;  Location: [facility] CV LAB;  Service: Cardiovascular;  Laterality: N/A;  . Cardiac catheterization  ~ 2011  . Dilation and curettage of uterus  "probably"  . Coronary angioplasty      Filed Vitals:   04/01/15 0853  BP: 163/96  Pulse: 71    Visit Diagnosis:  Visual disturbance  Left homonymous hemianopsia  Lack of coordination due to stroke  Left hemiparesis (HCC)  Stiffness of left hand joint  Cognitive deficits following cerebral infarction      Subjective Assessment - 04/01/15 0854    Subjective  "These aren't the right glasses because they are broke, but they might help"  "nothing new"   Patient is accompained by: Family member   Pertinent History CVA 08/19/14, hospitalization in 7/16 for cardiac procedure, see epic snapshot   Limitations hard of hearing, L homonymous hemianopsia   Patient Stated Goals Use LUE more   Currently  in Pain? No/denies            [facility] OT Assessment - 04/01/15 0001    Coordination   Left 9 Hole Peg Test 62.25   Hand Function   Left Hand Grip (lbs) 14                  OT Treatments/Exercises (OP) - 04/01/15 0001    ADLs   ADL Comments Began checking goals and discussing progress.--See goals section (checked 9-hole peg test, grip strength). And discussed renewal.  Pt agrees.   Visual/Perceptual Exercises   Copy this Image Pegboard   Pegboard copied small peg design with mod-max cues and significant incr time.  Pt put pegs in pegboard with L hand with min-mod difficulty for coordination.  Pt  with diffiuclty due to attention and visiual deficits, decr problem solving/organization, and decr attention.   Letter Search 30M size with glasses with 91% accuracy without cueing                  OT Short Term Goals - 04/01/15 0927    OT SHORT TERM GOAL #1   Title Pt will be independent with initial HEP.--check STGs 03/05/15   Time 4   Period Weeks   Status Achieved   OT SHORT TERM GOAL #2   Title Pt will perform tabletop visual scanning with at least 90% using compensation strategies prn without cues.   Baseline 75% with min cues   Time 4   Period Weeks   Status On-going   OT SHORT TERM GOAL #3   Title Pt will improve coordination for ADLs as shown by improving time on 9-hole peg test by at least 10sec with LUE.   Baseline 75.10sec   Time 4   Period Weeks   Status Achieved  03/10/15 103.47sec; 04/01/15 62.25sec   OT SHORT TERM GOAL #4   Title Pt will demo at least 90% gross finger flex for grasping objects with LUE.   Baseline 75%   Time 4   Period Weeks   Status Achieved  approx this level   OT SHORT TERM GOAL #5   Title Pt will be able to complete fasteners mod I consistently.   Time 4   Period Weeks   Status Achieved  per pt report, does with difficulty           OT Long Term Goals - 04/01/15 0901    OT LONG TERM GOAL #1   Title Pt will be independent with updated HEP.--check LTGs 04/04/15   Time 8   Period Weeks   Status On-going  04/01/15:  initial HEP remains appropriate    OT LONG TERM GOAL #2   Title Pt will verbalize understanding of cognitive compensation strategies for ADLs prn.   Time 8   Period Weeks   Status New   OT LONG TERM GOAL #3   Title Pt will perform simple environmental scanning with at least 90% accuracy for improved safety.   Time 8   Period Weeks   Status New   OT LONG TERM GOAL #4   Title Pt will improve coordination for ADLs as shown by improving time on 9-hole peg test by at least 20sec with LUE.   Baseline 75.10sec    Time 8   Period Weeks   Status On-going  Not met 04/01/15  62.25sec   OT LONG TERM GOAL #5   Title Pt will demo at least 15lbs L grip strength to assist in  opening containers.   Baseline 0lbs   Time 8   Period Weeks   Status On-going  Not met 04/01/15  14lbs   OT LONG TERM GOAL #6   Title Pt will perform simple snack prep/meal prep with supervision.   Baseline not performing   Time 8   Period Weeks   Status New               Plan - 04/01/15 6728    Clinical Impression Statement Pt demo improve coordination, L hand strength, and simple tabletop visual scanning today.  As pt is progressing and was not seen frequency, pt would benefit from continued OT.  Pt agrees with plans to renew next visit.   Plan continue checking goals, g-code and renewal next visit   Consulted and Agree with Plan of Care Patient        Problem List Patient Active Problem List   Diagnosis Date Noted  . New-onset angina ([place]) 01/14/2015  . Left homonymous hemianopsia 09/06/2014  . Occipital infarction ([place]) 09/05/2014  . AKI (acute kidney injury) ([place])   . Acute ischemic right MCA stroke ([place]) 08/30/2014  . Acute right PCA stroke ([place]) 08/30/2014  . Abnormal chest x-ray 08/22/2014  . Type 2 diabetes mellitus with diabetic neuropathy ([place]) 01/04/2014  . HYPERCHOLESTEROLEMIA, MILD 05/22/2009  . PERS HX NONCOMPLIANCE W/MED TX PRS HAZARDS HLTH 07/11/2007  . Essential hypertension 05/04/2007  . Osteoarthritis 05/04/2007  . Fibromyalgia 05/04/2007    [facility] 04/01/2015, 9:50 AM  [facility] [address] [place], [place], [address] Phone: [phone]   Fax:  [place], OTR/L 04/01/2015 9:50 AM

## 2015-04-02 NOTE — Progress Notes (Signed)
Loop recorder 

## 2015-04-03 ENCOUNTER — Ambulatory Visit: Payer: Medicare HMO | Admitting: Occupational Therapy

## 2015-04-03 ENCOUNTER — Encounter: Payer: Self-pay | Admitting: Occupational Therapy

## 2015-04-03 ENCOUNTER — Ambulatory Visit: Payer: Medicare HMO | Admitting: Rehabilitative and Restorative Service Providers"

## 2015-04-03 VITALS — BP 141/88 | HR 81

## 2015-04-03 DIAGNOSIS — H53462 Homonymous bilateral field defects, left side: Secondary | ICD-10-CM

## 2015-04-03 DIAGNOSIS — R2681 Unsteadiness on feet: Secondary | ICD-10-CM

## 2015-04-03 DIAGNOSIS — I69319 Unspecified symptoms and signs involving cognitive functions following cerebral infarction: Secondary | ICD-10-CM

## 2015-04-03 DIAGNOSIS — H539 Unspecified visual disturbance: Secondary | ICD-10-CM

## 2015-04-03 DIAGNOSIS — G8194 Hemiplegia, unspecified affecting left nondominant side: Secondary | ICD-10-CM

## 2015-04-03 DIAGNOSIS — R269 Unspecified abnormalities of gait and mobility: Secondary | ICD-10-CM | POA: Diagnosis not present

## 2015-04-03 DIAGNOSIS — IMO0002 Reserved for concepts with insufficient information to code with codable children: Secondary | ICD-10-CM

## 2015-04-03 DIAGNOSIS — R6889 Other general symptoms and signs: Secondary | ICD-10-CM

## 2015-04-03 DIAGNOSIS — M25642 Stiffness of left hand, not elsewhere classified: Secondary | ICD-10-CM

## 2015-04-03 NOTE — Patient Instructions (Signed)
Patient Instructions     Bridging   Slowly raise buttocks from floor, keeping stomach tight. Hold for 5 seconds. Repeat _10___ times per set. Do _1-2___ sessions per day.  http://orth.exer.us/1096   Copyright  VHI. All rights reserved.  Strengthening: Hip Abductor - Resisted   Lying down with red band looped around both legs above knees, push thighs apart. Hold 5 seconds. Repeat 10__ times per set. Do __1-2__ sessions per day.  http://orth.exer.us/688   Copyright  VHI. All rights reserved.  EXTENSION: Prone - Knee Flexed (Active)   Lie on stomach, right knee bent to 90. Lift leg toward ceiling. Repeat for 10 reps. Repeat with other leg. 1-2 sessions a day.  http://gtsc.exer.us/67   Copyright  VHI. All rights reserved.  Functional Quadriceps: Sit to Stand   Sit on edge of chair, feet flat on floor. Stand upright, extending knees fully. Repeat __10__ times per set. Do _1-2___ sessions per day.  http://orth.exer.us/735   Copyright  VHI. All rights reserved.    "I love a Database administrator   At counter for balance as needed: high knee marching forward and then backward. 3 second pauses with each knee lift.  Repeat 3 laps each way. Do _1-2_ sessions per day. http://gt2.exer.us/345   Copyright  VHI. All rights reserved.  Side-Stepping   At counter for balance as needed: Walk to left side with eyes open. Take even steps, leading with same foot. Make sure each foot lifts off the floor. Repeat in opposite direction. Keep feet pointed toward counter. Repeat for 3 laps each way. Do _1-2__ sessions per day.  Copyright  VHI. All rights reserved.  Walking on Heels   At counter: Walk on heels forward while continuing on a straight path, and then walk on heels backward to starting position. Repeat for 3 laps each way. Do _1-2__ sessions per day.  Copyright  VHI. All rights reserved.  Walking on Toes   At counter for balance as needed: Walk on toes  forward while continuing on a straight path, and then backwards on toes to starting position. Repeat 3 laps each way. Do _1-2___ sessions per day.  Copyright  VHI. All rights reserved.  Feet Heel-Toe "Tandem"   At counter: Arms at sides, walk a straight line forward bringing one foot directly in front of the other, and then a straight line backwards bringing one foot directly behind the other one.  Repeat for _3 laps each way. Do _1-2_ sessions per day.  Copyright  VHI. All rights reserved.

## 2015-04-03 NOTE — Therapy (Signed)
[facility] [address] [place], [place], [address] Phone: [phone]   Fax:  [phone]  Occupational Therapy Treatment  Patient Details  Name: Kayla [name] MRN: [id number] Date of Birth: 01/07/1941 Referring Provider:  Hoyt Koch, *  Encounter Date: 04/03/2015      OT End of Session - 04/03/15 0917    Visit Number 10   Number of Visits 26   Date for OT Re-Evaluation 06/02/15   Authorization Type [employer], no visit limit/auth, G-code needed   Authorization Time Period next week is wk 1/8   Authorization - Visit Number 10   Authorization - Number of Visits 20   OT Start Time 0850   OT Stop Time 0930   OT Time Calculation (min) 40 min   Activity Tolerance Patient tolerated treatment well   Behavior During Therapy [facility] for tasks assessed/performed      Past Medical History  Diagnosis Date  . Asthmatic bronchitis   . Hypertension   . Venous insufficiency   . Hypercholesteremia   . Diverticulosis of colon   . Constipation   . Prolapse of vaginal vault after hysterectomy   . Proteinuria   . Fibromyalgia   . Somatic dysfunction   . Anxiety   . Personal history of noncompliance with medical treatment, presenting hazards to health   . Sickle-cell trait ([place])   . Allergy     Shell fish  . GERD (gastroesophageal reflux disease)   . Heart murmur   . Coronary artery disease   . Type II diabetes mellitus ([place])   . Blind left eye   . DJD (degenerative joint disease)   . Arthritis     "knees, back, probably left hand" (01/14/2015)  . History of gout   . Depression   . Stroke [facility]) ~ 08/2014    "blind in left eye; weak on left side since" (01/14/2015)    Past Surgical History  Procedure Laterality Date  . Abdominal hysterectomy    . Robotic assisted laparoscopic sacrocolpopexy  09/2009    Dr. Matilde Sprang  . Cataract extraction w/ intraocular lens  implant, bilateral Bilateral 2012  . Tee  without cardioversion N/A 09/02/2014    Procedure: TRANSESOPHAGEAL ECHOCARDIOGRAM (TEE);  Surgeon: Lelon Perla, MD;  Location: [facility] ENDOSCOPY;  Service: Cardiovascular;  Laterality: N/A;  . Loop recorder implant N/A 09/03/2014    Procedure: LOOP RECORDER IMPLANT;  Surgeon: Thompson Grayer, MD;  Location: [facility] CATH LAB;  Service: Cardiovascular;  Laterality: N/A;  . Cardiac catheterization N/A 01/14/2015    Procedure: Left Heart Cath and Coronary Angiography;  Surgeon: Charolette Forward, MD;  Location: [facility] CV LAB;  Service: Cardiovascular;  Laterality: N/A;  . Cardiac catheterization  ~ 2011  . Dilation and curettage of uterus  "probably"  . Coronary angioplasty      There were no vitals filed for this visit.  Visit Diagnosis:  Visual disturbance  Left homonymous hemianopsia  Cognitive deficits following cerebral infarction  Lack of coordination due to stroke  Left hemiparesis (HCC)  Stiffness of left hand joint  Unsteadiness  Decreased activity tolerance      Subjective Assessment - 04/03/15 0915    Subjective  "stiff this morning"   Pertinent History CVA 08/19/14, hospitalization in 7/16 for cardiac procedure, see epic snapshot   Limitations hard of hearing, L homonymous hemianopsia   Patient Stated Goals Use LUE more   Currently in Pain? No/denies  OT Treatments/Exercises (OP) - 04/03/15 0001    Visual/Perceptual Exercises   Letter Search 36M size without glasses (pt forgot) with 91-96% accuracy without cueing   Scanning Environmental;Tabletop   Scanning - Environmental x2 with approx 30% accuracy each time.  Pt missed items on both sides, but L>R   Scanning - Tabletop scanning to copy flower designs with mod cues to look to the L and for problem solving/attention to task                  OT Short Term Goals - 04/03/15 0922    OT SHORT TERM GOAL #1   Title Pt will be independent with initial HEP.--check STGs 03/05/15   Time  4   Period Weeks   Status Achieved   OT SHORT TERM GOAL #2   Title Pt will perform tabletop visual scanning with at least 90% using compensation strategies prn without cues.   Baseline 75% with min cues   Time 4   Period Weeks   Status Achieved  04/03/15 approx 91-96%   OT SHORT TERM GOAL #3   Title Pt will improve coordination for ADLs as shown by improving time on 9-hole peg test by at least 10sec with LUE.   Baseline 75.10sec   Time 4   Period Weeks   Status Achieved  03/10/15 103.47sec; 04/01/15 62.25sec   OT SHORT TERM GOAL #4   Title Pt will demo at least 90% gross finger flex for grasping objects with LUE.   Baseline 75%   Time 4   Period Weeks   Status Achieved  approx this level   OT SHORT TERM GOAL #5   Title Pt will be able to complete fasteners mod I consistently.   Time 4   Period Weeks   Status Achieved  per pt report, does with difficulty   Additional Short Term Goals   Additional Short Term Goals Yes   OT SHORT TERM GOAL #6   Title Pt will perform simple environmental scanning with at least 50% accuracy for improved safety.--check 05/03/15   Time 4   Period Weeks   Status New   OT SHORT TERM GOAL #7   Title Pt will improve coordination for ADLs as shown by improving time on 9-hole peg test to less than 55sec with LUE.--check 05/03/15   Time 4   Period Weeks   Status New   OT SHORT TERM GOAL #8   Title Pt will demo at least 20lbs L grip strength to assist in opening containers.--check 05/03/15   Time 4   Period Weeks   Status New           OT Long Term Goals - 04/03/15 0924    OT LONG TERM GOAL #1   Title Pt will be independent with updated HEP.--check 06/02/15   Time 8   Period Weeks   Status On-going  04/01/15:  initial HEP remains appropriate    OT LONG TERM GOAL #2   Title Pt will verbalize understanding of cognitive compensation strategies for ADLs prn.--check 06/02/15   Time 8   Period Weeks   Status On-going  04/03/15  not met/not  fully addressed   OT LONG TERM GOAL #3   Title Pt will perform simple environmental scanning with at least 75% accuracy for improved safety.--check 06/02/15   Time 8   Period Weeks   Status Revised  04/03/15 approx 30%    OT LONG TERM GOAL #4   Title Pt will improve  coordination for ADLs as shown by improving time on 9-hole peg test to less than 50sec with LUE.--check 06/02/15   Baseline 75.10sec   Time 8   Period Weeks   Status Revised  Not met 04/01/15  62.25sec   OT LONG TERM GOAL #5   Title Pt will demo at least 25lbs L grip strength to assist in opening containers.--check 06/02/15   Baseline 0lbs   Time 8   Period Weeks   Status Revised  Not met 04/01/15  14lbs   OT LONG TERM GOAL #6   Title Pt will perform simple snack prep/meal prep with supervision.--check 06/02/15   Baseline not performing   Time 8   Period Weeks   Status On-going  April 11, 2015 not met.               Plan - 2015-04-11 1253    Clinical Impression Statement Pt demo improved tabletop visual scanning, but continues to demo significant difficulty with environmental scanning with impacts safety.  L hand coordination and strength has also improved.  Decr family assistance/carryover for therapy recommendations is a barrier due to cognitive deficits.  All STGs met, LTGs not met.  Pt would benefit from further occupational therapy as she is progressing and has not been seen for frequency to maximize ADL performance, improve QOL and decr caregiver burden.  Pt agrees with plan for continued OT.   Pt will benefit from skilled therapeutic intervention in order to improve on the following deficits (Retired) Decreased cognition;Decreased balance;Decreased strength;Impaired vision/preception;Impaired UE functional use;Decreased range of motion;Decreased coordination;Decreased activity tolerance;Decreased knowledge of use of DME   Rehab Potential Good   OT Frequency 2x / week   OT Duration 8 weeks   OT  Treatment/Interventions Self-care/ADL training;Cryotherapy;Electrical Stimulation;Parrafin;Energy conservation;Manual Therapy;Visual/perceptual remediation/compensation;Cognitive remediation/compensation;Passive range of motion;Functional Mobility Training;Neuromuscular education;Ultrasound;Therapeutic exercise;Therapeutic activities;Therapeutic exercises;Moist Heat;Fluidtherapy;DME and/or AE instruction;Splinting;Patient/family education;Contrast Bath   Plan continue with strength, coordination, visual scanning, and cogntion for ADLs; cognitive compensation strategies/activities next session   Consulted and Agree with Plan of Care Patient          G-Codes - 04/11/15 9323    Functional Assessment Tool Used L 9-hole peg test:  62.25sec, L grip strength:  14lbs, approx 90% L gross finger flex   Functional Limitation Carrying, moving and handling objects   Carrying, Moving and Handling Objects Current Status (F5732) At least 40 percent but less than 60 percent impaired, limited or restricted   Carrying, Moving and Handling Objects Goal Status (K0254) At least 20 percent but less than 40 percent impaired, limited or restricted      Occupational Therapy Progress Note  Dates of Reporting Period: 02/03/15 to 04-11-2015  Objective Reports of Subjective Statement: 10  Objective Measurements: see above  Goal Update: see above  Plan: see above  Reason Skilled Services are Required: see above   Problem List Patient Active Problem List   Diagnosis Date Noted  . New-onset angina ([place]) 01/14/2015  . Left homonymous hemianopsia 09/06/2014  . Occipital infarction ([place]) 09/05/2014  . AKI (acute kidney injury) ([place])   . Acute ischemic right MCA stroke ([place]) 08/30/2014  . Acute right PCA stroke ([place]) 08/30/2014  . Abnormal chest x-ray 08/22/2014  . Type 2 diabetes mellitus with diabetic neuropathy ([place]) 01/04/2014  . HYPERCHOLESTEROLEMIA, MILD 05/22/2009  . PERS HX NONCOMPLIANCE W/MED TX PRS  HAZARDS HLTH 07/11/2007  . Essential hypertension 05/04/2007  . Osteoarthritis 05/04/2007  . Fibromyalgia 05/04/2007    Lexy Meininger 04/11/15, 1:21 PM  [facility]  [facility] [address] [place], [place], [address] Phone: [phone]   Fax:  [place], OTR/L 04/03/2015 1:21 PM

## 2015-04-03 NOTE — Therapy (Signed)
[facility] [address] [place], [place], [address] Phone: [phone]   Fax:  [phone]  Physical Therapy Treatment  Patient Details  Name: Kayla [name] MRN: [id number] Date of Birth: [birth year] Referring Provider:  Hoyt Koch, *  Encounter Date: 04/03/2015      PT End of Session - 04/03/15 1441    Visit Number 12   Number of Visits 16   Date for PT Re-Evaluation 04/05/15   Authorization Type G code every 10th visit, copay   PT Start Time 0934   PT Stop Time 1004   PT Time Calculation (min) 30 min   Equipment Utilized During Treatment --   Activity Tolerance Patient tolerated treatment well   Behavior During Therapy Flat affect      Past Medical History  Diagnosis Date  . Asthmatic bronchitis   . Hypertension   . Venous insufficiency   . Hypercholesteremia   . Diverticulosis of colon   . Constipation   . Prolapse of vaginal vault after hysterectomy   . Proteinuria   . Fibromyalgia   . Somatic dysfunction   . Anxiety   . Personal history of noncompliance with medical treatment, presenting hazards to health   . Sickle-cell trait ([place])   . Allergy     Shell fish  . GERD (gastroesophageal reflux disease)   . Heart murmur   . Coronary artery disease   . Type II diabetes mellitus ([place])   . Blind left eye   . DJD (degenerative joint disease)   . Arthritis     "knees, back, probably left hand" (01/14/2015)  . History of gout   . Depression   . Stroke [facility]) ~ 08/2014    "blind in left eye; weak on left side since" (01/14/2015)    Past Surgical History  Procedure Laterality Date  . Abdominal hysterectomy    . Robotic assisted laparoscopic sacrocolpopexy  09/2009    Dr. Matilde Sprang  . Cataract extraction w/ intraocular lens  implant, bilateral Bilateral 2012  . Tee without cardioversion N/A 09/02/2014    Procedure: TRANSESOPHAGEAL ECHOCARDIOGRAM (TEE);  Surgeon: Lelon Perla, MD;   Location: [facility] ENDOSCOPY;  Service: Cardiovascular;  Laterality: N/A;  . Loop recorder implant N/A 09/03/2014    Procedure: LOOP RECORDER IMPLANT;  Surgeon: Thompson Grayer, MD;  Location: [facility] CATH LAB;  Service: Cardiovascular;  Laterality: N/A;  . Cardiac catheterization N/A 01/14/2015    Procedure: Left Heart Cath and Coronary Angiography;  Surgeon: Charolette Forward, MD;  Location: [place] CV LAB;  Service: Cardiovascular;  Laterality: N/A;  . Cardiac catheterization  ~ 2011  . Dilation and curettage of uterus  "probably"  . Coronary angioplasty      Filed Vitals:   04/03/15 0936  BP: 141/88  Pulse: 81    Visit Diagnosis:  Abnormality of gait      Subjective Assessment - 04/03/15 0936    Subjective The patient reports her BP was high last night and she took her meds.  She did not take her blood pressure this morning.       THERAPEUTIC EXERCISE: Reviewed all HEP and discussed continuing after d/c.        PT Education - 04/03/15 1441    Education provided Yes   Education Details reviewed all HEP and educated patient on importance of continuing.  Educated patient on fall risk   Person(s) Educated Patient   Methods Explanation;Demonstration;Handout   Comprehension Verbalized understanding  PT Short Term Goals - 03/10/15 1024    PT SHORT TERM GOAL #1   Title The patient will be able to perform HEP with assist from family.   Baseline Patient has HEP, but not performing regularly.   Time 4   Period Weeks   Status Partially Met   PT SHORT TERM GOAL #2   Title The patient will improve Berg score from 33/56 to > or equal to 40/56 to demo decreased risk for fall.   Baseline Met on 03/10/2015 with patient scoring 45/56.   Time 4   Period Weeks   Status Achieved   PT SHORT TERM GOAL #3   Title The patient will improve ambulation speed from 1.98 ft/sec to > or equal to 2.4 ft/sec to demo improving mobility.   Baseline Pt improved to 2.23 ft/sec for gait speed on 03/10/2015    Time 4   Period Weeks   Status Partially Met   PT SHORT TERM GOAL #4   Title The patient will negotiate 4 steps with close supervision with bilateral handrails and step to pattern.   Baseline Pt performs reciprocal pattern with one handrail and 4 steps with supervision   Time 4   Period Weeks   Status Achieved           PT Long Term Goals - 04/03/15 1442    PT LONG TERM GOAL #1   Title The patient will improve functional status score from 50% up to > or equal to 62% for improved perception of disability.   Baseline Patient's daughter completed subjective survey on eval date and patient completed--she showed a decline in score from 50% to 46%   Time 8   Period Weeks   Status Not Met   PT LONG TERM GOAL #2   Title The patient will improve Berg score from 33/56 to > or equal to 43/56 to demo decreasing risk for falls.   Baseline Patient scores 45/56 on Berg balance scale.     Time 8   Period Weeks   Status Achieved   PT LONG TERM GOAL #3   Title The patient will improve endurance by increasing ambulation distance from 200 ft nonstop to > than or equal to 400 ft nonstop.   Baseline Patient ambulates >500 feet nonstop in PT 03/19/2015.   Time 8   Period Weeks   Status Achieved   PT LONG TERM GOAL #4   Title The patient will improve gait speed from 1.98 ft/sec to > or equal to 2.62 ft/sec to demo transition to full community ambulator classification of gait.   Baseline Met on 03/19/2015 scoring 3.03 ft/sec.   Time 8   Period Weeks   Status Achieved               Plan - 04/03/15 1442    Clinical Impression Statement The patient met majority of goals in PT.  She continues to be a high fall risk due to inattention, decreased vision L side.  The patient is not carrying over recommendations to home for exercise, use of cane, etc.  PT educated the patient's husband on assisting her, however the patient verbally reports that he thinks they are her exercises and does not assist  her with them in the home environment.  The patient reports she cannot remember to complete these.   PT Next Visit Plan discharge.   Consulted and Agree with Plan of Care Patient;Family member/caregiver   Family Member Consulted spouse  PHYSICAL THERAPY DISCHARGE SUMMARY  Visits from Start of Care: 12  Current functional level related to goals / functional outcomes: See above goals   Remaining deficits: Inattention, L visual field cut Abnormality of gait Generalized weakness   Education / Equipment: HEP, safety in home, CVA education.  Plan: Patient agrees to discharge.  Patient goals were partially met. Patient is being discharged due to meeting the stated rehab goals.  ?????       *The patient is not carryover over recommendations to home environment and does not appear to have family support to complete exercises.  PT with limited further benefit due to lack of carryover.    Problem List Patient Active Problem List   Diagnosis Date Noted  . New-onset angina ([place]) 01/14/2015  . Left homonymous hemianopsia 09/06/2014  . Occipital infarction ([place]) 09/05/2014  . AKI (acute kidney injury) ([place])   . Acute ischemic right MCA stroke ([place]) 08/30/2014  . Acute right PCA stroke ([place]) 08/30/2014  . Abnormal chest x-ray 08/22/2014  . Type 2 diabetes mellitus with diabetic neuropathy ([place]) 01/04/2014  . HYPERCHOLESTEROLEMIA, MILD 05/22/2009  . PERS HX NONCOMPLIANCE W/MED TX PRS HAZARDS HLTH 07/11/2007  . Essential hypertension 05-23-2007  . Osteoarthritis 05/23/07  . Fibromyalgia 05-23-2007        G-Codes - 04/22/15 1450    Functional Assessment Tool Used Berg=45/56.   Functional Limitation Mobility: Walking and moving around   Mobility: Walking and Moving Around Goal Status [phone]) At least 20 percent but less than 40 percent impaired, limited or restricted   Mobility: Walking and Moving Around Discharge Status [phone]) At least 20 percent but less than 40 percent  impaired, limited or restricted      Thank you for the referral of this patient. Rudell Cobb, MPT   [place], [place] Apr 22, 2015, 2:44 PM  [facility] [address] [place], [place], [address] Phone: [phone]   Fax:  [phone]

## 2015-04-08 ENCOUNTER — Ambulatory Visit: Payer: Medicare HMO | Admitting: Rehabilitative and Restorative Service Providers"

## 2015-04-08 ENCOUNTER — Ambulatory Visit: Payer: Medicare HMO | Admitting: Occupational Therapy

## 2015-04-08 DIAGNOSIS — R269 Unspecified abnormalities of gait and mobility: Secondary | ICD-10-CM | POA: Diagnosis not present

## 2015-04-08 DIAGNOSIS — H53462 Homonymous bilateral field defects, left side: Secondary | ICD-10-CM

## 2015-04-08 DIAGNOSIS — G8194 Hemiplegia, unspecified affecting left nondominant side: Secondary | ICD-10-CM

## 2015-04-08 DIAGNOSIS — IMO0002 Reserved for concepts with insufficient information to code with codable children: Secondary | ICD-10-CM

## 2015-04-08 DIAGNOSIS — H539 Unspecified visual disturbance: Secondary | ICD-10-CM

## 2015-04-08 DIAGNOSIS — I69319 Unspecified symptoms and signs involving cognitive functions following cerebral infarction: Secondary | ICD-10-CM

## 2015-04-08 NOTE — Therapy (Addendum)
[facility] [address] [place], [place], [address] Phone: [phone]   Fax:  [phone]  Occupational Therapy Treatment  Patient Details  Name: Kayla [name] MRN: [id number] Date of Birth: 1940/08/13 No Data Recorded  Encounter Date: 04/08/2015      OT End of Session - 04/08/15 0858    Visit Number 11   Number of Visits 26   Date for OT Re-Evaluation 06/02/15   Authorization Type [employer] Medicare, no visit limit/auth, G-code needed   Authorization Time Period wk 1/8   Authorization - Visit Number 11   Authorization - Number of Visits 20   OT Start Time 0848   OT Stop Time 0930   OT Time Calculation (min) 42 min   Activity Tolerance Patient tolerated treatment well   Behavior During Therapy [facility] for tasks assessed/performed      Past Medical History  Diagnosis Date  . Asthmatic bronchitis   . Hypertension   . Venous insufficiency   . Hypercholesteremia   . Diverticulosis of colon   . Constipation   . Prolapse of vaginal vault after hysterectomy   . Proteinuria   . Fibromyalgia   . Somatic dysfunction   . Anxiety   . Personal history of noncompliance with medical treatment, presenting hazards to health   . Sickle-cell trait ([place])   . Allergy     Shell fish  . GERD (gastroesophageal reflux disease)   . Heart murmur   . Coronary artery disease   . Type II diabetes mellitus ([place])   . Blind left eye   . DJD (degenerative joint disease)   . Arthritis     "knees, back, probably left hand" (01/14/2015)  . History of gout   . Depression   . Stroke [facility]) ~ 08/2014    "blind in left eye; weak on left side since" (01/14/2015)    Past Surgical History  Procedure Laterality Date  . Abdominal hysterectomy    . Robotic assisted laparoscopic sacrocolpopexy  09/2009    Dr. Matilde Sprang  . Cataract extraction w/ intraocular lens  implant, bilateral Bilateral 2012  . Tee without cardioversion N/A 09/02/2014     Procedure: TRANSESOPHAGEAL ECHOCARDIOGRAM (TEE);  Surgeon: Lelon Perla, MD;  Location: [facility] ENDOSCOPY;  Service: Cardiovascular;  Laterality: N/A;  . Loop recorder implant N/A 09/03/2014    Procedure: LOOP RECORDER IMPLANT;  Surgeon: Thompson Grayer, MD;  Location: [facility] CATH LAB;  Service: Cardiovascular;  Laterality: N/A;  . Cardiac catheterization N/A 01/14/2015    Procedure: Left Heart Cath and Coronary Angiography;  Surgeon: Charolette Forward, MD;  Location: [place] CV LAB;  Service: Cardiovascular;  Laterality: N/A;  . Cardiac catheterization  ~ 2011  . Dilation and curettage of uterus  "probably"  . Coronary angioplasty      There were no vitals filed for this visit.  Visit Diagnosis:  Left hemiparesis ([place])  Lack of coordination due to stroke  Left homonymous hemianopsia  Visual disturbance  Cognitive deficits following cerebral infarction      Subjective Assessment - 04/08/15 0853    Subjective  Pt reports that she has been doing HEP at home.  Pt reports that her husband does not like to attend therapy because he does not like to see her struggle.   Pertinent History CVA 08/19/14, hospitalization in 7/16 for cardiac procedure, see epic snapshot   Limitations hard of hearing, L homonymous hemianopsia   Patient Stated Goals Use LUE more   Currently in Pain?  No/denies                      OT Treatments/Exercises (OP) - 04/08/15 0001    Hand Exercises   Other Hand Exercises Picking up blocks using 15lbs sustained grip strength with mod difficulty and min cues.  Picked blocks up by color for visual scanning/awareness to the L side.  No cues needed for scanning to the L during activity, but needed incr time.   Fine Motor Coordination   Fine Motor Coordination Purdue Pegboard   Purdue Pegboard completed with LUE with min-mod difficulty for coordination and increased time     Self-care:  Discussed cognitive changes due to CVA to incr awareness.  Pt verbalized  understanding.            OT Education - 04/08/15 1009    Education Details Cognitive compensation strategies/Ways to work on Mining engineer at home with family; Changes after CVA (including emotional, cognitive changes)   Person(s) Educated Patient;Spouse  Reviewed briefly with husband at end of session and encouraged questions but husband reports that he does not need to review   Methods Explanation;Handout   Comprehension Verbalized understanding          OT Short Term Goals - 04/03/15 0922    OT SHORT TERM GOAL #1   Title Pt will be independent with initial HEP.--check STGs 03/05/15   Time 4   Period Weeks   Status Achieved   OT SHORT TERM GOAL #2   Title Pt will perform tabletop visual scanning with at least 90% using compensation strategies prn without cues.   Baseline 75% with min cues   Time 4   Period Weeks   Status Achieved  04/03/15 approx 91-96%   OT SHORT TERM GOAL #3   Title Pt will improve coordination for ADLs as shown by improving time on 9-hole peg test by at least 10sec with LUE.   Baseline 75.10sec   Time 4   Period Weeks   Status Achieved  03/10/15 103.47sec; 04/01/15 62.25sec   OT SHORT TERM GOAL #4   Title Pt will demo at least 90% gross finger flex for grasping objects with LUE.   Baseline 75%   Time 4   Period Weeks   Status Achieved  approx this level   OT SHORT TERM GOAL #5   Title Pt will be able to complete fasteners mod I consistently.   Time 4   Period Weeks   Status Achieved  per pt report, does with difficulty   Additional Short Term Goals   Additional Short Term Goals Yes   OT SHORT TERM GOAL #6   Title Pt will perform simple environmental scanning with at least 50% accuracy for improved safety.--check 05/03/15   Time 4   Period Weeks   Status New   OT SHORT TERM GOAL #7   Title Pt will improve coordination for ADLs as shown by improving time on 9-hole peg test to less than 55sec with LUE.--check 05/03/15   Time 4    Period Weeks   Status New   OT SHORT TERM GOAL #8   Title Pt will demo at least 20lbs L grip strength to assist in opening containers.--check 05/03/15   Time 4   Period Weeks   Status New           OT Long Term Goals - 04/03/15 0924    OT LONG TERM GOAL #1   Title Pt will be independent with  updated HEP.--check 06/02/15   Time 8   Period Weeks   Status On-going  04/01/15:  initial HEP remains appropriate    OT LONG TERM GOAL #2   Title Pt will verbalize understanding of cognitive compensation strategies for ADLs prn.--check 06/02/15   Time 8   Period Weeks   Status On-going  04/03/15  not met/not fully addressed   OT LONG TERM GOAL #3   Title Pt will perform simple environmental scanning with at least 75% accuracy for improved safety.--check 06/02/15   Time 8   Period Weeks   Status Revised  04/03/15 approx 30%    OT LONG TERM GOAL #4   Title Pt will improve coordination for ADLs as shown by improving time on 9-hole peg test to less than 50sec with LUE.--check 06/02/15   Baseline 75.10sec   Time 8   Period Weeks   Status Revised  Not met 04/01/15  62.25sec   OT LONG TERM GOAL #5   Title Pt will demo at least 25lbs L grip strength to assist in opening containers.--check 06/02/15   Baseline 0lbs   Time 8   Period Weeks   Status Revised  Not met 04/01/15  14lbs   OT LONG TERM GOAL #6   Title Pt will perform simple snack prep/meal prep with supervision.--check 06/02/15   Baseline not performing   Time 8   Period Weeks   Status On-going  04/03/15 not met.               Plan - 04/08/15 1824    Clinical Impression Statement Pt progressing with L grip strength and coordination.  Pt verbalizes understanding of cognitive HEP/recommendation, but pt reports that husband has difficulty adjusting to her changes after CVA and does not like to see her struggle (has not attended therapy sessions recently).   Plan continue with strength, coordination, visual scanning,  and cognition for ADLs   Consulted and Agree with Plan of Care Patient        Problem List Patient Active Problem List   Diagnosis Date Noted  . New-onset angina ([place]) 01/14/2015  . Left homonymous hemianopsia 09/06/2014  . Occipital infarction ([place]) 09/05/2014  . AKI (acute kidney injury) ([place])   . Acute ischemic right MCA stroke ([place]) 08/30/2014  . Acute right PCA stroke ([place]) 08/30/2014  . Abnormal chest x-ray 08/22/2014  . Type 2 diabetes mellitus with diabetic neuropathy ([place]) 01/04/2014  . HYPERCHOLESTEROLEMIA, MILD 05/22/2009  . PERS HX NONCOMPLIANCE W/MED TX PRS HAZARDS HLTH 07/11/2007  . Essential hypertension 05/04/2007  . Osteoarthritis 05/04/2007  . Fibromyalgia 05/04/2007    [facility] 04/08/2015, 6:34 PM  [facility] [address] [place], [place], [address] Phone: [phone]   Fax:  [phone]  Name: KHAMIL LAMICA MRN: [id number] Date of Birth: Dec 20, 1940  Vianne Bulls, OTR/L 04/08/2015 6:35 PM

## 2015-04-08 NOTE — Patient Instructions (Addendum)
COGNITIVE TIPS:  Way family can help encourage Ms. Meiklejohn to improve thinking skills    Remind Ms. Huckeby to bring reading glasses to therapy.  Keep a journal/notebook with sections for the following (or use sections separately as needed):  calendar and appointment sheet, schedule for each day, lists of reminders (such as grocery lists or "to do" list), homework assignments for therapy, important information such as family and friends names/ addresses/ phone numbers, medications, etc. --Will need help for set-up and reminders to use (make sure writing is large so that she can see it)  Help Ms. Sobczak to load a medication organizer and set timer to reminder her to take medication or leave sticky note by breakfast spot reminding her to take medication.  Help Ms. Tirone locate items easier and reduce confusion by avoid/removing clutter and unnecessary items from areas such as countertops/cabinets in kitchen and bathroom, closets, etc.  Organize items by purpose.  Baskets and bins help with this.   Leave notes for Ms. Mangieri as reminders above task to be completed.  For example:  Note to turn off stove over the stove; note to take glasses to therapy by the door, note to brush teeth then wash face by sink, note to take medication on table, note to complete exercises where she will see them, etc.  To help Ms. Halib[place]ton recall names of people or things: Give her the first letter by asking, "does it start with an "A," "B," "C," etc." (or have them give you the first sound of the word or some other clue).  Review family events, occasions, names, etc.  Pictures are a good way to trigger memory.  Have others correct you if you answer something incorrectly.  Have them speak slowly with a few words to give you time to process and respond since Ms. Askari is having trouble with focus and memory due to the stroke.  Don't automatically problem-solve for her.  (For example:  don't let them automatically lay  out clothes in the correct position, but hand it to you folded so that you can figure it out for yourself.)  However, if you need help with tasks, they should give you as little as they can so that you can be successful.  If appropriate and safe, they may allow you to make mistakes so that you can figure out how to correct the error.  (For example, they may allow you to put your shoes on the wrong foot to see if you notice that is wrong).  If you struggle, they should give you a cue.  (Example:  "Do your shoes feel right?"  "Do they look right?")   Try to look for specific things that someone asks you to in the grocery store and try to pay with correct change if able with HELP/supervision.    Watch a Database administrator and discuss it with someone.  Let someone quiz you about it. Let someone ask you about your day or your week.  (Example:  "What did you do today?"  What did you have for breakfast today?" etc.)  Try memorize words of a song, poem, or Bible verse with help.    Try to use the phone book when needed.  Make phone calls to friends/family members (with help if needed).  Other activities to encourage problem-solving, planning, concentration, and memory are as follows:    Crafts with repetition such as:  latch hook, leather kits, cross-stitch, macram, crochet, embroidery, jewelry making, wood working kits (if  safe), models, knitting, quilting, stenciling, etc Board and card games are also good.  Start with easier games.  Let someone teach it to you and then try to teach it to them the next time.  Games such as:  card games, scrabble, yahtzee, pay day, life, boggle, monopoly, checkers, backgammon, connect four, matching games, etc.

## 2015-04-10 ENCOUNTER — Ambulatory Visit: Payer: Medicare HMO | Admitting: Occupational Therapy

## 2015-04-10 ENCOUNTER — Ambulatory Visit: Payer: Medicare HMO | Admitting: Rehabilitative and Restorative Service Providers"

## 2015-04-10 DIAGNOSIS — R269 Unspecified abnormalities of gait and mobility: Secondary | ICD-10-CM | POA: Diagnosis not present

## 2015-04-10 DIAGNOSIS — M25642 Stiffness of left hand, not elsewhere classified: Secondary | ICD-10-CM

## 2015-04-10 DIAGNOSIS — IMO0002 Reserved for concepts with insufficient information to code with codable children: Secondary | ICD-10-CM

## 2015-04-10 DIAGNOSIS — G8194 Hemiplegia, unspecified affecting left nondominant side: Secondary | ICD-10-CM

## 2015-04-10 DIAGNOSIS — H539 Unspecified visual disturbance: Secondary | ICD-10-CM

## 2015-04-10 DIAGNOSIS — H53462 Homonymous bilateral field defects, left side: Secondary | ICD-10-CM

## 2015-04-10 DIAGNOSIS — I69319 Unspecified symptoms and signs involving cognitive functions following cerebral infarction: Secondary | ICD-10-CM

## 2015-04-10 NOTE — Therapy (Signed)
[facility] [address] [place], [place], [address] Phone: [phone]   Fax:  [phone]  Occupational Therapy Treatment  Patient Details  Name: Kayla [name] MRN: [id number] Date of Birth: 1940/12/28 No Data Recorded  Encounter Date: 04/10/2015      OT End of Session - 04/10/15 0946    Visit Number 12   Number of Visits 26   Date for OT Re-Evaluation 06/02/15   Authorization Type [employer] Medicare, no visit limit/auth, G-code needed   Authorization Time Period wk 1/8   Authorization - Visit Number 12   Authorization - Number of Visits 20   OT Start Time [phone]   OT Stop Time 1015   OT Time Calculation (min) 44 min   Activity Tolerance Patient tolerated treatment well   Behavior During Therapy [facility] for tasks assessed/performed      Past Medical History  Diagnosis Date  . Asthmatic bronchitis   . Hypertension   . Venous insufficiency   . Hypercholesteremia   . Diverticulosis of colon   . Constipation   . Prolapse of vaginal vault after hysterectomy   . Proteinuria   . Fibromyalgia   . Somatic dysfunction   . Anxiety   . Personal history of noncompliance with medical treatment, presenting hazards to health   . Sickle-cell trait ([place])   . Allergy     Shell fish  . GERD (gastroesophageal reflux disease)   . Heart murmur   . Coronary artery disease   . Type II diabetes mellitus ([place])   . Blind left eye   . DJD (degenerative joint disease)   . Arthritis     "knees, back, probably left hand" (01/14/2015)  . History of gout   . Depression   . Stroke [facility]) ~ 08/2014    "blind in left eye; weak on left side since" (01/14/2015)    Past Surgical History  Procedure Laterality Date  . Abdominal hysterectomy    . Robotic assisted laparoscopic sacrocolpopexy  09/2009    Dr. Matilde Sprang  . Cataract extraction w/ intraocular lens  implant, bilateral Bilateral 2012  . Tee without cardioversion N/A 09/02/2014     Procedure: TRANSESOPHAGEAL ECHOCARDIOGRAM (TEE);  Surgeon: Lelon Perla, MD;  Location: [facility] ENDOSCOPY;  Service: Cardiovascular;  Laterality: N/A;  . Loop recorder implant N/A 09/03/2014    Procedure: LOOP RECORDER IMPLANT;  Surgeon: Thompson Grayer, MD;  Location: [facility] CATH LAB;  Service: Cardiovascular;  Laterality: N/A;  . Cardiac catheterization N/A 01/14/2015    Procedure: Left Heart Cath and Coronary Angiography;  Surgeon: Charolette Forward, MD;  Location: [place] CV LAB;  Service: Cardiovascular;  Laterality: N/A;  . Cardiac catheterization  ~ 2011  . Dilation and curettage of uterus  "probably"  . Coronary angioplasty      There were no vitals filed for this visit.  Visit Diagnosis:  Visual disturbance  Left homonymous hemianopsia  Lack of coordination due to stroke  Stiffness of left hand joint  Cognitive deficits following cerebral infarction  Left hemiparesis [facility])      Subjective Assessment - 04/10/15 0944    Subjective  "My hand feels a little stiff"                              Husband reports no questions regarding education/handouts from last session   Pertinent History CVA 08/19/14, hospitalization in 7/16 for cardiac procedure, see epic snapshot   Limitations  hard of hearing, L homonymous hemianopsia   Patient Stated Goals Use LUE more   Currently in Pain? No/denies                      OT Treatments/Exercises (OP) - 04/10/15 0001    Visual/Perceptual Exercises   Pegboard copied medium peg design with L hand with min difficulty with coordination/incr time, mod cueing for copying design.   Scanning - Tabletop to locate card matches with incr time and min cueing for directions initially, but then no cues.  Pt initiated looking to the L/edge of table and initiated using LUE.   Other Exercises Pt observed to look for edge of table for visual compensation strategy during tabletop scanning without cues multiple times today, but not consistent.  (improved)    Functional Reaching Activities   High Level To place/remove clothespins with 1-8lb resistance on vertical pole with LUE for coordination/strength and visual scanning (for specific colors).      Discussed performance for incr awareness and for encouragement.             OT Short Term Goals - 04/03/15 0922    OT SHORT TERM GOAL #1   Title Pt will be independent with initial HEP.--check STGs 03/05/15   Time 4   Period Weeks   Status Achieved   OT SHORT TERM GOAL #2   Title Pt will perform tabletop visual scanning with at least 90% using compensation strategies prn without cues.   Baseline 75% with min cues   Time 4   Period Weeks   Status Achieved  04/03/15 approx 91-96%   OT SHORT TERM GOAL #3   Title Pt will improve coordination for ADLs as shown by improving time on 9-hole peg test by at least 10sec with LUE.   Baseline 75.10sec   Time 4   Period Weeks   Status Achieved  03/10/15 103.47sec; 04/01/15 62.25sec   OT SHORT TERM GOAL #4   Title Pt will demo at least 90% gross finger flex for grasping objects with LUE.   Baseline 75%   Time 4   Period Weeks   Status Achieved  approx this level   OT SHORT TERM GOAL #5   Title Pt will be able to complete fasteners mod I consistently.   Time 4   Period Weeks   Status Achieved  per pt report, does with difficulty   Additional Short Term Goals   Additional Short Term Goals Yes   OT SHORT TERM GOAL #6   Title Pt will perform simple environmental scanning with at least 50% accuracy for improved safety.--check 05/03/15   Time 4   Period Weeks   Status New   OT SHORT TERM GOAL #7   Title Pt will improve coordination for ADLs as shown by improving time on 9-hole peg test to less than 55sec with LUE.--check 05/03/15   Time 4   Period Weeks   Status New   OT SHORT TERM GOAL #8   Title Pt will demo at least 20lbs L grip strength to assist in opening containers.--check 05/03/15   Time 4   Period Weeks   Status New            OT Long Term Goals - 04/03/15 0924    OT LONG TERM GOAL #1   Title Pt will be independent with updated HEP.--check 06/02/15   Time 8   Period Weeks   Status On-going  04/01/15:  initial HEP remains  appropriate    OT LONG TERM GOAL #2   Title Pt will verbalize understanding of cognitive compensation strategies for ADLs prn.--check 06/02/15   Time 8   Period Weeks   Status On-going  04/03/15  not met/not fully addressed   OT LONG TERM GOAL #3   Title Pt will perform simple environmental scanning with at least 75% accuracy for improved safety.--check 06/02/15   Time 8   Period Weeks   Status Revised  04/03/15 approx 30%    OT LONG TERM GOAL #4   Title Pt will improve coordination for ADLs as shown by improving time on 9-hole peg test to less than 50sec with LUE.--check 06/02/15   Baseline 75.10sec   Time 8   Period Weeks   Status Revised  Not met 04/01/15  62.25sec   OT LONG TERM GOAL #5   Title Pt will demo at least 25lbs L grip strength to assist in opening containers.--check 06/02/15   Baseline 0lbs   Time 8   Period Weeks   Status Revised  Not met 04/01/15  14lbs   OT LONG TERM GOAL #6   Title Pt will perform simple snack prep/meal prep with supervision.--check 06/02/15   Baseline not performing   Time 8   Period Weeks   Status On-going  04/03/15 not met.               Plan - 04/10/15 0946    Clinical Impression Statement Pt initiated visual scanning to the L today more and initiated using LUE without prompts (but with delay).  Pt progressing slowly toward goals.   Plan continue with strength, coordination, visual scanning and cognition for ADLs   Consulted and Agree with Plan of Care Patient        Problem List Patient Active Problem List   Diagnosis Date Noted  . New-onset angina ([place]) 01/14/2015  . Left homonymous hemianopsia 09/06/2014  . Occipital infarction ([place]) 09/05/2014  . AKI (acute kidney injury) ([place])   . Acute ischemic  right MCA stroke ([place]) 08/30/2014  . Acute right PCA stroke ([place]) 08/30/2014  . Abnormal chest x-ray 08/22/2014  . Type 2 diabetes mellitus with diabetic neuropathy ([place]) 01/04/2014  . HYPERCHOLESTEROLEMIA, MILD 05/22/2009  . PERS HX NONCOMPLIANCE W/MED TX PRS HAZARDS HLTH 07/11/2007  . Essential hypertension 05/04/2007  . Osteoarthritis 05/04/2007  . Fibromyalgia 05/04/2007    [facility] 04/10/2015, 11:18 AM  [facility] [address] [place], [place], [address] Phone: [phone]   Fax:  [phone]  Name: QUANTISHA MARSICANO MRN: [id number] Date of Birth: December 15, 1940  Vianne Bulls, OTR/L 04/10/2015 11:18 AM

## 2015-04-15 ENCOUNTER — Encounter: Payer: Self-pay | Admitting: Occupational Therapy

## 2015-04-15 ENCOUNTER — Ambulatory Visit: Payer: Medicare HMO | Admitting: Occupational Therapy

## 2015-04-15 VITALS — BP 146/95 | HR 79

## 2015-04-15 DIAGNOSIS — H53462 Homonymous bilateral field defects, left side: Secondary | ICD-10-CM

## 2015-04-15 DIAGNOSIS — IMO0002 Reserved for concepts with insufficient information to code with codable children: Secondary | ICD-10-CM

## 2015-04-15 DIAGNOSIS — H539 Unspecified visual disturbance: Secondary | ICD-10-CM

## 2015-04-15 DIAGNOSIS — R269 Unspecified abnormalities of gait and mobility: Secondary | ICD-10-CM | POA: Diagnosis not present

## 2015-04-15 NOTE — Therapy (Addendum)
[facility] [address] [place], [place], [address] Phone: [phone]   Fax:  [phone]  Occupational Therapy Treatment  Patient Details  Name: Kayla [name] MRN: [id number] Date of Birth: [birth year] No Data Recorded  Encounter Date: 04/15/2015      OT End of Session - 04/15/15 1546    Visit Number 13   Number of Visits 26   Date for OT Re-Evaluation 06/02/15   Authorization Type [employer] Medicare, no visit limit/auth, G-code needed   Authorization Time Period wk 2/8   Authorization - Visit Number 13   Authorization - Number of Visits 20   OT Start Time 1539   OT Stop Time 1617   OT Time Calculation (min) 38 min   Activity Tolerance Patient tolerated treatment well   Behavior During Therapy [facility] for tasks assessed/performed      Past Medical History  Diagnosis Date  . Asthmatic bronchitis   . Hypertension   . Venous insufficiency   . Hypercholesteremia   . Diverticulosis of colon   . Constipation   . Prolapse of vaginal vault after hysterectomy   . Proteinuria   . Fibromyalgia   . Somatic dysfunction   . Anxiety   . Personal history of noncompliance with medical treatment, presenting hazards to health   . Sickle-cell trait (HCC)   . Allergy     Shell fish  . GERD (gastroesophageal reflux disease)   . Heart murmur   . Coronary artery disease   . Type II diabetes mellitus (HCC)   . Blind left eye   . DJD (degenerative joint disease)   . Arthritis     "knees, back, probably left hand" (01/14/2015)  . History of gout   . Depression   . Stroke [facility]) ~ 08/2014    "blind in left eye; weak on left side since" (01/14/2015)    Past Surgical History  Procedure Laterality Date  . Abdominal hysterectomy    . Robotic assisted laparoscopic sacrocolpopexy  09/2009    Dr. Sherron Monday  . Cataract extraction w/ intraocular lens  implant, bilateral Bilateral 2012  . Tee without cardioversion N/A 09/02/2014     Procedure: TRANSESOPHAGEAL ECHOCARDIOGRAM (TEE);  Surgeon: Lewayne Bunting, MD;  Location: [facility] ENDOSCOPY;  Service: Cardiovascular;  Laterality: N/A;  . Loop recorder implant N/A 09/03/2014    Procedure: LOOP RECORDER IMPLANT;  Surgeon: Hillis Range, MD;  Location: [facility] CATH LAB;  Service: Cardiovascular;  Laterality: N/A;  . Cardiac catheterization N/A 01/14/2015    Procedure: Left Heart Cath and Coronary Angiography;  Surgeon: Rinaldo Cloud, MD;  Location: [facility] INVASIVE CV LAB;  Service: Cardiovascular;  Laterality: N/A;  . Cardiac catheterization  ~ 2011  . Dilation and curettage of uterus  "probably"  . Coronary angioplasty      Filed Vitals:   04/15/15 1551  BP: 146/95  Pulse: 79    Visit Diagnosis:  Visual disturbance  Left homonymous hemianopsia  Lack of coordination due to stroke      Subjective Assessment - 04/15/15 1731    Subjective  "I just feel tired/weak today"   Pertinent History CVA 08/19/14, hospitalization in 7/16 for cardiac procedure, see epic snapshot   Limitations hard of hearing, L homonymous hemianopsia   Patient Stated Goals Use LUE more   Currently in Pain? No/denies                      OT Treatments/Exercises (OP) - 04/15/15 0001  Visual/Perceptual Exercises   Copy this Image Other   Other Copying flower designs with pieces placed on L side to encourage tabletop visual scanning to the L.  Pt with occasional min cues, primarily for affirmation and orientation initially, but min-mod cues by end of activity (which may be due to attention).   Functional Reaching Activities   High Level to place/remove clothespins with 1-8lb resistance on vertical pole with LUE for coordination/strength and visual scanning (for specific colors)                OT Education - 04/15/15 1732    Education Details recommended pt perform light home maintenance tasks such as laundry, dishes, snack prep to incr activity level, visual scanning, LUE use    Person(s) Educated Patient   Methods Explanation   Comprehension Verbalized understanding          OT Short Term Goals - 04/03/15 0922    OT SHORT TERM GOAL #1   Title Pt will be independent with initial HEP.--check STGs 03/05/15   Time 4   Period Weeks   Status Achieved   OT SHORT TERM GOAL #2   Title Pt will perform tabletop visual scanning with at least 90% using compensation strategies prn without cues.   Baseline 75% with min cues   Time 4   Period Weeks   Status Achieved  04/03/15 approx 91-96%   OT SHORT TERM GOAL #3   Title Pt will improve coordination for ADLs as shown by improving time on 9-hole peg test by at least 10sec with LUE.   Baseline 75.10sec   Time 4   Period Weeks   Status Achieved  03/10/15 103.47sec; 04/01/15 62.25sec   OT SHORT TERM GOAL #4   Title Pt will demo at least 90% gross finger flex for grasping objects with LUE.   Baseline 75%   Time 4   Period Weeks   Status Achieved  approx this level   OT SHORT TERM GOAL #5   Title Pt will be able to complete fasteners mod I consistently.   Time 4   Period Weeks   Status Achieved  per pt report, does with difficulty   Additional Short Term Goals   Additional Short Term Goals Yes   OT SHORT TERM GOAL #6   Title Pt will perform simple environmental scanning with at least 50% accuracy for improved safety.--check 05/03/15   Time 4   Period Weeks   Status New   OT SHORT TERM GOAL #7   Title Pt will improve coordination for ADLs as shown by improving time on 9-hole peg test to less than 55sec with LUE.--check 05/03/15   Time 4   Period Weeks   Status New   OT SHORT TERM GOAL #8   Title Pt will demo at least 20lbs L grip strength to assist in opening containers.--check 05/03/15   Time 4   Period Weeks   Status New           OT Long Term Goals - 04/03/15 0924    OT LONG TERM GOAL #1   Title Pt will be independent with updated HEP.--check 06/02/15   Time 8   Period Weeks   Status On-going   04/01/15:  initial HEP remains appropriate    OT LONG TERM GOAL #2   Title Pt will verbalize understanding of cognitive compensation strategies for ADLs prn.--check 06/02/15   Time 8   Period Weeks   Status On-going  04/03/15  not  met/not fully addressed   OT LONG TERM GOAL #3   Title Pt will perform simple environmental scanning with at least 75% accuracy for improved safety.--check 06/02/15   Time 8   Period Weeks   Status Revised  04/03/15 approx 30%    OT LONG TERM GOAL #4   Title Pt will improve coordination for ADLs as shown by improving time on 9-hole peg test to less than 50sec with LUE.--check 06/02/15   Baseline 75.10sec   Time 8   Period Weeks   Status Revised  Not met 04/01/15  62.25sec   OT LONG TERM GOAL #5   Title Pt will demo at least 25lbs L grip strength to assist in opening containers.--check 06/02/15   Baseline 0lbs   Time 8   Period Weeks   Status Revised  Not met 04/01/15  14lbs   OT LONG TERM GOAL #6   Title Pt will perform simple snack prep/meal prep with supervision.--check 06/02/15   Baseline not performing   Time 8   Period Weeks   Status On-going  04/03/15 not met.               Plan - 04/15/15 1728    Clinical Impression Statement Pt demo improved visual scanning initially, but then needed incr cues (?due to attention).  Pt progressing towards goals.   Plan light home maintenance/kitchen task   Consulted and Agree with Plan of Care Patient        Problem List Patient Active Problem List   Diagnosis Date Noted  . New-onset angina ([place]) 01/14/2015  . Left homonymous hemianopsia 09/06/2014  . Occipital infarction ([place]) 09/05/2014  . AKI (acute kidney injury) ([place])   . Acute ischemic right MCA stroke ([place]) 08/30/2014  . Acute right PCA stroke ([place]) 08/30/2014  . Abnormal chest x-ray 08/22/2014  . Type 2 diabetes mellitus with diabetic neuropathy ([place]) 01/04/2014  . HYPERCHOLESTEROLEMIA, MILD 05/22/2009  . PERS HX  NONCOMPLIANCE W/MED TX PRS HAZARDS HLTH 07/11/2007  . Essential hypertension 05/04/2007  . Osteoarthritis 05/04/2007  . Fibromyalgia 05/04/2007    [facility] 04/15/2015, 5:33 PM  [place] [address] [place], [place], [address] Phone: [phone]   Fax:  [phone]  Name: ZIAIRE BIESER MRN: [id number] Date of Birth: 02-Aug-1940  Vianne Bulls, OTR/L 04/15/2015 5:33 PM

## 2015-04-17 ENCOUNTER — Ambulatory Visit: Payer: Medicare HMO | Admitting: Occupational Therapy

## 2015-04-17 DIAGNOSIS — R6889 Other general symptoms and signs: Secondary | ICD-10-CM

## 2015-04-17 DIAGNOSIS — H539 Unspecified visual disturbance: Secondary | ICD-10-CM

## 2015-04-17 DIAGNOSIS — I69319 Unspecified symptoms and signs involving cognitive functions following cerebral infarction: Secondary | ICD-10-CM

## 2015-04-17 DIAGNOSIS — IMO0002 Reserved for concepts with insufficient information to code with codable children: Secondary | ICD-10-CM

## 2015-04-17 DIAGNOSIS — H53462 Homonymous bilateral field defects, left side: Secondary | ICD-10-CM

## 2015-04-17 DIAGNOSIS — R269 Unspecified abnormalities of gait and mobility: Secondary | ICD-10-CM | POA: Diagnosis not present

## 2015-04-17 DIAGNOSIS — R2681 Unsteadiness on feet: Secondary | ICD-10-CM

## 2015-04-17 NOTE — Therapy (Signed)
[facility] [address] [place], [place], [address] Phone: [phone]   Fax:  [phone]  Occupational Therapy Treatment  Patient Details  Name: Kayla [name] MRN: [id number] Date of Birth: 14-May-1941 No Data Recorded  Encounter Date: 04/17/2015      OT End of Session - 04/17/15 1556    Visit Number 14   Number of Visits 26   Date for OT Re-Evaluation 06/02/15   Authorization Type [employer] Medicare, no visit limit/auth, G-code needed   Authorization Time Period wk 2/8   Authorization - Visit Number 36   Authorization - Number of Visits 20   OT Start Time 1535   OT Stop Time 1615   OT Time Calculation (min) 40 min   Activity Tolerance Patient tolerated treatment well   Behavior During Therapy [facility] for tasks assessed/performed      Past Medical History  Diagnosis Date  . Asthmatic bronchitis   . Hypertension   . Venous insufficiency   . Hypercholesteremia   . Diverticulosis of colon   . Constipation   . Prolapse of vaginal vault after hysterectomy   . Proteinuria   . Fibromyalgia   . Somatic dysfunction   . Anxiety   . Personal history of noncompliance with medical treatment, presenting hazards to health   . Sickle-cell trait ([place])   . Allergy     Shell fish  . GERD (gastroesophageal reflux disease)   . Heart murmur   . Coronary artery disease   . Type II diabetes mellitus ([place])   . Blind left eye   . DJD (degenerative joint disease)   . Arthritis     "knees, back, probably left hand" (01/14/2015)  . History of gout   . Depression   . Stroke [facility]) ~ 08/2014    "blind in left eye; weak on left side since" (01/14/2015)    Past Surgical History  Procedure Laterality Date  . Abdominal hysterectomy    . Robotic assisted laparoscopic sacrocolpopexy  09/2009    Dr. Matilde Sprang  . Cataract extraction w/ intraocular lens  implant, bilateral Bilateral 2012  . Tee without cardioversion N/A 09/02/2014     Procedure: TRANSESOPHAGEAL ECHOCARDIOGRAM (TEE);  Surgeon: Lelon Perla, MD;  Location: [facility] ENDOSCOPY;  Service: Cardiovascular;  Laterality: N/A;  . Loop recorder implant N/A 09/03/2014    Procedure: LOOP RECORDER IMPLANT;  Surgeon: Thompson Grayer, MD;  Location: [facility] CATH LAB;  Service: Cardiovascular;  Laterality: N/A;  . Cardiac catheterization N/A 01/14/2015    Procedure: Left Heart Cath and Coronary Angiography;  Surgeon: Charolette Forward, MD;  Location: [facility] CV LAB;  Service: Cardiovascular;  Laterality: N/A;  . Cardiac catheterization  ~ 2011  . Dilation and curettage of uterus  "probably"  . Coronary angioplasty      There were no vitals filed for this visit.  Visit Diagnosis:  Cognitive deficits following cerebral infarction  Left homonymous hemianopsia  Visual disturbance  Lack of coordination due to stroke  Unsteadiness  Decreased activity tolerance      Subjective Assessment - 04/17/15 1537    Subjective  "I'm doing ok"   Patient is accompained by: Family member  son   Pertinent History CVA 08/19/14, hospitalization in 7/16 for cardiac procedure, see epic snapshot   Limitations hard of hearing, L homonymous hemianopsia   Patient Stated Goals Use LUE more   Currently in Pain? No/denies   Pain Score 3    Pain Location --  leg and low  back discomfort   Pain Descriptors / Indicators Aching   Pain Type Chronic pain   Aggravating Factors  standing   Pain Relieving Factors tylenol                      OT Treatments/Exercises (OP) - 04/17/15 0001    ADLs   Cooking Pt gathered needed items (with min v.c. for visual scanning) and prepared grilled cheese sandwich with supervision and min v.c. to use LUE to stabilize pan when flipping sandwich and for orientating sandwich correctly (questioning cue to put butter on the outside).  Pt needed incr time for task.   Home Maintenance Placing laundry in washer and then removed and folded for incr activity  tolerance and incr IADL participation.  Pt needed to rest due to low back discomfort after folding.   Visual/Perceptual Exercises   Scanning - Tabletop to locate card matches with incr time and min cueing for directions initially, but then no cues.  Pt initiated looking to the L side of the table initially without cueing.                OT Education - 04/17/15 1715    Education Details Instructed/recommended safe ways for pt to incr participation in simple cooking and light home maintenance tasks with supervision; instructed pt/son in having reasonable expectations, purpose of incr activity, and cueing/allowing incr time as well as safety concerns;  Reviewed progress with pt/son   Person(s) Educated Patient;Child(ren)   Methods Explanation   Comprehension Verbalized understanding          OT Short Term Goals - 04/03/15 0922    OT SHORT TERM GOAL #1   Title Pt will be independent with initial HEP.--check STGs 03/05/15   Time 4   Period Weeks   Status Achieved   OT SHORT TERM GOAL #2   Title Pt will perform tabletop visual scanning with at least 90% using compensation strategies prn without cues.   Baseline 75% with min cues   Time 4   Period Weeks   Status Achieved  04/03/15 approx 91-96%   OT SHORT TERM GOAL #3   Title Pt will improve coordination for ADLs as shown by improving time on 9-hole peg test by at least 10sec with LUE.   Baseline 75.10sec   Time 4   Period Weeks   Status Achieved  03/10/15 103.47sec; 04/01/15 62.25sec   OT SHORT TERM GOAL #4   Title Pt will demo at least 90% gross finger flex for grasping objects with LUE.   Baseline 75%   Time 4   Period Weeks   Status Achieved  approx this level   OT SHORT TERM GOAL #5   Title Pt will be able to complete fasteners mod I consistently.   Time 4   Period Weeks   Status Achieved  per pt report, does with difficulty   Additional Short Term Goals   Additional Short Term Goals Yes   OT SHORT TERM GOAL #6    Title Pt will perform simple environmental scanning with at least 50% accuracy for improved safety.--check 05/03/15   Time 4   Period Weeks   Status New   OT SHORT TERM GOAL #7   Title Pt will improve coordination for ADLs as shown by improving time on 9-hole peg test to less than 55sec with LUE.--check 05/03/15   Time 4   Period Weeks   Status New   OT SHORT TERM GOAL #8  Title Pt will demo at least 20lbs L grip strength to assist in opening containers.--check 05/03/15   Time 4   Period Weeks   Status New           OT Long Term Goals - 04/17/15 1703    OT LONG TERM GOAL #1   Title Pt will be independent with updated HEP.--check 06/02/15   Time 8   Period Weeks   Status On-going  04/01/15:  initial HEP remains appropriate    OT LONG TERM GOAL #2   Title Pt will verbalize understanding of cognitive compensation strategies for ADLs prn.--check 06/02/15   Time 8   Period Weeks   Status Achieved  04/03/15  not met/not fully addressed; 04/17/15:  met   OT LONG TERM GOAL #3   Title Pt will perform simple environmental scanning with at least 75% accuracy for improved safety.--check 06/02/15   Time 8   Period Weeks   Status Revised  04/03/15 approx 30%    OT LONG TERM GOAL #4   Title Pt will improve coordination for ADLs as shown by improving time on 9-hole peg test to less than 50sec with LUE.--check 06/02/15   Baseline 75.10sec   Time 8   Period Weeks   Status Revised  Not met 04/01/15  62.25sec   OT LONG TERM GOAL #5   Title Pt will demo at least 25lbs L grip strength to assist in opening containers.--check 06/02/15   Baseline 0lbs   Time 8   Period Weeks   Status Revised  Not met 04/01/15  14lbs   OT LONG TERM GOAL #6   Title Pt will perform simple snack prep/meal prep with supervision.--check 06/02/15   Baseline not performing   Time 8   Period Weeks   Status On-going  04/03/15 not met.               Plan - 04/17/15 1638    Clinical Impression  Statement Pt was able to perform simple cooking and home maintenance task with min cues.     Plan continue with IADLs, LUE strength/coordination, LUE functional use, visual scanning   Consulted and Agree with Plan of Care Patient        Problem List Patient Active Problem List   Diagnosis Date Noted  . New-onset angina ([place]) 01/14/2015  . Left homonymous hemianopsia 09/06/2014  . Occipital infarction ([place]) 09/05/2014  . AKI (acute kidney injury) ([place])   . Acute ischemic right MCA stroke ([place]) 08/30/2014  . Acute right PCA stroke ([place]) 08/30/2014  . Abnormal chest x-ray 08/22/2014  . Type 2 diabetes mellitus with diabetic neuropathy ([place]) 01/04/2014  . HYPERCHOLESTEROLEMIA, MILD 05/22/2009  . PERS HX NONCOMPLIANCE W/MED TX PRS HAZARDS HLTH 07/11/2007  . Essential hypertension 05/04/2007  . Osteoarthritis 05/04/2007  . Fibromyalgia 05/04/2007    [facility] 04/17/2015, 5:19 PM  [place] [address] [place], [place], [address] Phone: [phone]   Fax:  [phone]  Name: Kayla [name] MRN: [id number] Date of Birth: [birth year]  Vianne Bulls, OTR/L 04/17/2015 5:20 PM

## 2015-04-21 LAB — CUP PACEART REMOTE DEVICE CHECK: MDC IDC SESS DTM: 20161011150645

## 2015-04-22 ENCOUNTER — Ambulatory Visit: Payer: Medicare HMO | Attending: Physical Medicine & Rehabilitation | Admitting: Occupational Therapy

## 2015-04-22 DIAGNOSIS — R279 Unspecified lack of coordination: Secondary | ICD-10-CM | POA: Diagnosis not present

## 2015-04-22 DIAGNOSIS — R2681 Unsteadiness on feet: Secondary | ICD-10-CM | POA: Insufficient documentation

## 2015-04-22 DIAGNOSIS — I6980 Unspecified sequelae of other cerebrovascular disease: Secondary | ICD-10-CM | POA: Insufficient documentation

## 2015-04-22 DIAGNOSIS — R6889 Other general symptoms and signs: Secondary | ICD-10-CM | POA: Insufficient documentation

## 2015-04-22 DIAGNOSIS — H53462 Homonymous bilateral field defects, left side: Secondary | ICD-10-CM

## 2015-04-22 DIAGNOSIS — I69319 Unspecified symptoms and signs involving cognitive functions following cerebral infarction: Secondary | ICD-10-CM | POA: Diagnosis not present

## 2015-04-22 DIAGNOSIS — M25642 Stiffness of left hand, not elsewhere classified: Secondary | ICD-10-CM | POA: Diagnosis not present

## 2015-04-22 DIAGNOSIS — H539 Unspecified visual disturbance: Secondary | ICD-10-CM | POA: Diagnosis not present

## 2015-04-22 DIAGNOSIS — G8194 Hemiplegia, unspecified affecting left nondominant side: Secondary | ICD-10-CM | POA: Insufficient documentation

## 2015-04-22 DIAGNOSIS — IMO0002 Reserved for concepts with insufficient information to code with codable children: Secondary | ICD-10-CM

## 2015-04-22 NOTE — Therapy (Signed)
[facility] [address] [place], [place], [address] Phone: [phone]   Fax:  [phone]  Occupational Therapy Treatment  Patient Details  Name: Kayla [name] MRN: [id number] Date of Birth: 1941/02/05 No Data Recorded  Encounter Date: 04/22/2015      OT End of Session - 04/22/15 1546    Visit Number 15   Number of Visits 26   Date for OT Re-Evaluation 06/02/15   Authorization Type [employer] Medicare, no visit limit/auth, G-code needed   Authorization Time Period wk 3/8   Authorization - Visit Number 15   Authorization - Number of Visits 20   OT Start Time 1540   OT Stop Time 1620   OT Time Calculation (min) 40 min   Activity Tolerance Patient tolerated treatment well   Behavior During Therapy [facility] for tasks assessed/performed      Past Medical History  Diagnosis Date  . Asthmatic bronchitis   . Hypertension   . Venous insufficiency   . Hypercholesteremia   . Diverticulosis of colon   . Constipation   . Prolapse of vaginal vault after hysterectomy   . Proteinuria   . Fibromyalgia   . Somatic dysfunction   . Anxiety   . Personal history of noncompliance with medical treatment, presenting hazards to health   . Sickle-cell trait ([place])   . Allergy     Shell fish  . GERD (gastroesophageal reflux disease)   . Heart murmur   . Coronary artery disease   . Type II diabetes mellitus ([place])   . Blind left eye   . DJD (degenerative joint disease)   . Arthritis     "knees, back, probably left hand" (01/14/2015)  . History of gout   . Depression   . Stroke [facility]) ~ 08/2014    "blind in left eye; weak on left side since" (01/14/2015)    Past Surgical History  Procedure Laterality Date  . Abdominal hysterectomy    . Robotic assisted laparoscopic sacrocolpopexy  09/2009    Dr. Matilde Sprang  . Cataract extraction w/ intraocular lens  implant, bilateral Bilateral 2012  . Tee without cardioversion N/A 09/02/2014   Procedure: TRANSESOPHAGEAL ECHOCARDIOGRAM (TEE);  Surgeon: Lelon Perla, MD;  Location: [facility] ENDOSCOPY;  Service: Cardiovascular;  Laterality: N/A;  . Loop recorder implant N/A 09/03/2014    Procedure: LOOP RECORDER IMPLANT;  Surgeon: Thompson Grayer, MD;  Location: [facility] CATH LAB;  Service: Cardiovascular;  Laterality: N/A;  . Cardiac catheterization N/A 01/14/2015    Procedure: Left Heart Cath and Coronary Angiography;  Surgeon: Charolette Forward, MD;  Location: [place] CV LAB;  Service: Cardiovascular;  Laterality: N/A;  . Cardiac catheterization  ~ 2011  . Dilation and curettage of uterus  "probably"  . Coronary angioplasty      There were no vitals filed for this visit.  Visit Diagnosis:  Left homonymous hemianopsia  Visual disturbance  Cognitive deficits following cerebral infarction  Lack of coordination due to stroke  Stiffness of left hand joint      Subjective Assessment - 04/22/15 1548    Subjective  Pt reports that she cleaned greens using her L hand   Pertinent History CVA 08/19/14, hospitalization in 7/16 for cardiac procedure, see epic snapshot   Limitations hard of hearing, L homonymous hemianopsia   Patient Stated Goals Use LUE more   Currently in Pain? No/denies  OT Treatments/Exercises (OP) - 04/22/15 0001    Visual/Perceptual Exercises   Pegboard Functional reaching to place medium pegs in vertical pegboard with LUE to copy design.  Pt performed with mod cues for copying L side of design (occasional min v.c. for R side) with incr time and min difficulty for coordination.   Scanning - Environmental with approx 35% accuracy with 1-2 v.c.  Then with mod-max v.c. with approx 90% accuracy and min A for balance (pt forgot cane today)   Scanning - Tabletop from L>R side to copy phone numbers with 95% accuracy (only skipped 1 phone number).                OT Education - 04/22/15 1641    Education Details Recommended pt/family  discuss d/c vs. continuing next week   Person(s) Educated Patient;Spouse   Methods Explanation   Comprehension Verbalized understanding          OT Short Term Goals - 04/03/15 0922    OT SHORT TERM GOAL #1   Title Pt will be independent with initial HEP.--check STGs 03/05/15   Time 4   Period Weeks   Status Achieved   OT SHORT TERM GOAL #2   Title Pt will perform tabletop visual scanning with at least 90% using compensation strategies prn without cues.   Baseline 75% with min cues   Time 4   Period Weeks   Status Achieved  04/03/15 approx 91-96%   OT SHORT TERM GOAL #3   Title Pt will improve coordination for ADLs as shown by improving time on 9-hole peg test by at least 10sec with LUE.   Baseline 75.10sec   Time 4   Period Weeks   Status Achieved  03/10/15 103.47sec; 04/01/15 62.25sec   OT SHORT TERM GOAL #4   Title Pt will demo at least 90% gross finger flex for grasping objects with LUE.   Baseline 75%   Time 4   Period Weeks   Status Achieved  approx this level   OT SHORT TERM GOAL #5   Title Pt will be able to complete fasteners mod I consistently.   Time 4   Period Weeks   Status Achieved  per pt report, does with difficulty   Additional Short Term Goals   Additional Short Term Goals Yes   OT SHORT TERM GOAL #6   Title Pt will perform simple environmental scanning with at least 50% accuracy for improved safety.--check 05/03/15   Time 4   Period Weeks   Status New   OT SHORT TERM GOAL #7   Title Pt will improve coordination for ADLs as shown by improving time on 9-hole peg test to less than 55sec with LUE.--check 05/03/15   Time 4   Period Weeks   Status New   OT SHORT TERM GOAL #8   Title Pt will demo at least 20lbs L grip strength to assist in opening containers.--check 05/03/15   Time 4   Period Weeks   Status New           OT Long Term Goals - 04/17/15 1703    OT LONG TERM GOAL #1   Title Pt will be independent with updated HEP.--check  06/02/15   Time 8   Period Weeks   Status On-going  04/01/15:  initial HEP remains appropriate    OT LONG TERM GOAL #2   Title Pt will verbalize understanding of cognitive compensation strategies for ADLs prn.--check 06/02/15   Time 8  Period Weeks   Status Achieved  04/03/15  not met/not fully addressed; 04/17/15:  met   OT LONG TERM GOAL #3   Title Pt will perform simple environmental scanning with at least 75% accuracy for improved safety.--check 06/02/15   Time 8   Period Weeks   Status Revised  04/03/15 approx 30%    OT LONG TERM GOAL #4   Title Pt will improve coordination for ADLs as shown by improving time on 9-hole peg test to less than 50sec with LUE.--check 06/02/15   Baseline 75.10sec   Time 8   Period Weeks   Status Revised  Not met 04/01/15  62.25sec   OT LONG TERM GOAL #5   Title Pt will demo at least 25lbs L grip strength to assist in opening containers.--check 06/02/15   Baseline 0lbs   Time 8   Period Weeks   Status Revised  Not met 04/01/15  14lbs   OT LONG TERM GOAL #6   Title Pt will perform simple snack prep/meal prep with supervision.--check 06/02/15   Baseline not performing   Time 8   Period Weeks   Status On-going  04/03/15 not met.               Plan - 04/22/15 1653    Clinical Impression Statement Pt progressing slowly towards goals, but continues to demo significant difficulty with environmental scanning.   Plan environmental scanning, IADLs, begin checking STGs   Consulted and Agree with Plan of Care Patient        Problem List Patient Active Problem List   Diagnosis Date Noted  . New-onset angina ([place]) 01/14/2015  . Left homonymous hemianopsia 09/06/2014  . Occipital infarction ([place]) 09/05/2014  . AKI (acute kidney injury) ([place])   . Acute ischemic right MCA stroke ([place]) 08/30/2014  . Acute right PCA stroke ([place]) 08/30/2014  . Abnormal chest x-ray 08/22/2014  . Type 2 diabetes mellitus with diabetic neuropathy ([place])  01/04/2014  . HYPERCHOLESTEROLEMIA, MILD 05/22/2009  . PERS HX NONCOMPLIANCE W/MED TX PRS HAZARDS HLTH 07/11/2007  . Essential hypertension 05/04/2007  . Osteoarthritis 05/04/2007  . Fibromyalgia 05/04/2007    [facility] 04/22/2015, 4:55 PM  [facility] [address] [place], [place], [address] Phone: [phone]   Fax:  [phone]  Name: TORII ROYSE MRN: [id number] Date of Birth: June 12, 1941  Vianne Bulls, OTR/L 04/22/2015 4:55 PM

## 2015-04-23 DIAGNOSIS — I2510 Atherosclerotic heart disease of native coronary artery without angina pectoris: Secondary | ICD-10-CM | POA: Diagnosis not present

## 2015-04-23 DIAGNOSIS — E119 Type 2 diabetes mellitus without complications: Secondary | ICD-10-CM | POA: Diagnosis not present

## 2015-04-23 DIAGNOSIS — I639 Cerebral infarction, unspecified: Secondary | ICD-10-CM | POA: Diagnosis not present

## 2015-04-23 DIAGNOSIS — I209 Angina pectoris, unspecified: Secondary | ICD-10-CM | POA: Diagnosis not present

## 2015-04-23 DIAGNOSIS — E785 Hyperlipidemia, unspecified: Secondary | ICD-10-CM | POA: Diagnosis not present

## 2015-04-23 DIAGNOSIS — I10 Essential (primary) hypertension: Secondary | ICD-10-CM | POA: Diagnosis not present

## 2015-04-24 ENCOUNTER — Ambulatory Visit: Payer: Medicare HMO | Admitting: Occupational Therapy

## 2015-04-24 DIAGNOSIS — I69319 Unspecified symptoms and signs involving cognitive functions following cerebral infarction: Secondary | ICD-10-CM

## 2015-04-24 DIAGNOSIS — H53462 Homonymous bilateral field defects, left side: Secondary | ICD-10-CM

## 2015-04-24 DIAGNOSIS — R2681 Unsteadiness on feet: Secondary | ICD-10-CM

## 2015-04-24 DIAGNOSIS — G8194 Hemiplegia, unspecified affecting left nondominant side: Secondary | ICD-10-CM

## 2015-04-24 DIAGNOSIS — IMO0002 Reserved for concepts with insufficient information to code with codable children: Secondary | ICD-10-CM

## 2015-04-24 DIAGNOSIS — H539 Unspecified visual disturbance: Secondary | ICD-10-CM

## 2015-04-24 DIAGNOSIS — R6889 Other general symptoms and signs: Secondary | ICD-10-CM

## 2015-04-24 DIAGNOSIS — M25642 Stiffness of left hand, not elsewhere classified: Secondary | ICD-10-CM

## 2015-04-24 NOTE — Therapy (Signed)
[facility] [address] [place], [place], [address] Phone: [phone]   Fax:  [phone]  Occupational Therapy Treatment  Patient Details  Name: Kayla [name] MRN: [id number] Date of Birth: 1941-05-19 No Data Recorded  Encounter Date: 04/24/2015      OT End of Session - 04/24/15 1627    Visit Number 16   Number of Visits 26   Date for OT Re-Evaluation 06/02/15   Authorization Type [employer] Medicare, no visit limit/auth, G-code needed   Authorization Time Period wk 3/8   Authorization - Visit Number 16   Authorization - Number of Visits 20   OT Start Time 6811   OT Stop Time 1617   OT Time Calculation (min) 45 min   Activity Tolerance Patient tolerated treatment well   Behavior During Therapy [facility] for tasks assessed/performed      Past Medical History  Diagnosis Date  . Asthmatic bronchitis   . Hypertension   . Venous insufficiency   . Hypercholesteremia   . Diverticulosis of colon   . Constipation   . Prolapse of vaginal vault after hysterectomy   . Proteinuria   . Fibromyalgia   . Somatic dysfunction   . Anxiety   . Personal history of noncompliance with medical treatment, presenting hazards to health   . Sickle-cell trait ([place])   . Allergy     Shell fish  . GERD (gastroesophageal reflux disease)   . Heart murmur   . Coronary artery disease   . Type II diabetes mellitus ([place])   . Blind left eye   . DJD (degenerative joint disease)   . Arthritis     "knees, back, probably left hand" (01/14/2015)  . History of gout   . Depression   . Stroke [facility]) ~ 08/2014    "blind in left eye; weak on left side since" (01/14/2015)    Past Surgical History  Procedure Laterality Date  . Abdominal hysterectomy    . Robotic assisted laparoscopic sacrocolpopexy  09/2009    Dr. Matilde Sprang  . Cataract extraction w/ intraocular lens  implant, bilateral Bilateral 2012  . Tee without cardioversion N/A 09/02/2014   Procedure: TRANSESOPHAGEAL ECHOCARDIOGRAM (TEE);  Surgeon: Lelon Perla, MD;  Location: [facility] ENDOSCOPY;  Service: Cardiovascular;  Laterality: N/A;  . Loop recorder implant N/A 09/03/2014    Procedure: LOOP RECORDER IMPLANT;  Surgeon: Thompson Grayer, MD;  Location: [facility] CATH LAB;  Service: Cardiovascular;  Laterality: N/A;  . Cardiac catheterization N/A 01/14/2015    Procedure: Left Heart Cath and Coronary Angiography;  Surgeon: Charolette Forward, MD;  Location: [place] CV LAB;  Service: Cardiovascular;  Laterality: N/A;  . Cardiac catheterization  ~ 2011  . Dilation and curettage of uterus  "probably"  . Coronary angioplasty      There were no vitals filed for this visit.  Visit Diagnosis:  Visual disturbance  Cognitive deficits following cerebral infarction  Left homonymous hemianopsia  Lack of coordination due to stroke  Stiffness of left hand joint  Unsteadiness  Left hemiparesis (HCC)  Decreased activity tolerance      Subjective Assessment - 04/24/15 1534    Subjective  Pt/son reports that they feel comfortable discharging next week   Patient is accompained by: Family member  son   Pertinent History CVA 08/19/14, hospitalization in 7/16 for cardiac procedure, see epic snapshot   Limitations hard of hearing, L homonymous hemianopsia   Patient Stated Goals Use LUE more   Currently in Pain? Yes  Pain Score 6    Pain Location Leg   Pain Orientation Right   Pain Descriptors / Indicators Aching   Pain Frequency Intermittent   Aggravating Factors  standing   Pain Relieving Factors tylenol                      OT Treatments/Exercises (OP) - 04/24/15 0001    ADLs   ADL Comments began checking goals and discussing progress with pt/son and activities to work on and safety considerations to continue in prep for d/c next week   Visual/Perceptual Exercises   Other Copying block designs with min cues and occasional min v.c. for LUE use.    Scanning -  Environmental with approx 77% accuracyx3, min-mod cues for 100% accuracy   Functional Reaching Activities   High Level functional reaching to place clothespins with 1-8lb resistance on vertical pole with LUE for coordination/strength and visual scanning (for specific colors), min-mod difficulty with 8lb resistance clothespins                OT Education - 04/24/15 1547    Education Details Upgraded putty HEP to red; reviewed activities to do at home with supervision to incr IADL performance, for visual scanning, and attention/cognition with pt/son and how to cue pt (son verbalized understanding); safety concerns for activity (supervision and will have incr difficulty in busy, unfamiliar environments)   Person(s) Educated Patient;Child(ren)   Methods Explanation;Demonstration;Verbal cues   Comprehension Verbalized understanding;Returned demonstration          OT Short Term Goals - 04/24/15 1544    OT SHORT TERM GOAL #1   Title Pt will be independent with initial HEP.--check STGs 03/05/15   Time 4   Period Weeks   Status Achieved   OT SHORT TERM GOAL #2   Title Pt will perform tabletop visual scanning with at least 90% using compensation strategies prn without cues.   Baseline 75% with min cues   Time 4   Period Weeks   Status Achieved  04/03/15 approx 91-96%   OT SHORT TERM GOAL #3   Title Pt will improve coordination for ADLs as shown by improving time on 9-hole peg test by at least 10sec with LUE.   Baseline 75.10sec   Time 4   Period Weeks   Status Achieved  03/10/15 103.47sec; 04/01/15 62.25sec   OT SHORT TERM GOAL #4   Title Pt will demo at least 90% gross finger flex for grasping objects with LUE.   Baseline 75%   Time 4   Period Weeks   Status Achieved  approx this level   OT SHORT TERM GOAL #5   Title Pt will be able to complete fasteners mod I consistently.   Time 4   Period Weeks   Status Achieved  per pt report, does with difficulty   OT SHORT TERM  GOAL #6   Title Pt will perform simple environmental scanning with at least 50% accuracy for improved safety.--check 05/03/15   Time 4   Period Weeks   Status Achieved  04/24/15   77%    OT SHORT TERM GOAL #7   Title Pt will improve coordination for ADLs as shown by improving time on 9-hole peg test to less than 55sec with LUE.--check 05/03/15   Time 4   Period Weeks   Status New   OT SHORT TERM GOAL #8   Title Pt will demo at least 20lbs L grip strength to assist in opening containers.--check  05/03/15   Time 4   Period Weeks   Status On-going  04/24/15:  16lbs           OT Long Term Goals - 04/24/15 1647    OT LONG TERM GOAL #1   Title Pt will be independent with updated HEP.--check 06/02/15   Time 8   Period Weeks   Status Achieved  04/01/15:  initial HEP remains appropriate; 04/24/15 updated putty HEP and review cognitive/visual activities with pt/son   OT LONG TERM GOAL #2   Title Pt will verbalize understanding of cognitive compensation strategies for ADLs prn.--check 06/02/15   Time 8   Period Weeks   Status Achieved  04/03/15  not met/not fully addressed; 04/17/15:  met   OT LONG TERM GOAL #3   Title Pt will perform simple environmental scanning with at least 75% accuracy for improved safety.--check 06/02/15   Time 8   Period Weeks   Status Revised  04/03/15 approx 30%    OT LONG TERM GOAL #4   Title Pt will improve coordination for ADLs as shown by improving time on 9-hole peg test to less than 50sec with LUE.--check 06/02/15   Baseline 75.10sec   Time 8   Period Weeks   Status Revised  Not met 04/01/15  62.25sec   OT LONG TERM GOAL #5   Title Pt will demo at least 25lbs L grip strength to assist in opening containers.--check 06/02/15   Baseline 0lbs   Time 8   Period Weeks   Status Revised  Not met 04/01/15  14lbs   OT LONG TERM GOAL #6   Title Pt will perform simple snack prep/meal prep with supervision.--check 06/02/15   Baseline not performing    Time 8   Period Weeks   Status On-going  04/03/15 not met.               Plan - 04/24/15 1635    Clinical Impression Statement Pt demo improved environmental scanning today (for incr safety) and improved ability to copy design visually.  Began prep for d/c next week.   Plan check goals and d/c next week   Consulted and Agree with Plan of Care Patient   Family Member Consulted son        Problem List Patient Active Problem List   Diagnosis Date Noted  . New-onset angina ([place]) 01/14/2015  . Left homonymous hemianopsia 09/06/2014  . Occipital infarction ([place]) 09/05/2014  . AKI (acute kidney injury) ([place])   . Acute ischemic right MCA stroke ([place]) 08/30/2014  . Acute right PCA stroke ([place]) 08/30/2014  . Abnormal chest x-ray 08/22/2014  . Type 2 diabetes mellitus with diabetic neuropathy ([name]) 01/04/2014  . HYPERCHOLESTEROLEMIA, MILD 05/22/2009  . PERS HX NONCOMPLIANCE W/MED TX PRS HAZARDS HLTH 07/11/2007  . Essential hypertension 05/04/2007  . Osteoarthritis 05/04/2007  . Fibromyalgia 05/04/2007    [facility] 04/24/2015, 4:58 PM  [facility] [facility] [address] [place], [place], [address] Phone: [phone]   Fax:  [phone]  Name: KALYNA PAOLELLA MRN: [id number] Date of Birth: 05-Oct-1940  Vianne Bulls, OTR/L 04/24/2015 4:59 PM

## 2015-04-29 ENCOUNTER — Ambulatory Visit: Payer: Medicare HMO | Admitting: Occupational Therapy

## 2015-04-29 ENCOUNTER — Encounter: Payer: Self-pay | Admitting: Occupational Therapy

## 2015-04-29 DIAGNOSIS — H53462 Homonymous bilateral field defects, left side: Secondary | ICD-10-CM

## 2015-04-29 DIAGNOSIS — H539 Unspecified visual disturbance: Secondary | ICD-10-CM

## 2015-04-29 DIAGNOSIS — IMO0002 Reserved for concepts with insufficient information to code with codable children: Secondary | ICD-10-CM

## 2015-04-29 DIAGNOSIS — I69319 Unspecified symptoms and signs involving cognitive functions following cerebral infarction: Secondary | ICD-10-CM

## 2015-04-29 NOTE — Therapy (Signed)
[facility] [address] [place], [place], [address] Phone: [phone]   Fax:  [phone]  Occupational Therapy Treatment  Patient Details  Name: Kayla [name] MRN: [id number] Date of Birth: September 02, 1940 No Data Recorded  Encounter Date: 04/29/2015      OT End of Session - 04/29/15 1243    Visit Number 17   Number of Visits 26   Date for OT Re-Evaluation 06/02/15   Authorization Type A[employer] Medicare, no visit limit/auth, G-code needed   Authorization Time Period wk 4/8   Authorization - Visit Number 52   Authorization - Number of Visits 20   OT Start Time 1150   OT Stop Time 1230   OT Time Calculation (min) 40 min   Activity Tolerance Patient tolerated treatment well   Behavior During Therapy [facility] for tasks assessed/performed      Past Medical History  Diagnosis Date  . Asthmatic bronchitis   . Hypertension   . Venous insufficiency   . Hypercholesteremia   . Diverticulosis of colon   . Constipation   . Prolapse of vaginal vault after hysterectomy   . Proteinuria   . Fibromyalgia   . Somatic dysfunction   . Anxiety   . Personal history of noncompliance with medical treatment, presenting hazards to health   . Sickle-cell trait ([place])   . Allergy     Shell fish  . GERD (gastroesophageal reflux disease)   . Heart murmur   . Coronary artery disease   . Type II diabetes mellitus ([place])   . Blind left eye   . DJD (degenerative joint disease)   . Arthritis     "knees, back, probably left hand" (01/14/2015)  . History of gout   . Depression   . Stroke [facility]) ~ 08/2014    "blind in left eye; weak on left side since" (01/14/2015)    Past Surgical History  Procedure Laterality Date  . Abdominal hysterectomy    . Robotic assisted laparoscopic sacrocolpopexy  09/2009    Dr. Matilde Sprang  . Cataract extraction w/ intraocular lens  implant, bilateral Bilateral 2012  . Tee without cardioversion N/A 09/02/2014   Procedure: TRANSESOPHAGEAL ECHOCARDIOGRAM (TEE);  Surgeon: Lelon Perla, MD;  Location: [facility] ENDOSCOPY;  Service: Cardiovascular;  Laterality: N/A;  . Loop recorder implant N/A 09/03/2014    Procedure: LOOP RECORDER IMPLANT;  Surgeon: Thompson Grayer, MD;  Location: [facility] CATH LAB;  Service: Cardiovascular;  Laterality: N/A;  . Cardiac catheterization N/A 01/14/2015    Procedure: Left Heart Cath and Coronary Angiography;  Surgeon: Charolette Forward, MD;  Location: [place] CV LAB;  Service: Cardiovascular;  Laterality: N/A;  . Cardiac catheterization  ~ 2011  . Dilation and curettage of uterus  "probably"  . Coronary angioplasty      There were no vitals filed for this visit.  Visit Diagnosis:  Visual disturbance  Cognitive deficits following cerebral infarction  Left homonymous hemianopsia  Lack of coordination due to stroke      Subjective Assessment - 04/29/15 1209    Subjective  Pt/son reports that pt was able to follow a movie (attend) to it more over the weekend   Patient is accompained by: Family member   Pertinent History CVA 08/19/14, hospitalization in 7/16 for cardiac procedure, see epic snapshot   Limitations hard of hearing, L homonymous hemianopsia   Patient Stated Goals Use LUE more   Currently in Pain? No/denies  OT Treatments/Exercises (OP) - 04/29/15 0001    Hand Exercises   Other Hand Exercises Picking up blocks using 25lbs sustained grip strength with mod difficulty and min cues for approx 30% of blocks and 15lbs for remaining.  Picked blocks up by color for visual scanning/awareness to the L side.  1-2 min v.c. needed for scanning during activity.   Visual/Perceptual Exercises   Pegboard copied medium peg design with min-mod v.c. and incr time.   (cued to look for edge of objects/scan all the way to the L).  Placing pegs in board with LUE for incr coordination with min difficulty.     Scanning - Environmental Ambulating to retrieve  items for visual scanning with approx 75% accuracy with no cues and remaining with min v.c. Scanning/navigation for incr safety with supervision.  Hit L foot on object x1, but no LOB.                 OT Education - 04/29/15 1256    Education Details Look for edge of object to make sure she is scanning all the way to the L   Person(s) Educated Patient;Child(ren)   Methods Explanation;Demonstration   Comprehension Verbalized understanding          OT Short Term Goals - 04/29/15 1203    OT SHORT TERM GOAL #1   Title Pt will be independent with initial HEP.--check STGs 03/05/15   Time 4   Period Weeks   Status Achieved   OT SHORT TERM GOAL #2   Title Pt will perform tabletop visual scanning with at least 90% using compensation strategies prn without cues.   Baseline 75% with min cues   Time 4   Period Weeks   Status Achieved  04/03/15 approx 91-96%   OT SHORT TERM GOAL #3   Title Pt will improve coordination for ADLs as shown by improving time on 9-hole peg test by at least 10sec with LUE.   Baseline 75.10sec   Time 4   Period Weeks   Status Achieved  03/10/15 103.47sec; 04/01/15 62.25sec   OT SHORT TERM GOAL #4   Title Pt will demo at least 90% gross finger flex for grasping objects with LUE.   Baseline 75%   Time 4   Period Weeks   Status Achieved  approx this level   OT SHORT TERM GOAL #5   Title Pt will be able to complete fasteners mod I consistently.   Time 4   Period Weeks   Status Achieved  per pt report, does with difficulty   OT SHORT TERM GOAL #6   Title Pt will perform simple environmental scanning with at least 50% accuracy for improved safety.--check 05/03/15   Time 4   Period Weeks   Status Achieved  04/24/15   77%    OT SHORT TERM GOAL #7   Title Pt will improve coordination for ADLs as shown by improving time on 9-hole peg test to less than 55sec with LUE.--check 05/03/15   Time 4   Period Weeks   Status New   OT SHORT TERM GOAL #8   Title  Pt will demo at least 20lbs L grip strength to assist in opening containers.--check 05/03/15   Time 4   Period Weeks   Status Not Met  04/24/15:  16lbs           OT Long Term Goals - 04/29/15 1159    OT LONG TERM GOAL #1   Title Pt will be independent  with updated HEP.--check 06/02/15   Time 8   Period Weeks   Status Achieved  04/01/15:  initial HEP remains appropriate; 04/24/15 updated putty HEP and review cognitive/visual activities with pt/son   OT LONG TERM GOAL #2   Title Pt will verbalize understanding of cognitive compensation strategies for ADLs prn.--check 06/02/15   Time 8   Period Weeks   Status Achieved  04/03/15  not met/not fully addressed; 04/17/15:  met   OT LONG TERM GOAL #3   Title Pt will perform simple environmental scanning with at least 75% accuracy for improved safety.--check 06/02/15   Time 8   Period Weeks   Status Revised  04/03/15 approx 30%    OT LONG TERM GOAL #4   Title Pt will improve coordination for ADLs as shown by improving time on 9-hole peg test to less than 50sec with LUE.--check 06/02/15   Baseline 75.10sec   Time 8   Period Weeks   Status Revised  Not met 04/01/15  62.25sec   OT LONG TERM GOAL #5   Title Pt will demo at least 25lbs L grip strength to assist in opening containers.--check 06/02/15   Baseline 0lbs   Time 8   Period Weeks   Status Not Met  Not met 04/01/15  14lbs;  04/29/15 14lbs   OT LONG TERM GOAL #6   Title Pt will perform simple snack prep/meal prep with supervision.--check 06/02/15   Baseline not performing   Time 8   Period Weeks   Status Partially Met  04/03/15 not met.  04/29/15 met for snack prep/simple cooking task               Plan - 04/29/15 1244    Clinical Impression Statement Pt continues to make slow progress with attention and visual scanning/attention to L side.  Plan to d/c next session   Plan environmental scanning, 9-hole peg test, d/c next session   Consulted and Agree with Plan of  Care Patient;Family member/caregiver   Family Member Consulted son        Problem List Patient Active Problem List   Diagnosis Date Noted  . New-onset angina ([place]) 01/14/2015  . Left homonymous hemianopsia 09/06/2014  . Occipital infarction ([place]) 09/05/2014  . AKI (acute kidney injury) ([place])   . Acute ischemic right MCA stroke ([place]) 08/30/2014  . Acute right PCA stroke ([place]) 08/30/2014  . Abnormal chest x-ray 08/22/2014  . Type 2 diabetes mellitus with diabetic neuropathy ([place]) 01/04/2014  . HYPERCHOLESTEROLEMIA, MILD 05/22/2009  . PERS HX NONCOMPLIANCE W/MED TX PRS HAZARDS HLTH 07/11/2007  . Essential hypertension 05/04/2007  . Osteoarthritis 05/04/2007  . Fibromyalgia 05/04/2007    [facility] 04/29/2015, 12:57 PM  [place] [address] [place], [place], [address] Phone: [phone]   Fax:  [phone]  Name: AALYAH MANSOURI MRN: [id number] Date of Birth: [birth year]  Vianne Bulls, OTR/L 04/29/2015 12:57 PM

## 2015-05-01 ENCOUNTER — Ambulatory Visit: Payer: Medicare HMO | Admitting: Occupational Therapy

## 2015-05-01 ENCOUNTER — Ambulatory Visit (INDEPENDENT_AMBULATORY_CARE_PROVIDER_SITE_OTHER): Payer: Medicare HMO | Admitting: *Deleted

## 2015-05-01 DIAGNOSIS — H539 Unspecified visual disturbance: Secondary | ICD-10-CM

## 2015-05-01 DIAGNOSIS — H53462 Homonymous bilateral field defects, left side: Secondary | ICD-10-CM | POA: Diagnosis not present

## 2015-05-01 DIAGNOSIS — I638 Other cerebral infarction: Secondary | ICD-10-CM

## 2015-05-01 DIAGNOSIS — I6389 Other cerebral infarction: Secondary | ICD-10-CM

## 2015-05-01 DIAGNOSIS — I69319 Unspecified symptoms and signs involving cognitive functions following cerebral infarction: Secondary | ICD-10-CM

## 2015-05-01 DIAGNOSIS — IMO0002 Reserved for concepts with insufficient information to code with codable children: Secondary | ICD-10-CM

## 2015-05-01 DIAGNOSIS — M25642 Stiffness of left hand, not elsewhere classified: Secondary | ICD-10-CM

## 2015-05-01 NOTE — Progress Notes (Signed)
Carelink Summary Report / Loop recorder 

## 2015-05-01 NOTE — Therapy (Signed)
[facility] [address] [place], [place], [address] Phone: [phone]   Fax:  [phone]  Occupational Therapy Treatment  Patient Details  Name: Kayla [name] MRN: [id number] Date of Birth: 11/04/1940 No Data Recorded  Encounter Date: 05/01/2015      OT End of Session - 05/01/15 1335    Visit Number 17   Number of Visits 26   Date for OT Re-Evaluation 06/02/15   Authorization Type [employer] Medicare, no visit limit/auth, G-code needed   Authorization Time Period wk 4/8   Authorization - Visit Number 59   Authorization - Number of Visits 20   OT Start Time 1317   OT Stop Time 1400   OT Time Calculation (min) 43 min   Activity Tolerance Patient tolerated treatment well   Behavior During Therapy [facility] for tasks assessed/performed      Past Medical History  Diagnosis Date  . Asthmatic bronchitis   . Hypertension   . Venous insufficiency   . Hypercholesteremia   . Diverticulosis of colon   . Constipation   . Prolapse of vaginal vault after hysterectomy   . Proteinuria   . Fibromyalgia   . Somatic dysfunction   . Anxiety   . Personal history of noncompliance with medical treatment, presenting hazards to health   . Sickle-cell trait ([place])   . Allergy     Shell fish  . GERD (gastroesophageal reflux disease)   . Heart murmur   . Coronary artery disease   . Type II diabetes mellitus ([place])   . Blind left eye   . DJD (degenerative joint disease)   . Arthritis     "knees, back, probably left hand" (01/14/2015)  . History of gout   . Depression   . Stroke [facility]) ~ 08/2014    "blind in left eye; weak on left side since" (01/14/2015)    Past Surgical History  Procedure Laterality Date  . Abdominal hysterectomy    . Robotic assisted laparoscopic sacrocolpopexy  09/2009    Dr. Matilde Sprang  . Cataract extraction w/ intraocular lens  implant, bilateral Bilateral 2012  . Tee without cardioversion N/A 09/02/2014   Procedure: TRANSESOPHAGEAL ECHOCARDIOGRAM (TEE);  Surgeon: Lelon Perla, MD;  Location: [facility] ENDOSCOPY;  Service: Cardiovascular;  Laterality: N/A;  . Loop recorder implant N/A 09/03/2014    Procedure: LOOP RECORDER IMPLANT;  Surgeon: Thompson Grayer, MD;  Location: [facility] CATH LAB;  Service: Cardiovascular;  Laterality: N/A;  . Cardiac catheterization N/A 01/14/2015    Procedure: Left Heart Cath and Coronary Angiography;  Surgeon: Charolette Forward, MD;  Location: [place] CV LAB;  Service: Cardiovascular;  Laterality: N/A;  . Cardiac catheterization  ~ 2011  . Dilation and curettage of uterus  "probably"  . Coronary angioplasty      There were no vitals filed for this visit.  Visit Diagnosis:  Visual disturbance  Cognitive deficits following cerebral infarction  Left homonymous hemianopsia  Lack of coordination due to stroke  Stiffness of left hand joint      Subjective Assessment - 05/01/15 1319    Subjective  "I've been running today"   Pertinent History CVA 08/19/14, hospitalization in 7/16 for cardiac procedure, see epic snapshot   Limitations hard of hearing, L homonymous hemianopsia   Patient Stated Goals Use LUE more   Currently in Pain? No/denies                      OT Treatments/Exercises (OP) - 05/01/15  0001    Visual/Perceptual Exercises   Scanning - Environmental Ambulating to retrieve items for visual scanning with approx 77-85% accuracy during 2 trials.   Scanning - Tabletop to match digitall times with clock faces with times placed on L side to encourage visual scanning, attention, and cognitive skills.  Pt needed min-occasional mod cues.     Discussed progress and importance of continuing recommended HEP/activities for home and incr participation in IADL performance with supervision.  Pt verbalized understanding.             OT Short Term Goals - 05/01/15 1326    OT SHORT TERM GOAL #1   Title Pt will be independent with initial HEP.--check  STGs 03/05/15   Time 4   Period Weeks   Status Achieved   OT SHORT TERM GOAL #2   Title Pt will perform tabletop visual scanning with at least 90% using compensation strategies prn without cues.   Baseline 75% with min cues   Time 4   Period Weeks   Status Achieved  04/03/15 approx 91-96%   OT SHORT TERM GOAL #3   Title Pt will improve coordination for ADLs as shown by improving time on 9-hole peg test by at least 10sec with LUE.   Baseline 75.10sec   Time 4   Period Weeks   Status Achieved  03/10/15 103.47sec; 04/01/15 62.25sec   OT SHORT TERM GOAL #4   Title Pt will demo at least 90% gross finger flex for grasping objects with LUE.   Baseline 75%   Time 4   Period Weeks   Status Achieved  approx this level   OT SHORT TERM GOAL #5   Title Pt will be able to complete fasteners mod I consistently.   Time 4   Period Weeks   Status Achieved  per pt report, does with difficulty   OT SHORT TERM GOAL #6   Title Pt will perform simple environmental scanning with at least 50% accuracy for improved safety.--check 05/03/15   Time 4   Period Weeks   Status Achieved  04/24/15   77%    OT SHORT TERM GOAL #7   Title Pt will improve coordination for ADLs as shown by improving time on 9-hole peg test to less than 55sec with LUE.--check 05/03/15   Time 4   Period Weeks   Status Achieved  05/01/15  53.68, 50.69sec   OT SHORT TERM GOAL #8   Title Pt will demo at least 20lbs L grip strength to assist in opening containers.--check 05/03/15   Time 4   Period Weeks   Status Not Met  04/24/15:  16lbs           OT Long Term Goals - 05/01/15 1325    OT LONG TERM GOAL #1   Title Pt will be independent with updated HEP.--check 06/02/15   Time 8   Period Weeks   Status Achieved  04/01/15:  initial HEP remains appropriate; 04/24/15 updated putty HEP and review cognitive/visual activities with pt/son   OT LONG TERM GOAL #2   Title Pt will verbalize understanding of cognitive compensation  strategies for ADLs prn.--check 06/02/15   Time 8   Period Weeks   Status Achieved  04/03/15  not met/not fully addressed; 04/17/15:  met   OT LONG TERM GOAL #3   Title Pt will perform simple environmental scanning with at least 75% accuracy for improved safety.--check 06/02/15   Time 8   Period Weeks   Status Achieved  04/03/15 approx 30%, 2015-05-18 approx this level    OT LONG TERM GOAL #4   Title Pt will improve coordination for ADLs as shown by improving time on 9-hole peg test to less than 50sec with LUE.--check 06/02/15   Baseline 75.10sec   Time 8   Period Weeks   Status Not Met  Not met 04/01/15  62.25sec; 05/18/2015 53.68, 50.69sec   OT LONG TERM GOAL #5   Title Pt will demo at least 25lbs L grip strength to assist in opening containers.--check 06/02/15   Baseline 0lbs   Time 8   Period Weeks   Status Not Met  Not met 04/01/15  14lbs;  04/29/15 14lbs   OT LONG TERM GOAL #6   Title Pt will perform simple snack prep/meal prep with supervision.--check 06/02/15   Baseline not performing   Time 8   Period Weeks   Status Partially Met  04/03/15 not met.  04/29/15 met for snack prep/simple cooking task               Plan - 2015-05-18 1334    Clinical Impression Statement Pt demo good improvement, but continues to demo cognitive deficits, visual deficits, and decr LUE coordination, strength, and fucntional use.   Plan d/c OT   Consulted and Agree with Plan of Care Patient;Family member/caregiver          G-Codes - 05-18-2015 1354    Functional Assessment Tool Used L 9-hole peg test:  53.68, 50.69sec, L grip strength:  14-16lbs,  L gross finger flex WFL   Functional Limitation Carrying, moving and handling objects   Carrying, Moving and Handling Objects Goal Status (X4503) At least 20 percent but less than 40 percent impaired, limited or restricted   Carrying, Moving and Handling Objects Discharge Status [phone]) At least 20 percent but less than 40 percent impaired,  limited or restricted      OCCUPATIONAL THERAPY DISCHARGE SUMMARY  Visits from Start of Care: 17  Current functional level related to goals / functional outcomes: See above   Remaining deficits: Cognitive deficits, visual-perceptual deficits, L inattention, decreased coordination, decr strength, decr balance for ADLs--but all improved   Education / Equipment: Pt/family instructed in HEP for LUE, visual-perceptual skills, and cognition.  Pt/family instructed in safety concerns and compensation strategies.  Pt/family verbalized understanding. Plan: Patient agrees to discharge.  Patient goals were partially met. Patient is being discharged due to being pleased with the current functional level. (pt/family request). ?????       Problem List Patient Active Problem List   Diagnosis Date Noted  . New-onset angina ([place]) 01/14/2015  . Left homonymous hemianopsia 09/06/2014  . Occipital infarction ([place]) 09/05/2014  . AKI (acute kidney injury) ([place])   . Acute ischemic right MCA stroke ([place]) 08/30/2014  . Acute right PCA stroke ([place]) 08/30/2014  . Abnormal chest x-ray 08/22/2014  . Type 2 diabetes mellitus with diabetic neuropathy ([place]) 01/04/2014  . HYPERCHOLESTEROLEMIA, MILD 05/22/2009  . PERS HX NONCOMPLIANCE W/MED TX PRS HAZARDS HLTH 07/11/2007  . Essential hypertension 05/04/2007  . Osteoarthritis 05/04/2007  . Fibromyalgia 05/04/2007    [facility] 18-May-2015, 5:14 PM  [facility] [address] [place], [place], [address] Phone: [phone]   Fax:  [phone]  Name: Kayla [name] MRN: [id number] Date of Birth: 1940-10-22  Vianne Bulls, OTR/L 2015/05/18 5:14 PM

## 2015-05-02 ENCOUNTER — Encounter: Payer: Self-pay | Admitting: Internal Medicine

## 2015-05-08 ENCOUNTER — Encounter: Payer: Medicare HMO | Admitting: Occupational Therapy

## 2015-05-23 DIAGNOSIS — I638 Other cerebral infarction: Secondary | ICD-10-CM | POA: Diagnosis not present

## 2015-05-23 DIAGNOSIS — I208 Other forms of angina pectoris: Secondary | ICD-10-CM | POA: Diagnosis not present

## 2015-05-23 DIAGNOSIS — E785 Hyperlipidemia, unspecified: Secondary | ICD-10-CM | POA: Diagnosis not present

## 2015-05-23 DIAGNOSIS — E119 Type 2 diabetes mellitus without complications: Secondary | ICD-10-CM | POA: Diagnosis not present

## 2015-05-23 DIAGNOSIS — I2510 Atherosclerotic heart disease of native coronary artery without angina pectoris: Secondary | ICD-10-CM | POA: Diagnosis not present

## 2015-05-23 DIAGNOSIS — I10 Essential (primary) hypertension: Secondary | ICD-10-CM | POA: Diagnosis not present

## 2015-05-29 ENCOUNTER — Ambulatory Visit: Payer: Medicare HMO | Admitting: Neurology

## 2015-05-30 LAB — CUP PACEART REMOTE DEVICE CHECK: Date Time Interrogation Session: 20161110150720

## 2015-05-30 NOTE — Progress Notes (Signed)
Carelink summary report received. Battery status OK. Normal device function. No new symptom episodes, tachy episodes, brady, or pause episodes. No new AF episodes. Monthly summary reports and ROV with JA PRN. 

## 2015-06-02 ENCOUNTER — Ambulatory Visit (INDEPENDENT_AMBULATORY_CARE_PROVIDER_SITE_OTHER): Payer: Medicare HMO | Admitting: *Deleted

## 2015-06-02 DIAGNOSIS — I638 Other cerebral infarction: Secondary | ICD-10-CM | POA: Diagnosis not present

## 2015-06-02 DIAGNOSIS — I6389 Other cerebral infarction: Secondary | ICD-10-CM

## 2015-06-02 NOTE — Progress Notes (Signed)
Carelink Summary Report / Loop Recorder 

## 2015-06-05 ENCOUNTER — Other Ambulatory Visit: Payer: Self-pay | Admitting: Internal Medicine

## 2015-06-06 ENCOUNTER — Ambulatory Visit (INDEPENDENT_AMBULATORY_CARE_PROVIDER_SITE_OTHER): Payer: Medicare HMO | Admitting: Internal Medicine

## 2015-06-06 ENCOUNTER — Encounter: Payer: Self-pay | Admitting: Internal Medicine

## 2015-06-06 VITALS — BP 130/78 | HR 71 | Temp 97.7°F | Resp 14 | Ht 66.5 in | Wt 182.0 lb

## 2015-06-06 DIAGNOSIS — E1140 Type 2 diabetes mellitus with diabetic neuropathy, unspecified: Secondary | ICD-10-CM | POA: Diagnosis not present

## 2015-06-06 DIAGNOSIS — I10 Essential (primary) hypertension: Secondary | ICD-10-CM

## 2015-06-06 NOTE — Progress Notes (Signed)
Pre visit review using our clinic review tool, if applicable. No additional management support is needed unless otherwise documented below in the visit note. 

## 2015-06-06 NOTE — Patient Instructions (Signed)
You can bring the machine in to be tested.   Work on taking the senokot and the miralax daily for the constipation. You can also use magnesium citrate for faster results.   We will check the labs today for the sugars.   Stress and Stress Management Stress is a normal reaction to life events. It is what you feel when life demands more than you are used to or more than you can handle. Some stress can be useful. For example, the stress reaction can help you catch the last bus of the day, study for a test, or meet a deadline at work. But stress that occurs too often or for too long can cause problems. It can affect your emotional health and interfere with relationships and normal daily activities. Too much stress can weaken your immune system and increase your risk for physical illness. If you already have a medical problem, stress can make it worse. CAUSES  All sorts of life events may cause stress. An event that causes stress for one person may not be stressful for another person. Major life events commonly cause stress. These may be positive or negative. Examples include losing your job, moving into a new home, getting married, having a baby, or losing a loved one. Less obvious life events may also cause stress, especially if they occur day after day or in combination. Examples include working long hours, driving in traffic, caring for children, being in debt, or being in a difficult relationship. SIGNS AND SYMPTOMS Stress may cause emotional symptoms including, the following:  Anxiety. This is feeling worried, afraid, on edge, overwhelmed, or out of control.  Anger. This is feeling irritated or impatient.  Depression. This is feeling sad, down, helpless, or guilty.  Difficulty focusing, remembering, or making decisions. Stress may cause physical symptoms, including the following:   Aches and pains. These may affect your head, neck, back, stomach, or other areas of your body.  Tight muscles or  clenched jaw.  Low energy or trouble sleeping. Stress may cause unhealthy behaviors, including the following:   Eating to feel better (overeating) or skipping meals.  Sleeping too little, too much, or both.  Working too much or putting off tasks (procrastination).  Smoking, drinking alcohol, or using drugs to feel better. DIAGNOSIS  Stress is diagnosed through an assessment by your health care provider. Your health care provider will ask questions about your symptoms and any stressful life events.Your health care provider will also ask about your medical history and may order blood tests or other tests. Certain medical conditions and medicine can cause physical symptoms similar to stress. Mental illness can cause emotional symptoms and unhealthy behaviors similar to stress. Your health care provider may refer you to a mental health professional for further evaluation.  TREATMENT  Stress management is the recommended treatment for stress.The goals of stress management are reducing stressful life events and coping with stress in healthy ways.  Techniques for reducing stressful life events include the following:  Stress identification. Self-monitor for stress and identify what causes stress for you. These skills may help you to avoid some stressful events.  Time management. Set your priorities, keep a calendar of events, and learn to say "no." These tools can help you avoid making too many commitments. Techniques for coping with stress include the following:  Rethinking the problem. Try to think realistically about stressful events rather than ignoring them or overreacting. Try to find the positives in a stressful situation rather than focusing on   the negatives.  Exercise. Physical exercise can release both physical and emotional tension. The key is to find a form of exercise you enjoy and do it regularly.  Relaxation techniques. These relax the body and mind. Examples include yoga,  meditation, tai chi, biofeedback, deep breathing, progressive muscle relaxation, listening to music, being out in nature, journaling, and other hobbies. Again, the key is to find one or more that you enjoy and can do regularly.  Healthy lifestyle. Eat a balanced diet, get plenty of sleep, and do not smoke. Avoid using alcohol or drugs to relax.  Strong support network. Spend time with family, friends, or other people you enjoy being around.Express your feelings and talk things over with someone you trust. Counseling or talktherapy with a mental health professional may be helpful if you are having difficulty managing stress on your own. Medicine is typically not recommended for the treatment of stress.Talk to your health care provider if you think you need medicine for symptoms of stress. HOME CARE INSTRUCTIONS  Keep all follow-up visits as directed by your health care provider.  Take all medicines as directed by your health care provider. SEEK MEDICAL CARE IF:  Your symptoms get worse or you start having new symptoms.  You feel overwhelmed by your problems and can no longer manage them on your own. SEEK IMMEDIATE MEDICAL CARE IF:  You feel like hurting yourself or someone else.   This information is not intended to replace advice given to you by your health care provider. Make sure you discuss any questions you have with your health care provider.   Document Released: 12/01/2000 Document Revised: 06/28/2014 Document Reviewed: 01/30/2013 Elsevier Interactive Patient Education 2016 [employer]  

## 2015-06-07 NOTE — Assessment & Plan Note (Signed)
BP does not appear to be elevated today. They will bring in their machine to be checked to our office next week. She will continue atenolol, lisinopril, lopressor, imdur. HR okay. Will check last cardiology note to see if the duplicate beta blockers in intentional or a change in therapy with last medicine not discontinued. They did admit to taking all the medicines.

## 2015-06-07 NOTE — Progress Notes (Signed)
   Subjective:    Patient ID: Kayla [name], female    DOB: August 08, 1940, 74 y.o.   MRN: [id number]  HPI The patient is a 74 YO female coming in for concern of high blood pressure. She has been taking her medicines still. She has a machine at home and the pressures have been high for the last several weeks including earlier today. She has not felt any different. This is a new machine. No headaches, chest pains, SOB. No other concerns. There is history of stroke but no new stroke symptoms since last visit.   Review of Systems  Constitutional: Negative for fever, activity change, appetite change, fatigue and unexpected weight change.  HENT: Positive for hearing loss.   Respiratory: Negative for chest tightness and shortness of breath.   Cardiovascular: Negative for chest pain, palpitations and leg swelling.  Gastrointestinal: Negative.   Musculoskeletal: Negative.   Skin: Negative.   Neurological: Positive for weakness and numbness. Negative for dizziness, seizures, syncope, facial asymmetry, light-headedness and headaches.       Stable since prior exam      Objective:   Physical Exam  Constitutional: She is oriented to person, place, and time. She appears well-developed and well-nourished.  HENT:  Head: Normocephalic and atraumatic.  Eyes: EOM are normal.  Neck: Normal range of motion.  Cardiovascular: Normal rate and regular rhythm.   Pulmonary/Chest: Effort normal.  Abdominal: Soft.  Neurological: She is alert and oriented to person, place, and time. Coordination abnormal.  Skin: Skin is warm and dry.   Filed Vitals:   06/06/15 1516  BP: 130/78  Pulse: 71  Temp: 97.7 F (36.5 C)  TempSrc: Oral  Resp: 14  Height: 5' 6.5" (1.689 m)  Weight: 182 lb (82.555 kg)  SpO2: 97%      Assessment & Plan:

## 2015-06-07 NOTE — Assessment & Plan Note (Signed)
Stable nerve changes. Checking labs, did not fully discuss due to questions from the patient and her daughter. Taking metformin 500 mg BID and lisinopril.

## 2015-06-10 ENCOUNTER — Telehealth: Payer: Self-pay

## 2015-06-10 ENCOUNTER — Other Ambulatory Visit (INDEPENDENT_AMBULATORY_CARE_PROVIDER_SITE_OTHER): Payer: Medicare HMO

## 2015-06-10 DIAGNOSIS — E1140 Type 2 diabetes mellitus with diabetic neuropathy, unspecified: Secondary | ICD-10-CM | POA: Diagnosis not present

## 2015-06-10 LAB — HEMOGLOBIN A1C: Hgb A1c MFr Bld: 7.2 % — ABNORMAL HIGH (ref 4.6–6.5)

## 2015-06-10 LAB — COMPREHENSIVE METABOLIC PANEL
ALT: 10 U/L (ref 0–35)
AST: 12 U/L (ref 0–37)
Albumin: 3.6 g/dL (ref 3.5–5.2)
Alkaline Phosphatase: 79 U/L (ref 39–117)
BUN: 20 mg/dL (ref 6–23)
CHLORIDE: 105 meq/L (ref 96–112)
CO2: 28 meq/L (ref 19–32)
CREATININE: 1.07 mg/dL (ref 0.40–1.20)
Calcium: 9.2 mg/dL (ref 8.4–10.5)
GFR: 64.18 mL/min (ref >60.00)
Glucose, Bld: 178 mg/dL — ABNORMAL HIGH (ref 70–99)
POTASSIUM: 4.2 meq/L (ref 3.5–5.1)
SODIUM: 141 meq/L (ref 135–145)
Total Bilirubin: 0.5 mg/dL (ref 0.2–1.2)
Total Protein: 6.9 g/dL (ref 6.0–8.3)

## 2015-06-10 NOTE — Telephone Encounter (Signed)
Patient and daughter came in this morning to have their blood pressure machine checked. Patients blood pressure when checked on the machine was 179/110. When I checked the patients blood pressure manually it was 146/88. Patients daughter is going to call the insurance company to see if they can get a new BP machine, she states that they have had that one for a few years now. I advised patients daughter that if they had any further questions or need anything to let us know.[name]

## 2015-06-14 ENCOUNTER — Other Ambulatory Visit: Payer: Self-pay | Admitting: Internal Medicine

## 2015-06-24 ENCOUNTER — Telehealth: Payer: Self-pay | Admitting: Internal Medicine

## 2015-06-24 DIAGNOSIS — H53462 Homonymous bilateral field defects, left side: Secondary | ICD-10-CM | POA: Diagnosis not present

## 2015-06-24 DIAGNOSIS — E119 Type 2 diabetes mellitus without complications: Secondary | ICD-10-CM | POA: Diagnosis not present

## 2015-06-24 DIAGNOSIS — Z961 Presence of intraocular lens: Secondary | ICD-10-CM | POA: Diagnosis not present

## 2015-06-24 NOTE — Telephone Encounter (Signed)
Pt daughter called in and wants to know pts lab results   Best number [phone]  - pam

## 2015-06-25 NOTE — Telephone Encounter (Signed)
I spoke with patient's daughter. She wants to know if patient still needs to check blood sugar. Her sugars are controlled. Please advise, thanks.

## 2015-06-26 ENCOUNTER — Encounter: Payer: Self-pay | Admitting: Cardiology

## 2015-06-26 NOTE — Telephone Encounter (Signed)
She does not need to check her sugars but if she is feeling bad or shaky she can check to make sure it is not low.

## 2015-06-26 NOTE — Telephone Encounter (Signed)
Patient aware that she does not need to check sugars on a daily basis.

## 2015-06-30 ENCOUNTER — Telehealth: Payer: Self-pay | Admitting: Internal Medicine

## 2015-06-30 ENCOUNTER — Ambulatory Visit (INDEPENDENT_AMBULATORY_CARE_PROVIDER_SITE_OTHER): Payer: Medicare HMO | Admitting: *Deleted

## 2015-06-30 DIAGNOSIS — I6389 Other cerebral infarction: Secondary | ICD-10-CM

## 2015-06-30 DIAGNOSIS — I638 Other cerebral infarction: Secondary | ICD-10-CM

## 2015-06-30 NOTE — Telephone Encounter (Signed)
[name] called sate that [facility] Falling is no longer with with [facility] to they need permission from Dr. Sharlet Salina to transfer all of her med to [facility] on [address] ([address] Please help, Holland Falling # is [phone] or [phone]. If there is any question please call Pamala back at [phone]

## 2015-07-01 NOTE — Progress Notes (Signed)
Carelink Summary Report / Loop Recorder 

## 2015-07-02 ENCOUNTER — Encounter: Payer: Self-pay | Admitting: Cardiology

## 2015-07-11 ENCOUNTER — Encounter: Payer: Self-pay | Admitting: Cardiology

## 2015-07-14 ENCOUNTER — Other Ambulatory Visit: Payer: Self-pay

## 2015-07-14 ENCOUNTER — Ambulatory Visit (INDEPENDENT_AMBULATORY_CARE_PROVIDER_SITE_OTHER): Payer: Medicare HMO

## 2015-07-14 VITALS — BP 138/80 | Ht 65.5 in | Wt 183.5 lb

## 2015-07-14 DIAGNOSIS — Z0000 Encounter for general adult medical examination without abnormal findings: Secondary | ICD-10-CM | POA: Diagnosis not present

## 2015-07-14 NOTE — Progress Notes (Signed)
Subjective:   Kayla [name] is a 75 y.o. female who presents for Medicare Annual (Subsequent) preventive examination.  Review of Systems:  HRA assessment completed during visit; [place], [place] The Patient was informed that this wellness visit is to identify risk and educate on how to reduce risk for increase disease through lifestyle changes.   ROS deferred to next CPE exam Neuropathy in diabetes/ no c/o; walking well today;  Kidney disease/ refer to PCP with annual  Cerebral infarct in end of Feb; almost a year out. Left side weak  Still 50 to 60% better; walk better; pain behind legs; gets tired;  DM2  Psychosocial; Dtr Jeannene Patella)  brings to apt. Monitors spouse with initial memory issues Father stroke; mother had dementia; brother lung cancer and sister DM' HTN/ all deceased  Lives with spouse who had mild memory deficits Lipids chol 169; trig 124; HDL 34; LDL 110/ A1c 7.8; 8.1; 7/2 in 05/2015   BMI: 30 Diet; Trying to eat; pam fixes food; children monitor meals;  Cream of wheat one day and oatmeal; blue berries Supper; meats and vegetables;  Leftovers; not particular hungry;   Exercise; lifts barbells; was told to walk up and down the hall   SAFETY. Pam helps with bath_ shower is tiring  Safety reviewed for the home; clear paths through the home, railing as needed down the hall; bathroom safety and has walk in shower; community safety; smoke detectors and firearms safety  Sun protection; she loves to be out in the sun;  Stressors; States she gets discouraged during the week;  Discouraged because she was very active; feel like a burden; Visit others; likes to take care of others; was cab driver for other;  Doesn't drive!!!   Discussed adult center for enrichment;  Does not like the [place];    Medication review/ may need glucometer but Dr. Sharlet Salina did not thing this was necessary at this time; A1c and diet stable   Fall assessment x 1  But the patient stated she  reached down to pick up phone from the floor; States she got down low and could not get back up. Spouse could not get her up and had to call her son Discussed safety and not bending over or stooping down' Discussed getting up slowly; standing a few seconds before moving; Educated regarding toning exercises; sit to stand several times a day and to march in place. Agreed to start doing some exercises at home to build tone; Will check on silver sneakers and will try to go there as well if feasible.  Gait assessment; Get up and go is < 10 sec;  Uses the grocery cart at [place] and other shopping areas. Does not use a walker at home but does hold on to furniture if needed. Has a rail going down the hall /   Mobilization and Functional losses in the last year; functional loss has stabilized but does need assistance with ADL's; states this is more due to energy conservation. She still gets tired with AM care.   Sleep patterns: states she is up several times going to the bathroom.  Will plan to discuss with Dr. Sharlet Salina when she follows up for mood; but did tell her that may not want to start but one medicine at a time.    Urinary or fecal incontinence reviewed/ strong odor / gets up 3 to 4 times at hs / referred for fup with Dr. Sharlet Salina.    Counseling: Colonoscopy; 11/2012 EKG: 01/22/2015 Hearing:  hearing was just checked;  Dexa: Will speak with the doctor Mammagram 06/19/2014; time for another at [facility]; can have dexa scan there as well  Ophthalmology exam; 10/11/2014 neg for retinopathy/ repeated Jan 2017 / Dr. Katy Fitch Immunizations Due: zostavax/ will review with Dr. Sharlet Salina; Declines today;  Educated to check with insurance regarding coverage of Shingles vaccination on Part D or Part B and may have lower co-pay if provided on the Part D side  Current Care Team reviewed and updated Cardiac Risk Factors include: advanced age (>78mn, >>31women)     Objective:     Vitals: BP 138/80 mmHg   Ht 5' 5.5" (1.664 m)  Wt 183 lb 8 oz (83.235 kg)  BMI 30.06 kg/m2  Tobacco History  Smoking status  . Never Smoker   Smokeless tobacco  . Never Used     Counseling given: Yes   Past Medical History  Diagnosis Date  . Asthmatic bronchitis   . Hypertension   . Venous insufficiency   . Hypercholesteremia   . Diverticulosis of colon   . Constipation   . Prolapse of vaginal vault after hysterectomy   . Proteinuria   . Fibromyalgia   . Somatic dysfunction   . Anxiety   . Personal history of noncompliance with medical treatment, presenting hazards to health   . Sickle-cell trait ([place]   . Allergy     Shell fish  . GERD (gastroesophageal reflux disease)   . Heart murmur   . Coronary artery disease   . Type II diabetes mellitus ([place]   . Blind left eye   . DJD (degenerative joint disease)   . Arthritis     "knees, back, probably left hand" (01/14/2015)  . History of gout   . Depression   . Stroke ([facility] ~ 08/2014    "blind in left eye; weak on left side since" (01/14/2015)   Past Surgical History  Procedure Laterality Date  . Abdominal hysterectomy    . Robotic assisted laparoscopic sacrocolpopexy  09/2009    Dr. MMatilde Sprang . Cataract extraction w/ intraocular lens  implant, bilateral Bilateral 2012  . Tee without cardioversion N/A 09/02/2014    Procedure: TRANSESOPHAGEAL ECHOCARDIOGRAM (TEE);  Surgeon: BLelon Perla MD;  [facility];  Service: Cardiovascular;  Laterality: N/A;  . Loop recorder implant N/A 09/03/2014    Procedure: LOOP RECORDER IMPLANT;  Surgeon: JThompson Grayer MD;  [facility];  Service: Cardiovascular;  Laterality: N/A;  . Cardiac catheterization N/A 01/14/2015    Procedure: Left Heart Cath and Coronary Angiography;  Surgeon: MCharolette Forward MD;  [facility];  Service: Cardiovascular;  Laterality: N/A;  . Cardiac catheterization  ~ 2011  . Dilation and curettage of uterus  "probably"  . Coronary angioplasty      Family History  Problem Relation Age of Onset  . Prostate cancer Father   . Heart attack Neg Hx   . Stroke Neg Hx   . Hypertension Father   . Hypertension Sister    History  Sexual Activity  . Sexual Activity: No    Outpatient Encounter Prescriptions as of 07/14/2015  Medication Sig  . ACCU-CHEK AVIVA PLUS test strip CHECK BLOOD SUGAR ONCE DAILY  . aspirin 325 MG tablet Take 1 tablet (325 mg total) by mouth daily.  .Marland Kitchenatenolol (TENORMIN) 25 MG tablet Take 25 mg by mouth 2 (two) times daily.  .Marland Kitchenatorvastatin (LIPITOR) 20 MG tablet Take 1 tablet (20 mg total) by mouth daily at  6 PM.  . Blood Glucose Monitoring Suppl (ACCU-CHEK AVIVA PLUS) W/DEVICE KIT Test blood sugar once daily  . isosorbide mononitrate (IMDUR) 30 MG 24 hr tablet Take 30 mg by mouth daily.  [name] lisinopril (PRINIVIL,ZESTRIL) 20 MG tablet Take 20 mg by mouth daily.  . metFORMIN (GLUCOPHAGE) 500 MG tablet Take 1 tablet (500 mg total) by mouth 2 (two) times daily with a meal.  . metoprolol (LOPRESSOR) 50 MG tablet Take 50 mg by mouth 2 (two) times daily. Reported on 06/06/2015  . nitroGLYCERIN (NITROSTAT) 0.4 MG SL tablet Place 0.4 mg under the tongue every 5 (five) minutes as needed for chest pain.  . polyethylene glycol (MIRALAX / GLYCOLAX) packet Take 17 g by mouth daily as needed for moderate constipation.  . senna-docusate (SENOKOT-S) 8.6-50 MG per tablet Take 1 tablet by mouth at bedtime as needed for mild constipation or moderate constipation. Reported on 06/06/2015  . clopidogrel (PLAVIX) 75 MG tablet Take 75 mg by mouth daily. Reported on 06/06/2015  . glucose blood (ACCU-CHEK AVIVA) test strip Check blood sugar once daily (Patient not taking: Reported on 06/06/2015)  . metFORMIN (GLUCOPHAGE) 500 MG tablet TAKE 1 TABLET (500 MG TOTAL) BY MOUTH 2 (TWO) TIMES DAILY WITH A MEAL. (Patient not taking: Reported on 06/06/2015)  . metFORMIN (GLUCOPHAGE) 500 MG tablet TAKE 1 TABLET (500 MG TOTAL) BY MOUTH 2 (TWO) TIMES DAILY  WITH A MEAL.   No facility-administered encounter medications on file as of 07/14/2015.    Activities of Daily Living In your present state of health, do you have any difficulty performing the following activities: 07/14/2015 01/14/2015  Hearing? [place]? Y -  Difficulty concentrating or making decisions? N -  Walking or climbing stairs? Y -  Dressing or bathing? Y -  Doing errands, shopping? Tempie Donning  Preparing Food and eating ? Y -  Using the Toilet? N -  In the past six months, have you accidently leaked urine? Y -  Do you have problems with loss of bowel control? N -  Managing your Medications? Y -  Managing your Finances? Y -  Housekeeping or managing your Housekeeping? Y -    Patient Care Team: Hoyt Koch, MD as PCP - General (Internal Medicine)    Assessment:    Assessment   Patient presents for yearly preventative medicine examination. Medicare questionnaire screening were completed, i.e. Functional; fall risk; depression, memory loss and hearing.  MMSE was completed and was 27/30; failed drawing but vision may have been an issue. Well oriented; states her recall is slow but recall was 2/3 and completed 20 -3 x 5   All immunizations and health maintenance protocols were reviewed with the patient  Education provided for laboratory screens;    Medication reconciliation, past medical history, social history, problem list and allergies were reviewed in detail with the patient  Goals were established with regard to weight loss, exercise, and diet in compliance with medications based on the patient individualized risk;  Agreed to toning exercises as discussed above and possibly the attending the sliver sneakers  Admits to being teary eyed on most days. States she does feel very dependent and was very independent prior to stroke; Sometimes feels "Helpless" but not hopeless; Smiles when talking about the enrichment center and the fact she can go there and help  others, as she misses this;   End of life planning was discussed and dtr did take a copy of the AD per [place].  Exercise Activities and Dietary recommendations Silver sneakers and chair exercise or marching in place of she had something stable to hold on too. Declines the use of a walker.    Goals    . patient     1. Agreed to try to exercise, sitting up and down and marching in place 2. May try silver sneakers  3. May try to [facility] to [facility]      Fall Risk Fall Risk  07/14/2015 06/06/2015 09/07/2012  Falls in the past year? Yes Yes No  Number falls in past yr: 1 1 -  Injury with Fall? (No Data) No -   Depression Screen PHQ 2/9 Scores 07/14/2015 06/06/2015 09/11/2013 09/07/2012  PHQ - 2 Score 2 0 1 2  PHQ- 9 Score - - - 4    Referred to Dr. Sharlet Salina for up apt.,  Cognitive Testing No flowsheet data found.  Immunization History  Administered Date(s) Administered  . Influenza Whole 04/08/2010  . Pneumococcal Conjugate-13 11/10/2007  . Pneumococcal Polysaccharide-23 08/31/2007  . Tdap 05/14/2014   Screening Tests Health Maintenance  Topic Date Due  . ZOSTAVAX  10/12/1999  . DEXA SCAN  10/11/2004  . INFLUENZA VACCINE  09/19/2015 (Originally 01/20/2015)  . OPHTHALMOLOGY EXAM  10/11/2015  . HEMOGLOBIN A1C  12/09/2015  . FOOT EXAM  06/06/2016  . COLONOSCOPY  12/01/2017  . TETANUS/TDAP  05/14/2024  . PNA vac Low Risk Adult  Completed      Plan:   Check on refill for NTG and fup on bladder issues/ refill NTG and will fup with Dr. Sharlet Salina regarding bladder issues; Pharmacy updated per the dtr's request Ask about glucometer; Dr. Sharlet Salina felt DM and diet were stable at present;  Agreed to fup  regarding depressed mood/ scheduled with Dr. Sharlet Salina; STates she does cry and feels helpless and does not like everyone waiting on her.  Stated she felt a lot better after the assessment with a plan in place for more socialization and exercise   [employer]; [phone] [address]; [place] [phone]  Deaf & Hard of Hearing Division Services  No reviews  [facility]  [place] [id number]  [phone] Given information for adaptive devices; as well as assistance with hearing aid; could not hear audiometer today   Will consider Bone scan for bone density   Discussed zostavax; postpone  During the course of the visit the patient was educated and counseled about the following appropriate screening and preventive services:   Vaccines to include Pneumoccal, Influenza, Hepatitis B, Td, Zostavax, HCV  Electrocardiogram 01/22/2015   Cardiovascular Disease/ deferred  Colorectal cancer screening 11/2012; due 5 to 10 years  Bone density screening/ will consider;   Diabetes screening/ <8; no issues   Glaucoma screening; states this was repeated this year  Mammography /05/2014 discussed repeat at [facility] with Dexa scan this year.   Nutrition counseling provided  Patient Instructions (the written plan) was given to the patient.   Wanted refill on NTG: appears the current RX will expire 05/2016; Also no fill amount; will refill per Dr. Sharlet Salina tomorrow; Family states she has not taken any and felt it "may" be expired. Call to Mrs. Hege who stated she will check the bottle and call if refill needed;   Abelardo Seidner, RN  07/14/2015

## 2015-07-14 NOTE — Patient Instructions (Addendum)
Kayla [name] , Thank you for taking time to come for your Medicare Wellness Visit. I appreciate your ongoing commitment to your health goals. Please review the following plan we discussed and let me know if I can assist you in the future.   Check on refill for NTG (was not expired) and fup on bladder issues Ask about glucometer; and to up regarding depressed mood  [employer]; [phone] [address]; [place]  No reviews  [facility]  [id number]  [phone]  Will consider Bone scan for bone density;    GOAL:  Will determine 1. [facility] 2. Will do exercise / up and down from chair and walking in place. 3. [facility]  These are the goals we discussed: Goals    None      This is a list of the screening recommended for you and due dates:  Health Maintenance  Topic Date Due  . Shingles Vaccine  10/12/1999  . DEXA scan (bone density measurement)  10/11/2004  . Flu Shot  09/19/2015*  . Eye exam for diabetics  10/11/2015  . Hemoglobin A1C  12/09/2015  . Complete foot exam   06/06/2016  . Colon Cancer Screening  12/01/2017  . Tetanus Vaccine  05/14/2024  . Pneumonia vaccines  Completed  *Topic was postponed. The date shown is not the original due date.     Bone Densitometry Bone densitometry is an imaging test that uses a special X-ray to measure the amount of calcium and other minerals in your bones (bone density). This test is also known as a bone mineral density test or dual-energy X-ray absorptiometry (DXA). The test can measure bone density at your hip and your spine. It is similar to having a regular X-ray. You may have this test to:  Diagnose a condition that causes weak or thin bones (osteoporosis).  Predict your risk of a broken bone (fracture).  Determine how well osteoporosis  treatment is working. LET [facility] CARE PROVIDER KNOW ABOUT:  Any allergies you have.  All medicines you are taking, including vitamins, herbs, eye drops, creams, and over-the-counter medicines.  Previous problems you or members of your family have had with the use of anesthetics.  Any blood disorders you have.  Previous surgeries you have had.  Medical conditions you have.  Possibility of pregnancy.  Any other medical test you had within the previous 14 days that used contrast material. RISKS AND COMPLICATIONS Generally, this is a safe procedure. However, problems can occur and may include the following:  This test exposes you to a very small amount of radiation.  The risks of radiation exposure may be greater to unborn children. BEFORE THE PROCEDURE  Do not take any calcium supplements for 24 hours before having the test. You can otherwise eat and drink what you usually do.  Take off all metal jewelry, eyeglasses, dental appliances, and any other metal objects. PROCEDURE  You may lie on an exam table. There will be an X-ray generator below you and an imaging device above you.  Other devices, such as boxes or braces, may be used to position your body properly for the scan.  You will need to lie still while the machine slowly scans your body.  The images will show up on a computer monitor. AFTER THE PROCEDURE You may need more testing at  a later time.   This information is not intended to replace advice given to you by your health care provider. Make sure you discuss any questions you have with your health care provider.   Document Released: 06/29/2004 Document Revised: 06/28/2014 Document Reviewed: 11/15/2013 Elsevier Interactive Patient Education 2016 [place] in the Home  Falls can cause injuries. They can happen to people of all ages. There are many things you can do to make your home safe and to help prevent falls.  WHAT CAN I DO ON THE  OUTSIDE OF MY HOME?  Regularly fix the edges of walkways and driveways and fix any cracks.  Remove anything that might make you trip as you walk through a door, such as a raised step or threshold.  Trim any bushes or trees on the path to your home.  Use bright outdoor lighting.  Clear any walking paths of anything that might make someone trip, such as rocks or tools.  Regularly check to see if handrails are loose or broken. Make sure that both sides of any steps have handrails.  Any raised decks and porches should have guardrails on the edges.  Have any leaves, snow, or ice cleared regularly.  Use sand or salt on walking paths during winter.  Clean up any spills in your garage right away. This includes oil or grease spills. WHAT CAN I DO IN THE BATHROOM?   Use night lights.  Install grab bars by the toilet and in the tub and shower. Do not use towel bars as grab bars.  Use non-skid mats or decals in the tub or shower.  If you need to sit down in the shower, use a plastic, non-slip stool.  Keep the floor dry. Clean up any water that spills on the floor as soon as it happens.  Remove soap buildup in the tub or shower regularly.  Attach bath mats securely with double-sided non-slip rug tape.  Do not have throw rugs and other things on the floor that can make you trip. WHAT CAN I DO IN THE BEDROOM?  Use night lights.  Make sure that you have a light by your bed that is easy to reach.  Do not use any sheets or blankets that are too big for your bed. They should not hang down onto the floor.  Have a firm chair that has side arms. You can use this for support while you get dressed.  Do not have throw rugs and other things on the floor that can make you trip. WHAT CAN I DO IN THE KITCHEN?  Clean up any spills right away.  Avoid walking on wet floors.  Keep items that you use a lot in easy-to-reach places.  If you need to reach something above you, use a strong step  stool that has a grab bar.  Keep electrical cords out of the way.  Do not use floor polish or wax that makes floors slippery. If you must use wax, use non-skid floor wax.  Do not have throw rugs and other things on the floor that can make you trip. WHAT CAN I DO WITH MY STAIRS?  Do not leave any items on the stairs.  Make sure that there are handrails on both sides of the stairs and use them. Fix handrails that are broken or loose. Make sure that handrails are as long as the stairways.  Check any carpeting to make sure that it is firmly attached to the stairs. Fix any  carpet that is loose or worn.  Avoid having throw rugs at the top or bottom of the stairs. If you do have throw rugs, attach them to the floor with carpet tape.  Make sure that you have a light switch at the top of the stairs and the bottom of the stairs. If you do not have them, ask someone to add them for you. WHAT ELSE CAN I DO TO HELP PREVENT FALLS?  Wear shoes that:  Do not have high heels.  Have rubber bottoms.  Are comfortable and fit you well.  Are closed at the toe. Do not wear sandals.  If you use a stepladder:  Make sure that it is fully opened. Do not climb a closed stepladder.  Make sure that both sides of the stepladder are locked into place.  Ask someone to hold it for you, if possible.  Clearly mark and make sure that you can see:  Any grab bars or handrails.  First and last steps.  Where the edge of each step is.  Use tools that help you move around (mobility aids) if they are needed. These include:  Canes.  Walkers.  Scooters.  Crutches.  Turn on the lights when you go into a dark area. Replace any light bulbs as soon as they burn out.  Set up your furniture so you have a clear path. Avoid moving your furniture around.  If any of your floors are uneven, fix them.  If there are any pets around you, be aware of where they are.  Review your medicines with your doctor. Some  medicines can make you feel dizzy. This can increase your chance of falling. Ask your doctor what other things that you can do to help prevent falls.   This information is not intended to replace advice given to you by your health care provider. Make sure you discuss any questions you have with your health care provider.   Document Released: 04/03/2009 Document Revised: 10/22/2014 Document Reviewed: 07/12/2014 Elsevier Interactive Patient Education 2016 [place] Maintenance, Female Adopting a healthy lifestyle and getting preventive care can go a long way to promote health and wellness. Talk with your health care provider about what schedule of regular examinations is right for you. This is a good chance for you to check in with your provider about disease prevention and staying healthy. In between checkups, there are plenty of things you can do on your own. Experts have done a lot of research about which lifestyle changes and preventive measures are most likely to keep you healthy. Ask your health care provider for more information. WEIGHT AND DIET  Eat a healthy diet  Be sure to include plenty of vegetables, fruits, low-fat dairy products, and lean protein.  Do not eat a lot of foods high in solid fats, added sugars, or salt.  Get regular exercise. This is one of the most important things you can do for your health.  Most adults should exercise for at least 150 minutes each week. The exercise should increase your heart rate and make you sweat (moderate-intensity exercise).  Most adults should also do strengthening exercises at least twice a week. This is in addition to the moderate-intensity exercise.  Maintain a healthy weight  Body mass index (BMI) is a measurement that can be used to identify possible weight problems. It estimates body fat based on height and weight. Your health care provider can help determine your BMI and help you achieve or maintain a  healthy  weight.  For females 31 years of age and older:   A BMI below 18.5 is considered underweight.  A BMI of 18.5 to 24.9 is normal.  A BMI of 25 to 29.9 is considered overweight.  A BMI of 30 and above is considered obese.  Watch levels of cholesterol and blood lipids  You should start having your blood tested for lipids and cholesterol at 76 years of age, then have this test every 5 years.  You may need to have your cholesterol levels checked more often if:  Your lipid or cholesterol levels are high.  You are older than 76 years of age.  You are at high risk for heart disease.  CANCER SCREENING   Lung Cancer  Lung cancer screening is recommended for adults 32-45 years old who are at high risk for lung cancer because of a history of smoking.  A yearly low-dose CT scan of the lungs is recommended for people who:  Currently smoke.  Have quit within the past 15 years.  Have at least a 30-pack-year history of smoking. A pack year is smoking an average of one pack of cigarettes a day for 1 year.  Yearly screening should continue until it has been 15 years since you quit.  Yearly screening should stop if you develop a health problem that would prevent you from having lung cancer treatment.  Breast Cancer  Practice breast self-awareness. This means understanding how your breasts normally appear and feel.  It also means doing regular breast self-exams. Let your health care provider know about any changes, no matter how small.  If you are in your 20s or 30s, you should have a clinical breast exam (CBE) by a health care provider every 1-3 years as part of a regular health exam.  If you are 9 or older, have a CBE every year. Also consider having a breast X-ray (mammogram) every year.  If you have a family history of breast cancer, talk to your health care provider about genetic screening.  If you are at high risk for breast cancer, talk to your health care provider about  having an MRI and a mammogram every year.  Breast cancer gene (BRCA) assessment is recommended for women who have family members with BRCA-related cancers. BRCA-related cancers include:  Breast.  Ovarian.  Tubal.  Peritoneal cancers.  Results of the assessment will determine the need for genetic counseling and BRCA1 and BRCA2 testing. Cervical Cancer Your health care provider may recommend that you be screened regularly for cancer of the pelvic organs (ovaries, uterus, and vagina). This screening involves a pelvic examination, including checking for microscopic changes to the surface of your cervix (Pap test). You may be encouraged to have this screening done every 3 years, beginning at age 12.  For women ages 29-65, health care providers may recommend pelvic exams and Pap testing every 3 years, or they may recommend the Pap and pelvic exam, combined with testing for human papilloma virus (HPV), every 5 years. Some types of HPV increase your risk of cervical cancer. Testing for HPV may also be done on women of any age with unclear Pap test results.  Other health care providers may not recommend any screening for nonpregnant women who are considered low risk for pelvic cancer and who do not have symptoms. Ask your health care provider if a screening pelvic exam is right for you.  If you have had past treatment for cervical cancer or a condition that could lead  to cancer, you need Pap tests and screening for cancer for at least 20 years after your treatment. If Pap tests have been discontinued, your risk factors (such as having a new sexual partner) need to be reassessed to determine if screening should resume. Some women have medical problems that increase the chance of getting cervical cancer. In these cases, your health care provider may recommend more frequent screening and Pap tests. Colorectal Cancer  This type of cancer can be detected and often prevented.  Routine colorectal cancer  screening usually begins at 76 years of age and continues through 76 years of age.  Your health care provider may recommend screening at an earlier age if you have risk factors for colon cancer.  Your health care provider may also recommend using home test kits to check for hidden blood in the stool.  A small camera at the end of a tube can be used to examine your colon directly (sigmoidoscopy or colonoscopy). This is done to check for the earliest forms of colorectal cancer.  Routine screening usually begins at age 73.  Direct examination of the colon should be repeated every 5-10 years through 76 years of age. However, you may need to be screened more often if early forms of precancerous polyps or small growths are found. Skin Cancer  Check your skin from head to toe regularly.  Tell your health care provider about any new moles or changes in moles, especially if there is a change in a mole's shape or color.  Also tell your health care provider if you have a mole that is larger than the size of a pencil eraser.  Always use sunscreen. Apply sunscreen liberally and repeatedly throughout the day.  Protect yourself by wearing long sleeves, pants, a wide-brimmed hat, and sunglasses whenever you are outside. HEART DISEASE, DIABETES, AND HIGH BLOOD PRESSURE   High blood pressure causes heart disease and increases the risk of stroke. High blood pressure is more likely to develop in:  People who have blood pressure in the high end of the normal range (130-139/85-89 mm Hg).  People who are overweight or obese.  People who are African American.  If you are 42-70 years of age, have your blood pressure checked every 3-5 years. If you are 2 years of age or older, have your blood pressure checked every year. You should have your blood pressure measured twice--once when you are at a hospital or clinic, and once when you are not at a hospital or clinic. Record the average of the two measurements.  To check your blood pressure when you are not at a hospital or clinic, you can use:  An automated blood pressure machine at a pharmacy.  A home blood pressure monitor.  If you are between 58 years and 4 years old, ask your health care provider if you should take aspirin to prevent strokes.  Have regular diabetes screenings. This involves taking a blood sample to check your fasting blood sugar level.  If you are at a normal weight and have a low risk for diabetes, have this test once every three years after 76 years of age.  If you are overweight and have a high risk for diabetes, consider being tested at a younger age or more often. PREVENTING INFECTION  Hepatitis B  If you have a higher risk for hepatitis B, you should be screened for this virus. You are considered at high risk for hepatitis B if:  You were born in a country  where hepatitis B is common. Ask your health care provider which countries are considered high risk.  Your parents were born in a high-risk country, and you have not been immunized against hepatitis B (hepatitis B vaccine).  You have HIV or AIDS.  You use needles to inject street drugs.  You live with someone who has hepatitis B.  You have had sex with someone who has hepatitis B.  You get hemodialysis treatment.  You take certain medicines for conditions, including cancer, organ transplantation, and autoimmune conditions. Hepatitis C  Blood testing is recommended for:  Everyone born from [birth year] through 1966.  Anyone with known risk factors for hepatitis C. Sexually transmitted infections (STIs)  You should be screened for sexually transmitted infections (STIs) including gonorrhea and chlamydia if:  You are sexually active and are younger than 76 years of age.  You are older than 76 years of age and your health care provider tells you that you are at risk for this type of infection.  Your sexual activity has changed since you were last screened and  you are at an increased risk for chlamydia or gonorrhea. Ask your health care provider if you are at risk.  If you do not have HIV, but are at risk, it may be recommended that you take a prescription medicine daily to prevent HIV infection. This is called pre-exposure prophylaxis (PrEP). You are considered at risk if:  You are sexually active and do not regularly use condoms or know the HIV status of your partner(s).  You take drugs by injection.  You are sexually active with a partner who has HIV. Talk with your health care provider about whether you are at high risk of being infected with HIV. If you choose to begin PrEP, you should first be tested for HIV. You should then be tested every 3 months for as long as you are taking PrEP.  PREGNANCY   If you are premenopausal and you may become pregnant, ask your health care provider about preconception counseling.  If you may become pregnant, take 400 to 800 micrograms (mcg) of folic acid every day.  If you want to prevent pregnancy, talk to your health care provider about birth control (contraception). OSTEOPOROSIS AND MENOPAUSE   Osteoporosis is a disease in which the bones lose minerals and strength with aging. This can result in serious bone fractures. Your risk for osteoporosis can be identified using a bone density scan.  If you are 35 years of age or older, or if you are at risk for osteoporosis and fractures, ask your health care provider if you should be screened.  Ask your health care provider whether you should take a calcium or vitamin D supplement to lower your risk for osteoporosis.  Menopause may have certain physical symptoms and risks.  Hormone replacement therapy may reduce some of these symptoms and risks. Talk to your health care provider about whether hormone replacement therapy is right for you.  HOME CARE INSTRUCTIONS   Schedule regular health, dental, and eye exams.  Stay current with your immunizations.   Do  not use any tobacco products including cigarettes, chewing tobacco, or electronic cigarettes.  If you are pregnant, do not drink alcohol.  If you are breastfeeding, limit how much and how often you drink alcohol.  Limit alcohol intake to no more than 1 drink per day for nonpregnant women. One drink equals 12 ounces of beer, 5 ounces of wine, or 1 ounces of hard liquor.  Do not  use street drugs.  Do not share needles.  Ask your health care provider for help if you need support or information about quitting drugs.  Tell your health care provider if you often feel depressed.  Tell your health care provider if you have ever been abused or do not feel safe at home.   This information is not intended to replace advice given to you by your health care provider. Make sure you discuss any questions you have with your health care provider.   Document Released: 12/21/2010 Document Revised: 06/28/2014 Document Reviewed: 05/09/2013 Elsevier Interactive Patient Education [employer].

## 2015-07-15 ENCOUNTER — Telehealth (HOSPITAL_COMMUNITY): Payer: Self-pay | Admitting: Cardiac Rehabilitation

## 2015-07-15 NOTE — Telephone Encounter (Signed)
Multiple attempts to contact pt to enroll in cardiac rehab.  On 03/18/15 pt with high copay.  Pt undecided.  Pt given information about cardiac maintenance program. Pt will discuss with her daughter and contact us further.

## 2015-07-15 NOTE — Progress Notes (Signed)
Medical screening examination/treatment/procedure(s) were performed by non-physician practitioner and as supervising physician I was immediately available for consultation/collaboration. I agree with above. Elizabeth A Crawford, MD 

## 2015-07-18 ENCOUNTER — Encounter: Payer: Self-pay | Admitting: Cardiology

## 2015-07-19 LAB — CUP PACEART REMOTE DEVICE CHECK: Date Time Interrogation Session: 20161210153722

## 2015-07-21 ENCOUNTER — Telehealth: Payer: Self-pay | Admitting: Internal Medicine

## 2015-07-21 NOTE — Telephone Encounter (Signed)
Placard given to PCP.

## 2015-07-21 NOTE — Telephone Encounter (Signed)
Kayla [name] is calling in regards to placard.  She would like to pick up as soon as possible.

## 2015-07-21 NOTE — Telephone Encounter (Signed)
I have not received a placard form for this patient

## 2015-07-23 ENCOUNTER — Telehealth: Payer: Self-pay | Admitting: Internal Medicine

## 2015-07-23 ENCOUNTER — Encounter: Payer: Self-pay | Admitting: Cardiology

## 2015-07-23 NOTE — Telephone Encounter (Signed)
New Message  Pt dtr calling to speak w/ RN concerning how to send remote transmission- received letter; Please call back and discuss.

## 2015-07-24 NOTE — Telephone Encounter (Signed)
Spoke w/ pt daughter and instructed her how to send manual transmission w/ home monitor. Pt daughter verbalized understanding.

## 2015-07-25 ENCOUNTER — Encounter: Payer: Self-pay | Admitting: Internal Medicine

## 2015-07-25 ENCOUNTER — Ambulatory Visit (INDEPENDENT_AMBULATORY_CARE_PROVIDER_SITE_OTHER): Payer: Medicare HMO | Admitting: Internal Medicine

## 2015-07-25 VITALS — BP 138/88 | HR 80 | Temp 98.7°F | Resp 12 | Ht 65.0 in | Wt 184.4 lb

## 2015-07-25 DIAGNOSIS — F4321 Adjustment disorder with depressed mood: Secondary | ICD-10-CM | POA: Diagnosis not present

## 2015-07-25 MED ORDER — MIRTAZAPINE 15 MG PO TBDP
15.0000 mg | ORAL_TABLET | Freq: Every day | ORAL | Status: DC
Start: 2015-07-25 — End: 2015-07-25

## 2015-07-25 MED ORDER — MIRTAZAPINE 15 MG PO TABS: 15.0000 mg | ORAL_TABLET | Freq: Every day | ORAL | Status: DC

## 2015-07-25 NOTE — Patient Instructions (Signed)
We have sent in the medicine you can try for the help with boosting your spirits. It is called remeron and helps some with sleep. Take 1 pill 30 minutes before bedtime or in the evening.  If you have any problems or questions call us.  Work on Occupational hygienist activity.   Stress and Stress Management Stress is a normal reaction to life events. It is what you feel when life demands more than you are used to or more than you can handle. Some stress can be useful. For example, the stress reaction can help you catch the last bus of the day, study for a test, or meet a deadline at work. But stress that occurs too often or for too long can cause problems. It can affect your emotional health and interfere with relationships and normal daily activities. Too much stress can weaken your immune system and increase your risk for physical illness. If you already have a medical problem, stress can make it worse. CAUSES  All sorts of life events may cause stress. An event that causes stress for one person may not be stressful for another person. Major life events commonly cause stress. These may be positive or negative. Examples include losing your job, moving into a new home, getting married, having a baby, or losing a loved one. Less obvious life events may also cause stress, especially if they occur day after day or in combination. Examples include working long hours, driving in traffic, caring for children, being in debt, or being in a difficult relationship. SIGNS AND SYMPTOMS Stress may cause emotional symptoms including, the following:  Anxiety. This is feeling worried, afraid, on edge, overwhelmed, or out of control.  Anger. This is feeling irritated or impatient.  Depression. This is feeling sad, down, helpless, or guilty.  Difficulty focusing, remembering, or making decisions. Stress may cause physical symptoms, including the following:   Aches and pains. These may affect your head, neck, back,  stomach, or other areas of your body.  Tight muscles or clenched jaw.  Low energy or trouble sleeping. Stress may cause unhealthy behaviors, including the following:   Eating to feel better (overeating) or skipping meals.  Sleeping too little, too much, or both.  Working too much or putting off tasks (procrastination).  Smoking, drinking alcohol, or using drugs to feel better. DIAGNOSIS  Stress is diagnosed through an assessment by your health care provider. Your health care provider will ask questions about your symptoms and any stressful life events.Your health care provider will also ask about your medical history and may order blood tests or other tests. Certain medical conditions and medicine can cause physical symptoms similar to stress. Mental illness can cause emotional symptoms and unhealthy behaviors similar to stress. Your health care provider may refer you to a mental health professional for further evaluation.  TREATMENT  Stress management is the recommended treatment for stress.The goals of stress management are reducing stressful life events and coping with stress in healthy ways.  Techniques for reducing stressful life events include the following:  Stress identification. Self-monitor for stress and identify what causes stress for you. These skills may help you to avoid some stressful events.  Time management. Set your priorities, keep a calendar of events, and learn to say "no." These tools can help you avoid making too many commitments. Techniques for coping with stress include the following:  Rethinking the problem. Try to think realistically about stressful events rather than ignoring them or overreacting. Try to find the  positives in a stressful situation rather than focusing on the negatives.  Exercise. Physical exercise can release both physical and emotional tension. The key is to find a form of exercise you enjoy and do it regularly.  Relaxation techniques.  These relax the body and mind. Examples include yoga, meditation, tai chi, biofeedback, deep breathing, progressive muscle relaxation, listening to music, being out in nature, journaling, and other hobbies. Again, the key is to find one or more that you enjoy and can do regularly.  Healthy lifestyle. Eat a balanced diet, get plenty of sleep, and do not smoke. Avoid using alcohol or drugs to relax.  Strong support network. Spend time with family, friends, or other people you enjoy being around.Express your feelings and talk things over with someone you trust. Counseling or talktherapy with a mental health professional may be helpful if you are having difficulty managing stress on your own. Medicine is typically not recommended for the treatment of stress.Talk to your health care provider if you think you need medicine for symptoms of stress. HOME CARE INSTRUCTIONS  Keep all follow-up visits as directed by your health care provider.  Take all medicines as directed by your health care provider. SEEK MEDICAL CARE IF:  Your symptoms get worse or you start having new symptoms.  You feel overwhelmed by your problems and can no longer manage them on your own. SEEK IMMEDIATE MEDICAL CARE IF:  You feel like hurting yourself or someone else.   This information is not intended to replace advice given to you by your health care provider. Make sure you discuss any questions you have with your health care provider.   Document Released: 12/01/2000 Document Revised: 06/28/2014 Document Reviewed: 01/30/2013 Elsevier Interactive Patient Education [employer].

## 2015-07-25 NOTE — Progress Notes (Signed)
Pre visit review using our clinic review tool, if applicable. No additional management support is needed unless otherwise documented below in the visit note. 

## 2015-07-27 DIAGNOSIS — F4321 Adjustment disorder with depressed mood: Secondary | ICD-10-CM | POA: Insufficient documentation

## 2015-07-27 NOTE — Assessment & Plan Note (Signed)
Will rx remeron to try for the depression and sleeping symptoms. I am not sure if the crying spells are part of this as well. Concern for some pseudobulbar affect post stroke since it was a severe stroke.

## 2015-07-27 NOTE — Progress Notes (Signed)
   Subjective:    Patient ID: Kayla [name], female    DOB: 29-Jun-1940, 75 y.o.   MRN: [id number]  HPI The patient is a 75 YO female coming in for concern of depression. They had wellness exam which revealed positive depression screening. Her daughter is here with her today and provides some insight as well. She is struggling to adapt to needing more help since her stroke. She does not have any activities that she likes to do. She is used to working and to helping others all the time and does not like this well. She does cry sometimes. She is sleeping okay. Sometimes has a hard time staying asleep. No SI/HI. Has not ever been on depression medication in the past.   Review of Systems  Constitutional: Negative for fever, activity change, appetite change, fatigue and unexpected weight change.  HENT: Positive for hearing loss.   Respiratory: Negative for chest tightness and shortness of breath.   Cardiovascular: Negative for chest pain, palpitations and leg swelling.  Gastrointestinal: Negative.   Musculoskeletal: Negative.   Skin: Negative.   Neurological: Positive for weakness and numbness. Negative for dizziness, seizures, syncope, facial asymmetry, light-headedness and headaches.       Stable since prior exam      Objective:   Physical Exam  Constitutional: She is oriented to person, place, and time. She appears well-developed and well-nourished.  HENT:  Head: Normocephalic and atraumatic.  Eyes: EOM are normal.  Neck: Normal range of motion.  Cardiovascular: Normal rate and regular rhythm.   Pulmonary/Chest: Effort normal. No respiratory distress. She has no wheezes.  Abdominal: Soft.  Neurological: She is alert and oriented to person, place, and time. Coordination abnormal.  Skin: Skin is warm and dry.   Filed Vitals:   07/25/15 1431  BP: 138/88  Pulse: 80  Temp: 98.7 F (37.1 C)  TempSrc: Oral  Resp: 12  Height: 5\' 5"  (1.651 m)  Weight: 184 lb 6.4 oz (83.643 kg)  SpO2:  93%      Assessment & Plan:

## 2015-07-30 ENCOUNTER — Ambulatory Visit (INDEPENDENT_AMBULATORY_CARE_PROVIDER_SITE_OTHER): Payer: Medicare HMO | Admitting: *Deleted

## 2015-07-30 DIAGNOSIS — I638 Other cerebral infarction: Secondary | ICD-10-CM | POA: Diagnosis not present

## 2015-07-30 DIAGNOSIS — I6389 Other cerebral infarction: Secondary | ICD-10-CM

## 2015-07-30 NOTE — Progress Notes (Signed)
Carelink Summary Report / Loop Recorder 

## 2015-08-20 LAB — CUP PACEART REMOTE DEVICE CHECK: Date Time Interrogation Session: 20170109154000

## 2015-08-20 NOTE — Progress Notes (Signed)
Carelink summary report received. Battery status OK. Normal device function. No new symptom episodes, tachy episodes, brady, or pause episodes. No new AF episodes. Monthly summary reports and ROV/PRN 

## 2015-08-26 LAB — CUP PACEART REMOTE DEVICE CHECK: Date Time Interrogation Session: 20170208154011

## 2015-08-26 NOTE — Progress Notes (Signed)
Carelink summary report received. Battery status OK. Normal device function. No new symptom episodes, tachy episodes, brady, or pause episodes. No new AF episodes. Monthly summary reports and ROV/PRN 

## 2015-08-29 ENCOUNTER — Ambulatory Visit (INDEPENDENT_AMBULATORY_CARE_PROVIDER_SITE_OTHER): Payer: Medicare HMO | Admitting: *Deleted

## 2015-08-29 DIAGNOSIS — I6389 Other cerebral infarction: Secondary | ICD-10-CM

## 2015-08-29 DIAGNOSIS — I638 Other cerebral infarction: Secondary | ICD-10-CM

## 2015-08-29 NOTE — Progress Notes (Signed)
Carelink Summary Report / Loop Recorder 

## 2015-08-30 LAB — CUP PACEART REMOTE DEVICE CHECK: Date Time Interrogation Session: 20170310153900

## 2015-08-30 NOTE — Progress Notes (Signed)
Carelink summary report received. Battery status OK. Normal device function. No new symptom episodes, tachy episodes, brady, or pause episodes. No new AF episodes. Monthly summary reports and ROV/PRN 

## 2015-09-15 ENCOUNTER — Other Ambulatory Visit: Payer: Self-pay | Admitting: Internal Medicine

## 2015-09-18 ENCOUNTER — Telehealth: Payer: Self-pay | Admitting: Internal Medicine

## 2015-09-19 DIAGNOSIS — M1712 Unilateral primary osteoarthritis, left knee: Secondary | ICD-10-CM | POA: Diagnosis not present

## 2015-09-19 DIAGNOSIS — M6281 Muscle weakness (generalized): Secondary | ICD-10-CM | POA: Diagnosis not present

## 2015-09-25 ENCOUNTER — Ambulatory Visit (INDEPENDENT_AMBULATORY_CARE_PROVIDER_SITE_OTHER): Payer: Medicare HMO | Admitting: Family

## 2015-09-25 ENCOUNTER — Encounter: Payer: Self-pay | Admitting: Family

## 2015-09-25 VITALS — BP 150/92 | HR 75 | Temp 97.8°F | Resp 16 | Ht 65.0 in | Wt 182.0 lb

## 2015-09-25 DIAGNOSIS — I63511 Cerebral infarction due to unspecified occlusion or stenosis of right middle cerebral artery: Secondary | ICD-10-CM

## 2015-09-25 DIAGNOSIS — M8949 Other hypertrophic osteoarthropathy, multiple sites: Secondary | ICD-10-CM

## 2015-09-25 DIAGNOSIS — M159 Polyosteoarthritis, unspecified: Secondary | ICD-10-CM

## 2015-09-25 DIAGNOSIS — M150 Primary generalized (osteo)arthritis: Secondary | ICD-10-CM | POA: Diagnosis not present

## 2015-09-25 DIAGNOSIS — Z7409 Other reduced mobility: Secondary | ICD-10-CM | POA: Diagnosis not present

## 2015-09-25 DIAGNOSIS — R6889 Other general symptoms and signs: Secondary | ICD-10-CM

## 2015-09-25 DIAGNOSIS — R531 Weakness: Secondary | ICD-10-CM

## 2015-09-25 NOTE — Patient Instructions (Addendum)
Thank you for choosing [facility].  Summary/Instructions:  Please continue to ambulate with the walker.   Timed going to the bathroom as needed.  Home health physical therapy will be in contact.   If your symptoms worsen or fail to improve, please contact our office for further instruction, or in case of emergency go directly to the emergency room at the closest medical facility.

## 2015-09-25 NOTE — Progress Notes (Signed)
Pre visit review using our clinic review tool, if applicable. No additional management support is needed unless otherwise documented below in the visit note. 

## 2015-09-25 NOTE — Progress Notes (Signed)
Subjective:    Patient ID: Kayla [name], female    DOB: Jul 28, 1940, 75 y.o.   MRN: [id number]  Chief Complaint  Patient presents with  . Fall    states that her legs buckle and are weak, left knee is swollen    HPI:  PAHOLA [name] is a 75 y.o. female who  has a past medical history of Asthmatic bronchitis; Hypertension; Venous insufficiency; Hypercholesteremia; Diverticulosis of colon; Constipation; Prolapse of vaginal vault after hysterectomy; Proteinuria; Fibromyalgia; Somatic dysfunction; Anxiety; Personal history of noncompliance with medical treatment, presenting hazards to health; Sickle-cell trait ([place]); Allergy; GERD (gastroesophageal reflux disease); Heart murmur; Coronary artery disease; Type II diabetes mellitus ([place]); Blind left eye; DJD (degenerative joint disease); Arthritis; History of gout; Depression; and Stroke ([place]) (~ 08/2014). and presents today for an office follow up.  Recently sustained a fall while at home and describes that she has been experiencing the associated symptom of weakness and feeling like they are going to buckle at times. This has been going on for about 3 weeks. The course of the symptoms has stayed about the same. The severity is enough to effect her ability to walk. Notes that she is not able to walk like she used to. She has had 3 falls recently which are occuring when she generally is getting up to go to the bathroom. She currently uses a walker to ambulate. X-rays from her most recent fall showed no evidence of fracture. Denies any recent sickness or illness. She does have a previous history of a stroke. She has been taking her medications as prescribed but has not been checking her blood sugars or blood pressure at home.   Allergies  Allergen Reactions  . Fish Allergy Hives  . Penicillins Other (See Comments)    REACTION: red rash  . Shellfish Allergy Hives  . Peanuts [Peanut Oil] Rash  . Tomato Rash     Current Outpatient Prescriptions on  File Prior to Visit  Medication Sig Dispense Refill  . ACCU-CHEK AVIVA PLUS test strip CHECK BLOOD SUGAR ONCE DAILY 100 each 2  . aspirin 325 MG tablet Take 1 tablet (325 mg total) by mouth daily. 30 tablet 2  . atenolol (TENORMIN) 25 MG tablet Take 25 mg by mouth 2 (two) times daily.    [name] atorvastatin (LIPITOR) 20 MG tablet Take 1 tablet (20 mg total) by mouth daily at 6 PM. 30 tablet 3  . Blood Glucose Monitoring Suppl (ACCU-CHEK AVIVA PLUS) W/DEVICE KIT Test blood sugar once daily 1 kit 0  . clopidogrel (PLAVIX) 75 MG tablet Take 75 mg by mouth daily. Reported on 06/06/2015    . glucose blood (ACCU-CHEK AVIVA) test strip Check blood sugar once daily 100 each 4  . isosorbide mononitrate (IMDUR) 30 MG 24 hr tablet Take 30 mg by mouth daily.    [name] lisinopril (PRINIVIL,ZESTRIL) 20 MG tablet Take 20 mg by mouth daily.    . metFORMIN (GLUCOPHAGE) 500 MG tablet Take 1 tablet (500 mg total) by mouth 2 (two) times daily with a meal. 60 tablet 3  . metoprolol (LOPRESSOR) 50 MG tablet Take 50 mg by mouth 2 (two) times daily. Reported on 06/06/2015    . mirtazapine (REMERON) 15 MG tablet Take 1 tablet (15 mg total) by mouth at bedtime. 30 tablet 3  . nitroGLYCERIN (NITROSTAT) 0.4 MG SL tablet Place 0.4 mg under the tongue every 5 (five) minutes as needed for chest pain.    . polyethylene glycol (MIRALAX /  GLYCOLAX) packet Take 17 g by mouth daily as needed for moderate constipation.    . senna-docusate (SENOKOT-S) 8.6-50 MG per tablet Take 1 tablet by mouth at bedtime as needed for mild constipation or moderate constipation. Reported on 06/06/2015     No current facility-administered medications on file prior to visit.     Past Surgical History  Procedure Laterality Date  . Abdominal hysterectomy    . Robotic assisted laparoscopic sacrocolpopexy  09/2009    Dr. Sherron Monday  . Cataract extraction w/ intraocular lens  implant, bilateral Bilateral 2012  . Tee without cardioversion N/A 09/02/2014     Procedure: TRANSESOPHAGEAL ECHOCARDIOGRAM (TEE);  Surgeon: Lewayne Bunting, MD;  Location: [facility] ENDOSCOPY;  Service: Cardiovascular;  Laterality: N/A;  . Loop recorder implant N/A 09/03/2014    Procedure: LOOP RECORDER IMPLANT;  Surgeon: Hillis Range, MD;  Location: [facility] CATH LAB;  Service: Cardiovascular;  Laterality: N/A;  . Cardiac catheterization N/A 01/14/2015    Procedure: Left Heart Cath and Coronary Angiography;  Surgeon: Rinaldo Cloud, MD;  Location: [facility] INVASIVE CV LAB;  Service: Cardiovascular;  Laterality: N/A;  . Cardiac catheterization  ~ 2011  . Dilation and curettage of uterus  "probably"  . Coronary angioplasty      Past Medical History  Diagnosis Date  . Asthmatic bronchitis   . Hypertension   . Venous insufficiency   . Hypercholesteremia   . Diverticulosis of colon   . Constipation   . Prolapse of vaginal vault after hysterectomy   . Proteinuria   . Fibromyalgia   . Somatic dysfunction   . Anxiety   . Personal history of noncompliance with medical treatment, presenting hazards to health   . Sickle-cell trait ([facility])   . Allergy     Shell fish  . GERD (gastroesophageal reflux disease)   . Heart murmur   . Coronary artery disease   . Type II diabetes mellitus ([facility])   . Blind left eye   . DJD (degenerative joint disease)   . Arthritis     "knees, back, probably left hand" (01/14/2015)  . History of gout   . Depression   . Stroke [facility]) ~ 08/2014    "blind in left eye; weak on left side since" (01/14/2015)     Review of Systems  Constitutional: Positive for chills. Negative for fever.  Respiratory: Negative for chest tightness and shortness of breath.   Cardiovascular: Negative for chest pain, palpitations and leg swelling.  Neurological: Positive for weakness. Negative for dizziness and headaches.      Objective:    BP 150/92 mmHg  Pulse 75  Temp(Src) 97.8 F (36.6 C) (Oral)  Resp 16  Ht 5\' 5"  (1.651 m)  Wt 182 lb (82.555 kg)  BMI 30.29 kg/m2  SpO2  96% Nursing note and vital signs reviewed.  Physical Exam  Constitutional: She is oriented to person, place, and time. She appears well-developed and well-nourished. No distress.  Seated in the wheelchair in a house dress. Answers questions appropriately.   Cardiovascular: Normal rate, regular rhythm, normal heart sounds and intact distal pulses.   Pulmonary/Chest: Effort normal and breath sounds normal.  Musculoskeletal:  Significant weakness and decreased mobility requiring a close to 2 person assist without an ambulation device. During a 10 foot walk she tired very easily.  Neurological: She is alert and oriented to person, place, and time.  Skin: Skin is warm and dry.  Psychiatric: She has a normal mood and affect. Her behavior is normal. Judgment and thought  content normal.       Assessment & Plan:   Problem List Items Addressed This Visit      Cardiovascular and Mediastinum   Acute ischemic right MCA stroke [facility])   Relevant Orders   Ambulatory referral to [place]     Musculoskeletal and Integument   Osteoarthritis   Relevant Orders   Ambulatory referral to [place]     Other   Decreased strength, endurance, and mobility - Primary    Significantly decrease strength, endurance and mobility upon exam. There is concern for her safety as she has experienced 3 falls recently with minimal injury. Medications reviewed for polypharmacy and potential causes. Recommend home health physical therapy evaluation and treatment given her weakness and need for ambulation aid. She is not able to leave the home safely without assistance. Does have good potential for improvement.       Relevant Orders   Ambulatory referral to [place] am having Ms. Helwig maintain her ACCU-CHEK AVIVA PLUS, aspirin, glucose blood, clopidogrel, nitroGLYCERIN, isosorbide mononitrate, lisinopril, metoprolol, metFORMIN, polyethylene glycol, senna-docusate, atorvastatin, ACCU-CHEK AVIVA PLUS,  atenolol, and mirtazapine.    Follow-up: Return for Follow up with PCP .  Mauricio Po, FNP

## 2015-09-25 NOTE — Assessment & Plan Note (Signed)
Significantly decrease strength, endurance and mobility upon exam. There is concern for her safety as she has experienced 3 falls recently with minimal injury. Medications reviewed for polypharmacy and potential causes. Recommend home health physical therapy evaluation and treatment given her weakness and need for ambulation aid. She is not able to leave the home safely without assistance. Does have good potential for improvement.

## 2015-09-29 ENCOUNTER — Ambulatory Visit (INDEPENDENT_AMBULATORY_CARE_PROVIDER_SITE_OTHER): Payer: Medicare HMO | Admitting: *Deleted

## 2015-09-29 DIAGNOSIS — I638 Other cerebral infarction: Secondary | ICD-10-CM

## 2015-09-29 DIAGNOSIS — I6389 Other cerebral infarction: Secondary | ICD-10-CM

## 2015-09-29 NOTE — Progress Notes (Signed)
Carelink Summary Report / Loop Recorder 

## 2015-10-03 ENCOUNTER — Other Ambulatory Visit: Payer: Self-pay | Admitting: Internal Medicine

## 2015-10-07 DIAGNOSIS — M6281 Muscle weakness (generalized): Secondary | ICD-10-CM | POA: Diagnosis not present

## 2015-10-07 DIAGNOSIS — R262 Difficulty in walking, not elsewhere classified: Secondary | ICD-10-CM | POA: Diagnosis not present

## 2015-10-10 ENCOUNTER — Telehealth: Payer: Self-pay | Admitting: Cardiology

## 2015-10-10 DIAGNOSIS — R262 Difficulty in walking, not elsewhere classified: Secondary | ICD-10-CM | POA: Diagnosis not present

## 2015-10-10 DIAGNOSIS — M6281 Muscle weakness (generalized): Secondary | ICD-10-CM | POA: Diagnosis not present

## 2015-10-10 NOTE — Telephone Encounter (Signed)
Spoke w/ pt daughter and requested that pt send a manual transmission b/c her home monitor has not updated in at least 14 days.   

## 2015-10-14 DIAGNOSIS — M6281 Muscle weakness (generalized): Secondary | ICD-10-CM | POA: Diagnosis not present

## 2015-10-14 DIAGNOSIS — R262 Difficulty in walking, not elsewhere classified: Secondary | ICD-10-CM | POA: Diagnosis not present

## 2015-10-16 ENCOUNTER — Other Ambulatory Visit: Payer: Self-pay | Admitting: Internal Medicine

## 2015-10-16 DIAGNOSIS — M6281 Muscle weakness (generalized): Secondary | ICD-10-CM | POA: Diagnosis not present

## 2015-10-16 DIAGNOSIS — R262 Difficulty in walking, not elsewhere classified: Secondary | ICD-10-CM | POA: Diagnosis not present

## 2015-10-22 DIAGNOSIS — I10 Essential (primary) hypertension: Secondary | ICD-10-CM | POA: Diagnosis not present

## 2015-10-22 DIAGNOSIS — I25118 Atherosclerotic heart disease of native coronary artery with other forms of angina pectoris: Secondary | ICD-10-CM | POA: Diagnosis not present

## 2015-10-22 DIAGNOSIS — I639 Cerebral infarction, unspecified: Secondary | ICD-10-CM | POA: Diagnosis not present

## 2015-10-22 DIAGNOSIS — E119 Type 2 diabetes mellitus without complications: Secondary | ICD-10-CM | POA: Diagnosis not present

## 2015-10-22 DIAGNOSIS — E785 Hyperlipidemia, unspecified: Secondary | ICD-10-CM | POA: Diagnosis not present

## 2015-10-24 DIAGNOSIS — M6281 Muscle weakness (generalized): Secondary | ICD-10-CM | POA: Diagnosis not present

## 2015-10-24 DIAGNOSIS — R262 Difficulty in walking, not elsewhere classified: Secondary | ICD-10-CM | POA: Diagnosis not present

## 2015-10-27 ENCOUNTER — Telehealth: Payer: Self-pay | Admitting: Internal Medicine

## 2015-10-27 NOTE — Telephone Encounter (Signed)
Pt request to speak to the assistant concern about result of A1C. Please call her back

## 2015-10-27 NOTE — Telephone Encounter (Signed)
Called pt she stated she never received A1C results from dec. Gave MD response on lab report...Johny Chess

## 2015-10-28 ENCOUNTER — Ambulatory Visit (INDEPENDENT_AMBULATORY_CARE_PROVIDER_SITE_OTHER): Payer: Medicare HMO | Admitting: *Deleted

## 2015-10-28 DIAGNOSIS — R262 Difficulty in walking, not elsewhere classified: Secondary | ICD-10-CM | POA: Diagnosis not present

## 2015-10-28 DIAGNOSIS — M6281 Muscle weakness (generalized): Secondary | ICD-10-CM | POA: Diagnosis not present

## 2015-10-28 DIAGNOSIS — I638 Other cerebral infarction: Secondary | ICD-10-CM

## 2015-10-28 DIAGNOSIS — I6389 Other cerebral infarction: Secondary | ICD-10-CM

## 2015-10-29 NOTE — Progress Notes (Signed)
Carelink Summary Report / Loop Recorder 

## 2015-10-31 DIAGNOSIS — M6281 Muscle weakness (generalized): Secondary | ICD-10-CM | POA: Diagnosis not present

## 2015-10-31 DIAGNOSIS — R262 Difficulty in walking, not elsewhere classified: Secondary | ICD-10-CM | POA: Diagnosis not present

## 2015-11-04 ENCOUNTER — Telehealth: Payer: Self-pay

## 2015-11-04 DIAGNOSIS — M6281 Muscle weakness (generalized): Secondary | ICD-10-CM | POA: Diagnosis not present

## 2015-11-04 DIAGNOSIS — R262 Difficulty in walking, not elsewhere classified: Secondary | ICD-10-CM | POA: Diagnosis not present

## 2015-11-04 NOTE — Telephone Encounter (Signed)
I tried to call Kayla [name]. No answer. I left a message informing her to do as the cardiologist advised.

## 2015-11-04 NOTE — Telephone Encounter (Signed)
[name] called and said Kayla [name] bp is running between 154-156/86-88 , Cardiology is aware and told them he wanted her to take 20mg  in the am and pm they feel that maybe to much. They wanted to run all this by Dr.Crawford. If you would please call the daughter at  [phone] her name is Kayla [name].

## 2015-11-07 DIAGNOSIS — R262 Difficulty in walking, not elsewhere classified: Secondary | ICD-10-CM | POA: Diagnosis not present

## 2015-11-07 DIAGNOSIS — M6281 Muscle weakness (generalized): Secondary | ICD-10-CM | POA: Diagnosis not present

## 2015-11-11 DIAGNOSIS — M6281 Muscle weakness (generalized): Secondary | ICD-10-CM | POA: Diagnosis not present

## 2015-11-11 DIAGNOSIS — R262 Difficulty in walking, not elsewhere classified: Secondary | ICD-10-CM | POA: Diagnosis not present

## 2015-11-14 DIAGNOSIS — R262 Difficulty in walking, not elsewhere classified: Secondary | ICD-10-CM | POA: Diagnosis not present

## 2015-11-14 DIAGNOSIS — M6281 Muscle weakness (generalized): Secondary | ICD-10-CM | POA: Diagnosis not present

## 2015-11-16 LAB — CUP PACEART REMOTE DEVICE CHECK: Date Time Interrogation Session: 20170409153914

## 2015-11-16 NOTE — Progress Notes (Signed)
Carelink summary report received. Battery status OK. Normal device function. No new symptom episodes, tachy episodes, brady, or pause episodes. No new AF episodes. Monthly summary reports and ROV/PRN 

## 2015-11-17 DIAGNOSIS — R262 Difficulty in walking, not elsewhere classified: Secondary | ICD-10-CM | POA: Diagnosis not present

## 2015-11-17 DIAGNOSIS — M6281 Muscle weakness (generalized): Secondary | ICD-10-CM | POA: Diagnosis not present

## 2015-11-21 DIAGNOSIS — M6281 Muscle weakness (generalized): Secondary | ICD-10-CM | POA: Diagnosis not present

## 2015-11-21 DIAGNOSIS — R262 Difficulty in walking, not elsewhere classified: Secondary | ICD-10-CM | POA: Diagnosis not present

## 2015-11-25 DIAGNOSIS — I639 Cerebral infarction, unspecified: Secondary | ICD-10-CM | POA: Diagnosis not present

## 2015-11-25 DIAGNOSIS — E785 Hyperlipidemia, unspecified: Secondary | ICD-10-CM | POA: Diagnosis not present

## 2015-11-25 DIAGNOSIS — E119 Type 2 diabetes mellitus without complications: Secondary | ICD-10-CM | POA: Diagnosis not present

## 2015-11-25 DIAGNOSIS — I10 Essential (primary) hypertension: Secondary | ICD-10-CM | POA: Diagnosis not present

## 2015-11-25 DIAGNOSIS — I25118 Atherosclerotic heart disease of native coronary artery with other forms of angina pectoris: Secondary | ICD-10-CM | POA: Diagnosis not present

## 2015-11-27 ENCOUNTER — Ambulatory Visit (INDEPENDENT_AMBULATORY_CARE_PROVIDER_SITE_OTHER): Payer: Medicare HMO | Admitting: *Deleted

## 2015-11-27 DIAGNOSIS — I6389 Other cerebral infarction: Secondary | ICD-10-CM

## 2015-11-27 DIAGNOSIS — I638 Other cerebral infarction: Secondary | ICD-10-CM

## 2015-11-27 NOTE — Progress Notes (Signed)
Carelink Summary Report / Loop Recorder 

## 2015-12-02 DIAGNOSIS — N644 Mastodynia: Secondary | ICD-10-CM | POA: Diagnosis not present

## 2015-12-02 LAB — HM MAMMOGRAPHY

## 2015-12-03 ENCOUNTER — Encounter: Payer: Self-pay | Admitting: Internal Medicine

## 2015-12-05 LAB — CUP PACEART REMOTE DEVICE CHECK
Date Time Interrogation Session: 20170608170836
MDC IDC SESS DTM: 20170509163847

## 2015-12-08 ENCOUNTER — Ambulatory Visit (INDEPENDENT_AMBULATORY_CARE_PROVIDER_SITE_OTHER): Payer: Medicare HMO | Admitting: Internal Medicine

## 2015-12-08 ENCOUNTER — Encounter: Payer: Self-pay | Admitting: Internal Medicine

## 2015-12-08 ENCOUNTER — Other Ambulatory Visit (INDEPENDENT_AMBULATORY_CARE_PROVIDER_SITE_OTHER): Payer: Medicare HMO

## 2015-12-08 VITALS — BP 142/82 | HR 74 | Temp 98.6°F | Resp 16 | Ht 66.0 in | Wt 179.0 lb

## 2015-12-08 DIAGNOSIS — F4321 Adjustment disorder with depressed mood: Secondary | ICD-10-CM

## 2015-12-08 DIAGNOSIS — E1140 Type 2 diabetes mellitus with diabetic neuropathy, unspecified: Secondary | ICD-10-CM

## 2015-12-08 DIAGNOSIS — R69 Illness, unspecified: Secondary | ICD-10-CM | POA: Diagnosis not present

## 2015-12-08 DIAGNOSIS — I10 Essential (primary) hypertension: Secondary | ICD-10-CM | POA: Diagnosis not present

## 2015-12-08 LAB — COMPREHENSIVE METABOLIC PANEL
ALBUMIN: 4 g/dL (ref 3.5–5.2)
ALK PHOS: 72 U/L (ref 39–117)
ALT: 7 U/L (ref 0–35)
AST: 10 U/L (ref 0–37)
BILIRUBIN TOTAL: 0.4 mg/dL (ref 0.2–1.2)
BUN: 18 mg/dL (ref 6–23)
CO2: 22 mEq/L (ref 19–32)
Calcium: 9.2 mg/dL (ref 8.4–10.5)
Chloride: 105 mEq/L (ref 96–112)
Creatinine, Ser: 1.3 mg/dL — ABNORMAL HIGH (ref 0.40–1.20)
GFR: 51.19 mL/min — AB (ref >60.00)
GLUCOSE: 161 mg/dL — AB (ref 70–99)
POTASSIUM: 3.7 meq/L (ref 3.5–5.1)
Sodium: 140 mEq/L (ref 135–145)
TOTAL PROTEIN: 7.4 g/dL (ref 6.0–8.3)

## 2015-12-08 LAB — LIPID PANEL
CHOL/HDL RATIO: 3
Cholesterol: 100 mg/dL (ref 0–200)
HDL: 32.2 mg/dL — ABNORMAL LOW (ref >39.00)
LDL CALC: 48 mg/dL (ref 0–99)
NONHDL: 67.33
TRIGLYCERIDES: 96 mg/dL (ref 0.0–149.0)
VLDL: 19.2 mg/dL (ref 0.0–40.0)

## 2015-12-08 LAB — HEMOGLOBIN A1C: HEMOGLOBIN A1C: 6.9 % — AB (ref 4.6–6.5)

## 2015-12-08 NOTE — Progress Notes (Signed)
   Subjective:    Patient ID: ALLEEN RAUM, female    DOB: 07/29/1940, 75 y.o.   MRN: [id number]  HPI The patient is a 75 YO female coming in for follow up of her diabetes (taking metformin and doing well, complicated by past stroke, taking lisinopril and statin), and her blood pressure (doing well today, her cardiologist recently put her on amlodipine which caused her to have SOB and rash which she stopped 5 days ago, also taking atenolol, imdur, lisinopril). She is not having any new complaints except some mild rash which is left from the medication reaction she was having. Feels somewhat better since starting the remeron and is not having the crying spells she was having. Has more function than last visit which helps with how she is feeling.   Review of Systems  Constitutional: Negative for fever, activity change, appetite change, fatigue and unexpected weight change.  HENT: Positive for hearing loss.   Respiratory: Negative for chest tightness and shortness of breath.   Cardiovascular: Negative for chest pain, palpitations and leg swelling.  Gastrointestinal: Negative.   Musculoskeletal: Negative.   Skin: Positive for rash.  Neurological: Positive for weakness and numbness. Negative for dizziness, seizures, syncope, facial asymmetry, light-headedness and headaches.       Stable since prior exam  Psychiatric/Behavioral: Negative for suicidal ideas, confusion, sleep disturbance and decreased concentration. The patient is not nervous/anxious.       Objective:   Physical Exam  Constitutional: She is oriented to person, place, and time. She appears well-developed and well-nourished.  HENT:  Head: Normocephalic and atraumatic.  Eyes: EOM are normal.  Neck: Normal range of motion.  Cardiovascular: Normal rate and regular rhythm.   Pulmonary/Chest: Effort normal. No respiratory distress. She has no wheezes.  Abdominal: Soft.  Neurological: She is alert and oriented to person, place, and  time. Coordination abnormal.  Skin: Skin is warm and dry.  Psychiatric:  Affect is good.    Filed Vitals:   12/08/15 0926  BP: 142/82  Pulse: 74  Temp: 98.6 F (37 C)  TempSrc: Oral  Resp: 16  Height: 5\' 6"  (1.676 m)  Weight: 179 lb (81.194 kg)  SpO2: 97%      Assessment & Plan:

## 2015-12-08 NOTE — Assessment & Plan Note (Signed)
She is doing well on remeron and will continue for now. She is functionally doing somewhat better but there are still some mild deficits.

## 2015-12-08 NOTE — Progress Notes (Signed)
Pre visit review using our clinic review tool, if applicable. No additional management support is needed unless otherwise documented below in the visit note. 

## 2015-12-08 NOTE — Assessment & Plan Note (Signed)
Taking metformin and last Hga1c at goal, taking ACE-I and statin. Checking HgA1c at goal.

## 2015-12-08 NOTE — Patient Instructions (Signed)
We are checking the labs today and will call about the results.   Work on doing the exercises daily to help keep the strength up.   Use the miralax in some liquid in the morning every day to help keep the bowels easy to pass.   Fall Prevention in the Home  Falls can cause injuries and can affect people from all age groups. There are many simple things that you can do to make your home safe and to help prevent falls. WHAT CAN I DO ON THE OUTSIDE OF MY HOME?  Regularly repair the edges of walkways and driveways and fix any cracks.  Remove high doorway thresholds.  Trim any shrubbery on the main path into your home.  Use bright outdoor lighting.  Clear walkways of debris and clutter, including tools and rocks.  Regularly check that handrails are securely fastened and in good repair. Both sides of any steps should have handrails.  Install guardrails along the edges of any raised decks or porches.  Have leaves, snow, and ice cleared regularly.  Use sand or salt on walkways during winter months.  In the garage, clean up any spills right away, including grease or oil spills. WHAT CAN I DO IN THE BATHROOM?  Use night lights.  Install grab bars by the toilet and in the tub and shower. Do not use towel bars as grab bars.  Use non-skid mats or decals on the floor of the tub or shower.  If you need to sit down while you are in the shower, use a plastic, non-slip stool.[name]  Keep the floor dry. Immediately clean up any water that spills on the floor.  Remove soap buildup in the tub or shower on a regular basis.  Attach bath mats securely with double-sided non-slip rug tape.  Remove throw rugs and other tripping hazards from the floor. WHAT CAN I DO IN THE BEDROOM?  Use night lights.  Make sure that a bedside light is easy to reach.  Do not use oversized bedding that drapes onto the floor.  Have a firm chair that has side arms to use for getting dressed.  Remove throw rugs and  other tripping hazards from the floor. WHAT CAN I DO IN THE KITCHEN?   Clean up any spills right away.  Avoid walking on wet floors.  Place frequently used items in easy-to-reach places.  If you need to reach for something above you, use a sturdy step stool that has a grab bar.  Keep electrical cables out of the way.  Do not use floor polish or wax that makes floors slippery. If you have to use wax, make sure that it is non-skid floor wax.  Remove throw rugs and other tripping hazards from the floor. WHAT CAN I DO IN THE STAIRWAYS?  Do not leave any items on the stairs.  Make sure that there are handrails on both sides of the stairs. Fix handrails that are broken or loose. Make sure that handrails are as long as the stairways.  Check any carpeting to make sure that it is firmly attached to the stairs. Fix any carpet that is loose or worn.  Avoid having throw rugs at the top or bottom of stairways, or secure the rugs with carpet tape to prevent them from moving.  Make sure that you have a light switch at the top of the stairs and the bottom of the stairs. If you do not have them, have them installed. WHAT ARE SOME OTHER FALL  PREVENTION TIPS?  Wear closed-toe shoes that fit well and support your feet. Wear shoes that have rubber soles or low heels.  When you use a stepladder, make sure that it is completely opened and that the sides are firmly locked. Have someone hold the ladder while you are using it. Do not climb a closed stepladder.  Add color or contrast paint or tape to grab bars and handrails in your home. Place contrasting color strips on the first and last steps.  Use mobility aids as needed, such as canes, walkers, scooters, and crutches.  Turn on lights if it is dark. Replace any light bulbs that burn out.  Set up furniture so that there are clear paths. Keep the furniture in the same spot.  Fix any uneven floor surfaces.  Choose a carpet design that does not hide  the edge of steps of a stairway.  Be aware of any and all pets.  Review your medicines with your healthcare provider. Some medicines can cause dizziness or changes in blood pressure, which increase your risk of falling. Talk with your health care provider about other ways that you can decrease your risk of falls. This may include working with a physical therapist or trainer to improve your strength, balance, and endurance.   This information is not intended to replace advice given to you by your health care provider. Make sure you discuss any questions you have with your health care provider.   Document Released: 05/28/2002 Document Revised: 10/22/2014 Document Reviewed: 07/12/2014 Elsevier Interactive Patient Education [employer].

## 2015-12-08 NOTE — Assessment & Plan Note (Signed)
BP is okay today off the amlodipine. Will not add medicine today. On atenolol, imdur, lisinopril. Checking CMP.

## 2015-12-29 ENCOUNTER — Ambulatory Visit (INDEPENDENT_AMBULATORY_CARE_PROVIDER_SITE_OTHER): Payer: Medicare HMO | Admitting: *Deleted

## 2015-12-29 DIAGNOSIS — I638 Other cerebral infarction: Secondary | ICD-10-CM | POA: Diagnosis not present

## 2015-12-29 DIAGNOSIS — I6389 Other cerebral infarction: Secondary | ICD-10-CM

## 2015-12-29 NOTE — Progress Notes (Signed)
Carelink Summary Report / Loop Recorder 

## 2016-01-09 IMAGING — MR MR HEAD W/O CM
9 of 11 series · 30 of 48 positions shown · non-contrast
Comparison: CT head without contrast from the same day.

CLINICAL DATA: Confusion. Stroke symptoms. Unsteady. Abnormal
speech.

EXAM:
MRI HEAD WITHOUT CONTRAST
MRA HEAD WITHOUT CONTRAST
TECHNIQUE: Multiplanar, multiecho pulse sequences of the brain and surrounding
structures were obtained without intravenous contrast. Angiographic
images of the head were obtained using MRA technique without
contrast.

[Series 3: DWI · axial · 3.0mm · 1.09mm/px · z∈[-44,+100]mm · 7 of 98 slices shown (1 of 4)]
[im 1/98]
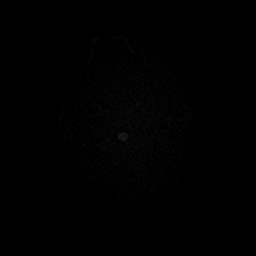
[im 17/98]
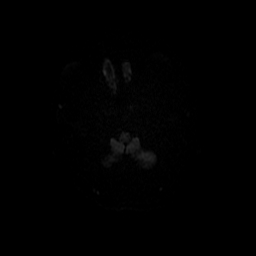
[im 33/98]
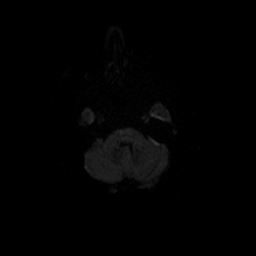
[im 49/98]
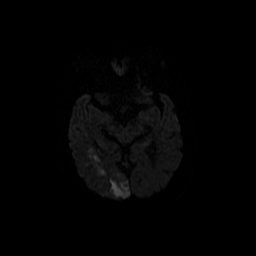
[im 65/98]
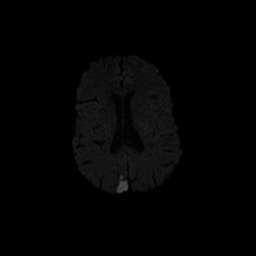
[im 81/98]
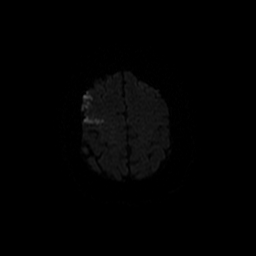
[im 98/98]
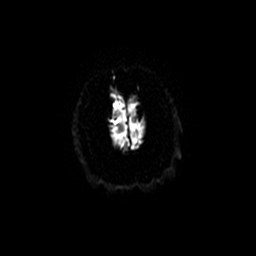

[Series 4: (id) mt fs · axial · 1.2mm · 0.43mm/px · z∈[-52,-9]mm · 4 of 174 slices shown]
[im 1/174]
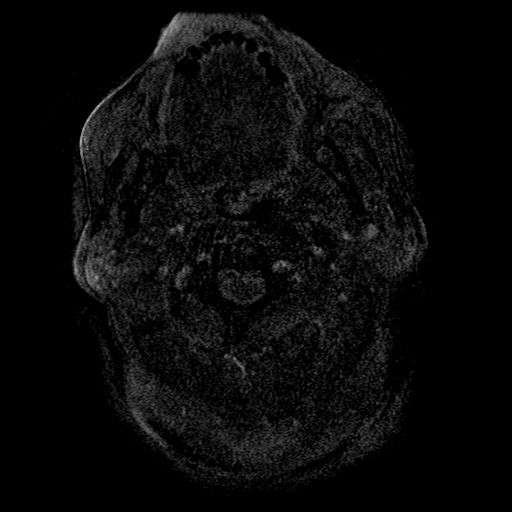
[im 29/174]
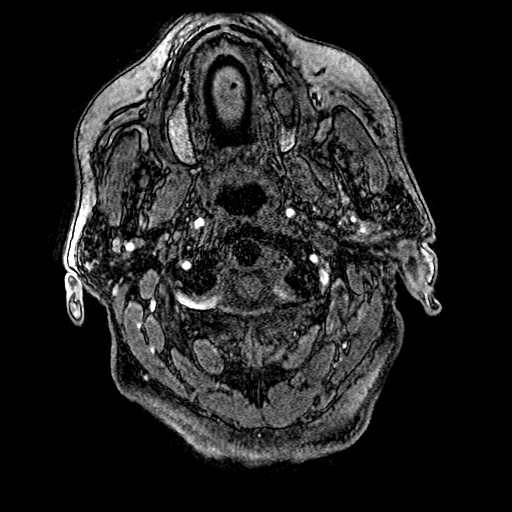
[im 58/174]
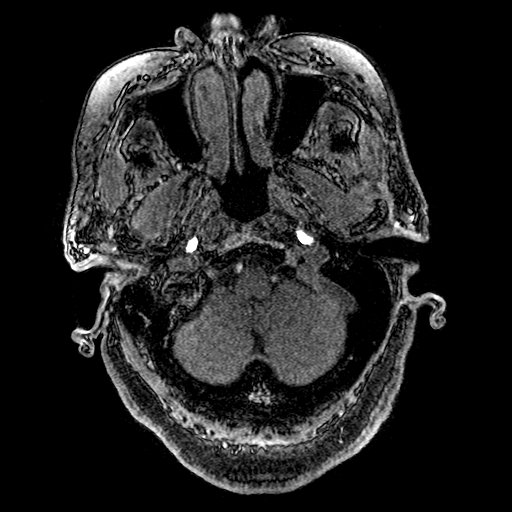
[im 73/174]
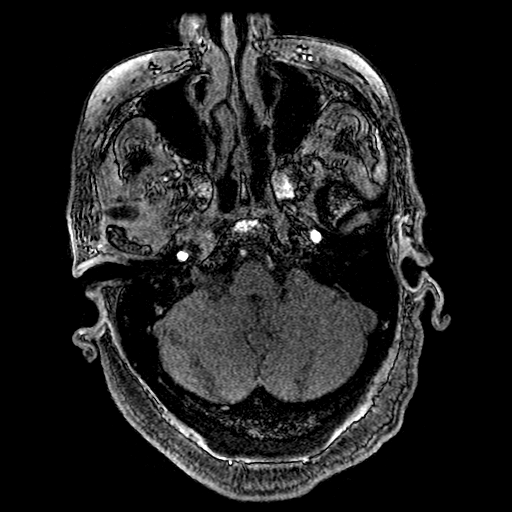

[Series 5: DWI · coronal · 5.0mm · 1.09mm/px · 5 of 66 slices shown (2 of 4)]
[im 1/66]
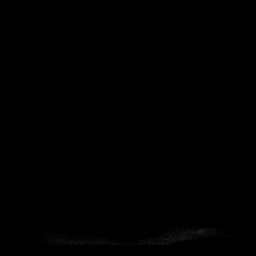
[im 17/66]
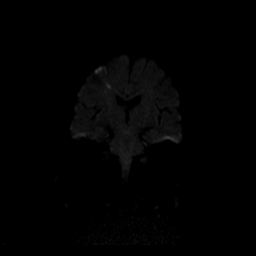
[im 33/66]
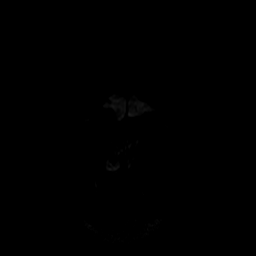
[im 49/66]
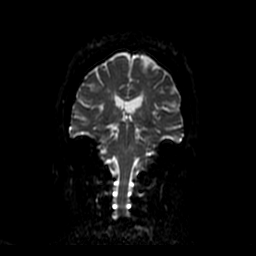
[im 66/66]
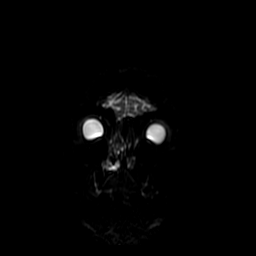

[Series 6: T1 · sagittal · 5.0mm · 0.47mm/px · 2 of 22 slices shown]
[im 1/22]
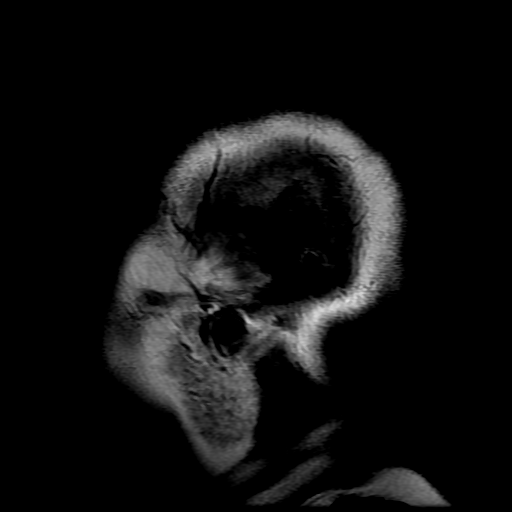
[im 22/22]
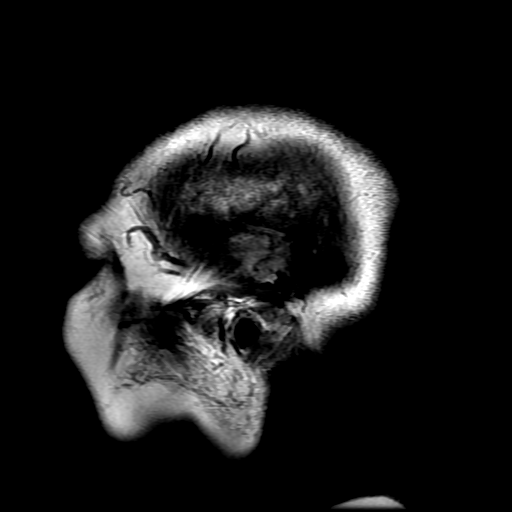

[Series 7: T2 · axial · 5.0mm · 0.43mm/px · z∈[-27,+110]mm · 2 of 24 slices shown (1 of 2)]
[im 1/24]
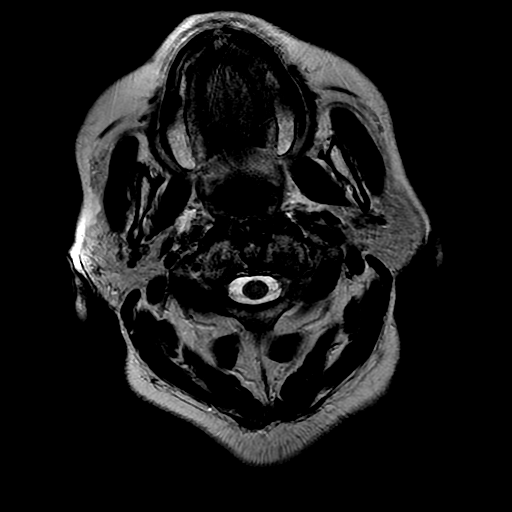
[im 24/24]
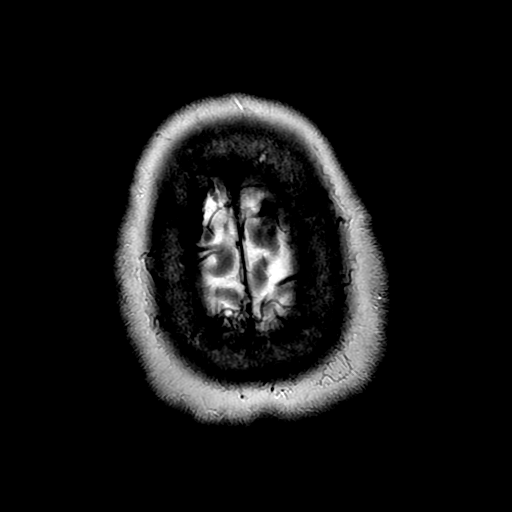

[Series 8: FLAIR · axial · 5.0mm · 0.43mm/px · z∈[-27,+110]mm · 2 of 24 slices shown]
[im 1/24]
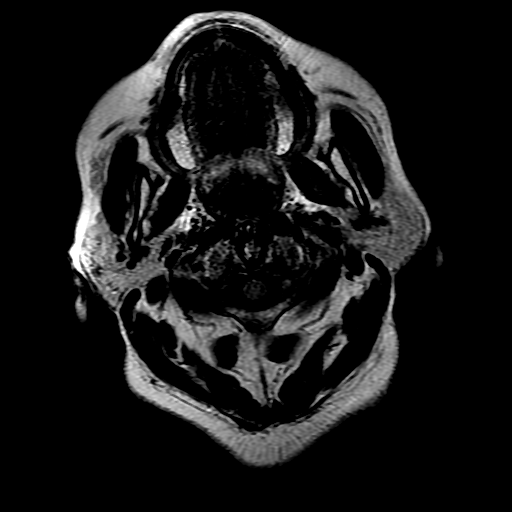
[im 24/24]
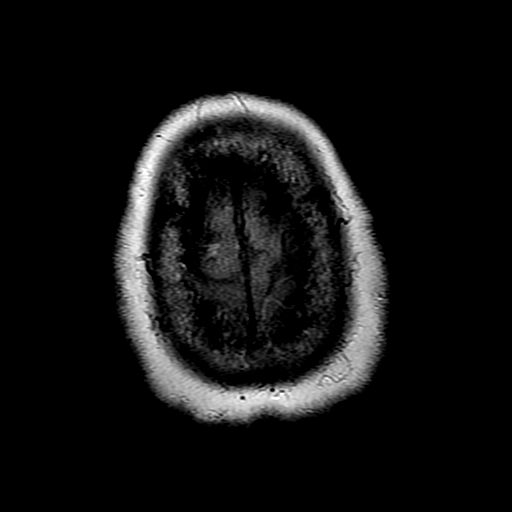

[Series 11: T2 · coronal · 5.0mm · 0.39mm/px · 2 of 25 slices shown (2 of 2)]
[im 1/25]
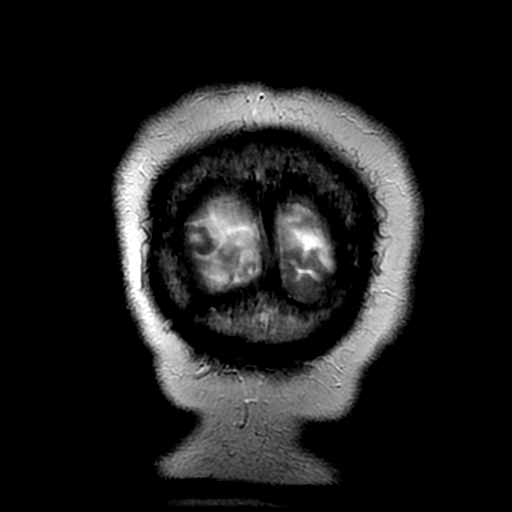
[im 25/25]
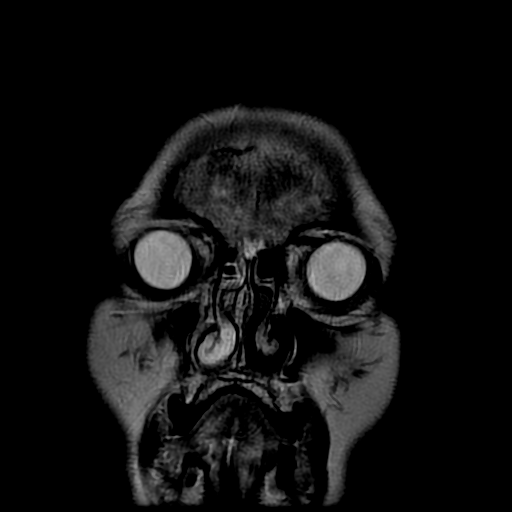

[Series 300: DWI · axial · 3.0mm · 1.09mm/px · z∈[-44,+100]mm · 4 of 49 slices shown (3 of 4)]
[im 1/49]
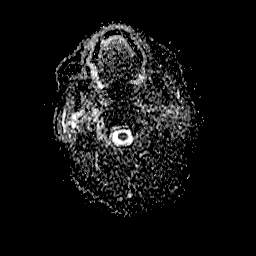
[im 17/49]
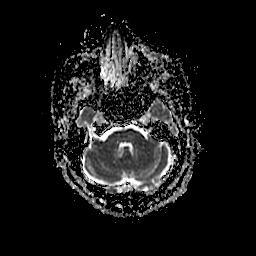
[im 33/49]
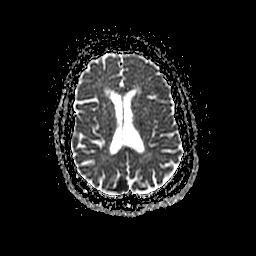
[im 49/49]
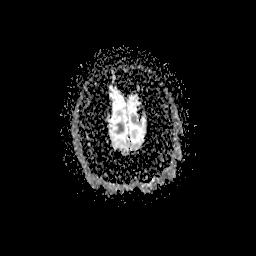

[Series 500: DWI · coronal · 5.0mm · 1.09mm/px · 2 of 33 slices shown (4 of 4)]
[im 1/33]
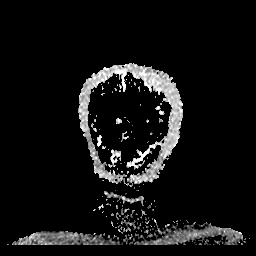
[im 33/33]
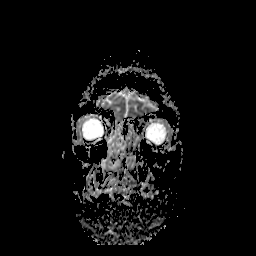

[30 of 48 positions shown; findings below may reference images not displayed]

FINDINGS: MRI HEAD FINDINGS

An acute nonhemorrhagic infarct is noted within the posterior right
occipital and anterior right temporal lobe. Additional areas of
acute nonhemorrhagic infarction are present within the anterior
right frontal lobe. These correspond with the areas of
hypoattenuation on CT, some of which were felt to be chronic. T2
changes are associated with the areas of acute infarction.

A remote lacunar infarct involves the right caudate head and
lentiform nucleus. Additional smaller remote lacunar infarcts are
present within the basal ganglia and corona radiata bilaterally.
Extensive periventricular and subcortical white matter changes are
noted. White matter disease extends into the brainstem.

Flow is present in the major intracranial arteries. Bilateral lens
replacements are noted. A polyp or mucous retention cyst is present
in the inferior left maxillary sinus. The remaining paranasal
sinuses and the mastoid air cells are clear. The skullbase is
unremarkable. Midline structures are within normal limits.

MRA HEAD FINDINGS

Atherosclerotic changes are present within the cavernous internal
carotid arteries bilaterally a 50% stenosis is present in the
precavernous left internal carotid artery. There is no significant
stenosis of greater than 50% in the right internal carotid artery.
There is some irregularity within the left M1 segment and bilateral
A1 segments. The anterior communicating artery is not definitively
seen. There is a high-grade stenosis of the distal right M1 segment
and a tandem more distal stenosis in the a proximal right M2 branch.
There is moderate attenuation of more much more distal left MCA
branches.

The vertebral arteries are small bilaterally. The left PICA origin
is visualized and normal. The right PICA is not visualized. The
vertebrobasilar junction is within normal limits. There is mild
narrowing of the distal basilar artery, less than 50%. A high-grade
stenosis is present and and proximal left P1 segment. The right P2
segment is occluded. There is attenuation of distal left PCA branch
vessels.
IMPRESSION: 1. Acute nonhemorrhagic infarcts involving the right occipital lobe
and posterior inferior right temporal lobe.
2. The right P2 segment occlusion is associated with these infarcts.
3. Acute nonhemorrhagic infarcts involving the anterior right
frontal lobe.
4. High-grade tandem stenoses of the distal right M1 segment and
associated proximal right M2 segment.
5. Age advanced atrophy and diffuse white matter disease.
6. This corresponds with otherwise seen moderate distal small vessel
disease.

## 2016-01-09 IMAGING — CR DG CHEST 2V
2 series · 2 of 2 positions shown · non-contrast
Comparison: 08/18/2014

CLINICAL DATA: Altered mental status, possible lung nodule

EXAM:
CHEST  2 VIEW

[w chest lat]
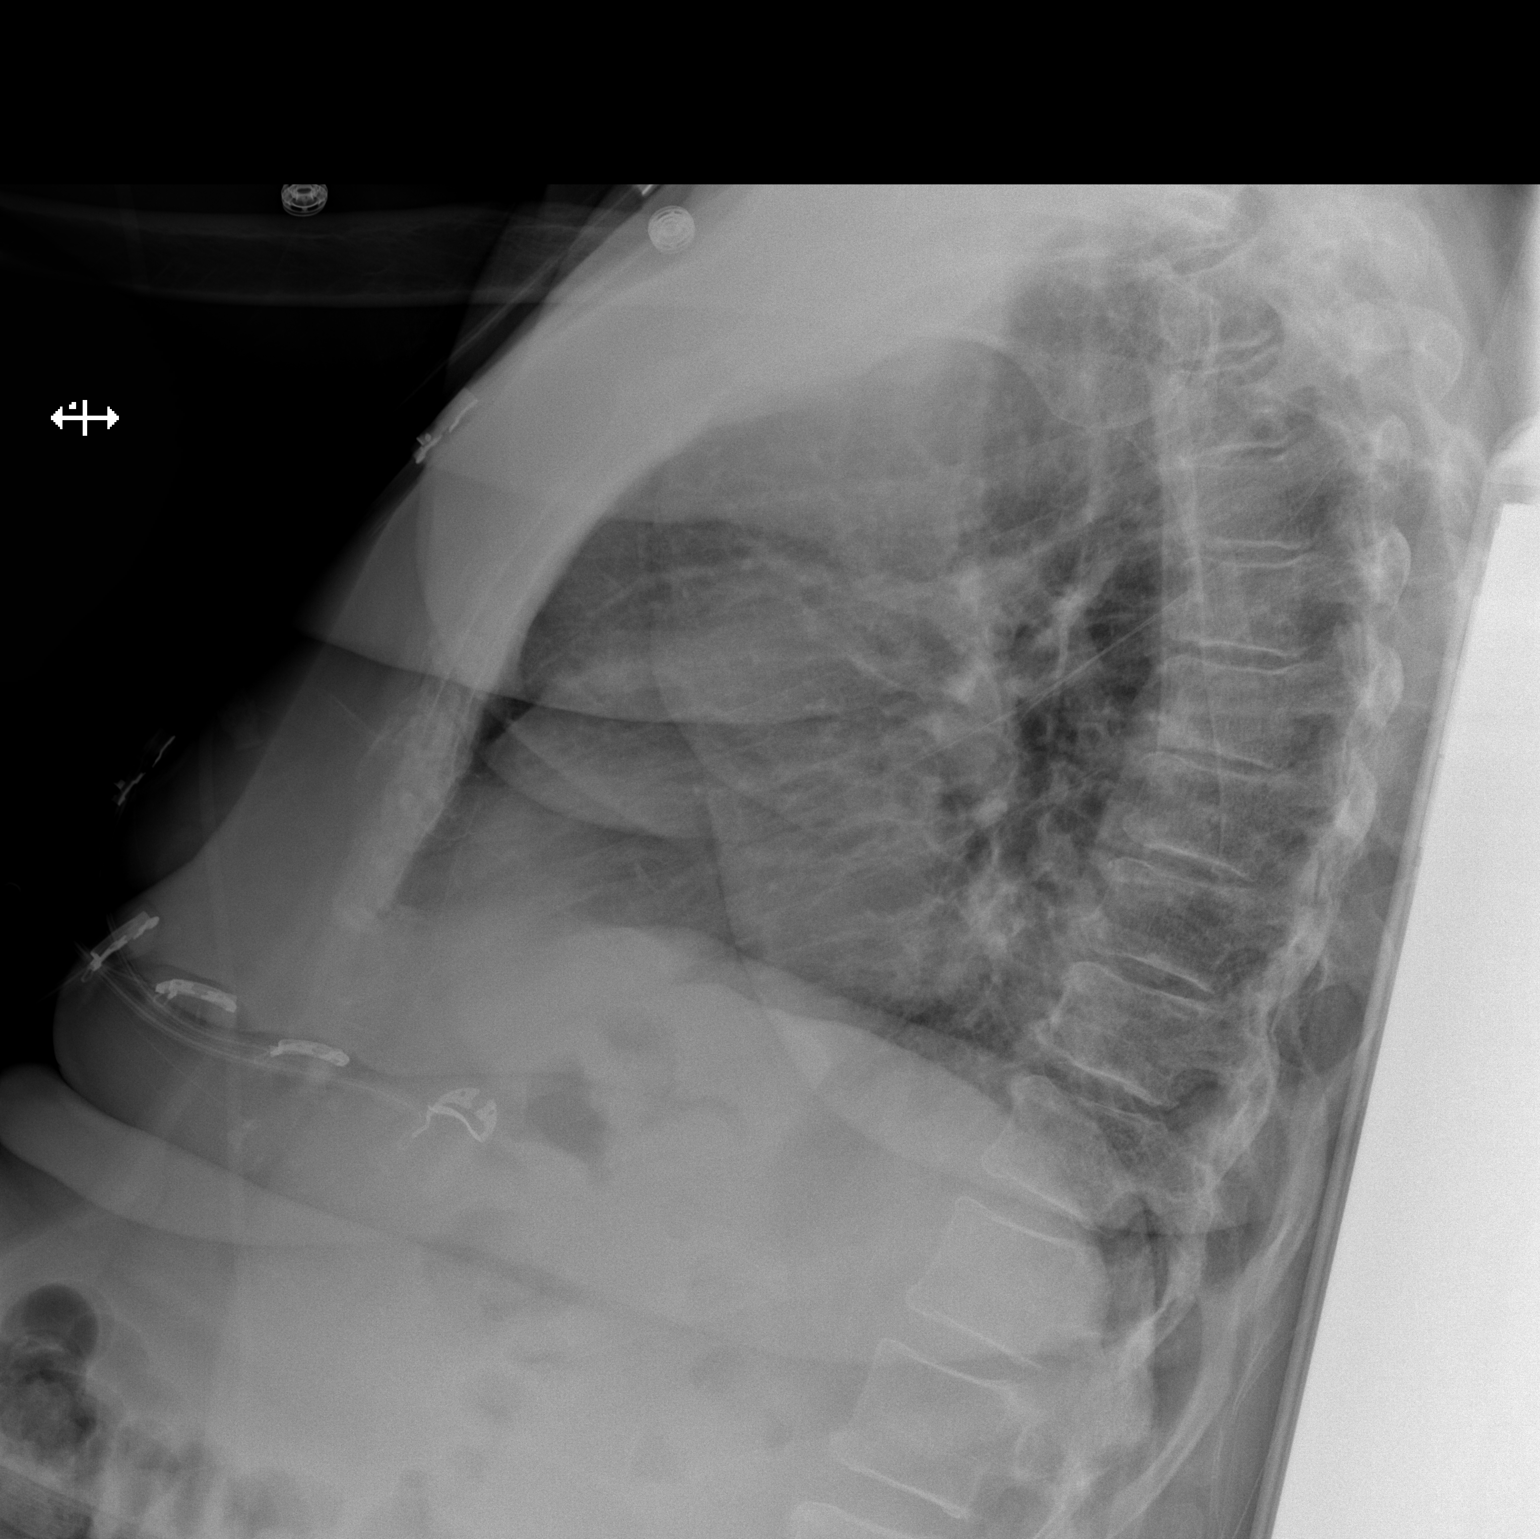

[x chest ap]
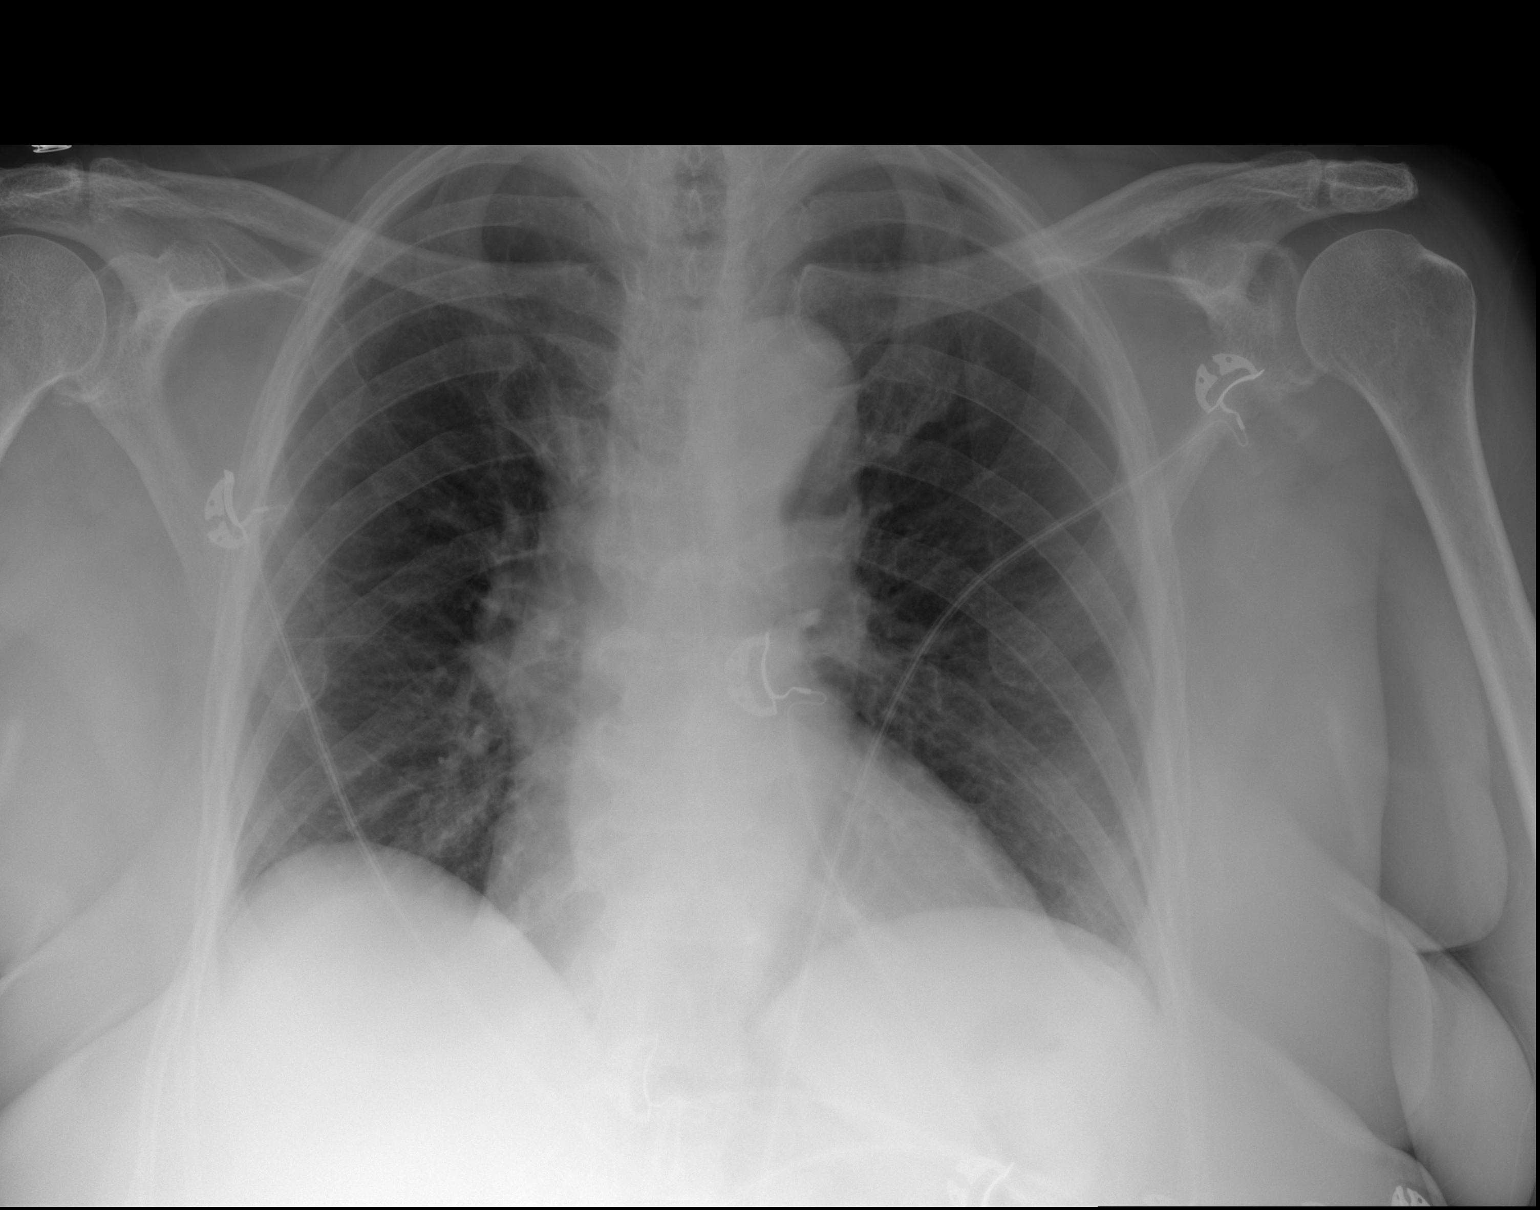

[2 of 2 positions shown; findings below may reference images not displayed]

FINDINGS: Cardiac shadow is within normal limits. The lungs are well aerated
bilaterally. The previously seen nodular density is less well
appreciated on the current exam. No new focal infiltrate is seen. No
new bony abnormality is noted.
IMPRESSION: No acute abnormality noted. The previously seen nodular density in
the left base is not well appreciated on this exam.

## 2016-01-22 LAB — CUP PACEART REMOTE DEVICE CHECK: MDC IDC SESS DTM: 20170708181059

## 2016-01-22 NOTE — Progress Notes (Signed)
Carelink summary report received. Battery status OK. Normal device function. No new symptom episodes, tachy episodes, brady, or pause episodes. No new AF episodes. Monthly summary reports and ROV/PRN 

## 2016-01-26 ENCOUNTER — Ambulatory Visit (INDEPENDENT_AMBULATORY_CARE_PROVIDER_SITE_OTHER): Payer: Medicare HMO | Admitting: *Deleted

## 2016-01-26 DIAGNOSIS — I638 Other cerebral infarction: Secondary | ICD-10-CM

## 2016-01-26 DIAGNOSIS — I6389 Other cerebral infarction: Secondary | ICD-10-CM

## 2016-01-26 NOTE — Progress Notes (Signed)
Carelink Summary Report / Loop Recorder 

## 2016-01-31 DIAGNOSIS — I10 Essential (primary) hypertension: Secondary | ICD-10-CM | POA: Diagnosis not present

## 2016-01-31 DIAGNOSIS — I25118 Atherosclerotic heart disease of native coronary artery with other forms of angina pectoris: Secondary | ICD-10-CM | POA: Diagnosis not present

## 2016-01-31 DIAGNOSIS — E785 Hyperlipidemia, unspecified: Secondary | ICD-10-CM | POA: Diagnosis not present

## 2016-01-31 DIAGNOSIS — E119 Type 2 diabetes mellitus without complications: Secondary | ICD-10-CM | POA: Diagnosis not present

## 2016-01-31 DIAGNOSIS — I639 Cerebral infarction, unspecified: Secondary | ICD-10-CM | POA: Diagnosis not present

## 2016-02-18 LAB — CUP PACEART REMOTE DEVICE CHECK: Date Time Interrogation Session: 20170807183511

## 2016-02-18 NOTE — Progress Notes (Signed)
Carelink summary report received. Battery status OK. Normal device function. No new symptom episodes, tachy episodes, brady, or pause episodes. No new AF episodes. Monthly summary reports and ROV/PRN 

## 2016-02-25 ENCOUNTER — Other Ambulatory Visit (INDEPENDENT_AMBULATORY_CARE_PROVIDER_SITE_OTHER): Payer: Medicare HMO

## 2016-02-25 ENCOUNTER — Encounter: Payer: Self-pay | Admitting: Internal Medicine

## 2016-02-25 ENCOUNTER — Ambulatory Visit (INDEPENDENT_AMBULATORY_CARE_PROVIDER_SITE_OTHER): Payer: Medicare HMO | Admitting: *Deleted

## 2016-02-25 ENCOUNTER — Ambulatory Visit (INDEPENDENT_AMBULATORY_CARE_PROVIDER_SITE_OTHER): Payer: Medicare HMO | Admitting: Internal Medicine

## 2016-02-25 VITALS — BP 120/70 | HR 79 | Temp 98.4°F | Resp 16 | Ht 66.5 in | Wt 177.1 lb

## 2016-02-25 DIAGNOSIS — R413 Other amnesia: Secondary | ICD-10-CM

## 2016-02-25 DIAGNOSIS — I638 Other cerebral infarction: Secondary | ICD-10-CM

## 2016-02-25 DIAGNOSIS — I6389 Other cerebral infarction: Secondary | ICD-10-CM

## 2016-02-25 DIAGNOSIS — K921 Melena: Secondary | ICD-10-CM | POA: Diagnosis not present

## 2016-02-25 DIAGNOSIS — T7840XA Allergy, unspecified, initial encounter: Secondary | ICD-10-CM

## 2016-02-25 DIAGNOSIS — I63511 Cerebral infarction due to unspecified occlusion or stenosis of right middle cerebral artery: Secondary | ICD-10-CM

## 2016-02-25 LAB — COMPREHENSIVE METABOLIC PANEL
ALT: 8 U/L (ref 0–35)
AST: 10 U/L (ref 0–37)
Albumin: 3.8 g/dL (ref 3.5–5.2)
Alkaline Phosphatase: 62 U/L (ref 39–117)
BUN: 22 mg/dL (ref 6–23)
CHLORIDE: 108 meq/L (ref 96–112)
CO2: 26 meq/L (ref 19–32)
Calcium: 8.8 mg/dL (ref 8.4–10.5)
Creatinine, Ser: 1.37 mg/dL — ABNORMAL HIGH (ref 0.40–1.20)
GFR: 48.16 mL/min — ABNORMAL LOW (ref >60.00)
GLUCOSE: 162 mg/dL — AB (ref 70–99)
POTASSIUM: 3.9 meq/L (ref 3.5–5.1)
SODIUM: 143 meq/L (ref 135–145)
Total Bilirubin: 0.4 mg/dL (ref 0.2–1.2)
Total Protein: 6.6 g/dL (ref 6.0–8.3)

## 2016-02-25 LAB — URINALYSIS, ROUTINE W REFLEX MICROSCOPIC
Bilirubin Urine: NEGATIVE
Hgb urine dipstick: NEGATIVE
Ketones, ur: NEGATIVE
Nitrite: POSITIVE — AB
PH: 5.5 (ref 5.0–8.0)
RBC / HPF: NONE SEEN (ref 0–?)
SPECIFIC GRAVITY, URINE: 1.025 (ref 1.000–1.030)
Total Protein, Urine: 30 — AB
URINE GLUCOSE: NEGATIVE
UROBILINOGEN UA: 0.2 (ref 0.0–1.0)

## 2016-02-25 LAB — HEMOGLOBIN A1C: HEMOGLOBIN A1C: 6.7 % — AB (ref 4.6–6.5)

## 2016-02-25 LAB — TSH: TSH: 1.36 u[IU]/mL (ref 0.35–4.50)

## 2016-02-25 MED ORDER — PREDNISONE 20 MG PO TABS: 20.0000 mg | ORAL_TABLET | Freq: Every day | ORAL | 0 refills | Status: DC

## 2016-02-25 NOTE — Progress Notes (Signed)
Pre visit review using our clinic review tool, if applicable. No additional management support is needed unless otherwise documented below in the visit note. 

## 2016-02-25 NOTE — Progress Notes (Signed)
Subjective:    Patient ID: Kayla [name], female    DOB: 29-Jun-1940, 75 y.o.   MRN: [id number]  HPI The patient is a 75 YO female coming in for follow up visit with several concerns. She was late to the visit however due to the acuity of the conditions we still addressed all her concerns. She is having welts on her face (started on Monday of this week). She has been eating some foods which she was allergic to which she has eaten in the past without this reaction. She is taking benadryl with mild relief. Denies mouth or throat swelling, SOB, face with swelling. She ate some fruits as well as fish and both of these she has allergies to. No medication changes or dose adjustments. No new products or soaps. She denies new makeup.  Next concern is some face drooping. Her son noticed it last week and did not share with her daughter. She found out this weekend and scheduled this visit. She has had stroke in the recent past. Now the drooping is gone and the patient herself did not notice it. Her son also thought her speech was altered but they did not seek care. This is not present today. She is still having some change in sensation on the left side arm and leg from her recent stroke. She is taking her medicines as prescribed.  Next concern we are not able to address well but they bring up is that she had an episode of blood in her stool last week. She was straining to move her bowels and there was some blood noted. They also did not seek care at that time. This has not happened since. Denies SOB or chest pains or low energy.  They also are concerned about her memory but we are not able to address this at all.   Review of Systems  Constitutional: Negative for activity change, appetite change, fatigue, fever and unexpected weight change.  HENT: Positive for hearing loss.   Respiratory: Negative for chest tightness and shortness of breath.   Cardiovascular: Negative for chest pain, palpitations and leg  swelling.  Gastrointestinal: Positive for blood in stool. Negative for abdominal distention, abdominal pain, diarrhea, nausea and vomiting.  Musculoskeletal: Negative.   Skin: Positive for color change and rash. Negative for pallor and wound.  Neurological: Positive for facial asymmetry, weakness and numbness. Negative for dizziness, seizures, syncope, light-headedness and headaches.       Stable since prior exam  Psychiatric/Behavioral: Negative for confusion, decreased concentration, sleep disturbance and suicidal ideas. The patient is not nervous/anxious.       Objective:   Physical Exam  Constitutional: She is oriented to person, place, and time. She appears well-developed and well-nourished.  HENT:  Head: Normocephalic and atraumatic.  Eyes: EOM are normal.  Neck: Normal range of motion.  Cardiovascular: Normal rate and regular rhythm.   Pulmonary/Chest: Effort normal. No respiratory distress. She has no wheezes.  Abdominal: Soft. Bowel sounds are normal. She exhibits no distension. There is no tenderness. There is no rebound.  Neurological: She is alert and oriented to person, place, and time. Coordination abnormal.  Still some change in sensation right to left. No facial drooping or voice change on exam today, CN 2-10 intact  Skin: Skin is warm and dry.  Several welts on her face right sided and neck with 1 on the left forehead, itchy but not tender to touch, no signs of infection.   Vitals:   02/25/16 [id number]  BP: 120/70  Pulse: 79  Resp: 16  Temp: 98.4 F (36.9 C)  TempSrc: Oral  SpO2: 98%  Weight: 177 lb 1.9 oz (80.3 kg)  Height: 5' 6.5" (1.689 m)      Assessment & Plan:

## 2016-02-25 NOTE — Patient Instructions (Signed)
We have sent in the prednisone for your face. Take 1 pill daily for 5 days then stop.  We recommend for you to take the zyrtec again daily, starting today.   We are checking the labs today and will call you back about the results.

## 2016-02-26 ENCOUNTER — Encounter: Payer: Self-pay | Admitting: Internal Medicine

## 2016-02-26 DIAGNOSIS — K921 Melena: Secondary | ICD-10-CM | POA: Insufficient documentation

## 2016-02-26 DIAGNOSIS — T7840XA Allergy, unspecified, initial encounter: Secondary | ICD-10-CM | POA: Insufficient documentation

## 2016-02-26 LAB — VITAMIN B12: VITAMIN B 12: 228 pg/mL (ref 211–911)

## 2016-02-26 NOTE — Assessment & Plan Note (Signed)
Given prednisone for the reaction. They are asked to avoid foods she is known to be allergic to. She is also asked to keep a food diary for any further reactions. She is asked to start taking zyrtec as well and benadryl as needed.

## 2016-02-26 NOTE — Progress Notes (Signed)
Carelink Summary Report / Loop Recorder 

## 2016-02-26 NOTE — Assessment & Plan Note (Signed)
Do not notice any facial drooping today on exam or speech changes. Advised that she needs to seek care immediately if she notices any new stroke symptoms.

## 2016-02-26 NOTE — Assessment & Plan Note (Signed)
Checking CBC, unable to address fully today and they were asked to return for further assessment.

## 2016-02-27 ENCOUNTER — Other Ambulatory Visit: Payer: Self-pay | Admitting: Internal Medicine

## 2016-02-27 MED ORDER — NITROFURANTOIN MONOHYD MACRO 100 MG PO CAPS: 100.0000 mg | ORAL_CAPSULE | Freq: Two times a day (BID) | ORAL | 0 refills | Status: DC

## 2016-03-11 ENCOUNTER — Telehealth: Payer: Self-pay | Admitting: Emergency Medicine

## 2016-03-11 NOTE — Telephone Encounter (Signed)
Patient aware and will zyrtec and flonase and will call us back if she still feels bad.

## 2016-03-11 NOTE — Telephone Encounter (Signed)
Pt called to check on this request. Offer to make an appt but pt wants to wait to hear from Dr. Sharlet Salina.

## 2016-03-11 NOTE — Telephone Encounter (Signed)
Pts daughter called and stated her mother has a bad cough and wants to know what you would recommend giving her. Does she need an office visit? Please advise thanks.

## 2016-03-11 NOTE — Telephone Encounter (Signed)
I'm not sure what a bad cold symptoms are so she probably should be seen. If she is having more drainage and allergy symptoms they can try zyrtec or flonase to see if this helps (these would help in 2-3 days) and they can try otc cold medicines which are safe for blood pressure (they should ask the pharmacist).

## 2016-03-26 ENCOUNTER — Ambulatory Visit (INDEPENDENT_AMBULATORY_CARE_PROVIDER_SITE_OTHER): Payer: Medicare HMO | Admitting: *Deleted

## 2016-03-26 DIAGNOSIS — I638 Other cerebral infarction: Secondary | ICD-10-CM

## 2016-03-26 DIAGNOSIS — I6389 Other cerebral infarction: Secondary | ICD-10-CM

## 2016-03-26 NOTE — Progress Notes (Signed)
Carelink Summary Report / Loop Recorder 

## 2016-03-27 LAB — CUP PACEART REMOTE DEVICE CHECK: MDC IDC SESS DTM: 20171006183901

## 2016-03-27 NOTE — Progress Notes (Signed)
Carelink summary report received. Battery status OK. Normal device function. No new symptom episodes, tachy episodes, brady, or pause episodes. No new AF episodes. Monthly summary reports and ROV/PRN 

## 2016-04-22 ENCOUNTER — Telehealth: Payer: Self-pay | Admitting: Internal Medicine

## 2016-04-22 DIAGNOSIS — Z8673 Personal history of transient ischemic attack (TIA), and cerebral infarction without residual deficits: Secondary | ICD-10-CM

## 2016-04-22 NOTE — Telephone Encounter (Signed)
Patient states she needs PT b/c left side is weak and she is stumbling.

## 2016-04-23 NOTE — Telephone Encounter (Signed)
Order placed for neuro rehab they will contact her.

## 2016-04-26 ENCOUNTER — Ambulatory Visit (INDEPENDENT_AMBULATORY_CARE_PROVIDER_SITE_OTHER): Payer: Medicare HMO | Admitting: *Deleted

## 2016-04-26 DIAGNOSIS — I6389 Other cerebral infarction: Secondary | ICD-10-CM

## 2016-04-26 DIAGNOSIS — I638 Other cerebral infarction: Secondary | ICD-10-CM

## 2016-04-27 NOTE — Progress Notes (Signed)
Carelink Summary Report / Loop Recorder 

## 2016-04-28 ENCOUNTER — Other Ambulatory Visit: Payer: Self-pay | Admitting: Internal Medicine

## 2016-05-18 NOTE — Telephone Encounter (Signed)
The referral put in was for Neurology. Can you put a new one in for neuro rehab, please.

## 2016-05-18 NOTE — Telephone Encounter (Signed)
No, referral put in correctly.

## 2016-05-25 ENCOUNTER — Ambulatory Visit (INDEPENDENT_AMBULATORY_CARE_PROVIDER_SITE_OTHER): Payer: Medicare HMO | Admitting: *Deleted

## 2016-05-25 DIAGNOSIS — I638 Other cerebral infarction: Secondary | ICD-10-CM

## 2016-05-25 DIAGNOSIS — I6389 Other cerebral infarction: Secondary | ICD-10-CM

## 2016-05-26 NOTE — Progress Notes (Signed)
Carelink Summary Report / Loop Recorder 

## 2016-05-27 ENCOUNTER — Other Ambulatory Visit: Payer: Self-pay | Admitting: Internal Medicine

## 2016-05-28 ENCOUNTER — Encounter: Payer: Self-pay | Admitting: Rehabilitation

## 2016-05-28 ENCOUNTER — Ambulatory Visit: Payer: Medicare HMO | Attending: Internal Medicine | Admitting: Rehabilitation

## 2016-05-28 DIAGNOSIS — I69354 Hemiplegia and hemiparesis following cerebral infarction affecting left non-dominant side: Secondary | ICD-10-CM | POA: Diagnosis present

## 2016-05-28 DIAGNOSIS — R2689 Other abnormalities of gait and mobility: Secondary | ICD-10-CM

## 2016-05-28 DIAGNOSIS — R2681 Unsteadiness on feet: Secondary | ICD-10-CM

## 2016-05-28 DIAGNOSIS — M6281 Muscle weakness (generalized): Secondary | ICD-10-CM

## 2016-05-29 NOTE — Therapy (Signed)
[place] [address] [place], [place], [address] Phone: [phone]   Fax:  [phone]  Physical Therapy Evaluation  Patient Details  Name: Kayla [name] MRN: [id number] Date of Birth: [birth year] Referring Provider: Pricilla Holm, MD  Encounter Date: 05/28/2016      PT End of Session - 05/29/16 0715    Visit Number 1   Number of Visits 7   Date for PT Re-Evaluation 07/13/16   Authorization Type AET[employer] MCR Gcode on every 10th visit   PT Start Time 1148   PT Stop Time 1231   PT Time Calculation (min) 43 min   Activity Tolerance Patient tolerated treatment well   Behavior During Therapy [facility] for tasks assessed/performed      Past Medical History:  Diagnosis Date  . Allergy    Shell fish  . Anxiety   . Arthritis    "knees, back, probably left hand" (01/14/2015)  . Asthmatic bronchitis   . Blind left eye   . Constipation   . Coronary artery disease   . Depression   . Diverticulosis of colon   . DJD (degenerative joint disease)   . Fibromyalgia   . GERD (gastroesophageal reflux disease)   . Heart murmur   . History of gout   . Hypercholesteremia   . Hypertension   . Personal history of noncompliance with medical treatment, presenting hazards to health   . Prolapse of vaginal vault after hysterectomy   . Proteinuria   . Sickle-cell trait ([place])   . Somatic dysfunction   . Stroke [facility]) ~ 08/2014   "blind in left eye; weak on left side since" (01/14/2015)  . Type II diabetes mellitus ([place])   . Venous insufficiency     Past Surgical History:  Procedure Laterality Date  . ABDOMINAL HYSTERECTOMY    . CARDIAC CATHETERIZATION N/A 01/14/2015   Procedure: Left Heart Cath and Coronary Angiography;  Surgeon: Charolette Forward, MD;  Location: [place] [facility];  Service: Cardiovascular;  Laterality: N/A;  . CARDIAC CATHETERIZATION  ~ 2011  . CATARACT EXTRACTION W/ INTRAOCULAR LENS  IMPLANT, BILATERAL Bilateral  2012  . CORONARY ANGIOPLASTY    . DILATION AND CURETTAGE OF UTERUS  "probably"  . LOOP RECORDER IMPLANT N/A 09/03/2014   Procedure: LOOP RECORDER IMPLANT;  Surgeon: Thompson Grayer, MD;  Location: [facility] CATH LAB;  Service: Cardiovascular;  Laterality: N/A;  . ROBOTIC ASSISTED LAPAROSCOPIC SACROCOLPOPEXY  09/2009   Dr. Matilde Sprang  . TEE WITHOUT CARDIOVERSION N/A 09/02/2014   Procedure: TRANSESOPHAGEAL ECHOCARDIOGRAM (TEE);  Surgeon: Lelon Perla, MD;  Location: [facility] ENDOSCOPY;  Service: Cardiovascular;  Laterality: N/A;    There were no vitals filed for this visit.       Subjective Assessment - 05/28/16 1159    Subjective "I'm not walking as well as they think I should.  I fell once.  My balance is still off."    Patient is accompained by: Family member  [employer] activities;Walking   Patient Stated Goals "to walk better."    Currently in Pain? Yes   Pain Score 5    Pain Location Knee   Pain Orientation Left   Pain Descriptors / Indicators Sore   Pain Type Acute pain   Pain Onset 1 to 4 weeks ago   Pain Frequency Intermittent   Aggravating Factors  walking, doing stairs   Pain Relieving Factors rest            [facility] PT  Assessment - 05/28/16 1202      Assessment   Medical Diagnosis old CVA   Referring Provider Pricilla Holm, MD   Onset Date/Surgical Date --  notices decreased balance over last 2 months   Prior Therapy OP therapy a little over a year ago     Precautions   Precautions Fall     Restrictions   Weight Bearing Restrictions No     Balance Screen   Has the patient fallen in the past 6 months Yes   How many times? 1   Has the patient had a decrease in activity level because of a fear of falling?  Yes   Is the patient reluctant to leave their home because of a fear of falling?  Yes     [place] Private residence   Living Arrangements Spouse/significant other   Available Help at Discharge  Family;Available 24 hours/day   Type of Home House   Home Access Stairs to enter   Entrance Stairs-Number of Steps 2   Entrance Stairs-Rails Can reach both   Home Layout One level   [place] - single point;Shower seat;Grab bars - tub/shower  walk in      Prior Function   Level of Independence Needs assistance with ADLs;Needs assistance with homemaking  Assist for bathing, mild dressing   Leisure watch TV, play music     Cognition   Overall Cognitive Status History of cognitive impairments - at baseline   Executive Function Initiating   Initiating Impaired   Initiating Impairment Functional basic   Behaviors Poor frustration tolerance  per family report     Sensation   Light Touch Impaired Detail   Light Touch Impaired Details Impaired LLE   Hot/Cold Appears Intact   Proprioception Appears Intact     Coordination   Gross Motor Movements are Fluid and Coordinated Yes   Fine Motor Movements are Fluid and Coordinated Yes     Tone   Assessment Location Left Lower Extremity     ROM / Strength   AROM / PROM / Strength Strength     Strength   Overall Strength Deficits   Overall Strength Comments L hip flex 2-/5, L knee ext 3+/5 (with pain), L knee flex 2+/5, L ankle DF 2+/5, L ankle PF 3/5, R hip flex 3+/5, all others 4/5     Transfers   Transfers Sit to Stand;Stand to Sit   Sit to Stand 5: Supervision   Sit to Stand Details Verbal cues for sequencing;Verbal cues for technique;Verbal cues for precautions/safety   Five time sit to stand comments  48.59 secs with BUE   Stand to Sit 5: Supervision   Stand to Sit Details (indicate cue type and reason) Verbal cues for sequencing;Verbal cues for technique;Verbal cues for precautions/safety     Ambulation/Gait   Ambulation/Gait Yes   Ambulation/Gait Assistance 5: Supervision;4: Min guard   Ambulation/Gait Assistance Details Pt requires cues to switch hands for [facility] to allow improved stability.  Intermittent min/guard  for safety.     Ambulation Distance (Feet) 115 Feet   Assistive device Straight cane   Gait Pattern Step-through pattern;Decreased hip/knee flexion - left;Decreased weight shift to left;Lateral hip instability;Trunk flexed;Poor foot clearance - left   Ambulation Surface Level;Indoor   Gait velocity 1.53 ft/sec with SPC   Stairs Yes   Stairs Assistance 4: Min assist   Stairs Assistance Details (indicate cue type and reason) cues for sequencing and safety with min A  to maintain stability.    Stair Management Technique One rail Left;Step to pattern;Forwards;With cane   Number of Stairs 4   Height of Stairs 6     Balance   Balance Assessed Yes     Static Standing Balance   Static Standing - Balance Support No upper extremity supported   Static Standing - Level of Assistance 4: Min assist   Static Standing Balance -  Activities  Tandam Stance - Left Leg  semi tandem, feet together, feet apart EC     LLE Tone   LLE Tone Within Functional Limits                           PT Education - 05/28/16 1438    Education provided Yes   Education Details Evaluation findings, POC, goals, the need to see carryover/improvement for continued therapy, using RW in community for increased safety   Person(s) Educated Patient;Spouse;Child(ren)   Methods Explanation   Comprehension Verbalized understanding          PT Short Term Goals - 05/29/16 0726      PT SHORT TERM GOAL #1   Title The patient will be able to perform HEP with assist from family. (Target Date: 06/18/16)   Time 3   Period Weeks   Status New     PT SHORT TERM GOAL #2   Title Will formally assess BERG and improve score by 3 points from baseline in order to indicate decreased fall risk.     Time 3   Period Weeks   Status New     PT SHORT TERM GOAL #3   Title Pt will perform 5TSS test in <38 secs with single UE support in order to indicate improved functional strength.     Time 3   Period Weeks   Status  New     PT SHORT TERM GOAL #4   Title Pt will improve gait speed to >1.8 ft/sec in order to indicate decreased fall risk and improved efficiency of gait.     Time 3   Period Weeks   Status New           PT Long Term Goals - 05/29/16 0731      PT LONG TERM GOAL #1   Title Pt will improve BERG score by 6 points from baseline in order to indicate decreaesd fall risk.  (Target Date: 07/10/15)   Time 6   Period Weeks   Status New     PT LONG TERM GOAL #2   Title Pt will perform 5TSS test in <28 secs with single UE support in order to indicate improved functional strength.      Time 6   Period Weeks   Status New     PT LONG TERM GOAL #3   Title Pt will improve gait speed to 2.4 ft/sec in order to indicate decreased fall risk and improved efficiency of gait.     Time 6   Period Weeks   Status New     PT LONG TERM GOAL #4   Title Pt will negotiate up/down 4 stairs with single handrail in reciprocal pattern at S level in order to indicate safe home entry/exit.     Time 6   Period Weeks   Status New               Plan - 05/29/16 0717    Clinical Impression Statement Pt presents with history of  CVA with L hemiparesis, poor cognition with decreased memory and initiation as well as L inattention/L field cut.  Pt and family note decreasing balance and one fall in the last six months.  Pt is familiar to this clinic and note decreased progress in the past due to poor carryover.  Upon PT evaluation note that gait speed is 1.53 ft/sec, indicative of fall risk, 5TSS at 48.59 secs with UE support and decreased balance with semi tandem stance, EC and feet together.  Had lengthy discussion with family regarding the need to see carryover with this bout of therapy in order to continue.  Also recommend that pt may be safer using RW when in community for increased support.  Pt and family verbalized understanding.  Pt is of evolving presentation and low complexity from PT POC standpoint.  Pt  will benefit from skilled OP neuro PT in order to address deficits.     Rehab Potential Fair   Clinical Impairments Affecting Rehab Potential poor cognition and carryover from CVA   PT Frequency 1x / week   PT Duration 6 weeks   PT Treatment/Interventions ADLs/Self Care Home Management;DME Instruction;Gait training;Stair training;Functional mobility training;Therapeutic activities;Therapeutic exercise;Balance training;Neuromuscular re-education;Patient/family education;Orthotic Fit/Training;Energy conservation;Vestibular   PT Next Visit Plan do formal BERG, est HEP for BLE strength (L>R), add balance to HEP as able, gait with RW for outdoor gait?, foot up brace for LLE?   Consulted and Agree with Plan of Care Patient;Family member/caregiver   Family Member Consulted husband and daughter      Patient will benefit from skilled therapeutic intervention in order to improve the following deficits and impairments:     Visit Diagnosis: Unsteadiness on feet - Plan: PT plan of care cert/re-cert  Muscle weakness (generalized) - Plan: PT plan of care cert/re-cert  Hemiplegia and hemiparesis following cerebral infarction affecting left non-dominant side ([place]) - Plan: PT plan of care cert/re-cert  Other abnormalities of gait and mobility - Plan: PT plan of care cert/re-cert      G-Codes - [id number]    Functional Assessment Tool Used Gait speed 1.53 ft/sec, 5TSS 48.59 secs with UE support   Functional Limitation Mobility: Walking and moving around   Mobility: Walking and Moving Around Current Status [id number]) At least 60 percent but less than 80 percent impaired, limited or restricted   Mobility: Walking and Moving Around Goal Status [id number]) At least 20 percent but less than 40 percent impaired, limited or restricted       Problem List Patient Active Problem List   Diagnosis Date Noted  . Allergic reaction 02/26/2016  . Blood in stool 02/26/2016  . Decreased strength, endurance, and  mobility 09/25/2015  . Adjustment disorder with depressed mood 07/27/2015  . Left homonymous hemianopsia 09/06/2014  . Occipital infarction ([place]) 09/05/2014  . Acute ischemic right MCA stroke ([place]) 08/30/2014  . Acute right PCA stroke ([place]) 08/30/2014  . Abnormal chest x-ray 08/22/2014  . Type 2 diabetes mellitus with diabetic neuropathy ([place]) 01/04/2014  . HYPERCHOLESTEROLEMIA, MILD 05/22/2009  . PERS HX NONCOMPLIANCE W/MED TX PRS HAZARDS HLTH 07/11/2007  . Essential hypertension 05/04/2007  . Osteoarthritis 05/04/2007  . Fibromyalgia 05/04/2007    Cameron Sprang, PT, MPT [facility] [address] [place], [place], [address] Phone: [phone]   Fax:  [phone] 05/29/16, 7:41 AM  Name: Kayla [name] MRN: [id number] Date of Birth: June 06, 1941

## 2016-06-02 ENCOUNTER — Ambulatory Visit: Payer: Medicare HMO | Admitting: Physical Therapy

## 2016-06-02 ENCOUNTER — Encounter: Payer: Self-pay | Admitting: Physical Therapy

## 2016-06-02 DIAGNOSIS — M6281 Muscle weakness (generalized): Secondary | ICD-10-CM

## 2016-06-02 DIAGNOSIS — I69354 Hemiplegia and hemiparesis following cerebral infarction affecting left non-dominant side: Secondary | ICD-10-CM

## 2016-06-02 DIAGNOSIS — R2689 Other abnormalities of gait and mobility: Secondary | ICD-10-CM

## 2016-06-02 DIAGNOSIS — R2681 Unsteadiness on feet: Secondary | ICD-10-CM | POA: Diagnosis not present

## 2016-06-02 NOTE — Patient Instructions (Addendum)
Bridge    Lie back, legs bent. Lift hips up off bed. Hold for 5 seconds.  Repeat 10_ times. Do _1-2_ sessions per day.  http://pm.exer.us/55   Copyright  VHI. All rights reserved.    Strengthening: Hip Abductor - Resisted    With band looped around both legs above knees, push thighs apart. Hold for 5 seconds. Repeat _10___ times per set. Do _1_ sets per session. Do _1-2_ sessions per day.  http://orth.exer.us/688   Copyright  VHI. All rights reserved.    Functional Quadriceps: Sit to Stand    Sit on edge of chair, feet flat on floor. Stand upright, straightening knees fully. Slowly sit back down, use arms as needed. Repeat _10_ times per set. Do _1_ sets per session. Do _1-2_ sessions per day.  http://orth.exer.us/735   Copyright  VHI. All rights reserved.

## 2016-06-02 NOTE — Therapy (Signed)
[facility] [address] [place], [place], [address] Phone: [phone]   Fax:  [phone]  Physical Therapy Treatment  Patient Details  Name: Kayla [name] MRN: [id number] Date of Birth: 15-Apr-1941 Referring Provider: Pricilla Holm, MD  Encounter Date: 06/02/2016      PT End of Session - 06/02/16 0939    Visit Number 2   Number of Visits 7   Date for PT Re-Evaluation 07/13/16   Authorization Type AET[employer] MCR Gcode on every 10th visit   PT Start Time 0932   PT Stop Time 1015   PT Time Calculation (min) 43 min   Equipment Utilized During Treatment Gait belt   Activity Tolerance Patient tolerated treatment well   Behavior During Therapy [facility] for tasks assessed/performed      Past Medical History:  Diagnosis Date  . Allergy    Shell fish  . Anxiety   . Arthritis    "knees, back, probably left hand" (01/14/2015)  . Asthmatic bronchitis   . Blind left eye   . Constipation   . Coronary artery disease   . Depression   . Diverticulosis of colon   . DJD (degenerative joint disease)   . Fibromyalgia   . GERD (gastroesophageal reflux disease)   . Heart murmur   . History of gout   . Hypercholesteremia   . Hypertension   . Personal history of noncompliance with medical treatment, presenting hazards to health   . Prolapse of vaginal vault after hysterectomy   . Proteinuria   . Sickle-cell trait ([place])   . Somatic dysfunction   . Stroke [facility]) ~ 08/2014   "blind in left eye; weak on left side since" (01/14/2015)  . Type II diabetes mellitus ([place])   . Venous insufficiency     Past Surgical History:  Procedure Laterality Date  . ABDOMINAL HYSTERECTOMY    . CARDIAC CATHETERIZATION N/A 01/14/2015   Procedure: Left Heart Cath and Coronary Angiography;  Surgeon: Charolette Forward, MD;  Location: [place] CV LAB;  Service: Cardiovascular;  Laterality: N/A;  . CARDIAC CATHETERIZATION  ~ 2011  . CATARACT EXTRACTION  W/ INTRAOCULAR LENS  IMPLANT, BILATERAL Bilateral 2012  . CORONARY ANGIOPLASTY    . DILATION AND CURETTAGE OF UTERUS  "probably"  . LOOP RECORDER IMPLANT N/A 09/03/2014   Procedure: LOOP RECORDER IMPLANT;  Surgeon: Thompson Grayer, MD;  Location: [facility] CATH LAB;  Service: Cardiovascular;  Laterality: N/A;  . ROBOTIC ASSISTED LAPAROSCOPIC SACROCOLPOPEXY  09/2009   Dr. Matilde Sprang  . TEE WITHOUT CARDIOVERSION N/A 09/02/2014   Procedure: TRANSESOPHAGEAL ECHOCARDIOGRAM (TEE);  Surgeon: Lelon Perla, MD;  Location: [facility] ENDOSCOPY;  Service: Cardiovascular;  Laterality: N/A;    There were no vitals filed for this visit.      Subjective Assessment - 06/02/16 0937    Subjective No new complaints. Pt and spouse report no falls. Pt denies any pain. To clinic today with cane. Reinforced PT recommendation for use of walker out of home.   Patient is accompained by: Family member   Limitations House hold activities;Walking   Patient Stated Goals "to walk better."    Currently in Pain? No/denies   Pain Score 0-No pain            [facility] PT Assessment - 06/02/16 0940      Berg Balance Test   Sit to Stand Able to stand  independently using hands  unstable when stood without UE support   Standing Unsupported Able to stand 2  minutes with supervision   Sitting with Back Unsupported but Feet Supported on Floor or Stool Able to sit safely and securely 2 minutes   Stand to Sit Controls descent by using hands   Transfers Able to transfer safely, definite need of hands   Standing Unsupported with Eyes Closed Able to stand 10 seconds with supervision   Standing Ubsupported with Feet Together Able to place feet together independently but unable to hold for 30 seconds   From Standing, Reach Forward with Outstretched Arm Can reach forward >12 cm safely (5")  8 inches   From Standing Position, Pick up Object from Floor Able to pick up shoe, needs supervision   From Standing Position, Turn to Look Behind Over each  Shoulder Turn sideways only but maintains balance   Turn 360 Degrees Able to turn 360 degrees safely but slowly  > 7 sec's both ways   Standing Unsupported, Alternately Place Feet on Step/Stool Able to complete 4 steps without aid or supervision  22.85 sec's   Standing Unsupported, One Foot in [place] to take small step independently and hold 30 seconds   Standing on One Leg Tries to lift leg/unable to hold 3 seconds but remains standing independently   Total Score 36   Berg comment: <36 = high risk for falls     issued the following to pt's HEP: Bridge    Lie back, legs bent. Lift hips up off bed. Hold for 5 seconds.  Repeat 10_ times. Do _1-2_ sessions per day.  http://pm.exer.us/55   Copyright  VHI. All rights reserved.    Strengthening: Hip Abductor - Resisted    With band looped around both legs above knees, push thighs apart. Hold for 5 seconds. Repeat _10___ times per set. Do _1_ sets per session. Do _1-2_ sessions per day.  http://orth.exer.us/688   Copyright  VHI. All rights reserved.    Functional Quadriceps: Sit to Stand    Sit on edge of chair, feet flat on floor. Stand upright, straightening knees fully. Slowly sit back down, use arms as needed. Repeat _10_ times per set. Do _1_ sets per session. Do _1-2_ sessions per day.  http://orth.exer.us/735   Copyright  VHI. All rights reserved.          PT Education - 06/02/16 1006    Education provided Yes   Education Details results of Berg Balance test and recommendation to use RW in community at this time; HEP for LE strengthening   Person(s) Educated Patient;Spouse   Methods Explanation;Demonstration;Verbal cues;Handout   Comprehension Verbalized understanding;Verbal cues required;Need further instruction          PT Short Term Goals - 06/02/16 1134      PT SHORT TERM GOAL #1   Title The patient will be able to perform HEP with assist from family. (Target Date: 06/18/16)   Time 3    Period Weeks   Status On-going     PT SHORT TERM GOAL #2   Title Will formally assess BERG and improve score by 3 points from baseline in order to indicate decreased fall risk.     Baseline 06/02/16: 36/56 baseline score for Berg Balance Test   Time 3   Period Weeks   Status On-going     PT SHORT TERM GOAL #3   Title Pt will perform 5TSS test in <38 secs with single UE support in order to indicate improved functional strength.     Time 3   Period Weeks   Status On-going  PT SHORT TERM GOAL #4   Title Pt will improve gait speed to >1.8 ft/sec in order to indicate decreased fall risk and improved efficiency of gait.     Time 3   Period Weeks   Status On-going           PT Long Term Goals - 05/29/16 0731      PT LONG TERM GOAL #1   Title Pt will improve BERG score by 6 points from baseline in order to indicate decreaesd fall risk.  (Target Date: 07/10/15)   Time 6   Period Weeks   Status New     PT LONG TERM GOAL #2   Title Pt will perform 5TSS test in <28 secs with single UE support in order to indicate improved functional strength.      Time 6   Period Weeks   Status New     PT LONG TERM GOAL #3   Title Pt will improve gait speed to 2.4 ft/sec in order to indicate decreased fall risk and improved efficiency of gait.     Time 6   Period Weeks   Status New     PT LONG TERM GOAL #4   Title Pt will negotiate up/down 4 stairs with single handrail in reciprocal pattern at S level in order to indicate safe home entry/exit.     Time 6   Period Weeks   Status New           Plan - 06/02/16 0939    Clinical Impression Statement today's skilled session focused on establishment of pt's baseline score on the Berg Balance Test. Remainder of session addressed establishement of HEP for strengthening. Limited HEP to 3 exercises at this time hoping this will allow pt to be more complaint with HEP at home and not overwhelmed with too many exercises. Pt is progressing towards  goals and should benefit from continued PT.                                 Rehab Potential Fair   Clinical Impairments Affecting Rehab Potential poor cognition and carryover from CVA   PT Frequency 1x / week   PT Duration 6 weeks   PT Treatment/Interventions ADLs/Self Care Home Management;DME Instruction;Gait training;Stair training;Functional mobility training;Therapeutic activities;Therapeutic exercise;Balance training;Neuromuscular re-education;Patient/family education;Orthotic Fit/Training;Energy conservation;Vestibular   PT Next Visit Plan continue to work on LE strengthening, add balance to HEP as able, gait with RW for outdoor gait?, foot up brace for LLE?   Consulted and Agree with Plan of Care Patient;Family member/caregiver   Family Member Consulted husband and daughter      Patient will benefit from skilled therapeutic intervention in order to improve the following deficits and impairments:     Visit Diagnosis: Unsteadiness on feet  Muscle weakness (generalized)  Other abnormalities of gait and mobility  Hemiplegia and hemiparesis following cerebral infarction affecting left non-dominant side [facility])     Problem List Patient Active Problem List   Diagnosis Date Noted  . Allergic reaction 02/26/2016  . Blood in stool 02/26/2016  . Decreased strength, endurance, and mobility 09/25/2015  . Adjustment disorder with depressed mood 07/27/2015  . Left homonymous hemianopsia 09/06/2014  . Occipital infarction ([place]) 09/05/2014  . Acute ischemic right MCA stroke ([place]) 08/30/2014  . Acute right PCA stroke ([place]) 08/30/2014  . Abnormal chest x-ray 08/22/2014  . Type 2 diabetes mellitus with diabetic neuropathy ([place]) 01/04/2014  .  HYPERCHOLESTEROLEMIA, MILD 05/22/2009  . PERS HX NONCOMPLIANCE W/MED TX PRS HAZARDS HLTH 07/11/2007  . Essential hypertension 05/04/2007  . Osteoarthritis 05/04/2007  . Fibromyalgia 05/04/2007    Willow Ora, PTA, [facility] Outpatient Neuro [facility] [address], [place], [place] [address] [phone] 06/02/16, 11:40 AM   Name: Kayla [name] MRN: [id number] Date of Birth: Jun 15, 1941

## 2016-06-10 LAB — CUP PACEART REMOTE DEVICE CHECK
MDC IDC PG IMPLANT DT: 20160315
MDC IDC SESS DTM: 20171105191131

## 2016-06-10 NOTE — Progress Notes (Signed)
Carelink summary report received. Battery status OK. Normal device function. No new symptom episodes, tachy episodes, brady, or pause episodes. No new AF episodes. Monthly summary reports and ROV/PRN 

## 2016-06-11 ENCOUNTER — Encounter: Payer: Self-pay | Admitting: Rehabilitation

## 2016-06-11 ENCOUNTER — Ambulatory Visit: Payer: Medicare HMO | Admitting: Rehabilitation

## 2016-06-11 DIAGNOSIS — R2689 Other abnormalities of gait and mobility: Secondary | ICD-10-CM

## 2016-06-11 DIAGNOSIS — R2681 Unsteadiness on feet: Secondary | ICD-10-CM

## 2016-06-11 DIAGNOSIS — I69354 Hemiplegia and hemiparesis following cerebral infarction affecting left non-dominant side: Secondary | ICD-10-CM

## 2016-06-11 DIAGNOSIS — M6281 Muscle weakness (generalized): Secondary | ICD-10-CM

## 2016-06-11 NOTE — Therapy (Signed)
[facility] [address] [place], [place], [address] Phone: [phone]   Fax:  [phone]  Physical Therapy Treatment  Patient Details  Name: Kayla [name] MRN: [id number] Date of Birth: 1941/01/06 Referring Provider: Pricilla Holm, MD  Encounter Date: 06/11/2016      PT End of Session - 06/11/16 1451    Visit Number 3   Number of Visits 7   Date for PT Re-Evaluation 07/13/16   Authorization Type AET[employer] MCR Gcode on every 10th visit   PT Start Time 1450   PT Stop Time 1535   PT Time Calculation (min) 45 min   Equipment Utilized During Treatment Gait belt   Activity Tolerance Patient tolerated treatment well   Behavior During Therapy [facility] for tasks assessed/performed      Past Medical History:  Diagnosis Date  . Allergy    Shell fish  . Anxiety   . Arthritis    "knees, back, probably left hand" (01/14/2015)  . Asthmatic bronchitis   . Blind left eye   . Constipation   . Coronary artery disease   . Depression   . Diverticulosis of colon   . DJD (degenerative joint disease)   . Fibromyalgia   . GERD (gastroesophageal reflux disease)   . Heart murmur   . History of gout   . Hypercholesteremia   . Hypertension   . Personal history of noncompliance with medical treatment, presenting hazards to health   . Prolapse of vaginal vault after hysterectomy   . Proteinuria   . Sickle-cell trait ([place])   . Somatic dysfunction   . Stroke [facility]) ~ 08/2014   "blind in left eye; weak on left side since" (01/14/2015)  . Type II diabetes mellitus ([place])   . Venous insufficiency     Past Surgical History:  Procedure Laterality Date  . ABDOMINAL HYSTERECTOMY    . CARDIAC CATHETERIZATION N/A 01/14/2015   Procedure: Left Heart Cath and Coronary Angiography;  Surgeon: Charolette Forward, MD;  Location: [place] CV LAB;  Service: Cardiovascular;  Laterality: N/A;  . CARDIAC CATHETERIZATION  ~ 2011  . CATARACT EXTRACTION  W/ INTRAOCULAR LENS  IMPLANT, BILATERAL Bilateral 2012  . CORONARY ANGIOPLASTY    . DILATION AND CURETTAGE OF UTERUS  "probably"  . LOOP RECORDER IMPLANT N/A 09/03/2014   Procedure: LOOP RECORDER IMPLANT;  Surgeon: Thompson Grayer, MD;  Location[facility] CATH LAB;  Service: Cardiovascular;  Laterality: N/A;  . ROBOTIC ASSISTED LAPAROSCOPIC SACROCOLPOPEXY  09/2009   Dr. Matilde Sprang  . TEE WITHOUT CARDIOVERSION N/A 09/02/2014   Procedure: TRANSESOPHAGEAL ECHOCARDIOGRAM (TEE);  Surgeon: Lelon Perla, MD;  Location: [facility] ENDOSCOPY;  Service: Cardiovascular;  Laterality: N/A;    There were no vitals filed for this visit.      Subjective Assessment - 06/11/16 1450    Subjective No new complaints since last visit, no falls.     Patient is accompained by: Family member   Limitations House hold activities;Walking   Patient Stated Goals "to walk better."    Currently in Pain? No/denies                         [facility] Adult PT Treatment/Exercise - 06/11/16 0001      Ambulation/Gait   Ambulation/Gait Yes   Ambulation/Gait Assistance 5: Supervision   Ambulation/Gait Assistance Details Continue to work on gait, ramp and curb with RW.  Note pt markedly safer with RW vs cane and feel that she is  also more efficient with RW.     Ambulation Distance (Feet) 115 Feet   Assistive device Rolling walker   Gait Pattern Step-through pattern;Decreased hip/knee flexion - left;Decreased weight shift to left;Lateral hip instability;Trunk flexed;Poor foot clearance - left   Ambulation Surface Level;Indoor   Ramp 5: Supervision   Ramp Details (indicate cue type and reason) cues for safety with RW   Curb 4: Min assist   Curb Details (indicate cue type and reason) Cues for sequencing and technqiue with RW.      Self-Care   Self-Care Other Self-Care Comments   Other Self-Care Comments  Discussed neuropsych services with daughter, see impression statement.      Neuro Re-ed    Neuro Re-ed Details  added  balance to HEP, see pt instruction.      Exercises   Exercises Other Exercises   Other Exercises  Performed current HEP, see pt instructions, supine SLR  on LLE x 10 reps, standing hip extension x 10 reps BLE, seated nustep x 5 mins at level 4 resistance with BUEs/LE.                 PT Education - 06/11/16 1451    Education provided Yes   Education Details balance additions to HEP and spoke with daughter regarding neuropsych services starting Jan 22nd   Person(s) Educated Patient;Spouse;Child(ren)   Methods Explanation;Demonstration   Comprehension Verbalized understanding;Returned demonstration          PT Short Term Goals - 06/02/16 1134      PT SHORT TERM GOAL #1   Title The patient will be able to perform HEP with assist from family. (Target Date: 06/18/16)   Time 3   Period Weeks   Status On-going     PT SHORT TERM GOAL #2   Title Will formally assess BERG and improve score by 3 points from baseline in order to indicate decreased fall risk.     Baseline 06/02/16: 36/56 baseline score for Berg Balance Test   Time 3   Period Weeks   Status On-going     PT SHORT TERM GOAL #3   Title Pt will perform 5TSS test in <38 secs with single UE support in order to indicate improved functional strength.     Time 3   Period Weeks   Status On-going     PT SHORT TERM GOAL #4   Title Pt will improve gait speed to >1.8 ft/sec in order to indicate decreased fall risk and improved efficiency of gait.     Time 3   Period Weeks   Status On-going           PT Long Term Goals - 05/29/16 0731      PT LONG TERM GOAL #1   Title Pt will improve BERG score by 6 points from baseline in order to indicate decreaesd fall risk.  (Target Date: 07/10/15)   Time 6   Period Weeks   Status New     PT LONG TERM GOAL #2   Title Pt will perform 5TSS test in <28 secs with single UE support in order to indicate improved functional strength.      Time 6   Period Weeks   Status New      PT LONG TERM GOAL #3   Title Pt will improve gait speed to 2.4 ft/sec in order to indicate decreased fall risk and improved efficiency of gait.     Time 6   Period Weeks  Status New     PT LONG TERM GOAL #4   Title Pt will negotiate up/down 4 stairs with single handrail in reciprocal pattern at S level in order to indicate safe home entry/exit.     Time 6   Period Weeks   Status New               Plan - 06/11/16 1545    Clinical Impression Statement Skilled session focused on going over current HEP given at last session as she/family have not been compliant but do note that she is walking more with RW and arrived today with RW.  Added 3 balance exercises to alternate with strength HEP.  Also discussed neuropsych services and what they could assist with, esp with helping husband to understand her cognitive deficits and poor initiation as he tends to interpret this as laziness.  Daughter verbalized understanding.     Rehab Potential Fair   Clinical Impairments Affecting Rehab Potential poor cognition and carryover from CVA   PT Frequency 1x / week   PT Duration 6 weeks   PT Treatment/Interventions ADLs/Self Care Home Management;DME Instruction;Gait training;Stair training;Functional mobility training;Therapeutic activities;Therapeutic exercise;Balance training;Neuromuscular re-education;Patient/family education;Orthotic Fit/Training;Energy conservation;Vestibular   PT Next Visit Plan STGs, continue to work on LE strengthening, add balance to HEP as able, gait with RW for outdoor gait?, foot up brace for LLE?   Consulted and Agree with Plan of Care Patient;Family member/caregiver   Family Member Consulted husband and daughter      Patient will benefit from skilled therapeutic intervention in order to improve the following deficits and impairments:     Visit Diagnosis: Unsteadiness on feet  Muscle weakness (generalized)  Other abnormalities of gait and mobility  Hemiplegia and  hemiparesis following cerebral infarction affecting left non-dominant side [facility])     Problem List Patient Active Problem List   Diagnosis Date Noted  . Allergic reaction 02/26/2016  . Blood in stool 02/26/2016  . Decreased strength, endurance, and mobility 09/25/2015  . Adjustment disorder with depressed mood 07/27/2015  . Left homonymous hemianopsia 09/06/2014  . Occipital infarction ([place]) 09/05/2014  . Acute ischemic right MCA stroke ([place]) 08/30/2014  . Acute right PCA stroke ([place]) 08/30/2014  . Abnormal chest x-ray 08/22/2014  . Type 2 diabetes mellitus with diabetic neuropathy ([place]) 01/04/2014  . HYPERCHOLESTEROLEMIA, MILD 05/22/2009  . PERS HX NONCOMPLIANCE W/MED TX PRS HAZARDS HLTH 07/11/2007  . Essential hypertension 05/04/2007  . Osteoarthritis 05/04/2007  . Fibromyalgia 05/04/2007    Cameron Sprang, PT, MPT [facility] [address] [place], [place], [address] Phone: [phone]   Fax:  [phone] 06/11/16, 3:59 PM  Name: BENNIE LEGO MRN: [id number] Date of Birth: [birth year]

## 2016-06-11 NOTE — Patient Instructions (Addendum)
issued the following to pt's HEP: Bridge    Lie back, legs bent. Lift hips up off bed. Hold for 5 seconds.  Repeat 10_ times. Do _1-2_ sessions per day.  http://pm.exer.us/55   Copyright  VHI. All rights reserved.    Strengthening: Hip Abductor - Resisted    With band looped around both legs above knees, push thighs apart. Hold for 5 seconds. Repeat _10___ times per set. Do _1_ sets per session. Do _1-2_ sessions per day.  http://orth.exer.us/688   Copyright  VHI. All rights reserved.    Functional Quadriceps: Sit to Stand    Sit on edge of chair, feet flat on floor. Stand upright, straightening knees fully. Slowly sit back down, use arms as needed. Repeat _10_ times per set. Do _1_ sets per session. Do _1-2_ sessions per day.  http://orth.exer.us/735   Copyright  VHI. All rights reserved.   Balance exercises:    Tandem Walking    Stand next to counter top with hand closest to counter on counter for support.  Walk with each foot directly in front of other, heel of one foot touching toes of other foot with each step. Both feet straight ahead.  At the end, turn around and go back.  Repeat x 3 reps down and back.     Copyright  VHI. All rights reserved.   SINGLE LIMB STANCE    Stand facing counter top for support.  See if you can just use one hand.  Progress by letting your hand be light on the counter.  Raise one leg. Hold _10__ seconds. Repeat with other leg. _3__ reps per set, _1_ sets per day, _3__ days per week  Copyright  VHI. All rights reserved.    EXTENSION: Standing (Active)    Stand facing counter top, both feet flat. Draw right leg behind body as far as possible. Keep knee straight and stand up tall, don't bend over.  Complete _1__ sets of _10__ repetitions. Perform _1__ sessions per day.  http://gtsc.exer.us/77   Copyright  VHI. All rights reserved.

## 2016-06-18 ENCOUNTER — Ambulatory Visit: Payer: Medicare HMO | Admitting: Physical Therapy

## 2016-06-18 ENCOUNTER — Encounter: Payer: Self-pay | Admitting: Physical Therapy

## 2016-06-18 DIAGNOSIS — I69354 Hemiplegia and hemiparesis following cerebral infarction affecting left non-dominant side: Secondary | ICD-10-CM

## 2016-06-18 DIAGNOSIS — M6281 Muscle weakness (generalized): Secondary | ICD-10-CM

## 2016-06-18 DIAGNOSIS — R2681 Unsteadiness on feet: Secondary | ICD-10-CM | POA: Diagnosis not present

## 2016-06-18 DIAGNOSIS — R2689 Other abnormalities of gait and mobility: Secondary | ICD-10-CM

## 2016-06-18 NOTE — Therapy (Addendum)
[facility] [address] [place], [place], [address] Phone: [phone]   Fax:  [phone]  Physical Therapy Treatment and D/C Summary  Patient Details  Name: Kayla [name] MRN: [id number] Date of Birth: 10-Mar-1941 Referring Provider: Pricilla Holm, MD  Encounter Date: 06/18/2016      PT End of Session - 06/18/16 1409    Visit Number 4   Number of Visits 7   Date for PT Re-Evaluation 07/13/16   Authorization Type AET[employer] MCR Gcode on every 10th visit   PT Start Time 1404   PT Stop Time 1445   PT Time Calculation (min) 41 min   Equipment Utilized During Treatment Gait belt   Activity Tolerance Patient tolerated treatment well   Behavior During Therapy [facility] for tasks assessed/performed      Past Medical History:  Diagnosis Date  . Allergy    Shell fish  . Anxiety   . Arthritis    "knees, back, probably left hand" (01/14/2015)  . Asthmatic bronchitis   . Blind left eye   . Constipation   . Coronary artery disease   . Depression   . Diverticulosis of colon   . DJD (degenerative joint disease)   . Fibromyalgia   . GERD (gastroesophageal reflux disease)   . Heart murmur   . History of gout   . Hypercholesteremia   . Hypertension   . Personal history of noncompliance with medical treatment, presenting hazards to health   . Prolapse of vaginal vault after hysterectomy   . Proteinuria   . Sickle-cell trait ([place])   . Somatic dysfunction   . Stroke [facility]) ~ 08/2014   "blind in left eye; weak on left side since" (01/14/2015)  . Type II diabetes mellitus ([place])   . Venous insufficiency     Past Surgical History:  Procedure Laterality Date  . ABDOMINAL HYSTERECTOMY    . CARDIAC CATHETERIZATION N/A 01/14/2015   Procedure: Left Heart Cath and Coronary Angiography;  Surgeon: Charolette Forward, MD;  Location: [place] CV LAB;  Service: Cardiovascular;  Laterality: N/A;  . CARDIAC CATHETERIZATION  ~ 2011  .  CATARACT EXTRACTION W/ INTRAOCULAR LENS  IMPLANT, BILATERAL Bilateral 2012  . CORONARY ANGIOPLASTY    . DILATION AND CURETTAGE OF UTERUS  "probably"  . LOOP RECORDER IMPLANT N/A 09/03/2014   Procedure: LOOP RECORDER IMPLANT;  Surgeon: Thompson Grayer, MD;  Location: [facility] CATH LAB;  Service: Cardiovascular;  Laterality: N/A;  . ROBOTIC ASSISTED LAPAROSCOPIC SACROCOLPOPEXY  09/2009   Dr. Matilde Sprang  . TEE WITHOUT CARDIOVERSION N/A 09/02/2014   Procedure: TRANSESOPHAGEAL ECHOCARDIOGRAM (TEE);  Surgeon: Lelon Perla, MD;  Location: [facility] ENDOSCOPY;  Service: Cardiovascular;  Laterality: N/A;    There were no vitals filed for this visit.      Subjective Assessment - 06/18/16 1407    Subjective Spouse reports that pt has a place on her left leg where she bumped it on the car door. No falls or pain to report at this time.   Patient is accompained by: Family member   Limitations House hold activities;Walking   Patient Stated Goals "to walk better."    Currently in Pain? No/denies   Pain Score 0-No pain            [facility] PT Assessment - 06/18/16 1418      Berg Balance Test   Sit to Stand Able to stand without using hands and stabilize independently   Standing Unsupported Able to stand safely 2 minutes  Sitting with Back Unsupported but Feet Supported on Floor or Stool Able to sit safely and securely 2 minutes   Stand to Sit Controls descent by using hands   Transfers Able to transfer safely, definite need of hands   Standing Unsupported with Eyes Closed Able to stand 10 seconds safely   Standing Ubsupported with Feet Together Able to place feet together independently and stand for 1 minute with supervision   From Standing, Reach Forward with Outstretched Arm Can reach forward >12 cm safely (5")  6 inches   From Standing Position, Pick up Object from Floor Able to pick up shoe, needs supervision   From Standing Position, Turn to Look Behind Over each Shoulder Looks behind one side only/other  side shows less weight shift  left > right (sideways only)   Turn 360 Degrees Able to turn 360 degrees safely but slowly  > 9 seconds   Standing Unsupported, Alternately Place Feet on Step/Stool Able to complete 4 steps without aid or supervision   Standing Unsupported, One Foot in Front Able to plae foot ahead of the other independently and hold 30 seconds   Standing on One Leg Tries to lift leg/unable to hold 3 seconds but remains standing independently   Total Score 42   Berg comment: 42/56= significant risk (>80%)            [facility] Adult PT Treatment/Exercise - 06/18/16 1418      Transfers   Transfers Sit to Stand;Stand to Sit   Sit to Stand 5: Supervision;With upper extremity assist;Without upper extremity assist;With armrests;From bed;From chair/3-in-1   Five time sit to stand comments  18.47 sec's with right UE only, however did not achieve full upright standing each time   Stand to Sit 5: Supervision;With upper extremity assist;Without upper extremity assist;To bed;To chair/3-in-1;With armrests     Ambulation/Gait   Ambulation/Gait Yes   Ambulation/Gait Assistance 5: Supervision   Ambulation Distance (Feet) 220 Feet  x1, 280 x1   Assistive device Rolling walker   Gait Pattern Step-through pattern;Decreased hip/knee flexion - left;Decreased weight shift to left;Lateral hip instability;Trunk flexed;Poor foot clearance - left   Ambulation Surface Level;Indoor   Gait velocity 16.78 sec's= 1.96 ft/sec with RW   Ramp 5: Supervision   Ramp Details (indicate cue type and reason) cues on posture and RW position with ramp negotiation   Curb Other (comment)  min guard assist with RW   Curb Details (indicate cue type and reason) cues on sequencing and technique            PT Short Term Goals - 06/18/16 1409      PT SHORT TERM GOAL #1   Title The patient will be able to perform HEP with assist from family. (Target Date: 06/18/16)   Baseline 06/18/16: spouse reports the HEP  is posted on wall and he helps her, however she gets tired and only does a few of them   Time --   Period --   Status Partially Met     PT SHORT TERM GOAL #2   Title Will formally assess BERG and improve score by 3 points from baseline in order to indicate decreased fall risk.     Baseline 06/18/16: scored 42/56 today (6 point increase from baseline)   Time --   Period --   Status Achieved     PT SHORT TERM GOAL #3   Title Pt will perform 5TSS test in <38 secs with single UE support in order to  indicate improved functional strength.     Baseline 06/18/16: 18.74 sec's with right UE only, cues for full upright posture which pt did not achieve all times   Time --   Period --   Status Achieved     PT SHORT TERM GOAL #4   Title Pt will improve gait speed to >1.8 ft/sec in order to indicate decreased fall risk and improved efficiency of gait.     Baseline 06/18/16: 16.78 sec's= 1.96 ft/sec   Status Achieved           PT Long Term Goals - 05/29/16 0731      PT LONG TERM GOAL #1   Title Pt will improve BERG score by 6 points from baseline in order to indicate decreaesd fall risk.  (Target Date: 07/10/15)   Time 6   Period Weeks   Status New     PT LONG TERM GOAL #2   Title Pt will perform 5TSS test in <28 secs with single UE support in order to indicate improved functional strength.      Time 6   Period Weeks   Status New     PT LONG TERM GOAL #3   Title Pt will improve gait speed to 2.4 ft/sec in order to indicate decreased fall risk and improved efficiency of gait.     Time 6   Period Weeks   Status New     PT LONG TERM GOAL #4   Title Pt will negotiate up/down 4 stairs with single handrail in reciprocal pattern at S level in order to indicate safe home entry/exit.     Time 6   Period Weeks   Status New               Plan - 06/18/16 1409    Clinical Impression Statement Pt met all STGs today. Continues to need cues for general safety with mobility and use of  RW for fall prevention.    Rehab Potential Fair   Clinical Impairments Affecting Rehab Potential poor cognition and carryover from CVA   PT Frequency 1x / week   PT Duration 6 weeks   PT Treatment/Interventions ADLs/Self Care Home Management;DME Instruction;Gait training;Stair training;Functional mobility training;Therapeutic activities;Therapeutic exercise;Balance training;Neuromuscular re-education;Patient/family education;Orthotic Fit/Training;Energy conservation;Vestibular   PT Next Visit Plan continue to work on LE strengthening, add balance to HEP as able, gait with RW for outdoor gait?, foot up brace for LLE?   Consulted and Agree with Plan of Care Patient;Family member/caregiver   Family Member Consulted husband and daughter      Patient will benefit from skilled therapeutic intervention in order to improve the following deficits and impairments:     Visit Diagnosis: Unsteadiness on feet  Muscle weakness (generalized)  Other abnormalities of gait and mobility  Hemiplegia and hemiparesis following cerebral infarction affecting left non-dominant side ([place])    PHYSICAL THERAPY DISCHARGE SUMMARY  Visits from Start of Care: 4  Current functional level related to goals / functional outcomes: Unsure as pt was recently hospitalized and therefore will D/C at this time.    Remaining deficits: See LTGs   Education / Equipment: HEP  Plan: Patient agrees to discharge.  Patient goals were not met. Patient is being discharged due to a change in medical status.  ?????         Addendum: G Codes Functional Assessment Tool: Gait speed 1.53 ft/sec, 5TSS 48.59 secs with UE support Functional Limitation:  Mobility: Walking and Moving Around Current Status [phone]): CL DC  Status  [phone]): CL    Problem List Patient Active Problem List   Diagnosis Date Noted  . Allergic reaction 02/26/2016  . Blood in stool 02/26/2016  . Decreased strength, endurance, and mobility 09/25/2015   . Adjustment disorder with depressed mood 07/27/2015  . Left homonymous hemianopsia 09/06/2014  . Occipital infarction ([place]) 09/05/2014  . Acute ischemic right MCA stroke ([place]) 08/30/2014  . Acute right PCA stroke ([place]) 08/30/2014  . Abnormal chest x-ray 08/22/2014  . Type 2 diabetes mellitus with diabetic neuropathy ([place]) 01/04/2014  . HYPERCHOLESTEROLEMIA, MILD 05/22/2009  . PERS HX NONCOMPLIANCE W/MED TX PRS HAZARDS HLTH 07/11/2007  . Essential hypertension 05/04/2007  . Osteoarthritis 05/04/2007  . Fibromyalgia 05/04/2007    Willow Ora, PTA, [facility] Outpatient Neuro [facility] [address] [place], [place] [address] [phone] 06/18/16, 4:06 PM   Name: Kayla [name] MRN: [id number] Date of Birth: Aug 22, 1940

## 2016-06-19 ENCOUNTER — Encounter (HOSPITAL_COMMUNITY): Payer: Self-pay | Admitting: Emergency Medicine

## 2016-06-19 ENCOUNTER — Emergency Department (HOSPITAL_COMMUNITY): Payer: Medicare HMO

## 2016-06-19 ENCOUNTER — Inpatient Hospital Stay (HOSPITAL_COMMUNITY)
Admission: EM | Admit: 2016-06-19 | Discharge: 2016-06-25 | DRG: 056 | Disposition: A | Discharge status: Home Health | Payer: Medicare HMO | Attending: Internal Medicine | Admitting: Internal Medicine

## 2016-06-19 DIAGNOSIS — F329 Major depressive disorder, single episode, unspecified: Secondary | ICD-10-CM | POA: Diagnosis present

## 2016-06-19 DIAGNOSIS — Z880 Allergy status to penicillin: Secondary | ICD-10-CM

## 2016-06-19 DIAGNOSIS — K219 Gastro-esophageal reflux disease without esophagitis: Secondary | ICD-10-CM | POA: Diagnosis present

## 2016-06-19 DIAGNOSIS — Z7982 Long term (current) use of aspirin: Secondary | ICD-10-CM

## 2016-06-19 DIAGNOSIS — F419 Anxiety disorder, unspecified: Secondary | ICD-10-CM | POA: Diagnosis present

## 2016-06-19 DIAGNOSIS — E785 Hyperlipidemia, unspecified: Secondary | ICD-10-CM | POA: Diagnosis present

## 2016-06-19 DIAGNOSIS — I129 Hypertensive chronic kidney disease with stage 1 through stage 4 chronic kidney disease, or unspecified chronic kidney disease: Secondary | ICD-10-CM | POA: Diagnosis present

## 2016-06-19 DIAGNOSIS — G9340 Encephalopathy, unspecified: Secondary | ICD-10-CM | POA: Diagnosis not present

## 2016-06-19 DIAGNOSIS — H5462 Unqualified visual loss, left eye, normal vision right eye: Secondary | ICD-10-CM | POA: Diagnosis present

## 2016-06-19 DIAGNOSIS — R262 Difficulty in walking, not elsewhere classified: Secondary | ICD-10-CM | POA: Diagnosis present

## 2016-06-19 DIAGNOSIS — J9690 Respiratory failure, unspecified, unspecified whether with hypoxia or hypercapnia: Secondary | ICD-10-CM | POA: Diagnosis not present

## 2016-06-19 DIAGNOSIS — Z90710 Acquired absence of both cervix and uterus: Secondary | ICD-10-CM

## 2016-06-19 DIAGNOSIS — R569 Unspecified convulsions: Secondary | ICD-10-CM | POA: Diagnosis not present

## 2016-06-19 DIAGNOSIS — I69398 Other sequelae of cerebral infarction: Secondary | ICD-10-CM | POA: Diagnosis not present

## 2016-06-19 DIAGNOSIS — Z8249 Family history of ischemic heart disease and other diseases of the circulatory system: Secondary | ICD-10-CM

## 2016-06-19 DIAGNOSIS — Z9119 Patient's noncompliance with other medical treatment and regimen: Secondary | ICD-10-CM

## 2016-06-19 DIAGNOSIS — J45909 Unspecified asthma, uncomplicated: Secondary | ICD-10-CM | POA: Diagnosis present

## 2016-06-19 DIAGNOSIS — I10 Essential (primary) hypertension: Secondary | ICD-10-CM | POA: Diagnosis not present

## 2016-06-19 DIAGNOSIS — R9401 Abnormal electroencephalogram [EEG]: Secondary | ICD-10-CM | POA: Diagnosis not present

## 2016-06-19 DIAGNOSIS — N183 Chronic kidney disease, stage 3 (moderate): Secondary | ICD-10-CM | POA: Diagnosis present

## 2016-06-19 DIAGNOSIS — Z7952 Long term (current) use of systemic steroids: Secondary | ICD-10-CM

## 2016-06-19 DIAGNOSIS — J9601 Acute respiratory failure with hypoxia: Secondary | ICD-10-CM | POA: Diagnosis not present

## 2016-06-19 DIAGNOSIS — N189 Chronic kidney disease, unspecified: Secondary | ICD-10-CM | POA: Diagnosis present

## 2016-06-19 DIAGNOSIS — M797 Fibromyalgia: Secondary | ICD-10-CM | POA: Diagnosis present

## 2016-06-19 DIAGNOSIS — M9909 Segmental and somatic dysfunction of abdomen and other regions: Secondary | ICD-10-CM | POA: Diagnosis present

## 2016-06-19 DIAGNOSIS — E7800 Pure hypercholesterolemia, unspecified: Secondary | ICD-10-CM | POA: Diagnosis present

## 2016-06-19 DIAGNOSIS — J9600 Acute respiratory failure, unspecified whether with hypoxia or hypercapnia: Secondary | ICD-10-CM

## 2016-06-19 DIAGNOSIS — Z9841 Cataract extraction status, right eye: Secondary | ICD-10-CM

## 2016-06-19 DIAGNOSIS — H538 Other visual disturbances: Secondary | ICD-10-CM | POA: Diagnosis present

## 2016-06-19 DIAGNOSIS — I639 Cerebral infarction, unspecified: Secondary | ICD-10-CM | POA: Diagnosis not present

## 2016-06-19 DIAGNOSIS — R4781 Slurred speech: Secondary | ICD-10-CM | POA: Diagnosis present

## 2016-06-19 DIAGNOSIS — E1122 Type 2 diabetes mellitus with diabetic chronic kidney disease: Secondary | ICD-10-CM | POA: Diagnosis present

## 2016-06-19 DIAGNOSIS — Z9861 Coronary angioplasty status: Secondary | ICD-10-CM

## 2016-06-19 DIAGNOSIS — D573 Sickle-cell trait: Secondary | ICD-10-CM | POA: Diagnosis present

## 2016-06-19 DIAGNOSIS — Z91013 Allergy to seafood: Secondary | ICD-10-CM

## 2016-06-19 DIAGNOSIS — Z7902 Long term (current) use of antithrombotics/antiplatelets: Secondary | ICD-10-CM

## 2016-06-19 DIAGNOSIS — Z4682 Encounter for fitting and adjustment of non-vascular catheter: Secondary | ICD-10-CM | POA: Diagnosis not present

## 2016-06-19 DIAGNOSIS — I2510 Atherosclerotic heart disease of native coronary artery without angina pectoris: Secondary | ICD-10-CM | POA: Diagnosis present

## 2016-06-19 DIAGNOSIS — Z888 Allergy status to other drugs, medicaments and biological substances status: Secondary | ICD-10-CM

## 2016-06-19 DIAGNOSIS — Z91010 Allergy to peanuts: Secondary | ICD-10-CM

## 2016-06-19 DIAGNOSIS — Z961 Presence of intraocular lens: Secondary | ICD-10-CM | POA: Diagnosis present

## 2016-06-19 DIAGNOSIS — Z9842 Cataract extraction status, left eye: Secondary | ICD-10-CM

## 2016-06-19 DIAGNOSIS — R4182 Altered mental status, unspecified: Secondary | ICD-10-CM | POA: Diagnosis not present

## 2016-06-19 DIAGNOSIS — J189 Pneumonia, unspecified organism: Secondary | ICD-10-CM | POA: Diagnosis present

## 2016-06-19 DIAGNOSIS — Z7984 Long term (current) use of oral hypoglycemic drugs: Secondary | ICD-10-CM

## 2016-06-19 DIAGNOSIS — G9349 Other encephalopathy: Secondary | ICD-10-CM | POA: Diagnosis present

## 2016-06-19 DIAGNOSIS — Z91018 Allergy to other foods: Secondary | ICD-10-CM

## 2016-06-19 DIAGNOSIS — I872 Venous insufficiency (chronic) (peripheral): Secondary | ICD-10-CM | POA: Diagnosis present

## 2016-06-19 DIAGNOSIS — M109 Gout, unspecified: Secondary | ICD-10-CM | POA: Diagnosis present

## 2016-06-19 DIAGNOSIS — E872 Acidosis: Secondary | ICD-10-CM | POA: Diagnosis present

## 2016-06-19 LAB — APTT: aPTT: 27 seconds (ref 24–36)

## 2016-06-19 LAB — COMPREHENSIVE METABOLIC PANEL
ALT: 13 U/L — ABNORMAL LOW (ref 14–54)
AST: 18 U/L (ref 15–41)
Albumin: 3.6 g/dL (ref 3.5–5.0)
Alkaline Phosphatase: 72 U/L (ref 38–126)
Anion gap: 15 (ref 5–15)
BILIRUBIN TOTAL: 0.3 mg/dL (ref 0.3–1.2)
BUN: 17 mg/dL (ref 6–20)
CALCIUM: 8.7 mg/dL — AB (ref 8.9–10.3)
CO2: 16 mmol/L — ABNORMAL LOW (ref 22–32)
Chloride: 110 mmol/L (ref 101–111)
Creatinine, Ser: 1.4 mg/dL — ABNORMAL HIGH (ref 0.44–1.00)
GFR calc Af Amer: 41 mL/min — ABNORMAL LOW (ref >60)
GFR, EST NON AFRICAN AMERICAN: 35 mL/min — AB (ref >60)
Glucose, Bld: 144 mg/dL — ABNORMAL HIGH (ref 65–99)
POTASSIUM: 4.5 mmol/L (ref 3.5–5.1)
Sodium: 141 mmol/L (ref 135–145)
TOTAL PROTEIN: 7.6 g/dL (ref 6.5–8.1)

## 2016-06-19 LAB — CBC
HEMATOCRIT: 40.4 % (ref 36.0–46.0)
HEMOGLOBIN: 13.4 g/dL (ref 12.0–15.0)
MCH: 28.3 pg (ref 26.0–34.0)
MCHC: 33.2 g/dL (ref 30.0–36.0)
MCV: 85.4 fL (ref 78.0–100.0)
Platelets: 252 10*3/uL (ref 150–400)
RBC: 4.73 MIL/uL (ref 3.87–5.11)
RDW: 16.5 % — ABNORMAL HIGH (ref 11.5–15.5)
WBC: 8.5 10*3/uL (ref 4.0–10.5)

## 2016-06-19 LAB — I-STAT CHEM 8, ED
BUN: 20 mg/dL (ref 6–20)
CALCIUM ION: 1.08 mmol/L — AB (ref 1.15–1.40)
CREATININE: 1.4 mg/dL — AB (ref 0.44–1.00)
Chloride: 109 mmol/L (ref 101–111)
GLUCOSE: 153 mg/dL — AB (ref 65–99)
HCT: 41 % (ref 36.0–46.0)
Hemoglobin: 13.9 g/dL (ref 12.0–15.0)
Potassium: 4.4 mmol/L (ref 3.5–5.1)
Sodium: 142 mmol/L (ref 135–145)
TCO2: 21 mmol/L (ref 0–100)

## 2016-06-19 LAB — DIFFERENTIAL
BASOS ABS: 0 10*3/uL (ref 0.0–0.1)
Basophils Relative: 0 %
EOS ABS: 0.3 10*3/uL (ref 0.0–0.7)
EOS PCT: 3 %
LYMPHS ABS: 1.3 10*3/uL (ref 0.7–4.0)
Lymphocytes Relative: 15 %
MONOS PCT: 5 %
Monocytes Absolute: 0.4 10*3/uL (ref 0.1–1.0)
Neutro Abs: 6.6 10*3/uL (ref 1.7–7.7)
Neutrophils Relative %: 77 %

## 2016-06-19 LAB — PROTIME-INR
INR: 1.01
Prothrombin Time: 13.3 seconds (ref 11.4–15.2)

## 2016-06-19 LAB — CBG MONITORING, ED: Glucose-Capillary: 151 mg/dL — ABNORMAL HIGH (ref 65–99)

## 2016-06-19 LAB — I-STAT TROPONIN, ED: TROPONIN I, POC: 0 ng/mL (ref 0.00–0.08)

## 2016-06-19 MED ORDER — LORAZEPAM 2 MG/ML IJ SOLN
1.0000 mg | Freq: Once | INTRAMUSCULAR | Status: AC
  Administered 2016-06-20: 1 mg via INTRAVENOUS
  Filled 2016-06-19: qty 1

## 2016-06-19 MED ORDER — LORAZEPAM 2 MG/ML IJ SOLN
2.0000 mg | Freq: Once | INTRAMUSCULAR | Status: AC
  Administered 2016-06-19: 2 mg via INTRAVENOUS

## 2016-06-19 MED ORDER — ETOMIDATE 2 MG/ML IV SOLN
INTRAVENOUS | Status: AC | PRN
  Administered 2016-06-19: 20 mg via INTRAVENOUS

## 2016-06-19 MED ORDER — FENTANYL CITRATE (PF) 100 MCG/2ML IJ SOLN: 50.0000 ug | INTRAMUSCULAR | Status: DC | PRN

## 2016-06-19 MED ORDER — FENTANYL CITRATE (PF) 100 MCG/2ML IJ SOLN
50.0000 ug | INTRAMUSCULAR | Status: DC | PRN
  Administered 2016-06-20: 50 ug via INTRAVENOUS
  Filled 2016-06-19: qty 2

## 2016-06-19 MED ORDER — LORAZEPAM 2 MG/ML IJ SOLN
INTRAMUSCULAR | Status: AC
  Administered 2016-06-19: 2 mg via INTRAVENOUS
  Filled 2016-06-19: qty 1

## 2016-06-19 MED ORDER — LORAZEPAM 1 MG PO TABS: 2.0000 mg | ORAL_TABLET | Freq: Once | ORAL | Status: DC

## 2016-06-19 MED ORDER — LEVOFLOXACIN IN D5W 750 MG/150ML IV SOLN
750.0000 mg | Freq: Once | INTRAVENOUS | Status: DC
  Administered 2016-06-20: 750 mg via INTRAVENOUS
  Filled 2016-06-19: qty 150

## 2016-06-19 MED ORDER — PROPOFOL 1000 MG/100ML IV EMUL
INTRAVENOUS | Status: AC
  Filled 2016-06-19: qty 100

## 2016-06-19 MED ORDER — SUCCINYLCHOLINE CHLORIDE 20 MG/ML IJ SOLN
INTRAMUSCULAR | Status: AC | PRN
  Administered 2016-06-19: 150 mg via INTRAVENOUS

## 2016-06-19 MED ORDER — PROPOFOL 1000 MG/100ML IV EMUL
5.0000 ug/kg/min | Freq: Once | INTRAVENOUS | Status: AC
  Administered 2016-06-19: 5 ug/kg/min via INTRAVENOUS

## 2016-06-19 NOTE — ED Notes (Signed)
Pt ativan

## 2016-06-19 NOTE — ED Triage Notes (Signed)
Per the pt family pt last seen normal at 0930. Pt able to walk and talk with clear speech. When family arrived back to the house this evening around 1930 pt head deviated to the left . Pt recently completed physical therapy yesterday and was able to walk and talk with no difficulties. Pt was able to move the left side. Pt does have a hx of a stroke. Pt previously affected with Left eye blindness from last stroke. Family reports that pt was dragging the L leg and reports finding her slouched down in the chair and her head turned to the left. Family reports that her speech is worse.

## 2016-06-19 NOTE — Consult Note (Signed)
Admission H&P    Chief Complaint: new-onset seizure activity.  HPI: Kayla AINLEY is an 75 y.o. female with ahistory right cerebral infarctions, diabetes mellitus, hypertension, hyperlipidemia and coronary artery disease, brought to the emergency room with altered mental status. She was last seen well at 9:30 a.m. When family returned at 7:30 PM she was noted to have slurred speech and slumped to the left side, as well as staring to the left side. After arriving in the emergency room mental status improved. She was alert and answering questions appropriately for a while, then suddenly stopped talking and became tense and demonstrated a generalized seizure. She was given Ativan 2 mg and subsequently intubated for airway protection. She was also started on propofol drip IV. No further seizure activity was reported. CT scan of her head showed old right occipital, posterior temporal and frontal infarctions. No acute findings were noted. She has no history of seizure activity.  Past Medical History:  Diagnosis Date  . Allergy    Shell fish  . Anxiety   . Arthritis    "knees, back, probably left hand" (01/14/2015)  . Asthmatic bronchitis   . Blind left eye   . Constipation   . Coronary artery disease   . Depression   . Diverticulosis of colon   . DJD (degenerative joint disease)   . Fibromyalgia   . GERD (gastroesophageal reflux disease)   . Heart murmur   . History of gout   . Hypercholesteremia   . Hypertension   . Personal history of noncompliance with medical treatment, presenting hazards to health   . Prolapse of vaginal vault after hysterectomy   . Proteinuria   . Sickle-cell trait ([place])   . Somatic dysfunction   . Stroke [facility]) ~ 08/2014   "blind in left eye; weak on left side since" (01/14/2015)  . Type II diabetes mellitus ([place])   . Venous insufficiency     Past Surgical History:  Procedure Laterality Date  . ABDOMINAL HYSTERECTOMY    . CARDIAC CATHETERIZATION N/A 01/14/2015    Procedure: Left Heart Cath and Coronary Angiography;  Surgeon: Charolette Forward, MD;  Location: [place] CV LAB;  Service: Cardiovascular;  Laterality: N/A;  . CARDIAC CATHETERIZATION  ~ 2011  . CATARACT EXTRACTION W/ INTRAOCULAR LENS  IMPLANT, BILATERAL Bilateral 2012  . CORONARY ANGIOPLASTY    . DILATION AND CURETTAGE OF UTERUS  "probably"  . LOOP RECORDER IMPLANT N/A 09/03/2014   Procedure: LOOP RECORDER IMPLANT;  Surgeon: Thompson Grayer, MD;  Location: [facility] CATH LAB;  Service: Cardiovascular;  Laterality: N/A;  . ROBOTIC ASSISTED LAPAROSCOPIC SACROCOLPOPEXY  09/2009   Dr. Matilde Sprang  . TEE WITHOUT CARDIOVERSION N/A 09/02/2014   Procedure: TRANSESOPHAGEAL ECHOCARDIOGRAM (TEE);  Surgeon: Lelon Perla, MD;  Location: [facility] ENDOSCOPY;  Service: Cardiovascular;  Laterality: N/A;    Family History  Problem Relation Age of Onset  . Prostate cancer Father   . Hypertension Father   . Hypertension Sister   . Heart attack Neg Hx   . Stroke Neg Hx    Social History:  reports that she has never smoked. She has never used smokeless tobacco. She reports that she drinks alcohol. She reports that she does not use drugs.  Allergies:  Allergies  Allergen Reactions  . Amlodipine Shortness Of Breath and Rash  . Fish Allergy Hives  . Penicillins Other (See Comments)    REACTION: red rash  . Shellfish Allergy Hives  . Peanuts [Peanut Oil] Rash  . Tomato Rash  Medications: Outpatient medications were reviewed by me.  ROS: Unavailable as patient is sedated and intubated.  Physical Examination: Blood pressure (!) 186/104, pulse 120, temperature 97.8 F (36.6 C), temperature source Oral, resp. rate 25, height 5' 7" (1.702 m), weight 81.6 kg (180 lb), SpO2 99 %.  HEENT-  Normocephalic, no lesions, without obvious abnormality.  Normal external eye and conjunctiva.  Normal TM's bilaterally.  Normal auditory canals and external ears. Normal external nose, mucus membranes and septum.  Normal  pharynx. Neck supple with no masses, nodes, nodules or enlargement. Cardiovascular - regular rate and rhythm, S1, S2 normal, no murmur, click, rub or gallop Lungs - chest clear, no wheezing, rales, normal symmetric air entry Abdomen - soft, non-tender; bowel sounds normal; no masses,  no organomegaly Extremities - no joint deformities, effusion, or inflammation  Neurologic Examination: Patient was intubated and on mechanical ventilation, as well as sedated with propofol IV. She was minimally responsive to noxious stimuli. No seizure activity was noted. Pupils were equal and of normal size, and did not react to light. Extraocular movements were absent with oculocephalic maneuvers. Face was symmetrical with no focal weakness. Muscle tone was increased in left extremities, and normal on the right. There was no abnormal posturing. Patient had no spontaneous movements. Deep tendon reflexes were asymmetric, greater on the left. Plantar response on the left was extensor, and on the right mute.  Results for orders placed or performed during the hospital encounter of 06/19/16 (from the past 48 hour(s))  CBG monitoring, ED     Status: Abnormal   Collection Time: 06/19/16 10:04 PM  Result Value Ref Range   Glucose-Capillary 151 (H) 65 - 99 mg/dL  Protime-INR     Status: None   Collection Time: 06/19/16 10:35 PM  Result Value Ref Range   Prothrombin Time 13.3 11.4 - 15.2 seconds   INR 1.01   APTT     Status: None   Collection Time: 06/19/16 10:35 PM  Result Value Ref Range   aPTT 27 24 - 36 seconds  CBC     Status: Abnormal   Collection Time: 06/19/16 10:35 PM  Result Value Ref Range   WBC 8.5 4.0 - 10.5 K/uL   RBC 4.73 3.87 - 5.11 MIL/uL   Hemoglobin 13.4 12.0 - 15.0 g/dL   HCT 40.4 36.0 - 46.0 %   MCV 85.4 78.0 - 100.0 fL   MCH 28.3 26.0 - 34.0 pg   MCHC 33.2 30.0 - 36.0 g/dL   RDW 16.5 (H) 11.5 - 15.5 %   Platelets 252 150 - 400 K/uL  Differential     Status: None   Collection Time:  06/19/16 10:35 PM  Result Value Ref Range   Neutrophils Relative % 77 %   Neutro Abs 6.6 1.7 - 7.7 K/uL   Lymphocytes Relative 15 %   Lymphs Abs 1.3 0.7 - 4.0 K/uL   Monocytes Relative 5 %   Monocytes Absolute 0.4 0.1 - 1.0 K/uL   Eosinophils Relative 3 %   Eosinophils Absolute 0.3 0.0 - 0.7 K/uL   Basophils Relative 0 %   Basophils Absolute 0.0 0.0 - 0.1 K/uL  Comprehensive metabolic panel     Status: Abnormal   Collection Time: 06/19/16 10:35 PM  Result Value Ref Range   Sodium 141 135 - 145 mmol/L   Potassium 4.5 3.5 - 5.1 mmol/L   Chloride 110 101 - 111 mmol/L   CO2 16 (L) 22 - 32 mmol/L   Glucose, Bld  144 (H) 65 - 99 mg/dL   BUN 17 6 - 20 mg/dL   Creatinine, Ser 1.40 (H) 0.44 - 1.00 mg/dL   Calcium 8.7 (L) 8.9 - 10.3 mg/dL   Total Protein 7.6 6.5 - 8.1 g/dL   Albumin 3.6 3.5 - 5.0 g/dL   AST 18 15 - 41 U/L   ALT 13 (L) 14 - 54 U/L   Alkaline Phosphatase 72 38 - 126 U/L   Total Bilirubin 0.3 0.3 - 1.2 mg/dL   GFR calc non Af Amer 35 (L) >60 mL/min   GFR calc Af Amer 41 (L) >60 mL/min    Comment: (NOTE) The eGFR has been calculated using the CKD EPI equation. This calculation has not been validated in all clinical situations. eGFR's persistently <60 mL/min signify possible Chronic Kidney Disease.    Anion gap 15 5 - 15  I-stat troponin, ED (not at [facility])     Status: None   Collection Time: 06/19/16 10:52 PM  Result Value Ref Range   Troponin i, poc 0.00 0.00 - 0.08 ng/mL   Comment 3            Comment: Due to the release kinetics of cTnI, a negative result within the first hours of the onset of symptoms does not rule out myocardial infarction with certainty. If myocardial infarction is still suspected, repeat the test at appropriate intervals.   I-Stat Chem 8, ED  (not at [facility], [facility])     Status: Abnormal   Collection Time: 06/19/16 10:55 PM  Result Value Ref Range   Sodium 142 135 - 145 mmol/L   Potassium 4.4 3.5 - 5.1 mmol/L   Chloride 109 101 - 111  mmol/L   BUN 20 6 - 20 mg/dL   Creatinine, Ser 1.40 (H) 0.44 - 1.00 mg/dL   Glucose, Bld 153 (H) 65 - 99 mg/dL   Calcium, Ion 1.08 (L) 1.15 - 1.40 mmol/L   TCO2 21 0 - 100 mmol/L   Hemoglobin 13.9 12.0 - 15.0 g/dL   HCT 41.0 36.0 - 46.0 %   Ct Head Wo Contrast  Result Date: 06/19/2016 CLINICAL DATA:  Patient with seizure.  Altered mental status. EXAM: CT HEAD WITHOUT CONTRAST TECHNIQUE: Contiguous axial images were obtained from the base of the skull through the vertex without intravenous contrast. COMPARISON:  CT brain 09/01/2014. FINDINGS: Brain: Ventricles and sulci are prominent. Encephalomalacia demonstrated within the right occipital lobe. Old right frontal periventricular infarct. Extensive low-attenuation throughout the right cerebral hemisphere white matter. No evidence for intracranial hemorrhage, mass lesion or mass-effect. Vascular: Internal carotid arterial vascular calcifications. Skull: Intact. Sinuses/Orbits: Mastoid air cells unremarkable. Paranasal sinuses are well aerated. Other: None. IMPRESSION: Encephalomalacia within the right frontal, temporal and occipital lobes compatible with remote infarct. No acute intracranial hemorrhage, mass lesion or mass-effect. Electronically Signed   By: Lovey Newcomer M.D.   On: 06/19/2016 23:28   Dg Chest Portable 1 View  Result Date: 06/19/2016 CLINICAL DATA:  Post intubation.  Respiratory failure. EXAM: PORTABLE CHEST 1 VIEW COMPARISON:  08/30/2014 CXR FINDINGS: Endotracheal tube tip is 3.2 cm above the carina in satisfactory position. Mild vascular congestion consistent with CHF. Cardiomegaly is noted. Aorta is tortuous. Patchy airspace opacities at the lung bases cannot exclude pneumonia. No suspicious osseous abnormality. IMPRESSION: Endotracheal tube tip in satisfactory position 3.2 cm above the carina. Mild CHF. Patchy airspace disease in both lower lobes cannot exclude pneumonia. Electronically Signed   By: Ashley Royalty M.D.   On:  06/19/2016  23:15    Assessment/Plan 76 year old lady with a history of diabetes mellitus, hypertension, hyperlipidemia and previous right cerebral infarctions presenting with new onset focal and generalized seizure activity. CT scan of her head was unremarkable. Acute recurrent stroke will need to be ruled out.  Recommendations: 1. Keppra 1000 mg IV loading dose, followed by 500 mg every 12 hours for maintenance management 2. MRI of the brain without contrast 3. EEG, routine adult study  We will continue to follow this patient with you  C.R. Nicole Kindred, MD [facility] [phone]  06/20/2016, 12:00 AM

## 2016-06-19 NOTE — Sedation Documentation (Signed)
24 at the Lip  Color change noted  Bilateral breath sounds noted. No sounds heard at the epigastric.

## 2016-06-19 NOTE — Sedation Documentation (Signed)
Pt sedated.  

## 2016-06-19 NOTE — Sedation Documentation (Signed)
OG tube inserted by Corrie Dandy

## 2016-06-19 NOTE — Sedation Documentation (Signed)
Pt is now 23 at the lip. RT adjusted based off of x-ray.

## 2016-06-19 NOTE — Sedation Documentation (Signed)
Port chest x-ray to bedside.

## 2016-06-19 NOTE — ED Provider Notes (Signed)
[facility] DEPT Provider Note   CSN: [id number] Arrival date & time: 06/19/16  2155     History   Chief Complaint Chief Complaint  Patient presents with  . Stroke Symptoms    HPI Kayla [name] is a 75 y.o. female.  HPI   Patient last seen normal at 9:30 AM. Patient able to walk with clear speech and had been to rehabilitation. Patient's family went to go check it at 7:30 found her head deviated, inability to walk. Patient had to be carried out of the car today.  Past Medical History:  Diagnosis Date  . Allergy    Shell fish  . Anxiety   . Arthritis    "knees, back, probably left hand" (01/14/2015)  . Asthmatic bronchitis   . Blind left eye   . Constipation   . Coronary artery disease   . Depression   . Diverticulosis of colon   . DJD (degenerative joint disease)   . Fibromyalgia   . GERD (gastroesophageal reflux disease)   . Heart murmur   . History of gout   . Hypercholesteremia   . Hypertension   . Personal history of noncompliance with medical treatment, presenting hazards to health   . Prolapse of vaginal vault after hysterectomy   . Proteinuria   . Sickle-cell trait ([place])   . Somatic dysfunction   . Stroke [facility]) ~ 08/2014   "blind in left eye; weak on left side since" (01/14/2015)  . Type II diabetes mellitus ([place])   . Venous insufficiency     Patient Active Problem List   Diagnosis Date Noted  . Allergic reaction 02/26/2016  . Blood in stool 02/26/2016  . Decreased strength, endurance, and mobility 09/25/2015  . Adjustment disorder with depressed mood 07/27/2015  . Left homonymous hemianopsia 09/06/2014  . Occipital infarction ([place]) 09/05/2014  . Acute ischemic right MCA stroke ([place]) 08/30/2014  . Acute right PCA stroke ([place]) 08/30/2014  . Abnormal chest x-ray 08/22/2014  . Type 2 diabetes mellitus with diabetic neuropathy ([place]) 01/04/2014  . HYPERCHOLESTEROLEMIA, MILD 05/22/2009  . PERS HX NONCOMPLIANCE W/MED TX PRS HAZARDS HLTH 07/11/2007    . Essential hypertension 05/04/2007  . Osteoarthritis 05/04/2007  . Fibromyalgia 05/04/2007    Past Surgical History:  Procedure Laterality Date  . ABDOMINAL HYSTERECTOMY    . CARDIAC CATHETERIZATION N/A 01/14/2015   Procedure: Left Heart Cath and Coronary Angiography;  Surgeon: Charolette Forward, MD;  Location: [place] CV LAB;  Service: Cardiovascular;  Laterality: N/A;  . CARDIAC CATHETERIZATION  ~ 2011  . CATARACT EXTRACTION W/ INTRAOCULAR LENS  IMPLANT, BILATERAL Bilateral 2012  . CORONARY ANGIOPLASTY    . DILATION AND CURETTAGE OF UTERUS  "probably"  . LOOP RECORDER IMPLANT N/A 09/03/2014   Procedure: LOOP RECORDER IMPLANT;  Surgeon: Thompson Grayer, MD;  Location: [facility] CATH LAB;  Service: Cardiovascular;  Laterality: N/A;  . ROBOTIC ASSISTED LAPAROSCOPIC SACROCOLPOPEXY  09/2009   Dr. Matilde Sprang  . TEE WITHOUT CARDIOVERSION N/A 09/02/2014   Procedure: TRANSESOPHAGEAL ECHOCARDIOGRAM (TEE);  Surgeon: Lelon Perla, MD;  Location: [facility] ENDOSCOPY;  Service: Cardiovascular;  Laterality: N/A;    OB History    No data available       Home Medications    Prior to Admission medications   Medication Sig Start Date End Date Taking? Authorizing Provider  aspirin 325 MG tablet Take 1 tablet (325 mg total) by mouth daily. 09/05/14   Reyne Dumas, MD  atenolol (TENORMIN) 50 MG tablet TK 1 T PO BID 11/03/15  Historical Provider, MD  atorvastatin (LIPITOR) 20 MG tablet Take 1 tablet (20 mg total) by mouth daily at 6 PM. 02/19/15   Hoyt Koch, MD  clopidogrel (PLAVIX) 75 MG tablet Take 75 mg by mouth daily. Reported on 06/06/2015    Historical Provider, MD  glucose blood (ACCU-CHEK AVIVA) test strip Check blood sugar once daily 09/23/14   Hoyt Koch, MD  isosorbide mononitrate (IMDUR) 60 MG 24 hr tablet Take 60 mg by mouth daily.  12/02/15   Historical Provider, MD  lisinopril (PRINIVIL,ZESTRIL) 40 MG tablet TK 1 T PO BID 11/05/15   Historical Provider, MD  metFORMIN (GLUCOPHAGE)  500 MG tablet TAKE 1 TABLET BY MOUTH TWICE DAILY WITH A MEAL 04/28/16   Hoyt Koch, MD  metFORMIN (GLUCOPHAGE) 500 MG tablet TAKE 1 TABLET BY MOUTH TWICE DAILY WITH A MEAL 05/27/16   Hoyt Koch, MD  metoprolol (LOPRESSOR) 50 MG tablet Take 50 mg by mouth 2 (two) times daily. Reported on 12/08/2015    Historical Provider, MD  mirtazapine (REMERON) 15 MG tablet TAKE 1 TABLET(15 MG) BY MOUTH AT BEDTIME 10/06/15   Hoyt Koch, MD  nitrofurantoin, macrocrystal-monohydrate, (MACROBID) 100 MG capsule Take 1 capsule (100 mg total) by mouth 2 (two) times daily. 02/27/16   Hoyt Koch, MD  nitroGLYCERIN (NITROSTAT) 0.4 MG SL tablet Place 0.4 mg under the tongue every 5 (five) minutes as needed for chest pain.    Historical Provider, MD  polyethylene glycol (MIRALAX / GLYCOLAX) packet Take 17 g by mouth daily as needed for moderate constipation.    Historical Provider, MD  predniSONE (DELTASONE) 20 MG tablet Take 1 tablet (20 mg total) by mouth daily with breakfast. 02/25/16   Hoyt Koch, MD    Family History Family History  Problem Relation Age of Onset  . Prostate cancer Father   . Hypertension Father   . Hypertension Sister   . Heart attack Neg Hx   . Stroke Neg Hx     Social History Social History  Substance Use Topics  . Smoking status: Never Smoker  . Smokeless tobacco: Never Used  . Alcohol use 0.0 oz/week     Comment: 01/14/2015 "might have a glass of wine a few times/yr"     Allergies   Amlodipine; Fish allergy; Penicillins; Shellfish allergy; Peanuts [peanut oil]; and Tomato   Review of Systems Review of Systems  Unable to perform ROS: Intubated     Physical Exam Updated Vital Signs BP (!) 231/113   Pulse 107   Temp 97.8 F (36.6 C) (Oral)   Resp 23   Ht 5\' 7"  (1.702 m)   Wt 180 lb (81.6 kg)   SpO2 100%   BMI 28.19 kg/m   Physical Exam  Constitutional: She appears well-developed and well-nourished.  HENT:  Head:  Normocephalic and atraumatic.  Eyes: Right eye exhibits no discharge. Left eye exhibits no discharge.  Cardiovascular:  No murmur heard. tachy  Pulmonary/Chest: Effort normal. She has no wheezes. She has no rales.  Abdominal: She exhibits no distension.  Neurological:  Post ictal  Skin: Skin is warm and dry. She is not diaphoretic.  Nursing note and vitals reviewed.    ED Treatments / Results  Labs (all labs ordered are listed, but only abnormal results are displayed) Labs Reviewed  CBG MONITORING, ED - Abnormal; Notable for the following:       Result Value   Glucose-Capillary 151 (*)    All other components within  normal limits  ETHANOL  PROTIME-INR  APTT  CBC  DIFFERENTIAL  COMPREHENSIVE METABOLIC PANEL  RAPID URINE DRUG SCREEN, HOSP PERFORMED  URINALYSIS, ROUTINE W REFLEX MICROSCOPIC  I-STAT CHEM 8, ED  I-STAT TROPOININ, ED  TYPE AND SCREEN    EKG  EKG Interpretation None       Radiology No results found.  Procedures Procedure Name: Intubation Date/Time: 06/19/2016 11:23 PM Performed by: Zenovia Jarred LYN Pre-anesthesia Checklist: Patient identified Oxygen Delivery Method: Non-rebreather mask Preoxygenation: Pre-oxygenation with 100% oxygen Intubation Type: Rapid sequence Ventilation: Mask ventilation without difficulty Laryngoscope Size: Mac, Glidescope and 3 Grade View: Grade III Tube size: 7.5 mm Number of attempts: 2 Placement Confirmation: ETT inserted through vocal cords under direct vision (difficulty pushign ET through cords by first provider, pressure done in second attempt, likely 7 tube would have be better. ) Secured at: 24 cm Tube secured with: ETT holder Dental Injury: Teeth and Oropharynx as per pre-operative assessment  Difficulty Due To: Difficulty was unanticipated Comments: Recommend 7.0 tube or smaller.       (including critical care time)  Medications Ordered in ED Medications  LORazepam (ATIVAN) 2 MG/ML injection  (not administered)  LORazepam (ATIVAN) tablet 2 mg (not administered)  etomidate (AMIDATE) injection (20 mg Intravenous Given 06/19/16 2237)  succinylcholine (ANECTINE) injection (150 mg Intravenous Given 06/19/16 2238)  propofol (DIPRIVAN) 1000 MG/100ML infusion (not administered)  fentaNYL (SUBLIMAZE) injection 50 mcg (not administered)  fentaNYL (SUBLIMAZE) injection 50 mcg (not administered)  propofol (DIPRIVAN) 1000 MG/100ML infusion (not administered)     Initial Impression / Assessment and Plan / ED Course  I have reviewed the triage vital signs and the nursing notes.  Pertinent labs & imaging results that were available during my care of the patient were reviewed by me and considered in my medical decision making (see chart for details).  Clinical Course    Patient with hypertension hyperlipidemia CAD, prior stroke, presenting with change in behavior since this morning. Last normal 9:30 this morning on arrival here patient not unable to move left arm or left leg. I was called to the patient's room while she was having a seizure. Generalized. Patient was postictal and did not wake up. Continued to have sonorous breathing. Patient was intubated for airway protection the patient to get expedited CT and further workup. Concern for hemorrhagic versus ischemic stroke. Blood pressure elevated.  11:22 PM No evidence of bleeding on CT. Pna. Discussed with family and she had left eye deviation and no strength in left leg seen prior to seizure. ? revisitation of stroke 2 years ago, vs new ischemic stroke.   Will admit to crit care and have nuerology follow.   CRITICAL CARE Performed by: Gardiner Sleeper Total critical care time: 30 minutes Critical care time was exclusive of separately billable procedures and treating other patients. Critical care was necessary to treat or prevent imminent or life-threatening deterioration. Critical care was time spent personally by me on the following  activities: development of treatment plan with patient and/or surrogate as well as nursing, discussions with consultants, evaluation of patient's response to treatment, examination of patient, obtaining history from patient or surrogate, ordering and performing treatments and interventions, ordering and review of laboratory studies, ordering and review of radiographic studies, pulse oximetry and re-evaluation of patient's condition.     Final Clinical Impressions(s) / ED Diagnoses   Final diagnoses:  None    New Prescriptions New Prescriptions   No medications on file     Courteney  Julio Alm, MD 06/20/16 [id number]

## 2016-06-19 NOTE — ED Notes (Addendum)
Pt alert and responding to this RN and family. Answering all questions appropriately. Pt stopped talking and tensed up. Pt left arm tensed up towards the body and pt started to seize. Pt mouth also tensed up

## 2016-06-19 NOTE — ED Notes (Signed)
Delay in lab draw pt not in room at this time 

## 2016-06-20 ENCOUNTER — Inpatient Hospital Stay (HOSPITAL_COMMUNITY): Payer: Medicare HMO

## 2016-06-20 DIAGNOSIS — I69398 Other sequelae of cerebral infarction: Secondary | ICD-10-CM | POA: Diagnosis not present

## 2016-06-20 DIAGNOSIS — I872 Venous insufficiency (chronic) (peripheral): Secondary | ICD-10-CM | POA: Diagnosis present

## 2016-06-20 DIAGNOSIS — M9909 Segmental and somatic dysfunction of abdomen and other regions: Secondary | ICD-10-CM | POA: Diagnosis present

## 2016-06-20 DIAGNOSIS — G9340 Encephalopathy, unspecified: Secondary | ICD-10-CM | POA: Diagnosis not present

## 2016-06-20 DIAGNOSIS — R262 Difficulty in walking, not elsewhere classified: Secondary | ICD-10-CM | POA: Diagnosis present

## 2016-06-20 DIAGNOSIS — N189 Chronic kidney disease, unspecified: Secondary | ICD-10-CM | POA: Diagnosis present

## 2016-06-20 DIAGNOSIS — J9601 Acute respiratory failure with hypoxia: Secondary | ICD-10-CM | POA: Diagnosis not present

## 2016-06-20 DIAGNOSIS — E7800 Pure hypercholesterolemia, unspecified: Secondary | ICD-10-CM | POA: Diagnosis present

## 2016-06-20 DIAGNOSIS — F329 Major depressive disorder, single episode, unspecified: Secondary | ICD-10-CM | POA: Diagnosis present

## 2016-06-20 DIAGNOSIS — D573 Sickle-cell trait: Secondary | ICD-10-CM | POA: Diagnosis present

## 2016-06-20 DIAGNOSIS — E785 Hyperlipidemia, unspecified: Secondary | ICD-10-CM | POA: Diagnosis present

## 2016-06-20 DIAGNOSIS — J45909 Unspecified asthma, uncomplicated: Secondary | ICD-10-CM | POA: Diagnosis present

## 2016-06-20 DIAGNOSIS — J189 Pneumonia, unspecified organism: Secondary | ICD-10-CM | POA: Diagnosis not present

## 2016-06-20 DIAGNOSIS — I129 Hypertensive chronic kidney disease with stage 1 through stage 4 chronic kidney disease, or unspecified chronic kidney disease: Secondary | ICD-10-CM | POA: Diagnosis present

## 2016-06-20 DIAGNOSIS — K219 Gastro-esophageal reflux disease without esophagitis: Secondary | ICD-10-CM | POA: Diagnosis present

## 2016-06-20 DIAGNOSIS — R4781 Slurred speech: Secondary | ICD-10-CM | POA: Diagnosis not present

## 2016-06-20 DIAGNOSIS — N183 Chronic kidney disease, stage 3 (moderate): Secondary | ICD-10-CM | POA: Diagnosis not present

## 2016-06-20 DIAGNOSIS — R569 Unspecified convulsions: Secondary | ICD-10-CM

## 2016-06-20 DIAGNOSIS — I2510 Atherosclerotic heart disease of native coronary artery without angina pectoris: Secondary | ICD-10-CM | POA: Diagnosis not present

## 2016-06-20 DIAGNOSIS — J9600 Acute respiratory failure, unspecified whether with hypoxia or hypercapnia: Secondary | ICD-10-CM | POA: Diagnosis not present

## 2016-06-20 DIAGNOSIS — R4182 Altered mental status, unspecified: Secondary | ICD-10-CM | POA: Diagnosis not present

## 2016-06-20 DIAGNOSIS — I638 Other cerebral infarction: Secondary | ICD-10-CM | POA: Diagnosis not present

## 2016-06-20 DIAGNOSIS — M797 Fibromyalgia: Secondary | ICD-10-CM | POA: Diagnosis present

## 2016-06-20 DIAGNOSIS — E1122 Type 2 diabetes mellitus with diabetic chronic kidney disease: Secondary | ICD-10-CM | POA: Diagnosis not present

## 2016-06-20 DIAGNOSIS — E872 Acidosis: Secondary | ICD-10-CM | POA: Diagnosis not present

## 2016-06-20 DIAGNOSIS — R9401 Abnormal electroencephalogram [EEG]: Secondary | ICD-10-CM | POA: Diagnosis not present

## 2016-06-20 DIAGNOSIS — G9349 Other encephalopathy: Secondary | ICD-10-CM | POA: Diagnosis not present

## 2016-06-20 DIAGNOSIS — H538 Other visual disturbances: Secondary | ICD-10-CM | POA: Diagnosis present

## 2016-06-20 LAB — URINALYSIS, ROUTINE W REFLEX MICROSCOPIC
BILIRUBIN URINE: NEGATIVE
Glucose, UA: 150 mg/dL — AB
Hgb urine dipstick: NEGATIVE
Ketones, ur: NEGATIVE mg/dL
Leukocytes, UA: NEGATIVE
Nitrite: NEGATIVE
SPECIFIC GRAVITY, URINE: 1.011 (ref 1.005–1.030)
SQUAMOUS EPITHELIAL / LPF: NONE SEEN
pH: 5 (ref 5.0–8.0)

## 2016-06-20 LAB — RAPID URINE DRUG SCREEN, HOSP PERFORMED
Amphetamines: NOT DETECTED
BARBITURATES: NOT DETECTED
BENZODIAZEPINES: NOT DETECTED
COCAINE: NOT DETECTED
OPIATES: NOT DETECTED
TETRAHYDROCANNABINOL: NOT DETECTED

## 2016-06-20 LAB — BASIC METABOLIC PANEL
Anion gap: 11 (ref 5–15)
BUN: 17 mg/dL (ref 6–20)
CALCIUM: 8.7 mg/dL — AB (ref 8.9–10.3)
CO2: 18 mmol/L — AB (ref 22–32)
CREATININE: 1.4 mg/dL — AB (ref 0.44–1.00)
Chloride: 111 mmol/L (ref 101–111)
GFR calc Af Amer: 41 mL/min — ABNORMAL LOW (ref >60)
GFR calc non Af Amer: 35 mL/min — ABNORMAL LOW (ref >60)
GLUCOSE: 169 mg/dL — AB (ref 65–99)
Potassium: 4.5 mmol/L (ref 3.5–5.1)
Sodium: 140 mmol/L (ref 135–145)

## 2016-06-20 LAB — CBC WITH DIFFERENTIAL/PLATELET
BASOS ABS: 0 10*3/uL (ref 0.0–0.1)
Basophils Relative: 0 %
Eosinophils Absolute: 0 10*3/uL (ref 0.0–0.7)
Eosinophils Relative: 0 %
HEMATOCRIT: 42.4 % (ref 36.0–46.0)
Hemoglobin: 13.4 g/dL (ref 12.0–15.0)
LYMPHS PCT: 8 %
Lymphs Abs: 0.8 10*3/uL (ref 0.7–4.0)
MCH: 27.2 pg (ref 26.0–34.0)
MCHC: 31.6 g/dL (ref 30.0–36.0)
MCV: 86.2 fL (ref 78.0–100.0)
MONOS PCT: 6 %
Monocytes Absolute: 0.6 10*3/uL (ref 0.1–1.0)
NEUTROS ABS: 8.8 10*3/uL — AB (ref 1.7–7.7)
Neutrophils Relative %: 86 %
Platelets: 243 10*3/uL (ref 150–400)
RBC: 4.92 MIL/uL (ref 3.87–5.11)
RDW: 16.5 % — AB (ref 11.5–15.5)
WBC: 10.2 10*3/uL (ref 4.0–10.5)

## 2016-06-20 LAB — I-STAT ARTERIAL BLOOD GAS, ED
ACID-BASE DEFICIT: 9 mmol/L — AB (ref 0.0–2.0)
Bicarbonate: 17.9 mmol/L — ABNORMAL LOW (ref 20.0–28.0)
O2 SAT: 97 %
PCO2 ART: 40.1 mmHg (ref 32.0–48.0)
PH ART: 7.256 — AB (ref 7.350–7.450)
PO2 ART: 108 mmHg (ref 83.0–108.0)
Patient temperature: 97.8
TCO2: 19 mmol/L (ref 0–100)

## 2016-06-20 LAB — GLUCOSE, CAPILLARY
GLUCOSE-CAPILLARY: 114 mg/dL — AB (ref 65–99)
GLUCOSE-CAPILLARY: 126 mg/dL — AB (ref 65–99)
Glucose-Capillary: 92 mg/dL (ref 65–99)
Glucose-Capillary: 116 mg/dL — ABNORMAL HIGH (ref 65–99)
Glucose-Capillary: 126 mg/dL — ABNORMAL HIGH (ref 65–99)

## 2016-06-20 LAB — PHOSPHORUS: Phosphorus: 3.7 mg/dL (ref 2.5–4.6)

## 2016-06-20 LAB — MRSA PCR SCREENING: MRSA by PCR: NEGATIVE

## 2016-06-20 LAB — ETHANOL: Alcohol, Ethyl (B): <5 mg/dL (ref <5)

## 2016-06-20 LAB — PROCALCITONIN: Procalcitonin: <0.1 ng/mL

## 2016-06-20 LAB — TRIGLYCERIDES: Triglycerides: 94 mg/dL (ref <150)

## 2016-06-20 LAB — MAGNESIUM: Magnesium: 1.9 mg/dL (ref 1.7–2.4)

## 2016-06-20 MED ORDER — PROPOFOL 1000 MG/100ML IV EMUL
0.0000 ug/kg/min | INTRAVENOUS | Status: DC
  Administered 2016-06-20: 15 ug/kg/min via INTRAVENOUS
  Administered 2016-06-20: 20 ug/kg/min via INTRAVENOUS
  Administered 2016-06-21: 30 ug/kg/min via INTRAVENOUS
  Filled 2016-06-20 (×2): qty 100
  Filled 2016-06-20: qty 200

## 2016-06-20 MED ORDER — ENOXAPARIN SODIUM 40 MG/0.4ML ~~LOC~~ SOLN
40.0000 mg | SUBCUTANEOUS | Status: DC
  Administered 2016-06-20 – 2016-06-25 (×6): 40 mg via SUBCUTANEOUS
  Filled 2016-06-20 (×6): qty 0.4

## 2016-06-20 MED ORDER — INSULIN ASPART 100 UNIT/ML ~~LOC~~ SOLN: 0.0000 [IU] | Freq: Three times a day (TID) | SUBCUTANEOUS | Status: DC

## 2016-06-20 MED ORDER — CHLORHEXIDINE GLUCONATE 0.12% ORAL RINSE (MEDLINE KIT)
15.0000 mL | Freq: Two times a day (BID) | OROMUCOSAL | Status: DC
  Administered 2016-06-20 – 2016-06-21 (×3): 15 mL via OROMUCOSAL

## 2016-06-20 MED ORDER — FENTANYL CITRATE (PF) 100 MCG/2ML IJ SOLN: 50.0000 ug | INTRAMUSCULAR | Status: DC | PRN

## 2016-06-20 MED ORDER — LABETALOL HCL 5 MG/ML IV SOLN
10.0000 mg | INTRAVENOUS | Status: DC | PRN
  Administered 2016-06-20 – 2016-06-22 (×4): 10 mg via INTRAVENOUS
  Filled 2016-06-20 (×4): qty 4

## 2016-06-20 MED ORDER — ORAL CARE MOUTH RINSE
15.0000 mL | OROMUCOSAL | Status: DC
  Administered 2016-06-20 – 2016-06-21 (×13): 15 mL via OROMUCOSAL

## 2016-06-20 MED ORDER — SODIUM CHLORIDE 0.9 % IV SOLN
INTRAVENOUS | Status: DC
  Administered 2016-06-20: 1000 mL via INTRAVENOUS

## 2016-06-20 MED ORDER — PROPOFOL 1000 MG/100ML IV EMUL
5.0000 ug/kg/min | Freq: Once | INTRAVENOUS | Status: DC
  Filled 2016-06-20: qty 100

## 2016-06-20 MED ORDER — FAMOTIDINE IN NACL 20-0.9 MG/50ML-% IV SOLN
20.0000 mg | Freq: Two times a day (BID) | INTRAVENOUS | Status: DC
  Administered 2016-06-20 – 2016-06-21 (×5): 20 mg via INTRAVENOUS
  Filled 2016-06-20 (×5): qty 50

## 2016-06-20 MED ORDER — SODIUM CHLORIDE 0.9 % IV SOLN
1000.0000 mg | Freq: Once | INTRAVENOUS | Status: AC
  Administered 2016-06-20: 1000 mg via INTRAVENOUS
  Filled 2016-06-20: qty 10

## 2016-06-20 MED ORDER — INSULIN ASPART 100 UNIT/ML ~~LOC~~ SOLN: 0.0000 [IU] | SUBCUTANEOUS | Status: DC

## 2016-06-20 MED ORDER — FENTANYL CITRATE (PF) 100 MCG/2ML IJ SOLN
50.0000 ug | INTRAMUSCULAR | Status: DC | PRN
  Administered 2016-06-20: 50 ug via INTRAVENOUS
  Filled 2016-06-20: qty 2

## 2016-06-20 MED ORDER — SODIUM CHLORIDE 0.9 % IV SOLN
500.0000 mg | Freq: Two times a day (BID) | INTRAVENOUS | Status: DC
  Administered 2016-06-20 – 2016-06-23 (×6): 500 mg via INTRAVENOUS
  Filled 2016-06-20 (×9): qty 5

## 2016-06-20 NOTE — Progress Notes (Signed)
S/O: Agitated requiring propofol sedation.   Vitals at time of AM assessment: HR 87, R 16, BP 176/104, O2 Sat 100%  Neurological Exam: Performed while propofol was held. Intubated. Agitated. Eyes open. PERRL. Roving EOM. Does not fixate on visual stimuli. Face symmetric. Responds weakly to about 10% of verbal commands. Does not attempt to communicate. Thrashes upper extremities bilaterally spontaneously. Withdraws lower extremities to noxious stimuli.   Assessment/Plan 75 year old female with a history of diabetes mellitus, hypertension, hyperlipidemia and previous right cerebral infarctions presenting with new onset focal and generalized seizure activity. CT scan of her head was unremarkable. Acute recurrent stroke will need to be ruled out. 1. EEG revealed no electrographic seizure activity.  2. Continue Keppra 500 mg every 12 hours for maintenance management 3. MRI of the brain revealed no acute infarction. Chronic right middle cerebral artery and right posterior cerebral artery territory infarcts are noted. Findings consistent with moderate chronic small vessel ischemic disease is also seen. 4. Short term goal is to wean off sedation and the vent. Medium term goal is OOB to chair.  5. PRN labetalol given for elevated BP. 6. Discussed with family.  Electronically signed: Dr. Kerney Elbe

## 2016-06-20 NOTE — Consult Note (Signed)
PULMONARY / CRITICAL CARE MEDICINE   Name: Kayla [name] MRN: [id number] DOB: 06/28/1940    ADMISSION DATE:  06/19/2016 CONSULTATION DATE:  @TODAY @   REFERRING MD:   CHIEF COMPLAINT:  Acute encephalopathy, witnessed seizure  HISTORY OF PRESENT ILLNESS:   Kayla [name] is a 75 year old female with a history of CVA in March of 2016, CAD with DES presents for acute encephalopathy.  The patient lives with her husband who has the flu currently. She was well when visited by her daughter yesterday morning. During the evening visit, her daughter noticed she was leaning to her left with a left gaze preference. The patient did demonstrate some slurred speech when she took a phone call from another family member. She then walked across the room and was noticed to been dragging her left foot. Family contacted her PCP who recommend ED evaluation.  In the ED she was witness to have a seizure, intubated for airway proctection. Neurology was consulted and recommended Keppra load, MRI with and without contrast.   Upon my arrival, the patient is intubated, sedated with propofol.   History is obtained from daughter and grand daughter who are present at bedside. According to the daughter, the patient has not had any new medication changes. She does not currently have her medication list, but will bring it tomorrow.   The patient is a never smoker, formerly worked in a Research officer, trade union.   PAST MEDICAL HISTORY :  She  has a past medical history of Allergy; Anxiety; Arthritis; Asthmatic bronchitis; Blind left eye; Constipation; Coronary artery disease; Depression; Diverticulosis of colon; DJD (degenerative joint disease); Fibromyalgia; GERD (gastroesophageal reflux disease); Heart murmur; History of gout; Hypercholesteremia; Hypertension; Personal history of noncompliance with medical treatment, presenting hazards to health; Prolapse of vaginal vault after hysterectomy; Proteinuria; Sickle-cell trait ([place]);  Somatic dysfunction; Stroke ([place]) (~ 08/2014); Type II diabetes mellitus ([place]); and Venous insufficiency.  PAST SURGICAL HISTORY: She  has a past surgical history that includes Abdominal hysterectomy; Robotic assisted laparoscopic sacrocolpopexy (09/2009); Cataract extraction w/ intraocular lens  implant, bilateral (Bilateral, 2012); TEE without cardioversion (N/A, 09/02/2014); loop recorder implant (N/A, 09/03/2014); Cardiac catheterization (N/A, 01/14/2015); Cardiac catheterization (~ 2011); Dilation and curettage of uterus ("probably"); and Coronary angioplasty.  Allergies  Allergen Reactions  . Amlodipine Shortness Of Breath and Rash  . Fish Allergy Hives  . Penicillins Other (See Comments)    REACTION: red rash  . Shellfish Allergy Hives  . Peanuts [Peanut Oil] Rash  . Tomato Rash    No current facility-administered medications on file prior to encounter.    Current Outpatient Prescriptions on File Prior to Encounter  Medication Sig  . aspirin 325 MG tablet Take 1 tablet (325 mg total) by mouth daily.  [name] atenolol (TENORMIN) 50 MG tablet TK 1 T PO BID  . atorvastatin (LIPITOR) 20 MG tablet Take 1 tablet (20 mg total) by mouth daily at 6 PM.  . clopidogrel (PLAVIX) 75 MG tablet Take 75 mg by mouth daily. Reported on 06/06/2015  . glucose blood (ACCU-CHEK AVIVA) test strip Check blood sugar once daily  . isosorbide mononitrate (IMDUR) 60 MG 24 hr tablet Take 60 mg by mouth daily.   [name] lisinopril (PRINIVIL,ZESTRIL) 40 MG tablet TK 1 T PO BID  . metFORMIN (GLUCOPHAGE) 500 MG tablet TAKE 1 TABLET BY MOUTH TWICE DAILY WITH A MEAL  . metFORMIN (GLUCOPHAGE) 500 MG tablet TAKE 1 TABLET BY MOUTH TWICE DAILY WITH A MEAL  . metoprolol (LOPRESSOR) 50 MG tablet  Take 50 mg by mouth 2 (two) times daily. Reported on 12/08/2015  . mirtazapine (REMERON) 15 MG tablet TAKE 1 TABLET(15 MG) BY MOUTH AT BEDTIME  . nitrofurantoin, macrocrystal-monohydrate, (MACROBID) 100 MG capsule Take 1 capsule (100 mg total)  by mouth 2 (two) times daily.  . nitroGLYCERIN (NITROSTAT) 0.4 MG SL tablet Place 0.4 mg under the tongue every 5 (five) minutes as needed for chest pain.  . polyethylene glycol (MIRALAX / GLYCOLAX) packet Take 17 g by mouth daily as needed for moderate constipation.  . predniSONE (DELTASONE) 20 MG tablet Take 1 tablet (20 mg total) by mouth daily with breakfast.    FAMILY HISTORY:  Her indicated that her mother is deceased. She indicated that her father is deceased. She indicated that one of her two sisters is deceased. She indicated that her brother is deceased. She indicated that the status of her neg hx is unknown.    SOCIAL HISTORY: She  reports that she has never smoked. She has never used smokeless tobacco. She reports that she drinks alcohol. She reports that she does not use drugs.  REVIEW OF SYSTEMS:   Unable to obtain 2/2 intubated, sedated status.   VITAL SIGNS: BP (!) 186/104   Pulse 120   Temp 97.8 F (36.6 C) (Oral)   Resp 25   Ht 5\' 7"  (1.702 m)   Wt 180 lb (81.6 kg)   SpO2 99%   BMI 28.19 kg/m   HEMODYNAMICS:  stable  VENTILATOR SETTINGS:   Vent Mode: PRVC FiO2 (%):  [60 %] 60 % Set Rate:  [16 bmp] 16 bmp Vt Set:  [500 mL] 500 mL PEEP:  [5 cmH20] 5 cmH20 Plateau Pressure:  [19 cmH20] 19 cmH20  INTAKE / OUTPUT: No intake/output data recorded.  PHYSICAL EXAMINATION: Physical Exam: Temp:  [97.8 F (36.6 C)] 97.8 F (36.6 C) (12/30 2212) Pulse Rate:  [87-132] 120 (12/30 2257) Resp:  [14-29] 25 (12/30 2257) BP: (186-267)/(97-151) 186/104 (12/30 2257) SpO2:  [94 %-100 %] 99 % (12/30 2257) FiO2 (%):  [60 %] 60 % (12/30 2240) Weight:  [180 lb (81.6 kg)] 180 lb (81.6 kg) (12/30 2220)  General Intubated, sedated, in no apparent distress  HEENT No gross abnormalities. ETT present   Pulmonary Clear to auscultation bilaterally with no wheezes, rales or ronchi. Good effort, symmetrical expansion.   Cardiovascular Normal rate, regular rhythm. S1, s2. Distal  pulses palpable.  Abdomen Soft, non-tender, non-distended, positive bowel sounds  Neurologic Intubated, sedated, not responsive to verbal command, minimally responsive to noxious stimuli  Skin/Integuement No rash, no cyanosis, no clubbing.    LABS:  BMET  Recent Labs Lab 06/19/16 2235 06/19/16 2255  NA 141 142  K 4.5 4.4  CL 110 109  CO2 16*  --   BUN 17 20  CREATININE 1.40* 1.40*  GLUCOSE 144* 153*    Electrolytes  Recent Labs Lab 06/19/16 2235  CALCIUM 8.7*    CBC  Recent Labs Lab 06/19/16 2235 06/19/16 2255  WBC 8.5  --   HGB 13.4 13.9  HCT 40.4 41.0  PLT 252  --     Coag's  Recent Labs Lab 06/19/16 2235  APTT 27  INR 1.01    Sepsis Markers No results for input(s): LATICACIDVEN, PROCALCITON, O2SATVEN in the last 168 hours.  ABG  Recent Labs Lab 06/20/16 0026  PHART 7.256*  PCO2ART 40.1  PO2ART 108.0    Liver Enzymes  Recent Labs Lab 06/19/16 2235  AST 18  ALT 13*  ALKPHOS 72  BILITOT 0.3  ALBUMIN 3.6    Cardiac Enzymes No results for input(s): TROPONINI, PROBNP in the last 168 hours.  Glucose  Recent Labs Lab 06/19/16 2204  GLUCAP 151*    Imaging Ct Head Wo Contrast  Result Date: 06/19/2016 CLINICAL DATA:  Patient with seizure.  Altered mental status. EXAM: CT HEAD WITHOUT CONTRAST TECHNIQUE: Contiguous axial images were obtained from the base of the skull through the vertex without intravenous contrast. COMPARISON:  CT brain 09/01/2014. FINDINGS: Brain: Ventricles and sulci are prominent. Encephalomalacia demonstrated within the right occipital lobe. Old right frontal periventricular infarct. Extensive low-attenuation throughout the right cerebral hemisphere white matter. No evidence for intracranial hemorrhage, mass lesion or mass-effect. Vascular: Internal carotid arterial vascular calcifications. Skull: Intact. Sinuses/Orbits: Mastoid air cells unremarkable. Paranasal sinuses are well aerated. Other: None. IMPRESSION:  Encephalomalacia within the right frontal, temporal and occipital lobes compatible with remote infarct. No acute intracranial hemorrhage, mass lesion or mass-effect. Electronically Signed   By: Lovey Newcomer M.D.   On: 06/19/2016 23:28   Dg Chest Portable 1 View  Result Date: 06/19/2016 CLINICAL DATA:  Post intubation.  Respiratory failure. EXAM: PORTABLE CHEST 1 VIEW COMPARISON:  08/30/2014 CXR FINDINGS: Endotracheal tube tip is 3.2 cm above the carina in satisfactory position. Mild vascular congestion consistent with CHF. Cardiomegaly is noted. Aorta is tortuous. Patchy airspace opacities at the lung bases cannot exclude pneumonia. No suspicious osseous abnormality. IMPRESSION: Endotracheal tube tip in satisfactory position 3.2 cm above the carina. Mild CHF. Patchy airspace disease in both lower lobes cannot exclude pneumonia. Electronically Signed   By: Ashley Royalty M.D.   On: 06/19/2016 23:15    Cardiac Cath 12/2014 Ost RCA lesion, 40% stenosed. Prox RCA to Mid RCA lesion, 20% stenosed. Dist RCA lesion, 15% stenosed. Ost 1st Diag lesion, 40% stenosed. 1st Diag lesion, 90% stenosed. There is a 0% residual stenosis post intervention. A drug-eluting stent was placed. Dist LAD-2 lesion, 85% stenosed. Dist LAD-1 lesion, 70% stenosed. There is a 0% residual stenosis post intervention.A drug-eluting stent was placed.  CULTURES: Tracheal aspirate pending  ANTIBIOTICS: levaquin  LINES/TUBES: PIV, foley, ETT  DISCUSSION: 75 year old female with history of ischemic CVA, presents with symptoms of new CVA with left sided weakness, gaze preference and witnessed seizure.No new medication changes or trauma.  ASSESSMENT / PLAN:  PULMONARY A: Acute respiratory failure, intubated for airway protection due to seizure activity Initially Levaquin added for possible pneumonia P:   Maintain intubated status at this time Continue propofol to maintain RAAS 0 Daily sedation vacation Hold antibiotics at  this time, in light of no leukocytosis or fever. Will check procalcitonin now  CARDIOVASCULAR A: history of coronary ischemia s/p DES, see above P:  Await home medication list Hold further anticoagulation tonight  RENAL A:  Renal indices in tact P:   Monitor with daily BMP Continue foley catheter  GASTROINTESTINAL A:  No GI complaints P:  Hold tube feeds tonight  HEMATOLOGIC A:  No coagulopathy  P:  Monitor CBC daily  INFECTIOUS A:  Abnormal CXR could indicate pulmonary edema, viral illness or aspiration P:   S/p levaquin Will hold additional antibiotics and add procalcitonin  ENDOCRINE A:  History of DM  P:   Add Sliding Scale Insulin  NEUROLOGIC A:  Acute Encephalopathy with seizure activity Given clinical history obtained from family, the most likely etiology is new stroke P:   Neurology following, keppra ordered, MRI brain ordered Will continue to monitor with neurochecks  q4 hours RASS goal: 0  FAMILY  - Updates: present at bedside. FULL CODE at this time.  - Inter-disciplinary family meet or Palliative Care meeting due by:  day 7   The patient is critically ill with multiple organ system failure and requires high complexity decision making for assessment and support, frequent evaluation and titration of therapies, advanced monitoring, review of radiographic studies and interpretation of complex data.   Critical Care Time devoted to patient care services, exclusive of separately billable procedures, described in this note is 30  minutes.   Kayla Sis DO [facility] [place] Pager: [phone]  06/20/2016, 12:31 AM

## 2016-06-20 NOTE — Progress Notes (Signed)
SLP Cancellation Note  Patient Details Name: Kayla [name] MRN: [id number] DOB: Jan 03, 1941   Cancelled treatment:       Reason Eval/Treat Not Completed: Patient not medically ready. Pt intubated   Kaleth Koy, Katherene Ponto 06/20/2016, 7:51 AM

## 2016-06-20 NOTE — Progress Notes (Signed)
STAT EEG completed; results pending. Dr Cheral Marker and Dr Gifford Shave notified.

## 2016-06-20 NOTE — Progress Notes (Signed)
PULMONARY / CRITICAL CARE MEDICINE   Name: Kayla [name] MRN: [id number] DOB: May 19, 1941    ADMISSION DATE:  06/19/2016 CONSULTATION DATE:  @TODAY @   REFERRING MD:   CHIEF COMPLAINT:  Acute encephalopathy, witnessed seizure  HISTORY OF PRESENT ILLNESS:   Kayla [name] is a 75 year old female with a history of CVA in March of 2016, CAD with DES presents for acute encephalopathy.  The patient lives with her husband who has the flu currently. She was well when visited by her daughter yesterday morning. During the evening visit, her daughter noticed she was leaning to her left with a left gaze preference. The patient did demonstrate some slurred speech when she took a phone call from another family member. She then walked across the room and was noticed to been dragging her left foot. Family contacted her PCP who recommend ED evaluation.  In the ED she was witness to have a seizure, intubated for airway proctection. Neurology was consulted and recommended Keppra load, MRI with and without contrast.   Upon my arrival, the patient is intubated, sedated with propofol.   History is obtained from daughter and grand daughter who are present at bedside. According to the daughter, the patient has not had any new medication changes.   The patient is a never smoker, formerly worked in a Research officer, trade union.    Subjective / Interval events:  MRI performed today.   VITAL SIGNS: BP 138/71   Pulse 79   Temp 99.2 F (37.3 C) (Axillary)   Resp 16   Ht 5\' 7"  (1.702 m)   Wt 84.5 kg (186 lb 4.6 oz)   SpO2 100%   BMI 29.18 kg/m   HEMODYNAMICS:  stable  VENTILATOR SETTINGS: Vent Mode: PSV;CPAP FiO2 (%):  [40 %-60 %] 40 % Set Rate:  [16 bmp] 16 bmp Vt Set:  [500 mL] 500 mL PEEP:  [5 cmH20] 5 cmH20 Pressure Support:  [8 cmH20] 8 cmH20 Plateau Pressure:  [16 cmH20-19 cmH20] 19 cmH20 Vent Mode: PSV;CPAP FiO2 (%):  [40 %-60 %] 40 % Set Rate:  [16 bmp] 16 bmp Vt Set:  [500 mL] 500 mL PEEP:  [5  cmH20] 5 cmH20 Pressure Support:  [8 cmH20] 8 cmH20 Plateau Pressure:  [16 cmH20-19 cmH20] 19 cmH20  INTAKE / OUTPUT: I/O last 3 completed shifts: In: 268.8 [I.V.:68.8; Other:150; IV Piggyback:50] Out: 160 [Urine:160]  PHYSICAL EXAMINATION: Physical Exam: Temp:  [97.8 F (36.6 C)-99.2 F (37.3 C)] 99.2 F (37.3 C) (12/31 1200) Pulse Rate:  [35-132] 79 (12/31 1310) Resp:  [14-29] 16 (12/31 1200) BP: (128-267)/(71-184) 138/71 (12/31 1200) SpO2:  [91 %-100 %] 100 % (12/31 1310) FiO2 (%):  [40 %-60 %] 40 % (12/31 1310) Weight:  [81.6 kg (180 lb)-84.5 kg (186 lb 4.6 oz)] 84.5 kg (186 lb 4.6 oz) (12/31 0246)  General Intubated, sedated, in no apparent distress  HEENT No gross abnormalities. ETT present   Pulmonary Clear to auscultation bilaterally with no wheezes, rales or ronchi. Good effort, symmetrical expansion.   Cardiovascular Normal rate, regular rhythm. S1, s2. Distal pulses palpable.  Abdomen Soft, non-tender, non-distended, positive bowel sounds  Neurologic Intubated, sedated, not responsive to verbal command, minimally responsive to noxious stimuli  Skin/Integuement No rash, no cyanosis, no clubbing.    LABS:  BMET  Recent Labs Lab 06/19/16 2235 06/19/16 2255 06/20/16 0157  NA 141 142 140  K 4.5 4.4 4.5  CL 110 109 111  CO2 16*  --  18*  BUN 17 20  17  CREATININE 1.40* 1.40* 1.40*  GLUCOSE 144* 153* 169*    Electrolytes  Recent Labs Lab 06/19/16 2235 06/20/16 0157  CALCIUM 8.7* 8.7*  MG  --  1.9  PHOS  --  3.7    CBC  Recent Labs Lab 06/19/16 2235 06/19/16 2255 06/20/16 0157  WBC 8.5  --  10.2  HGB 13.4 13.9 13.4  HCT 40.4 41.0 42.4  PLT 252  --  243    Coag's  Recent Labs Lab 06/19/16 2235  APTT 27  INR 1.01    Sepsis Markers  Recent Labs Lab 06/20/16 0158  PROCALCITON <0.10    ABG  Recent Labs Lab 06/20/16 0026  PHART 7.256*  PCO2ART 40.1  PO2ART 108.0    Liver Enzymes  Recent Labs Lab 06/19/16 2235  AST 18   ALT 13*  ALKPHOS 72  BILITOT 0.3  ALBUMIN 3.6    Cardiac Enzymes No results for input(s): TROPONINI, PROBNP in the last 168 hours.  Glucose  Recent Labs Lab 06/19/16 2204 06/20/16 0426 06/20/16 0728  GLUCAP 151* 114* 92    Imaging Ct Head Wo Contrast  Result Date: 06/19/2016 CLINICAL DATA:  Patient with seizure.  Altered mental status. EXAM: CT HEAD WITHOUT CONTRAST TECHNIQUE: Contiguous axial images were obtained from the base of the skull through the vertex without intravenous contrast. COMPARISON:  CT brain 09/01/2014. FINDINGS: Brain: Ventricles and sulci are prominent. Encephalomalacia demonstrated within the right occipital lobe. Old right frontal periventricular infarct. Extensive low-attenuation throughout the right cerebral hemisphere white matter. No evidence for intracranial hemorrhage, mass lesion or mass-effect. Vascular: Internal carotid arterial vascular calcifications. Skull: Intact. Sinuses/Orbits: Mastoid air cells unremarkable. Paranasal sinuses are well aerated. Other: None. IMPRESSION: Encephalomalacia within the right frontal, temporal and occipital lobes compatible with remote infarct. No acute intracranial hemorrhage, mass lesion or mass-effect. Electronically Signed   By: Lovey Newcomer M.D.   On: 06/19/2016 23:28   Mr Brain Wo Contrast  Result Date: 06/20/2016 CLINICAL DATA:  Altered mental status, last seen well yesterday morning. Slurred speech, LEFT-sided weakness. History of stroke, hypertension, diabetes. EXAM: MRI HEAD WITHOUT CONTRAST TECHNIQUE: Multiplanar, multiecho pulse sequences of the brain and surrounding structures were obtained without intravenous contrast. COMPARISON:  CT HEAD June 19, 2016 and MRI of the head August 30, 2014 FINDINGS: BRAIN: No reduced diffusion to suggest acute ischemia. No susceptibility artifact to suggest hemorrhage. RIGHT temporal occipital lobe encephalomalacia. RIGHT frontal lobe encephalomalacia. Mild ex vacuo  dilatation RIGHT atrium, ventricles and sulci are overall normal for patient's age. Patchy to confluent supratentorial pontine white matter FLAIR T2 hyperintensities. Patchy FLAIR T2 hyperintense signal in the basal ganglia and thalamic. Old RIGHT basal ganglia infarct. Asymmetrically smaller RIGHT cerebral peduncle compatible with wallerian degeneration. No intraparenchymal mass or mass effect. No abnormal extra-axial fluid collections. Symmetric size, morphology and signal of the hippocampi. VASCULAR: Normal major intracranial vascular flow voids present at skull base. SKULL AND UPPER CERVICAL SPINE: No abnormal sellar expansion. No suspicious calvarial bone marrow signal. Craniocervical junction maintained. SINUSES/ORBITS: The mastoid air-cells and included paranasal sinuses are well-aerated. Status post bilateral ocular lens implants. The included ocular globes and orbital contents are non-suspicious. OTHER: Patient is edentulous.  Life-support lines in place. IMPRESSION: No acute intracranial process. Old RIGHT middle cerebral artery and RIGHT posterior cerebral artery territory infarcts. Moderate chronic small vessel ischemic disease. Electronically Signed   By: Elon Alas M.D.   On: 06/20/2016 06:24   Dg Chest Portable 1 View  Result Date: 06/19/2016  CLINICAL DATA:  Post intubation.  Respiratory failure. EXAM: PORTABLE CHEST 1 VIEW COMPARISON:  08/30/2014 CXR FINDINGS: Endotracheal tube tip is 3.2 cm above the carina in satisfactory position. Mild vascular congestion consistent with CHF. Cardiomegaly is noted. Aorta is tortuous. Patchy airspace opacities at the lung bases cannot exclude pneumonia. No suspicious osseous abnormality. IMPRESSION: Endotracheal tube tip in satisfactory position 3.2 cm above the carina. Mild CHF. Patchy airspace disease in both lower lobes cannot exclude pneumonia. Electronically Signed   By: Ashley Royalty M.D.   On: 06/19/2016 23:15    Cardiac Cath 12/2014 Ost RCA  lesion, 40% stenosed. Prox RCA to Mid RCA lesion, 20% stenosed. Dist RCA lesion, 15% stenosed. Ost 1st Diag lesion, 40% stenosed. 1st Diag lesion, 90% stenosed. There is a 0% residual stenosis post intervention. A drug-eluting stent was placed. Dist LAD-2 lesion, 85% stenosed. Dist LAD-1 lesion, 70% stenosed. There is a 0% residual stenosis post intervention.A drug-eluting stent was placed.  CULTURES: Tracheal aspirate 12/30 >>   ANTIBIOTICS: levaquin 12/30 >>   LINES/TUBES: PIV, foley,  ETT 12/30 >>   DISCUSSION: 75 year old female with history of ischemic CVA, presents with symptoms of new CVA with left sided weakness, gaze preference and witnessed seizure.No new medication changes or trauma.  ASSESSMENT / PLAN:  PULMONARY A: Acute respiratory failure, intubated for airway protection due to seizure activity Possible pneumonia P:   Maintain intubated status at this time PRVC 8cc/kg Daily WUA Hold antibiotics at this time, in light of no leukocytosis or fever. levaquin was given x 1 in ED  CARDIOVASCULAR A: history of coronary ischemia s/p DES, see above P:  Hold further anticoagulation  RENAL A:  CKD P:   Monitor BMP Continue foley catheter  GASTROINTESTINAL A:  No GI complaints P:  Initiate feeds if she is is not extubated 1/1 PPI for SUP  HEMATOLOGIC A:  No coagulopathy  P:  Monitor CBC daily  INFECTIOUS A:  Abnormal CXR could indicate pulmonary edema, viral illness or aspiration P:   S/p levaquin Will hold additional antibiotics and follow Pct Repeat CXR  ENDOCRINE A:  History of DM  P:   Sliding Scale Insulin  NEUROLOGIC A:  Acute Encephalopathy with seizure activity No new CVA seen on MRI brain 12/31 P:   Neurology following, keppra ordered Will continue to monitor with neurochecks q4 hours RASS goal: -1  FAMILY  - Updates: present at bedside. FULL CODE at this time.  - Inter-disciplinary family meet or Palliative Care meeting due by:   06/26/16   The patient is critically ill with multiple organ system failure and requires high complexity decision making for assessment and support, frequent evaluation and titration of therapies, advanced monitoring, review of radiographic studies and interpretation of complex data.   Critical Care Time devoted to patient care services, exclusive of separately billable procedures, described in this note is 33  minutes.     Baltazar Apo, MD, PhD 06/20/2016, 3:07 PM [facility] [phone] or if no answer [phone]

## 2016-06-20 NOTE — Procedures (Signed)
EEG report 06/20/16  Referring physician- E Lindzen  Indication/history- 60 female with history of stroke came in with new inset seizures, currently intubated and sedated  Medication chlorhexidine gluconate (MEDLINE KIT) (PERIDEX) 0.12 % solution 15 mL   enoxaparin (LOVENOX) injection 40 mg  famotidine (PEPCID) IVPB 20 mg premix  fentaNYL (SUBLIMAZE) injection 50 mcg  fentaNYL (SUBLIMAZE) injection 50 mcg  insulin aspart (novoLOG) injection 0-15 Units  labetalol (NORMODYNE,TRANDATE) injection 10 mg  levETIRAcetam (KEPPRA) 500 mg in sodium chloride 0.9 % 100 mL IVPB  MEDLINE mouth rinse  propofol (DIPRIVAN) 1000 MG/100ML infusion   Technical- This is a multichannel digital EEG recording using the international 10-20 placement system.    Description of the recording No significant dominant rhythm seen. EEG comprises of generalized delta slowing at times more focal in right fronto temporal with periods of burst suppression  Epileptiform activity not seen during the recording   Impression EEG is abnormal and findings are suggestive  1- Generalized cerebral dysfunction 2- Right frontotemporal focal cerebral dysfunction suggesting structural abnormality 3- Epileptiform activity not seen during this recording.

## 2016-06-21 DIAGNOSIS — J9601 Acute respiratory failure with hypoxia: Secondary | ICD-10-CM

## 2016-06-21 DIAGNOSIS — I69398 Other sequelae of cerebral infarction: Principal | ICD-10-CM

## 2016-06-21 DIAGNOSIS — J9600 Acute respiratory failure, unspecified whether with hypoxia or hypercapnia: Secondary | ICD-10-CM

## 2016-06-21 DIAGNOSIS — R569 Unspecified convulsions: Secondary | ICD-10-CM

## 2016-06-21 LAB — GLUCOSE, CAPILLARY
GLUCOSE-CAPILLARY: 109 mg/dL — AB (ref 65–99)
GLUCOSE-CAPILLARY: 120 mg/dL — AB (ref 65–99)
GLUCOSE-CAPILLARY: 47 mg/dL — AB (ref 65–99)
Glucose-Capillary: 105 mg/dL — ABNORMAL HIGH (ref 65–99)
Glucose-Capillary: 111 mg/dL — ABNORMAL HIGH (ref 65–99)
Glucose-Capillary: 116 mg/dL — ABNORMAL HIGH (ref 65–99)
Glucose-Capillary: 94 mg/dL (ref 65–99)
Glucose-Capillary: 98 mg/dL (ref 65–99)

## 2016-06-21 MED ORDER — HYDRALAZINE HCL 20 MG/ML IJ SOLN
INTRAMUSCULAR | Status: AC
  Filled 2016-06-21: qty 1

## 2016-06-21 MED ORDER — HYDRALAZINE HCL 20 MG/ML IJ SOLN
20.0000 mg | INTRAMUSCULAR | Status: DC | PRN
  Administered 2016-06-21 – 2016-06-24 (×5): 20 mg via INTRAVENOUS
  Filled 2016-06-21 (×4): qty 1

## 2016-06-21 MED ORDER — DEXTROSE 50 % IV SOLN: 1.0000 | Freq: Once | INTRAVENOUS | Status: DC

## 2016-06-21 MED ORDER — METOPROLOL TARTRATE 5 MG/5ML IV SOLN
5.0000 mg | Freq: Four times a day (QID) | INTRAVENOUS | Status: DC
  Administered 2016-06-21 – 2016-06-22 (×3): 5 mg via INTRAVENOUS
  Filled 2016-06-21 (×3): qty 5

## 2016-06-21 MED ORDER — ORAL CARE MOUTH RINSE
15.0000 mL | Freq: Two times a day (BID) | OROMUCOSAL | Status: DC
  Administered 2016-06-21 – 2016-06-25 (×4): 15 mL via OROMUCOSAL

## 2016-06-21 NOTE — Progress Notes (Signed)
Subjective: Propofol just discontinued  Exam: Vitals:   06/21/16 0500 06/21/16 0600  BP: 124/75 126/72  Pulse: 71 70  Resp: 16 16  Temp:     Gen: In bed, NAD Resp: non-labored breathing, no acute distress Abd: soft, nt  Neuro: MS: opens eyes partially, does nto follow commands [id number], blinks eyes to eyelid stim bilaterally Motor: withdraws to noxious stimuli x 4 Sensory:intact to LT  Pertinent Labs: Elevated creatinine at 1.4  MRI reviewed, not evidence of acute process.   Impression: 76 yo F with new onset seizures in the setting of previous cortical infarct. Liekly secondary to cortical infarct. EEG without evidence of ongoing seizure and no further clinical seizures.   Recommendations: 1) Agree with lightening sedations, working towards extuabtion per CCM 2) continue keppra 500mg  BID 3) will follow.   Roland Rack, MD [facility] [phone]  If 7pm- 7am, please page neurology on call as listed in [place].

## 2016-06-21 NOTE — Progress Notes (Signed)
SLP Cancellation Note  Patient Details Name: Kayla [name] MRN: [id number] DOB: Mar 03, 1941   Cancelled treatment:       Reason Eval/Treat Not Completed: Medical issues which prohibited therapy (Remains intubated. Will f/u 1/2. )  Gabriel Rainwater [place], CCC-SLP [phone]  Gabriel Rainwater [place] 06/21/2016, 8:35 AM

## 2016-06-21 NOTE — Procedures (Signed)
Extubation Procedure Note  Patient Details:   Name: Kayla [name] DOB: 03-May-1941 MRN: [id number]   Airway Documentation:     Evaluation  O2 sats: stable throughout Complications: No apparent complications Patient did tolerate procedure well. Bilateral Breath Sounds: Clear   Yes Pt extubated to a 4lpm [place]   Cordella Register 06/21/2016, 9:10 AM

## 2016-06-21 NOTE — Progress Notes (Signed)
PULMONARY / CRITICAL CARE MEDICINE   Name: Kayla [name] MRN: [id number] DOB: 28-Aug-1940    ADMISSION DATE:  06/19/2016 CONSULTATION DATE:  @TODAY @   REFERRING MD:   CHIEF COMPLAINT:  Acute encephalopathy, witnessed seizure   Subjective / Interval events:  MRI negative for acute findings. No further seizures Passed SBT  VITAL SIGNS: BP 126/72   Pulse 70   Temp 98.1 F (36.7 C) (Axillary)   Resp 16   Ht 5\' 7"  (1.702 m)   Wt 188 lb 11.4 oz (85.6 kg)   SpO2 100%   BMI 29.56 kg/m   HEMODYNAMICS:  stable  VENTILATOR SETTINGS: Vent Mode: PRVC FiO2 (%):  [40 %] 40 % Set Rate:  [16 bmp] 16 bmp Vt Set:  [500 mL] 500 mL PEEP:  [5 cmH20] 5 cmH20 Pressure Support:  [8 cmH20] 8 cmH20 Plateau Pressure:  [15 cmH20-16 cmH20] 16 cmH20 Vent Mode: PRVC FiO2 (%):  [40 %] 40 % Set Rate:  [16 bmp] 16 bmp Vt Set:  [500 mL] 500 mL PEEP:  [5 cmH20] 5 cmH20 Pressure Support:  [8 cmH20] 8 cmH20 Plateau Pressure:  [15 cmH20-16 cmH20] 16 cmH20  INTAKE / OUTPUT: I/O last 3 completed shifts: In: 731.1 [I.V.:426.1; Other:150; IV Piggyback:155] Out: 1015 [id number]  PHYSICAL EXAMINATION: Physical Exam: Temp:  [97.4 F (36.3 C)-99.2 F (37.3 C)] 98.1 F (36.7 C) (01/01 0400) Pulse Rate:  [60-102] 70 (01/01 0600) Resp:  [14-20] 16 (01/01 0600) BP: (111-229)/(65-184) 126/72 (01/01 0600) SpO2:  [94 %-100 %] 100 % (01/01 0902) FiO2 (%):  [40 %] 40 % (01/01 0600) Weight:  [188 lb 11.4 oz (85.6 kg)] 188 lb 11.4 oz (85.6 kg) (01/01 0443)  General Intubated, sedated, in no apparent distress  HEENT No gross abnormalities. ETT present   Pulmonary Clear to auscultation bilaterally with no wheezes, rales or ronchi. Good effort, symmetrical expansion. Excellent F/Vt  Cardiovascular Normal rate, regular rhythm. S1, s2. Distal pulses palpable.  Abdomen Soft, non-tender, non-distended, positive bowel sounds  Neurologic Moves all extremities. Has chronic left side weakness. Follows commands.  No further seizure   Skin/Integuement No rash, no cyanosis, no clubbing.    LABS:  BMET  Recent Labs Lab 06/19/16 2235 06/19/16 2255 06/20/16 0157  NA 141 142 140  K 4.5 4.4 4.5  CL 110 109 111  CO2 16*  --  18*  BUN 17 20 17   CREATININE 1.40* 1.40* 1.40*  GLUCOSE 144* 153* 169*    Electrolytes  Recent Labs Lab 06/19/16 2235 06/20/16 0157  CALCIUM 8.7* 8.7*  MG  --  1.9  PHOS  --  3.7    CBC  Recent Labs Lab 06/19/16 2235 06/19/16 2255 06/20/16 0157  WBC 8.5  --  10.2  HGB 13.4 13.9 13.4  HCT 40.4 41.0 42.4  PLT 252  --  243    Coag's  Recent Labs Lab 06/19/16 2235  APTT 27  INR 1.01    Sepsis Markers  Recent Labs Lab 06/20/16 0158  PROCALCITON <0.10    ABG  Recent Labs Lab 06/20/16 0026  PHART 7.256*  PCO2ART 40.1  PO2ART 108.0    Liver Enzymes  Recent Labs Lab 06/19/16 2235  AST 18  ALT 13*  ALKPHOS 72  BILITOT 0.3  ALBUMIN 3.6    Cardiac Enzymes No results for input(s): TROPONINI, PROBNP in the last 168 hours.  Glucose  Recent Labs Lab 06/20/16 1122 06/20/16 1542 06/20/16 2022 06/21/16 0057 06/21/16 0356 06/21/16 0756  GLUCAP 116* 126*  126* 109* 120* 111*    Imaging No results found.  Cardiac Cath 12/2014 Ost RCA lesion, 40% stenosed. Prox RCA to Mid RCA lesion, 20% stenosed. Dist RCA lesion, 15% stenosed. Ost 1st Diag lesion, 40% stenosed. 1st Diag lesion, 90% stenosed. There is a 0% residual stenosis post intervention. A drug-eluting stent was placed. Dist LAD-2 lesion, 85% stenosed. Dist LAD-1 lesion, 70% stenosed. There is a 0% residual stenosis post intervention.A drug-eluting stent was placed.  CULTURES: Tracheal aspirate 12/30 >>   ANTIBIOTICS: levaquin 12/30 >>   LINES/TUBES: PIV, foley,  ETT 12/30 >> 1/1  DISCUSSION: 76 year old female with history of ischemic CVA, presents with symptoms of new CVA with left sided weakness, gaze preference and witnessed seizure.No new medication  changes or trauma. No further seizures. Passed SBT. MRI neg acute findings.  For today Extubate Wean O2 Initiate PT/OT Bedside swallow Cont keppra   ASSESSMENT / PLAN:  PULMONARY A: Acute respiratory failure, intubated for airway protection due to seizure activity Possible pneumonia ->passed SBT P:   Extubate Wean O2 Pulm hygiene measures Bedside swallow eval prior to diet   CARDIOVASCULAR A: history of coronary ischemia s/p DES, see above P:  Hold further anticoagulation  RENAL A:   CKD Resolving Metabolic acidosis (lactate d/t seizures) P:   Monitor BMP Continue foley catheter  GASTROINTESTINAL A:  No GI complaints P:  NPO Adv diet as tol   HEMATOLOGIC A:  No coagulopathy  P:  Monitor CBC daily  INFECTIOUS A:   Abnormal CXR could indicate pulmonary edema, viral illness or aspiration -remains afebrile No leukocytosis  P:   F/u am cxr  ENDOCRINE A:  History of DM  P:   Sliding Scale Insulin  NEUROLOGIC A:  Acute Encephalopathy with seizure activity No new CVA seen on MRI brain 12/31 P:   Neurology following, keppra ordered Will continue to monitor with neurochecks q4 hours RASS goal: -1  FAMILY  - Updates: present at bedside. FULL CODE at this time.  - Inter-disciplinary family meet or Palliative Care meeting due by:  06/26/16  My ccm time 46 minutes  [name] ACNP-BC [place] Pager # [phone] OR # [phone] if no answer  Attending Note:  I have examined patient, reviewed labs, studies and notes. I have discussed the case with Jerrye Bushy, and I agree with the data and plans as amended above. 76 yo woman with hx CVA, developed new seizures requiring intubation for airway protection. MRI 12/31 without new CVA. On eval she tolerated SBT, has clear lung exam, is interacting appropriately. She will be extubated. Will assess formal swallowing eval.  Independent critical care time is 35 minutes.   Baltazar Apo, MD,  PhD 06/21/2016, 10:18 PM [facility] [phone] or if no answer [phone]

## 2016-06-22 LAB — GLUCOSE, CAPILLARY
GLUCOSE-CAPILLARY: 94 mg/dL (ref 65–99)
GLUCOSE-CAPILLARY: 105 mg/dL — AB (ref 65–99)
Glucose-Capillary: 107 mg/dL — ABNORMAL HIGH (ref 65–99)
Glucose-Capillary: 108 mg/dL — ABNORMAL HIGH (ref 65–99)
Glucose-Capillary: 108 mg/dL — ABNORMAL HIGH (ref 65–99)

## 2016-06-22 MED ORDER — ASPIRIN 81 MG PO CHEW
324.0000 mg | CHEWABLE_TABLET | Freq: Every day | ORAL | Status: DC
  Administered 2016-06-22 – 2016-06-25 (×4): 324 mg via ORAL
  Filled 2016-06-22 (×5): qty 4

## 2016-06-22 MED ORDER — ATORVASTATIN CALCIUM 10 MG PO TABS
20.0000 mg | ORAL_TABLET | Freq: Every day | ORAL | Status: DC
  Administered 2016-06-22 – 2016-06-25 (×4): 20 mg via ORAL
  Filled 2016-06-22 (×4): qty 2

## 2016-06-22 MED ORDER — HYDRALAZINE HCL 50 MG PO TABS
50.0000 mg | ORAL_TABLET | Freq: Four times a day (QID) | ORAL | Status: DC
  Administered 2016-06-22 – 2016-06-25 (×13): 50 mg via ORAL
  Filled 2016-06-22 (×13): qty 1

## 2016-06-22 MED ORDER — ISOSORBIDE MONONITRATE ER 60 MG PO TB24
60.0000 mg | ORAL_TABLET | Freq: Every day | ORAL | Status: DC
  Administered 2016-06-22 – 2016-06-25 (×4): 60 mg via ORAL
  Filled 2016-06-22: qty 1
  Filled 2016-06-22: qty 2
  Filled 2016-06-22 (×2): qty 1

## 2016-06-22 MED ORDER — METOPROLOL TARTRATE 50 MG PO TABS
50.0000 mg | ORAL_TABLET | Freq: Two times a day (BID) | ORAL | Status: DC
  Administered 2016-06-22 – 2016-06-25 (×7): 50 mg via ORAL
  Filled 2016-06-22 (×7): qty 1

## 2016-06-22 MED ORDER — INSULIN ASPART 100 UNIT/ML ~~LOC~~ SOLN
0.0000 [IU] | Freq: Three times a day (TID) | SUBCUTANEOUS | Status: DC
  Administered 2016-06-23 – 2016-06-24 (×2): 2 [IU] via SUBCUTANEOUS

## 2016-06-22 MED ORDER — CLOPIDOGREL BISULFATE 75 MG PO TABS
75.0000 mg | ORAL_TABLET | Freq: Every day | ORAL | Status: DC
  Administered 2016-06-22 – 2016-06-25 (×4): 75 mg via ORAL
  Filled 2016-06-22 (×4): qty 1

## 2016-06-22 NOTE — Progress Notes (Signed)
PULMONARY / CRITICAL CARE MEDICINE   Name: Kayla [name] MRN: [id number] DOB: 10-Jan-1941    ADMISSION DATE:  06/19/2016 CONSULTATION DATE:  @TODAY @   REFERRING MD:   CHIEF COMPLAINT:  Acute encephalopathy, witnessed seizure   Subjective / Interval events:  More awake, HTN over night required Rx VITAL SIGNS: BP (!) 151/106   Pulse (!) 107   Temp 98.3 F (36.8 C) (Axillary)   Resp (!) 24   Ht 5\' 7"  (1.702 m)   Wt 178 lb 2.1 oz (80.8 kg)   SpO2 98%   BMI 27.90 kg/m   HEMODYNAMICS:  stable  VENTILATOR SETTINGS:      INTAKE / OUTPUT: I/O last 3 completed shifts: In: 904.5 [I.V.:489.5; IV Piggyback:415] Out: 2485 [Urine:2485]  PHYSICAL EXAMINATION: Physical Exam: Temp:  [97.1 F (36.2 C)-99.1 F (37.3 C)] 98.3 F (36.8 C) (01/02 0800) Pulse Rate:  [95-121] 107 (01/02 0900) Resp:  [17-30] 24 (01/02 0900) BP: (118-192)/(66-109) 151/106 (01/02 0900) SpO2:  [86 %-99 %] 98 % (01/02 0900) Weight:  [178 lb 2.1 oz (80.8 kg)] 178 lb 2.1 oz (80.8 kg) (01/02 0400)  General Awake alert no distress.   HEENT No gross abnormalities. MMM, no JVD    Pulmonary Clear to auscultation bilaterally with no wheezes, rales or ronchi. Good effort, symmetrical expansion.   Cardiovascular Normal rate, regular rhythm. S1, s2. Distal pulses palpable.  Abdomen Soft, non-tender, non-distended, positive bowel sounds  Neurologic Moves all extremities. Has chronic left side weakness. Follows commands. No further seizure. Oriented X 2, sp a little slurred   Skin/Integuement No rash, no cyanosis, no clubbing.    LABS:  BMET  Recent Labs Lab 06/19/16 2235 06/19/16 2255 06/20/16 0157  NA 141 142 140  K 4.5 4.4 4.5  CL 110 109 111  CO2 16*  --  18*  BUN 17 20 17   CREATININE 1.40* 1.40* 1.40*  GLUCOSE 144* 153* 169*    Electrolytes  Recent Labs Lab 06/19/16 2235 06/20/16 0157  CALCIUM 8.7* 8.7*  MG  --  1.9  PHOS  --  3.7    CBC  Recent Labs Lab 06/19/16 2235  06/19/16 2255 06/20/16 0157  WBC 8.5  --  10.2  HGB 13.4 13.9 13.4  HCT 40.4 41.0 42.4  PLT 252  --  243    Coag's  Recent Labs Lab 06/19/16 2235  APTT 27  INR 1.01    Sepsis Markers  Recent Labs Lab 06/20/16 0158  PROCALCITON <0.10    ABG  Recent Labs Lab 06/20/16 0026  PHART 7.256*  PCO2ART 40.1  PO2ART 108.0    Liver Enzymes  Recent Labs Lab 06/19/16 2235  AST 18  ALT 13*  ALKPHOS 72  BILITOT 0.3  ALBUMIN 3.6    Cardiac Enzymes No results for input(s): TROPONINI, PROBNP in the last 168 hours.  Glucose  Recent Labs Lab 06/21/16 1608 06/21/16 1617 06/21/16 2037 06/21/16 2353 06/22/16 0353 06/22/16 0830  GLUCAP 47* 98 94 116* 94 107*    Imaging No results found.  Cardiac Cath 12/2014 Ost RCA lesion, 40% stenosed. Prox RCA to Mid RCA lesion, 20% stenosed. Dist RCA lesion, 15% stenosed. Ost 1st Diag lesion, 40% stenosed. 1st Diag lesion, 90% stenosed. There is a 0% residual stenosis post intervention. A drug-eluting stent was placed. Dist LAD-2 lesion, 85% stenosed. Dist LAD-1 lesion, 70% stenosed. There is a 0% residual stenosis post intervention.A drug-eluting stent was placed.  CULTURES: Tracheal aspirate 12/30 >> not collected  ANTIBIOTICS: levaquin  12/30 >> off  LINES/TUBES: PIV, foley,  ETT 12/30 >> 1/1  DISCUSSION: 76 year old female with history of ischemic CVA, presents with symptoms of new CVA with left sided weakness, gaze preference and witnessed seizure.No new medication changes or trauma. No further seizures. Passed SBT. MRI neg acute findings. Extubated 1/1. Still a little confused but getting better.  For today Add diet mobilize Wean O2 Initiate PT/OT Cont keppra  Move to med/surg  ASSESSMENT / PLAN:  PULMONARY A: Acute respiratory failure, intubated for airway protection due to seizure activity Possible pneumonia ->passed SBT & extubated 1/1 P:   OOB Mobilize Wean O2  CARDIOVASCULAR A: history  of coronary ischemia s/p DES, see above\ HTN P:  Adding back home rx Added back plavix Dc tele   RENAL A:   CKD Resolving Metabolic acidosis (lactate d/t seizures) P:   Ck am creatinine  Dc foley   GASTROINTESTINAL A:  No GI complaints P:  Adv diet as tol   HEMATOLOGIC A:  No coagulopathy  P:  Monitor CBC daily  INFECTIOUS A:   Abnormal CXR could indicate pulmonary edema, viral illness or aspiration -remains afebrile No leukocytosis  P:   Trend fever curve   ENDOCRINE A:  History of DM  P:   Sliding Scale Insulin Holding oral rx   NEUROLOGIC A:  Acute Encephalopathy with seizure activity No new CVA seen on MRI brain 12/31 P:   Neurology following, keppra ordered Add PT/OT Get OOB  FAMILY  - Updates: present at bedside. FULL CODE at this time.  - Inter-disciplinary family meet or Palliative Care meeting due by:  06/26/16  My ccm time 69 minutes  [name] ACNP-BC [place] Pager # [phone] OR # [phone] if no answer  ATTENDING NOTE / ATTESTATION NOTE :   I have discussed the case with the resident/APP  [name] NP  I agree with the resident/APP's  history, physical examination, assessment, and plans.    I have edited the above note and modified it according to our agreed history, physical examination, assessment and plan.   Briefly, 76 year old female with history of ischemic CVA, presents with symptoms of new CVA with left sided weakness, gaze preference and witnessed seizure. MRI neg acute findings. Extubated 1/1. Still a little confused but getting better. Over all doing better.  Plan to transfer to med surg today.   Pt seen and examined. VSS. (-) NVD.  Good ae, CTA. Good s1/s2. (-) m/r/g.  (+) BS, soft, NT. (-) edema. Labs reviewed.   Assessment/Plan : S/P Intubation for resp failure 2/2 unable to protect airway.  Clinically improved. IS. Asp precaution.   S/P sze with acute encephalopathy. Clinically improved.  Cont AED. Neurology following. Cont Lipitor, Plavix.    Family .  No family at bedside.   Transfer to med surg today. TRH will be primary starting 1/3, PCCM will be off 1/3.    [name], MD 06/22/2016, 2:22 PM [facility] Pager [phone] After 3 pm or if no answer, call [phone]

## 2016-06-22 NOTE — Evaluation (Signed)
Clinical/Bedside Swallow Evaluation Patient Details  Name: Kayla [name] MRN: [id number] Date of Birth: 21-Sep-1940  Today's Date: 06/22/2016 Time: SLP Start Time (ACUTE ONLY): 0900 SLP Stop Time (ACUTE ONLY): 0920 SLP Time Calculation (min) (ACUTE ONLY): 20 min  Past Medical History:  Past Medical History:  Diagnosis Date  . Allergy    Shell fish  . Anxiety   . Arthritis    "knees, back, probably left hand" (01/14/2015)  . Asthmatic bronchitis   . Blind left eye   . Constipation   . Coronary artery disease   . Depression   . Diverticulosis of colon   . DJD (degenerative joint disease)   . Fibromyalgia   . GERD (gastroesophageal reflux disease)   . Heart murmur   . History of gout   . Hypercholesteremia   . Hypertension   . Personal history of noncompliance with medical treatment, presenting hazards to health   . Prolapse of vaginal vault after hysterectomy   . Proteinuria   . Sickle-cell trait ([facility])   . Somatic dysfunction   . Stroke [facility]) ~ 08/2014   "blind in left eye; weak on left side since" (01/14/2015)  . Type II diabetes mellitus ([facility])   . Venous insufficiency    Past Surgical History:  Past Surgical History:  Procedure Laterality Date  . ABDOMINAL HYSTERECTOMY    . CARDIAC CATHETERIZATION N/A 01/14/2015   Procedure: Left Heart Cath and Coronary Angiography;  Surgeon: Charolette Forward, MD;  Location: [place] CV LAB;  Service: Cardiovascular;  Laterality: N/A;  . CARDIAC CATHETERIZATION  ~ 2011  . CATARACT EXTRACTION W/ INTRAOCULAR LENS  IMPLANT, BILATERAL Bilateral 2012  . CORONARY ANGIOPLASTY    . DILATION AND CURETTAGE OF UTERUS  "probably"  . LOOP RECORDER IMPLANT N/A 09/03/2014   Procedure: LOOP RECORDER IMPLANT;  Surgeon: Thompson Grayer, MD;  Location: [facility] CATH LAB;  Service: Cardiovascular;  Laterality: N/A;  . ROBOTIC ASSISTED LAPAROSCOPIC SACROCOLPOPEXY  09/2009   Dr. Matilde Sprang  . TEE WITHOUT CARDIOVERSION N/A 09/02/2014   Procedure: TRANSESOPHAGEAL  ECHOCARDIOGRAM (TEE);  Surgeon: Lelon Perla, MD;  Location: [facility] ENDOSCOPY;  Service: Cardiovascular;  Laterality: N/A;   HPI:  76 year old female with history of ischemic CVA, presents with symptoms of new CVA with left sided weakness, gaze preference and witnessed seizure.No new medication changes or trauma. No further seizures. Passed SBT. MRI neg acute findings. Extubated 1/1. Still a little confused but getting better.   Assessment / Plan / Recommendation Clinical Impression  Patient presents with generalized oral weankess and slowed reponses during the oral motor exam.  Regular textures resulted in a prolonged oral phase with multple attemtps needed for oral clearance.  Thin liquids consumed at a rapid rate resulted in no overt s/s of aspiration.  Given short length of intubation and no know history of dysphagia following previous CVA, lethergy remains biggest risk factor.  Recommend initiation of Dys.3 textures and thin liquids with full supervision to ensure arousal, small portions, and a slow pace with PO intake.  Plan for brief SLP follow-up.       Aspiration Risk  Mild aspiration risk    Diet Recommendation Dysphagia 3 (Mech soft);Thin liquid   Liquid Administration via: Cup;Straw Medication Administration: Whole meds with liquid (one at a time) Supervision: Full supervision/cueing for compensatory strategies;Patient able to self feed Compensations: Minimize environmental distractions;Slow rate;Small sips/bites;Lingual sweep for clearance of pocketing Postural Changes: Seated upright at 90 degrees    Other  Recommendations Oral Care Recommendations:  Oral care BID   Follow up Recommendations None      Frequency and Duration    2 weeks       Prognosis Prognosis for Safe Diet Advancement: Good Barriers to Reach Goals: Cognitive deficits      Swallow Study   General HPI: 76 year old female with history of ischemic CVA, presents with symptoms of new CVA with left sided  weakness, gaze preference and witnessed seizure.No new medication changes or trauma. No further seizures. Passed SBT. MRI neg acute findings. Extubated 1/1. Still a little confused but getting better. Previous Swallow Assessment: none on record  Diet Prior to this Study: NPO Temperature Spikes Noted: No Respiratory Status: Nasal cannula History of Recent Intubation: Yes Length of Intubations (days): 3 days Date extubated: 06/21/16 Behavior/Cognition: Lethargic/Drowsy;Cooperative;Requires cueing Oral Cavity Assessment: Within Functional Limits Oral Care Completed by SLP: Recent completion by staff Oral Cavity - Dentition: Dentures, not available (top edentulous; bottom intact) Vision: Functional for self-feeding Self-Feeding Abilities: Needs assist Patient Positioning: Upright in bed Baseline Vocal Quality: Normal Volitional Cough: Strong Volitional Swallow: Able to elicit    Oral/Motor/Sensory Function Overall Oral Motor/Sensory Function: Mild impairment (generalized weakness)   Ice Chips Ice chips: Within functional limits Presentation: Spoon   Thin Liquid Thin Liquid: Within functional limits Presentation: Cup;Straw;Self Fed Pharyngeal  Phase Impairments: Multiple swallows Other Comments: intermittent double swallow, suspect with parge sips; 3 oz chug challenge resulted in no overt s/s of aspiration    Nectar Thick Nectar Thick Liquid: Not tested   Honey Thick Honey Thick Liquid: Not tested   Puree Puree: Within functional limits Presentation: Spoon   Solid   GO   Solid: Impaired Oral Phase Impairments: Poor awareness of bolus;Impaired mastication Oral Phase Functional Implications: Prolonged oral transit;Oral residue       Kayla [name], M.A., CCC-SLP [phone]  Kayla [name] 06/22/2016,9:34 AM

## 2016-06-22 NOTE — Evaluation (Signed)
Physical Therapy Evaluation Patient Details Name: Kayla [name] MRN: [id number] DOB: 1941/01/28 Today's Date: 06/22/2016   History of Present Illness  Patient is a 76 y/o female with hx of DM, CVA, sickle trait, HTN, fibromyalgia, CAD, blind left eye, anxiety presents with Acute encephalopathy and witnessed seizure.   Clinical Impression  Patient presents with left sided weakness (could be residual from prior CVA), impaired sitting balance, impaired initiation and impaired mobility s/p above. Pt going to OPPT PTA. Today, pt tolerated standing with mod A for balance/safety. Having difficulty with dynamic sitting balance requiring support due to LOB. Will attempt gait training next session as tolerated. Pt not safe to return home at this time. Would benefit from CIR to maximize independence and mobility prior to return home. Will follow acutely.     Follow Up Recommendations CIR    Equipment Recommendations  None recommended by PT    Recommendations for Other Services OT consult     Precautions / Restrictions Precautions Precautions: Fall Restrictions Weight Bearing Restrictions: No      Mobility  Bed Mobility Overal bed mobility: Needs Assistance Bed Mobility: Supine to Sit;Sit to Supine     Supine to sit: Mod assist;HOB elevated Sit to supine: Mod assist   General bed mobility comments: Needs cues to bring LEs off bed and to initiate movement, assist with trunk to get to EOB. Use of rail. Assist to bring LEs into bed. Able to help scoot self up in bed using rails.  Transfers Overall transfer level: Needs assistance Equipment used: Rolling walker (2 wheeled) Transfers: Sit to/from Stand Sit to Stand: Mod assist;From elevated surface         General transfer comment: Assist to power to standing with cues for hand placement/technique. posterior lean. Cues for knee extension and upright posture.   Ambulation/Gait Ambulation/Gait assistance:  (Deferred due to impaired  standing balance.)              Stairs            Wheelchair Mobility    Modified Rankin (Stroke Patients Only) Modified Rankin (Stroke Patients Only) Pre-Morbid Rankin Score: Moderate disability Modified Rankin: Severe disability     Balance Overall balance assessment: Needs assistance Sitting-balance support: Feet supported;No upper extremity supported Sitting balance-Leahy Scale: Poor Sitting balance - Comments: Able to sit statically wihtout UE support but LOB posteriorly with dynamic sitting tasks. Able to initiate thoracic and lumbar musculature into extension for upright posture but fatigues.  Postural control: Posterior lean Standing balance support: During functional activity;Bilateral upper extremity supported Standing balance-Leahy Scale: Poor Standing balance comment: Reliant on BUEs for support and Mod A for standing balance. Able to clear RLE in standing but not LLE despite cues for weightshifting.                             Pertinent Vitals/Pain Pain Assessment: No/denies pain    Home Living Family/patient expects to be discharged to:: Unsure Living Arrangements: Spouse/significant other Available Help at Discharge: Family Type of Home: House Home Access: Stairs to enter Entrance Stairs-Rails: Right;Left;Can reach both Entrance Stairs-Number of Steps: 2 in front, 4 in back able to reach both rails Home Layout: One level Home Equipment: Walker - 2 wheels;Toilet riser;Shower seat      Prior Function Level of Independence: Independent with assistive device(s)   Gait / Transfers Assistance Needed: Uses RW for ambulation.  ADL's / Homemaking Assistance Needed: Reports being able  to do ADLs.        Hand Dominance   Dominant Hand: Right    Extremity/Trunk Assessment   Upper Extremity Assessment Upper Extremity Assessment: Defer to OT evaluation (Decreased strength LUE but functional.)    Lower Extremity Assessment Lower  Extremity Assessment: Generalized weakness;LLE deficits/detail LLE Deficits / Details: grossly ~2+/5 throughout- has residual weakness from prior stroke so hard to tell if this is baseline or not.       Communication   Communication: HOH  Cognition Arousal/Alertness: Awake/alert Behavior During Therapy: Flat affect Overall Cognitive Status: Impaired/Different from baseline Area of Impairment: Orientation;Problem solving;Following commands Orientation Level: Disoriented to;Time     Following Commands: Follows multi-step commands with increased time (repetition at times but could be due to [facility])     Problem Solving: Slow processing;Requires verbal cues General Comments: Pt knew she had a seizure. States "December"  Some speech difficulties noted.    General Comments General comments (skin integrity, edema, etc.): Pt managing extra secretions in mouth.    Exercises     Assessment/Plan    PT Assessment Patient needs continued PT services  PT Problem List Decreased strength;Decreased mobility;Decreased balance;Decreased cognition;Decreased activity tolerance;Decreased range of motion          PT Treatment Interventions DME instruction;Therapeutic activities;Gait training;Therapeutic exercise;Patient/family education;Balance training;Functional mobility training    PT Goals (Current goals can be found in the Care Plan section)  Acute Rehab PT Goals Patient Stated Goal: to walk PT Goal Formulation: With patient Time For Goal Achievement: 07/06/16 Potential to Achieve Goals: Fair    Frequency Min 4X/week   Barriers to discharge        Co-evaluation               End of Session Equipment Utilized During Treatment: Gait belt Activity Tolerance: Patient tolerated treatment well Patient left: in bed;with call bell/phone within reach;with bed alarm set Nurse Communication: Mobility status         Time: [id number] PT Time Calculation (min) (ACUTE ONLY): 19  min   Charges:   PT Evaluation $PT Eval Moderate Complexity: 1 Procedure     PT G Codes:        Bertram Haddix A Yazmeen Woolf 06/22/2016, 3:37 PM Wray Kearns, [place], DPT [phone]

## 2016-06-22 NOTE — Progress Notes (Signed)
Inpatient Rehabilitation  PT has evaluated pt. and is recommending IP rehab.  Patient was screened by Gerlean Ren for appropriateness for an Inpatient Acute Rehab consult.  At this time, we are not recommending Inpatient Rehab consult as pt's brain MRI was negative for acute processes and pt. believed to have new onset seizures.   [place] Admissions Coordinator Cell [phone] Office [phone]

## 2016-06-22 NOTE — Progress Notes (Signed)
Subjective: Much improved since yesterday.   Exam: Vitals:   06/22/16 1130 06/22/16 1205  BP: 137/85 139/83  Pulse: 90 88  Resp: 16 20  Temp:  99.3 F (37.4 C)   Gen: In bed, NAD Resp: non-labored breathing, no acute distress Abd: soft, nt  Neuro: MS: Awake, gives month as December, year as 2014 [id number], left hemianopia(baseline per daughter) Motor: gives relatively symmetric effort in upper and lower extremities, but diffusely gives relatively poor effort.  Sensory:intact to LT  MRI reviewed, not evidence of acute process.   Impression: 76 yo F with new onset seizures in the setting of previous cortical infarct. Likely secondary to cortical infarct. EEG without evidence of ongoing seizure and no further clinical seizures.   Recommendations: 1) continue keppra 500mg  BID 2) will follow.   Roland Rack, MD [facility] [phone]  If 7pm- 7am, please page neurology on call as listed in [place].

## 2016-06-23 LAB — BASIC METABOLIC PANEL
ANION GAP: 7 (ref 5–15)
BUN: 21 mg/dL — ABNORMAL HIGH (ref 6–20)
CHLORIDE: 112 mmol/L — AB (ref 101–111)
CO2: 23 mmol/L (ref 22–32)
Calcium: 8.6 mg/dL — ABNORMAL LOW (ref 8.9–10.3)
Creatinine, Ser: 1.36 mg/dL — ABNORMAL HIGH (ref 0.44–1.00)
GFR calc non Af Amer: 37 mL/min — ABNORMAL LOW (ref >60)
GFR, EST AFRICAN AMERICAN: 43 mL/min — AB (ref >60)
Glucose, Bld: 122 mg/dL — ABNORMAL HIGH (ref 65–99)
POTASSIUM: 3.6 mmol/L (ref 3.5–5.1)
Sodium: 142 mmol/L (ref 135–145)

## 2016-06-23 LAB — GLUCOSE, CAPILLARY
GLUCOSE-CAPILLARY: 119 mg/dL — AB (ref 65–99)
GLUCOSE-CAPILLARY: 129 mg/dL — AB (ref 65–99)
Glucose-Capillary: 117 mg/dL — ABNORMAL HIGH (ref 65–99)
Glucose-Capillary: 110 mg/dL — ABNORMAL HIGH (ref 65–99)

## 2016-06-23 LAB — TYPE AND SCREEN
Blood Product Expiration Date: 201801072359
Blood Product Expiration Date: 201801122359
ISSUE DATE / TIME: 201801030018
UNIT TYPE AND RH: 9500
UNIT TYPE AND RH: 9500

## 2016-06-23 LAB — TRIGLYCERIDES: Triglycerides: 94 mg/dL (ref <150)

## 2016-06-23 MED ORDER — MENTHOL 3 MG MT LOZG
1.0000 | LOZENGE | OROMUCOSAL | Status: DC | PRN
  Filled 2016-06-23 (×2): qty 9

## 2016-06-23 MED ORDER — POLYETHYLENE GLYCOL 3350 17 G PO PACK
17.0000 g | PACK | Freq: Every day | ORAL | Status: DC
  Administered 2016-06-23 – 2016-06-25 (×3): 17 g via ORAL
  Filled 2016-06-23 (×3): qty 1

## 2016-06-23 MED ORDER — LEVETIRACETAM 500 MG PO TABS
500.0000 mg | ORAL_TABLET | Freq: Two times a day (BID) | ORAL | Status: DC
  Administered 2016-06-23 – 2016-06-25 (×5): 500 mg via ORAL
  Filled 2016-06-23 (×5): qty 1

## 2016-06-23 MED ORDER — CYANOCOBALAMIN 1000 MCG/ML IJ SOLN
1000.0000 ug | Freq: Once | INTRAMUSCULAR | Status: AC
  Administered 2016-06-23: 1000 ug via INTRAMUSCULAR
  Filled 2016-06-23: qty 1

## 2016-06-23 NOTE — Care Management Note (Signed)
Case Management Note  Patient Details  Name: Kayla [name] MRN: [id number] Date of Birth: 07/11/1940  Subjective/Objective:   Pt admitted with encephalopathy.  She is from home with her spouse.                 Action/Plan: PT recommending CIR but not a candidate for CIR. CSW aware. CM following for d/c disposition.   Expected Discharge Date:                  Expected Discharge Plan:  Skilled Nursing Facility  In-House Referral:  Clinical Social Work  Discharge planning Services     Post Acute Care Choice:    Choice offered to:     DME Arranged:    DME Agency:     HH Arranged:    [facility] Agency:     Status of Service:  In process, will continue to follow  If discussed at Long Length of Stay Meetings, dates discussed:    Additional Comments:  Pollie Friar, RN 06/23/2016, 11:03 AM

## 2016-06-23 NOTE — Progress Notes (Signed)
Physical Therapy Treatment Patient Details Name: Kayla [name] MRN: [id number] DOB: 1940-07-19 Today's Date: 06/23/2016    History of Present Illness Patient is a 76 y/o female with hx of DM, CVA, sickle trait, HTN, fibromyalgia, CAD, blind left eye, anxiety presents with Acute encephalopathy and witnessed seizure.     PT Comments    Patient with good progress this session with ambulation in hallway with assist.  Family interested and pt appropriate for SNF level rehab.  Will continue skilled PT in the acute setting.   Follow Up Recommendations  SNF     Equipment Recommendations  None recommended by PT    Recommendations for Other Services       Precautions / Restrictions Precautions Precautions: Fall    Mobility  Bed Mobility Overal bed mobility: Needs Assistance       Supine to sit: Mod assist     General bed mobility comments: increased time to initiate, assist to guide legs off bed and lift trunk with HOB elevated and scoot to EOB  Transfers Overall transfer level: Needs assistance Equipment used: Rolling walker (2 wheeled) Transfers: Sit to/from Stand Sit to Stand: Mod assist;From elevated surface;+2 safety/equipment         General transfer comment: lifting help, cues for hand placement  Ambulation/Gait Ambulation/Gait assistance: +2 safety/equipment;Mod assist Ambulation Distance (Feet): 50 Feet (and 70') Assistive device: Rolling walker (2 wheeled) Gait Pattern/deviations: Step-through pattern;Decreased stride length;Drifts right/left;Trunk flexed     General Gait Details: tendency to fatigue and leans forward; once got feet crossed and needed to sit, then walked again; cues for upright posture, assist to guide walker, family in hallway assisting with getting pt to look up   Stairs            Wheelchair Mobility    Modified Rankin (Stroke Patients Only)       Balance Overall balance assessment: Needs assistance Sitting-balance support:  No upper extremity supported;Feet supported Sitting balance-Leahy Scale: Fair     Standing balance support: Bilateral upper extremity supported Standing balance-Leahy Scale: Poor Standing balance comment: needs UE support and min A for standing balance                    Cognition Arousal/Alertness: Awake/alert Behavior During Therapy: WFL for tasks assessed/performed Overall Cognitive Status: Impaired/Different from baseline Area of Impairment: Memory Orientation Level: Disoriented to;Situation   Memory: Decreased short-term memory       Problem Solving: Slow processing;Requires verbal cues;Requires tactile cues      Exercises      General Comments General comments (skin integrity, edema, etc.): family in room and helping with motivation      Pertinent Vitals/Pain Pain Assessment: No/denies pain    Home Living                      Prior Function            PT Goals (current goals can now be found in the care plan section) Progress towards PT goals: Progressing toward goals    Frequency    Min 3X/week      PT Plan Discharge plan needs to be updated;Frequency needs to be updated    Co-evaluation             End of Session Equipment Utilized During Treatment: Gait belt Activity Tolerance: Patient tolerated treatment well Patient left: in chair;with call bell/phone within reach;with family/visitor present;with chair alarm set     Time: [id number]  PT Time Calculation (min) (ACUTE ONLY): 31 min  Charges:  $Gait Training: 23-37 mins                    G Codes:      Reginia Naas 02-Jul-2016, 3:46 PM  Magda Kiel, [place] 02-Jul-2016

## 2016-06-23 NOTE — Progress Notes (Signed)
[facility] PROGRESS NOTE  Kayla [name] [id number] DOB: [birth year] DOA: 06/19/2016   PCP: Hoyt Koch, MD     Assessment/Plan: Active Problems:   Encephalopathy   Seizure as late effect of cerebrovascular accident (CVA) [facility])   Acute respiratory failure ([place])   76 y.o. female with ahistory right cerebral infarctions, diabetes mellitus, hypertension, hyperlipidemia and coronary artery disease, brought to the emergency room with altered mental status. She was last seen well at 9:30 a.m. When family returned at 7:30 PM she was noted to have slurred speech and slumped to the left side, as well as staring to the left side. After arriving in the emergency room mental status improved. She was alert and answering questions appropriately for a while, then suddenly stopped talking and became tense and demonstrated a generalized seizure. She was given Ativan 2 mg and subsequently intubated for airway protection. She was also started on propofol drip IV. No further seizure activity was reported. CT scan of her head showed old right occipital, posterior temporal and frontal infarctions  Assessment and plan New-onset seizure in the setting of cortical infarct EEG without evidence of ongoing seizure and no further clinical seizures.  continue keppra 500mg  BID Dysphagia 3 (Mech soft);Thin liquid  No new CVA seen on MRI brain 12/31 Denied CIR, SNF versus home health   Acute respiratory failure, intubated for airway protection due to seizure activity Possible pneumonia, levofloxacin stopped 12/31 passed SBT & extubated 1/1    history of coronary ischemia s/p DES, see above Continue aspirin and Plavix  Hypertension Stable  Chronic kidney disease, stage III-baseline creatinine 1.3 Stable  Diabetes mellitus-continue sliding scale insulin. Most recent hemoglobin A1c 6.7   Acute encephalopathy secondary to seizure-resolved   DVT prophylaxsis Lovenox  Code Status:   Full code    Family Communication: Discussed in detail with the patient, all imaging results, lab results explained to the patient   Disposition Plan:  1-2 days, likely SNF      Consultants:  Critical care  Neurology  Procedures:  None  Antibiotics: Anti-infectives    Start     Dose/Rate Route Frequency Ordered Stop   06/19/16 2330  levofloxacin (LEVAQUIN) IVPB 750 mg  Status:  Discontinued     750 mg 100 mL/hr over 90 Minutes Intravenous  Once 06/19/16 2322 06/20/16 0123         HPI/Subjective: Patient awake alert and oriented, did well with speech therapy, patient's son and daughter by the bedside Did feel that patient is back to baseline, would like to take her home with home health  Objective: Vitals:   06/23/16 0018 06/23/16 0106 06/23/16 0452 06/23/16 0600  BP: (!) 151/50 (!) 142/72 (!) 142/93   Pulse:  75 75   Resp:  18 18   Temp:  98.4 F (36.9 C) 98.2 F (36.8 C)   TempSrc:  Axillary Oral   SpO2:  98% 97%   Weight:    79.1 kg (174 lb 4.8 oz)  Height:        Intake/Output Summary (Last 24 hours) at 06/23/16 0929 Last data filed at 06/22/16 1756  Gross per 24 hour  Intake              470 ml  Output              150 ml  Net              320 ml    Exam:  Examination:  General exam: Appears calm and comfortable  Respiratory system: Clear to auscultation. Respiratory effort normal. Cardiovascular system: S1 & S2 heard, RRR. No JVD, murmurs, rubs, gallops or clicks. No pedal edema. Gastrointestinal system: Abdomen is nondistended, soft and nontender. No organomegaly or masses felt. Normal bowel sounds heard. Central nervous system: Alert and oriented. No focal neurological deficits. Extremities: Symmetric 5 x 5 power. Skin: No rashes, lesions or ulcers Psychiatry: Judgement and insight appear normal. Mood & affect appropriate.     Data Reviewed: I have personally reviewed following labs and imaging studies  Micro Results Recent Results  (from the past 240 hour(s))  MRSA PCR Screening     Status: None   Collection Time: 06/20/16  2:50 AM  Result Value Ref Range Status   MRSA by PCR NEGATIVE NEGATIVE Final    Comment:        The GeneXpert MRSA Assay (FDA approved for NASAL specimens only), is one component of a comprehensive MRSA colonization surveillance program. It is not intended to diagnose MRSA infection nor to guide or monitor treatment for MRSA infections.     Radiology Reports Ct Head Wo Contrast  Result Date: 06/19/2016 CLINICAL DATA:  Patient with seizure.  Altered mental status. EXAM: CT HEAD WITHOUT CONTRAST TECHNIQUE: Contiguous axial images were obtained from the base of the skull through the vertex without intravenous contrast. COMPARISON:  CT brain 09/01/2014. FINDINGS: Brain: Ventricles and sulci are prominent. Encephalomalacia demonstrated within the right occipital lobe. Old right frontal periventricular infarct. Extensive low-attenuation throughout the right cerebral hemisphere white matter. No evidence for intracranial hemorrhage, mass lesion or mass-effect. Vascular: Internal carotid arterial vascular calcifications. Skull: Intact. Sinuses/Orbits: Mastoid air cells unremarkable. Paranasal sinuses are well aerated. Other: None. IMPRESSION: Encephalomalacia within the right frontal, temporal and occipital lobes compatible with remote infarct. No acute intracranial hemorrhage, mass lesion or mass-effect. Electronically Signed   By: Lovey Newcomer M.D.   On: 06/19/2016 23:28   Mr Brain Wo Contrast  Result Date: 06/20/2016 CLINICAL DATA:  Altered mental status, last seen well yesterday morning. Slurred speech, LEFT-sided weakness. History of stroke, hypertension, diabetes. EXAM: MRI HEAD WITHOUT CONTRAST TECHNIQUE: Multiplanar, multiecho pulse sequences of the brain and surrounding structures were obtained without intravenous contrast. COMPARISON:  CT HEAD June 19, 2016 and MRI of the head August 30, 2014  FINDINGS: BRAIN: No reduced diffusion to suggest acute ischemia. No susceptibility artifact to suggest hemorrhage. RIGHT temporal occipital lobe encephalomalacia. RIGHT frontal lobe encephalomalacia. Mild ex vacuo dilatation RIGHT atrium, ventricles and sulci are overall normal for patient's age. Patchy to confluent supratentorial pontine white matter FLAIR T2 hyperintensities. Patchy FLAIR T2 hyperintense signal in the basal ganglia and thalamic. Old RIGHT basal ganglia infarct. Asymmetrically smaller RIGHT cerebral peduncle compatible with wallerian degeneration. No intraparenchymal mass or mass effect. No abnormal extra-axial fluid collections. Symmetric size, morphology and signal of the hippocampi. VASCULAR: Normal major intracranial vascular flow voids present at skull base. SKULL AND UPPER CERVICAL SPINE: No abnormal sellar expansion. No suspicious calvarial bone marrow signal. Craniocervical junction maintained. SINUSES/ORBITS: The mastoid air-cells and included paranasal sinuses are well-aerated. Status post bilateral ocular lens implants. The included ocular globes and orbital contents are non-suspicious. OTHER: Patient is edentulous.  Life-support lines in place. IMPRESSION: No acute intracranial process. Old RIGHT middle cerebral artery and RIGHT posterior cerebral artery territory infarcts. Moderate chronic small vessel ischemic disease. Electronically Signed   By: Elon Alas M.D.   On: 06/20/2016 06:24   Dg Chest Portable 1  View  Result Date: 06/19/2016 CLINICAL DATA:  Post intubation.  Respiratory failure. EXAM: PORTABLE CHEST 1 VIEW COMPARISON:  08/30/2014 CXR FINDINGS: Endotracheal tube tip is 3.2 cm above the carina in satisfactory position. Mild vascular congestion consistent with CHF. Cardiomegaly is noted. Aorta is tortuous. Patchy airspace opacities at the lung bases cannot exclude pneumonia. No suspicious osseous abnormality. IMPRESSION: Endotracheal tube tip in satisfactory  position 3.2 cm above the carina. Mild CHF. Patchy airspace disease in both lower lobes cannot exclude pneumonia. Electronically Signed   By: Ashley Royalty M.D.   On: 06/19/2016 23:15     CBC  Recent Labs Lab 06/19/16 2235 06/19/16 2255 06/20/16 0157  WBC 8.5  --  10.2  HGB 13.4 13.9 13.4  HCT 40.4 41.0 42.4  PLT 252  --  243  MCV 85.4  --  86.2  MCH 28.3  --  27.2  MCHC 33.2  --  31.6  RDW 16.5*  --  16.5*  LYMPHSABS 1.3  --  0.8  MONOABS 0.4  --  0.6  EOSABS 0.3  --  0.0  BASOSABS 0.0  --  0.0    Chemistries   Recent Labs Lab 06/19/16 2235 06/19/16 2255 06/20/16 0157 06/23/16 0511  NA 141 142 140 142  K 4.5 4.4 4.5 3.6  CL 110 109 111 112*  CO2 16*  --  18* 23  GLUCOSE 144* 153* 169* 122*  BUN 17 20 17  21*  CREATININE 1.40* 1.40* 1.40* 1.36*  CALCIUM 8.7*  --  8.7* 8.6*  MG  --   --  1.9  --   AST 18  --   --   --   ALT 13*  --   --   --   ALKPHOS 72  --   --   --   BILITOT 0.3  --   --   --    ------------------------------------------------------------------------------------------------------------------ estimated creatinine clearance is 38.1 mL/min (by C-G formula based on SCr of 1.36 mg/dL (H)). ------------------------------------------------------------------------------------------------------------------ No results for input(s): HGBA1C in the last 72 hours. ------------------------------------------------------------------------------------------------------------------  Recent Labs  06/23/16 0511  TRIG 94   ------------------------------------------------------------------------------------------------------------------ No results for input(s): TSH, T4TOTAL, T3FREE, THYROIDAB in the last 72 hours.  Invalid input(s): FREET3 ------------------------------------------------------------------------------------------------------------------ No results for input(s): VITAMINB12, FOLATE, FERRITIN, TIBC, IRON, RETICCTPCT in the last 72  hours.  Coagulation profile  Recent Labs Lab 06/19/16 2235  INR 1.01    No results for input(s): DDIMER in the last 72 hours.  Cardiac Enzymes No results for input(s): CKMB, TROPONINI, MYOGLOBIN in the last 168 hours.  Invalid input(s): CK ------------------------------------------------------------------------------------------------------------------ Invalid input(s): POCBNP   CBG:  Recent Labs Lab 06/22/16 0830 06/22/16 1300 06/22/16 1638 06/22/16 2125 06/23/16 0610  GLUCAP 107* 105* 108* 108* 117*       Studies: No results found.    Lab Results  Component Value Date   HGBA1C 6.7 (H) 02/25/2016   HGBA1C 6.9 (H) 12/08/2015   HGBA1C 7.2 (H) 06/10/2015   Lab Results  Component Value Date   MICROALBUR 48.8 (H) 05/14/2014   LDLCALC 48 12/08/2015   CREATININE 1.36 (H) 06/23/2016       Scheduled Meds: . aspirin  324 mg Oral Daily  . atorvastatin  20 mg Oral q1800  . clopidogrel  75 mg Oral Daily  . enoxaparin (LOVENOX) injection  40 mg Subcutaneous Q24H  . hydrALAZINE  50 mg Oral Q6H  . insulin aspart  0-15 Units Subcutaneous TID AC & HS  . isosorbide mononitrate  60 mg Oral Daily  . levETIRAcetam  500 mg Intravenous Q12H  . mouth rinse  15 mL Mouth Rinse BID  . metoprolol tartrate  50 mg Oral BID   Continuous Infusions:   LOS: 3 days    Time spent: >30 MINS    [facility]  [facility] Pager [phone]. If 7PM-7AM, please contact night-coverage at [email or web address], password [facility] 06/23/2016, 9:29 AM  LOS: 3 days

## 2016-06-23 NOTE — Progress Notes (Addendum)
Speech Language Pathology Treatment: Dysphagia  Patient Details Name: Kayla [name] MRN: [id number] DOB: Mar 10, 1941 Today's Date: 06/23/2016 Time: 6203-5597 SLP Time Calculation (min) (ACUTE ONLY): 13 min  Assessment / Plan / Recommendation Clinical Impression  Patient with denture present today, more awake and alert, but she required encouragement to participate in eating; however, when she did she consumed regular textures and thin liquids via straw with no overt s/s of aspiration and timely oral clearance.  Patient back to baseline swallow function as a result, no further skilled SLP services are warranted at this time for dysphagia.  Of note, patient will require set-up assist and encouragement for PO from a cognitive standpoint (no cog-ling orders at this time).    HPI HPI: 76 year old female with history of ischemic CVA, presents with symptoms of new CVA with left sided weakness, gaze preference and witnessed seizure.No new medication changes or trauma. No further seizures. Passed SBT. MRI neg acute findings. Extubated 1/1. Still a little confused but getting better.      SLP Plan  All goals met     Recommendations  Diet recommendations: Regular;Thin liquid Liquids provided via: Straw;Cup Medication Administration: Whole meds with liquid Supervision: Patient able to self feed;Intermittent supervision to cue for compensatory strategies (for set-up and encouragement ) Compensations: Minimize environmental distractions;Slow rate;Small sips/bites Postural Changes and/or Swallow Maneuvers: Seated upright 90 degrees                Oral Care Recommendations: Oral care BID Follow up Recommendations: None Plan: All goals met       GO              Carmelia Roller., CCC-SLP [phone]   Sueellen Kayes 06/23/2016, 10:47 AM

## 2016-06-23 NOTE — Progress Notes (Signed)
Subjective: No further seizures.   Family has noted that they believe that she has cognitive decline over the past 2 years, along with her depression and fibromyalgia and somatic dysfunction this could be some of the causative etiology of why she is waxing and waning with lethargy. Family seems a little over concerned.  Exam: Vitals:   06/23/16 0452 06/23/16 0945  BP: (!) 142/93 (!) 154/59  Pulse: 75 81  Resp: 18 17  Temp: 98.2 F (36.8 C) 98.2 F (36.8 C)        Gen: In bed, NAD MS: Patient is lethargic, is having more than usual difficult time following my verbal commands even when I show her what to do. If patient's family members become involve she is able to follow commands without difficulty. CN: Patient has a left hemianopsia. Pupils are equal round reactive to light and face symmetrical. Motor: Moving all extremities antigravity however patient's effort is minimal and less agitated than she shows good strength.. Sensory: Sensation is grossly intact   Pertinent Labs/Diagnostics: None   Impression: 76 yo F with new onset seizures in the setting of previous cortical infarct. Likely secondary to cortical infarct. EEG without evidence of ongoing seizure and no further clinical seizures. I strongly suspect some underlying dementia.   Recommendations: 1) continue keppra 500mg  BID 2) at this point no further recommendations per neurology. Dr. Leonel Ramsay will addend the note 3) Per [facility] statutes, patients with seizures are not allowed to drive until  they have been seizure-free for six months. Use caution when using heavy equipment or power tools. Avoid working on ladders or at heights. Take showers instead of baths. Ensure the water temperature is not too high on the home water heater. Do not go swimming alone. When caring for infants or small children, sit down when holding, feeding, or changing them to minimize risk of injury to the child in the event you have a  seizure.   Etta Quill PA-C [facility] [phone]  06/23/2016, 10:46 AM   I have seen the patient and reviewed the above note. I strongly suspect she has some underlying dementia. Of note her B12 was borderline at 228, which is actually very much on the low side. I will send methylmalonic acid. I'll give a single dose of B12, if her methylmalonic acid comes back positive, then this will need to be continued weekly for 4 doses as well as monthly thereafter.  1) B12 injection 1000 g 1 2) methylmalonic acid, further treatment for B12 deficiency if indicated per internal medicine. 3) continue Keppra 500 mg twice a day 4) no further recommendations at this time, neurology to sign off please call with any further questions or concerns.  Roland Rack, MD [facility] [phone]  If 7pm- 7am, please page neurology on call as listed in [place].

## 2016-06-24 ENCOUNTER — Ambulatory Visit (INDEPENDENT_AMBULATORY_CARE_PROVIDER_SITE_OTHER): Payer: Medicare HMO | Admitting: *Deleted

## 2016-06-24 DIAGNOSIS — I638 Other cerebral infarction: Secondary | ICD-10-CM | POA: Diagnosis not present

## 2016-06-24 DIAGNOSIS — I6389 Other cerebral infarction: Secondary | ICD-10-CM

## 2016-06-24 DIAGNOSIS — J9600 Acute respiratory failure, unspecified whether with hypoxia or hypercapnia: Secondary | ICD-10-CM

## 2016-06-24 LAB — COMPREHENSIVE METABOLIC PANEL
ALT: 12 U/L — ABNORMAL LOW (ref 14–54)
AST: 18 U/L (ref 15–41)
Albumin: 3.3 g/dL — ABNORMAL LOW (ref 3.5–5.0)
Alkaline Phosphatase: 65 U/L (ref 38–126)
Anion gap: 10 (ref 5–15)
BILIRUBIN TOTAL: 0.5 mg/dL (ref 0.3–1.2)
BUN: 21 mg/dL — AB (ref 6–20)
CHLORIDE: 108 mmol/L (ref 101–111)
CO2: 20 mmol/L — ABNORMAL LOW (ref 22–32)
Calcium: 8.7 mg/dL — ABNORMAL LOW (ref 8.9–10.3)
Creatinine, Ser: 1.23 mg/dL — ABNORMAL HIGH (ref 0.44–1.00)
GFR calc Af Amer: 48 mL/min — ABNORMAL LOW (ref >60)
GFR, EST NON AFRICAN AMERICAN: 42 mL/min — AB (ref >60)
Glucose, Bld: 133 mg/dL — ABNORMAL HIGH (ref 65–99)
POTASSIUM: 3.9 mmol/L (ref 3.5–5.1)
Sodium: 138 mmol/L (ref 135–145)
TOTAL PROTEIN: 6.7 g/dL (ref 6.5–8.1)

## 2016-06-24 LAB — GLUCOSE, CAPILLARY
GLUCOSE-CAPILLARY: 118 mg/dL — AB (ref 65–99)
GLUCOSE-CAPILLARY: 126 mg/dL — AB (ref 65–99)
GLUCOSE-CAPILLARY: 117 mg/dL — AB (ref 65–99)
Glucose-Capillary: 106 mg/dL — ABNORMAL HIGH (ref 65–99)

## 2016-06-24 MED ORDER — CYANOCOBALAMIN 1000 MCG/ML IJ SOLN: 1000.0000 ug | INTRAMUSCULAR | 0 refills | Status: AC

## 2016-06-24 MED ORDER — VITAMIN B-12 1000 MCG PO TABS: 1000.0000 ug | ORAL_TABLET | Freq: Every day | ORAL | 12 refills | Status: DC

## 2016-06-24 MED ORDER — LEVETIRACETAM 500 MG PO TABS: 500.0000 mg | ORAL_TABLET | Freq: Two times a day (BID) | ORAL | 3 refills | Status: DC

## 2016-06-24 MED ORDER — GLIPIZIDE ER 2.5 MG PO TB24: 2.5000 mg | ORAL_TABLET | Freq: Every day | ORAL | 2 refills | Status: DC

## 2016-06-24 MED ORDER — HYDRALAZINE HCL 50 MG PO TABS: 50.0000 mg | ORAL_TABLET | Freq: Four times a day (QID) | ORAL | 3 refills | Status: DC

## 2016-06-24 NOTE — Discharge Instructions (Signed)
not allowed to drive until  they have been seizure-free for six months. Use caution when using heavy equipment or power tools. Avoid working on ladders or at heights. Take showers instead of baths. Ensure the water temperature is not too high on the home water heater. Do not go swimming alone. When caring for infants or small children, sit down when holding, feeding, or changing them to minimize risk of injury to the child in the event you have a seizure.

## 2016-06-24 NOTE — NC FL2 (Signed)
[place] LEVEL OF CARE SCREENING TOOL     IDENTIFICATION  Patient Name: Kayla [name] Birthdate: [birth year] Sex: female Admission Date (Current Location): 06/19/2016  [facility] and [place] Number:  Herbalist and Address:  [facility], [place] [address], [place], [place] [address]      Provider Number: [id number]  Attending Physician Name and Address:  Reyne Dumas, MD  Relative Name and Phone Number:       Current Level of Care: Hospital Recommended Level of Care: [place] Prior Approval Number:    Date Approved/Denied:   PASRR Number:    Discharge Plan: SNF    Current Diagnoses: Patient Active Problem List   Diagnosis Date Noted  . Acute respiratory failure ([place])   . Encephalopathy 06/20/2016  . Seizure as late effect of cerebrovascular accident (CVA) ([place])   . Allergic reaction 02/26/2016  . Blood in stool 02/26/2016  . Decreased strength, endurance, and mobility 09/25/2015  . Adjustment disorder with depressed mood 07/27/2015  . Left homonymous hemianopsia 09/06/2014  . Occipital infarction ([place]) 09/05/2014  . Acute ischemic right MCA stroke ([place]) 08/30/2014  . Acute right PCA stroke ([place]) 08/30/2014  . Abnormal chest x-ray 08/22/2014  . Type 2 diabetes mellitus with diabetic neuropathy ([place]) 01/04/2014  . HYPERCHOLESTEROLEMIA, MILD 05/22/2009  . PERS HX NONCOMPLIANCE W/MED TX PRS HAZARDS HLTH 07/11/2007  . Essential hypertension 05/04/2007  . Osteoarthritis 05/04/2007  . Fibromyalgia 05/04/2007    Orientation RESPIRATION BLADDER Height & Weight     Self, Situation, Place  Normal Incontinent Weight: 179 lb 6.4 oz (81.4 kg) Height:  5\' 7"  (170.2 cm)  BEHAVIORAL SYMPTOMS/MOOD NEUROLOGICAL BOWEL NUTRITION STATUS      Incontinent Diet (Regular Diet)  AMBULATORY STATUS COMMUNICATION OF NEEDS Skin   Extensive Assist Verbally Normal                       Personal Care Assistance Level of  Assistance  Bathing, Dressing, Feeding Bathing Assistance: Limited assistance Feeding assistance: Independent Dressing Assistance: Limited assistance     Functional Limitations Info  Sight, Hearing, Speech Sight Info: Adequate Hearing Info: Adequate Speech Info: Adequate    SPECIAL CARE FACTORS FREQUENCY  PT (By licensed PT)     PT Frequency: 5              Contractures Contractures Info: Not present    Additional Factors Info  Code Status, Allergies, Insulin Sliding Scale Code Status Info: Full Code Allergies Info: Amlodipine, Fish Allergy, Penicillins, Shellfish Allergy, Peanuts Peanut Oil, Tomato   Insulin Sliding Scale Info: 4x/day       Current Medications (06/24/2016):  This is the current hospital active medication list Current Facility-Administered Medications  Medication Dose Route Frequency Provider Last Rate Last Dose  . aspirin chewable tablet 324 mg  324 mg Oral Daily [name], NP   324 mg at 06/24/16 1010  . atorvastatin (LIPITOR) tablet 20 mg  20 mg Oral q1800 [name], NP   20 mg at 06/23/16 1801  . clopidogrel (PLAVIX) tablet 75 mg  75 mg Oral Daily [name], NP   75 mg at 06/24/16 0930  . enoxaparin (LOVENOX) injection 40 mg  40 mg Subcutaneous Q24H Nilda Calamity, DO   40 mg at 06/24/16 0930  . hydrALAZINE (APRESOLINE) injection 20 mg  20 mg Intravenous Q4H PRN [name], NP   20 mg at 06/24/16 0540  .  hydrALAZINE (APRESOLINE) tablet 50 mg  50 mg Oral Q6H [name], NP   50 mg at 06/24/16 1223  . insulin aspart (novoLOG) injection 0-15 Units  0-15 Units Subcutaneous TID AC & HS [name], NP   2 Units at 06/24/16 1224  . isosorbide mononitrate (IMDUR) 24 hr tablet 60 mg  60 mg Oral Daily [name], NP   60 mg at 06/24/16 0930  . levETIRAcetam (KEPPRA) tablet 500 mg  500 mg Oral BID Reyne Dumas, MD   500 mg at 06/24/16 0930  . MEDLINE mouth rinse  15 mL Mouth Rinse BID Collene Gobble, MD   15 mL at  06/23/16 2115  . menthol-cetylpyridinium (CEPACOL) lozenge 3 mg  1 lozenge Oral PRN Reyne Dumas, MD      . metoprolol (LOPRESSOR) tablet 50 mg  50 mg Oral BID [name], NP   50 mg at 06/24/16 0930  . polyethylene glycol (MIRALAX / GLYCOLAX) packet 17 g  17 g Oral Daily Reyne Dumas, MD   17 g at 06/24/16 0930     Discharge Medications: Please see discharge summary for a list of discharge medications.  Relevant Imaging Results:  Relevant Lab Results:   Additional Information SSN:  [id number]  Darden Dates, LCSW

## 2016-06-24 NOTE — Care Management Important Message (Signed)
Important Message  Patient Details  Name: Kayla [name] MRN: [id number] Date of Birth: 25-Jul-1940   Medicare Important Message Given:  Yes    Britania Shreeve [address] 06/24/2016, 1:39 PM

## 2016-06-24 NOTE — Discharge Summary (Signed)
Physician Discharge Summary  ANACLARA ACKLIN MRN: [id number] DOB/AGE: [birth year] 77 y.o.  PCP: Hoyt Koch, MD   Admit date: 06/19/2016 Discharge date: 06/24/2016  Discharge Diagnoses:    Active Problems:   Encephalopathy   Seizure as late effect of cerebrovascular accident (CVA) ([place])   Acute respiratory failure ([employer])    Follow-up recommendations Follow-up with PCP in 3-5 days , including all  additional recommended appointments as below Follow-up CBC, CMP in 3-5 days Activity instructions provided to the patient in writing-no driving for 6 months Neurology recommends aggressive repletion of vitamin B-12 and weekly injections       Current Discharge Medication List    START taking these medications   Details  cyanocobalamin (,VITAMIN B-12,) 1000 MCG/ML injection Inject 1 mL (1,000 mcg total) into the muscle once a week. Qty: 1 mL, Refills: 0    glipiZIDE (GLUCOTROL XL) 2.5 MG 24 hr tablet Take 1 tablet (2.5 mg total) by mouth daily with breakfast. Qty: 30 tablet, Refills: 2    hydrALAZINE (APRESOLINE) 50 MG tablet Take 1 tablet (50 mg total) by mouth 4 (four) times daily. Qty: 120 tablet, Refills: 3    levETIRAcetam (KEPPRA) 500 MG tablet Take 1 tablet (500 mg total) by mouth 2 (two) times daily. Qty: 60 tablet, Refills: 3      CONTINUE these medications which have CHANGED   Details  vitamin B-12 (CYANOCOBALAMIN) 1000 MCG tablet Take 1 tablet (1,000 mcg total) by mouth daily. Qty: 30 tablet, Refills: 12      CONTINUE these medications which have NOT CHANGED   Details  aspirin 325 MG tablet Take 1 tablet (325 mg total) by mouth daily. Qty: 30 tablet, Refills: 2    atorvastatin (LIPITOR) 20 MG tablet Take 1 tablet (20 mg total) by mouth daily at 6 PM. Qty: 30 tablet, Refills: 3    cetirizine (ZYRTEC) 10 MG tablet Take 10 mg by mouth daily.    clopidogrel (PLAVIX) 75 MG tablet Take 75 mg by mouth daily. Reported on 06/06/2015    glucose blood  (ACCU-CHEK AVIVA) test strip Check blood sugar once daily Qty: 100 each, Refills: 4   Associated Diagnoses: Diabetes mellitus without complication (HCC)    isosorbide mononitrate (IMDUR) 60 MG 24 hr tablet Take 60 mg by mouth daily.     lisinopril (PRINIVIL,ZESTRIL) 20 MG tablet Take 20 mg by mouth daily.    metoprolol (LOPRESSOR) 50 MG tablet Take 50 mg by mouth 2 (two) times daily. Reported on 12/08/2015    nitroGLYCERIN (NITROSTAT) 0.4 MG SL tablet Place 0.4 mg under the tongue every 5 (five) minutes as needed for chest pain.    polyethylene glycol (MIRALAX / GLYCOLAX) packet Take 17 g by mouth daily as needed for moderate constipation.    mirtazapine (REMERON) 15 MG tablet TAKE 1 TABLET(15 MG) BY MOUTH AT BEDTIME Qty: 90 tablet, Refills: 3    nitrofurantoin, macrocrystal-monohydrate, (MACROBID) 100 MG capsule Take 1 capsule (100 mg total) by mouth 2 (two) times daily. Qty: 14 capsule, Refills: 0      STOP taking these medications     metFORMIN (GLUCOPHAGE) 500 MG tablet      metFORMIN (GLUCOPHAGE) 500 MG tablet      predniSONE (DELTASONE) 20 MG tablet          Discharge Condition: Home health   Discharge Instructions Get Medicines reviewed and adjusted: Please take all your medications with you for your next visit with your Primary MD  Please request your  Primary MD to go over all hospital tests and procedure/radiological results at the follow up, please ask your Primary MD to get all Hospital records sent to his/her office.  If you experience worsening of your admission symptoms, develop shortness of breath, life threatening emergency, suicidal or homicidal thoughts you must seek medical attention immediately by calling 911 or calling your MD immediately if symptoms less severe.  You must read complete instructions/literature along with all the possible adverse reactions/side effects for all the Medicines you take and that have been prescribed to you. Take any new  Medicines after you have completely understood and accpet all the possible adverse reactions/side effects.   Do not drive when taking Pain medications.   Do not take more than prescribed Pain, Sleep and Anxiety Medications  Special Instructions: If you have smoked or chewed Tobacco in the last 2 yrs please stop smoking, stop any regular Alcohol and or any Recreational drug use.  Wear Seat belts while driving.  Please note  You were cared for by a hospitalist during your hospital stay. Once you are discharged, your primary care physician will handle any further medical issues. Please note that NO REFILLS for any discharge medications will be authorized once you are discharged, as it is imperative that you return to your primary care physician (or establish a relationship with a primary care physician if you do not have one) for your aftercare needs so that they can reassess your need for medications and monitor your lab values.     Allergies  Allergen Reactions  . Amlodipine Shortness Of Breath and Rash  . Fish Allergy Hives  . Penicillins Other (See Comments)    REACTION: red rash  . Shellfish Allergy Hives  . Peanuts [Peanut Oil] Rash  . Tomato Rash      Disposition: Home with home health   Consults:  Neurology  Significant Diagnostic Studies:  Ct Head Wo Contrast  Result Date: 06/19/2016 CLINICAL DATA:  Patient with seizure.  Altered mental status. EXAM: CT HEAD WITHOUT CONTRAST TECHNIQUE: Contiguous axial images were obtained from the base of the skull through the vertex without intravenous contrast. COMPARISON:  CT brain 09/01/2014. FINDINGS: Brain: Ventricles and sulci are prominent. Encephalomalacia demonstrated within the right occipital lobe. Old right frontal periventricular infarct. Extensive low-attenuation throughout the right cerebral hemisphere white matter. No evidence for intracranial hemorrhage, mass lesion or mass-effect. Vascular: Internal carotid arterial  vascular calcifications. Skull: Intact. Sinuses/Orbits: Mastoid air cells unremarkable. Paranasal sinuses are well aerated. Other: None. IMPRESSION: Encephalomalacia within the right frontal, temporal and occipital lobes compatible with remote infarct. No acute intracranial hemorrhage, mass lesion or mass-effect. Electronically Signed   By: Lovey Newcomer M.D.   On: 06/19/2016 23:28   Mr Brain Wo Contrast  Result Date: 06/20/2016 CLINICAL DATA:  Altered mental status, last seen well yesterday morning. Slurred speech, LEFT-sided weakness. History of stroke, hypertension, diabetes. EXAM: MRI HEAD WITHOUT CONTRAST TECHNIQUE: Multiplanar, multiecho pulse sequences of the brain and surrounding structures were obtained without intravenous contrast. COMPARISON:  CT HEAD June 19, 2016 and MRI of the head August 30, 2014 FINDINGS: BRAIN: No reduced diffusion to suggest acute ischemia. No susceptibility artifact to suggest hemorrhage. RIGHT temporal occipital lobe encephalomalacia. RIGHT frontal lobe encephalomalacia. Mild ex vacuo dilatation RIGHT atrium, ventricles and sulci are overall normal for patient's age. Patchy to confluent supratentorial pontine white matter FLAIR T2 hyperintensities. Patchy FLAIR T2 hyperintense signal in the basal ganglia and thalamic. Old RIGHT basal ganglia infarct. Asymmetrically smaller RIGHT  cerebral peduncle compatible with wallerian degeneration. No intraparenchymal mass or mass effect. No abnormal extra-axial fluid collections. Symmetric size, morphology and signal of the hippocampi. VASCULAR: Normal major intracranial vascular flow voids present at skull base. SKULL AND UPPER CERVICAL SPINE: No abnormal sellar expansion. No suspicious calvarial bone marrow signal. Craniocervical junction maintained. SINUSES/ORBITS: The mastoid air-cells and included paranasal sinuses are well-aerated. Status post bilateral ocular lens implants. The included ocular globes and orbital contents are  non-suspicious. OTHER: Patient is edentulous.  Life-support lines in place. IMPRESSION: No acute intracranial process. Old RIGHT middle cerebral artery and RIGHT posterior cerebral artery territory infarcts. Moderate chronic small vessel ischemic disease. Electronically Signed   By: Elon Alas M.D.   On: 06/20/2016 06:24   Dg Chest Portable 1 View  Result Date: 06/19/2016 CLINICAL DATA:  Post intubation.  Respiratory failure. EXAM: PORTABLE CHEST 1 VIEW COMPARISON:  08/30/2014 CXR FINDINGS: Endotracheal tube tip is 3.2 cm above the carina in satisfactory position. Mild vascular congestion consistent with CHF. Cardiomegaly is noted. Aorta is tortuous. Patchy airspace opacities at the lung bases cannot exclude pneumonia. No suspicious osseous abnormality. IMPRESSION: Endotracheal tube tip in satisfactory position 3.2 cm above the carina. Mild CHF. Patchy airspace disease in both lower lobes cannot exclude pneumonia. Electronically Signed   By: Ashley Royalty M.D.   On: 06/19/2016 23:15         Filed Weights   06/22/16 0400 06/23/16 0600 06/24/16 0518  Weight: 80.8 kg (178 lb 2.1 oz) 79.1 kg (174 lb 4.8 oz) 81.4 kg (179 lb 6.4 oz)     Microbiology: Recent Results (from the past 240 hour(s))  MRSA PCR Screening     Status: None   Collection Time: 06/20/16  2:50 AM  Result Value Ref Range Status   MRSA by PCR NEGATIVE NEGATIVE Final    Comment:        The GeneXpert MRSA Assay (FDA approved for NASAL specimens only), is one component of a comprehensive MRSA colonization surveillance program. It is not intended to diagnose MRSA infection nor to guide or monitor treatment for MRSA infections.        Blood Culture    Component Value Date/Time   SDES URINE, CLEAN CATCH 09/11/2014 2030   [place] NONE 09/11/2014 2030   CULT  09/11/2014 2030    GROUP B STREP(S.AGALACTIAE)ISOLATED Note: TESTING AGAINST S. AGALACTIAE NOT ROUTINELY PERFORMED DUE TO PREDICTABILITY OF AMP/PEN/VAN  SUSCEPTIBILITY. Performed at [facility] 09/13/2014 FINAL 09/11/2014 2030      Labs: Results for orders placed or performed during the hospital encounter of 06/19/16 (from the past 48 hour(s))  Glucose, capillary     Status: Abnormal   Collection Time: 06/22/16  1:00 PM  Result Value Ref Range   Glucose-Capillary 105 (H) 65 - 99 mg/dL   Comment 1 Notify RN    Comment 2 Document in Chart   Glucose, capillary     Status: Abnormal   Collection Time: 06/22/16  4:38 PM  Result Value Ref Range   Glucose-Capillary 108 (H) 65 - 99 mg/dL   Comment 1 Notify RN    Comment 2 Document in Chart   Glucose, capillary     Status: Abnormal   Collection Time: 06/22/16  9:25 PM  Result Value Ref Range   Glucose-Capillary 108 (H) 65 - 99 mg/dL   Comment 1 Notify RN    Comment 2 Document in Chart   Basic metabolic panel     Status:  Abnormal   Collection Time: 06/23/16  5:11 AM  Result Value Ref Range   Sodium 142 135 - 145 mmol/L   Potassium 3.6 3.5 - 5.1 mmol/L   Chloride 112 (H) 101 - 111 mmol/L   CO2 23 22 - 32 mmol/L   Glucose, Bld 122 (H) 65 - 99 mg/dL   BUN 21 (H) 6 - 20 mg/dL   Creatinine, Ser 1.36 (H) 0.44 - 1.00 mg/dL   Calcium 8.6 (L) 8.9 - 10.3 mg/dL   GFR calc non Af Amer 37 (L) >60 mL/min   GFR calc Af Amer 43 (L) >60 mL/min    Comment: (NOTE) The eGFR has been calculated using the CKD EPI equation. This calculation has not been validated in all clinical situations. eGFR's persistently <60 mL/min signify possible Chronic Kidney Disease.    Anion gap 7 5 - 15  Triglycerides     Status: None   Collection Time: 06/23/16  5:11 AM  Result Value Ref Range   Triglycerides 94 <150 mg/dL  Glucose, capillary     Status: Abnormal   Collection Time: 06/23/16  6:10 AM  Result Value Ref Range   Glucose-Capillary 117 (H) 65 - 99 mg/dL   Comment 1 Notify RN    Comment 2 Document in Chart   Glucose, capillary     Status: Abnormal   Collection Time: 06/23/16  11:21 AM  Result Value Ref Range   Glucose-Capillary 110 (H) 65 - 99 mg/dL   Comment 1 Notify RN    Comment 2 Document in Chart   Glucose, capillary     Status: Abnormal   Collection Time: 06/23/16  4:40 PM  Result Value Ref Range   Glucose-Capillary 119 (H) 65 - 99 mg/dL   Comment 1 Notify RN    Comment 2 Document in Chart   Glucose, capillary     Status: Abnormal   Collection Time: 06/23/16  9:08 PM  Result Value Ref Range   Glucose-Capillary 129 (H) 65 - 99 mg/dL   Comment 1 Notify RN    Comment 2 Document in Chart   Glucose, capillary     Status: Abnormal   Collection Time: 06/24/16  6:38 AM  Result Value Ref Range   Glucose-Capillary 118 (H) 65 - 99 mg/dL   Comment 1 Notify RN    Comment 2 Document in Chart   Comprehensive metabolic panel     Status: Abnormal   Collection Time: 06/24/16  7:09 AM  Result Value Ref Range   Sodium 138 135 - 145 mmol/L   Potassium 3.9 3.5 - 5.1 mmol/L   Chloride 108 101 - 111 mmol/L   CO2 20 (L) 22 - 32 mmol/L   Glucose, Bld 133 (H) 65 - 99 mg/dL   BUN 21 (H) 6 - 20 mg/dL   Creatinine, Ser 1.23 (H) 0.44 - 1.00 mg/dL   Calcium 8.7 (L) 8.9 - 10.3 mg/dL   Total Protein 6.7 6.5 - 8.1 g/dL   Albumin 3.3 (L) 3.5 - 5.0 g/dL   AST 18 15 - 41 U/L   ALT 12 (L) 14 - 54 U/L   Alkaline Phosphatase 65 38 - 126 U/L   Total Bilirubin 0.5 0.3 - 1.2 mg/dL   GFR calc non Af Amer 42 (L) >60 mL/min   GFR calc Af Amer 48 (L) >60 mL/min    Comment: (NOTE) The eGFR has been calculated using the CKD EPI equation. This calculation has not been validated in all clinical situations. eGFR's  persistently <60 mL/min signify possible Chronic Kidney Disease.    Anion gap 10 5 - 15     Lipid Panel     Component Value Date/Time   CHOL 100 12/08/2015 1010   TRIG 94 06/23/2016 0511   HDL 32.20 (L) 12/08/2015 1010   CHOLHDL 3 12/08/2015 1010   VLDL 19.2 12/08/2015 1010   LDLCALC 48 12/08/2015 1010     Lab Results  Component Value Date   HGBA1C 6.7 (H)  02/25/2016   HGBA1C 6.9 (H) 12/08/2015   HGBA1C 7.2 (H) 06/10/2015       HPI :  76 y.o.femalewith ahistory right cerebral infarctions, diabetes mellitus, hypertension, hyperlipidemia and coronary artery disease, brought to the emergency room with altered mental status. She was last seen well at 9:30 a.m. When family returned at 7:30 PM she was noted to have slurred speech and slumped to the left side, as well as staring to the left side. After arriving in the emergency room mental status improved. She was alert and answering questions appropriately for a while, then suddenly stopped talking and became tense and demonstrated a generalized seizure. She was given Ativan 2 mg and subsequently intubated for airway protection. She was also started on propofol drip IV. No further seizure activity was reported. CT scan of her head showed old right occipital, posterior temporal and frontal infarctions  HOSPITAL COURSE:    New-onset seizure in the setting of cortical infarct EEG without evidence of ongoing seizure and no further clinical seizures.  continue keppra 564m BID Dysphagia 3 (Mech soft);Thin liquid No new CVA seen on MRI brain 12/31 Denied CIR, SNF versus home health not allowed to drive until  they have been seizure-free for six months Family refused SNF and would like to take the patient home with home health  Vitamin B-12 deficiency  B12 was borderline at 228 in September 2017, neurology sent methylmalonic acid. Started on IM B12, if her methylmalonic acid comes back positive, then this will need to be continued weekly for 4 doses as well as monthly thereafter.  Acute respiratory failure, intubated for airway protection due to seizure activity Possible pneumonia, levofloxacin stopped 12/31 passed SBT & extubated 1/1    history of coronary ischemia s/p DES, see above Continue aspirin and Plavix  Hypertension Antihypertensive medications adjusted  Chronic kidney disease,  stage III-baseline creatinine 1.3 Stable  Diabetes mellitus-continue sliding scale insulin. Most recent hemoglobin A1c 6.7 Metformin discontinued because of a creatinine of 1.4 Replace with glipizide 2.5 mg a day   Acute encephalopathy secondary to seizure-resolved   Discharge Exam:  Blood pressure (!) 179/94, pulse 74, temperature 98.1 F (36.7 C), temperature source Oral, resp. rate 18, height _0  (1.702 m), weight 81.4 kg (179 lb 6.4 oz), SpO2 100 %.  General exam: Appears calm and comfortable  Respiratory system: Clear to auscultation. Respiratory effort normal. Cardiovascular system: S1 & S2 heard, RRR. No JVD, murmurs, rubs, gallops or clicks. No pedal edema. Gastrointestinal system: Abdomen is nondistended, soft and nontender. No organomegaly or masses felt. Normal bowel sounds heard. Central nervous system: Alert and oriented. No focal neurological deficits. Extremities: Symmetric 5 x 5 power. Skin: No rashes, lesions or ulcers Psychiatry: Judgement and insight appear normal. Mood & affect appropriate.      Follow-up Information    EHoyt Koch MD. Call.   Specialty:  Internal Medicine Why:  See primary care provider in 3-5 days, hospital follow-up  Contact information: [id number]  [phone]           Signed: Reyne Dumas 06/24/2016, 9:57 AM        Time spent >45 mins

## 2016-06-25 ENCOUNTER — Ambulatory Visit: Payer: Medicare HMO | Admitting: Physical Therapy

## 2016-06-25 LAB — GLUCOSE, CAPILLARY
Glucose-Capillary: 114 mg/dL — ABNORMAL HIGH (ref 65–99)
Glucose-Capillary: 138 mg/dL — ABNORMAL HIGH (ref 65–99)
Glucose-Capillary: 103 mg/dL — ABNORMAL HIGH (ref 65–99)

## 2016-06-25 LAB — METHYLMALONIC ACID, SERUM: METHYLMALONIC ACID, QUANTITATIVE: 241 nmol/L (ref 0–378)

## 2016-06-25 NOTE — Discharge Summary (Signed)
Physician Discharge Summary  Kayla [name] MRN: [id number] DOB/AGE: [id number] y.o.  PCP: Hoyt Koch, MD   Admit date: 06/19/2016 Discharge date: 06/25/2016  Discharge Diagnoses:    Active Problems:   Encephalopathy   Seizure as late effect of cerebrovascular accident (CVA) ([place])   Acute respiratory failure ([place])    Follow-up recommendations Follow-up with PCP in 3-5 days , including all  additional recommended appointments as below Follow-up CBC, CMP in 3-5 days Activity instructions provided to the patient in writing-no driving for 6 months Neurology recommends aggressive repletion of vitamin B-12 and weekly injections       Current Discharge Medication List    START taking these medications   Details  cyanocobalamin (,VITAMIN B-12,) 1000 MCG/ML injection Inject 1 mL (1,000 mcg total) into the muscle once a week. Qty: 1 mL, Refills: 0    glipiZIDE (GLUCOTROL XL) 2.5 MG 24 hr tablet Take 1 tablet (2.5 mg total) by mouth daily with breakfast. Qty: 30 tablet, Refills: 2    hydrALAZINE (APRESOLINE) 50 MG tablet Take 1 tablet (50 mg total) by mouth 4 (four) times daily. Qty: 120 tablet, Refills: 3    levETIRAcetam (KEPPRA) 500 MG tablet Take 1 tablet (500 mg total) by mouth 2 (two) times daily. Qty: 60 tablet, Refills: 3      CONTINUE these medications which have CHANGED   Details  vitamin B-12 (CYANOCOBALAMIN) 1000 MCG tablet Take 1 tablet (1,000 mcg total) by mouth daily. Qty: 30 tablet, Refills: 12      CONTINUE these medications which have NOT CHANGED   Details  aspirin 325 MG tablet Take 1 tablet (325 mg total) by mouth daily. Qty: 30 tablet, Refills: 2    atorvastatin (LIPITOR) 20 MG tablet Take 1 tablet (20 mg total) by mouth daily at 6 PM. Qty: 30 tablet, Refills: 3    cetirizine (ZYRTEC) 10 MG tablet Take 10 mg by mouth daily.    clopidogrel (PLAVIX) 75 MG tablet Take 75 mg by mouth daily. Reported on 06/06/2015    glucose blood  (ACCU-CHEK AVIVA) test strip Check blood sugar once daily Qty: 100 each, Refills: 4   Associated Diagnoses: Diabetes mellitus without complication (HCC)    isosorbide mononitrate (IMDUR) 60 MG 24 hr tablet Take 60 mg by mouth daily.     lisinopril (PRINIVIL,ZESTRIL) 20 MG tablet Take 20 mg by mouth daily.    metoprolol (LOPRESSOR) 50 MG tablet Take 50 mg by mouth 2 (two) times daily. Reported on 12/08/2015    nitroGLYCERIN (NITROSTAT) 0.4 MG SL tablet Place 0.4 mg under the tongue every 5 (five) minutes as needed for chest pain.    polyethylene glycol (MIRALAX / GLYCOLAX) packet Take 17 g by mouth daily as needed for moderate constipation.    mirtazapine (REMERON) 15 MG tablet TAKE 1 TABLET(15 MG) BY MOUTH AT BEDTIME Qty: 90 tablet, Refills: 3    nitrofurantoin, macrocrystal-monohydrate, (MACROBID) 100 MG capsule Take 1 capsule (100 mg total) by mouth 2 (two) times daily. Qty: 14 capsule, Refills: 0      STOP taking these medications     metFORMIN (GLUCOPHAGE) 500 MG tablet      metFORMIN (GLUCOPHAGE) 500 MG tablet      predniSONE (DELTASONE) 20 MG tablet          Discharge Condition: Home health   Discharge Instructions Get Medicines reviewed and adjusted: Please take all your medications with you for your next visit with your Primary MD  Please request your  Primary MD to go over all hospital tests and procedure/radiological results at the follow up, please ask your Primary MD to get all Hospital records sent to his/her office.  If you experience worsening of your admission symptoms, develop shortness of breath, life threatening emergency, suicidal or homicidal thoughts you must seek medical attention immediately by calling 911 or calling your MD immediately if symptoms less severe.  You must read complete instructions/literature along with all the possible adverse reactions/side effects for all the Medicines you take and that have been prescribed to you. Take any new  Medicines after you have completely understood and accpet all the possible adverse reactions/side effects.   Do not drive when taking Pain medications.   Do not take more than prescribed Pain, Sleep and Anxiety Medications  Special Instructions: If you have smoked or chewed Tobacco in the last 2 yrs please stop smoking, stop any regular Alcohol and or any Recreational drug use.  Wear Seat belts while driving.  Please note  You were cared for by a hospitalist during your hospital stay. Once you are discharged, your primary care physician will handle any further medical issues. Please note that NO REFILLS for any discharge medications will be authorized once you are discharged, as it is imperative that you return to your primary care physician (or establish a relationship with a primary care physician if you do not have one) for your aftercare needs so that they can reassess your need for medications and monitor your lab values.  Discharge Instructions    Diet - low sodium heart healthy    Complete by:  As directed    Increase activity slowly    Complete by:  As directed        Allergies  Allergen Reactions  . Amlodipine Shortness Of Breath and Rash  . Fish Allergy Hives  . Penicillins Other (See Comments)    REACTION: red rash  . Shellfish Allergy Hives  . Peanuts [Peanut Oil] Rash  . Tomato Rash      Disposition: Home with home health   Consults:  Neurology  Significant Diagnostic Studies:  Ct Head Wo Contrast  Result Date: 06/19/2016 CLINICAL DATA:  Patient with seizure.  Altered mental status. EXAM: CT HEAD WITHOUT CONTRAST TECHNIQUE: Contiguous axial images were obtained from the base of the skull through the vertex without intravenous contrast. COMPARISON:  CT brain 09/01/2014. FINDINGS: Brain: Ventricles and sulci are prominent. Encephalomalacia demonstrated within the right occipital lobe. Old right frontal periventricular infarct. Extensive low-attenuation  throughout the right cerebral hemisphere white matter. No evidence for intracranial hemorrhage, mass lesion or mass-effect. Vascular: Internal carotid arterial vascular calcifications. Skull: Intact. Sinuses/Orbits: Mastoid air cells unremarkable. Paranasal sinuses are well aerated. Other: None. IMPRESSION: Encephalomalacia within the right frontal, temporal and occipital lobes compatible with remote infarct. No acute intracranial hemorrhage, mass lesion or mass-effect. Electronically Signed   By: Lovey Newcomer M.D.   On: 06/19/2016 23:28   Mr Brain Wo Contrast  Result Date: 06/20/2016 CLINICAL DATA:  Altered mental status, last seen well yesterday morning. Slurred speech, LEFT-sided weakness. History of stroke, hypertension, diabetes. EXAM: MRI HEAD WITHOUT CONTRAST TECHNIQUE: Multiplanar, multiecho pulse sequences of the brain and surrounding structures were obtained without intravenous contrast. COMPARISON:  CT HEAD June 19, 2016 and MRI of the head August 30, 2014 FINDINGS: BRAIN: No reduced diffusion to suggest acute ischemia. No susceptibility artifact to suggest hemorrhage. RIGHT temporal occipital lobe encephalomalacia. RIGHT frontal lobe encephalomalacia. Mild ex vacuo dilatation RIGHT atrium, ventricles  and sulci are overall normal for patient's age. Patchy to confluent supratentorial pontine white matter FLAIR T2 hyperintensities. Patchy FLAIR T2 hyperintense signal in the basal ganglia and thalamic. Old RIGHT basal ganglia infarct. Asymmetrically smaller RIGHT cerebral peduncle compatible with wallerian degeneration. No intraparenchymal mass or mass effect. No abnormal extra-axial fluid collections. Symmetric size, morphology and signal of the hippocampi. VASCULAR: Normal major intracranial vascular flow voids present at skull base. SKULL AND UPPER CERVICAL SPINE: No abnormal sellar expansion. No suspicious calvarial bone marrow signal. Craniocervical junction maintained. SINUSES/ORBITS: The  mastoid air-cells and included paranasal sinuses are well-aerated. Status post bilateral ocular lens implants. The included ocular globes and orbital contents are non-suspicious. OTHER: Patient is edentulous.  Life-support lines in place. IMPRESSION: No acute intracranial process. Old RIGHT middle cerebral artery and RIGHT posterior cerebral artery territory infarcts. Moderate chronic small vessel ischemic disease. Electronically Signed   By: Elon Alas M.D.   On: 06/20/2016 06:24   Dg Chest Portable 1 View  Result Date: 06/19/2016 CLINICAL DATA:  Post intubation.  Respiratory failure. EXAM: PORTABLE CHEST 1 VIEW COMPARISON:  08/30/2014 CXR FINDINGS: Endotracheal tube tip is 3.2 cm above the carina in satisfactory position. Mild vascular congestion consistent with CHF. Cardiomegaly is noted. Aorta is tortuous. Patchy airspace opacities at the lung bases cannot exclude pneumonia. No suspicious osseous abnormality. IMPRESSION: Endotracheal tube tip in satisfactory position 3.2 cm above the carina. Mild CHF. Patchy airspace disease in both lower lobes cannot exclude pneumonia. Electronically Signed   By: Ashley Royalty M.D.   On: 06/19/2016 23:15         Filed Weights   06/23/16 0600 06/24/16 0518 06/25/16 0510  Weight: 79.1 kg (174 lb 4.8 oz) 81.4 kg (179 lb 6.4 oz) 84.7 kg (186 lb 11.2 oz)     Microbiology: Recent Results (from the past 240 hour(s))  MRSA PCR Screening     Status: None   Collection Time: 06/20/16  2:50 AM  Result Value Ref Range Status   MRSA by PCR NEGATIVE NEGATIVE Final    Comment:        The GeneXpert MRSA Assay (FDA approved for NASAL specimens only), is one component of a comprehensive MRSA colonization surveillance program. It is not intended to diagnose MRSA infection nor to guide or monitor treatment for MRSA infections.        Blood Culture    Component Value Date/Time   [place] URINE, CLEAN CATCH 09/11/2014 2030   [place] NONE 09/11/2014 2030    CULT  09/11/2014 2030    GROUP B STREP(S.AGALACTIAE)ISOLATED Note: TESTING AGAINST S. AGALACTIAE NOT ROUTINELY PERFORMED DUE TO PREDICTABILITY OF AMP/PEN/VAN SUSCEPTIBILITY. Performed at New Site 09/13/2014 FINAL 09/11/2014 2030      Labs: Results for orders placed or performed during the hospital encounter of 06/19/16 (from the past 48 hour(s))  Glucose, capillary     Status: Abnormal   Collection Time: 06/23/16 11:21 AM  Result Value Ref Range   Glucose-Capillary 110 (H) 65 - 99 mg/dL   Comment 1 Notify RN    Comment 2 Document in Chart   Glucose, capillary     Status: Abnormal   Collection Time: 06/23/16  4:40 PM  Result Value Ref Range   Glucose-Capillary 119 (H) 65 - 99 mg/dL   Comment 1 Notify RN    Comment 2 Document in Chart   Glucose, capillary     Status: Abnormal   Collection Time: 06/23/16  9:08 PM  Result  Value Ref Range   Glucose-Capillary 129 (H) 65 - 99 mg/dL   Comment 1 Notify RN    Comment 2 Document in Chart   Glucose, capillary     Status: Abnormal   Collection Time: 06/24/16  6:38 AM  Result Value Ref Range   Glucose-Capillary 118 (H) 65 - 99 mg/dL   Comment 1 Notify RN    Comment 2 Document in Chart   Comprehensive metabolic panel     Status: Abnormal   Collection Time: 06/24/16  7:09 AM  Result Value Ref Range   Sodium 138 135 - 145 mmol/L   Potassium 3.9 3.5 - 5.1 mmol/L   Chloride 108 101 - 111 mmol/L   CO2 20 (L) 22 - 32 mmol/L   Glucose, Bld 133 (H) 65 - 99 mg/dL   BUN 21 (H) 6 - 20 mg/dL   Creatinine, Ser 1.23 (H) 0.44 - 1.00 mg/dL   Calcium 8.7 (L) 8.9 - 10.3 mg/dL   Total Protein 6.7 6.5 - 8.1 g/dL   Albumin 3.3 (L) 3.5 - 5.0 g/dL   AST 18 15 - 41 U/L   ALT 12 (L) 14 - 54 U/L   Alkaline Phosphatase 65 38 - 126 U/L   Total Bilirubin 0.5 0.3 - 1.2 mg/dL   GFR calc non Af Amer 42 (L) >60 mL/min   GFR calc Af Amer 48 (L) >60 mL/min    Comment: (NOTE) The eGFR has been calculated using the CKD EPI  equation. This calculation has not been validated in all clinical situations. eGFR's persistently <60 mL/min signify possible Chronic Kidney Disease.    Anion gap 10 5 - 15  Glucose, capillary     Status: Abnormal   Collection Time: 06/24/16 11:51 AM  Result Value Ref Range   Glucose-Capillary 126 (H) 65 - 99 mg/dL   Comment 1 Notify RN    Comment 2 Document in Chart   Glucose, capillary     Status: Abnormal   Collection Time: 06/24/16  4:32 PM  Result Value Ref Range   Glucose-Capillary 117 (H) 65 - 99 mg/dL   Comment 1 Notify RN    Comment 2 Document in Chart   Glucose, capillary     Status: Abnormal   Collection Time: 06/24/16 10:15 PM  Result Value Ref Range   Glucose-Capillary 106 (H) 65 - 99 mg/dL   Comment 1 Notify RN    Comment 2 Document in Chart   Glucose, capillary     Status: Abnormal   Collection Time: 06/25/16  6:37 AM  Result Value Ref Range   Glucose-Capillary 114 (H) 65 - 99 mg/dL   Comment 1 Notify RN    Comment 2 Document in Chart      Lipid Panel     Component Value Date/Time   CHOL 100 12/08/2015 1010   TRIG 94 06/23/2016 0511   HDL 32.20 (L) 12/08/2015 1010   CHOLHDL 3 12/08/2015 1010   VLDL 19.2 12/08/2015 1010   LDLCALC 48 12/08/2015 1010     Lab Results  Component Value Date   HGBA1C 6.7 (H) 02/25/2016   HGBA1C 6.9 (H) 12/08/2015   HGBA1C 7.2 (H) 06/10/2015       HPI :  76 y.o.femalewith ahistory right cerebral infarctions, diabetes mellitus, hypertension, hyperlipidemia and coronary artery disease, brought to the emergency room with altered mental status. She was last seen well at 9:30 a.m. When family returned at 7:30 PM she was noted to have slurred speech and slumped  to the left side, as well as staring to the left side. After arriving in the emergency room mental status improved. She was alert and answering questions appropriately for a while, then suddenly stopped talking and became tense and demonstrated a generalized seizure.  She was given Ativan 2 mg and subsequently intubated for airway protection. She was also started on propofol drip IV. No further seizure activity was reported. CT scan of her head showed old right occipital, posterior temporal and frontal infarctions  HOSPITAL COURSE:    New-onset seizure in the setting of cortical infarct EEG without evidence of ongoing seizure and no further clinical seizures.  continue keppra 514m BID Dysphagia 3 (Mech soft);Thin liquid No new CVA seen on MRI brain 12/31 Denied CIR, SNF versus home health not allowed to drive until  they have been seizure-free for six months Family refused SNF and would like to take the patient home with home health  Vitamin B-12 deficiency  B12 was borderline at 228 in September 2017, neurology sent methylmalonic acid. Started on IM B12, if her methylmalonic acid comes back positive, then this will need to be continued weekly for 4 doses as well as monthly thereafter.  Acute respiratory failure, intubated for airway protection due to seizure activity Possible pneumonia, levofloxacin stopped 12/31 passed SBT & extubated 1/1    history of coronary ischemia s/p DES, see above Continue aspirin and Plavix  Hypertension Antihypertensive medications adjusted  Chronic kidney disease, stage III-baseline creatinine 1.3 Stable  Diabetes mellitus-continue sliding scale insulin. Most recent hemoglobin A1c 6.7 Metformin discontinued because of a creatinine of 1.4 Replace with glipizide 2.5 mg a day   Acute encephalopathy secondary to seizure-resolved   Discharge Exam:  Blood pressure (!) 150/73, pulse 92, temperature 98 F (36.7 C), temperature source Oral, resp. rate 20, height _0  (1.702 m), weight 84.7 kg (186 lb 11.2 oz), SpO2 100 %.  General exam: Appears calm and comfortable  Respiratory system: Clear to auscultation. Respiratory effort normal. Cardiovascular system: S1 & S2 heard, RRR. No JVD, murmurs, rubs,  gallops or clicks. No pedal edema. Gastrointestinal system: Abdomen is nondistended, soft and nontender. No organomegaly or masses felt. Normal bowel sounds heard. Central nervous system: Alert and oriented. No focal neurological deficits. Extremities: Symmetric 5 x 5 power. Skin: No rashes, lesions or ulcers Psychiatry: Judgement and insight appear normal. Mood & affect appropriate.      Follow-up Information    EHoyt Koch MD. Call.   Specialty:  Internal Medicine Why:  See primary care provider in 3-5 days, hospital follow-up  Contact information: [address]  [place] [phone]          Signed: AReyne Dumas1/10/2016, 10:23 AM        Time spent >45 mins

## 2016-06-25 NOTE — Progress Notes (Signed)
Physical Therapy Treatment Patient Details Name: Kayla [name] MRN: [id number] DOB: [birth year] Today's Date: 06/25/2016    History of Present Illness Patient is a 76 y/o female with hx of DM, CVA, sickle trait, HTN, fibromyalgia, CAD, blind left eye, anxiety presents with Acute encephalopathy and witnessed seizure.     PT Comments    Pt performed increased gait with decreased assist.  Pt remains to present with poor safety awareness and balance deficits.  Pt will continue to benefit from skilled therapy in post acute setting to address these deficits before returning home.   Follow Up Recommendations  SNF     Equipment Recommendations  None recommended by PT    Recommendations for Other Services       Precautions / Restrictions Precautions Precautions: Fall Restrictions Weight Bearing Restrictions: No    Mobility  Bed Mobility Overal bed mobility: Needs Assistance Bed Mobility: Supine to Sit;Sit to Supine     Supine to sit: Supervision;HOB elevated (use of rails.  )     General bed mobility comments: Increased time but able to advance to edge of bed unassisted with increased time and max VCs.    Transfers Overall transfer level: Needs assistance Equipment used: Rolling walker (2 wheeled) Transfers: Sit to/from Stand Sit to Stand: Min assist         General transfer comment: Cues for hand placement to and from seated surface.  Pt reaching for RW intially required cues for safety.    Ambulation/Gait Ambulation/Gait assistance: Min assist Ambulation Distance (Feet): 100 Feet Assistive device: Rolling walker (2 wheeled) Gait Pattern/deviations: Step-through pattern;Decreased stride length;Drifts right/left;Trunk flexed   Gait velocity interpretation: Below normal speed for age/gender General Gait Details: Remains to present with forward flexion and lean.  required tactile facilitation to improve upper trunk control.  Cues for sequencing and B foot clearance.   Shuffling observed as patient began to fatigue.  Cues for obstacle negotiation.     Stairs            Wheelchair Mobility    Modified Rankin (Stroke Patients Only)       Balance Overall balance assessment: Needs assistance   Sitting balance-Leahy Scale: Fair       Standing balance-Leahy Scale: Poor                      Cognition Arousal/Alertness: Awake/alert Behavior During Therapy: WFL for tasks assessed/performed Overall Cognitive Status: Difficult to assess Area of Impairment: Memory Orientation Level: Disoriented to;Situation   Memory: Decreased short-term memory Following Commands: Follows multi-step commands with increased time (due to [facility])     Problem Solving: Slow processing;Requires verbal cues;Requires tactile cues      Exercises      General Comments        Pertinent Vitals/Pain Pain Assessment: No/denies pain    Home Living                      Prior Function            PT Goals (current goals can now be found in the care plan section) Acute Rehab PT Goals Patient Stated Goal: to walk Potential to Achieve Goals: Fair Progress towards PT goals: Progressing toward goals    Frequency           PT Plan Current plan remains appropriate    Co-evaluation             End of Session Equipment Utilized During  Treatment: Gait belt Activity Tolerance: Patient tolerated treatment well Patient left: in chair;with call bell/phone within reach;with family/visitor present (no chair alarm present has older box in room.  Daughter in room and educated to inform staff if she is to leave room.  )     Time: [id number] PT Time Calculation (min) (ACUTE ONLY): 15 min  Charges:  $Gait Training: 8-22 mins                    G Codes:      Cristela Blue 2016/07/18, 10:20 AM  Governor Rooks, PTA pager [phone]

## 2016-06-25 NOTE — Care Management Note (Signed)
Case Management Note  Patient Details  Name: Kayla [name] MRN: [id number] Date of Birth: [birth year]  Subjective/Objective:                    Action/Plan: Pt discharged but unable to be placed in SNF. CM and CSW met with the patient and her daughter and went over the plan. Daughter agreed to take pt to her home with [facility] service. Daughter would like the possibility of her mother going to [place] on Tuesday to be left open. CSW aware. CM provide the daughter with a list of [place] agencies and she selected [place]. They were not able to accept the referral. Daughters second choice was [place]. CM spoke to [facility] with [place] and they accepted the referral. Pt is going to daughters home at [employer] in [place]. Information given to [facility]. Daughter asked for [facility] transportation home for her mother. CSW to arrange.   Expected Discharge Date:                  Expected Discharge Plan:  Skilled Nursing Facility  In-House Referral:  Clinical Social Work  Discharge planning Services  CM Consult  Post Acute Care Choice:  Home Health Choice offered to:  Adult Children  DME Arranged:    DME Agency:     HH Arranged:  RN, PT, OT, Nurse's Aide, Social Work [employer] Agency:  [place]  Status of Service:  Completed, signed off  If discussed at [employer], dates discussed:    Additional Comments:  Pollie Friar, RN 06/25/2016, 8:19 PM

## 2016-06-25 NOTE — Progress Notes (Signed)
Carelink Summary Report / Loop Recorder 

## 2016-06-26 DIAGNOSIS — E611 Iron deficiency: Secondary | ICD-10-CM | POA: Diagnosis not present

## 2016-06-26 DIAGNOSIS — E119 Type 2 diabetes mellitus without complications: Secondary | ICD-10-CM | POA: Diagnosis not present

## 2016-06-26 DIAGNOSIS — G4089 Other seizures: Secondary | ICD-10-CM | POA: Diagnosis not present

## 2016-06-26 DIAGNOSIS — R262 Difficulty in walking, not elsewhere classified: Secondary | ICD-10-CM | POA: Diagnosis not present

## 2016-06-26 DIAGNOSIS — I10 Essential (primary) hypertension: Secondary | ICD-10-CM | POA: Diagnosis not present

## 2016-06-28 DIAGNOSIS — E119 Type 2 diabetes mellitus without complications: Secondary | ICD-10-CM | POA: Diagnosis not present

## 2016-06-28 DIAGNOSIS — E611 Iron deficiency: Secondary | ICD-10-CM | POA: Diagnosis not present

## 2016-06-28 DIAGNOSIS — R262 Difficulty in walking, not elsewhere classified: Secondary | ICD-10-CM | POA: Diagnosis not present

## 2016-06-28 DIAGNOSIS — I10 Essential (primary) hypertension: Secondary | ICD-10-CM | POA: Diagnosis not present

## 2016-06-28 DIAGNOSIS — G4089 Other seizures: Secondary | ICD-10-CM | POA: Diagnosis not present

## 2016-06-29 ENCOUNTER — Ambulatory Visit (INDEPENDENT_AMBULATORY_CARE_PROVIDER_SITE_OTHER): Payer: Medicare HMO | Admitting: Internal Medicine

## 2016-06-29 ENCOUNTER — Other Ambulatory Visit (INDEPENDENT_AMBULATORY_CARE_PROVIDER_SITE_OTHER): Payer: Medicare HMO

## 2016-06-29 ENCOUNTER — Encounter: Payer: Self-pay | Admitting: Internal Medicine

## 2016-06-29 VITALS — BP 124/80 | HR 74 | Temp 98.2°F | Resp 12 | Ht 67.0 in | Wt 180.0 lb

## 2016-06-29 DIAGNOSIS — G4089 Other seizures: Secondary | ICD-10-CM | POA: Diagnosis not present

## 2016-06-29 DIAGNOSIS — I69398 Other sequelae of cerebral infarction: Secondary | ICD-10-CM

## 2016-06-29 DIAGNOSIS — R262 Difficulty in walking, not elsewhere classified: Secondary | ICD-10-CM | POA: Diagnosis not present

## 2016-06-29 DIAGNOSIS — K921 Melena: Secondary | ICD-10-CM

## 2016-06-29 DIAGNOSIS — R569 Unspecified convulsions: Secondary | ICD-10-CM

## 2016-06-29 DIAGNOSIS — Z7409 Other reduced mobility: Secondary | ICD-10-CM

## 2016-06-29 DIAGNOSIS — E611 Iron deficiency: Secondary | ICD-10-CM | POA: Diagnosis not present

## 2016-06-29 DIAGNOSIS — R531 Weakness: Secondary | ICD-10-CM

## 2016-06-29 DIAGNOSIS — I10 Essential (primary) hypertension: Secondary | ICD-10-CM | POA: Diagnosis not present

## 2016-06-29 DIAGNOSIS — E119 Type 2 diabetes mellitus without complications: Secondary | ICD-10-CM | POA: Diagnosis not present

## 2016-06-29 LAB — CBC
HCT: 39.5 % (ref 36.0–46.0)
Hemoglobin: 13.1 g/dL (ref 12.0–15.0)
MCHC: 33.2 g/dL (ref 30.0–36.0)
MCV: 84 fl (ref 78.0–100.0)
Platelets: 361 10*3/uL (ref 150.0–400.0)
RBC: 4.7 Mil/uL (ref 3.87–5.11)
RDW: 18.1 % — ABNORMAL HIGH (ref 11.5–15.5)
WBC: 5.3 10*3/uL (ref 4.0–10.5)

## 2016-06-29 LAB — COMPREHENSIVE METABOLIC PANEL
ALT: 16 U/L (ref 0–35)
AST: 11 U/L (ref 0–37)
Albumin: 3.8 g/dL (ref 3.5–5.2)
Alkaline Phosphatase: 69 U/L (ref 39–117)
BILIRUBIN TOTAL: 0.3 mg/dL (ref 0.2–1.2)
BUN: 26 mg/dL — ABNORMAL HIGH (ref 6–23)
CALCIUM: 9.4 mg/dL (ref 8.4–10.5)
CO2: 27 mEq/L (ref 19–32)
Chloride: 109 mEq/L (ref 96–112)
Creatinine, Ser: 1.53 mg/dL — ABNORMAL HIGH (ref 0.40–1.20)
GFR: 42.36 mL/min — AB (ref >60.00)
GLUCOSE: 126 mg/dL — AB (ref 70–99)
POTASSIUM: 4.8 meq/L (ref 3.5–5.1)
SODIUM: 143 meq/L (ref 135–145)
Total Protein: 7.2 g/dL (ref 6.0–8.3)

## 2016-06-29 MED ORDER — LEVETIRACETAM ER 500 MG PO TB24: 500.0000 mg | ORAL_TABLET | Freq: Every day | ORAL | 3 refills | Status: DC

## 2016-06-29 NOTE — Patient Instructions (Addendum)
It is okay to stop doing the B12 shots as you do not need them.   We will recheck the labs today and call you back about the results.  It is okay to take the hydralazine only 3 times per day.

## 2016-06-29 NOTE — Progress Notes (Signed)
Pre visit review using our clinic review tool, if applicable. No additional management support is needed unless otherwise documented below in the visit note. 

## 2016-06-30 ENCOUNTER — Telehealth: Payer: Self-pay | Admitting: Internal Medicine

## 2016-06-30 ENCOUNTER — Encounter: Payer: Self-pay | Admitting: Internal Medicine

## 2016-06-30 NOTE — Assessment & Plan Note (Signed)
Checking CBC and CMP today to ensure stability.

## 2016-06-30 NOTE — Telephone Encounter (Signed)
Requesting verbal ok for skilled PT visits for 2 times a week for 4 weeks.

## 2016-06-30 NOTE — Assessment & Plan Note (Signed)
We talked about the need for PT and OT to help regain her strength. She was intubated and sedated during this hospital stay which can take extended periods of time to recover from. We will optimize her medications to avoid sedation to help.

## 2016-06-30 NOTE — Progress Notes (Signed)
   Subjective:    Patient ID: Kayla [name], female    DOB: 11/25/1940, 76 y.o.   MRN: [id number]  HPI The patient is a 76 YO female coming in for hospital follow up (in for seizure induced by old stroke, ended up intubated for some time, then started on seizure medication and no more seizures, BP meds adjusted in the hospital). She is doing okay since being home from the hospital. She is more tired since her medicines are adjusted. They are not able to get her hydralazine 4 times per day. She is taking it 3 times per day only. BP is doing well. She is also taking the keppra twice a day as prescribed. No more seizure activity. She does not drive. No falls at home. No new chest pains, SOB, abdominal pain, constipation, diarrhea. They are doing PT at the home which is helping some.   PMH, [facility], social history reviewed and updated.   Review of Systems  Constitutional: Positive for activity change, appetite change and fatigue. Negative for chills, diaphoresis, fever and unexpected weight change.  HENT: Negative.   Eyes: Negative.   Respiratory: Negative.   Cardiovascular: Negative.   Gastrointestinal: Negative.   Musculoskeletal: Positive for gait problem and myalgias. Negative for arthralgias and back pain.  Skin: Negative.   Neurological: Positive for weakness and numbness. Negative for dizziness, light-headedness and headaches.  Psychiatric/Behavioral: Positive for decreased concentration. Negative for behavioral problems, dysphoric mood, hallucinations, sleep disturbance and suicidal ideas.      Objective:   Physical Exam  Constitutional: She appears well-developed and well-nourished.  HENT:  Head: Normocephalic and atraumatic.  Eyes: EOM are normal.  Neck: Normal range of motion.  Cardiovascular: Normal rate and regular rhythm.   Pulmonary/Chest: Effort normal. No respiratory distress. She has no wheezes. She has no rales.  Abdominal: Soft. Bowel sounds are normal. She exhibits no  distension. There is no tenderness. There is no rebound.  Musculoskeletal: She exhibits no edema.  Neurological: She is alert. Coordination abnormal.  Speech is slightly more poor than usual but she is able to interact and follow 2 step directions. Walking is stable but slow and worse than usual as well.   Skin: Skin is warm and dry.   Vitals:   06/29/16 1401  BP: 124/80  Pulse: 74  Resp: 12  Temp: 98.2 F (36.8 C)  TempSrc: Oral  SpO2: 99%  Weight: 180 lb (81.6 kg)  Height: 5\' 7"  (1.702 m)      Assessment & Plan:

## 2016-06-30 NOTE — Assessment & Plan Note (Signed)
Change keppra to XR form 500 mg daily to help with fatigue which could be related. Will also refer to neurology for follow up since no follow up scheduled from the hospital. No episodes since the hospital.

## 2016-07-01 ENCOUNTER — Other Ambulatory Visit: Payer: Self-pay | Admitting: Internal Medicine

## 2016-07-01 DIAGNOSIS — G4089 Other seizures: Secondary | ICD-10-CM | POA: Diagnosis not present

## 2016-07-01 DIAGNOSIS — I10 Essential (primary) hypertension: Secondary | ICD-10-CM | POA: Diagnosis not present

## 2016-07-01 DIAGNOSIS — E119 Type 2 diabetes mellitus without complications: Secondary | ICD-10-CM | POA: Diagnosis not present

## 2016-07-01 DIAGNOSIS — R262 Difficulty in walking, not elsewhere classified: Secondary | ICD-10-CM | POA: Diagnosis not present

## 2016-07-01 DIAGNOSIS — E611 Iron deficiency: Secondary | ICD-10-CM | POA: Diagnosis not present

## 2016-07-01 NOTE — Telephone Encounter (Signed)
Called Dennis at [facility] and gave verbal ok for pt 2 times a week for 4 weeks

## 2016-07-01 NOTE — Telephone Encounter (Signed)
Ok to give

## 2016-07-01 NOTE — Telephone Encounter (Signed)
Medication is not on med list, called pharmacy and they said she picked up some back in December, not sure on what to do.

## 2016-07-02 ENCOUNTER — Ambulatory Visit: Payer: Medicare HMO | Admitting: Rehabilitation

## 2016-07-02 DIAGNOSIS — I10 Essential (primary) hypertension: Secondary | ICD-10-CM | POA: Diagnosis not present

## 2016-07-02 DIAGNOSIS — G4089 Other seizures: Secondary | ICD-10-CM | POA: Diagnosis not present

## 2016-07-02 DIAGNOSIS — E611 Iron deficiency: Secondary | ICD-10-CM | POA: Diagnosis not present

## 2016-07-02 DIAGNOSIS — R262 Difficulty in walking, not elsewhere classified: Secondary | ICD-10-CM | POA: Diagnosis not present

## 2016-07-02 DIAGNOSIS — E119 Type 2 diabetes mellitus without complications: Secondary | ICD-10-CM | POA: Diagnosis not present

## 2016-07-06 DIAGNOSIS — E611 Iron deficiency: Secondary | ICD-10-CM | POA: Diagnosis not present

## 2016-07-06 DIAGNOSIS — G4089 Other seizures: Secondary | ICD-10-CM | POA: Diagnosis not present

## 2016-07-06 DIAGNOSIS — E119 Type 2 diabetes mellitus without complications: Secondary | ICD-10-CM | POA: Diagnosis not present

## 2016-07-06 DIAGNOSIS — I10 Essential (primary) hypertension: Secondary | ICD-10-CM | POA: Diagnosis not present

## 2016-07-06 DIAGNOSIS — R262 Difficulty in walking, not elsewhere classified: Secondary | ICD-10-CM | POA: Diagnosis not present

## 2016-07-09 ENCOUNTER — Ambulatory Visit: Payer: Medicare HMO | Admitting: Physical Therapy

## 2016-07-09 DIAGNOSIS — G4089 Other seizures: Secondary | ICD-10-CM | POA: Diagnosis not present

## 2016-07-09 DIAGNOSIS — E611 Iron deficiency: Secondary | ICD-10-CM | POA: Diagnosis not present

## 2016-07-09 DIAGNOSIS — I10 Essential (primary) hypertension: Secondary | ICD-10-CM | POA: Diagnosis not present

## 2016-07-09 DIAGNOSIS — E119 Type 2 diabetes mellitus without complications: Secondary | ICD-10-CM | POA: Diagnosis not present

## 2016-07-09 DIAGNOSIS — R262 Difficulty in walking, not elsewhere classified: Secondary | ICD-10-CM | POA: Diagnosis not present

## 2016-07-10 DIAGNOSIS — R262 Difficulty in walking, not elsewhere classified: Secondary | ICD-10-CM | POA: Diagnosis not present

## 2016-07-10 DIAGNOSIS — G4089 Other seizures: Secondary | ICD-10-CM | POA: Diagnosis not present

## 2016-07-10 DIAGNOSIS — I10 Essential (primary) hypertension: Secondary | ICD-10-CM | POA: Diagnosis not present

## 2016-07-10 DIAGNOSIS — E119 Type 2 diabetes mellitus without complications: Secondary | ICD-10-CM | POA: Diagnosis not present

## 2016-07-10 DIAGNOSIS — E611 Iron deficiency: Secondary | ICD-10-CM | POA: Diagnosis not present

## 2016-07-12 DIAGNOSIS — I10 Essential (primary) hypertension: Secondary | ICD-10-CM | POA: Diagnosis not present

## 2016-07-12 DIAGNOSIS — R262 Difficulty in walking, not elsewhere classified: Secondary | ICD-10-CM | POA: Diagnosis not present

## 2016-07-12 DIAGNOSIS — E119 Type 2 diabetes mellitus without complications: Secondary | ICD-10-CM | POA: Diagnosis not present

## 2016-07-12 DIAGNOSIS — G4089 Other seizures: Secondary | ICD-10-CM | POA: Diagnosis not present

## 2016-07-12 DIAGNOSIS — E611 Iron deficiency: Secondary | ICD-10-CM | POA: Diagnosis not present

## 2016-07-12 LAB — CUP PACEART REMOTE DEVICE CHECK
MDC IDC PG IMPLANT DT: 20160315
MDC IDC SESS DTM: 20171205194058

## 2016-07-12 NOTE — Progress Notes (Signed)
Carelink summary report received. Battery status OK. Normal device function. No new symptom episodes, tachy episodes, brady, or pause episodes. No new AF episodes. Monthly summary reports and ROV/PRN 

## 2016-07-13 DIAGNOSIS — I10 Essential (primary) hypertension: Secondary | ICD-10-CM | POA: Diagnosis not present

## 2016-07-13 DIAGNOSIS — E119 Type 2 diabetes mellitus without complications: Secondary | ICD-10-CM | POA: Diagnosis not present

## 2016-07-13 DIAGNOSIS — R262 Difficulty in walking, not elsewhere classified: Secondary | ICD-10-CM | POA: Diagnosis not present

## 2016-07-13 DIAGNOSIS — E611 Iron deficiency: Secondary | ICD-10-CM | POA: Diagnosis not present

## 2016-07-13 DIAGNOSIS — G4089 Other seizures: Secondary | ICD-10-CM | POA: Diagnosis not present

## 2016-07-14 DIAGNOSIS — I10 Essential (primary) hypertension: Secondary | ICD-10-CM | POA: Diagnosis not present

## 2016-07-14 DIAGNOSIS — R262 Difficulty in walking, not elsewhere classified: Secondary | ICD-10-CM | POA: Diagnosis not present

## 2016-07-14 DIAGNOSIS — R269 Unspecified abnormalities of gait and mobility: Secondary | ICD-10-CM

## 2016-07-14 DIAGNOSIS — M6281 Muscle weakness (generalized): Secondary | ICD-10-CM

## 2016-07-14 DIAGNOSIS — G0489 Other myelitis: Secondary | ICD-10-CM | POA: Diagnosis not present

## 2016-07-14 DIAGNOSIS — G4089 Other seizures: Secondary | ICD-10-CM | POA: Diagnosis not present

## 2016-07-14 DIAGNOSIS — E119 Type 2 diabetes mellitus without complications: Secondary | ICD-10-CM | POA: Diagnosis not present

## 2016-07-14 DIAGNOSIS — E611 Iron deficiency: Secondary | ICD-10-CM | POA: Diagnosis not present

## 2016-07-15 DIAGNOSIS — E611 Iron deficiency: Secondary | ICD-10-CM | POA: Diagnosis not present

## 2016-07-15 DIAGNOSIS — E119 Type 2 diabetes mellitus without complications: Secondary | ICD-10-CM | POA: Diagnosis not present

## 2016-07-15 DIAGNOSIS — G4089 Other seizures: Secondary | ICD-10-CM | POA: Diagnosis not present

## 2016-07-15 DIAGNOSIS — R262 Difficulty in walking, not elsewhere classified: Secondary | ICD-10-CM | POA: Diagnosis not present

## 2016-07-15 DIAGNOSIS — I10 Essential (primary) hypertension: Secondary | ICD-10-CM | POA: Diagnosis not present

## 2016-07-19 DIAGNOSIS — E611 Iron deficiency: Secondary | ICD-10-CM | POA: Diagnosis not present

## 2016-07-19 DIAGNOSIS — I10 Essential (primary) hypertension: Secondary | ICD-10-CM | POA: Diagnosis not present

## 2016-07-19 DIAGNOSIS — E119 Type 2 diabetes mellitus without complications: Secondary | ICD-10-CM | POA: Diagnosis not present

## 2016-07-19 DIAGNOSIS — R262 Difficulty in walking, not elsewhere classified: Secondary | ICD-10-CM | POA: Diagnosis not present

## 2016-07-19 DIAGNOSIS — G4089 Other seizures: Secondary | ICD-10-CM | POA: Diagnosis not present

## 2016-07-20 DIAGNOSIS — E119 Type 2 diabetes mellitus without complications: Secondary | ICD-10-CM | POA: Diagnosis not present

## 2016-07-20 DIAGNOSIS — G4089 Other seizures: Secondary | ICD-10-CM | POA: Diagnosis not present

## 2016-07-20 DIAGNOSIS — E611 Iron deficiency: Secondary | ICD-10-CM | POA: Diagnosis not present

## 2016-07-20 DIAGNOSIS — R262 Difficulty in walking, not elsewhere classified: Secondary | ICD-10-CM | POA: Diagnosis not present

## 2016-07-20 DIAGNOSIS — I10 Essential (primary) hypertension: Secondary | ICD-10-CM | POA: Diagnosis not present

## 2016-07-21 DIAGNOSIS — E611 Iron deficiency: Secondary | ICD-10-CM | POA: Diagnosis not present

## 2016-07-21 DIAGNOSIS — E119 Type 2 diabetes mellitus without complications: Secondary | ICD-10-CM | POA: Diagnosis not present

## 2016-07-21 DIAGNOSIS — G4089 Other seizures: Secondary | ICD-10-CM | POA: Diagnosis not present

## 2016-07-21 DIAGNOSIS — I10 Essential (primary) hypertension: Secondary | ICD-10-CM | POA: Diagnosis not present

## 2016-07-21 DIAGNOSIS — R262 Difficulty in walking, not elsewhere classified: Secondary | ICD-10-CM | POA: Diagnosis not present

## 2016-07-26 ENCOUNTER — Ambulatory Visit (INDEPENDENT_AMBULATORY_CARE_PROVIDER_SITE_OTHER): Payer: Medicare HMO | Admitting: *Deleted

## 2016-07-26 DIAGNOSIS — I6389 Other cerebral infarction: Secondary | ICD-10-CM

## 2016-07-26 DIAGNOSIS — I638 Other cerebral infarction: Secondary | ICD-10-CM

## 2016-07-27 NOTE — Progress Notes (Signed)
Carelink Summary Report / Loop Recorder 

## 2016-08-05 ENCOUNTER — Encounter: Payer: Self-pay | Admitting: Neurology

## 2016-08-05 ENCOUNTER — Ambulatory Visit (INDEPENDENT_AMBULATORY_CARE_PROVIDER_SITE_OTHER): Payer: Medicare HMO | Admitting: Neurology

## 2016-08-05 VITALS — BP 150/90 | HR 71

## 2016-08-05 DIAGNOSIS — G40009 Localization-related (focal) (partial) idiopathic epilepsy and epileptic syndromes with seizures of localized onset, not intractable, without status epilepticus: Secondary | ICD-10-CM

## 2016-08-05 NOTE — Patient Instructions (Signed)
I had a long discussion with the patient and her daughter regarding her new onset partial complex seizures which likely represent late effect of her previous embolic strokes. I recommend she continue Keppra XR 500 mg daily for seizure prophylaxis. Check EEG. Continue Plavix for stroke prevention but discontinue aspirin as a do not see any long-term benefit from dual antiplatelet therapy for stroke prevention. Maintain strict control of hypertension with blood pressure goal below 130/90, lipids with LDL cholesterol goal below 70 mg percent and diabetes with hemoglobin A1c goal below 6.5%. I encouraged patient to use a walker at all times and also fall and safety precautions were discussed. She was also advised not to drive for 6 months since her seizure as per [facility] . We also talked about seizure provoking stimuli and need to avoid them She will return for follow-up in 6 months with my nurse practitioner or call earlier if necessary.   Epilepsy Epilepsy is when a person keeps having seizures. A seizure is unusual activity in the brain. A seizure can change how you think or behave, and it can make it hard to be aware of what is happening. This condition can cause problems, such as:  Falls, accidents, and injury.  Depression.  Poor memory.  Sudden unexplained death in epilepsy (SUDEP). This is rare. Its cause is not known. Most people with epilepsy lead normal lives. Follow these instructions at home: Medicines  Take medicines only as told by your doctor.  Avoid anything that may keep your medicine from working, such as alcohol. Activity  Get enough rest. Lack of sleep can make seizures more likely to occur.  Follow your doctor's advice about driving, swimming, and doing anything else that would be dangerous if you had a seizure. Teaching others  Teach friends and family what to do if you have a seizure. They should:  Lay you on the ground to prevent a fall.  Cushion your  head and body.  Loosen any tight clothing around your neck.  Turn you on your side.  Stay with you until you are better.  Not hold you down.  Not put anything in your mouth.  Know whether or not you need emergency care. General instructions  Avoid anything that causes you to have seizures.  Keep a seizure diary. Write down what you remember about each seizure, and especially what might have caused it.  Keep all follow-up visits as told by your doctor. This is important. Contact a doctor if:  You have a change in your seizure pattern.  You get an infection or start to feel sick. You may have more seizures when you are sick. Get help right away if:  A seizure does not stop after 5 minutes.  You have more than one seizure in a row, and you do not have enough time between the seizures to feel better.  A seizure makes it harder to breathe.  A seizure is different from other seizures you have had.  A seizure makes you unable to speak or use a part of your body.  You did not wake up right after a seizure. This information is not intended to replace advice given to you by your health care provider. Make sure you discuss any questions you have with your health care provider. Document Released: 04/04/2009 Document Revised: 01/12/2016 Document Reviewed: 12/16/2015 Elsevier Interactive Patient Education  2017 [employer].

## 2016-08-05 NOTE — Progress Notes (Signed)
[facility] [address] [place]. [place] [address] [phone]       OFFICE CONSULT NOTE  Kayla. Demetriss Ko Simonet Date of Birth:  Apr 05, 1941 Medical Record Number:  [id number]   Referring MD:  Dr Leonel Ramsay  Reason for Referral:  Seizure HPI: Kayla [name] is a pleasant [age] Caucasian lady who is seen today for first office visit following admission for possible seizure on 06/19/16. Patient wasn't home and witnessed by her daughter she had head deviation to the left and she was slow to respond to questions. She did not have any significant weakness tonic-clonic jerking activity are tongue bite or drooling. However after she was brought to the hospital because she continued to be slow to respond to questions there was another episode in the ER where she had clearly head deviation to the left, stopped talking and body became tense and she went on into a generalized seizure. She was given Ativan 2 mg and subsequently intubated for airway protection. She was started on propofol drip as well. No further seizure activity is noted. CT scan showed old right occipital and posterior temporal and frontal infarct but no acute findings. EEG did not show any ongoing seizure activity. MRI scan of the brain was up 10 personally reviewed by me shows old infarcts but no acute abnormality. Patient was started on Keppra for seizures. She's done well since discharge. She is living at home with her husband. She is walking with a walker. She has had no falls. She was initially quite sleepy on Keppra 500 mg twice daily but after she was switched to ask our long-acting Keppra she is tolerating it much better. She remains on aspirin and Plavix but does complain of excessive bruising. She has prior history of cryptogenic strokes in February 2016. She developed right occipital and temporal infarcts on 2/29 for 16 secondary to P2 segment occlusion and high-grade right M2 stenosis. Transthoracic echo and  transesophageal echo was unremarkable. Carotid ultrasound showed moderate fixed recurrent stenosis. She has a loop recorder implanted but so far it with ablation has not been found. She was started on Lipitor as well as aspirin and Plavix and tolerating it well except for bruising. She is still has residual left-sided visual field loss as well as mild left hemiparesis. She walks with dragging of the left foot.  ROS:   14 system review of systems is positive for  hearing loss, runny nose, loss of vision, memory loss, numbness, confusion, rash, moles and all other systems negative PMH:  Past Medical History:  Diagnosis Date  . Allergy    Shell fish  . Anxiety   . Arthritis    "knees, back, probably left hand" (01/14/2015)  . Asthmatic bronchitis   . Blind left eye   . Constipation   . Coronary artery disease   . Depression   . Diverticulosis of colon   . DJD (degenerative joint disease)   . Fibromyalgia   . GERD (gastroesophageal reflux disease)   . Heart murmur   . History of gout   . Hypercholesteremia   . Hypertension   . Personal history of noncompliance with medical treatment, presenting hazards to health   . Prolapse of vaginal vault after hysterectomy   . Proteinuria   . Sickle-cell trait ([place])   . Somatic dysfunction   . Stroke [facility]) ~ 08/2014   "blind in left eye; weak on left side since" (01/14/2015)  . Type II diabetes mellitus ([place])   . Venous insufficiency  Social History:  Social History   Social History  . Marital status: Married    Spouse name: N/A  . Number of children: 4  . Years of education: 24   Occupational History  . [place]     retired   Social History Main Topics  . Smoking status: Never Smoker  . Smokeless tobacco: Never Used  . Alcohol use 0.0 oz/week     Comment: 01/14/2015 "might have a glass of wine a few times/yr"  . Drug use: No  . Sexual activity: No   Other Topics Concern  . Not on file   Social History Narrative   Married    Right handed   Caffeine use - 1 cup coffee daily    Medications:   Current Outpatient Prescriptions on File Prior to Visit  Medication Sig Dispense Refill  . atorvastatin (LIPITOR) 20 MG tablet Take 1 tablet (20 mg total) by mouth daily at 6 PM. 30 tablet 3  . cetirizine (ZYRTEC) 10 MG tablet Take 10 mg by mouth daily.    . clopidogrel (PLAVIX) 75 MG tablet Take 75 mg by mouth daily. Reported on 06/06/2015    . glipiZIDE (GLUCOTROL XL) 2.5 MG 24 hr tablet Take 1 tablet (2.5 mg total) by mouth daily with breakfast. 30 tablet 2  . glucose blood (ACCU-CHEK AVIVA) test strip Check blood sugar once daily 100 each 4  . hydrALAZINE (APRESOLINE) 50 MG tablet Take 1 tablet (50 mg total) by mouth 4 (four) times daily. 120 tablet 3  . isosorbide mononitrate (IMDUR) 60 MG 24 hr tablet Take 60 mg by mouth daily.     [name] levETIRAcetam (KEPPRA XR) 500 MG 24 hr tablet Take 1 tablet (500 mg total) by mouth daily. 30 tablet 3  . lisinopril (PRINIVIL,ZESTRIL) 20 MG tablet Take 20 mg by mouth daily.    . nitroGLYCERIN (NITROSTAT) 0.4 MG SL tablet Place 0.4 mg under the tongue every 5 (five) minutes as needed for chest pain.    . polyethylene glycol (MIRALAX / GLYCOLAX) packet Take 17 g by mouth daily as needed for moderate constipation.     No current facility-administered medications on file prior to visit.     Allergies:   Allergies  Allergen Reactions  . Amlodipine Shortness Of Breath and Rash  . Fish Allergy Hives  . Penicillins Other (See Comments)    REACTION: red rash  . Shellfish Allergy Hives  . Peanuts [Peanut Oil] Rash  . Tomato Rash    Physical Exam General: well developed, well nourished pleasant elderly Caucasian lady, seated, in no evident distress Head: head normocephalic and atraumatic.   Neck: supple with no carotid or supraclavicular bruits Cardiovascular: regular rate and rhythm, no murmurs Musculoskeletal: no deformity Skin:  no rash/petichiae Vascular:  Normal pulses all  extremities  Neurologic Exam Mental Status: Awake and fully alert. Oriented to place and time. Recent and remote memory intact. Attention span, concentration and fund of knowledge appropriate. Mood and affect appropriate.  Cranial Nerves: Fundoscopic exam reveals sharp disc margins. Pupils equal, briskly reactive to light. Extraocular movements full without nystagmus. Visual fields show partial left hemianopsia   to confrontation. Hearing intact. Facial sensation intact. Mild left lower face weakness., Tongue, palate moves normally and symmetrically.  Motor: Normal bulk and tone. Normal strength in all tested extremity muscles except mild left grip weakness and diminished fine finger movements on the left. Orbits right over left approximately.. Tone slightly increased in the left leg with mild foot drop and  ankle dorsiflexor weakness.. Mild left hand tremor Sensory.: intact to touch ,pinprick .position and vibratory sensation.  Coordination: Rapid alternating movements normal in all extremities. Finger-to-nose slightly impaired on the left and heel-to-shin performed accurately bilaterally. Gait and Station: Arises from chair without difficulty. Stance is normal. Gait demonstrates normal stride length and balance  but slight dragging of the left leg. Unable to heel, toe and tandem walk  . Uses walker..  Reflexes: 1+ and symmetric. Toes downgoing.   NIHSS  4 Modified Rankin 2    ASSESSMENT: 89 year lady with 2 episodes of possible complex partial seizures likely symptomatic from prior strokes. History of embolic right posterior cerebral artery and middle cerebral artery infarcts and debris 2016 of cryptogenic etiology. Vascular risk factors of diabetes, hypertension and hyperlipidemia.    PLAN: I had a long discussion with the patient and her daughter regarding her new onset partial complex seizures which likely represent late effect of her previous embolic strokes. I recommend she continue  Keppra XR 500 mg daily for seizure prophylaxis. Check EEG. Continue Plavix for stroke prevention but discontinue aspirin as a do not see any long-term benefit from dual antiplatelet therapy for stroke prevention. Maintain strict control of hypertension with blood pressure goal below 130/90, lipids with LDL cholesterol goal below 70 mg percent and diabetes with hemoglobin A1c goal below 6.5%. I encouraged patient to use a walker at all times and also fall and safety precautions were discussed. She was also advised not to drive for 6 months since her seizure as per [facility] . We also talked about seizure provoking stimuli and need to avoid them .greater than 50% and in this 45 minute consultation visit was spent on counseling and coordination of care about seizures, strokes, expression of diagnosis, treatment plan and answering questions She will return for follow-up in 6 months with my nurse practitioner or call earlier if necessary. Antony Contras, MD  [facility] [address] [place], [place] [address]  Phone [phone] Fax [phone] Note: This document was prepared with digital dictation and possible smart phrase technology. Any transcriptional errors that result from this process are unintentional.

## 2016-08-07 LAB — CUP PACEART REMOTE DEVICE CHECK
Date Time Interrogation Session: 20180104203752
Implantable Pulse Generator Implant Date: 20160315

## 2016-08-07 NOTE — Progress Notes (Signed)
Carelink summary report received. Battery status OK. Normal device function. No new symptom episodes, tachy episodes, brady, or pause episodes. No new AF episodes. Monthly summary reports and ROV/PRN 

## 2016-08-18 ENCOUNTER — Ambulatory Visit (INDEPENDENT_AMBULATORY_CARE_PROVIDER_SITE_OTHER): Payer: Medicare HMO | Admitting: Neurology

## 2016-08-18 DIAGNOSIS — G40009 Localization-related (focal) (partial) idiopathic epilepsy and epileptic syndromes with seizures of localized onset, not intractable, without status epilepticus: Secondary | ICD-10-CM | POA: Diagnosis not present

## 2016-08-20 ENCOUNTER — Telehealth: Payer: Self-pay | Admitting: Neurology

## 2016-08-20 NOTE — Telephone Encounter (Signed)
[name] request EEG results. Daughter was advised it can take 4-5 days for results to be read.

## 2016-08-21 LAB — CUP PACEART REMOTE DEVICE CHECK
Implantable Pulse Generator Implant Date: 20160315
MDC IDC SESS DTM: 20180203203829

## 2016-08-21 NOTE — Progress Notes (Signed)
Carelink summary report received. Battery status OK. Normal device function. No new symptom episodes, tachy episodes, brady, or pause episodes. No new AF episodes. Monthly summary reports and ROV/PRN 

## 2016-08-23 ENCOUNTER — Ambulatory Visit (INDEPENDENT_AMBULATORY_CARE_PROVIDER_SITE_OTHER): Payer: Medicare HMO | Admitting: *Deleted

## 2016-08-23 DIAGNOSIS — I6389 Other cerebral infarction: Secondary | ICD-10-CM

## 2016-08-23 DIAGNOSIS — I638 Other cerebral infarction: Secondary | ICD-10-CM

## 2016-08-24 NOTE — Progress Notes (Signed)
Carelink Summary Report / Loop Recorder 

## 2016-08-24 NOTE — Telephone Encounter (Signed)
I called left a message on daughter's answering machine stating EEG study was normal and call us back if she had any questions  t

## 2016-08-25 NOTE — Telephone Encounter (Signed)
Rn call patients daughter on DPR that her moms EEG was normal. Pts daughter verbalized understanding.

## 2016-09-22 ENCOUNTER — Ambulatory Visit (INDEPENDENT_AMBULATORY_CARE_PROVIDER_SITE_OTHER): Payer: Medicare HMO | Admitting: *Deleted

## 2016-09-22 DIAGNOSIS — I6389 Other cerebral infarction: Secondary | ICD-10-CM

## 2016-09-22 DIAGNOSIS — I638 Other cerebral infarction: Secondary | ICD-10-CM

## 2016-09-23 NOTE — Progress Notes (Signed)
Carelink Summary Report / Loop Recorder 

## 2016-09-24 LAB — CUP PACEART REMOTE DEVICE CHECK
Date Time Interrogation Session: 20180305204157
Implantable Pulse Generator Implant Date: 20160315

## 2016-09-26 ENCOUNTER — Other Ambulatory Visit: Payer: Self-pay | Admitting: Internal Medicine

## 2016-09-29 DIAGNOSIS — K08409 Partial loss of teeth, unspecified cause, unspecified class: Secondary | ICD-10-CM | POA: Diagnosis not present

## 2016-09-29 DIAGNOSIS — E7800 Pure hypercholesterolemia, unspecified: Secondary | ICD-10-CM | POA: Diagnosis not present

## 2016-09-29 DIAGNOSIS — E119 Type 2 diabetes mellitus without complications: Secondary | ICD-10-CM | POA: Diagnosis not present

## 2016-09-29 DIAGNOSIS — G8190 Hemiplegia, unspecified affecting unspecified side: Secondary | ICD-10-CM | POA: Diagnosis not present

## 2016-09-29 DIAGNOSIS — Z974 Presence of external hearing-aid: Secondary | ICD-10-CM | POA: Diagnosis not present

## 2016-09-29 DIAGNOSIS — D573 Sickle-cell trait: Secondary | ICD-10-CM | POA: Diagnosis not present

## 2016-09-29 DIAGNOSIS — I471 Supraventricular tachycardia: Secondary | ICD-10-CM | POA: Diagnosis not present

## 2016-09-29 DIAGNOSIS — E669 Obesity, unspecified: Secondary | ICD-10-CM | POA: Diagnosis not present

## 2016-09-29 DIAGNOSIS — Z0000 Encounter for general adult medical examination without abnormal findings: Secondary | ICD-10-CM | POA: Diagnosis not present

## 2016-09-29 DIAGNOSIS — I739 Peripheral vascular disease, unspecified: Secondary | ICD-10-CM | POA: Diagnosis not present

## 2016-09-29 DIAGNOSIS — I10 Essential (primary) hypertension: Secondary | ICD-10-CM | POA: Diagnosis not present

## 2016-09-29 DIAGNOSIS — Z972 Presence of dental prosthetic device (complete) (partial): Secondary | ICD-10-CM | POA: Diagnosis not present

## 2016-10-01 LAB — CUP PACEART REMOTE DEVICE CHECK
Implantable Pulse Generator Implant Date: 20160315
MDC IDC SESS DTM: 20180404204130

## 2016-10-17 ENCOUNTER — Other Ambulatory Visit: Payer: Self-pay | Admitting: Internal Medicine

## 2016-10-21 ENCOUNTER — Other Ambulatory Visit: Payer: Self-pay | Admitting: Internal Medicine

## 2016-10-21 ENCOUNTER — Telehealth: Payer: Self-pay | Admitting: Internal Medicine

## 2016-10-21 NOTE — Telephone Encounter (Signed)
Pt states on 4/9 [facility] did not receive glipi[facility]IDE (GLUCOTROL XL) 2.5 MG 24 hr tablet [[id number]]    Please resend to [facility] on [address].

## 2016-10-21 NOTE — Telephone Encounter (Signed)
Please advise, patient states pharmacy did not get refill, also its saying something about supply exceeds duration, dont want to mess up

## 2016-10-22 ENCOUNTER — Ambulatory Visit (INDEPENDENT_AMBULATORY_CARE_PROVIDER_SITE_OTHER): Payer: Medicare HMO | Admitting: *Deleted

## 2016-10-22 DIAGNOSIS — I638 Other cerebral infarction: Secondary | ICD-10-CM | POA: Diagnosis not present

## 2016-10-22 DIAGNOSIS — I6389 Other cerebral infarction: Secondary | ICD-10-CM

## 2016-10-25 NOTE — Progress Notes (Signed)
Carelink Summary Report / Loop Recorder 

## 2016-11-04 LAB — CUP PACEART REMOTE DEVICE CHECK
Implantable Pulse Generator Implant Date: 20160315
MDC IDC SESS DTM: 20180504213906

## 2016-11-22 ENCOUNTER — Ambulatory Visit (INDEPENDENT_AMBULATORY_CARE_PROVIDER_SITE_OTHER): Payer: Medicare HMO | Admitting: *Deleted

## 2016-11-22 DIAGNOSIS — I6389 Other cerebral infarction: Secondary | ICD-10-CM

## 2016-11-22 DIAGNOSIS — I638 Other cerebral infarction: Secondary | ICD-10-CM

## 2016-11-22 NOTE — Progress Notes (Signed)
Carelink Summary Report / Loop Recorder 

## 2016-11-24 LAB — CUP PACEART REMOTE DEVICE CHECK
Implantable Pulse Generator Implant Date: 20160315
MDC IDC SESS DTM: 20180603214229

## 2016-11-30 ENCOUNTER — Telehealth: Payer: Self-pay | Admitting: Nurse Practitioner

## 2016-11-30 ENCOUNTER — Ambulatory Visit (INDEPENDENT_AMBULATORY_CARE_PROVIDER_SITE_OTHER): Payer: Medicare HMO | Admitting: Nurse Practitioner

## 2016-11-30 ENCOUNTER — Ambulatory Visit: Payer: Medicare HMO | Admitting: Internal Medicine

## 2016-11-30 ENCOUNTER — Encounter: Payer: Self-pay | Admitting: Nurse Practitioner

## 2016-11-30 VITALS — BP 154/88 | HR 71 | Temp 97.6°F | Ht 67.0 in | Wt 189.0 lb

## 2016-11-30 DIAGNOSIS — Z7409 Other reduced mobility: Secondary | ICD-10-CM | POA: Diagnosis not present

## 2016-11-30 DIAGNOSIS — E1140 Type 2 diabetes mellitus with diabetic neuropathy, unspecified: Secondary | ICD-10-CM

## 2016-11-30 DIAGNOSIS — I10 Essential (primary) hypertension: Secondary | ICD-10-CM | POA: Diagnosis not present

## 2016-11-30 DIAGNOSIS — I69954 Hemiplegia and hemiparesis following unspecified cerebrovascular disease affecting left non-dominant side: Secondary | ICD-10-CM

## 2016-11-30 DIAGNOSIS — R531 Weakness: Secondary | ICD-10-CM

## 2016-11-30 MED ORDER — LISINOPRIL 20 MG PO TABS: 20.0000 mg | ORAL_TABLET | Freq: Every day | ORAL | 0 refills | Status: DC

## 2016-11-30 NOTE — Telephone Encounter (Signed)
Pt called stated she had an appt this morning but could not remember what she needed a refill of, and she needs a refill of lisinopril (PRINIVIL,ZESTRIL) 20 MG tablet   [facility] [address]

## 2016-11-30 NOTE — Progress Notes (Signed)
Subjective:  Patient ID: Kayla [name], female    DOB: [birth year]  Age: 76 y.o. MRN: [id number]  CC: Follow-up (BP medications follow up. )   HPI Ms. Puthoff is in office with daughter. She live with husband. Son and daughter help with ADLs and medication.  HTN: Does not check BP at home. Does not take all medications as prescribed.  DM: Does not check glucose at home. Takes glipizide as prescribed.  CVA with left hemiparesis: Fall last month while trying to go up stairs at home. Ambulates with cane. Unable to resume home PT due to lack of insurance coverage. No exercise at this time.  Outpatient Medications Prior to Visit  Medication Sig Dispense Refill  . atenolol (TENORMIN) 25 MG tablet Take 1 tablet by mouth 2 (two) times daily after a meal.    . atorvastatin (LIPITOR) 20 MG tablet Take 1 tablet (20 mg total) by mouth daily at 6 PM. 30 tablet 3  . cetirizine (ZYRTEC) 10 MG tablet Take 10 mg by mouth daily.    . clopidogrel (PLAVIX) 75 MG tablet TAKE 1 TABLET BY MOUTH EVERY DAY 30 tablet 3  . glipiZIDE (GLUCOTROL XL) 2.5 MG 24 hr tablet Take 1 tablet (2.5 mg total) by mouth daily with breakfast. 90 tablet 1  . glucose blood (ACCU-CHEK AVIVA) test strip Check blood sugar once daily 100 each 4  . isosorbide mononitrate (IMDUR) 60 MG 24 hr tablet Take 60 mg by mouth daily.     [name] levETIRAcetam (KEPPRA XR) 500 MG 24 hr tablet TAKE 1 TABLET (500 MG TOTAL) BY MOUTH DAILY. 30 tablet 3  . lisinopril (PRINIVIL,ZESTRIL) 20 MG tablet Take 20 mg by mouth daily.    . mirtazapine (REMERON) 15 MG tablet     . nitroGLYCERIN (NITROSTAT) 0.4 MG SL tablet Place 0.4 mg under the tongue every 5 (five) minutes as needed for chest pain.    . polyethylene glycol (MIRALAX / GLYCOLAX) packet Take 17 g by mouth daily as needed for moderate constipation.    . hydrALAZINE (APRESOLINE) 50 MG tablet Take 1 tablet (50 mg total) by mouth 4 (four) times daily. 120 tablet 3   No facility-administered  medications prior to visit.     ROS Review of Systems  Constitutional: Negative for chills and fever.  Respiratory: Negative for cough and shortness of breath.   Cardiovascular: Negative for chest pain and palpitations.  Gastrointestinal: Negative for abdominal pain, blood in stool, constipation, diarrhea, heartburn, melena, nausea and vomiting.  Genitourinary: Negative for dysuria and frequency.  Musculoskeletal: Positive for falls and joint pain.  Skin: Negative.   Neurological: Positive for weakness. Negative for headaches.  Psychiatric/Behavioral: Positive for depression and memory loss. Negative for suicidal ideas. The patient is not nervous/anxious and does not have insomnia.     Objective:  BP (!) 154/88 (BP Location: Right Arm, Cuff Size: Normal)   Pulse 71   Temp 97.6 F (36.4 C)   Ht 5\' 7"  (1.702 m)   Wt 189 lb (85.7 kg)   SpO2 99%   BMI 29.60 kg/m   BP Readings from Last 3 Encounters:  11/30/16 (!) 154/88  08/05/16 (!) 150/90  06/29/16 124/80    Wt Readings from Last 3 Encounters:  11/30/16 189 lb (85.7 kg)  06/29/16 180 lb (81.6 kg)  06/25/16 186 lb 11.2 oz (84.7 kg)    Physical Exam  Constitutional: She is oriented to person, place, and time. No distress.  Neck: Normal range of motion.  Neck supple.  Cardiovascular: Normal rate, regular rhythm and normal heart sounds.   Pulmonary/Chest: Effort normal and breath sounds normal.  Musculoskeletal: She exhibits edema.  Neurological: She is alert and oriented to person, place, and time. Gait abnormal.  Left hemiparesis  Skin: Skin is warm and dry. No rash noted. No erythema.  Vitals reviewed.   Lab Results  Component Value Date   WBC 5.3 06/29/2016   HGB 13.1 06/29/2016   HCT 39.5 06/29/2016   PLT 361.0 06/29/2016   GLUCOSE 126 (H) 06/29/2016   CHOL 100 12/08/2015   TRIG 94 06/23/2016   HDL 32.20 (L) 12/08/2015   LDLCALC 48 12/08/2015   ALT 16 06/29/2016   AST 11 06/29/2016   NA 143 06/29/2016    K 4.8 06/29/2016   CL 109 06/29/2016   CREATININE 1.53 (H) 06/29/2016   BUN 26 (H) 06/29/2016   CO2 27 06/29/2016   TSH 1.36 02/25/2016   INR 1.01 06/19/2016   HGBA1C 6.7 (H) 02/25/2016   MICROALBUR 48.8 (H) 05/14/2014    Ct Head Wo Contrast  Result Date: 06/19/2016 CLINICAL DATA:  Patient with seizure.  Altered mental status. EXAM: CT HEAD WITHOUT CONTRAST TECHNIQUE: Contiguous axial images were obtained from the base of the skull through the vertex without intravenous contrast. COMPARISON:  CT brain 09/01/2014. FINDINGS: Brain: Ventricles and sulci are prominent. Encephalomalacia demonstrated within the right occipital lobe. Old right frontal periventricular infarct. Extensive low-attenuation throughout the right cerebral hemisphere white matter. No evidence for intracranial hemorrhage, mass lesion or mass-effect. Vascular: Internal carotid arterial vascular calcifications. Skull: Intact. Sinuses/Orbits: Mastoid air cells unremarkable. Paranasal sinuses are well aerated. Other: None. IMPRESSION: Encephalomalacia within the right frontal, temporal and occipital lobes compatible with remote infarct. No acute intracranial hemorrhage, mass lesion or mass-effect. Electronically Signed   By: Lovey Newcomer M.D.   On: 06/19/2016 23:28   Mr Brain Wo Contrast  Result Date: 06/20/2016 CLINICAL DATA:  Altered mental status, last seen well yesterday morning. Slurred speech, LEFT-sided weakness. History of stroke, hypertension, diabetes. EXAM: MRI HEAD WITHOUT CONTRAST TECHNIQUE: Multiplanar, multiecho pulse sequences of the brain and surrounding structures were obtained without intravenous contrast. COMPARISON:  CT HEAD June 19, 2016 and MRI of the head August 30, 2014 FINDINGS: BRAIN: No reduced diffusion to suggest acute ischemia. No susceptibility artifact to suggest hemorrhage. RIGHT temporal occipital lobe encephalomalacia. RIGHT frontal lobe encephalomalacia. Mild ex vacuo dilatation RIGHT atrium,  ventricles and sulci are overall normal for patient's age. Patchy to confluent supratentorial pontine white matter FLAIR T2 hyperintensities. Patchy FLAIR T2 hyperintense signal in the basal ganglia and thalamic. Old RIGHT basal ganglia infarct. Asymmetrically smaller RIGHT cerebral peduncle compatible with wallerian degeneration. No intraparenchymal mass or mass effect. No abnormal extra-axial fluid collections. Symmetric size, morphology and signal of the hippocampi. VASCULAR: Normal major intracranial vascular flow voids present at skull base. SKULL AND UPPER CERVICAL SPINE: No abnormal sellar expansion. No suspicious calvarial bone marrow signal. Craniocervical junction maintained. SINUSES/ORBITS: The mastoid air-cells and included paranasal sinuses are well-aerated. Status post bilateral ocular lens implants. The included ocular globes and orbital contents are non-suspicious. OTHER: Patient is edentulous.  Life-support lines in place. IMPRESSION: No acute intracranial process. Old RIGHT middle cerebral artery and RIGHT posterior cerebral artery territory infarcts. Moderate chronic small vessel ischemic disease. Electronically Signed   By: Elon Alas M.D.   On: 06/20/2016 06:24   Dg Chest Portable 1 View  Result Date: 06/19/2016 CLINICAL DATA:  Post intubation.  Respiratory failure. EXAM:  PORTABLE CHEST 1 VIEW COMPARISON:  08/30/2014 CXR FINDINGS: Endotracheal tube tip is 3.2 cm above the carina in satisfactory position. Mild vascular congestion consistent with CHF. Cardiomegaly is noted. Aorta is tortuous. Patchy airspace opacities at the lung bases cannot exclude pneumonia. No suspicious osseous abnormality. IMPRESSION: Endotracheal tube tip in satisfactory position 3.2 cm above the carina. Mild CHF. Patchy airspace disease in both lower lobes cannot exclude pneumonia. Electronically Signed   By: Ashley Royalty M.D.   On: 06/19/2016 23:15    Assessment & Plan:   Suhailah was seen today for  follow-up.  Diagnoses and all orders for this visit:  Essential hypertension  Decreased strength, endurance, and mobility  Type 2 diabetes mellitus with diabetic neuropathy, without long-term current use of insulin ([place])   I have discontinued Ms. Carrera's hydrALAZINE. I have also changed her hydrALAZINE. Additionally, I am having her maintain her glucose blood, nitroGLYCERIN, polyethylene glycol, atorvastatin, isosorbide mononitrate, lisinopril, cetirizine, mirtazapine, atenolol, clopidogrel, levETIRAcetam, glipiZIDE, and [facility] VITAMIN B12.  Meds ordered this encounter  Medications  . [facility] VITAMIN B12 1000 MCG tablet    Sig: TAKE 1 TABLET (1,000 MCG TOTAL) BY MOUTH DAILY.    Refill:  12  . DISCONTD: hydrALAZINE (APRESOLINE) 50 MG tablet    Sig: TAKE 1 TABLET (50 MG TOTAL) BY MOUTH 4 (FOUR) TIMES DAILY.    Refill:  3  . hydrALAZINE (APRESOLINE) 50 MG tablet    Sig: Take 1 tablet (50 mg total) by mouth 3 (three) times daily.    Dispense:  120 tablet    Refill:  3    Order Specific Question:   Supervising Provider    Answer:   Cassandria Anger [[id number]]    Follow-up: Return in about 1 month (around 12/30/2016) for DM and HTN.  Wilfred Lacy, NP

## 2016-11-30 NOTE — Patient Instructions (Signed)
Check glucose once a day (before breakfast and record) Bring glucose readings to next OV.  Let me know which medications need to be refilled.  Community Occupational psychologist of Services Cost  A Matter of Balance Class locations vary. Call [place] on Aging for more information.  [email or web address] [phone] 8-Session program addressing the fear of falling and increasing activity levels of older adults Free to minimal cost  A.C.T. By [employer] [address], [place], [place] [address].  BetaBlues.[email or web address] [phone]  Personal training, gym, classes including Silver Sneakers* and ACTion for Aging Adults Fee-based  A.H.O.Y. (Add Health to [place]) Airs on Time [employer] 13, M-F at [place]: [employer],  [place] [facility] [place],  [place], [place] [facility], [address] [place], [place], [place], [place] [address]  [place] Location: Sharrell Ku. [employer] [place]      [phone]  [phone]  [phone]  [phone]  [phone]  [phone]  [phone]  [phone]  [phone]  [phone]    [phone] A total-body conditioning class for adults 39 and older; designed to increase muscular strength, endurance, range of movement, flexibility, balance, agility and coordination Free  [facility] [place], [place] [address] [place]      [address]      [phone]      Pilate's class for individualsreturning to exercise after an injury, before or after  surgery or for individuals with complex musculoskeletal issues; designed to improve strength, balance , flexibility      $15/class  [place] [address] [place], [place] [address] [email or web address] [place] classes for beginners to advanced [place], [place] [address] [email or web address] [place] Chair Exercises Free, ages 40 and older; Ages 53-59 fee based  Marvia Pickles, [facility] [address] [place], [place] [address] Seniorcenter_1 .Beverlee Nims [phone]  A.H.O.Y. Tai Chi Fee-based Donation based or free  [place] Class locations vary.  Call or email Angela Burke or view website for more information. Info_[email or web address] .com GainPain.com.cy.html [phone] Ongoing classes at local YMCAs and gyms Fee-based  Silver Sneakers A.C.T. By [place] [employer]: [place] [phone]  [phone] Classes designed for older adults who want to improve their strength, flexibility, balance and endurance.   Silver sneakers is covered by some insurance plans and includes a fitness center membership at participating locations. Find out more by calling [phone] or visiting [email or web address] Covered by some insurance plans  [facility] [place] [phone] A.H.O.Y., fitness room, personal training, fitness classes for injury prevention,  strength, balance, flexibility, water fitness classes Ages 55+: $74 for 6 months; Ages 60-54: $72 for 6 months  [place] for Everybody  [facility] [address] [place], [place] [address] Taichiforeverybody_0 .Patsi Sears [phone] Tai Chi classes for beginners to advanced; geared for seniors Donation Based      [employer] ([place] Others Participate in Exercise     Loyal Gambler. Rosana Hoes, PhD, [place] [email or web address] [place]     [phone]     A comprehensive fitness program for adults.  The program paris senior-level undergraduates Kinesiology students with adults who desire to learn how to exercise safely.  Includes a structural exercise class focusing on functional fitnesss     $100/semester in fall and spring; $75 in summer (no trainers)    *Silver Sneakers is covered by some Personal assistant and includes a  Radio producer at participating locations.  Find out more by calling [phone] or visiting [email or web address]  For additional health and human services resources for senior adults, please contact SeniorLine at [phone] in [place] and [place] at [phone] in all other areas.   Fall Prevention in the Home Falls can cause injuries. They can happen to people of all ages. There are many things you can do to make your home safe and to help prevent falls. What can I do on the outside of my home?  Regularly fix the edges of walkways and driveways and fix any cracks.  Remove anything that might make you trip as you walk through a door, such as a raised step or threshold.  Trim any bushes or trees on the path to your home.  Use bright outdoor lighting.  Clear any walking paths of anything that might make someone trip, such as rocks or tools.  Regularly check to see if handrails are loose or broken. Make sure that both sides of any steps have handrails.  Any raised decks and porches should have guardrails on the edges.  Have any  leaves, snow, or ice cleared regularly.  Use sand or salt on walking paths during winter.  Clean up any spills in your garage right away. This includes oil or grease spills. What can I do in the bathroom?  Use night lights.  Install grab bars by the toilet and in the tub and shower. Do not use towel bars as grab bars.  Use non-skid mats or decals in the tub or shower.  If you need to sit down in the shower, use a plastic, non-slip stool.  Keep the floor dry. Clean up any water that spills on the floor as soon as it happens.  Remove soap buildup in the tub or shower regularly.  Attach bath mats securely with double-sided non-slip rug tape.  Do not have throw rugs and other things on the floor that can make you trip. What can I do in the bedroom?  Use night lights.  Make sure that you have a light by your bed that is easy to reach.  Do not use any sheets or blankets that are too big for your bed. They should not hang down onto the floor.  Have a firm chair that has side arms. You can use this for support while you get dressed.  Do not have throw rugs and other things on the floor that can make you trip. What can I do in the kitchen?  Clean up any spills right away.  Avoid walking on wet floors.  Keep items that you use a lot in easy-to-reach places.  If you need to reach something above you, use a strong step stool that has a grab bar.  Keep  electrical cords out of the way.  Do not use floor polish or wax that makes floors slippery. If you must use wax, use non-skid floor wax.  Do not have throw rugs and other things on the floor that can make you trip. What can I do with my stairs?  Do not leave any items on the stairs.  Make sure that there are handrails on both sides of the stairs and use them. Fix handrails that are broken or loose. Make sure that handrails are as long as the stairways.  Check any carpeting to make sure that it is firmly attached to the stairs.  Fix any carpet that is loose or worn.  Avoid having throw rugs at the top or bottom of the stairs. If you do have throw rugs, attach them to the floor with carpet tape.  Make sure that you have a light switch at the top of the stairs and the bottom of the stairs. If you do not have them, ask someone to add them for you. What else can I do to help prevent falls?  Wear shoes that: ? Do not have high heels. ? Have rubber bottoms. ? Are comfortable and fit you well. ? Are closed at the toe. Do not wear sandals.  If you use a stepladder: ? Make sure that it is fully opened. Do not climb a closed stepladder. ? Make sure that both sides of the stepladder are locked into place. ? Ask someone to hold it for you, if possible.  Clearly mark and make sure that you can see: ? Any grab bars or handrails. ? First and last steps. ? Where the edge of each step is.  Use tools that help you move around (mobility aids) if they are needed. These include: ? Canes. ? Walkers. ? Scooters. ? Crutches.  Turn on the lights when you go into a dark area. Replace any light bulbs as soon as they burn out.  Set up your furniture so you have a clear path. Avoid moving your furniture around.  If any of your floors are uneven, fix them.  If there are any pets around you, be aware of where they are.  Review your medicines with your doctor. Some medicines can make you feel dizzy. This can increase your chance of falling. Ask your doctor what other things that you can do to help prevent falls. This information is not intended to replace advice given to you by your health care provider. Make sure you discuss any questions you have with your health care provider. Document Released: 04/03/2009 Document Revised: 11/13/2015 Document Reviewed: 07/12/2014 Elsevier Interactive Patient Education  [employer].

## 2016-11-30 NOTE — Telephone Encounter (Signed)
rx sent

## 2016-12-08 ENCOUNTER — Telehealth: Payer: Self-pay | Admitting: Internal Medicine

## 2016-12-08 DIAGNOSIS — N6459 Other signs and symptoms in breast: Secondary | ICD-10-CM

## 2016-12-08 NOTE — Telephone Encounter (Signed)
Mammogram orders for [facility] placed.

## 2016-12-08 NOTE — Telephone Encounter (Signed)
The pts daughter is trying to schedule a mammogram for her. She has noticed an uncomfortable feeling and some itching in her right breast. Kayla [name] is asking for a referral to be sent over so that she can get this scheduled. Please advise.

## 2016-12-10 NOTE — Telephone Encounter (Signed)
LVM to inform patient.  

## 2016-12-21 ENCOUNTER — Ambulatory Visit (INDEPENDENT_AMBULATORY_CARE_PROVIDER_SITE_OTHER): Payer: Medicare HMO | Admitting: *Deleted

## 2016-12-21 DIAGNOSIS — I6389 Other cerebral infarction: Secondary | ICD-10-CM

## 2016-12-21 DIAGNOSIS — I638 Other cerebral infarction: Secondary | ICD-10-CM

## 2016-12-23 ENCOUNTER — Telehealth: Payer: Self-pay

## 2016-12-23 NOTE — Telephone Encounter (Signed)
Patient is on the list for [employer] 2018 and may be a good candidate for an AWV. Please let me know if/when appt is scheduled.   

## 2016-12-23 NOTE — Progress Notes (Signed)
Carelink Summary Report / Loop Recorder 

## 2016-12-30 LAB — CUP PACEART REMOTE DEVICE CHECK
Date Time Interrogation Session: 20180703220930
MDC IDC PG IMPLANT DT: 20160315

## 2017-01-04 ENCOUNTER — Ambulatory Visit: Payer: Medicare HMO | Admitting: Nurse Practitioner

## 2017-01-13 ENCOUNTER — Encounter: Payer: Self-pay | Admitting: Family

## 2017-01-13 ENCOUNTER — Encounter: Payer: Self-pay | Admitting: Internal Medicine

## 2017-01-13 DIAGNOSIS — N644 Mastodynia: Secondary | ICD-10-CM | POA: Diagnosis not present

## 2017-01-13 LAB — HM MAMMOGRAPHY

## 2017-01-13 NOTE — Progress Notes (Signed)
Abstracted and sent to scan  

## 2017-01-14 ENCOUNTER — Encounter: Payer: Self-pay | Admitting: Internal Medicine

## 2017-01-14 ENCOUNTER — Ambulatory Visit (INDEPENDENT_AMBULATORY_CARE_PROVIDER_SITE_OTHER): Payer: Medicare HMO | Admitting: Nurse Practitioner

## 2017-01-14 ENCOUNTER — Encounter: Payer: Self-pay | Admitting: Nurse Practitioner

## 2017-01-14 VITALS — BP 146/80 | HR 79 | Temp 98.2°F | Ht 67.0 in | Wt 191.0 lb

## 2017-01-14 DIAGNOSIS — E1140 Type 2 diabetes mellitus with diabetic neuropathy, unspecified: Secondary | ICD-10-CM | POA: Diagnosis not present

## 2017-01-14 DIAGNOSIS — R569 Unspecified convulsions: Secondary | ICD-10-CM | POA: Diagnosis not present

## 2017-01-14 DIAGNOSIS — I69398 Other sequelae of cerebral infarction: Secondary | ICD-10-CM | POA: Diagnosis not present

## 2017-01-14 DIAGNOSIS — I10 Essential (primary) hypertension: Secondary | ICD-10-CM

## 2017-01-14 LAB — POCT GLYCOSYLATED HEMOGLOBIN (HGB A1C)

## 2017-01-14 MED ORDER — ATENOLOL 25 MG PO TABS: 12.5000 mg | ORAL_TABLET | Freq: Two times a day (BID) | ORAL | 1 refills | Status: DC

## 2017-01-14 MED ORDER — CLOPIDOGREL BISULFATE 75 MG PO TABS: 75.0000 mg | ORAL_TABLET | Freq: Every day | ORAL | 1 refills | Status: DC

## 2017-01-14 MED ORDER — GLIPIZIDE ER 2.5 MG PO TB24: 2.5000 mg | ORAL_TABLET | Freq: Two times a day (BID) | ORAL | 1 refills | Status: DC

## 2017-01-14 NOTE — Progress Notes (Signed)
Abstracted and sent to scan  

## 2017-01-14 NOTE — Patient Instructions (Signed)
Check glucose before breakfast and at bedtime, at least three times a week.  Check BP three times a week and record.  Bring Glucose and BP reading to next OV.

## 2017-01-14 NOTE — Progress Notes (Signed)
Subjective:  Patient ID: Kayla [name], female    DOB: 03-12-1941  Age: 76 y.o. MRN: [id number]  CC: Follow-up (1 mo fu--med refills? plavix,atenolol. itching on right breast? handicap placard form fill out--got it in room?)   HPI DM: does not check glucose at home.  HTN: Needs medications refill.  Outpatient Medications Prior to Visit  Medication Sig Dispense Refill  . atorvastatin (LIPITOR) 20 MG tablet Take 1 tablet (20 mg total) by mouth daily at 6 PM. 30 tablet 3  . cetirizine (ZYRTEC) 10 MG tablet Take 10 mg by mouth daily.    . [facility] VITAMIN B12 1000 MCG tablet TAKE 1 TABLET (1,000 MCG TOTAL) BY MOUTH DAILY.  12  . glucose blood (ACCU-CHEK AVIVA) test strip Check blood sugar once daily 100 each 4  . hydrALAZINE (APRESOLINE) 50 MG tablet Take 1 tablet (50 mg total) by mouth 3 (three) times daily. 120 tablet 3  . isosorbide mononitrate (IMDUR) 60 MG 24 hr tablet Take 60 mg by mouth daily.     [name] levETIRAcetam (KEPPRA XR) 500 MG 24 hr tablet TAKE 1 TABLET (500 MG TOTAL) BY MOUTH DAILY. 30 tablet 3  . lisinopril (PRINIVIL,ZESTRIL) 20 MG tablet Take 1 tablet (20 mg total) by mouth daily. 90 tablet 0  . nitroGLYCERIN (NITROSTAT) 0.4 MG SL tablet Place 0.4 mg under the tongue every 5 (five) minutes as needed for chest pain.    . polyethylene glycol (MIRALAX / GLYCOLAX) packet Take 17 g by mouth daily as needed for moderate constipation.    [name] atenolol (TENORMIN) 25 MG tablet Take 1 tablet by mouth 2 (two) times daily after a meal.    . clopidogrel (PLAVIX) 75 MG tablet TAKE 1 TABLET BY MOUTH EVERY DAY 30 tablet 3  . glipiZIDE (GLUCOTROL XL) 2.5 MG 24 hr tablet Take 1 tablet (2.5 mg total) by mouth daily with breakfast. 90 tablet 1   No facility-administered medications prior to visit.     ROS Review of Systems  Constitutional: Negative for malaise/fatigue.  Respiratory: Negative for cough and shortness of breath.   Cardiovascular: Positive for leg swelling. Negative for chest pain,  palpitations and orthopnea.  Gastrointestinal: Negative for abdominal pain, constipation, diarrhea and nausea.  Musculoskeletal: Negative for falls.  Skin: Negative.   Neurological: Negative for dizziness, weakness and headaches.    Objective:  BP (!) 146/80   Pulse 79   Temp 98.2 F (36.8 C)   Ht 5\' 7"  (1.702 m)   Wt 191 lb (86.6 kg)   SpO2 98%   BMI 29.91 kg/m   BP Readings from Last 3 Encounters:  01/14/17 (!) 146/80  11/30/16 (!) 154/88  08/05/16 (!) 150/90    Wt Readings from Last 3 Encounters:  01/14/17 191 lb (86.6 kg)  11/30/16 189 lb (85.7 kg)  06/29/16 180 lb (81.6 kg)    Physical Exam  Constitutional: She is oriented to person, place, and time. No distress.  Neck: No JVD present.  Cardiovascular: Normal rate and regular rhythm.   Pulmonary/Chest: Effort normal and breath sounds normal.  Abdominal: Soft. There is no tenderness.  Musculoskeletal: She exhibits edema.  Neurological: She is alert and oriented to person, place, and time.  Skin: Skin is warm and dry.  Vitals reviewed.   Lab Results  Component Value Date   WBC 5.3 06/29/2016   HGB 13.1 06/29/2016   HCT 39.5 06/29/2016   PLT 361.0 06/29/2016   GLUCOSE 126 (H) 06/29/2016   CHOL 100 12/08/2015  TRIG 94 06/23/2016   HDL 32.20 (L) 12/08/2015   LDLCALC 48 12/08/2015   ALT 16 06/29/2016   AST 11 06/29/2016   NA 143 06/29/2016   K 4.8 06/29/2016   CL 109 06/29/2016   CREATININE 1.53 (H) 06/29/2016   BUN 26 (H) 06/29/2016   CO2 27 06/29/2016   TSH 1.36 02/25/2016   INR 1.01 06/19/2016   HGBA1C 9.2% 01/14/2017   MICROALBUR 48.8 (H) 05/14/2014    Ct Head Wo Contrast  Result Date: 06/19/2016 CLINICAL DATA:  Patient with seizure.  Altered mental status. EXAM: CT HEAD WITHOUT CONTRAST TECHNIQUE: Contiguous axial images were obtained from the base of the skull through the vertex without intravenous contrast. COMPARISON:  CT brain 09/01/2014. FINDINGS: Brain: Ventricles and sulci are  prominent. Encephalomalacia demonstrated within the right occipital lobe. Old right frontal periventricular infarct. Extensive low-attenuation throughout the right cerebral hemisphere white matter. No evidence for intracranial hemorrhage, mass lesion or mass-effect. Vascular: Internal carotid arterial vascular calcifications. Skull: Intact. Sinuses/Orbits: Mastoid air cells unremarkable. Paranasal sinuses are well aerated. Other: None. IMPRESSION: Encephalomalacia within the right frontal, temporal and occipital lobes compatible with remote infarct. No acute intracranial hemorrhage, mass lesion or mass-effect. Electronically Signed   By: Lovey Newcomer M.D.   On: 06/19/2016 23:28   Mr Brain Wo Contrast  Result Date: 06/20/2016 CLINICAL DATA:  Altered mental status, last seen well yesterday morning. Slurred speech, LEFT-sided weakness. History of stroke, hypertension, diabetes. EXAM: MRI HEAD WITHOUT CONTRAST TECHNIQUE: Multiplanar, multiecho pulse sequences of the brain and surrounding structures were obtained without intravenous contrast. COMPARISON:  CT HEAD June 19, 2016 and MRI of the head August 30, 2014 FINDINGS: BRAIN: No reduced diffusion to suggest acute ischemia. No susceptibility artifact to suggest hemorrhage. RIGHT temporal occipital lobe encephalomalacia. RIGHT frontal lobe encephalomalacia. Mild ex vacuo dilatation RIGHT atrium, ventricles and sulci are overall normal for patient's age. Patchy to confluent supratentorial pontine white matter FLAIR T2 hyperintensities. Patchy FLAIR T2 hyperintense signal in the basal ganglia and thalamic. Old RIGHT basal ganglia infarct. Asymmetrically smaller RIGHT cerebral peduncle compatible with wallerian degeneration. No intraparenchymal mass or mass effect. No abnormal extra-axial fluid collections. Symmetric size, morphology and signal of the hippocampi. VASCULAR: Normal major intracranial vascular flow voids present at skull base. SKULL AND UPPER CERVICAL  SPINE: No abnormal sellar expansion. No suspicious calvarial bone marrow signal. Craniocervical junction maintained. SINUSES/ORBITS: The mastoid air-cells and included paranasal sinuses are well-aerated. Status post bilateral ocular lens implants. The included ocular globes and orbital contents are non-suspicious. OTHER: Patient is edentulous.  Life-support lines in place. IMPRESSION: No acute intracranial process. Old RIGHT middle cerebral artery and RIGHT posterior cerebral artery territory infarcts. Moderate chronic small vessel ischemic disease. Electronically Signed   By: Elon Alas M.D.   On: 06/20/2016 06:24   Dg Chest Portable 1 View  Result Date: 06/19/2016 CLINICAL DATA:  Post intubation.  Respiratory failure. EXAM: PORTABLE CHEST 1 VIEW COMPARISON:  08/30/2014 CXR FINDINGS: Endotracheal tube tip is 3.2 cm above the carina in satisfactory position. Mild vascular congestion consistent with CHF. Cardiomegaly is noted. Aorta is tortuous. Patchy airspace opacities at the lung bases cannot exclude pneumonia. No suspicious osseous abnormality. IMPRESSION: Endotracheal tube tip in satisfactory position 3.2 cm above the carina. Mild CHF. Patchy airspace disease in both lower lobes cannot exclude pneumonia. Electronically Signed   By: Ashley Royalty M.D.   On: 06/19/2016 23:15    Assessment & Plan:   Genesis was seen today for follow-up.  Diagnoses and all orders for this visit:  Type 2 diabetes mellitus with diabetic neuropathy, without long-term current use of insulin (HCC) -     POCT HgB A1C -     glipiZIDE (GLUCOTROL XL) 2.5 MG 24 hr tablet; Take 1 tablet (2.5 mg total) by mouth 2 (two) times daily between meals.  Essential hypertension -     clopidogrel (PLAVIX) 75 MG tablet; Take 1 tablet (75 mg total) by mouth daily. -     atenolol (TENORMIN) 25 MG tablet; Take 0.5 tablets (12.5 mg total) by mouth 2 (two) times daily after a meal.  Seizure as late effect of cerebrovascular accident  (CVA) ([place]) -     clopidogrel (PLAVIX) 75 MG tablet; Take 1 tablet (75 mg total) by mouth daily.   I have changed Ms. Herrmann's clopidogrel, glipiZIDE, and atenolol. I am also having her maintain her glucose blood, nitroGLYCERIN, polyethylene glycol, atorvastatin, isosorbide mononitrate, cetirizine, levETIRAcetam, [facility] VITAMIN B12, hydrALAZINE, and lisinopril.  Meds ordered this encounter  Medications  . clopidogrel (PLAVIX) 75 MG tablet    Sig: Take 1 tablet (75 mg total) by mouth daily.    Dispense:  90 tablet    Refill:  1    Order Specific Question:   Supervising Provider    Answer:   Cassandria Anger [[id number]]  . glipiZIDE (GLUCOTROL XL) 2.5 MG 24 hr tablet    Sig: Take 1 tablet (2.5 mg total) by mouth 2 (two) times daily between meals.    Dispense:  180 tablet    Refill:  1    Order Specific Question:   Supervising Provider    Answer:   Cassandria Anger [[id number]]  . atenolol (TENORMIN) 25 MG tablet    Sig: Take 0.5 tablets (12.5 mg total) by mouth 2 (two) times daily after a meal.    Dispense:  180 tablet    Refill:  1    Order Specific Question:   Supervising Provider    Answer:   Cassandria Anger [[id number]]    Follow-up: Return in about 3 months (around 04/16/2017) for DM and HTN with  Dr. Sharlet Salina.  Wilfred Lacy, NP

## 2017-01-20 ENCOUNTER — Ambulatory Visit (INDEPENDENT_AMBULATORY_CARE_PROVIDER_SITE_OTHER): Payer: Medicare HMO | Admitting: *Deleted

## 2017-01-20 DIAGNOSIS — I638 Other cerebral infarction: Secondary | ICD-10-CM | POA: Diagnosis not present

## 2017-01-20 DIAGNOSIS — I6389 Other cerebral infarction: Secondary | ICD-10-CM

## 2017-01-21 NOTE — Progress Notes (Signed)
Carelink Summary Report / Loop Recorder 

## 2017-01-28 ENCOUNTER — Other Ambulatory Visit: Payer: Self-pay | Admitting: Internal Medicine

## 2017-01-28 ENCOUNTER — Telehealth: Payer: Self-pay | Admitting: *Deleted

## 2017-01-28 NOTE — Telephone Encounter (Signed)
Spoke with patient's daughter (DPR) Kayla [name] regarding bill they are receiving once a month for LINQ. Advised patient's daughter the insurance gets billed once a month for a Device Tech and Dr. Rayann Heman to review a monthly summary report from the Loop recorder. Patient's daughter verbalized understanding.   Patient's daughter also inquired about the battery life of the Loop recorder. Advised patient's daughter the estimated battery replacement time is 3 years and at that time we will call the patient to discuss the next steps of explanting the LINQ vs. leaving the LINQ in place; at that point it will stop being monitored. Patient's daughter verbalized understanding and reported no further questions or concerns at this time.

## 2017-02-01 ENCOUNTER — Telehealth: Payer: Self-pay | Admitting: Internal Medicine

## 2017-02-01 LAB — CUP PACEART REMOTE DEVICE CHECK
Date Time Interrogation Session: 20180802224157
MDC IDC PG IMPLANT DT: 20160315

## 2017-02-01 NOTE — Telephone Encounter (Signed)
Called stating they believe Pt has a UTI, they would like orders for a urinalysis, if not they will make an appt.  Also the Pt insurance does not cover [place], they would like a new kit for RIGHT TOUCH so it will be covered by her insurance, please advise

## 2017-02-01 NOTE — Telephone Encounter (Signed)
Appt made with Baldo Ash on 8/16

## 2017-02-01 NOTE — Telephone Encounter (Signed)
Please have patient make an appointment and we can take care of both issues in one visit. Thanks

## 2017-02-03 ENCOUNTER — Ambulatory Visit (INDEPENDENT_AMBULATORY_CARE_PROVIDER_SITE_OTHER): Payer: Medicare HMO | Admitting: Nurse Practitioner

## 2017-02-03 ENCOUNTER — Encounter: Payer: Self-pay | Admitting: Nurse Practitioner

## 2017-02-03 ENCOUNTER — Other Ambulatory Visit (INDEPENDENT_AMBULATORY_CARE_PROVIDER_SITE_OTHER): Payer: Medicare HMO

## 2017-02-03 ENCOUNTER — Telehealth: Payer: Self-pay | Admitting: Internal Medicine

## 2017-02-03 VITALS — BP 133/90 | HR 81 | Temp 98.0°F | Ht 67.0 in | Wt 189.0 lb

## 2017-02-03 DIAGNOSIS — R410 Disorientation, unspecified: Secondary | ICD-10-CM

## 2017-02-03 DIAGNOSIS — E1140 Type 2 diabetes mellitus with diabetic neuropathy, unspecified: Secondary | ICD-10-CM | POA: Diagnosis not present

## 2017-02-03 DIAGNOSIS — R350 Frequency of micturition: Secondary | ICD-10-CM

## 2017-02-03 DIAGNOSIS — I10 Essential (primary) hypertension: Secondary | ICD-10-CM

## 2017-02-03 DIAGNOSIS — R69 Illness, unspecified: Secondary | ICD-10-CM | POA: Diagnosis not present

## 2017-02-03 LAB — CBC WITH DIFFERENTIAL/PLATELET
Basophils Absolute: 0 10*3/uL (ref 0.0–0.1)
Basophils Relative: 0.7 % (ref 0.0–3.0)
EOS ABS: 0.1 10*3/uL (ref 0.0–0.7)
Eosinophils Relative: 2.1 % (ref 0.0–5.0)
HCT: 43.5 % (ref 36.0–46.0)
HEMOGLOBIN: 14 g/dL (ref 12.0–15.0)
LYMPHS ABS: 1.5 10*3/uL (ref 0.7–4.0)
Lymphocytes Relative: 22.9 % (ref 12.0–46.0)
MCHC: 32.2 g/dL (ref 30.0–36.0)
MCV: 88.6 fl (ref 78.0–100.0)
Monocytes Absolute: 0.6 10*3/uL (ref 0.1–1.0)
Monocytes Relative: 8.7 % (ref 3.0–12.0)
NEUTROS PCT: 65.6 % (ref 43.0–77.0)
Neutro Abs: 4.4 10*3/uL (ref 1.4–7.7)
Platelets: 272 10*3/uL (ref 150.0–400.0)
RBC: 4.91 Mil/uL (ref 3.87–5.11)
RDW: 17.4 % — ABNORMAL HIGH (ref 11.5–15.5)
WBC: 6.6 10*3/uL (ref 4.0–10.5)

## 2017-02-03 LAB — POCT URINALYSIS DIPSTICK
BILIRUBIN UA: NEGATIVE
Blood, UA: NEGATIVE
KETONES UA: NEGATIVE
Leukocytes, UA: NEGATIVE
Nitrite, UA: NEGATIVE
PROTEIN UA: NEGATIVE
SPEC GRAV UA: 1.025 (ref 1.010–1.025)
Urobilinogen, UA: 0.2 E.U./dL
pH, UA: 6 (ref 5.0–8.0)

## 2017-02-03 LAB — BASIC METABOLIC PANEL
BUN: 29 mg/dL — AB (ref 6–23)
CALCIUM: 9.4 mg/dL (ref 8.4–10.5)
CO2: 27 mEq/L (ref 19–32)
CREATININE: 1.48 mg/dL — AB (ref 0.40–1.20)
Chloride: 105 mEq/L (ref 96–112)
GFR: 43.94 mL/min — AB (ref >60.00)
GLUCOSE: 247 mg/dL — AB (ref 70–99)
POTASSIUM: 4 meq/L (ref 3.5–5.1)
SODIUM: 139 meq/L (ref 135–145)

## 2017-02-03 LAB — TSH: TSH: 1.97 u[IU]/mL (ref 0.35–4.50)

## 2017-02-03 MED ORDER — BLOOD GLUCOSE MONITOR KIT: PACK | 1 refills | Status: DC

## 2017-02-03 MED ORDER — SITAGLIPTIN PHOSPHATE 25 MG PO TABS: 25.0000 mg | ORAL_TABLET | Freq: Every day | ORAL | 3 refills | Status: DC

## 2017-02-03 MED ORDER — METFORMIN HCL ER 750 MG PO TB24: 750.0000 mg | ORAL_TABLET | Freq: Every day | ORAL | 2 refills | Status: DC

## 2017-02-03 NOTE — Assessment & Plan Note (Signed)
Controlled with hydralazine, imdur, lisinopril, and atenolol.

## 2017-02-03 NOTE — Telephone Encounter (Signed)
[facility]oke with [facility], insurance will cover for the rx we sent in.   Daughter is aware.

## 2017-02-03 NOTE — Progress Notes (Signed)
Subjective:  Patient ID: Kayla [name], female    DOB: 1940/09/25  Age: 76 y.o. MRN: [id number]  CC: Urinary Tract Infection (mood change,frequent urinate at night,odor urine,confuse at times,appetie change for 4 days/request Right touch blood glucose kit written rx? old one insurance doesnt pay for/has some reading for BP and sugar)  Accompanied by grand daughter. Urinary Frequency   This is a new problem. The current episode started in the past 7 days. The problem has been unchanged. The pain is at a severity of 0/10. There has been no fever. She is not sexually active. There is no history of pyelonephritis. Associated symptoms include frequency. Pertinent negatives include no chills, flank pain, hesitancy, nausea or urgency.  daughter sent note with grand daughter with complains of altered mental status (confusion) and urine odor).   DM: Uncontrolled Home glucose reading of 150-250. Taking glipizide as prescribed.  Outpatient Medications Prior to Visit  Medication Sig Dispense Refill  . atenolol (TENORMIN) 25 MG tablet Take 0.5 tablets (12.5 mg total) by mouth 2 (two) times daily after a meal. 180 tablet 1  . atorvastatin (LIPITOR) 20 MG tablet Take 1 tablet (20 mg total) by mouth daily at 6 PM. 30 tablet 3  . cetirizine (ZYRTEC) 10 MG tablet Take 10 mg by mouth daily.    . clopidogrel (PLAVIX) 75 MG tablet Take 1 tablet (75 mg total) by mouth daily. 90 tablet 1  . [facility] VITAMIN B12 1000 MCG tablet TAKE 1 TABLET (1,000 MCG TOTAL) BY MOUTH DAILY.  12  . glipiZIDE (GLUCOTROL XL) 2.5 MG 24 hr tablet Take 1 tablet (2.5 mg total) by mouth 2 (two) times daily between meals. 180 tablet 1  . hydrALAZINE (APRESOLINE) 50 MG tablet Take 1 tablet (50 mg total) by mouth 3 (three) times daily. 120 tablet 3  . isosorbide mononitrate (IMDUR) 60 MG 24 hr tablet Take 60 mg by mouth daily.     [name] levETIRAcetam (KEPPRA XR) 500 MG 24 hr tablet TAKE 1 TABLET (500 MG TOTAL) BY MOUTH DAILY. 30 tablet 3  .  lisinopril (PRINIVIL,ZESTRIL) 20 MG tablet Take 1 tablet (20 mg total) by mouth daily. 90 tablet 0  . nitroGLYCERIN (NITROSTAT) 0.4 MG SL tablet Place 0.4 mg under the tongue every 5 (five) minutes as needed for chest pain.    . polyethylene glycol (MIRALAX / GLYCOLAX) packet Take 17 g by mouth daily as needed for moderate constipation.    [name] glucose blood (ACCU-CHEK AVIVA PLUS) test strip Need office visit for further refills (Patient not taking: Reported on 02/03/2017) 100 each 0  . glucose blood (ACCU-CHEK AVIVA) test strip Check blood sugar once daily (Patient not taking: Reported on 02/03/2017) 100 each 4   No facility-administered medications prior to visit.     ROS See HPI  Objective:  BP 133/90   Pulse 81   Temp 98 F (36.7 C)   Ht '5\' 7"'$  (1.702 m)   Wt 189 lb (85.7 kg)   SpO2 98%   BMI 29.60 kg/m   BP Readings from Last 3 Encounters:  02/03/17 133/90  01/14/17 (!) 146/80  11/30/16 (!) 154/88    Wt Readings from Last 3 Encounters:  02/03/17 189 lb (85.7 kg)  01/14/17 191 lb (86.6 kg)  11/30/16 189 lb (85.7 kg)    Physical Exam  Constitutional: She is oriented to person, place, and time. No distress.  Cardiovascular: Normal rate.   Pulmonary/Chest: Effort normal.  Abdominal: Soft. Bowel sounds are normal. She  exhibits no distension. There is no tenderness.  Neurological: She is alert and oriented to person, place, and time.  Vitals reviewed.   Lab Results  Component Value Date   WBC 6.6 02/03/2017   HGB 14.0 02/03/2017   HCT 43.5 02/03/2017   PLT 272.0 02/03/2017   GLUCOSE 247 (H) 02/03/2017   CHOL 100 12/08/2015   TRIG 94 06/23/2016   HDL 32.20 (L) 12/08/2015   LDLCALC 48 12/08/2015   ALT 16 06/29/2016   AST 11 06/29/2016   NA 139 02/03/2017   K 4.0 02/03/2017   CL 105 02/03/2017   CREATININE 1.48 (H) 02/03/2017   BUN 29 (H) 02/03/2017   CO2 27 02/03/2017   TSH 1.97 02/03/2017   INR 1.01 06/19/2016   HGBA1C 9.2% 01/14/2017   MICROALBUR 48.8 (H)  05/14/2014    Ct Head Wo Contrast  Result Date: 06/19/2016 CLINICAL DATA:  Patient with seizure.  Altered mental status. EXAM: CT HEAD WITHOUT CONTRAST TECHNIQUE: Contiguous axial images were obtained from the base of the skull through the vertex without intravenous contrast. COMPARISON:  CT brain 09/01/2014. FINDINGS: Brain: Ventricles and sulci are prominent. Encephalomalacia demonstrated within the right occipital lobe. Old right frontal periventricular infarct. Extensive low-attenuation throughout the right cerebral hemisphere white matter. No evidence for intracranial hemorrhage, mass lesion or mass-effect. Vascular: Internal carotid arterial vascular calcifications. Skull: Intact. Sinuses/Orbits: Mastoid air cells unremarkable. Paranasal sinuses are well aerated. Other: None. IMPRESSION: Encephalomalacia within the right frontal, temporal and occipital lobes compatible with remote infarct. No acute intracranial hemorrhage, mass lesion or mass-effect. Electronically Signed   By: Lovey Newcomer M.D.   On: 06/19/2016 23:28   Mr Brain Wo Contrast  Result Date: 06/20/2016 CLINICAL DATA:  Altered mental status, last seen well yesterday morning. Slurred speech, LEFT-sided weakness. History of stroke, hypertension, diabetes. EXAM: MRI HEAD WITHOUT CONTRAST TECHNIQUE: Multiplanar, multiecho pulse sequences of the brain and surrounding structures were obtained without intravenous contrast. COMPARISON:  CT HEAD June 19, 2016 and MRI of the head August 30, 2014 FINDINGS: BRAIN: No reduced diffusion to suggest acute ischemia. No susceptibility artifact to suggest hemorrhage. RIGHT temporal occipital lobe encephalomalacia. RIGHT frontal lobe encephalomalacia. Mild ex vacuo dilatation RIGHT atrium, ventricles and sulci are overall normal for patient's age. Patchy to confluent supratentorial pontine white matter FLAIR T2 hyperintensities. Patchy FLAIR T2 hyperintense signal in the basal ganglia and thalamic. Old  RIGHT basal ganglia infarct. Asymmetrically smaller RIGHT cerebral peduncle compatible with wallerian degeneration. No intraparenchymal mass or mass effect. No abnormal extra-axial fluid collections. Symmetric size, morphology and signal of the hippocampi. VASCULAR: Normal major intracranial vascular flow voids present at skull base. SKULL AND UPPER CERVICAL SPINE: No abnormal sellar expansion. No suspicious calvarial bone marrow signal. Craniocervical junction maintained. SINUSES/ORBITS: The mastoid air-cells and included paranasal sinuses are well-aerated. Status post bilateral ocular lens implants. The included ocular globes and orbital contents are non-suspicious. OTHER: Patient is edentulous.  Life-support lines in place. IMPRESSION: No acute intracranial process. Old RIGHT middle cerebral artery and RIGHT posterior cerebral artery territory infarcts. Moderate chronic small vessel ischemic disease. Electronically Signed   By: Elon Alas M.D.   On: 06/20/2016 06:24   Dg Chest Portable 1 View  Result Date: 06/19/2016 CLINICAL DATA:  Post intubation.  Respiratory failure. EXAM: PORTABLE CHEST 1 VIEW COMPARISON:  08/30/2014 CXR FINDINGS: Endotracheal tube tip is 3.2 cm above the carina in satisfactory position. Mild vascular congestion consistent with CHF. Cardiomegaly is noted. Aorta is tortuous. Patchy airspace opacities at  the lung bases cannot exclude pneumonia. No suspicious osseous abnormality. IMPRESSION: Endotracheal tube tip in satisfactory position 3.2 cm above the carina. Mild CHF. Patchy airspace disease in both lower lobes cannot exclude pneumonia. Electronically Signed   By: Ashley Royalty M.D.   On: 06/19/2016 23:15    Assessment & Plan:   Kayla [name] was seen today for urinary tract infection.  Diagnoses and all orders for this visit:  Frequent urination -     POCT urinalysis dipstick -     Urine Culture; Future  Disorientation -     POCT urinalysis dipstick -     Basic metabolic  panel; Future -     CBC w/Diff; Future -     TSH; Future  Type 2 diabetes mellitus with diabetic neuropathy, without long-term current use of insulin (HCC) -     blood glucose meter kit and supplies KIT; Dispense One Touch glucometer with strips and lancets. Use twice a day (before breakfast and at bedtime). E11.40 -     Basic metabolic panel; Future -     Discontinue: metFORMIN (GLUCOPHAGE XR) 750 MG 24 hr tablet; Take 1 tablet (750 mg total) by mouth daily with breakfast. -     sitaGLIPtin (JANUVIA) 25 MG tablet; Take 1 tablet (25 mg total) by mouth daily.  Essential hypertension   I have discontinued Ms. Worrall's glucose blood, glucose blood, and metFORMIN. I am also having her start on blood glucose meter kit and supplies and sitaGLIPtin. Additionally, I am having her maintain her nitroGLYCERIN, polyethylene glycol, atorvastatin, isosorbide mononitrate, cetirizine, levETIRAcetam, [facility] VITAMIN B12, hydrALAZINE, lisinopril, clopidogrel, glipiZIDE, and atenolol.  Meds ordered this encounter  Medications  . blood glucose meter kit and supplies KIT    Sig: Dispense One Touch glucometer with strips and lancets. Use twice a day (before breakfast and at bedtime). E11.40    Dispense:  1 each    Refill:  1    Order Specific Question:   Supervising Provider    Answer:   Cassandria Anger [[id number]]    Order Specific Question:   Number of strips    Answer:   100    Order Specific Question:   Number of lancets    Answer:   100  . DISCONTD: metFORMIN (GLUCOPHAGE XR) 750 MG 24 hr tablet    Sig: Take 1 tablet (750 mg total) by mouth daily with breakfast.    Dispense:  30 tablet    Refill:  2    Order Specific Question:   Supervising Provider    Answer:   Cassandria Anger [[id number]]  . sitaGLIPtin (JANUVIA) 25 MG tablet    Sig: Take 1 tablet (25 mg total) by mouth daily.    Dispense:  30 tablet    Refill:  3    Order Specific Question:   Supervising Provider    Answer:   Cassandria Anger [[id number]]    Follow-up: Return in about 3 months (around 05/06/2017) for with Dr. Sharlet Salina.  Wilfred Lacy, NP

## 2017-02-03 NOTE — Assessment & Plan Note (Signed)
Uncontrolled with hgbA1c 9.0 Unable to use metformin due to CKD stage 3. Continue glipizide. Start [place].

## 2017-02-03 NOTE — Patient Instructions (Addendum)
Go to basement for blood draw.  Your urine will be sent for culture.  Encourage adequate oral hydration.  Maintain current dose of glypizide. Start Januvia as prescribed

## 2017-02-03 NOTE — Telephone Encounter (Signed)
Pt daughter called asking if her mother can receive a right touch BS monitor  Please advise

## 2017-02-04 LAB — URINE CULTURE

## 2017-02-07 ENCOUNTER — Other Ambulatory Visit: Payer: Self-pay | Admitting: Internal Medicine

## 2017-02-07 ENCOUNTER — Telehealth: Payer: Self-pay | Admitting: Nurse Practitioner

## 2017-02-07 NOTE — Telephone Encounter (Signed)
Referral to nephrology is not appropriate at this time. Kidney disease is as results of long standing DM and HTN.  Key to treatment is controlled HTN and DM to keep progression of kidney disease. Referral to nephrology is entered when there is an imbalance in electrolyte and/or edema.

## 2017-02-07 NOTE — Telephone Encounter (Signed)
Spoke with [place], she verbalized understand.

## 2017-02-07 NOTE — Telephone Encounter (Signed)
Pt daughter called sating she would like a referral to Dr. Erling Cruz for her mother kidney issues

## 2017-02-07 NOTE — Telephone Encounter (Signed)
Spoke with Shannan Harper (daughter) she is very concern about Kayla [name] stage 3 kidney disease so she want to get referral to Dr. Florene Glen to make sure everything is good. She said this is the first time they ever inform about this. Please advise.

## 2017-02-22 ENCOUNTER — Ambulatory Visit (INDEPENDENT_AMBULATORY_CARE_PROVIDER_SITE_OTHER): Payer: Medicare HMO | Admitting: *Deleted

## 2017-02-22 DIAGNOSIS — I638 Other cerebral infarction: Secondary | ICD-10-CM | POA: Diagnosis not present

## 2017-02-22 DIAGNOSIS — I6389 Other cerebral infarction: Secondary | ICD-10-CM

## 2017-02-24 ENCOUNTER — Other Ambulatory Visit: Payer: Self-pay | Admitting: Internal Medicine

## 2017-02-24 NOTE — Progress Notes (Signed)
Carelink Summary Report / Loop Recorder 

## 2017-02-27 LAB — CUP PACEART REMOTE DEVICE CHECK
Implantable Pulse Generator Implant Date: 20160315
MDC IDC SESS DTM: 20180901224145

## 2017-03-04 ENCOUNTER — Other Ambulatory Visit: Payer: Self-pay | Admitting: Internal Medicine

## 2017-03-04 ENCOUNTER — Other Ambulatory Visit: Payer: Self-pay | Admitting: Family Medicine

## 2017-03-04 MED ORDER — LEVETIRACETAM ER 500 MG PO TB24: 500.0000 mg | ORAL_TABLET | Freq: Every day | ORAL | 3 refills | Status: DC

## 2017-03-15 NOTE — Telephone Encounter (Signed)
Would you be able to call patient?

## 2017-03-21 ENCOUNTER — Ambulatory Visit (INDEPENDENT_AMBULATORY_CARE_PROVIDER_SITE_OTHER): Payer: Medicare HMO | Admitting: *Deleted

## 2017-03-21 DIAGNOSIS — I6389 Other cerebral infarction: Secondary | ICD-10-CM | POA: Diagnosis not present

## 2017-03-22 LAB — CUP PACEART REMOTE DEVICE CHECK
Date Time Interrogation Session: 20181001233856
MDC IDC PG IMPLANT DT: 20160315

## 2017-03-22 NOTE — Progress Notes (Signed)
Loop recorder summary report 

## 2017-03-26 ENCOUNTER — Ambulatory Visit (INDEPENDENT_AMBULATORY_CARE_PROVIDER_SITE_OTHER): Payer: Medicare HMO | Admitting: Family Medicine

## 2017-03-26 DIAGNOSIS — T161XXA Foreign body in right ear, initial encounter: Secondary | ICD-10-CM | POA: Insufficient documentation

## 2017-03-26 DIAGNOSIS — H6122 Impacted cerumen, left ear: Secondary | ICD-10-CM

## 2017-03-26 NOTE — Assessment & Plan Note (Addendum)
With impacted cerumen. Ceruminosis is noted.  Wax is removed by syringing and manual debridement but unable to remove piece of plastic from her hearing aid. Will refer to ENT urgently to help with removal of foreign object. The patient and her daughter indicate understanding of these issues and agrees with the plan.

## 2017-03-26 NOTE — Progress Notes (Signed)
Subjective:   Patient ID: Kayla [name], female    DOB: [birth year], 76 y.o.   MRN: [id number]  Kayla [name] is a pleasant 76 y.o. year old female, new to me, who presents to weekend clinic today with her daughter for right Ear Pain (missing piece of hearing aid possibly; feels stopped up)  on 03/26/2017  HPI:  She has been complaining of right ear pain/pressure.  Daughter noticed a piece of her hearing aid was missing yesterday.  No URI symptoms.  No fever or drainage.   Current Outpatient Prescriptions on File Prior to Visit  Medication Sig Dispense Refill  . atenolol (TENORMIN) 25 MG tablet Take 0.5 tablets (12.5 mg total) by mouth 2 (two) times daily after a meal. (Patient not taking: Reported on 03/26/2017) 180 tablet 1  . atorvastatin (LIPITOR) 20 MG tablet Take 1 tablet (20 mg total) by mouth daily at 6 PM. (Patient not taking: Reported on 03/26/2017) 30 tablet 3  . blood glucose meter kit and supplies KIT Dispense One Touch glucometer with strips and lancets. Use twice a day (before breakfast and at bedtime). E11.40 (Patient not taking: Reported on 03/26/2017) 1 each 1  . cetirizine (ZYRTEC) 10 MG tablet Take 10 mg by mouth daily.    . clopidogrel (PLAVIX) 75 MG tablet Take 1 tablet (75 mg total) by mouth daily. (Patient not taking: Reported on 03/26/2017) 90 tablet 1  . [facility] VITAMIN B12 1000 MCG tablet TAKE 1 TABLET (1,000 MCG TOTAL) BY MOUTH DAILY.  12  . glipiZIDE (GLUCOTROL XL) 2.5 MG 24 hr tablet Take 1 tablet (2.5 mg total) by mouth 2 (two) times daily between meals. (Patient not taking: Reported on 03/26/2017) 180 tablet 1  . hydrALAZINE (APRESOLINE) 50 MG tablet Take 1 tablet (50 mg total) by mouth 3 (three) times daily. 120 tablet 3  . isosorbide mononitrate (IMDUR) 60 MG 24 hr tablet Take 60 mg by mouth daily.     [name] levETIRAcetam (KEPPRA XR) 500 MG 24 hr tablet Take 1 tablet (500 mg total) by mouth daily. (Patient not taking: Reported on 03/26/2017) 30 tablet 3  .  lisinopril (PRINIVIL,ZESTRIL) 20 MG tablet Take 1 tablet (20 mg total) by mouth daily. Keep appt in Nov for future refills (Patient not taking: Reported on 03/26/2017) 90 tablet 0  . nitroGLYCERIN (NITROSTAT) 0.4 MG SL tablet Place 0.4 mg under the tongue every 5 (five) minutes as needed for chest pain.    . polyethylene glycol (MIRALAX / GLYCOLAX) packet Take 17 g by mouth daily as needed for moderate constipation.    . sitaGLIPtin (JANUVIA) 25 MG tablet Take 1 tablet (25 mg total) by mouth daily. (Patient not taking: Reported on 03/26/2017) 30 tablet 3   No current facility-administered medications on file prior to visit.     Allergies  Allergen Reactions  . Amlodipine Shortness Of Breath and Rash  . Fish Allergy Hives  . Penicillins Other (See Comments)    REACTION: red rash  . Shellfish Allergy Hives  . Peanuts [Peanut Oil] Rash  . Tomato Rash    Past Medical History:  Diagnosis Date  . Allergy    Shell fish  . Anxiety   . Arthritis    "knees, back, probably left hand" (01/14/2015)  . Asthmatic bronchitis   . Blind left eye   . Constipation   . Coronary artery disease   . Depression   . Diverticulosis of colon   . DJD (degenerative joint disease)   .  Fibromyalgia   . GERD (gastroesophageal reflux disease)   . Heart murmur   . History of gout   . Hypercholesteremia   . Hypertension   . Personal history of noncompliance with medical treatment, presenting hazards to health   . Prolapse of vaginal vault after hysterectomy   . Proteinuria   . Sickle-cell trait (HCC)   . Somatic dysfunction   . Stroke [facility]) ~ 08/2014   "blind in left eye; weak on left side since" (01/14/2015)  . Type II diabetes mellitus (HCC)   . Venous insufficiency     Past Surgical History:  Procedure Laterality Date  . ABDOMINAL HYSTERECTOMY    . CARDIAC CATHETERIZATION N/A 01/14/2015   Procedure: Left Heart Cath and Coronary Angiography;  Surgeon: Rinaldo Cloud, MD;  Location: [facility] INVASIVE CV LAB;   Service: Cardiovascular;  Laterality: N/A;  . CARDIAC CATHETERIZATION  ~ 2011  . CATARACT EXTRACTION W/ INTRAOCULAR LENS  IMPLANT, BILATERAL Bilateral 2012  . CORONARY ANGIOPLASTY    . DILATION AND CURETTAGE OF UTERUS  "probably"  . LOOP RECORDER IMPLANT N/A 09/03/2014   Procedure: LOOP RECORDER IMPLANT;  Surgeon: Hillis Range, MD;  Location: [facility] CATH LAB;  Service: Cardiovascular;  Laterality: N/A;  . ROBOTIC ASSISTED LAPAROSCOPIC SACROCOLPOPEXY  09/2009   Dr. Sherron Monday  . TEE WITHOUT CARDIOVERSION N/A 09/02/2014   Procedure: TRANSESOPHAGEAL ECHOCARDIOGRAM (TEE);  Surgeon: Lewayne Bunting, MD;  Location: [facility] ENDOSCOPY;  Service: Cardiovascular;  Laterality: N/A;    Family History  Problem Relation Age of Onset  . Prostate cancer Father   . Hypertension Father   . Seizures Sister   . Hypertension Sister   . Bell's palsy Son   . Heart attack Neg Hx   . Stroke Neg Hx     Social History   Social History  . Marital status: Married    Spouse name: N/A  . Number of children: 4  . Years of education: 83   Occupational History  . [employer]     retired   Social History Main Topics  . Smoking status: Never Smoker  . Smokeless tobacco: Never Used  . Alcohol use 0.0 oz/week     Comment: 01/14/2015 "might have a glass of wine a few times/yr"  . Drug use: No  . Sexual activity: No   Other Topics Concern  . Not on file   Social History Narrative   Married   Right handed   Caffeine use - 1 cup coffee daily   The PMH, PSH, Social History, Family History, Medications, and allergies have been reviewed in [facility], and have been updated if relevant.   Review of Systems  Constitutional: Negative.   HENT: Positive for ear pain and hearing loss. Negative for congestion, dental problem, drooling, ear discharge, facial swelling, mouth sores, nosebleeds, postnasal drip, rhinorrhea, sinus pain, sinus pressure, sneezing, sore throat, tinnitus, trouble swallowing and voice change.   All other  systems reviewed and are negative.      Objective:    BP (!) 160/88 (BP Location: Left Arm, Patient Position: Sitting, Cuff Size: Normal)   Pulse 83   Temp 97.9 F (36.6 C) (Oral)   Resp 16   Ht 5\' 7"  (1.702 m)   Wt 192 lb 3.2 oz (87.2 kg)   SpO2 96%   BMI 30.10 kg/m    Physical Exam  Constitutional: She is oriented to person, place, and time. She appears well-developed and well-nourished. No distress.  HENT:  Head: Normocephalic and atraumatic.  Left Ear:  Tympanic membrane, external ear and ear canal normal.  Small plastic object in left ear, some cerumen impaction  Eyes: Conjunctivae are normal.  Cardiovascular: Normal rate.   Pulmonary/Chest: Effort normal.  Musculoskeletal: Normal range of motion.  Neurological: She is alert and oriented to person, place, and time. No cranial nerve deficit.  Skin: Skin is warm and dry. She is not diaphoretic.  Psychiatric: She has a normal mood and affect. Her behavior is normal. Judgment and thought content normal.  Nursing note and vitals reviewed.         Assessment & Plan:   Foreign body of right ear, initial encounter No Follow-up on file.

## 2017-03-26 NOTE — Progress Notes (Signed)
Pre visit review using our clinic review tool, if applicable. No additional management support is needed unless otherwise documented below in the visit note. 

## 2017-03-28 ENCOUNTER — Ambulatory Visit (INDEPENDENT_AMBULATORY_CARE_PROVIDER_SITE_OTHER): Payer: Medicare HMO | Admitting: Otolaryngology

## 2017-03-28 DIAGNOSIS — H9201 Otalgia, right ear: Secondary | ICD-10-CM | POA: Diagnosis not present

## 2017-03-28 DIAGNOSIS — T161XXA Foreign body in right ear, initial encounter: Secondary | ICD-10-CM

## 2017-04-20 ENCOUNTER — Ambulatory Visit (INDEPENDENT_AMBULATORY_CARE_PROVIDER_SITE_OTHER): Payer: Medicare HMO | Admitting: *Deleted

## 2017-04-20 DIAGNOSIS — I6389 Other cerebral infarction: Secondary | ICD-10-CM | POA: Diagnosis not present

## 2017-04-22 NOTE — Progress Notes (Signed)
Carelink Summary Report / Loop Recorder 

## 2017-04-25 LAB — CUP PACEART REMOTE DEVICE CHECK
MDC IDC PG IMPLANT DT: 20160315
MDC IDC SESS DTM: 20181031234035

## 2017-05-20 ENCOUNTER — Ambulatory Visit (INDEPENDENT_AMBULATORY_CARE_PROVIDER_SITE_OTHER): Payer: Medicare HMO | Admitting: *Deleted

## 2017-05-20 DIAGNOSIS — I6389 Other cerebral infarction: Secondary | ICD-10-CM | POA: Diagnosis not present

## 2017-05-22 ENCOUNTER — Other Ambulatory Visit: Payer: Self-pay | Admitting: Internal Medicine

## 2017-05-23 NOTE — Progress Notes (Signed)
Carelink Summary Report / Loop Recorder 

## 2017-05-31 LAB — CUP PACEART REMOTE DEVICE CHECK
MDC IDC PG IMPLANT DT: 20160315
MDC IDC SESS DTM: 20181201004008

## 2017-06-02 ENCOUNTER — Other Ambulatory Visit: Payer: Self-pay | Admitting: Internal Medicine

## 2017-06-08 ENCOUNTER — Other Ambulatory Visit: Payer: Self-pay | Admitting: Family Medicine

## 2017-06-08 DIAGNOSIS — E1140 Type 2 diabetes mellitus with diabetic neuropathy, unspecified: Secondary | ICD-10-CM

## 2017-06-10 DIAGNOSIS — R69 Illness, unspecified: Secondary | ICD-10-CM | POA: Diagnosis not present

## 2017-06-10 DIAGNOSIS — G44209 Tension-type headache, unspecified, not intractable: Secondary | ICD-10-CM | POA: Diagnosis not present

## 2017-06-10 DIAGNOSIS — E785 Hyperlipidemia, unspecified: Secondary | ICD-10-CM | POA: Diagnosis not present

## 2017-06-10 DIAGNOSIS — R7309 Other abnormal glucose: Secondary | ICD-10-CM | POA: Diagnosis not present

## 2017-06-10 DIAGNOSIS — I10 Essential (primary) hypertension: Secondary | ICD-10-CM | POA: Diagnosis not present

## 2017-06-10 MED ORDER — SITAGLIPTIN PHOSPHATE 25 MG PO TABS: 25.0000 mg | ORAL_TABLET | Freq: Every day | ORAL | 0 refills | Status: DC

## 2017-06-10 NOTE — Telephone Encounter (Signed)
Copied from [place] [phone]. Topic: Quick Communication - Rx Refill/Question >> Jun 10, 2017  9:29 AM Yvette Rack wrote: Has the patient contacted their pharmacy? Yes.     (Agent: If no, request that the patient contact the pharmacy for the refill.)sitaGLIPtin (JANUVIA) 25 MG tablet patient has 4 pills left   Preferred Pharmacy (with phone number or street name): [id number] [address], [place] [phone] (Phone) [phone] (Fax)     Agent: Please be advised that RX refills may take up to 3 business days. We ask that you follow-up with your pharmacy.

## 2017-06-20 ENCOUNTER — Ambulatory Visit (INDEPENDENT_AMBULATORY_CARE_PROVIDER_SITE_OTHER): Payer: Medicare HMO | Admitting: *Deleted

## 2017-06-20 DIAGNOSIS — I6389 Other cerebral infarction: Secondary | ICD-10-CM

## 2017-06-22 NOTE — Progress Notes (Signed)
Carelink Summary Report / Loop Recorder 

## 2017-06-24 ENCOUNTER — Other Ambulatory Visit: Payer: Self-pay | Admitting: Family Medicine

## 2017-06-30 ENCOUNTER — Other Ambulatory Visit: Payer: Self-pay | Admitting: Family Medicine

## 2017-06-30 ENCOUNTER — Other Ambulatory Visit: Payer: Self-pay | Admitting: Internal Medicine

## 2017-06-30 DIAGNOSIS — E1140 Type 2 diabetes mellitus with diabetic neuropathy, unspecified: Secondary | ICD-10-CM

## 2017-07-05 LAB — CUP PACEART REMOTE DEVICE CHECK
Date Time Interrogation Session: 20181231003907
MDC IDC PG IMPLANT DT: 20160315

## 2017-07-06 ENCOUNTER — Telehealth: Payer: Self-pay | Admitting: Internal Medicine

## 2017-07-06 NOTE — Telephone Encounter (Signed)
LVM informing patient to call her insurance company to see what they cover and then call us back and we can get that sent in

## 2017-07-06 NOTE — Telephone Encounter (Signed)
>>  Jul 06, 2017  1:01 PM Kayla [name], NT wrote: Patient is calling back and states they did not give her a a alternative for Januvia because the price is high because she has not met her deductible. She states she did find a cheaper version of her Levetiracetam-er and that was Oxcarbamavepine. She would like that discussed with Dr. Sharlet Salina and if she thinks that would be a good switch then she would like the cheaper one and if not then she will keep her Levetiracetam. Please advise. Patient is also wanting to see if she can get physical therapy. She says her left side is weak again and would like help getting that strength back.

## 2017-07-06 NOTE — Telephone Encounter (Signed)
Copied from [place] [phone]. Topic: Quick Communication - See Telephone Encounter >> Jul 06, 2017  8:51 AM Ether Griffins B wrote: CRM for notification. See Telephone encounter for:  Pt calling in stating she can no longer afford Januvia and she isn't eligible for a coupon and is needing something else called in.  07/06/17.

## 2017-07-07 NOTE — Telephone Encounter (Signed)
Can you please schedule an appointment for patient to discuss any medication alternatives and for wanting PT. Thank you

## 2017-07-07 NOTE — Telephone Encounter (Signed)
Would need a visit to discuss any alternatives. We will not change her seizure medication keppra.

## 2017-07-08 NOTE — Telephone Encounter (Signed)
Appointment scheduled.

## 2017-07-15 ENCOUNTER — Ambulatory Visit: Payer: Medicare HMO | Admitting: Internal Medicine

## 2017-07-18 ENCOUNTER — Other Ambulatory Visit: Payer: Self-pay | Admitting: Internal Medicine

## 2017-07-18 DIAGNOSIS — I69398 Other sequelae of cerebral infarction: Secondary | ICD-10-CM

## 2017-07-18 DIAGNOSIS — I10 Essential (primary) hypertension: Secondary | ICD-10-CM

## 2017-07-18 DIAGNOSIS — R569 Unspecified convulsions: Secondary | ICD-10-CM

## 2017-07-19 ENCOUNTER — Ambulatory Visit (INDEPENDENT_AMBULATORY_CARE_PROVIDER_SITE_OTHER): Payer: Medicare HMO | Admitting: *Deleted

## 2017-07-19 DIAGNOSIS — I6389 Other cerebral infarction: Secondary | ICD-10-CM

## 2017-07-20 NOTE — Progress Notes (Signed)
Carelink Summary Report / Loop Recorder 

## 2017-07-28 ENCOUNTER — Telehealth: Payer: Self-pay | Admitting: Nurse Practitioner

## 2017-07-28 NOTE — Telephone Encounter (Signed)
Spoke with Kayla [name]- she is under the impression her monitor is not working. I assured her that it is and we have received data as late as this morning. She would like for me to call Pam, her daughter to explain.   Olin Hauser [facility]) is concerned about the monitor- I let her know that the screen will stay black but it is transmitting data appropriately. She does not want her mother to be billed after the 3 year battery life- I let her know that we call once we receive the battery alert and send out a return kit for the home monitor. We only bill for processing data that we get until the battery alert. She verbalizes understanding and is appreciative.

## 2017-07-28 NOTE — Telephone Encounter (Signed)
New message   1. Has your device fired? no  2. Is you device beeping? no  3. Are you experiencing draining or swelling at device site? no  4. Are you calling to see if we received your device transmission? no  5. Have you passed out? No  Pt wants to know when she can get her heart monitor off because she is being charged for it. Please call  Please route to [place]

## 2017-08-02 LAB — CUP PACEART REMOTE DEVICE CHECK
Date Time Interrogation Session: 20190130010817
MDC IDC PG IMPLANT DT: 20160315

## 2017-08-05 DIAGNOSIS — R69 Illness, unspecified: Secondary | ICD-10-CM | POA: Diagnosis not present

## 2017-08-22 ENCOUNTER — Ambulatory Visit (INDEPENDENT_AMBULATORY_CARE_PROVIDER_SITE_OTHER): Payer: Medicare HMO | Admitting: *Deleted

## 2017-08-22 DIAGNOSIS — I6389 Other cerebral infarction: Secondary | ICD-10-CM | POA: Diagnosis not present

## 2017-08-22 NOTE — Progress Notes (Signed)
Carelink Summary Report / Loop Recorder 

## 2017-08-24 ENCOUNTER — Telehealth: Payer: Self-pay | Admitting: Internal Medicine

## 2017-08-24 ENCOUNTER — Telehealth: Payer: Self-pay | Admitting: Cardiology

## 2017-08-24 NOTE — Telephone Encounter (Signed)
LMOVM requesting that pt send manual transmission b/c home monitor has not updated in at least 14 days.    

## 2017-08-24 NOTE — Telephone Encounter (Signed)
Copied from [place] [phone]. Topic: General - Other >> Aug 24, 2017 11:28 AM Yvette Rack wrote: Reason for CRM: patient calling to see if Dr Sharlet Salina could write her a RX for Glycopyrrolate tablet  patient states that the pharmacist says that this medicine works for excessive sweating and drooling patient also states that she had went to a dentist and a ENT and they both says that they didn't see why the reason was for pt to drool so much

## 2017-08-25 NOTE — Telephone Encounter (Signed)
Appt shcheduled

## 2017-08-25 NOTE — Telephone Encounter (Signed)
Can you please make patient an appointment if she want this medication prescribed. Thank you

## 2017-08-25 NOTE — Telephone Encounter (Signed)
There is not a medical indication for this drug on those symptoms. We would need to discuss in the office as it can come with some side effects and this is an off label use for the medicine.

## 2017-08-26 ENCOUNTER — Telehealth: Payer: Self-pay | Admitting: Internal Medicine

## 2017-08-26 ENCOUNTER — Encounter: Payer: Self-pay | Admitting: Internal Medicine

## 2017-08-26 ENCOUNTER — Ambulatory Visit (INDEPENDENT_AMBULATORY_CARE_PROVIDER_SITE_OTHER): Payer: Medicare HMO | Admitting: Internal Medicine

## 2017-08-26 VITALS — BP 124/84 | HR 88 | Temp 97.9°F | Ht 67.0 in | Wt 190.0 lb

## 2017-08-26 DIAGNOSIS — R531 Weakness: Secondary | ICD-10-CM | POA: Diagnosis not present

## 2017-08-26 DIAGNOSIS — K117 Disturbances of salivary secretion: Secondary | ICD-10-CM

## 2017-08-26 DIAGNOSIS — Z7409 Other reduced mobility: Secondary | ICD-10-CM | POA: Diagnosis not present

## 2017-08-26 DIAGNOSIS — Z8673 Personal history of transient ischemic attack (TIA), and cerebral infarction without residual deficits: Secondary | ICD-10-CM

## 2017-08-26 DIAGNOSIS — E119 Type 2 diabetes mellitus without complications: Secondary | ICD-10-CM | POA: Diagnosis not present

## 2017-08-26 LAB — HM DIABETES EYE EXAM

## 2017-08-26 MED ORDER — ATROPINE SULFATE 1 % OP SOLN: 2.0000 [drp] | Freq: Three times a day (TID) | OPHTHALMIC | 12 refills | Status: DC | PRN

## 2017-08-26 NOTE — Telephone Encounter (Signed)
Please advise 

## 2017-08-26 NOTE — Patient Instructions (Addendum)
We have sent in the atropine eye drops to use 2 drops under the tongue 3 times a day as needed.   Call us about the medicine and we can change if needed.   We will get you in with the therapy to work on the balance.

## 2017-08-26 NOTE — Telephone Encounter (Signed)
Copied from [place] [id number]. Topic: Quick Communication - See Telephone Encounter >> Aug 26, 2017  3:59 PM Lolita Rieger, RMA wrote: CRM for notification. See Telephone encounter for:   08/26/17.pt was taking her glipizide once a day per her daughter Olin Hauser

## 2017-08-26 NOTE — Progress Notes (Signed)
   Subjective:    Patient ID: Kayla [name], female    DOB: 09-21-1940, 77 y.o.   MRN: [id number]  HPI The patient is a 77 YO female coming in for excessive drooling. She has heard about glycopyrrolate and wants to try it. She denies dental problems and has been to the dentist and they could not find a reason for the drooling. It is embarrassing for her. She has constipation already most days.   Review of Systems  Constitutional: Negative.   HENT: Positive for drooling. Negative for congestion, dental problem, rhinorrhea, sinus pain, sore throat, trouble swallowing and voice change.   Eyes: Negative.   Respiratory: Negative for cough, chest tightness and shortness of breath.   Cardiovascular: Negative for chest pain, palpitations and leg swelling.  Gastrointestinal: Negative for abdominal distention, abdominal pain, constipation, diarrhea, nausea and vomiting.  Endocrine:       Sweating  Musculoskeletal: Negative.   Skin: Negative.   Neurological: Negative.   Psychiatric/Behavioral: Negative.       Objective:   Physical Exam  Constitutional: She is oriented to person, place, and time. She appears well-developed and well-nourished.  HENT:  Head: Normocephalic and atraumatic.  No drooling on exam  Eyes: EOM are normal.  Neck: Normal range of motion.  Cardiovascular: Normal rate and regular rhythm.  Pulmonary/Chest: Effort normal and breath sounds normal. No respiratory distress. She has no wheezes. She has no rales.  Abdominal: Soft. Bowel sounds are normal. She exhibits no distension. There is no tenderness. There is no rebound.  Musculoskeletal: She exhibits no edema.  Neurological: She is alert and oriented to person, place, and time. A cranial nerve deficit is present. Coordination abnormal.  Skin: Skin is warm and dry.   Vitals:   08/26/17 1358  BP: 124/84  Pulse: 88  Temp: 97.9 F (36.6 C)  TempSrc: Oral  SpO2: 99%  Weight: 190 lb (86.2 kg)  Height: 5\' 7"  (1.702 m)        Assessment & Plan:

## 2017-08-26 NOTE — Telephone Encounter (Signed)
Addressed at visit

## 2017-08-26 NOTE — Telephone Encounter (Signed)
Daughter called [facility] last night stating that patients blood sugar yesterday morning was 249 and yesterday evening it was 348.  Daughter is concerned and would like a call back.

## 2017-08-27 DIAGNOSIS — K117 Disturbances of salivary secretion: Secondary | ICD-10-CM | POA: Insufficient documentation

## 2017-08-27 NOTE — Assessment & Plan Note (Signed)
Talked to them about the off label indication of glycopyrrolate and the potential serious side effects which generally limit long term usage. Rx for atropine opthalmic to use sublingal for drooling.

## 2017-08-27 NOTE — Assessment & Plan Note (Signed)
Referral to neuro rehab as she has had recent stroke. PT at home helped for some time but she is getting weak again on her right side.

## 2017-08-29 NOTE — Telephone Encounter (Signed)
Noted  

## 2017-08-30 ENCOUNTER — Telehealth: Payer: Self-pay

## 2017-08-30 NOTE — Telephone Encounter (Signed)
Pharmacy faxed stating manufacturer on back order for Atropine and requesting alternative. Please advise. Thank you

## 2017-08-30 NOTE — Telephone Encounter (Signed)
Atropine eye drops to go to:  [place], [place] [phone] (Phone) [phone] (Fax)

## 2017-08-30 NOTE — Telephone Encounter (Signed)
Copied from [place]. Topic: Quick Communication - Rx Refill/Question >> Aug 30, 2017  2:30 PM Neva Seat wrote: Eye Drops Rx  Please call into: [facility] [phone]  [facility] not [facility]

## 2017-08-30 NOTE — Telephone Encounter (Signed)
Called patient and informed of MD response. Patient states that she will call around to see who has it in stock and call back to let us know

## 2017-08-30 NOTE — Telephone Encounter (Signed)
There is no alternative. She can call around to different pharmacies to see if they have in stock.

## 2017-08-31 ENCOUNTER — Other Ambulatory Visit: Payer: Self-pay | Admitting: Internal Medicine

## 2017-08-31 MED ORDER — ATROPINE SULFATE 1 % OP SOLN: 2.0000 [drp] | Freq: Three times a day (TID) | OPHTHALMIC | 12 refills | Status: DC | PRN

## 2017-08-31 NOTE — Telephone Encounter (Signed)
Atropine sent to [facility]

## 2017-09-05 ENCOUNTER — Other Ambulatory Visit: Payer: Self-pay | Admitting: Internal Medicine

## 2017-09-13 ENCOUNTER — Telehealth: Payer: Self-pay | Admitting: Cardiology

## 2017-09-13 NOTE — Telephone Encounter (Signed)
We will respect her wishes.

## 2017-09-13 NOTE — Telephone Encounter (Signed)
Spoke w/ pt daughter and requested that she send a manual transmission b/c her home monitor has not updated in at least 14 days. Pt daughter stated that pt no longer wants to be billed for the loop recorder. Pt loop recorder was implanted 09-03-2014. Is it ok to discontinue remote monitoring?

## 2017-09-15 ENCOUNTER — Telehealth: Payer: Self-pay

## 2017-09-15 NOTE — Telephone Encounter (Signed)
Spoke with patient's daughter who confirmed address for return kit to be ordered. She states that they will not plan to have ILR explanted at this time. Home monitor ordered and successfully discontinued in [facility].

## 2017-09-20 ENCOUNTER — Telehealth: Payer: Self-pay | Admitting: Internal Medicine

## 2017-09-20 NOTE — Telephone Encounter (Signed)
We have not checked her blood work in [employer] and it is likely time for a visit to follow up on the diabetes.

## 2017-09-20 NOTE — Telephone Encounter (Signed)
Daughter states that patients blood sugar can not get dropped below 200.  States every now and then it will.  States patient has been eating better and exercising. States patient believes its contributed to the extra saliva that is produced in her gums.  Would like to know if medication needs to be changed any.

## 2017-09-21 ENCOUNTER — Other Ambulatory Visit: Payer: Self-pay | Admitting: Internal Medicine

## 2017-09-21 NOTE — Telephone Encounter (Signed)
Left message for patient to call back to schedule.  °

## 2017-09-21 NOTE — Telephone Encounter (Signed)
Can you please schedule patient a office visit. Thank you

## 2017-09-22 DIAGNOSIS — R69 Illness, unspecified: Secondary | ICD-10-CM | POA: Diagnosis not present

## 2017-10-04 LAB — CUP PACEART REMOTE DEVICE CHECK
Date Time Interrogation Session: 20190304010814
MDC IDC PG IMPLANT DT: 20160315

## 2017-10-13 ENCOUNTER — Other Ambulatory Visit: Payer: Self-pay

## 2017-10-13 ENCOUNTER — Telehealth: Payer: Self-pay | Admitting: Internal Medicine

## 2017-10-13 DIAGNOSIS — E1140 Type 2 diabetes mellitus with diabetic neuropathy, unspecified: Secondary | ICD-10-CM

## 2017-10-13 MED ORDER — GLIPIZIDE ER 2.5 MG PO TB24: 2.5000 mg | ORAL_TABLET | Freq: Two times a day (BID) | ORAL | 1 refills | Status: DC

## 2017-10-13 NOTE — Telephone Encounter (Signed)
Copied from [place] [id number]. Topic: Quick Communication - See Telephone Encounter >> Oct 13, 2017 10:04 AM Conception Chancy, NT wrote: CRM for notification. See Telephone encounter for: 10/13/17.  Patient is needing a refill on glipiZIDE (GLUCOTROL XL) 2.5 MG 24 hr tablet. She states the pharmacy denied it and would like to know why.   [id number] [address], [place] [address] Phone: [phone] Fax: [phone]

## 2017-10-13 NOTE — Telephone Encounter (Signed)
Do not know why but re-fill was sent

## 2017-10-18 ENCOUNTER — Encounter: Payer: Self-pay | Admitting: Internal Medicine

## 2017-10-18 NOTE — Progress Notes (Signed)
Abstracted and sent to scan  

## 2017-10-20 ENCOUNTER — Other Ambulatory Visit: Payer: Self-pay | Admitting: Internal Medicine

## 2017-10-20 DIAGNOSIS — E1140 Type 2 diabetes mellitus with diabetic neuropathy, unspecified: Secondary | ICD-10-CM

## 2017-10-20 NOTE — Telephone Encounter (Signed)
Should be seeing neurology for the seizures. Will fill 1 month but needs to make visit with neurology

## 2017-10-24 ENCOUNTER — Telehealth: Payer: Self-pay | Admitting: Internal Medicine

## 2017-10-24 NOTE — Telephone Encounter (Signed)
Spoke with patients daughter and explained that return kit had been ordered on 3/28. I offered tech services number for follow up with location of return kit but she declined. Her concern was that they would be charged for keeping the home monitor. I explained that charges came from the remote checks which had now been canceled so she should not receive any additional charges for keeping the home monitor in her possession until the return kit was delivered. Daughter verbalized understanding.

## 2017-10-24 NOTE — Telephone Encounter (Signed)
New message   Patient calling because she has not received information on returning monitor.  See 3/28 phone encounter  1. Has your device fired? NO  2. Is you device beeping? NO  3. Are you experiencing draining or swelling at device site? NO  4. Are you calling to see if we received your device transmission? NO  5. Have you passed out? NO    Please route to [place]

## 2017-10-24 NOTE — Telephone Encounter (Signed)
FYI Called patient back and let her know that Dr. Sharlet Salina wanted her to have a comprehensive work up after patients recent stroke and that included the PT, OT and speech therapy. Patient stated that at this time she feels this is too much and wants to just go ahead with the PT and if they tell her while she is there that she may need OT and speech therapy then she will go ahead with it. Patient states she is going to call and set up PT for now and if she needs anything else she will call us back

## 2017-10-24 NOTE — Telephone Encounter (Unsigned)
Copied from [place] [id number]. Topic: Quick Communication - See Telephone Encounter >> Oct 24, 2017  9:54 AM Percell Belt A wrote: CRM for notification. See Telephone encounter for: 10/24/17. Pt called and said that she wanted to know why DR crawford thought she needed OT, PT and speech therapy.  She said that she was under the impression it was just going to be PT???    Best number [phone]

## 2017-11-02 ENCOUNTER — Ambulatory Visit: Payer: Medicare HMO | Admitting: Podiatry

## 2017-11-02 ENCOUNTER — Encounter: Payer: Self-pay | Admitting: Podiatry

## 2017-11-02 DIAGNOSIS — B351 Tinea unguium: Secondary | ICD-10-CM

## 2017-11-02 DIAGNOSIS — M79675 Pain in left toe(s): Secondary | ICD-10-CM | POA: Diagnosis not present

## 2017-11-02 DIAGNOSIS — D689 Coagulation defect, unspecified: Secondary | ICD-10-CM | POA: Diagnosis not present

## 2017-11-02 DIAGNOSIS — E1159 Type 2 diabetes mellitus with other circulatory complications: Secondary | ICD-10-CM | POA: Diagnosis not present

## 2017-11-02 DIAGNOSIS — L608 Other nail disorders: Secondary | ICD-10-CM

## 2017-11-02 DIAGNOSIS — M79674 Pain in right toe(s): Secondary | ICD-10-CM

## 2017-11-02 NOTE — Progress Notes (Signed)
This patient presents to the office with chief complaint of long thick nails and diabetic feet.  This patient  says there  is  no pain and discomfort in her  feet.  This patient says there are long thick painful nails.  These nails are painful walking and wearing shoes.  Patient has no history of infection or drainage from both feet.  Patient is unable to  self treat his own nails . This patient presents  to the office today for treatment of the  long nails and a foot evaluation due to history of  diabetes.  General Appearance  Alert, conversant and in no acute stress.  Vascular  Dorsalis pedis and posterior tibial  pulses are weakly  palpable  bilaterally.  Capillary return is within normal limits  bilaterally. Temperature is within normal limits  bilaterally.  Neurologic  Senn-Weinstein monofilament wire test within normal limits  bilaterally. Muscle power within normal limits bilaterally.  Nails Thick disfigured discolored nails with subungual debris  from hallux to fifth toes bilaterally. No evidence of bacterial infection or drainage bilaterally. Pincer nails noted  B/L.  Orthopedic  No limitations of motion of motion feet .  No crepitus or effusions noted.  No bony pathology or digital deformities noted. DJD midfoot right foot.  Skin  normotropic skin with no porokeratosis noted bilaterally.  No signs of infections or ulcers noted.     Onychomycosis  Diabetes with diminished vascularity. complications  IE  Debride nails x 10.  A diabetic foot exam was performed and there is no evidence of any vascular or neurologic pathology.   RTC 3 months.   Gardiner Barefoot DPM

## 2017-11-09 DIAGNOSIS — R69 Illness, unspecified: Secondary | ICD-10-CM | POA: Diagnosis not present

## 2017-11-15 DIAGNOSIS — R69 Illness, unspecified: Secondary | ICD-10-CM | POA: Diagnosis not present

## 2017-12-10 ENCOUNTER — Other Ambulatory Visit: Payer: Self-pay | Admitting: Internal Medicine

## 2017-12-19 ENCOUNTER — Other Ambulatory Visit: Payer: Self-pay | Admitting: Internal Medicine

## 2017-12-19 DIAGNOSIS — I10 Essential (primary) hypertension: Secondary | ICD-10-CM

## 2017-12-19 DIAGNOSIS — I69398 Other sequelae of cerebral infarction: Secondary | ICD-10-CM

## 2017-12-19 DIAGNOSIS — R569 Unspecified convulsions: Secondary | ICD-10-CM

## 2017-12-21 ENCOUNTER — Encounter: Payer: Self-pay | Admitting: Internal Medicine

## 2017-12-27 ENCOUNTER — Other Ambulatory Visit: Payer: Self-pay | Admitting: Internal Medicine

## 2017-12-31 ENCOUNTER — Other Ambulatory Visit: Payer: Self-pay | Admitting: Internal Medicine

## 2018-01-03 ENCOUNTER — Other Ambulatory Visit: Payer: Self-pay | Admitting: Internal Medicine

## 2018-01-03 DIAGNOSIS — R69 Illness, unspecified: Secondary | ICD-10-CM | POA: Diagnosis not present

## 2018-01-31 ENCOUNTER — Other Ambulatory Visit: Payer: Self-pay | Admitting: Internal Medicine

## 2018-01-31 DIAGNOSIS — I10 Essential (primary) hypertension: Secondary | ICD-10-CM

## 2018-02-07 ENCOUNTER — Other Ambulatory Visit: Payer: Self-pay | Admitting: Internal Medicine

## 2018-02-07 DIAGNOSIS — E1140 Type 2 diabetes mellitus with diabetic neuropathy, unspecified: Secondary | ICD-10-CM

## 2018-02-14 ENCOUNTER — Other Ambulatory Visit: Payer: Self-pay | Admitting: Internal Medicine

## 2018-02-14 DIAGNOSIS — E1140 Type 2 diabetes mellitus with diabetic neuropathy, unspecified: Secondary | ICD-10-CM

## 2018-02-15 ENCOUNTER — Other Ambulatory Visit: Payer: Self-pay | Admitting: Internal Medicine

## 2018-02-15 DIAGNOSIS — E1140 Type 2 diabetes mellitus with diabetic neuropathy, unspecified: Secondary | ICD-10-CM

## 2018-02-18 DIAGNOSIS — R69 Illness, unspecified: Secondary | ICD-10-CM | POA: Diagnosis not present

## 2018-03-09 ENCOUNTER — Other Ambulatory Visit: Payer: Self-pay | Admitting: Internal Medicine

## 2018-03-09 DIAGNOSIS — E1140 Type 2 diabetes mellitus with diabetic neuropathy, unspecified: Secondary | ICD-10-CM

## 2018-03-13 ENCOUNTER — Other Ambulatory Visit: Payer: Self-pay | Admitting: Internal Medicine

## 2018-03-13 DIAGNOSIS — E1140 Type 2 diabetes mellitus with diabetic neuropathy, unspecified: Secondary | ICD-10-CM

## 2018-03-13 MED ORDER — SITAGLIPTIN PHOSPHATE 25 MG PO TABS: 25.0000 mg | ORAL_TABLET | Freq: Every day | ORAL | 0 refills | Status: DC

## 2018-03-13 NOTE — Telephone Encounter (Signed)
Januvia refill Last Refill:02/07/18 # 30 0refills Last OV: 02/03/17 PCP: Sharlet Salina [facility] [address] [place]  Last [facility] 01/14/17 Appt. Scheduled 03/23/18

## 2018-03-13 NOTE — Telephone Encounter (Signed)
Copied from [place] [phone]. Topic: Quick Communication - See Telephone Encounter >> Mar 13, 2018 11:39 AM Ivar Drape wrote: CRM for notification. See Telephone encounter for: 03/13/18. Patient made an appt for 03/23/18 but she will run out of her sitaGLIPtin (JANUVIA) 25 MG medication before than.  She wants to know if she can have a refill up until her appt and have it sent to [facility] [address].

## 2018-03-23 ENCOUNTER — Ambulatory Visit: Payer: Medicare HMO | Admitting: Internal Medicine

## 2018-03-27 ENCOUNTER — Other Ambulatory Visit: Payer: Self-pay | Admitting: Internal Medicine

## 2018-03-29 ENCOUNTER — Other Ambulatory Visit (INDEPENDENT_AMBULATORY_CARE_PROVIDER_SITE_OTHER): Payer: Medicare HMO

## 2018-03-29 ENCOUNTER — Ambulatory Visit (INDEPENDENT_AMBULATORY_CARE_PROVIDER_SITE_OTHER): Payer: Medicare HMO | Admitting: Internal Medicine

## 2018-03-29 ENCOUNTER — Encounter: Payer: Self-pay | Admitting: Internal Medicine

## 2018-03-29 VITALS — BP 120/78 | HR 75 | Temp 97.5°F | Ht 67.0 in | Wt 182.0 lb

## 2018-03-29 DIAGNOSIS — I10 Essential (primary) hypertension: Secondary | ICD-10-CM | POA: Diagnosis not present

## 2018-03-29 DIAGNOSIS — E1140 Type 2 diabetes mellitus with diabetic neuropathy, unspecified: Secondary | ICD-10-CM

## 2018-03-29 DIAGNOSIS — E1169 Type 2 diabetes mellitus with other specified complication: Secondary | ICD-10-CM | POA: Diagnosis not present

## 2018-03-29 DIAGNOSIS — E785 Hyperlipidemia, unspecified: Secondary | ICD-10-CM | POA: Diagnosis not present

## 2018-03-29 DIAGNOSIS — Z0000 Encounter for general adult medical examination without abnormal findings: Secondary | ICD-10-CM | POA: Diagnosis not present

## 2018-03-29 LAB — LIPID PANEL
CHOLESTEROL: 120 mg/dL (ref 0–200)
HDL: 33.1 mg/dL — ABNORMAL LOW (ref >39.00)
LDL Cholesterol: 64 mg/dL (ref 0–99)
NonHDL: 87.24
TRIGLYCERIDES: 115 mg/dL (ref 0.0–149.0)
Total CHOL/HDL Ratio: 4
VLDL: 23 mg/dL (ref 0.0–40.0)

## 2018-03-29 LAB — COMPREHENSIVE METABOLIC PANEL
ALBUMIN: 4.1 g/dL (ref 3.5–5.2)
ALT: 14 U/L (ref 0–35)
AST: 16 U/L (ref 0–37)
Alkaline Phosphatase: 103 U/L (ref 39–117)
BILIRUBIN TOTAL: 0.4 mg/dL (ref 0.2–1.2)
BUN: 22 mg/dL (ref 6–23)
CALCIUM: 9.5 mg/dL (ref 8.4–10.5)
CO2: 23 mEq/L (ref 19–32)
CREATININE: 1.38 mg/dL — AB (ref 0.40–1.20)
Chloride: 106 mEq/L (ref 96–112)
GFR: 47.5 mL/min — ABNORMAL LOW (ref >60.00)
Glucose, Bld: 156 mg/dL — ABNORMAL HIGH (ref 70–99)
Potassium: 4.7 mEq/L (ref 3.5–5.1)
SODIUM: 139 meq/L (ref 135–145)
Total Protein: 8.2 g/dL (ref 6.0–8.3)

## 2018-03-29 LAB — CBC
HCT: 43.8 % (ref 36.0–46.0)
Hemoglobin: 14.2 g/dL (ref 12.0–15.0)
MCHC: 32.4 g/dL (ref 30.0–36.0)
MCV: 86.5 fl (ref 78.0–100.0)
PLATELETS: 265 10*3/uL (ref 150.0–400.0)
RBC: 5.07 Mil/uL (ref 3.87–5.11)
RDW: 18.4 % — ABNORMAL HIGH (ref 11.5–15.5)
WBC: 4.9 10*3/uL (ref 4.0–10.5)

## 2018-03-29 LAB — HEMOGLOBIN A1C: HEMOGLOBIN A1C: 8.7 % — AB (ref 4.6–6.5)

## 2018-03-29 NOTE — Patient Instructions (Addendum)
We are checking the labs today and will call you back about the results.   We will have you see the sports medicine doctor to help the knee.    Diabetes Mellitus and Standards of Medical Care Managing diabetes (diabetes mellitus) can be complicated. Your diabetes treatment may be managed by a team of health care providers, including:  A diet and nutrition specialist (registered dietitian).  A nurse.  A certified diabetes educator (CDE).  A diabetes specialist (endocrinologist).  An eye doctor.  A primary care provider.  A dentist.  Your health care providers follow a schedule in order to help you get the best quality of care. The following schedule is a general guideline for your diabetes management plan. Your health care providers may also give you more specific instructions. HbA1c ( hemoglobin A1c) test This test provides information about blood sugar (glucose) control over the previous 2-3 months. It is used to check whether your diabetes management plan needs to be adjusted.  If you are meeting your treatment goals, this test is done at least 2 times a year.  If you are not meeting treatment goals or if your treatment goals have changed, this test is done 4 times a year.  Blood pressure test  This test is done at every routine medical visit. For most people, the goal is less than 130/80. Ask your health care provider what your goal blood pressure should be. Dental and eye exams  Visit your dentist two times a year.  If you have type 1 diabetes, get an eye exam 3-5 years after you are diagnosed, and then once a year after your first exam. ? If you were diagnosed with type 1 diabetes as a child, get an eye exam when you are age 25 or older and have had diabetes for 3-5 years. After the first exam, you should get an eye exam once a year.  If you have type 2 diabetes, have an eye exam as soon as you are diagnosed, and then once a year after your first exam. Foot care  exam  Visual foot exams are done at every routine medical visit. The exams check for cuts, bruises, redness, blisters, sores, or other problems with the feet.  A complete foot exam is done by your health care provider once a year. This exam includes an inspection of the structure and skin of your feet, and a check of the pulses and sensation in your feet. ? Type 1 diabetes: Get your first exam 3-5 years after diagnosis. ? Type 2 diabetes: Get your first exam as soon as you are diagnosed.  Check your feet every day for cuts, bruises, redness, blisters, or sores. If you have any of these or other problems that are not healing, contact your health care provider. Kidney function test ( urine microalbumin)  This test is done once a year. ? Type 1 diabetes: Get your first test 5 years after diagnosis. ? Type 2 diabetes: Get your first test as soon as you are diagnosed.  If you have chronic kidney disease (CKD), get a serum creatinine and estimated glomerular filtration rate (eGFR) test once a year. Lipid profile (cholesterol, HDL, LDL, triglycerides)  This test should be done when you are diagnosed with diabetes, and every 5 years after the first test. If you are on medicines to lower your cholesterol, you may need to get this test done every year. ? The goal for LDL is less than 100 mg/dL (5.5 mmol/L). If you are  at high risk, the goal is less than 70 mg/dL (3.9 mmol/L). ? The goal for HDL is 40 mg/dL (2.2 mmol/L) for men and 50 mg/dL(2.8 mmol/L) for women. An HDL cholesterol of 60 mg/dL (3.3 mmol/L) or higher gives some protection against heart disease. ? The goal for triglycerides is less than 150 mg/dL (8.3 mmol/L). Immunizations  The yearly flu (influenza) vaccine is recommended for everyone 6 months or older who has diabetes.  The pneumonia (pneumococcal) vaccine is recommended for everyone 2 years or older who has diabetes. If you are 52 or older, you may get the pneumonia vaccine as a  series of two separate shots.  The hepatitis B vaccine is recommended for adults shortly after they have been diagnosed with diabetes.  The Tdap (tetanus, diphtheria, and pertussis) vaccine should be given: ? According to normal childhood vaccination schedules, for children. ? Every 10 years, for adults who have diabetes.  The shingles vaccine is recommended for people who have had chicken pox and are 50 years or older. Mental and emotional health  Screening for symptoms of eating disorders, anxiety, and depression is recommended at the time of diagnosis and afterward as needed. If your screening shows that you have symptoms (you have a positive screening result), you may need further evaluation and be referred to a mental health care provider. Diabetes self-management education  Education about how to manage your diabetes is recommended at diagnosis and ongoing as needed. Treatment plan  Your treatment plan will be reviewed at every medical visit. Summary  Managing diabetes (diabetes mellitus) can be complicated. Your diabetes treatment may be managed by a team of health care providers.  Your health care providers follow a schedule in order to help you get the best quality of care.  Standards of care including having regular physical exams, blood tests, blood pressure monitoring, immunizations, screening tests, and education about how to manage your diabetes.  Your health care providers may also give you more specific instructions based on your individual health. This information is not intended to replace advice given to you by your health care provider. Make sure you discuss any questions you have with your health care provider. Document Released: 04/04/2009 Document Revised: 03/05/2016 Document Reviewed: 03/05/2016 Elsevier Interactive Patient Education  [employer].

## 2018-03-29 NOTE — Progress Notes (Signed)
   Subjective:    Patient ID: Kayla [name], female    DOB: 02/10/1941, 77 y.o.   MRN: [id number]  HPI The patient is a 77 YO female coming in for physical.   PMH, [facility], social history reviewed and updated.   Review of Systems  Constitutional: Negative.   HENT: Negative.   Eyes: Positive for visual disturbance.  Respiratory: Negative for cough, chest tightness and shortness of breath.   Cardiovascular: Negative for chest pain, palpitations and leg swelling.  Gastrointestinal: Negative for abdominal distention, abdominal pain, constipation, diarrhea, nausea and vomiting.  Musculoskeletal: Positive for arthralgias.  Skin: Negative.   Neurological: Positive for numbness.  Psychiatric/Behavioral: Negative.       Objective:   Physical Exam  Constitutional: She is oriented to person, place, and time. She appears well-developed and well-nourished.  HENT:  Head: Normocephalic and atraumatic.  Eyes: EOM are normal.  Neck: Normal range of motion.  Cardiovascular: Normal rate and regular rhythm.  Pulmonary/Chest: Effort normal and breath sounds normal. No respiratory distress. She has no wheezes. She has no rales.  Abdominal: Soft. Bowel sounds are normal. She exhibits no distension. There is no tenderness. There is no rebound.  Musculoskeletal: She exhibits no edema.  Neurological: She is alert and oriented to person, place, and time. A cranial nerve deficit is present. Coordination normal.  Skin: Skin is warm and dry.  Foot exam done  Psychiatric: She has a normal mood and affect.   Vitals:   03/29/18 0857  BP: 120/78  Pulse: 75  Temp: (!) 97.5 F (36.4 C)  TempSrc: Oral  SpO2: 99%  Weight: 182 lb (82.6 kg)  Height: 5\' 7"  (1.702 m)      Assessment & Plan:

## 2018-03-31 ENCOUNTER — Other Ambulatory Visit: Payer: Self-pay | Admitting: Internal Medicine

## 2018-03-31 DIAGNOSIS — Z0000 Encounter for general adult medical examination without abnormal findings: Secondary | ICD-10-CM | POA: Insufficient documentation

## 2018-03-31 MED ORDER — EMPAGLIFLOZIN 10 MG PO TABS: 10.0000 mg | ORAL_TABLET | Freq: Every day | ORAL | 0 refills | Status: DC

## 2018-03-31 NOTE — Assessment & Plan Note (Signed)
Foot exam done today, checking HgA1c and lipid panel. Previously at goal. Complicated by past stroke and neuropathy. Reminded about yearly eye exam. On statin and ACE-I.

## 2018-03-31 NOTE — Assessment & Plan Note (Signed)
BP at goal on lisinopril, hydralazine, imdur, atenolol. Checking CMP and adjust as needed.

## 2018-03-31 NOTE — Assessment & Plan Note (Signed)
Checking lipid panel and adjust lipitor 20 mg daily as needed. 

## 2018-03-31 NOTE — Assessment & Plan Note (Signed)
Flu shot declined. Pneumonia up to date. Shingrix counseled. Tetanus up to date. Colonoscopy counseled need as due. Mammogram up to date, pap smear aged out and dexa up to date. Counseled about sun safety and mole surveillance. Counseled about the dangers of distracted driving. Given 10 year screening recommendations.

## 2018-04-03 ENCOUNTER — Telehealth: Payer: Self-pay | Admitting: Internal Medicine

## 2018-04-03 NOTE — Telephone Encounter (Signed)
PA started on CoverMyMeds KEY: [id number]

## 2018-04-03 NOTE — Telephone Encounter (Signed)
Copied from [place] [id number]. Topic: Quick Communication - See Telephone Encounter >> Apr 03, 2018 10:54 AM Conception Chancy, NT wrote: CRM for notification. See Telephone encounter for: 04/03/18.  Patient daughter, Olin Hauser, is calling and states that she received a call on Friday 03/31/18 stating that the Januvia had been changed to [place] and she states it is more expensive and would like an alternative. Also would like to know A1C level. Can be reached [phone]

## 2018-04-03 NOTE — Telephone Encounter (Signed)
Called daughter back and informed her that there are two options that I can do a PA and try and get medication covered or she can call mothers insurance to see if there is an alternative that is covered by insurance. We are going to do both. Will start PA today

## 2018-04-07 ENCOUNTER — Ambulatory Visit: Payer: Self-pay

## 2018-04-07 ENCOUNTER — Ambulatory Visit (INDEPENDENT_AMBULATORY_CARE_PROVIDER_SITE_OTHER): Payer: Medicare HMO | Admitting: Family Medicine

## 2018-04-07 VITALS — BP 148/84 | HR 86 | Resp 16 | Wt 184.0 lb

## 2018-04-07 DIAGNOSIS — M25562 Pain in left knee: Secondary | ICD-10-CM | POA: Diagnosis not present

## 2018-04-07 DIAGNOSIS — M25561 Pain in right knee: Secondary | ICD-10-CM

## 2018-04-07 NOTE — Patient Instructions (Signed)
Good to see you  Take tylenol 650 mg three times a day is the best evidence based medicine we have for arthritis.  Glucosamine sulfate 750mg  twice a day is a supplement that has been shown to help moderate to severe arthritis. Vitamin D 2000 IU daily Fish oil 2 grams daily.  Tumeric 500mg  twice daily.  Capsaicin topically up to four times a day may also help with pain. Cortisone injections are an option if these interventions do not seem to make a difference or need more relief.  Please see me back in 3-4 weeks if your pain doesn't improve.

## 2018-04-07 NOTE — Telephone Encounter (Signed)
Noted  

## 2018-04-07 NOTE — Telephone Encounter (Signed)
Patient daughter. Olin Hauser is calling back. Lesly Rubenstein can you please  Contact  Her.   [phone]

## 2018-04-07 NOTE — Progress Notes (Signed)
Kayla [name] - 77 y.o. female MRN [id number]  Date of birth: 1940/12/21  SUBJECTIVE:  Including CC & ROS.  No chief complaint on file.   Kayla [name] is a 77 y.o. female that is presenting of bilateral knee pain.  The pain is intermittent in nature.  She feels the pain on the medial component of each knee.  She denies any mechanism of injury.  Has not had any treatments recently.  History of having injections in the past.  Denies any locking or giving way.  The left knee is worse than the right knee.  Denies any redness or fevers..   Review of Systems  Constitutional: Negative for fever.  HENT: Negative for congestion.   Respiratory: Negative for cough.   Cardiovascular: Negative for chest pain.  Gastrointestinal: Negative for abdominal pain.  Musculoskeletal: Positive for arthralgias and gait problem.  Skin: Negative for color change.  Neurological: Negative for weakness.  Hematological: Negative for adenopathy.  Psychiatric/Behavioral: Positive for confusion.    HISTORY: Past Medical, Surgical, Social, and Family History Reviewed & Updated per EMR.   Pertinent Historical Findings include:  Past Medical History:  Diagnosis Date  . Allergy    Shell fish  . Anxiety   . Arthritis    "knees, back, probably left hand" (01/14/2015)  . Asthmatic bronchitis   . Blind left eye   . Constipation   . Coronary artery disease   . Depression   . Diverticulosis of colon   . DJD (degenerative joint disease)   . Fibromyalgia   . GERD (gastroesophageal reflux disease)   . Heart murmur   . History of gout   . Hypercholesteremia   . Hypertension   . Personal history of noncompliance with medical treatment, presenting hazards to health   . Prolapse of vaginal vault after hysterectomy   . Proteinuria   . Sickle-cell trait ([place])   . Somatic dysfunction   . Stroke [facility]) ~ 08/2014   "blind in left eye; weak on left side since" (01/14/2015)  . Type II diabetes mellitus ([place])   . Venous  insufficiency     Past Surgical History:  Procedure Laterality Date  . ABDOMINAL HYSTERECTOMY    . CARDIAC CATHETERIZATION N/A 01/14/2015   Procedure: Left Heart Cath and Coronary Angiography;  Surgeon: Charolette Forward, MD;  Location: [place] CV LAB;  Service: Cardiovascular;  Laterality: N/A;  . CARDIAC CATHETERIZATION  ~ 2011  . CATARACT EXTRACTION W/ INTRAOCULAR LENS  IMPLANT, BILATERAL Bilateral 2012  . CORONARY ANGIOPLASTY    . DILATION AND CURETTAGE OF UTERUS  "probably"  . LOOP RECORDER IMPLANT N/A 09/03/2014   Procedure: LOOP RECORDER IMPLANT;  Surgeon: Thompson Grayer, MD;  Location: [facility] CATH LAB;  Service: Cardiovascular;  Laterality: N/A;  . ROBOTIC ASSISTED LAPAROSCOPIC SACROCOLPOPEXY  09/2009   Dr. Matilde Sprang  . TEE WITHOUT CARDIOVERSION N/A 09/02/2014   Procedure: TRANSESOPHAGEAL ECHOCARDIOGRAM (TEE);  Surgeon: Lelon Perla, MD;  Location: [facility] ENDOSCOPY;  Service: Cardiovascular;  Laterality: N/A;    Allergies  Allergen Reactions  . Amlodipine Shortness Of Breath and Rash  . Fish Allergy Hives  . Penicillins Other (See Comments)    REACTION: red rash  . Shellfish Allergy Hives  . Peanuts [Peanut Oil] Rash  . Tomato Rash    Family History  Problem Relation Age of Onset  . Prostate cancer Father   . Hypertension Father   . Seizures Sister   . Hypertension Sister   . Bell's palsy Son   .  Heart attack Neg Hx   . Stroke Neg Hx      Social History   Socioeconomic History  . Marital status: Married    Spouse name: Not on file  . Number of children: 4  . Years of education: 33  . Highest education level: Not on file  Occupational History  . Occupation: [employer]    Comment: retired  Scientific laboratory technician  . Financial resource strain: Not on file  . Food insecurity:    Worry: Not on file    Inability: Not on file  . Transportation needs:    Medical: Not on file    Non-medical: Not on file  Tobacco Use  . Smoking status: Never Smoker  . Smokeless tobacco:  Never Used  Substance and Sexual Activity  . Alcohol use: Yes    Alcohol/week: 0.0 standard drinks    Comment: 01/14/2015 "might have a glass of wine a few times/yr"  . Drug use: No  . Sexual activity: Never  Lifestyle  . Physical activity:    Days per week: Not on file    Minutes per session: Not on file  . Stress: Not on file  Relationships  . Social connections:    Talks on phone: Not on file    Gets together: Not on file    Attends religious service: Not on file    Active member of club or organization: Not on file    Attends meetings of clubs or organizations: Not on file    Relationship status: Not on file  . Intimate partner violence:    Fear of current or ex partner: Not on file    Emotionally abused: Not on file    Physically abused: Not on file    Forced sexual activity: Not on file  Other Topics Concern  . Not on file  Social History Narrative   Married   Right handed   Caffeine use - 1 cup coffee daily     PHYSICAL EXAM:  VS: BP (!) 148/84   Pulse 86   Resp 16   Wt 184 lb (83.5 kg)   SpO2 95%   BMI 28.82 kg/m  Physical Exam Gen: NAD, alert, cooperative with exam, well-appearing ENT: normal lips, normal nasal mucosa,  Eye: normal EOM, normal conjunctiva and lids CV:  no edema, +2 pedal pulses   Resp: no accessory muscle use, non-labored,  Skin: no rashes, no areas of induration  Neuro: normal tone, normal sensation to touch Psych:  normal insight, alert and oriented MSK:  Right and left knee:  Normal to inspection with no erythema or effusion or obvious bony abnormalities. Palpation normal with no warmth,  Mild medial joint line TTP b/l  ROM full in flexion and extension and lower leg rotation. Mild instability with valgus testing b/l  Negative Mcmurray's tests. Non painful patellar compression. Patellar and quadriceps tendons unremarkable. Hamstring and quadriceps strength is normal.  Neurovascularly intact     Limited ultrasound: right and  left knee:  Right knee:  No effusion  Normal appearing medial joint line   Left knee:  No effusion  Medial joint space narrowing with outpouching of medial meniscus  Summary: degenerative changes of left knee  Ultrasound and interpretation by Clearance Coots, MD   ASSESSMENT & PLAN:   Acute pain of both knees The right knee does not appear to be structurally compromised on ultrasound today.  The left knee does show degenerative changes.  Ultimately her pain is likely a result of degenerative  changes.  Likely has weakness that is contributing as well. -Samples of Pennsaid provided. -Counseled on home exercise therapy and supportive care. -If no improvement can consider injections or physical therapy.  Would obtain updated images of her knees.

## 2018-04-07 NOTE — Telephone Encounter (Signed)
Patient is calling to check on the status of this.   [phone]

## 2018-04-07 NOTE — Telephone Encounter (Signed)
Patient and patient daughter here now.  We have samples of jardiance ok to give? There is an alternative that the daughter stated began with a "T" but it is not any cheaper. Please Malen Gauze patients daughter want to talk to you today.

## 2018-04-07 NOTE — Telephone Encounter (Signed)
Yes patient is in the doughnut hole. I gave them enough samples for 42 days.They were informed about the farxiga and invokana and the patient assistance, daughter says she already had information on patient assistance and will contact the insurance company on Monday.

## 2018-04-07 NOTE — Telephone Encounter (Signed)
FYI LVM informing patient that the PA came back stating insurance covers medication. Last phone conversation I was told medication cost around 400 dollars. I have asked patient to check with insurance to see if there is an alternative that is not as expensive and to call us back if not it sounds like patient wants to get back on jardiance

## 2018-04-07 NOTE — Telephone Encounter (Signed)
Is she in doughnut hole? This can cause medications to be more expensive than usual even if they are covered and normally affordable. There is nothing we can do about this except recommend she go to website for that medication to see if they offer any income based assistance for medication. Okay to give sample today although does not solve the problem. I would not recommend switch back to [place] as it was not helping her sugars. The last Hga1c was 8.7 which is above goal. With her past stroke it is important for [place] to control sugars. She can check if invokana, farxiga are cheaper as they are in the same category as jardiance.

## 2018-04-09 DIAGNOSIS — M25561 Pain in right knee: Secondary | ICD-10-CM | POA: Insufficient documentation

## 2018-04-09 DIAGNOSIS — M25562 Pain in left knee: Principal | ICD-10-CM

## 2018-04-09 NOTE — Assessment & Plan Note (Addendum)
The right knee does not appear to be structurally compromised on ultrasound today.  The left knee does show degenerative changes.  Ultimately her pain is likely a result of degenerative changes.  Likely has weakness that is contributing as well. -Samples of Pennsaid provided. -Counseled on home exercise therapy and supportive care. -If no improvement can consider injections or physical therapy.  Would obtain updated images of her knees.

## 2018-04-21 ENCOUNTER — Other Ambulatory Visit: Payer: Self-pay | Admitting: Internal Medicine

## 2018-04-21 DIAGNOSIS — I10 Essential (primary) hypertension: Secondary | ICD-10-CM

## 2018-04-29 ENCOUNTER — Other Ambulatory Visit: Payer: Self-pay | Admitting: Internal Medicine

## 2018-04-29 DIAGNOSIS — E1140 Type 2 diabetes mellitus with diabetic neuropathy, unspecified: Secondary | ICD-10-CM

## 2018-05-03 ENCOUNTER — Other Ambulatory Visit: Payer: Self-pay | Admitting: Internal Medicine

## 2018-06-27 ENCOUNTER — Telehealth: Payer: Self-pay | Admitting: Internal Medicine

## 2018-06-27 NOTE — Telephone Encounter (Signed)
Copied from [place] [id number]. Topic: Quick Communication - Rx Refill/Question >> Jun 27, 2018  9:16 AM Scherrie Gerlach wrote: Medication: empagliflozin (JARDIANCE) 10 MG TABS tablet Pt wants to know if you have samples of this med? Pt states the pharmacy charging $300 for a 30 day supply and she cannot afford this.

## 2018-06-27 NOTE — Telephone Encounter (Signed)
Called and talked to patients daughter gave MD response and stated understanding. Daughter will call and see if those are better priced and get back with [place]

## 2018-06-27 NOTE — Telephone Encounter (Signed)
Invokana or Farxiga or Steglatro are all similar medications which may be covered better. She can call and find out price with insurance company. With her diabetes she needs stronger medicine and they are not generic which makes them more expensive. If she cannot afford we could consider insulin for her but this may not be cheaper.

## 2018-06-27 NOTE — Telephone Encounter (Signed)
Medication is ran through insurance and is still 300 dollars. Is there an alternative?

## 2018-07-03 ENCOUNTER — Other Ambulatory Visit: Payer: Self-pay | Admitting: Internal Medicine

## 2018-07-14 ENCOUNTER — Other Ambulatory Visit: Payer: Self-pay | Admitting: Internal Medicine

## 2018-07-14 DIAGNOSIS — I10 Essential (primary) hypertension: Secondary | ICD-10-CM

## 2018-07-14 DIAGNOSIS — R569 Unspecified convulsions: Secondary | ICD-10-CM

## 2018-07-14 DIAGNOSIS — I69398 Other sequelae of cerebral infarction: Secondary | ICD-10-CM

## 2018-08-31 ENCOUNTER — Other Ambulatory Visit: Payer: Self-pay | Admitting: Internal Medicine

## 2018-08-31 DIAGNOSIS — I10 Essential (primary) hypertension: Secondary | ICD-10-CM

## 2018-09-01 ENCOUNTER — Other Ambulatory Visit: Payer: Self-pay | Admitting: Internal Medicine

## 2018-09-24 ENCOUNTER — Other Ambulatory Visit: Payer: Self-pay | Admitting: Internal Medicine

## 2018-11-27 ENCOUNTER — Other Ambulatory Visit: Payer: Self-pay | Admitting: Internal Medicine

## 2018-11-27 DIAGNOSIS — E1140 Type 2 diabetes mellitus with diabetic neuropathy, unspecified: Secondary | ICD-10-CM

## 2018-12-08 ENCOUNTER — Other Ambulatory Visit: Payer: Self-pay

## 2018-12-08 NOTE — Patient Outreach (Signed)
  [facility]) Care Management Chronic Special Needs Program  12/08/2018  Name: Kayla [name] DOB: Jan 14, 1941  MRN: [id number]  Ms. Kayla [name] is enrolled in a chronic special needs plan for Diabetes. Chronic Care Management Coordinator telephoned client to review health risk assessment and to develop individualized care plan.  Introduced the chronic care management program, importance of client participation, and taking their care plan to all provider appointments and inpatient facilities.    Subjective: RNCM spoke with client. Two patient identifiers confirmed. Client reports history of diabetes, stroke, hypertension. Client gave permission for RNCM to speak with her daughter, Kayla [name] who reports that client has personal care assistance. Daughter also reports that she helps client with medication management. Client denies any issues or concerns with medication. Client is on greater than 8 medications. Daughter is agreeable to pharmacy referral for medication review. Neither daughter or client expressing any concerns at this time. Daughter expresses that client needs an appointment with primary care scheduled.   Goals Addressed            This Visit's Progress   .  Acknowledge receipt of Programme researcher, broadcasting/film/video      . Client understands the importance of follow-up with providers by attending scheduled visits      . Client will not report change from baseline and no repeated symptoms of stroke with in the next 4 months      . Client will report no fall or injuries in the next 4 months.      . Client will use Assistive Devices as needed and verbalize understanding of device use      . Client will verbalize knowledge of self management of Hypertension as evidences by BP reading of 140/90 or less; or as defined by provider      . Decrease inpatient diabetes admissions/readmissions with in the year      . HEMOGLOBIN A1C < 7.0       Diabetes self-management actions:  Glucose Monitoring per provider recommendations  Eat Healthy  Check feet daily  Visit provider every 3-6 months  Hemoglobin A1C level every 3-6 months  Eye exam yearly    . Maintain timely refills of diabetic medication as prescribed within the year .      [name] Obtain annual  Lipid Profile, LDL-C      . Obtain Annual Eye (retinal)  Exam       . Obtain Annual Foot Exam      . Obtain annual screen for micro albuminuria (urine) , nephropathy (kidney problems)      . Obtain Hemoglobin A1C at least 2 times per year      . Visit Primary Care Provider or Endocrinologist at least 2 times per year          Plan:  Send successful outreach letter with a copy of their individualized care plan, Send individual care plan to provider and Send educational material. Send advanced directive packet per client request. Chronic care management coordination will outreach in: 5 months. Will refer client to: [facility] for medication review.    Thea Silversmith, RN, MSN, [place] [phone]

## 2018-12-20 ENCOUNTER — Other Ambulatory Visit: Payer: Self-pay | Admitting: Internal Medicine

## 2018-12-29 ENCOUNTER — Encounter: Payer: Self-pay | Admitting: Internal Medicine

## 2018-12-29 ENCOUNTER — Other Ambulatory Visit (INDEPENDENT_AMBULATORY_CARE_PROVIDER_SITE_OTHER): Payer: HMO

## 2018-12-29 ENCOUNTER — Other Ambulatory Visit: Payer: Self-pay

## 2018-12-29 ENCOUNTER — Ambulatory Visit (INDEPENDENT_AMBULATORY_CARE_PROVIDER_SITE_OTHER): Payer: HMO | Admitting: Internal Medicine

## 2018-12-29 VITALS — BP 136/90 | HR 80 | Temp 98.7°F | Ht 67.0 in | Wt 180.0 lb

## 2018-12-29 DIAGNOSIS — F0150 Vascular dementia without behavioral disturbance: Secondary | ICD-10-CM

## 2018-12-29 DIAGNOSIS — I69398 Other sequelae of cerebral infarction: Secondary | ICD-10-CM

## 2018-12-29 DIAGNOSIS — M25562 Pain in left knee: Secondary | ICD-10-CM | POA: Diagnosis not present

## 2018-12-29 DIAGNOSIS — E1140 Type 2 diabetes mellitus with diabetic neuropathy, unspecified: Secondary | ICD-10-CM | POA: Diagnosis not present

## 2018-12-29 DIAGNOSIS — R413 Other amnesia: Secondary | ICD-10-CM

## 2018-12-29 DIAGNOSIS — M25561 Pain in right knee: Secondary | ICD-10-CM

## 2018-12-29 DIAGNOSIS — R569 Unspecified convulsions: Secondary | ICD-10-CM | POA: Diagnosis not present

## 2018-12-29 LAB — TSH: TSH: 1.58 u[IU]/mL (ref 0.35–4.50)

## 2018-12-29 LAB — CBC
HCT: 46 % (ref 36.0–46.0)
Hemoglobin: 14.6 g/dL (ref 12.0–15.0)
MCHC: 31.7 g/dL (ref 30.0–36.0)
MCV: 90.1 fl (ref 78.0–100.0)
Platelets: 239 10*3/uL (ref 150.0–400.0)
RBC: 5.1 Mil/uL (ref 3.87–5.11)
RDW: 18 % — ABNORMAL HIGH (ref 11.5–15.5)
WBC: 5 10*3/uL (ref 4.0–10.5)

## 2018-12-29 LAB — VITAMIN B12: Vitamin B-12: 1467 pg/mL — ABNORMAL HIGH (ref 211–911)

## 2018-12-29 LAB — COMPREHENSIVE METABOLIC PANEL
ALT: 15 U/L (ref 0–35)
AST: 12 U/L (ref 0–37)
Albumin: 4 g/dL (ref 3.5–5.2)
Alkaline Phosphatase: 84 U/L (ref 39–117)
BUN: 24 mg/dL — ABNORMAL HIGH (ref 6–23)
CO2: 24 mEq/L (ref 19–32)
Calcium: 8.8 mg/dL (ref 8.4–10.5)
Chloride: 107 mEq/L (ref 96–112)
Creatinine, Ser: 1.49 mg/dL — ABNORMAL HIGH (ref 0.40–1.20)
GFR: 40.82 mL/min — ABNORMAL LOW (ref >60.00)
Glucose, Bld: 298 mg/dL — ABNORMAL HIGH (ref 70–99)
Potassium: 4.2 mEq/L (ref 3.5–5.1)
Sodium: 140 mEq/L (ref 135–145)
Total Bilirubin: 0.4 mg/dL (ref 0.2–1.2)
Total Protein: 7.2 g/dL (ref 6.0–8.3)

## 2018-12-29 LAB — LIPID PANEL
Cholesterol: 106 mg/dL (ref 0–200)
HDL: 35.1 mg/dL — ABNORMAL LOW (ref >39.00)
LDL Cholesterol: 44 mg/dL (ref 0–99)
NonHDL: 70.87
Total CHOL/HDL Ratio: 3
Triglycerides: 136 mg/dL (ref 0.0–149.0)
VLDL: 27.2 mg/dL (ref 0.0–40.0)

## 2018-12-29 LAB — HEMOGLOBIN A1C: Hgb A1c MFr Bld: 7.7 % — ABNORMAL HIGH (ref 4.6–6.5)

## 2018-12-29 LAB — VITAMIN D 25 HYDROXY (VIT D DEFICIENCY, FRACTURES): VITD: 22.3 ng/mL — ABNORMAL LOW (ref 30.00–100.00)

## 2018-12-29 MED ORDER — DICLOFENAC SODIUM 1 % TD GEL: 4.0000 g | Freq: Four times a day (QID) | TRANSDERMAL | 3 refills | Status: DC

## 2018-12-29 NOTE — Progress Notes (Signed)
   Subjective:   Patient ID: Kayla [name], female    DOB: [birth year], 78 y.o.   MRN: [id number]  HPI The patient is a 78 YO female coming in for concerns about memory loss. Daughter is with her today to elaborate. She has definitely noticed changes over the years. She has had multiple strokes over the years and has had seizure due to old stroke and is currently controlled on medication. They have not noticed a huge change recently but just a little over the last year. There is some hostility or irritation when they ask her questions. Short term memory is poor and longer term is more intact. Denies problems with leaving stove on or wandering or behavior changes. Not recent stroke symptoms. Last MRI 2017 with encephalomalacia in multiple areas of the brain without acute stroke. She has had clinical stroke previously which symptoms resolved prior to them seeking care weeks previous so MRI was not done at that time. She has hypertension controlled and diabetes controlled. Prior home monitoring for embolic cause without A fib. Also having some extra muscular pain in knees and right side. Does remember straining right side. Knees with chronic arthritis.   PMH, [facility], social history reviewed and updated  Review of Systems  Constitutional: Negative.   HENT: Negative.   Eyes: Negative.   Respiratory: Negative for cough, chest tightness and shortness of breath.   Cardiovascular: Negative for chest pain, palpitations and leg swelling.  Gastrointestinal: Negative for abdominal distention, abdominal pain, constipation, diarrhea, nausea and vomiting.  Musculoskeletal: Positive for arthralgias and myalgias.  Skin: Negative.   Neurological: Negative.        Memory change  Psychiatric/Behavioral: Negative.     Objective:  Physical Exam Constitutional:      Appearance: She is well-developed.  HENT:     Head: Normocephalic and atraumatic.  Neck:     Musculoskeletal: Normal range of motion.   Cardiovascular:     Rate and Rhythm: Normal rate and regular rhythm.  Pulmonary:     Effort: Pulmonary effort is normal. No respiratory distress.     Breath sounds: Normal breath sounds. No wheezing or rales.  Abdominal:     General: Bowel sounds are normal. There is no distension.     Palpations: Abdomen is soft.     Tenderness: There is no abdominal tenderness. There is no rebound.  Musculoskeletal:        General: Tenderness present.  Skin:    General: Skin is warm and dry.  Neurological:     Mental Status: She is alert and oriented to person, place, and time.     Coordination: Coordination normal.     Comments: MMSE 22/30     Vitals:   12/29/18 1116  BP: 136/90  Pulse: 80  Temp: 98.7 F (37.1 C)  TempSrc: Oral  SpO2: 97%  Weight: 180 lb (81.6 kg)  Height: 5\' 7"  (1.702 m)   MMSE 22/30  Assessment & Plan:  Visit time 25 minutes: greater than 50% of that time was spent in face to face counseling and coordination of care with the patient: counseled about various etiologies of dementia and the likely cause in her situation as well as potential treatment and risk factor management for stroke

## 2018-12-29 NOTE — Patient Instructions (Signed)
Think about if you want to try a medicine for the memory.   We have sent in the gel medicine to use on the pain up to 4 times per day.   We are checking labs today.

## 2018-12-29 NOTE — Assessment & Plan Note (Signed)
Rx for voltaren gel

## 2018-12-29 NOTE — Assessment & Plan Note (Signed)
We talked about how this is likely the cause of her memory impairment given its relative stability and her history of strokes and changes on the MRI of the brain. Checking labs today to rule out other causes or worsening factor. Talked to them about potential of starting aricept to help prevent some aging of the brain and they decline at this time. BP at goal, checking HgA1c control. Advised of need for aggressive stroke prevention to prevent declines.

## 2018-12-29 NOTE — Assessment & Plan Note (Signed)
Checking labs today, adjust as needed.

## 2018-12-29 NOTE — Assessment & Plan Note (Signed)
Still controlled on her keppra.

## 2019-01-08 ENCOUNTER — Other Ambulatory Visit: Payer: Self-pay | Admitting: Internal Medicine

## 2019-01-08 DIAGNOSIS — I10 Essential (primary) hypertension: Secondary | ICD-10-CM

## 2019-01-08 DIAGNOSIS — R569 Unspecified convulsions: Secondary | ICD-10-CM

## 2019-01-08 DIAGNOSIS — I69398 Other sequelae of cerebral infarction: Secondary | ICD-10-CM

## 2019-01-25 ENCOUNTER — Telehealth: Payer: Self-pay | Admitting: Internal Medicine

## 2019-01-25 DIAGNOSIS — E1140 Type 2 diabetes mellitus with diabetic neuropathy, unspecified: Secondary | ICD-10-CM

## 2019-01-25 NOTE — Telephone Encounter (Signed)
Medication Refill - Medication:  blood glucose meter kit and supplies KIT  Has the patient contacted their pharmacy? Yes advised to call pcp. Patient states she lost her monitor.   Preferred Pharmacy (with phone number or street name):  [phone](Phone) [phone](Fax)   Agent: Please be advised that RX refills may take up to 3 business days. We ask that you follow-up with your pharmacy.

## 2019-01-26 MED ORDER — BLOOD GLUCOSE MONITOR KIT: PACK | 1 refills | Status: AC

## 2019-01-26 MED ORDER — GLUCOSE BLOOD VI STRP: ORAL_STRIP | 1 refills | Status: AC

## 2019-01-26 MED ORDER — LANCETS MISC: 1 refills | Status: DC

## 2019-01-26 NOTE — Telephone Encounter (Signed)
Sent in

## 2019-02-01 ENCOUNTER — Telehealth: Payer: Self-pay | Admitting: Internal Medicine

## 2019-02-01 NOTE — Telephone Encounter (Signed)
She can send [place] results so we can see what test exactly she is describing

## 2019-02-01 NOTE — Telephone Encounter (Signed)
fyi

## 2019-02-01 NOTE — Telephone Encounter (Signed)
LVM for patient informed of MD response, I did not have a number for mia

## 2019-02-01 NOTE — Telephone Encounter (Signed)
Copied from [place] [id number]. Topic: General - Other >> Feb 01, 2019  2:34 PM Rainey Pines A wrote: Maree [place] called to inform Dr. Sharlet Salina that pt was seen yesterday for house call visit and did a PAD screen and right leg was 1.02 and the left leg was 0.56

## 2019-02-19 ENCOUNTER — Encounter: Payer: Self-pay | Admitting: Internal Medicine

## 2019-02-19 DIAGNOSIS — N6321 Unspecified lump in the left breast, upper outer quadrant: Secondary | ICD-10-CM | POA: Diagnosis not present

## 2019-02-19 LAB — HM MAMMOGRAPHY

## 2019-02-20 ENCOUNTER — Telehealth: Payer: Self-pay

## 2019-02-20 NOTE — Telephone Encounter (Signed)
Son aware of MD response and verbalized understanding.

## 2019-02-20 NOTE — Progress Notes (Signed)
Abstracted and sent to scan  

## 2019-02-20 NOTE — Telephone Encounter (Signed)
Tried calling son back but not voicemail set up to inform him of MD response

## 2019-02-20 NOTE — Telephone Encounter (Signed)
Copied from [place] [id number]. Topic: General - Other >> Feb 20, 2019  8:43 AM Celene Kras A wrote: Reason for CRM: Pts son called and is requesting to know if pt is on any blood thinners before she has an ultrasound guided biopsy on 02/28/2019. Please advise.

## 2019-02-20 NOTE — Telephone Encounter (Signed)
Yes, she takes plavix daily which is a blood thinner.

## 2019-02-23 ENCOUNTER — Other Ambulatory Visit: Payer: Self-pay | Admitting: Internal Medicine

## 2019-02-23 DIAGNOSIS — I10 Essential (primary) hypertension: Secondary | ICD-10-CM

## 2019-02-28 ENCOUNTER — Other Ambulatory Visit: Payer: Self-pay | Admitting: Radiology

## 2019-02-28 DIAGNOSIS — N6321 Unspecified lump in the left breast, upper outer quadrant: Secondary | ICD-10-CM | POA: Diagnosis not present

## 2019-02-28 DIAGNOSIS — N62 Hypertrophy of breast: Secondary | ICD-10-CM | POA: Diagnosis not present

## 2019-03-13 ENCOUNTER — Encounter: Payer: Self-pay | Admitting: General Surgery

## 2019-03-13 DIAGNOSIS — H5440 Blindness, one eye, unspecified eye: Secondary | ICD-10-CM | POA: Diagnosis not present

## 2019-03-13 DIAGNOSIS — Z8673 Personal history of transient ischemic attack (TIA), and cerebral infarction without residual deficits: Secondary | ICD-10-CM | POA: Diagnosis not present

## 2019-03-13 DIAGNOSIS — Z9861 Coronary angioplasty status: Secondary | ICD-10-CM | POA: Diagnosis not present

## 2019-03-13 DIAGNOSIS — Z90710 Acquired absence of both cervix and uterus: Secondary | ICD-10-CM | POA: Diagnosis not present

## 2019-03-13 DIAGNOSIS — Z7901 Long term (current) use of anticoagulants: Secondary | ICD-10-CM | POA: Diagnosis not present

## 2019-03-13 DIAGNOSIS — E119 Type 2 diabetes mellitus without complications: Secondary | ICD-10-CM | POA: Diagnosis not present

## 2019-03-13 DIAGNOSIS — I2510 Atherosclerotic heart disease of native coronary artery without angina pectoris: Secondary | ICD-10-CM | POA: Diagnosis not present

## 2019-03-13 DIAGNOSIS — G40909 Epilepsy, unspecified, not intractable, without status epilepticus: Secondary | ICD-10-CM | POA: Diagnosis not present

## 2019-03-13 DIAGNOSIS — I10 Essential (primary) hypertension: Secondary | ICD-10-CM | POA: Diagnosis not present

## 2019-03-13 DIAGNOSIS — D242 Benign neoplasm of left breast: Secondary | ICD-10-CM | POA: Diagnosis not present

## 2019-03-26 ENCOUNTER — Other Ambulatory Visit: Payer: Self-pay | Admitting: Internal Medicine

## 2019-03-29 ENCOUNTER — Telehealth: Payer: Self-pay | Admitting: Internal Medicine

## 2019-03-29 DIAGNOSIS — E1140 Type 2 diabetes mellitus with diabetic neuropathy, unspecified: Secondary | ICD-10-CM

## 2019-03-29 NOTE — Telephone Encounter (Signed)
Pt's son is calling in to request a referral to a podiatrist and a cardiologist.   Also, pt son says that the medication JARDIANCE 10 MG TABS tablet is to expensive, they would like to know if there is something that is less expensive that pt could take?     He says that his sister use to take care of everything for mom (pt) but she has passed away so he will be assisting pt, he would like to  Know how often is mom seen? He's not sure of when her next apt need to be?    CB: [id number] Vicente Males - please advise.

## 2019-04-02 NOTE — Telephone Encounter (Signed)
Patient had a biopsy and they found something in right breast and wanted to get it removed but was denied because she needed to see a cardiologist first. Was seeing a cardiologist but new insurance he is not on her insurance and need a new referral. Son is not sure why the medication is so much was spending 135 on 30 day supply and was wanting to get three month and is now 300+ and does not know why. Told him to contact insurance and I would let you know what is going on.

## 2019-04-02 NOTE — Telephone Encounter (Signed)
We have placed referral to cardiology but if she is just needing surgical clearance for a procedure typically we can do this.

## 2019-04-02 NOTE — Telephone Encounter (Signed)
Referral to podiatry done, why is she needing cardiology referral? Is she in doughnut hole is that why jardiance is costing more? This is not a new medicine. Should have visit every 3-6 months for following her diabetes (could come in since last visit July now to avoid her having to come during flu season).

## 2019-04-02 NOTE — Addendum Note (Signed)
Addended by: Pricilla Holm A on: 04/02/2019 09:59 AM   Modules accepted: Orders

## 2019-04-09 ENCOUNTER — Ambulatory Visit: Payer: Self-pay

## 2019-04-10 ENCOUNTER — Ambulatory Visit: Payer: Self-pay

## 2019-04-10 ENCOUNTER — Other Ambulatory Visit: Payer: Self-pay | Admitting: Internal Medicine

## 2019-04-10 DIAGNOSIS — R569 Unspecified convulsions: Secondary | ICD-10-CM

## 2019-04-10 DIAGNOSIS — I69398 Other sequelae of cerebral infarction: Secondary | ICD-10-CM

## 2019-04-10 DIAGNOSIS — I10 Essential (primary) hypertension: Secondary | ICD-10-CM

## 2019-04-13 ENCOUNTER — Ambulatory Visit (INDEPENDENT_AMBULATORY_CARE_PROVIDER_SITE_OTHER): Payer: HMO | Admitting: Internal Medicine

## 2019-04-13 ENCOUNTER — Encounter: Payer: Self-pay | Admitting: Internal Medicine

## 2019-04-13 ENCOUNTER — Other Ambulatory Visit: Payer: Self-pay

## 2019-04-13 VITALS — BP 126/80 | HR 85 | Temp 98.5°F | Ht 67.0 in | Wt 180.0 lb

## 2019-04-13 DIAGNOSIS — E1140 Type 2 diabetes mellitus with diabetic neuropathy, unspecified: Secondary | ICD-10-CM | POA: Diagnosis not present

## 2019-04-13 LAB — POCT GLYCOSYLATED HEMOGLOBIN (HGB A1C): Hemoglobin A1C: 7.5 % — AB (ref 4.0–5.6)

## 2019-04-13 NOTE — Patient Instructions (Signed)
Your HgA1c is 7.5 which is great. We will see you back in 6 months.

## 2019-04-13 NOTE — Assessment & Plan Note (Signed)
Stable neuropathy and no new low sugars. Taking glipizide and jardiance. POC HgA1c done in office at goal 7.5 today. No adjustments. On ACE-I and statin.

## 2019-04-13 NOTE — Progress Notes (Signed)
   Subjective:   Patient ID: Kayla [name], female    DOB: [birth year], 78 y.o.   MRN: [id number]  HPI The patient is a 78 YO female coming in for follow up of diabetes. Wanted to come now before flu season in full swing. She has lost daughter recently to car accident and now her son is helping to care for her and help with meds. Denies low sugars. Denies new numbness in feet. Denies chest pains or SOB. Some constipation which is stable overall. Denies new concerns. Some questions about covid-19.  Review of Systems  Constitutional: Positive for activity change.  HENT: Negative.   Eyes: Negative.   Respiratory: Negative for cough, chest tightness and shortness of breath.   Cardiovascular: Negative for chest pain, palpitations and leg swelling.  Gastrointestinal: Negative for abdominal distention, abdominal pain, constipation, diarrhea, nausea and vomiting.  Musculoskeletal: Negative.   Skin: Negative.   Neurological: Negative.        Memory change  Psychiatric/Behavioral: Negative.     Objective:  Physical Exam Constitutional:      Appearance: She is well-developed.  HENT:     Head: Normocephalic and atraumatic.  Neck:     Musculoskeletal: Normal range of motion.  Cardiovascular:     Rate and Rhythm: Normal rate and regular rhythm.  Pulmonary:     Effort: Pulmonary effort is normal. No respiratory distress.     Breath sounds: Normal breath sounds. No wheezing or rales.  Abdominal:     General: Bowel sounds are normal. There is no distension.     Palpations: Abdomen is soft.     Tenderness: There is no abdominal tenderness. There is no rebound.  Skin:    General: Skin is warm and dry.  Neurological:     Mental Status: She is alert and oriented to person, place, and time. Mental status is at baseline.     Coordination: Coordination normal.     Vitals:   04/13/19 1342  BP: 126/80  Pulse: 85  Temp: 98.5 F (36.9 C)  TempSrc: Oral  SpO2: 93%  Weight: 180 lb (81.6 kg)   Height: 5\' 7"  (1.702 m)   POC HgA1c 7.5  Assessment & Plan:

## 2019-04-17 ENCOUNTER — Other Ambulatory Visit: Payer: Self-pay | Admitting: Internal Medicine

## 2019-05-01 ENCOUNTER — Other Ambulatory Visit: Payer: Self-pay | Admitting: Internal Medicine

## 2019-05-02 ENCOUNTER — Other Ambulatory Visit: Payer: Self-pay

## 2019-05-02 NOTE — Patient Outreach (Signed)
[place] [facility]) Care Management Chronic Special Needs Program  05/02/2019  Name: Kayla [name] DOB: Oct 22, 1940  MRN: [id number]  Ms. Kayla [name] is enrolled in a chronic special needs plan for Diabetes. Reviewed and updated care plan.  Subjective: RNCM spoke with client, two patient identifiers confirmed. Client reports her son Kayla [name] is managing her medications. She acknowledges some memory loss and states her son is managing medications and helping her to keep up with her appointments. Client reports unfortunate loss of her daughter to a car accident since last conversation. RNCM spoke with her son Kayla [name]. He reports her blood sugars are being checked every week; denies any blood sugars less than 70. Reports cost of Kayla [name] is $300 for a 90 day supply, but states if it is the best choice for client then she will have it. He reports her blood sugar ranges 120-130's. He is not clear what the "donut hole" or coverage gap mean. He is receptive to [facility] referral for assistance. Son reports he has taking over managing client's finances also and ask if there are any services that can educate/assist him with financial management. He is receptive to social work referral.  Goals Addressed            This Visit's Progress   .  Acknowledge receipt of Advanced Directive package   On track   . COMPLETED: Client understands the importance of follow-up with providers by attending scheduled visits       Voiced importance of attending provider visits.    . COMPLETED: Client will not report change from baseline and no repeated symptoms of stroke with in the next 4 months       Reports no change from baseline and not repeated symptoms.    . COMPLETED: Client will report no fall or injuries in the next 4 months.       Client Denies any falls.    . COMPLETED: Client will use Assistive Devices as needed and verbalize understanding of device use       Denies difficulty with use of  glucomter.    . COMPLETED: Client will verbalize knowledge of self management of Hypertension as evidences by BP reading of 140/90 or less; or as defined by provider       Takes medications as prescribed; attends provider visits, reports blood pressure less than 140/90    . COMPLETED: Decrease inpatient diabetes admissions/readmissions with in the year       No admissions this year.    . COMPLETED: HEMOGLOBIN A1C < 7       Per primary care at goal 7.5 Diabetes self-management actions:  Glucose Monitoring per provider recommendations  Eat Healthy  Check feet daily  Visit provider every 3-6 months  Hemoglobin A1C level every 3-6 months  Eye exam yearly    . Maintain timely refills of diabetic medication as prescribed within the year .   On track   . COMPLETED: Obtain annual  Lipid Profile, LDL-C       Done 12/29/2018    . Obtain Annual Eye (retinal)  Exam    On track   . Obtain Annual Foot Exam   On track   . Obtain annual screen for micro albuminuria (urine) , nephropathy (kidney problems)   On track   . COMPLETED: Obtain Hemoglobin A1C at least 2 times per year       Done 12/29/2018; 04/13/2019    . COMPLETED: Visit Primary Care Provider or Endocrinologist at least  2 times per year        Provider visit at least two times/year      Covid precautions reinforced. RNCM reinforced the availability of 24 hour nurse advice line; encouraged to contact the healthcare concierge regarding any benefits questions and encouraged client/son to contact RNCM as needed.  Plan: RNCM offered support; social referral for resources to help with financial/money management. Pharmacy referral for medication assistance. RNCM will send updated care plan to client. Send updated care plan to primary care.   Kayla Silversmith, RN, MSN, [place] [phone]      .

## 2019-05-02 NOTE — Addendum Note (Signed)
Addended by: Luretha Rued on: 05/02/2019 07:53 PM   Modules accepted: Orders

## 2019-05-07 ENCOUNTER — Other Ambulatory Visit: Payer: Self-pay | Admitting: Pharmacist

## 2019-05-07 NOTE — Patient Outreach (Signed)
[place] [facility]) Care Management  [place]   05/07/2019  Kayla [name] 1941-04-12 [id number]  Reason for referral: Medication Assistance, Medication Management  Referral source: [facility] RN Current insurance: [employer] C-SNP  PMHx includes but not limited to:  T2DM, HTN / HLD, fibromyalgia, OA, depression, hx CVA with seizure, vascular dementia  Outreach:  Successful telephone call with patient's son, Kayla [name].  HIPAA identifiers verified.   Subjective:  Kayla [name] with his mother, Kayla [name], at her home.  He reports he has started filling a weekly pillbox for patient and that she is usually very adherent with medications.  He is concerned about the cost of Jardiance as patient is now in the coverage gap.     Objective: The ASCVD Risk score Mikey Bussing DC Jr., et al., 2013) failed to calculate for the following reasons:   The patient has a prior MI or stroke diagnosis  Lab Results  Component Value Date   CREATININE 1.49 (H) 12/29/2018   CREATININE 1.38 (H) 03/29/2018   CREATININE 1.48 (H) 02/03/2017    Lab Results  Component Value Date   HGBA1C 7.5 (A) 04/13/2019    Lipid Panel     Component Value Date/Time   CHOL 106 12/29/2018 1207   TRIG 136.0 12/29/2018 1207   HDL 35.10 (L) 12/29/2018 1207   CHOLHDL 3 12/29/2018 1207   VLDL 27.2 12/29/2018 1207   LDLCALC 44 12/29/2018 1207    BP Readings from Last 3 Encounters:  04/13/19 126/80  12/29/18 136/90  04/07/18 (!) 148/84    Allergies  Allergen Reactions  . Amlodipine Shortness Of Breath and Rash  . Fish Allergy Hives  . Penicillins Other (See Comments)    REACTION: red rash  . Shellfish Allergy Hives  . Peanuts [Peanut Oil] Rash  . Tomato Rash    Medications Reviewed Today    Reviewed by Luretha Rued, RN (Registered Nurse) on 05/02/19 at 1104  Med List Status: <None>  Medication Order Taking? Sig Documenting Provider Last Dose Status Informant  atenolol (TENORMIN) 25 MG tablet  [id number] Yes TAKE 0.5 TABLETS (12.5 MG TOTAL) BY MOUTH 2 (TWO) TIMES DAILY AFTER A MEAL. Hoyt Koch, MD Taking Active   atorvastatin (LIPITOR) 20 MG tablet [id number] Yes Take 1 tablet (20 mg total) by mouth daily at 6 PM. Hoyt Koch, MD Taking Active Child  atropine 1 % ophthalmic solution [id number] No Place 2 drops under the tongue 3 (three) times daily as needed (drooling).  Patient not taking: Reported on 05/02/2019   Hoyt Koch, MD Not Taking Active   blood glucose meter kit and supplies KIT [id number]  Dispense what is covered by patients insurance with strips and lancets. Use twice a day (before breakfast and at bedtime). E11.40 Hoyt Koch, MD  Active   cetirizine (ZYRTEC) 10 MG tablet [id number] Yes Take 10 mg by mouth daily. [provider] Taking Active Child  clopidogrel (PLAVIX) 75 MG tablet [id number] Yes Take 1 tablet (75 mg total) by mouth daily. Hoyt Koch, MD Taking Active   [facility] VITAMIN B12 1000 MCG tablet [id number] Yes TAKE 1 TABLET (1,000 MCG TOTAL) BY MOUTH DAILY. Hoyt Koch, MD Taking Active   diclofenac sodium (VOLTAREN) 1 % GEL [id number] No Apply 4 g topically 4 (four) times daily.  Patient not taking: Reported on 04/13/2019   Hoyt Koch, MD Not Taking Active   glipiZIDE (GLUCOTROL XL) 2.5 MG 24 hr tablet [id number] Yes TAKE 1 TABLET (2.5  MG TOTAL) BY MOUTH 2 (TWO) TIMES DAILY BETWEEN MEALS. Hoyt Koch, MD Taking Active   glucose blood test strip [id number]  Use to check blood sugars twice daily Hoyt Koch, MD  Active   hydrALAZINE (APRESOLINE) 50 MG tablet [id number]  TAKE 1 TABLET (50 MG TOTAL) BY MOUTH 4 (FOUR) TIMES DAILY. Hoyt Koch, MD  Expired 04/25/19 2359   isosorbide mononitrate (IMDUR) 60 MG 24 hr tablet [id number] Yes Take 60 mg by mouth daily.  [provider] Taking Active Child           Med Note Izola Price, [place] R   Mon Dec 08, 2015  9:33 AM)  Received from: External Pharmacy  JARDIANCE 10 MG TABS tablet [id number] Yes TAKE 1 TABLET BY MOUTH EVERY DAY Hoyt Koch, MD Taking Active   Lancets [place] [id number]  Used to check blood sugars twice a day Hoyt Koch, MD  Active   levETIRAcetam (KEPPRA XR) 500 MG 24 hr tablet [id number] Yes TAKE 1 TABLET BY MOUTH DAILY. ANNUAL APPT DUE OCT MUST SEE PROVIDER FOR FUTURE REFILLS Hoyt Koch, MD Taking Active   lisinopril (ZESTRIL) 20 MG tablet [id number] Yes TAKE 1 TABLET BY MOUTH EVERY DAY Hoyt Koch, MD Taking Active   nitroGLYCERIN (NITROSTAT) 0.4 MG SL tablet [id number] Yes Place 0.4 mg under the tongue every 5 (five) minutes as needed for chest pain. [provider] Taking Active Child  ONE TOUCH ULTRA TEST test strip [id number]  USE TO TEST TWICE DAILY (BEFORE BREAKFAST, BEDTIME) E11.4 Hoyt Koch, MD  Active   polyethylene glycol [facility] / GLYCOLAX) packet [id number] Yes Take 17 g by mouth daily as needed for moderate constipation. [provider] Taking Active Child          Assessment: Drugs sorted by system:  Neurologic/Psychologic: levetiracetam  Hematologic: clopidogrel  Cardiovascular: atenolol, atorvastatin, hydralazine, isosorbide mononitrate, lisinopril, SL NTG  Pulmonary/Allergy: cetirizine  Gastrointestinal: polyethylene glycol  Endocrine:glipizide, empagliflozin  Vitamins/Minerals/Supplements:vitamin B12   Medication Review Findings:  . CrCl ~39 ml/min / eGFR 41 ml/min.  Appears SCr at baseline.  o Jardiance PI recommends discontinuing use when eGFR < 45 ml/min.  o Glipizide XL 2.85m BID.  Usual dosing for XL is once daily rather than BID.  PI also recommends very conservative dosing due to risk for hypoglycemia.  With remain impairment, max recommended dose is glipizide XL 2.589monce daily.    . Atropine for drooling - no longer taking however son questions whether patient should resume as this is  still a concern.   . Hydralazine:  Only taking BID rather than QID as listed on medication list.     Medication Assistance Findings:  Medication assistance needs identified: Jardiance  Extra Help:  Not eligible for Extra Help Low Income Subsidy based on reported income and assets  Patient Assistance Programs: Jardiance made by BoMatinecockequirement met: Yes o Out-of-pocket prescription expenditure met:   Not Applicable - Patient has met application requirements to apply for this program.  - Reviewed program requirements with patient.    Plan: . Will route note to PCP regarding medication findings listed above.  If empagliflozin continued, will assist with patient assistance program application process.   CoRalene BathePharmD, [phone]

## 2019-05-08 ENCOUNTER — Other Ambulatory Visit: Payer: Self-pay

## 2019-05-08 NOTE — Patient Outreach (Signed)
[place] [facility]) Care Management  05/08/2019  SURIYA BALDY [birth year] [id number]   Social work referral received from [employer], Thea Silversmith.  "C-SNP client has acknowledged some memory loss. She recently lost her daughter in a car accident. Her daughter was assisting her with medical needs/appointment. Now client's son has taken over. He would like to know if there are resources to assist him or educate him on financial management. They may also have questions regarding completion of advanced directive" Unsuccessful outreach to patient today.  Voicemail box not set up on mobile number.  No answer or option to leave message on home number.  Mailed unsuccessful outreach letter.  Will attempt to reach again within four business days.  Ronn Melena, BSW Social Worker [phone]

## 2019-05-10 ENCOUNTER — Other Ambulatory Visit: Payer: Self-pay

## 2019-05-10 ENCOUNTER — Other Ambulatory Visit: Payer: Self-pay | Admitting: Pharmacist

## 2019-05-10 NOTE — Patient Outreach (Signed)
[facility] [facility]) Care Management  05/10/2019  Kayla [name] May 12, 1941 [id number]   Social work referral received from [employer], Thea Silversmith.  "C-SNP client has acknowledged some memory loss. She recently lost her daughter in a car accident. Her daughter was assisting her with medical needs/appointment. Now client's son has taken over. He would like to know if there are resources to assist him or educate him on financial management. They may also have questions regarding completion of advanced directive" Successful outreach to patient's son, Kayla [name], today.  Son stated that the transition to managing finances and other needs for patient has been challenging.  Talked with son about services provided by [facility], including [facility], and encouraged him to call to schedule an appointment with representative.   Son inquired about transportation resources.  As of right now, patient's spouse is still driving and transporting her to appointments.  Informed son about [employer] services and how to go about applying once family is in need of resource. Son reports that he and brother have been working on Financial trader for patient and spouse.  Denied need for further assistance at this time but said that he would call if any assistance is needed. Closing social work case.  Ronn Melena, BSW Social Worker [phone]

## 2019-05-10 NOTE — Patient Outreach (Signed)
[facility] [facility]) Care Management  [place] 05/10/2019  VICIE BRAUTIGAN 05/28/1941 [id number]  Communication received from PCP Dr. Sharlet Salina that patient and son should call office to clarify hydralazine dose as dose was recently confirmed to be QID at recent o/v however son now giving it to patient BID.    Unsuccessful call to patient and son today to relay message.  Unable to leave message as no voicemail set up on either phone number listed in [facility].    Plan: Will outreach patient and son again tomorrow.    Ralene Bathe, PharmD, [place] [phone]

## 2019-05-10 NOTE — Progress Notes (Signed)
Cardiology Office Note    Date:  05/11/2019   ID:  CHERIKA JESSIE, DOB 1941-01-27, MRN [id number]  PCP:  Hoyt Koch, MD  Cardiologist:  Fransico Him, MD   Chief Complaint  Patient presents with  . New Patient (Initial Visit)    preop cardiac clearance    History of Present Illness:  Kayla [name] is a 78 y.o. female who is being seen today for the evaluation of chest pain and preop cardiac clearance at the request of Hoyt Koch, *.  This is a 78yo AAF with a hx of ASCAD with cath in 2016 showing 40% ostial RCA, 40% ostial D1, 90% proximal D1, 85% and 70% sequential distal LAD stenosis and underwent PCI with stents in the D1 and LAD.  She has done well since then but not followed up with Cardiology.  She also has a hx of GERD, HLD, HTN, DM2 and sickle cell trait as well as medical noncompliance.    Her son is with her today and she is referred for preop cardiac clearance for breast surgery. She was recently dx with breast CA but did not want to proceed with surgery.  The plan was to reevaluate in 6 months and then decide in regards to surgery. She tells me that she has had some episodes of chest discomfort.  They are not severe and there is no associated Nausea, diaphoresis but will get SOB with them.  She has had some DOE as well.   She denies any PND, orthopnea, LE edema, dizziness, palpitations or syncope. She is compliant with her meds and is tolerating meds with no SE.    Past Medical History:  Diagnosis Date  . Allergy    Shell fish  . Anxiety   . Arthritis    "knees, back, probably left hand" (01/14/2015)  . Asthmatic bronchitis   . Blind left eye   . CAD (coronary artery disease)    cath in 2016 showing 40% ostial RCA, 40% ostial D1, 90% proximal D1, 85% and 70% sequential distal LAD stenosis and underwent PCI with stents in the D1 and LAD.  [name] Constipation   . Depression   . Diverticulosis of colon   . DJD (degenerative joint disease)   .  Fibromyalgia   . GERD (gastroesophageal reflux disease)   . Heart murmur   . History of gout   . Hypercholesteremia   . Hypertension   . Personal history of noncompliance with medical treatment, presenting hazards to health   . Prolapse of vaginal vault after hysterectomy   . Proteinuria   . Sickle-cell trait ([place])   . Somatic dysfunction   . Stroke [facility]) ~ 08/2014   "blind in left eye; weak on left side since" (01/14/2015)  . Type II diabetes mellitus ([place])   . Venous insufficiency     Past Surgical History:  Procedure Laterality Date  . ABDOMINAL HYSTERECTOMY    . CARDIAC CATHETERIZATION N/A 01/14/2015   Procedure: Left Heart Cath and Coronary Angiography;  Surgeon: Charolette Forward, MD;  Location: [facility] CV LAB;  Service: Cardiovascular;  Laterality: N/A;  . CARDIAC CATHETERIZATION  ~ 2011  . CATARACT EXTRACTION W/ INTRAOCULAR LENS  IMPLANT, BILATERAL Bilateral 2012  . CORONARY ANGIOPLASTY    . DILATION AND CURETTAGE OF UTERUS  "probably"  . LOOP RECORDER IMPLANT N/A 09/03/2014   Procedure: LOOP RECORDER IMPLANT;  Surgeon: Thompson Grayer, MD;  Location: [facility] CATH LAB;  Service: Cardiovascular;  Laterality: N/A;  .  ROBOTIC ASSISTED LAPAROSCOPIC SACROCOLPOPEXY  09/2009   Dr. Matilde Sprang  . TEE WITHOUT CARDIOVERSION N/A 09/02/2014   Procedure: TRANSESOPHAGEAL ECHOCARDIOGRAM (TEE);  Surgeon: Lelon Perla, MD;  Location: [facility] ENDOSCOPY;  Service: Cardiovascular;  Laterality: N/A;    Current Medications: Current Meds  Medication Sig  . atenolol (TENORMIN) 25 MG tablet TAKE 0.5 TABLETS (12.5 MG TOTAL) BY MOUTH 2 (TWO) TIMES DAILY AFTER A MEAL.  [name] atorvastatin (LIPITOR) 20 MG tablet Take 1 tablet (20 mg total) by mouth daily at 6 PM.  . blood glucose meter kit and supplies KIT Dispense what is covered by patients insurance with strips and lancets. Use twice a day (before breakfast and at bedtime). E11.40  . cetirizine (ZYRTEC) 10 MG tablet Take 10 mg by mouth daily.  . clopidogrel  (PLAVIX) 75 MG tablet Take 1 tablet (75 mg total) by mouth daily.  . Cyanocobalamin (VITAMIN B-12) 2500 MCG SUBL Place 2,500 mcg under the tongue daily.  [name] glipiZIDE (GLUCOTROL XL) 2.5 MG 24 hr tablet Take 2.5 mg by mouth 2 (two) times daily.  [name] glucose blood test strip Use to check blood sugars twice daily  . hydrALAZINE (APRESOLINE) 50 MG tablet Take 50 mg by mouth 2 (two) times daily.  . isosorbide mononitrate (IMDUR) 60 MG 24 hr tablet Take 60 mg by mouth daily.   [name] JARDIANCE 10 MG TABS tablet TAKE 1 TABLET BY MOUTH EVERY DAY  . Lancets MISC Used to check blood sugars twice a day  . levETIRAcetam (KEPPRA XR) 500 MG 24 hr tablet TAKE 1 TABLET BY MOUTH DAILY. ANNUAL APPT DUE OCT MUST SEE PROVIDER FOR FUTURE REFILLS  . lisinopril (ZESTRIL) 20 MG tablet TAKE 1 TABLET BY MOUTH EVERY DAY  . nitroGLYCERIN (NITROSTAT) 0.4 MG SL tablet Place 0.4 mg under the tongue every 5 (five) minutes as needed for chest pain.  . ONE TOUCH ULTRA TEST test strip USE TO TEST TWICE DAILY (BEFORE BREAKFAST, BEDTIME) E11.4  . polyethylene glycol (MIRALAX / GLYCOLAX) packet Take 17 g by mouth daily as needed for moderate constipation.    Allergies:   Amlodipine, Fish allergy, Penicillins, Shellfish allergy, Peanuts [peanut oil], and Tomato   Social History   Socioeconomic History  . Marital status: Married    Spouse name: Not on file  . Number of children: 4  . Years of education: 30  . Highest education level: Not on file  Occupational History  . Occupation: [employer]    Comment: retired  Scientific laboratory technician  . Financial resource strain: Not very hard  . Food insecurity    Worry: Never true    Inability: Never true  . Transportation needs    Medical: No    Non-medical: No  Tobacco Use  . Smoking status: Never Smoker  . Smokeless tobacco: Never Used  Substance and Sexual Activity  . Alcohol use: Yes    Alcohol/week: 0.0 standard drinks    Comment: 01/14/2015 "might have a glass of wine a few times/yr"  .  Drug use: No  . Sexual activity: Never  Lifestyle  . Physical activity    Days per week: Not on file    Minutes per session: Not on file  . Stress: Not on file  Relationships  . Social Herbalist on phone: Not on file    Gets together: Not on file    Attends religious service: Not on file    Active member of club or organization: Not on file  Attends meetings of clubs or organizations: Not on file    Relationship status: Not on file  Other Topics Concern  . Not on file  Social History Narrative   Married   Right handed   Caffeine use - 1 cup coffee daily     Family History:  The patient's family history includes Bell's palsy in her son; Hypertension in her father and sister; Prostate cancer in her father; Seizures in her sister.   ROS:   Please see the history of present illness.    ROS All other systems reviewed and are negative.  No flowsheet data found.     PHYSICAL EXAM:   VS:  BP 128/84   Pulse 75   Ht 5' 6.5" (1.689 m)   Wt 179 lb 1.9 oz (81.2 kg)   SpO2 97%   BMI 28.48 kg/m    GEN: Well nourished, well developed, in no acute distress  HEENT: normal  Neck: no JVD, carotid bruits, or masses Cardiac: RRR; no murmurs, rubs, or gallops,no edema.  Intact distal pulses bilaterally.  Respiratory:  clear to auscultation bilaterally, normal work of breathing GI: soft, nontender, nondistended, + BS MS: no deformity or atrophy  Skin: warm and dry, no rash Neuro:  Alert and Oriented x 3, Strength and sensation are intact Psych: euthymic mood, full affect  Wt Readings from Last 3 Encounters:  05/11/19 179 lb 1.9 oz (81.2 kg)  04/13/19 180 lb (81.6 kg)  12/29/18 180 lb (81.6 kg)      Studies/Labs Reviewed:    Recent Labs: 12/29/2018: ALT 15; BUN 24; Creatinine, Ser 1.49; Hemoglobin 14.6; Platelets 239.0; Potassium 4.2; Sodium 140; TSH 1.58   Lipid Panel    Component Value Date/Time   CHOL 106 12/29/2018 1207   TRIG 136.0 12/29/2018 1207   HDL  35.10 (L) 12/29/2018 1207   CHOLHDL 3 12/29/2018 1207   VLDL 27.2 12/29/2018 1207   LDLCALC 44 12/29/2018 1207    Additional studies/ records that were reviewed today include:  Office notes from PCP    ASSESSMENT:    1. Precordial pain   2. Coronary artery disease due to lipid rich plaque   3. Essential hypertension   4. Pure hypercholesterolemia   5. DM type 2, goal HbA1c < 7% (HCC)      PLAN:  In order of problems listed above:  1.  Chest Pain -she has been having CP with exertion and SOB worrisome for possible exertional angina -recommend Lexiscan myoview to rule out ischemia  2.  ASCAD -cath in 2016 showing 40% ostial RCA, 40% ostial D1, 90% proximal D1, 85% and 70% sequential distal LAD stenosis and underwent PCI with stents in the D1 and LAD. -she is now having some CP with exertion that is concerning for angina -I will get a Lexiscan myoview to rule out ischemia -continue Plavix 47m daily, statin, BB and long acting nitrate  3.  HTN -Bp controlled on exam -continue Atenolol 12.516mdaily, Hydralazine 5056mID, Lisinopril 20m61mily  4.  HLD -LDL goal < 70 -LDL was 44 in July -continue atorvastatin 20mg10mly  5.  Type 2 DM -followed by PCP -HbA1C 7.5% last month  Medication Adjustments/Labs and Tests Ordered: Current medicines are reviewed at length with the patient today.  Concerns regarding medicines are outlined above.  Medication changes, Labs and Tests ordered today are listed in the Patient Instructions below.  Patient Instructions  Medication Instructions:  Your physician recommends that you continue on your current  medications as directed. Please refer to the Current Medication list given to you today.  *If you need a refill on your cardiac medications before your next appointment, please call your pharmacy*  Testing/Procedures: Your physician has requested that you have a lexiscan myoview. For further information please visit  [email or web address]. Please follow instruction sheet, as given.   Follow-Up: At [facility], you and your health needs are our priority.  As part of our continuing mission to provide you with exceptional heart care, we have created designated Provider Care Teams.  These Care Teams include your primary Cardiologist (physician) and Advanced Practice Providers (APPs -  Physician Assistants and Nurse Practitioners) who all work together to provide you with the care you need, when you need it.  Your next appointment:   1 year  The format for your next appointment:   In Person  Provider:   You may see Fransico Him, MD or one of the following Advanced Practice Providers on your designated Care Team:    Melina Copa, PA-C  Ermalinda Barrios, PA-C      Signed, Fransico Him, MD  05/11/2019 3:51 PM    [address], [place], [place]  [address] Phone: [phone]; Fax: [phone]

## 2019-05-11 ENCOUNTER — Ambulatory Visit: Payer: HMO | Admitting: Cardiology

## 2019-05-11 ENCOUNTER — Encounter: Payer: Self-pay | Admitting: Cardiology

## 2019-05-11 ENCOUNTER — Other Ambulatory Visit: Payer: Self-pay

## 2019-05-11 ENCOUNTER — Ambulatory Visit: Payer: Self-pay | Admitting: Pharmacist

## 2019-05-11 ENCOUNTER — Other Ambulatory Visit: Payer: Self-pay | Admitting: Pharmacist

## 2019-05-11 ENCOUNTER — Other Ambulatory Visit: Payer: Self-pay | Admitting: Internal Medicine

## 2019-05-11 VITALS — BP 128/84 | HR 75 | Ht 66.5 in | Wt 179.1 lb

## 2019-05-11 DIAGNOSIS — I10 Essential (primary) hypertension: Secondary | ICD-10-CM

## 2019-05-11 DIAGNOSIS — I2510 Atherosclerotic heart disease of native coronary artery without angina pectoris: Secondary | ICD-10-CM

## 2019-05-11 DIAGNOSIS — E119 Type 2 diabetes mellitus without complications: Secondary | ICD-10-CM

## 2019-05-11 DIAGNOSIS — R072 Precordial pain: Secondary | ICD-10-CM | POA: Diagnosis not present

## 2019-05-11 DIAGNOSIS — I2583 Coronary atherosclerosis due to lipid rich plaque: Secondary | ICD-10-CM

## 2019-05-11 DIAGNOSIS — E7800 Pure hypercholesterolemia, unspecified: Secondary | ICD-10-CM | POA: Diagnosis not present

## 2019-05-11 NOTE — Telephone Encounter (Signed)
Please see encounter below

## 2019-05-11 NOTE — Telephone Encounter (Signed)
Pts son called stating he was told to call in and confirm that  Pt is only taking the hydralazine 2x a day and that she is doing great with it. Pts son is also requesting to have a medication attropine (helps with pts drooling) and nitroglycerin sent to pharmacy. Please advise.    [id number] [address], [place] [address]  Phone: [phone] Fax: [phone]  Not a 24 hour pharmacy; exact hours not known.

## 2019-05-11 NOTE — Telephone Encounter (Signed)
Forwarding medication refill requests to PCP for review. 

## 2019-05-11 NOTE — Patient Outreach (Addendum)
[place] [facility]) Care Management  [place] 05/11/2019  Kayla [name] 07/31/1940 [id number]  2nd call attempt to patient and son regarding hydralazine.  Successful call with Kayla [name], son.  Relayed message to contact PCP office to discuss hydralazine dosing.  Kayla [name] reports he will call office now for recommendations as he he has been giving BID due to blood pressure being at goal when he checks .  He also will ask about atropine for drooling as this previously was on medication list.   Plan: I will route patient assistance letter to [place] technician who will coordinate patient assistance program application process for Jardiance.  [facility] pharmacy technician will assist with obtaining all required documents from both patient and provider(s) and submit application(s) once completed.    Ralene Bathe, PharmD, [place] [phone]

## 2019-05-11 NOTE — Patient Instructions (Signed)
Medication Instructions:  Your physician recommends that you continue on your current medications as directed. Please refer to the Current Medication list given to you today.  *If you need a refill on your cardiac medications before your next appointment, please call your pharmacy*  Testing/Procedures: Your physician has requested that you have a lexiscan myoview. For further information please visit [email or web address]. Please follow instruction sheet, as given.   Follow-Up: At [facility], you and your health needs are our priority.  As part of our continuing mission to provide you with exceptional heart care, we have created designated Provider Care Teams.  These Care Teams include your primary Cardiologist (physician) and Advanced Practice Providers (APPs -  Physician Assistants and Nurse Practitioners) who all work together to provide you with the care you need, when you need it.  Your next appointment:   1 year  The format for your next appointment:   In Person  Provider:   You may see Fransico Him, MD or one of the following Advanced Practice Providers on your designated Care Team:    Melina Copa, PA-C  Ermalinda Barrios, PA-C

## 2019-05-14 ENCOUNTER — Other Ambulatory Visit: Payer: Self-pay | Admitting: Pharmacy Technician

## 2019-05-14 MED ORDER — HYDRALAZINE HCL 50 MG PO TABS: 50.0000 mg | ORAL_TABLET | Freq: Two times a day (BID) | ORAL | 1 refills | Status: DC

## 2019-05-14 NOTE — Telephone Encounter (Signed)
Can you please schedule patient for a virtual or telephone visit to discuss the atropine. Thank you

## 2019-05-14 NOTE — Telephone Encounter (Signed)
Nitroglycerin should be prescribed by her cardiologist so that they can know how often she is or isn't using this. The hydralazine has been refilled. The atropine drops were prescribed back in 2019 and I do not know if they ever took them or if effective. Can have virtual or phone visit to discuss if these are appropriate.

## 2019-05-14 NOTE — Telephone Encounter (Signed)
Appointment scheduled.

## 2019-05-14 NOTE — Patient Outreach (Signed)
[facility]) Care Management  05/14/2019  TOCARA MUSSER [birth year] [id number]                                       Medication Assistance Referral  Referral From: [facility] Waynard Reeds  Medication/Company: [name] / [employer] Patient application portion:  Mailed Provider application portion: Faxed  to Dr. Lesly Rubenstein Provider address/fax verified via: Office website    Follow up:  Will follow up with patient in 14-21 business days to confirm application(s) have been received.  [name] [name] [place] Certified Pharmacy Technician [place] Management Direct Dial:[phone]

## 2019-05-15 ENCOUNTER — Encounter: Payer: Self-pay | Admitting: Internal Medicine

## 2019-05-15 ENCOUNTER — Telehealth (HOSPITAL_COMMUNITY): Payer: Self-pay | Admitting: *Deleted

## 2019-05-15 ENCOUNTER — Ambulatory Visit (INDEPENDENT_AMBULATORY_CARE_PROVIDER_SITE_OTHER): Payer: HMO | Admitting: Internal Medicine

## 2019-05-15 DIAGNOSIS — K117 Disturbances of salivary secretion: Secondary | ICD-10-CM

## 2019-05-15 MED ORDER — ATROPINE SULFATE 1 % OP SOLN: 2.0000 [drp] | Freq: Three times a day (TID) | OPHTHALMIC | 2 refills | Status: DC

## 2019-05-15 NOTE — Telephone Encounter (Signed)
Attempted to reach patient regarding upcoming appointment- no answer, unable to leave message.  Kayla [name]

## 2019-05-15 NOTE — Progress Notes (Signed)
Virtual Visit via Audio Note  I connected with Kayla [name] on 05/15/19 at  8:40 AM EST by an audio-only enabled telemedicine application and verified that I am speaking with the correct person using two identifiers.  The patient and the provider were at separate locations throughout the entire encounter.   I discussed the limitations of evaluation and management by telemedicine and the availability of in person appointments. The patient expressed understanding and agreed to proceed. The patient and the provider were the only parties present for the visit unless noted in HPI below.  History of Present Illness: The patient is a 78 y.o. female with visit for drooling. Started years ago and we had prescribed atropine drops to use for this. It is unclear if she ever used these. She cannot actually remember if she tried them or not. Has excessive drooling since stroke. Feels that this is worse recently due to some tooth extractions. Denies problems with gagging on saliva or choking on it. Overall it is worsening.   Observations/Objective: Voice strong, no coughing or dyspnea during visit, A and O times 3  Assessment and Plan: See problem oriented charting  Follow Up Instructions: atropine drops 2 drops TID prn drooling to see if this is effective  Visit time 12 minutes: that time was spent in non-face to face counseling and coordination of care with the patient: counseled about as above   I discussed the assessment and treatment plan with the patient. The patient was provided an opportunity to ask questions and all were answered. The patient agreed with the plan and demonstrated an understanding of the instructions.   The patient was advised to call back or seek an in-person evaluation if the symptoms worsen or if the condition fails to improve as anticipated.  Hoyt Koch, MD

## 2019-05-15 NOTE — Assessment & Plan Note (Signed)
Rx atropine drops for only short supply. Last time this was prescribed it is unclear if she ever tried. Advised that these go sublingual TID 2 drops. She is to let us know if these work.

## 2019-05-21 ENCOUNTER — Ambulatory Visit: Payer: Self-pay

## 2019-05-22 ENCOUNTER — Telehealth (HOSPITAL_COMMUNITY): Payer: Self-pay | Admitting: *Deleted

## 2019-05-22 NOTE — Telephone Encounter (Signed)
Attempted to reach patient regarding upcoming appointment- no answer.  Kayla [name]  

## 2019-05-23 ENCOUNTER — Other Ambulatory Visit: Payer: Self-pay

## 2019-05-23 ENCOUNTER — Ambulatory Visit (HOSPITAL_COMMUNITY): Payer: HMO | Attending: Internal Medicine

## 2019-05-23 DIAGNOSIS — R072 Precordial pain: Secondary | ICD-10-CM

## 2019-05-23 MED ORDER — TECHNETIUM TC 99M TETROFOSMIN IV KIT
31.7000 | PACK | Freq: Once | INTRAVENOUS | Status: AC | PRN
  Administered 2019-05-23: 10:00:00 31.7 via INTRAVENOUS
  Filled 2019-05-23: qty 32

## 2019-05-24 ENCOUNTER — Ambulatory Visit (HOSPITAL_COMMUNITY): Payer: HMO | Attending: Cardiology

## 2019-05-24 DIAGNOSIS — R072 Precordial pain: Secondary | ICD-10-CM | POA: Insufficient documentation

## 2019-05-24 LAB — MYOCARDIAL PERFUSION IMAGING
LV dias vol: 48 mL (ref 46–106)
LV sys vol: 12 mL
Peak HR: 102 {beats}/min
Rest HR: 75 {beats}/min
SDS: 0
SRS: 2
SSS: 2
TID: 1.09

## 2019-05-24 MED ORDER — TECHNETIUM TC 99M TETROFOSMIN IV KIT
32.5000 | PACK | Freq: Once | INTRAVENOUS | Status: AC | PRN
  Administered 2019-05-24: 32.5 via INTRAVENOUS
  Filled 2019-05-24: qty 33

## 2019-05-24 MED ORDER — REGADENOSON 0.4 MG/5ML IV SOLN
0.4000 mg | Freq: Once | INTRAVENOUS | Status: AC
  Administered 2019-05-24: 0.4 mg via INTRAVENOUS

## 2019-05-25 ENCOUNTER — Ambulatory Visit (HOSPITAL_COMMUNITY): Payer: HMO

## 2019-05-25 ENCOUNTER — Telehealth: Payer: Self-pay

## 2019-05-25 NOTE — Telephone Encounter (Signed)
-----   Message from Sueanne Margarita, MD sent at 05/25/2019  7:38 AM EST ----- Please let patient know that stress test was fine

## 2019-05-25 NOTE — Telephone Encounter (Signed)
Pt states that she is no longer experiencing any chest pain.   Pt is aware to call the office if any new symptoms present.   The patient has been notified of the result and verbalized understanding.  All questions (if any) were answered. Wilma Flavin, RN 05/25/2019 8:44 AM

## 2019-05-31 ENCOUNTER — Other Ambulatory Visit: Payer: Self-pay | Admitting: Pharmacy Technician

## 2019-05-31 NOTE — Patient Outreach (Signed)
[facility] [facility] Care Management  05/31/2019  Kayla [name] May 06, 1941 [id number]   Received provider portion(s) of patient assistance application(s) for Jardiance. Faxed completed application and required documents into [employer].  Will follow up with company(ies) in 7-10 business days to check status of application(s).  [name] [name] [place] Certified Pharmacy Technician [place] Management Direct [phone]

## 2019-06-07 ENCOUNTER — Other Ambulatory Visit: Payer: Self-pay | Admitting: Internal Medicine

## 2019-06-07 DIAGNOSIS — E1140 Type 2 diabetes mellitus with diabetic neuropathy, unspecified: Secondary | ICD-10-CM

## 2019-06-11 ENCOUNTER — Other Ambulatory Visit: Payer: Self-pay | Admitting: Pharmacy Technician

## 2019-06-11 NOTE — Patient Outreach (Signed)
[place] [facility]) Care Management  06/11/2019  [name] February 12, 1941 [id number]    Follow up call placed to [place] regarding patient assistance application(s) for [name] confirms patient has been approved as of 12/11 until 06/20/2020!!!Medication delivered to patients home on 12/19 @ 7:02am.  Follow up:  Will follow up with patient to confirm medication was delivered in 2-3 business days.  [name] [name] [place] Certified Pharmacy Technician [place] Management Direct Dial:[phone]

## 2019-06-12 ENCOUNTER — Other Ambulatory Visit: Payer: Self-pay | Admitting: Internal Medicine

## 2019-07-11 ENCOUNTER — Other Ambulatory Visit: Payer: Self-pay | Admitting: Internal Medicine

## 2019-07-13 ENCOUNTER — Other Ambulatory Visit: Payer: Self-pay | Admitting: Pharmacy Technician

## 2019-07-13 NOTE — Patient Outreach (Addendum)
[place] [facility]) Care Management  07/13/2019  BRIONNA BRUGH 25-Oct-1940 [id number]    Unsuccessful call placed to patient regarding patient assistance medication delivery of Jardiance, left HIPAA compliant message with gentleman that answered the phone.  Follow up:  Will make additional call attempt in 3-5 business days of call has not been returned.  [name] [name] [place] Certified Pharmacy Technician [place] Management Direct Dial:[phone]    ADDENDUM 1:54pm  Return call from Mrs. Raine, HIPAA identifiers verified. Mrs. Ketterman requested that I speak to her son. He was able to confirm that received a 90 supply of Jardiance from [facility]. Reviewed with him how to obtain refills and requested they contact me with any issues or questions.  Will route note to [place] for case closure.  [name] [name] [place] Certified Pharmacy Technician [place] Management Direct Dial:[phone]

## 2019-08-07 ENCOUNTER — Telehealth: Payer: Self-pay | Admitting: Emergency Medicine

## 2019-08-07 NOTE — Telephone Encounter (Signed)
Pts son Derreck called and stated patient needs a refill on her atropine 1 % ophthalmic solution. He stated that the pharmacy does not carry the 1oz bottle and wants to know if it can be changed to the 4oz or 5oz bottle. [facility] [place] [facility]

## 2019-08-08 NOTE — Telephone Encounter (Signed)
Called pt, LVM.   

## 2019-08-08 NOTE — Telephone Encounter (Signed)
I would recommend to see if they can order or call to other local pharmacies to see if they can get the 1 oz bottle.

## 2019-08-15 ENCOUNTER — Other Ambulatory Visit: Payer: Self-pay | Admitting: General Surgery

## 2019-08-15 DIAGNOSIS — D242 Benign neoplasm of left breast: Secondary | ICD-10-CM

## 2019-08-22 ENCOUNTER — Other Ambulatory Visit: Payer: Self-pay

## 2019-08-22 NOTE — Patient Outreach (Signed)
  [place] [facility]) Care Management Chronic Special Needs Program    08/22/2019  Name: Kayla [name], DOB: [birth year]  MRN: [id number]   Client has dis-enrolled from the [employer] C-SNP plan, therefore RNCM is no longer able to follow as client's chronic care management coordinator.    Plan: Close case.  Thea Silversmith, RN, MSN, [place] [phone]

## 2019-08-24 ENCOUNTER — Other Ambulatory Visit: Payer: Self-pay | Admitting: Pharmacist

## 2019-08-24 NOTE — Patient Outreach (Signed)
[place] [facility]) Care Management  [place]   08/24/2019  Kayla [name] 1940-08-25 [id number]  Patient no longer enrolled in H[employer] C-SNP program.  Will close [place] case at this time.   Ralene Bathe, PharmD, [place] [phone]

## 2019-08-30 ENCOUNTER — Ambulatory Visit: Payer: HMO

## 2019-09-05 ENCOUNTER — Other Ambulatory Visit: Payer: Self-pay | Admitting: Internal Medicine

## 2019-09-05 DIAGNOSIS — I10 Essential (primary) hypertension: Secondary | ICD-10-CM

## 2019-10-01 ENCOUNTER — Other Ambulatory Visit: Payer: Self-pay | Admitting: Internal Medicine

## 2019-10-01 DIAGNOSIS — R569 Unspecified convulsions: Secondary | ICD-10-CM

## 2019-10-01 DIAGNOSIS — I69398 Other sequelae of cerebral infarction: Secondary | ICD-10-CM

## 2019-10-01 DIAGNOSIS — I10 Essential (primary) hypertension: Secondary | ICD-10-CM

## 2019-10-04 DIAGNOSIS — D242 Benign neoplasm of left breast: Secondary | ICD-10-CM | POA: Diagnosis not present

## 2019-10-31 ENCOUNTER — Other Ambulatory Visit: Payer: Self-pay | Admitting: Internal Medicine

## 2019-11-05 ENCOUNTER — Ambulatory Visit: Payer: HMO | Admitting: Pharmacist

## 2019-11-13 DIAGNOSIS — D510 Vitamin B12 deficiency anemia due to intrinsic factor deficiency: Secondary | ICD-10-CM | POA: Diagnosis not present

## 2019-11-13 DIAGNOSIS — E1142 Type 2 diabetes mellitus with diabetic polyneuropathy: Secondary | ICD-10-CM | POA: Diagnosis not present

## 2019-11-13 DIAGNOSIS — I25118 Atherosclerotic heart disease of native coronary artery with other forms of angina pectoris: Secondary | ICD-10-CM | POA: Diagnosis not present

## 2019-11-13 DIAGNOSIS — I10 Essential (primary) hypertension: Secondary | ICD-10-CM | POA: Diagnosis not present

## 2019-11-18 ENCOUNTER — Other Ambulatory Visit: Payer: Self-pay | Admitting: Internal Medicine

## 2019-12-11 ENCOUNTER — Other Ambulatory Visit: Payer: Self-pay | Admitting: Internal Medicine

## 2019-12-11 DIAGNOSIS — E1140 Type 2 diabetes mellitus with diabetic neuropathy, unspecified: Secondary | ICD-10-CM

## 2020-01-07 ENCOUNTER — Other Ambulatory Visit: Payer: Self-pay | Admitting: Student

## 2020-01-07 DIAGNOSIS — Z780 Asymptomatic menopausal state: Secondary | ICD-10-CM

## 2020-01-08 ENCOUNTER — Other Ambulatory Visit: Payer: Self-pay

## 2020-01-08 ENCOUNTER — Ambulatory Visit
Admission: RE | Admit: 2020-01-08 | Discharge: 2020-01-08 | Disposition: A | Discharge status: Home / Self Care | Payer: Medicare Other | Source: Ambulatory Visit | Attending: Student | Admitting: Student

## 2020-01-08 DIAGNOSIS — Z780 Asymptomatic menopausal state: Secondary | ICD-10-CM

## 2020-01-17 ENCOUNTER — Other Ambulatory Visit: Payer: Self-pay | Admitting: Internal Medicine

## 2020-02-06 ENCOUNTER — Other Ambulatory Visit: Payer: Self-pay | Admitting: Internal Medicine

## 2020-02-11 ENCOUNTER — Other Ambulatory Visit: Payer: HMO

## 2020-02-12 ENCOUNTER — Other Ambulatory Visit: Payer: Self-pay | Admitting: Internal Medicine

## 2020-03-04 ENCOUNTER — Other Ambulatory Visit: Payer: Self-pay | Admitting: Internal Medicine

## 2020-03-04 DIAGNOSIS — I10 Essential (primary) hypertension: Secondary | ICD-10-CM

## 2020-03-30 ENCOUNTER — Other Ambulatory Visit: Payer: Self-pay | Admitting: Internal Medicine

## 2020-04-07 ENCOUNTER — Other Ambulatory Visit: Payer: Self-pay | Admitting: Internal Medicine

## 2020-04-07 DIAGNOSIS — I10 Essential (primary) hypertension: Secondary | ICD-10-CM

## 2020-04-09 ENCOUNTER — Other Ambulatory Visit: Payer: Self-pay | Admitting: Internal Medicine

## 2020-04-11 ENCOUNTER — Other Ambulatory Visit: Payer: Self-pay | Admitting: Internal Medicine

## 2020-06-08 ENCOUNTER — Other Ambulatory Visit: Payer: Self-pay | Admitting: Internal Medicine

## 2020-06-08 DIAGNOSIS — E1140 Type 2 diabetes mellitus with diabetic neuropathy, unspecified: Secondary | ICD-10-CM

## 2020-06-20 ENCOUNTER — Other Ambulatory Visit: Payer: Self-pay | Admitting: Internal Medicine

## 2020-06-29 ENCOUNTER — Other Ambulatory Visit: Payer: Self-pay | Admitting: Internal Medicine

## 2020-06-29 DIAGNOSIS — R569 Unspecified convulsions: Secondary | ICD-10-CM

## 2020-06-29 DIAGNOSIS — I69398 Other sequelae of cerebral infarction: Secondary | ICD-10-CM

## 2020-06-29 DIAGNOSIS — I10 Essential (primary) hypertension: Secondary | ICD-10-CM

## 2020-07-01 ENCOUNTER — Other Ambulatory Visit: Payer: Self-pay | Admitting: Internal Medicine

## 2020-07-01 DIAGNOSIS — E1140 Type 2 diabetes mellitus with diabetic neuropathy, unspecified: Secondary | ICD-10-CM

## 2020-07-06 ENCOUNTER — Other Ambulatory Visit: Payer: Self-pay | Admitting: Internal Medicine

## 2020-07-08 ENCOUNTER — Other Ambulatory Visit: Payer: Self-pay | Admitting: Internal Medicine

## 2020-07-15 ENCOUNTER — Other Ambulatory Visit: Payer: Self-pay | Admitting: Internal Medicine

## 2020-07-31 ENCOUNTER — Other Ambulatory Visit: Payer: Self-pay | Admitting: Internal Medicine

## 2020-07-31 DIAGNOSIS — E1140 Type 2 diabetes mellitus with diabetic neuropathy, unspecified: Secondary | ICD-10-CM

## 2020-08-23 ENCOUNTER — Other Ambulatory Visit: Payer: Self-pay | Admitting: Internal Medicine

## 2020-08-23 DIAGNOSIS — E1140 Type 2 diabetes mellitus with diabetic neuropathy, unspecified: Secondary | ICD-10-CM

## 2020-09-01 ENCOUNTER — Other Ambulatory Visit: Payer: Self-pay | Admitting: Internal Medicine

## 2020-12-12 ENCOUNTER — Other Ambulatory Visit: Payer: Self-pay | Admitting: Internal Medicine

## 2021-02-25 ENCOUNTER — Other Ambulatory Visit: Payer: Self-pay

## 2021-04-14 ENCOUNTER — Other Ambulatory Visit: Payer: Self-pay | Admitting: Internal Medicine

## 2021-04-14 DIAGNOSIS — E1140 Type 2 diabetes mellitus with diabetic neuropathy, unspecified: Secondary | ICD-10-CM

## 2021-04-29 ENCOUNTER — Emergency Department (HOSPITAL_COMMUNITY): Payer: Medicare (Managed Care)

## 2021-04-29 ENCOUNTER — Inpatient Hospital Stay (HOSPITAL_COMMUNITY)
Admission: EM | Admit: 2021-04-29 | Discharge: 2021-05-01 | DRG: 308 | Disposition: A | Discharge status: Home Health | Payer: Medicare (Managed Care) | Attending: Family Medicine | Admitting: Family Medicine

## 2021-04-29 DIAGNOSIS — M797 Fibromyalgia: Secondary | ICD-10-CM | POA: Diagnosis present

## 2021-04-29 DIAGNOSIS — U071 COVID-19: Secondary | ICD-10-CM

## 2021-04-29 DIAGNOSIS — E785 Hyperlipidemia, unspecified: Secondary | ICD-10-CM | POA: Diagnosis present

## 2021-04-29 DIAGNOSIS — I447 Left bundle-branch block, unspecified: Secondary | ICD-10-CM | POA: Diagnosis present

## 2021-04-29 DIAGNOSIS — Z23 Encounter for immunization: Secondary | ICD-10-CM | POA: Diagnosis not present

## 2021-04-29 DIAGNOSIS — R001 Bradycardia, unspecified: Secondary | ICD-10-CM

## 2021-04-29 DIAGNOSIS — Z79899 Other long term (current) drug therapy: Secondary | ICD-10-CM

## 2021-04-29 DIAGNOSIS — I442 Atrioventricular block, complete: Secondary | ICD-10-CM | POA: Diagnosis not present

## 2021-04-29 DIAGNOSIS — I2583 Coronary atherosclerosis due to lipid rich plaque: Secondary | ICD-10-CM | POA: Diagnosis not present

## 2021-04-29 DIAGNOSIS — E1122 Type 2 diabetes mellitus with diabetic chronic kidney disease: Secondary | ICD-10-CM | POA: Diagnosis present

## 2021-04-29 DIAGNOSIS — N1832 Chronic kidney disease, stage 3b: Secondary | ICD-10-CM | POA: Diagnosis present

## 2021-04-29 DIAGNOSIS — I129 Hypertensive chronic kidney disease with stage 1 through stage 4 chronic kidney disease, or unspecified chronic kidney disease: Secondary | ICD-10-CM | POA: Diagnosis present

## 2021-04-29 DIAGNOSIS — I69398 Other sequelae of cerebral infarction: Secondary | ICD-10-CM | POA: Diagnosis not present

## 2021-04-29 DIAGNOSIS — Z91013 Allergy to seafood: Secondary | ICD-10-CM | POA: Diagnosis not present

## 2021-04-29 DIAGNOSIS — R570 Cardiogenic shock: Secondary | ICD-10-CM | POA: Diagnosis not present

## 2021-04-29 DIAGNOSIS — Z90710 Acquired absence of both cervix and uterus: Secondary | ICD-10-CM | POA: Diagnosis not present

## 2021-04-29 DIAGNOSIS — N1831 Chronic kidney disease, stage 3a: Secondary | ICD-10-CM

## 2021-04-29 DIAGNOSIS — R627 Adult failure to thrive: Secondary | ICD-10-CM | POA: Diagnosis present

## 2021-04-29 DIAGNOSIS — R5383 Other fatigue: Secondary | ICD-10-CM

## 2021-04-29 DIAGNOSIS — N183 Chronic kidney disease, stage 3 unspecified: Secondary | ICD-10-CM | POA: Diagnosis present

## 2021-04-29 DIAGNOSIS — I2510 Atherosclerotic heart disease of native coronary artery without angina pectoris: Secondary | ICD-10-CM | POA: Diagnosis present

## 2021-04-29 DIAGNOSIS — I441 Atrioventricular block, second degree: Secondary | ICD-10-CM | POA: Diagnosis present

## 2021-04-29 DIAGNOSIS — E1121 Type 2 diabetes mellitus with diabetic nephropathy: Secondary | ICD-10-CM | POA: Diagnosis present

## 2021-04-29 DIAGNOSIS — K219 Gastro-esophageal reflux disease without esophagitis: Secondary | ICD-10-CM | POA: Diagnosis present

## 2021-04-29 DIAGNOSIS — E1169 Type 2 diabetes mellitus with other specified complication: Secondary | ICD-10-CM | POA: Diagnosis present

## 2021-04-29 DIAGNOSIS — G40909 Epilepsy, unspecified, not intractable, without status epilepticus: Secondary | ICD-10-CM | POA: Diagnosis present

## 2021-04-29 DIAGNOSIS — E876 Hypokalemia: Secondary | ICD-10-CM | POA: Diagnosis not present

## 2021-04-29 DIAGNOSIS — Z91199 Patient's noncompliance with other medical treatment and regimen due to unspecified reason: Secondary | ICD-10-CM

## 2021-04-29 DIAGNOSIS — N179 Acute kidney failure, unspecified: Secondary | ICD-10-CM | POA: Diagnosis not present

## 2021-04-29 DIAGNOSIS — R569 Unspecified convulsions: Secondary | ICD-10-CM

## 2021-04-29 DIAGNOSIS — Z7984 Long term (current) use of oral hypoglycemic drugs: Secondary | ICD-10-CM

## 2021-04-29 DIAGNOSIS — I10 Essential (primary) hypertension: Secondary | ICD-10-CM | POA: Diagnosis not present

## 2021-04-29 DIAGNOSIS — Z955 Presence of coronary angioplasty implant and graft: Secondary | ICD-10-CM

## 2021-04-29 DIAGNOSIS — Z853 Personal history of malignant neoplasm of breast: Secondary | ICD-10-CM

## 2021-04-29 DIAGNOSIS — E440 Moderate protein-calorie malnutrition: Secondary | ICD-10-CM | POA: Diagnosis present

## 2021-04-29 DIAGNOSIS — D573 Sickle-cell trait: Secondary | ICD-10-CM | POA: Diagnosis present

## 2021-04-29 DIAGNOSIS — E860 Dehydration: Secondary | ICD-10-CM | POA: Diagnosis present

## 2021-04-29 DIAGNOSIS — Z7902 Long term (current) use of antithrombotics/antiplatelets: Secondary | ICD-10-CM

## 2021-04-29 DIAGNOSIS — E1140 Type 2 diabetes mellitus with diabetic neuropathy, unspecified: Secondary | ICD-10-CM | POA: Diagnosis present

## 2021-04-29 DIAGNOSIS — E1165 Type 2 diabetes mellitus with hyperglycemia: Secondary | ICD-10-CM | POA: Diagnosis present

## 2021-04-29 DIAGNOSIS — Z8249 Family history of ischemic heart disease and other diseases of the circulatory system: Secondary | ICD-10-CM

## 2021-04-29 LAB — BASIC METABOLIC PANEL
Anion gap: 11 (ref 5–15)
BUN: 49 mg/dL — ABNORMAL HIGH (ref 8–23)
CO2: 20 mmol/L — ABNORMAL LOW (ref 22–32)
Calcium: 8.7 mg/dL — ABNORMAL LOW (ref 8.9–10.3)
Chloride: 108 mmol/L (ref 98–111)
Creatinine, Ser: 1.98 mg/dL — ABNORMAL HIGH (ref 0.44–1.00)
GFR, Estimated: 25 mL/min — ABNORMAL LOW (ref >60)
Glucose, Bld: 142 mg/dL — ABNORMAL HIGH (ref 70–99)
Potassium: 4.6 mmol/L (ref 3.5–5.1)
Sodium: 139 mmol/L (ref 135–145)

## 2021-04-29 LAB — CBC WITH DIFFERENTIAL/PLATELET
Abs Immature Granulocytes: 0.03 10*3/uL (ref 0.00–0.07)
Basophils Absolute: 0 10*3/uL (ref 0.0–0.1)
Basophils Relative: 0 %
Eosinophils Absolute: 0.3 10*3/uL (ref 0.0–0.5)
Eosinophils Relative: 6 %
HCT: 47.2 % — ABNORMAL HIGH (ref 36.0–46.0)
Hemoglobin: 15 g/dL (ref 12.0–15.0)
Immature Granulocytes: 1 %
Lymphocytes Relative: 18 %
Lymphs Abs: 0.9 10*3/uL (ref 0.7–4.0)
MCH: 29.1 pg (ref 26.0–34.0)
MCHC: 31.8 g/dL (ref 30.0–36.0)
MCV: 91.5 fL (ref 80.0–100.0)
Monocytes Absolute: 0.5 10*3/uL (ref 0.1–1.0)
Monocytes Relative: 10 %
Neutro Abs: 3.3 10*3/uL (ref 1.7–7.7)
Neutrophils Relative %: 65 %
Platelets: 226 10*3/uL (ref 150–400)
RBC: 5.16 MIL/uL — ABNORMAL HIGH (ref 3.87–5.11)
RDW: 17.3 % — ABNORMAL HIGH (ref 11.5–15.5)
WBC: 5 10*3/uL (ref 4.0–10.5)
nRBC: 0 % (ref 0.0–0.2)

## 2021-04-29 LAB — URINALYSIS, ROUTINE W REFLEX MICROSCOPIC
Bilirubin Urine: NEGATIVE
Glucose, UA: >=500 mg/dL — AB
Hgb urine dipstick: NEGATIVE
Ketones, ur: 5 mg/dL — AB
Nitrite: NEGATIVE
Protein, ur: NEGATIVE mg/dL
Specific Gravity, Urine: 1.013 (ref 1.005–1.030)
pH: 5 (ref 5.0–8.0)

## 2021-04-29 LAB — RESP PANEL BY RT-PCR (FLU A&B, COVID) ARPGX2
Influenza A by PCR: NEGATIVE
Influenza B by PCR: NEGATIVE
SARS Coronavirus 2 by RT PCR: POSITIVE — AB

## 2021-04-29 LAB — I-STAT CHEM 8, ED
BUN: 48 mg/dL — ABNORMAL HIGH (ref 8–23)
Calcium, Ion: 1.19 mmol/L (ref 1.15–1.40)
Chloride: 113 mmol/L — ABNORMAL HIGH (ref 98–111)
Creatinine, Ser: 2.1 mg/dL — ABNORMAL HIGH (ref 0.44–1.00)
Glucose, Bld: 138 mg/dL — ABNORMAL HIGH (ref 70–99)
HCT: 47 % — ABNORMAL HIGH (ref 36.0–46.0)
Hemoglobin: 16 g/dL — ABNORMAL HIGH (ref 12.0–15.0)
Potassium: 4.4 mmol/L (ref 3.5–5.1)
Sodium: 144 mmol/L (ref 135–145)
TCO2: 22 mmol/L (ref 22–32)

## 2021-04-29 LAB — HEPATIC FUNCTION PANEL
ALT: 11 U/L (ref 0–44)
AST: 14 U/L — ABNORMAL LOW (ref 15–41)
Albumin: 3.2 g/dL — ABNORMAL LOW (ref 3.5–5.0)
Alkaline Phosphatase: 86 U/L (ref 38–126)
Bilirubin, Direct: 0.2 mg/dL (ref 0.0–0.2)
Indirect Bilirubin: 0.7 mg/dL (ref 0.3–0.9)
Total Bilirubin: 0.9 mg/dL (ref 0.3–1.2)
Total Protein: 7.2 g/dL (ref 6.5–8.1)

## 2021-04-29 LAB — TSH: TSH: 1.546 u[IU]/mL (ref 0.350–4.500)

## 2021-04-29 LAB — MAGNESIUM: Magnesium: 2.6 mg/dL — ABNORMAL HIGH (ref 1.7–2.4)

## 2021-04-29 LAB — T4, FREE: Free T4: 0.98 ng/dL (ref 0.61–1.12)

## 2021-04-29 LAB — TROPONIN I (HIGH SENSITIVITY)
Troponin I (High Sensitivity): 11 ng/L (ref <18)
Troponin I (High Sensitivity): 9 ng/L (ref <18)

## 2021-04-29 MED ORDER — ACETAMINOPHEN 650 MG RE SUPP: 650.0000 mg | Freq: Four times a day (QID) | RECTAL | Status: DC | PRN

## 2021-04-29 MED ORDER — SODIUM CHLORIDE 0.45 % IV SOLN: INTRAVENOUS | Status: DC

## 2021-04-29 MED ORDER — ATROPINE SULFATE 1 % OP SOLN: 2.0000 [drp] | Freq: Three times a day (TID) | OPHTHALMIC | Status: DC

## 2021-04-29 MED ORDER — SODIUM CHLORIDE 0.45 % IV SOLN: INTRAVENOUS | Status: AC

## 2021-04-29 MED ORDER — LORATADINE 10 MG PO TABS
10.0000 mg | ORAL_TABLET | Freq: Every day | ORAL | Status: DC
  Administered 2021-04-30 – 2021-05-01 (×2): 10 mg via ORAL
  Filled 2021-04-29 (×2): qty 1

## 2021-04-29 MED ORDER — EMPAGLIFLOZIN 10 MG PO TABS
10.0000 mg | ORAL_TABLET | Freq: Every day | ORAL | Status: DC
  Administered 2021-04-30: 10 mg via ORAL
  Filled 2021-04-29: qty 1

## 2021-04-29 MED ORDER — CLOPIDOGREL BISULFATE 75 MG PO TABS: 75.0000 mg | ORAL_TABLET | Freq: Every day | ORAL | Status: DC

## 2021-04-29 MED ORDER — LACTATED RINGERS IV SOLN: INTRAVENOUS | Status: DC

## 2021-04-29 MED ORDER — ATORVASTATIN CALCIUM 10 MG PO TABS
20.0000 mg | ORAL_TABLET | Freq: Every day | ORAL | Status: DC
  Administered 2021-04-30: 20 mg via ORAL
  Filled 2021-04-29: qty 2

## 2021-04-29 MED ORDER — ACETAMINOPHEN 325 MG PO TABS: 650.0000 mg | ORAL_TABLET | Freq: Four times a day (QID) | ORAL | Status: DC | PRN

## 2021-04-29 MED ORDER — NITROGLYCERIN 0.4 MG SL SUBL: 0.4000 mg | SUBLINGUAL_TABLET | SUBLINGUAL | Status: DC | PRN

## 2021-04-29 MED ORDER — LEVETIRACETAM ER 500 MG PO TB24: 500.0000 mg | ORAL_TABLET | Freq: Every day | ORAL | Status: DC

## 2021-04-29 MED ORDER — EMPAGLIFLOZIN 10 MG PO TABS: 10.0000 mg | ORAL_TABLET | Freq: Every day | ORAL | Status: DC

## 2021-04-29 MED ORDER — TRAZODONE HCL 50 MG PO TABS: 25.0000 mg | ORAL_TABLET | Freq: Every evening | ORAL | Status: DC | PRN

## 2021-04-29 MED ORDER — CLOPIDOGREL BISULFATE 75 MG PO TABS
75.0000 mg | ORAL_TABLET | Freq: Every day | ORAL | Status: DC
  Administered 2021-04-30 – 2021-05-01 (×2): 75 mg via ORAL
  Filled 2021-04-29 (×2): qty 1

## 2021-04-29 MED ORDER — HYDRALAZINE HCL 50 MG PO TABS
50.0000 mg | ORAL_TABLET | Freq: Two times a day (BID) | ORAL | Status: DC
  Administered 2021-04-29 – 2021-05-01 (×4): 50 mg via ORAL
  Filled 2021-04-29 (×2): qty 1
  Filled 2021-04-29: qty 2
  Filled 2021-04-29: qty 1

## 2021-04-29 MED ORDER — ENOXAPARIN SODIUM 30 MG/0.3ML IJ SOSY
30.0000 mg | PREFILLED_SYRINGE | INTRAMUSCULAR | Status: DC
  Administered 2021-04-30: 30 mg via SUBCUTANEOUS
  Filled 2021-04-29: qty 0.3

## 2021-04-29 MED ORDER — INSULIN ASPART 100 UNIT/ML IJ SOLN
0.0000 [IU] | Freq: Three times a day (TID) | INTRAMUSCULAR | Status: DC
  Administered 2021-04-30 (×2): 3 [IU] via SUBCUTANEOUS
  Administered 2021-05-01: 2 [IU] via SUBCUTANEOUS

## 2021-04-29 MED ORDER — POLYETHYLENE GLYCOL 3350 17 G PO PACK: 17.0000 g | PACK | Freq: Every day | ORAL | Status: DC | PRN

## 2021-04-29 MED ORDER — ISOSORBIDE MONONITRATE ER 30 MG PO TB24: 60.0000 mg | ORAL_TABLET | Freq: Every day | ORAL | Status: DC

## 2021-04-29 NOTE — Progress Notes (Signed)
PHARMACY NOTE: Covid-19 Treatment Consult   Allergies  Allergen Reactions   Amlodipine Shortness Of Breath and Rash   Fish Allergy Hives   Penicillins Other (See Comments)    REACTION: red rash   Shellfish Allergy Hives   Peanuts [Peanut Oil] Rash   Tomato Rash   Vital Signs: Temp: 97.8 F (36.6 C) (11/09 1422) BP: 156/61 (11/09 2217) Pulse Rate: 76 (11/09 2215) Intake/Output from previous day: No intake/output data recorded. Intake/Output from this shift: No intake/output data recorded. Vent settings for last 24 hours:    Labs: Recent Labs    04/29/21 1437 04/29/21 1542  WBC 5.0  --   HGB 15.0 16.0*  HCT 47.2* 47.0*  PLT 226  --   CREATININE 1.98* 2.10*  MG 2.6*  --   ALBUMIN 3.2*  --   PROT 7.2  --   AST 14*  --   ALT 11  --   ALKPHOS 86  --   BILITOT 0.9  --   BILIDIR 0.2  --   IBILI 0.7  --    Estimated Creatinine Clearance: 22.8 mL/min (A) (by C-G formula based on SCr of 2.1 mg/dL (H)).  No results for input(s): GLUCAP in the last 72 hours.  Plan: called microbiology lab and found cycle threshold to be 31. Since CT higher than cut off to treat, will not place any orders for COVID-19 Treatment at this time.   Joetta Manners, PharmD, [facility] Emergency Medicine Clinical Pharmacist ED RPh Phone: [place]: [phone]

## 2021-04-29 NOTE — ED Provider Notes (Signed)
[facility] EMERGENCY DEPARTMENT Provider Note   CSN: [id number] Arrival date & time: 04/29/21  1417     History Chief Complaint  Patient presents with   Fatigue   Bradycardia    Kayla [name] is a 80 y.o. female.  HPI Patient presents for generalized weakness, decreased p.o. intake, and decreased activity.  She was diagnosed with COVID 2 weeks ago.  She states that she has not been eating because she has not had an appetite.  She denies any nausea, vomiting, or diarrhea.  She was seen in urgent care and instructed to come to the ED for dehydration.  She arrives by EMS.  Twelve-lead EKG showed bradycardia with heart rate in the range of 40.  250 cc normal saline given prior to arrival.  Currently, patient denies any discomfort or shortness of breath.  She is accompanied by her son at bedside.  At baseline, patient gets around utilizing a walker and/or wheelchair.  She does have history of dementia and is at her mental baseline at this time.    Past Medical History:  Diagnosis Date   Allergy    Shell fish   Anxiety    Arthritis    "knees, back, probably left hand" (01/14/2015)   Asthmatic bronchitis    Blind left eye    CAD (coronary artery disease)    cath in 2016 showing 40% ostial RCA, 40% ostial D1, 90% proximal D1, 85% and 70% sequential distal LAD stenosis and underwent PCI with stents in the D1 and LAD.   Constipation    Depression    Diverticulosis of colon    DJD (degenerative joint disease)    Fibromyalgia    GERD (gastroesophageal reflux disease)    Heart murmur    History of gout    Hypercholesteremia    Hypertension    Personal history of noncompliance with medical treatment, presenting hazards to health    Prolapse of vaginal vault after hysterectomy    Proteinuria    Sickle-cell trait ([place])    Somatic dysfunction    Stroke ([place]) ~ 08/2014   "blind in left eye; weak on left side since" (01/14/2015)   Type II diabetes mellitus ([place])     Venous insufficiency     Patient Active Problem List   Diagnosis Date Noted   CKD (chronic kidney disease) stage 3, GFR 30-59 ml/min ([place]) 04/29/2021   COVID-19 virus infection 04/29/2021   AV block, 2nd degree 04/29/2021   2nd degree AV block    CAD (coronary artery disease)    Vascular dementia ([place]) 12/29/2018   Acute pain of both knees 04/09/2018   Routine general medical examination at a health care facility 03/31/2018   Drooling 08/27/2017   Seizure as late effect of cerebrovascular accident (CVA) ([place])    Blood in stool 02/26/2016   Decreased strength, endurance, and mobility 09/25/2015   Adjustment disorder with depressed mood 07/27/2015   Left homonymous hemianopsia 09/06/2014   Occipital infarction ([place]) 09/05/2014   Abnormal chest x-ray 08/22/2014   Type 2 diabetes mellitus with diabetic neuropathy ([place]) 01/04/2014   Hyperlipidemia associated with type 2 diabetes mellitus ([place]) 05/22/2009   Essential hypertension 05/04/2007   Osteoarthritis 05/04/2007   Fibromyalgia 05/04/2007    Past Surgical History:  Procedure Laterality Date   ABDOMINAL HYSTERECTOMY     CARDIAC CATHETERIZATION N/A 01/14/2015   Procedure: Left Heart Cath and Coronary Angiography;  Surgeon: Charolette Forward, MD;  Location: [facility];  Service: Cardiovascular;  Laterality: N/A;   CARDIAC CATHETERIZATION  ~ 2011   CATARACT EXTRACTION W/ INTRAOCULAR LENS  IMPLANT, BILATERAL Bilateral 2012   CORONARY ANGIOPLASTY     DILATION AND CURETTAGE OF UTERUS  "probably"   LOOP RECORDER IMPLANT N/A 09/03/2014   Procedure: LOOP RECORDER IMPLANT;  Surgeon: Thompson Grayer, MD;  Location: [facility] CATH LAB;  Service: Cardiovascular;  Laterality: N/A;   ROBOTIC ASSISTED LAPAROSCOPIC SACROCOLPOPEXY  09/2009   Dr. Matilde Sprang   TEE WITHOUT CARDIOVERSION N/A 09/02/2014   Procedure: TRANSESOPHAGEAL ECHOCARDIOGRAM (TEE);  Surgeon: Lelon Perla, MD;  Location: [facility] ENDOSCOPY;  Service: Cardiovascular;  Laterality: N/A;      OB History   No obstetric history on file.     Family History  Problem Relation Age of Onset   Prostate cancer Father    Hypertension Father    Seizures Sister    Hypertension Sister    Bell's palsy Son    Heart attack Neg Hx    Stroke Neg Hx     Social History   Tobacco Use   Smoking status: Never   Smokeless tobacco: Never  Substance Use Topics   Alcohol use: Yes    Alcohol/week: 0.0 standard drinks    Comment: 01/14/2015 "might have a glass of wine a few times/yr"   Drug use: No    Home Medications Prior to Admission medications   Medication Sig Start Date End Date Taking? Authorizing Provider  acetaminophen (TYLENOL) 500 MG tablet Take 1,000 mg by mouth every 6 (six) hours as needed for moderate pain or headache.   Yes [provider]  atenolol (TENORMIN) 25 MG tablet TAKE 0.5 TABLETS (12.5 MG TOTAL) BY MOUTH 2 (TWO) TIMES DAILY AFTER A MEAL. 04/07/20  Yes Hoyt Koch, MD  atorvastatin (LIPITOR) 20 MG tablet Take 1 tablet (20 mg total) by mouth daily at 6 PM. Patient taking differently: Take 20 mg by mouth at bedtime. 02/19/15  Yes Hoyt Koch, MD  blood glucose meter kit and supplies KIT Dispense what is covered by patients insurance with strips and lancets. Use twice a day (before breakfast and at bedtime). E11.40 01/26/19  Yes Hoyt Koch, MD  cetirizine (ZYRTEC) 10 MG tablet Take 10 mg by mouth daily.   Yes [provider]  clopidogrel (PLAVIX) 75 MG tablet Take 1 tablet (75 mg total) by mouth daily. OFFICE VISIT IS DUE 07/01/20  Yes Hoyt Koch, MD  Cyanocobalamin (VITAMIN B-12) 2500 MCG SUBL Place 2,500 mcg under the tongue daily.   Yes [provider]  dextromethorphan-guaiFENesin (MUCINEX DM) 30-600 MG 12hr tablet Take 1 tablet by mouth 2 (two) times daily as needed for cough.   Yes [provider]  glucose blood test strip Use to check blood sugars twice daily Patient taking  differently: Check blood sugar every morning 01/26/19  Yes Hoyt Koch, MD  glycopyrrolate (ROBINUL) 1 MG tablet Take 1 mg by mouth daily. 03/27/21  Yes [provider]  hydrALAZINE (APRESOLINE) 50 MG tablet Take 1 tablet (50 mg total) by mouth 2 (two) times daily. Annual appt due in July must see provider for future refills 11/20/19  Yes Hoyt Koch, MD  hydrochlorothiazide (HYDRODIURIL) 12.5 MG tablet Take 12.5 mg by mouth daily. 03/30/21  Yes [provider]  isosorbide mononitrate (IMDUR) 30 MG 24 hr tablet Take 30 mg by mouth daily. 02/13/21  Yes [provider]  JARDIANCE 10 MG TABS tablet TAKE 1 TABLET BY MOUTH EVERY DAY Patient taking  differently: Take 10 mg by mouth daily. 02/23/19  Yes Myrlene Broker, MD  levETIRAcetam (KEPPRA) 500 MG tablet Take 500 mg by mouth at bedtime. 02/25/21  Yes [provider]  lisinopril (ZESTRIL) 20 MG tablet Take 1 tablet (20 mg total) by mouth daily. Needs office visit 06/23/20  Yes Myrlene Broker, MD  Metamucil Fiber CHEW Chew 1 tablet by mouth daily.   Yes [provider]  nitroGLYCERIN (NITROSTAT) 0.4 MG SL tablet Place 0.4 mg under the tongue every 5 (five) minutes as needed for chest pain.   Yes [provider]  ONE TOUCH ULTRA TEST test strip USE TO TEST TWICE DAILY (BEFORE BREAKFAST, BEDTIME) E11.4 Patient taking differently: Check blood sugar every morning 01/03/18  Yes Myrlene Broker, MD  OneTouch Delica Lancets 30G MISC USE TO TEST BLOOD SUGAR TWICE DAILY Patient taking differently: Check blood sugar every morning 07/11/19  Yes Myrlene Broker, MD  polyethylene glycol (MIRALAX / GLYCOLAX) packet Take 17 g by mouth daily as needed for moderate constipation.   Yes [provider]  vitamin E 180 MG (400 UNITS) capsule Take 400 Units by mouth daily.   Yes [provider]  atropine 1 % ophthalmic solution PLACE 2 DROPS UNDER THE TONGUE 3 (THREE) TIMES  DAILY. Patient not taking: No sig reported 04/09/20   Myrlene Broker, MD  glipiZIDE (GLUCOTROL XL) 2.5 MG 24 hr tablet Take 1 tablet (2.5 mg total) by mouth 2 (two) times daily. VISIT IS OVERDUE!!!!! Patient not taking: No sig reported 07/31/20   Myrlene Broker, MD  isosorbide mononitrate (IMDUR) 60 MG 24 hr tablet Take 60 mg by mouth daily.  Patient not taking: Reported on 04/29/2021 12/02/15   [provider]  levETIRAcetam (KEPPRA XR) 500 MG 24 hr tablet Take 1 tablet (500 mg total) by mouth daily. Patient needs to call our office to schedule a appointment for further refills Patient not taking: No sig reported 07/07/20   Myrlene Broker, MD  [facility] ULTRA test strip USE TO CHECK BLOOD SUGAR TWICE DAILY Patient not taking: Reported on 04/29/2021 12/12/20   Myrlene Broker, MD    Allergies    Amlodipine, Fish allergy, Penicillins, Shellfish allergy, Peanuts [peanut oil], and Tomato  Review of Systems   Review of Systems  Constitutional:  Positive for activity change, appetite change and fatigue. Negative for chills and fever.  HENT:  Negative for congestion, ear pain and sore throat.   Eyes:  Negative for pain and visual disturbance.  Respiratory:  Negative for cough, chest tightness and shortness of breath.   Cardiovascular:  Negative for chest pain and palpitations.  Gastrointestinal:  Negative for abdominal pain, diarrhea, nausea and vomiting.  Genitourinary:  Negative for dysuria, flank pain and hematuria.  Musculoskeletal:  Negative for arthralgias, back pain, joint swelling, myalgias and neck pain.  Skin:  Negative for color change and rash.  Neurological:  Positive for weakness (Generalized). Negative for dizziness, seizures, syncope, numbness and headaches.  Hematological:  Does not bruise/bleed easily.  All other systems reviewed and are negative.  Physical Exam Updated Vital Signs BP (!) 156/61   Pulse 76   Temp 97.8 F (36.6 C)   Resp 20    Ht 5' 6.5" (1.689 m)   Wt 81.2 kg   SpO2 92%   BMI 28.46 kg/m   Physical Exam Vitals and nursing note reviewed.  Constitutional:      General: She is not in acute distress.  Appearance: Normal appearance. She is well-developed. She is not ill-appearing, toxic-appearing or diaphoretic.  HENT:     Head: Normocephalic and atraumatic.     Right Ear: External ear normal.     Left Ear: External ear normal.     Nose: Nose normal.  Eyes:     General: No scleral icterus.    Extraocular Movements: Extraocular movements intact.     Conjunctiva/sclera: Conjunctivae normal.  Cardiovascular:     Rate and Rhythm: Regular rhythm. Bradycardia present.     Heart sounds: No murmur heard. Pulmonary:     Effort: Pulmonary effort is normal. No respiratory distress.     Breath sounds: Normal breath sounds. No wheezing or rales.  Abdominal:     Palpations: Abdomen is soft.     Tenderness: There is no abdominal tenderness.  Musculoskeletal:        General: Normal range of motion.     Cervical back: Normal range of motion and neck supple. No rigidity.     Right lower leg: No edema.     Left lower leg: No edema.  Skin:    General: Skin is warm and dry.     Coloration: Skin is not jaundiced or pale.     Comments: Skin tenting present  Neurological:     General: No focal deficit present.     Mental Status: She is alert. Mental status is at baseline.     Cranial Nerves: No cranial nerve deficit.     Sensory: No sensory deficit.     Motor: No weakness.  Psychiatric:        Mood and Affect: Mood normal.        Behavior: Behavior normal.        Thought Content: Thought content normal.        Judgment: Judgment normal.    ED Results / Procedures / Treatments   Labs (all labs ordered are listed, but only abnormal results are displayed) Labs Reviewed  RESP PANEL BY RT-PCR (FLU A&B, COVID) ARPGX2 - Abnormal; Notable for the following components:      Result Value   SARS Coronavirus 2 by RT  PCR POSITIVE (*)    All other components within normal limits  CBC WITH DIFFERENTIAL/PLATELET - Abnormal; Notable for the following components:   RBC 5.16 (*)    HCT 47.2 (*)    RDW 17.3 (*)    All other components within normal limits  BASIC METABOLIC PANEL - Abnormal; Notable for the following components:   CO2 20 (*)    Glucose, Bld 142 (*)    BUN 49 (*)    Creatinine, Ser 1.98 (*)    Calcium 8.7 (*)    GFR, Estimated 25 (*)    All other components within normal limits  HEPATIC FUNCTION PANEL - Abnormal; Notable for the following components:   Albumin 3.2 (*)    AST 14 (*)    All other components within normal limits  MAGNESIUM - Abnormal; Notable for the following components:   Magnesium 2.6 (*)    All other components within normal limits  URINALYSIS, ROUTINE W REFLEX MICROSCOPIC - Abnormal; Notable for the following components:   APPearance HAZY (*)    Glucose, UA >=500 (*)    Ketones, ur 5 (*)    Leukocytes,Ua SMALL (*)    Bacteria, UA RARE (*)    All other components within normal limits  I-STAT CHEM 8, ED - Abnormal; Notable for the following components:   Chloride  113 (*)    BUN 48 (*)    Creatinine, Ser 2.10 (*)    Glucose, Bld 138 (*)    Hemoglobin 16.0 (*)    HCT 47.0 (*)    All other components within normal limits  TSH  T4, FREE  HEMOGLOBIN T7R  BASIC METABOLIC PANEL  TROPONIN I (HIGH SENSITIVITY)  TROPONIN I (HIGH SENSITIVITY)    EKG None  Radiology DG Chest Portable 1 View  Result Date: 04/29/2021 CLINICAL DATA:  80 year old female with history of weakness. EXAM: PORTABLE CHEST 1 VIEW COMPARISON:  Chest x-ray 06/19/2016. FINDINGS: Transcutaneous defibrillator pad projecting over the left hemithorax. Lung volumes are low. No consolidative airspace disease. No pleural effusions. No pneumothorax. No evidence of pulmonary edema. Heart size is normal. Mediastinal contours are distorted by patient positioning. Atherosclerotic calcifications in the  thoracic aorta. IMPRESSION: 1. Low lung volumes without radiographic evidence of acute cardiopulmonary disease. 2. Aortic atherosclerosis. Electronically Signed   By: Vinnie Langton M.D.   On: 04/29/2021 15:23    Procedures Procedures   Medications Ordered in ED Medications  lactated ringers infusion ( Intravenous New Bag/Given 04/29/21 1614)  atorvastatin (LIPITOR) tablet 20 mg (has no administration in time range)  hydrALAZINE (APRESOLINE) tablet 50 mg (50 mg Oral Given 04/29/21 2217)  isosorbide mononitrate (IMDUR) 24 hr tablet 60 mg (60 mg Oral Not Given 04/29/21 2208)  nitroGLYCERIN (NITROSTAT) SL tablet 0.4 mg (has no administration in time range)  polyethylene glycol (MIRALAX / GLYCOLAX) packet 17 g (has no administration in time range)  loratadine (CLARITIN) tablet 10 mg (10 mg Oral Not Given 04/29/21 2211)  atropine 1 % ophthalmic solution 2 drop (2 drops Sublingual Not Given 04/29/21 2213)  enoxaparin (LOVENOX) injection 30 mg (30 mg Subcutaneous Not Given 04/29/21 2212)  0.45 % sodium chloride infusion ( Intravenous New Bag/Given 04/29/21 2347)  acetaminophen (TYLENOL) tablet 650 mg (has no administration in time range)    Or  acetaminophen (TYLENOL) suppository 650 mg (has no administration in time range)  traZODone (DESYREL) tablet 25 mg (has no administration in time range)  insulin aspart (novoLOG) injection 0-15 Units (has no administration in time range)  empagliflozin (JARDIANCE) tablet 10 mg (has no administration in time range)  clopidogrel (PLAVIX) tablet 75 mg (has no administration in time range)  levETIRAcetam (KEPPRA XR) 24 hr tablet 500 mg (has no administration in time range)  0.45 % sodium chloride infusion (has no administration in time range)    ED Course  I have reviewed the triage vital signs and the nursing notes.  Pertinent labs & imaging results that were available during my care of the patient were reviewed by me and considered in my medical decision  making (see chart for details).    MDM Rules/Calculators/A&P                         CRITICAL CARE Performed by: Godfrey Pick   Total critical care time: 35 minutes  Critical care time was exclusive of separately billable procedures and treating other patients.  Critical care was necessary to treat or prevent imminent or life-threatening deterioration.  Critical care was time spent personally by me on the following activities: development of treatment plan with patient and/or surrogate as well as nursing, discussions with consultants, evaluation of patient's response to treatment, examination of patient, obtaining history from patient or surrogate, ordering and performing treatments and interventions, ordering and review of laboratory studies, ordering and review of radiographic studies,  pulse oximetry and re-evaluation of patient's condition.   Patient presents for decreased appetite, decreased activity, generalized weakness, and fatigue.  On arrival in the ED, she is found to be bradycardic in the range of 40.  Initial EKG shows sinus rhythm with a 2-1 AV nodal block.  Cardiology was engaged early on.  Laboratory work-up was initiated to assess for electrolyte abnormalities or other underlying conditions.  She does take atenolol and this may be contributing to her bradycardia.  Initial cardiology recommendations are for medical optimization and admission.  They will follow in consult.  Patient currently denying any symptoms.  Despite her bradycardia, she is maintaining normal blood pressure.  She is at her mental baseline.  She remained on cardiac monitor and did have some increase in her heart rate, up to the 50s.  Prior to arrival, 250 cc of normal saline given.  We will continue to rehydrate gently given her decreased p.o. intake over the past week.  On review of chart, patient is [facility] by [facility].  Her cardiologist is Golden Hurter.  She has a history of the following: Left heart cath in 2016  showing multivessel disease.  She did undergo LAD and D1 stents; HLD, HTN, DM2.  Cardio recommendations today are following: Medicine admission; holding home atenolol for washout, if still bradycardic after 48 hours, consider pacemaker. Also need to hold lisinopril given Cr 2.1 and allow BP to drift up.  Patient was admitted for ongoing management.   Final Clinical Impression(s) / ED Diagnoses Final diagnoses:  Bradycardia  Fatigue, unspecified type    Rx / DC Orders ED Discharge Orders     None        Godfrey Pick, MD 04/29/21 2357

## 2021-04-29 NOTE — ED Notes (Signed)
MD [place] notified of HR of 38. Pt mentation at baseline.

## 2021-04-29 NOTE — ED Triage Notes (Signed)
Pt arrives via [facility] from home for c/o dehydration. Pt has hx of dementia, at baseline per EMS and family. Pt recently recovering from [place] in Oct 2022. Per family pt has had decreased appetite and increased lethargy.   12-lead showed HR 38-40 with LBBB per EMS.   #20 L hand with 250 mL NS by EMS.   EMS last VS 128/60, HR 40, RR 18, 98% on RA, CBG 144

## 2021-04-29 NOTE — ED Notes (Signed)
Admitting doctor at  the bedside ,eds ordered not verified yet  not in the pyxis

## 2021-04-29 NOTE — ED Notes (Signed)
Report given to rn [id number] 

## 2021-04-29 NOTE — Consult Note (Addendum)
Cardiology Consultation:   Patient ID: Kayla [name] MRN: [id number]; DOB: 02/10/1941  Admit date: 04/29/2021 Date of Consult: 04/29/2021  PCP:  Cipriano Mile, NP   [facility] Providers Cardiologist:  Fransico Him, MD        Patient Profile:   Kayla [name] is a 80 y.o. female with a hx of CAD, HTN, HLD, DM II, sickle cell trait, GERD, breast cancer, history of CVA and history of medical noncompliance who is being seen 04/29/2021 for the evaluation of 2:1 AV block at the request of Dr. Doren Custard.  History of Present Illness:   Kayla [name] is a 80 year old female with past medical history of CAD, HTN, HLD, DM II, sickle cell trait, GERD, breast cancer, history of CVA and history of medical noncompliance.  Previous cardiac catheterization in 2016 showed a 40% ostial RCA, 40% ostial D1, 90% proximal D1, 85 and a 70% sequential distal LAD lesion.  He underwent stenting of D1 and LAD.  Last echocardiogram obtained on 09/02/2014 showed EF 55 to 60%, normal wall motion, mild MR.  Echocardiogram was obtained in the setting of acute CVA.  Loop recorder was placed at that time.  Last transmission from the loop recorder was in March 2019.  Patient was last seen by Dr. Radford Pax on 05/11/2019 for preoperative clearance prior to breast surgery after being diagnosed with breast cancer.  Subsequent Myoview obtained on 05/24/2019 showed EF 75%, no ischemia or infarction, normal wall motion.  Patient was seen by Dr. Erroll Luna of [facility] surgery on 03/30/2021 for recently diagnosed intraductal papilloma, surgery versus nonoperative management has been discussed with the patient.  Given her overall health and the low risk of malignancy, she opted for observation.  According to the patient's son, she was seen by her PCP in October as well who did not mention any issue with her blood pressure or heart rate.  In the past 2 weeks, she has been having increased cough.  Her son used home COVID test on her on  04/20/2019 which came back positive.  She has been managed conservatively at home.  In the past 4 days, she has completely lost her appetite and has increased fatigue.  She denies any chest pain.  Patient was eventually sent to [facility] on 04/29/2021, on arrival, she was noted to be in 2-1 AV block with heart rate in the high 30s to low 40s range.  She also had AKI with creatinine up to 2.1 from the previous baseline of 1.49 from 2 years ago.  Chest x-ray showed aortic atherosclerosis, low lung volumes without radiographic evidence of acute cardiopulmonary disease.  According to the patient's son, last dose of atenolol was this morning.  She is currently on 12.5 mg twice daily dosing of atenolol at home.  Systolic blood pressure has been in the 329J to 188C systolic.  Cardiology service has been consulted for 2-1 AV block.   Past Medical History:  Diagnosis Date   Allergy    Shell fish   Anxiety    Arthritis    "knees, back, probably left hand" (01/14/2015)   Asthmatic bronchitis    Blind left eye    CAD (coronary artery disease)    cath in 2016 showing 40% ostial RCA, 40% ostial D1, 90% proximal D1, 85% and 70% sequential distal LAD stenosis and underwent PCI with stents in the D1 and LAD.   Constipation    Depression    Diverticulosis of colon    DJD (degenerative  joint disease)    Fibromyalgia    GERD (gastroesophageal reflux disease)    Heart murmur    History of gout    Hypercholesteremia    Hypertension    Personal history of noncompliance with medical treatment, presenting hazards to health    Prolapse of vaginal vault after hysterectomy    Proteinuria    Sickle-cell trait ([place])    Somatic dysfunction    Stroke ([place]) ~ 08/2014   "blind in left eye; weak on left side since" (01/14/2015)   Type II diabetes mellitus ([place])    Venous insufficiency     Past Surgical History:  Procedure Laterality Date   ABDOMINAL HYSTERECTOMY     CARDIAC CATHETERIZATION N/A 01/14/2015    Procedure: Left Heart Cath and Coronary Angiography;  Surgeon: Charolette Forward, MD;  Location: [place] [facility];  Service: Cardiovascular;  Laterality: N/A;   CARDIAC CATHETERIZATION  ~ 2011   CATARACT EXTRACTION W/ INTRAOCULAR LENS  IMPLANT, BILATERAL Bilateral 2012   CORONARY ANGIOPLASTY     DILATION AND CURETTAGE OF UTERUS  "probably"   LOOP RECORDER IMPLANT N/A 09/03/2014   Procedure: LOOP RECORDER IMPLANT;  Surgeon: Thompson Grayer, MD;  Location: [facility] CATH LAB;  Service: Cardiovascular;  Laterality: N/A;   ROBOTIC ASSISTED LAPAROSCOPIC SACROCOLPOPEXY  09/2009   Dr. Matilde Sprang   TEE WITHOUT CARDIOVERSION N/A 09/02/2014   Procedure: TRANSESOPHAGEAL ECHOCARDIOGRAM (TEE);  Surgeon: Lelon Perla, MD;  Location: [facility] ENDOS[facility];  Service: Cardiovascular;  Laterality: N/A;     Home Medications:  Prior to Admission medications   Medication Sig Start Date End Date Taking? Authorizing Provider  atenolol (TENORMIN) 25 MG tablet TAKE 0.5 TABLETS (12.5 MG TOTAL) BY MOUTH 2 (TWO) TIMES DAILY AFTER A MEAL. 04/07/20   Hoyt Koch, MD  atorvastatin (LIPITOR) 20 MG tablet Take 1 tablet (20 mg total) by mouth daily at 6 PM. 02/19/15   Hoyt Koch, MD  atropine 1 % ophthalmic solution PLACE 2 DROPS UNDER THE TONGUE 3 (THREE) TIMES DAILY. 04/09/20   Hoyt Koch, MD  blood glucose meter kit and supplies KIT Dispense what is covered by patients insurance with strips and lancets. Use twice a day (before breakfast and at bedtime). E11.40 01/26/19   Hoyt Koch, MD  cetirizine (ZYRTEC) 10 MG tablet Take 10 mg by mouth daily.    [provider]  clopidogrel (PLAVIX) 75 MG tablet Take 1 tablet (75 mg total) by mouth daily. OFFICE VISIT IS DUE 07/01/20   Hoyt Koch, MD  Cyanocobalamin (VITAMIN B-12) 2500 MCG SUBL Place 2,500 mcg under the tongue daily.    [provider]  glipiZIDE (GLUCOTROL XL) 2.5 MG 24 hr tablet Take 1 tablet (2.5 mg total) by mouth 2  (two) times daily. VISIT IS OVERDUE!!!!! 07/31/20   Hoyt Koch, MD  glucose blood test strip Use to check blood sugars twice daily 01/26/19   Hoyt Koch, MD  hydrALAZINE (APRESOLINE) 50 MG tablet Take 1 tablet (50 mg total) by mouth 2 (two) times daily. Annual appt due in July must see provider for future refills 11/20/19   Hoyt Koch, MD  isosorbide mononitrate (IMDUR) 60 MG 24 hr tablet Take 60 mg by mouth daily.  12/02/15   [provider]  JARDIANCE 10 MG TABS tablet TAKE 1 TABLET BY MOUTH EVERY DAY 02/23/19   Hoyt Koch, MD  levETIRAcetam (KEPPRA XR) 500 MG 24 hr tablet Take 1 tablet (500 mg total) by mouth daily.  Patient needs to call our office to schedule a appointment for further refills 07/07/20   Hoyt Koch, MD  lisinopril (ZESTRIL) 20 MG tablet Take 1 tablet (20 mg total) by mouth daily. Needs office visit 06/23/20   Hoyt Koch, MD  nitroGLYCERIN (NITROSTAT) 0.4 MG SL tablet Place 0.4 mg under the tongue every 5 (five) minutes as needed for chest pain.    [provider]  ONE TOUCH ULTRA TEST test strip USE TO TEST TWICE DAILY (BEFORE BREAKFAST, BEDTIME) E11.4 01/03/18   Hoyt Koch, MD  OneTouch Delica Lancets 56Y MISC USE TO TEST BLOOD SUGAR TWICE DAILY 07/11/19   Hoyt Koch, MD  [facility] ULTRA test strip USE TO CHECK BLOOD SUGAR TWICE DAILY 12/12/20   Hoyt Koch, MD  polyethylene glycol (MIRALAX / GLYCOLAX) packet Take 17 g by mouth daily as needed for moderate constipation.    [provider]    Inpatient Medications: Scheduled Meds:  Continuous Infusions:  lactated ringers     PRN Meds:   Allergies:    Allergies  Allergen Reactions   Amlodipine Shortness Of Breath and Rash   Fish Allergy Hives   Penicillins Other (See Comments)    REACTION: red rash   Shellfish Allergy Hives   Peanuts [Peanut Oil] Rash   Tomato Rash    Social History:   Social History    Socioeconomic History   Marital status: Married    Spouse name: Not on file   Number of children: 4   Years of education: 13   Highest education level: Not on file  Occupational History   Occupation: [employer]    Comment: retired  Tobacco Use   Smoking status: Never   Smokeless tobacco: Never  Substance and Sexual Activity   Alcohol use: Yes    Alcohol/week: 0.0 standard drinks    Comment: 01/14/2015 "might have a glass of wine a few times/yr"   Drug use: No   Sexual activity: Never  Other Topics Concern   Not on file  Social History Narrative   Married   Right handed   Caffeine use - 1 cup coffee daily   Social Determinants of Health   Financial Resource Strain: Not on file  Food Insecurity: Not on file  Transportation Needs: Not on file  Physical Activity: Not on file  Stress: Not on file  Social Connections: Not on file  Intimate Partner Violence: Not on file    Family History:    Family History  Problem Relation Age of Onset   Prostate cancer Father    Hypertension Father    Seizures Sister    Hypertension Sister    Bell's palsy Son    Heart attack Neg Hx    Stroke Neg Hx      ROS:  Please see the history of present illness.   All other ROS reviewed and negative.     Physical Exam/Data:   Vitals:   04/29/21 1445 04/29/21 1500 04/29/21 1515 04/29/21 1530  BP: 126/63 (!) 123/59 (!) 130/58 (!) 131/56  Pulse: (!) 39 (!) 38 (!) 38 (!) 39  Resp: (!) 9 (!) _0 Temp:      SpO2: 97% 98% 98% 97%   No intake or output data in the 24 hours ending 04/29/21 1546 Last 3 Weights 05/23/2019 05/11/2019 04/13/2019  Weight (lbs) 179 lb 179 lb 1.9 oz 180 lb  Weight (kg) 81.194 kg 81.248 kg 81.647 kg     There  is no height or weight on file to calculate BMI.  General: Frail appearing HEENT: normal Neck: no JVD Vascular: No carotid bruits; Distal pulses 2+ bilaterally Cardiac:  bradycardia; no murmur  Lungs: Diffuse rhonchi with cough Abd: soft,  nontender, no hepatomegaly  Ext: no edema Musculoskeletal:  No deformities, BUE and BLE strength normal and equal Skin: warm and dry  Neuro:  CNs 2-12 intact, no focal abnormalities noted Psych:  Normal affect   EKG:  The EKG was personally reviewed and demonstrates: 2-1 AV block Telemetry:  Telemetry was personally reviewed and demonstrates: 2-1 AV block  Relevant CV Studies:  Echo 09/02/2014 LV EF: 55% -   60%  Study Conclusions   - Left ventricle: Hypertrophy was noted. Systolic function was    normal. The estimated ejection fraction was in the range of 55%    to 60%. Wall motion was normal; there were no regional wall    motion abnormalities.  - Aortic valve: No evidence of vegetation.  - Mitral valve: No evidence of vegetation. There was mild    regurgitation.  - Left atrium: The atrium was mildly dilated. No evidence of    thrombus in the atrial cavity or appendage. There was mild    spontaneous echo contrast (&quot;smoke&quot;) in the cavity.  - Right atrium: No evidence of thrombus in the atrial cavity or    appendage.  - Atrial septum: No defect or patent foramen ovale was identified.  - Tricuspid valve: No evidence of vegetation.   Impressions:   - Negative saline contrast echo. Nondiagnostic late bubbles noted    possibly secondary to intrapulmonary shunt.   Laboratory Data:  High Sensitivity Troponin:  No results for input(s): TROPONINIHS in the last 720 hours.   Chemistry Recent Labs  Lab 04/29/21 1542  NA 144  K 4.4  CL 113*  GLUCOSE 138*  BUN 48*  CREATININE 2.10*    No results for input(s): PROT, ALBUMIN, AST, ALT, ALKPHOS, BILITOT in the last 168 hours. Lipids No results for input(s): CHOL, TRIG, HDL, LABVLDL, LDLCALC, CHOLHDL in the last 168 hours.  Hematology Recent Labs  Lab 04/29/21 1437 04/29/21 1542  WBC 5.0  --   RBC 5.16*  --   HGB 15.0 16.0*  HCT 47.2* 47.0*  MCV 91.5  --   MCH 29.1  --   MCHC 31.8  --   RDW 17.3*  --   PLT 226   --    Thyroid No results for input(s): TSH, FREET4 in the last 168 hours.  BNPNo results for input(s): BNP, PROBNP in the last 168 hours.  DDimer No results for input(s): DDIMER in the last 168 hours.   Radiology/Studies:  DG Chest Portable 1 View  Result Date: 04/29/2021 CLINICAL DATA:  79 year old female with history of weakness. EXAM: PORTABLE CHEST 1 VIEW COMPARISON:  Chest x-ray 06/19/2016. FINDINGS: Transcutaneous defibrillator pad projecting over the left hemithorax. Lung volumes are low. No consolidative airspace disease. No pleural effusions. No pneumothorax. No evidence of pulmonary edema. Heart size is normal. Mediastinal contours are distorted by patient positioning. Atherosclerotic calcifications in the thoracic aorta. IMPRESSION: 1. Low lung volumes without radiographic evidence of acute cardiopulmonary disease. 2. Aortic atherosclerosis. Electronically Signed   By: Vinnie Langton M.D.   On: 04/29/2021 15:23     Assessment and Plan:   2-1 AV block  -Only AV nodal blocking agent is atenolol 12.5 mg twice a day at home.  Last dose was this morning. -Patient is alert  and mentating okay.  He is clearly fatigued.  Will discuss with MD.  Beta-blocker washout for the time being.  If heart rate does not improve after 48 hours, will consider pacemaker.  COVID-19: Patient was reportedly tested positive on 04/19/2021 based on home test.  She has been having significant fatigue, shortness of breath and loss of appetite for the past 2 weeks.  Will discuss with MD, likely will need to get hospitalist on board.  Acute on chronic renal insufficiency: Last creatinine was 1.49 from 2 years ago.  She arrived with a creatinine of 2.1.  Unclear how much is related to bradycardia and hypoperfusion  CAD: Denies any chest pain.  Last PCI 2016.  Hypertension: We will allow blood pressure to drift up given bradycardia.  Given acute on chronic renal issue, hold  lisinopril.  Hyperlipidemia  DM2  Sickle cell trait   History of breast cancer: Conservative management.  Recently seen by [facility] surgery for intraductal papilloma of right breast.  Given her overall health and a low risk of malignancy, she opted for conservative management.  History of CVA: Had acute CVA in early 2016 and had a loop recorder.  Last loop recorder interrogation was back in 08/2017.  Battery likely has completely drained by this point  History of medical noncompliance: [place], her son manage her medication, therefore she has better compliance.   Risk Assessment/Risk Scores:            For questions or updates, please contact [place] Please consult [email or web address] for contact info under    Signed, Almyra Deforest, [place]  04/29/2021 3:46 PM  Patient seen, examined. Available data reviewed. Agree with findings, assessment, and plan as outlined by Almyra Deforest, PA.  The patient is independently interviewed and examined.  Her son is at the bedside.  She is an elderly woman in no acute distress.  She is a poor historian.  HEENT is normal, JVP is normal, carotid upstrokes are normal without bruits, lungs are rhonchorous bilaterally, heart is bradycardic and regular with no murmur or gallop, abdomen is soft and nontender, extremities have no edema, skin is warm and dry with no rash, neurologic exam is nonfocal.  EKG shows 2-1 AV block with left bundle branch block.  Heart rate is 39 bpm.  Other metabolic issues include acute kidney injury with creatinine 2.1 mg/dL.  Electrolytes are otherwise unremarkable.  The patient is taking low-dose atenolol and this will be placed on hold.  We will observe her heart rhythm over the next 24 to 48 hours and hopefully her AV conduction will normalize once the beta-blocker washes out of her system.  It is possible that she may need EP evaluation for permanent pacemaker if her heart rate does not improve.  Sherren Mocha, M.D. 04/29/2021 4:29  PM

## 2021-04-29 NOTE — ED Notes (Signed)
Lab called a pos covid  the pt was diagnosed with covid the last week of october

## 2021-04-29 NOTE — ED Notes (Signed)
Doctor at  the bedside pt alert

## 2021-04-29 NOTE — H&P (Signed)
History and Physical    Kayla [name] YQI:[id number] DOB: Jul 17, 1940 DOA: 04/29/2021  PCP: Cipriano Mile, NP   Patient coming from: home  I have personally briefly reviewed patient's old medical records in [place]  Chief Complaint: weakness, decreased appetite  HPI: Kayla [name] is a 80 y.o. female with medical history significant of  CAD, HTN, HLD, DM II, sickle cell trait, GERD, breast cancer, history of CVA and history of medical noncompliance. Her cardiac hx include h/o CAD s/p PCI/stenting July '16, Myoview Dec '20 with EF 75% nl wall motion. Last cardiology OV Dec '20 for preop clearance. Per family she has been weak, poor appetite and not feeling well. She presents to [facility] for evaluation.   ED Course: T 97.8  123/82  HR 58  RR 12. Cmet with BUN 49-48, Cr 1.98-2.10 Troponoins 11 to 9. Hgb 16. TSH 1.56, free T4 0.98. CXR NAD. EKG with 2:1 AV block, LBBB. Dr. Burt Knack for cardiology consulted: recommends tele admission, holding BB and observing. [facility] called to admit patient.   Review of Systems: As per HPI otherwise 10 point review of systems negative.    Past Medical History:  Diagnosis Date   Allergy    Shell fish   Anxiety    Arthritis    "knees, back, probably left hand" (01/14/2015)   Asthmatic bronchitis    Blind left eye    CAD (coronary artery disease)    cath in 2016 showing 40% ostial RCA, 40% ostial D1, 90% proximal D1, 85% and 70% sequential distal LAD stenosis and underwent PCI with stents in the D1 and LAD.   Constipation    Depression    Diverticulosis of colon    DJD (degenerative joint disease)    Fibromyalgia    GERD (gastroesophageal reflux disease)    Heart murmur    History of gout    Hypercholesteremia    Hypertension    Personal history of noncompliance with medical treatment, presenting hazards to health    Prolapse of vaginal vault after hysterectomy    Proteinuria    Sickle-cell trait ([place])    Somatic dysfunction    Stroke ([place]) ~  08/2014   "blind in left eye; weak on left side since" (01/14/2015)   Type II diabetes mellitus ([place])    Venous insufficiency     Past Surgical History:  Procedure Laterality Date   ABDOMINAL HYSTERECTOMY     CARDIAC CATHETERIZATION N/A 01/14/2015   Procedure: Left Heart Cath and Coronary Angiography;  Surgeon: Charolette Forward, MD;  Location: [facility];  Service: Cardiovascular;  Laterality: N/A;   CARDIAC CATHETERIZATION  ~ 2011   CATARACT EXTRACTION W/ INTRAOCULAR LENS  IMPLANT, BILATERAL Bilateral 2012   CORONARY ANGIOPLASTY     DILATION AND CURETTAGE OF UTERUS  "probably"   LOOP RECORDER IMPLANT N/A 09/03/2014   Procedure: LOOP RECORDER IMPLANT;  Surgeon: Thompson Grayer, MD;  Location: [facility] C[facility];  Service: Cardiovascular;  Laterality: N/A;   ROBOTIC ASSISTED LAPAROSCOPIC SACROCOLPOPEXY  09/2009   Dr. Matilde Sprang   TEE WITHOUT CARDIOVERSION N/A 09/02/2014   Procedure: TRANSESOPHAGEAL ECHOCARDIOGRAM (TEE);  Surgeon: Lelon Perla, MD;  Location: [facility] ENDOSCOPY;  Service: Cardiovascular;  Laterality: N/A;    Soc Hx - married 53 years. Has 3 living children, one having died. She lives with her husband but her son checks on them regularly.    reports that she has never smoked. She has never used smokeless tobacco. She reports current alcohol use.  She reports that she does not use drugs.  Allergies  Allergen Reactions   Amlodipine Shortness Of Breath and Rash   Fish Allergy Hives   Penicillins Other (See Comments)    REACTION: red rash   Shellfish Allergy Hives   Peanuts [Peanut Oil] Rash   Tomato Rash    Family History  Problem Relation Age of Onset   Prostate cancer Father    Hypertension Father    Seizures Sister    Hypertension Sister    Bell's palsy Son    Heart attack Neg Hx    Stroke Neg Hx      Prior to Admission medications   Medication Sig Start Date End Date Taking? Authorizing Provider  atenolol (TENORMIN) 25 MG tablet TAKE 0.5 TABLETS (12.5 MG TOTAL) BY  MOUTH 2 (TWO) TIMES DAILY AFTER A MEAL. 04/07/20   Hoyt Koch, MD  atorvastatin (LIPITOR) 20 MG tablet Take 1 tablet (20 mg total) by mouth daily at 6 PM. 02/19/15   Hoyt Koch, MD  atropine 1 % ophthalmic solution PLACE 2 DROPS UNDER THE TONGUE 3 (THREE) TIMES DAILY. 04/09/20   Hoyt Koch, MD  blood glucose meter kit and supplies KIT Dispense what is covered by patients insurance with strips and lancets. Use twice a day (before breakfast and at bedtime). E11.40 01/26/19   Hoyt Koch, MD  cetirizine (ZYRTEC) 10 MG tablet Take 10 mg by mouth daily.    [provider]  clopidogrel (PLAVIX) 75 MG tablet Take 1 tablet (75 mg total) by mouth daily. OFFICE VISIT IS DUE 07/01/20   Hoyt Koch, MD  Cyanocobalamin (VITAMIN B-12) 2500 MCG SUBL Place 2,500 mcg under the tongue daily.    [provider]  glipiZIDE (GLUCOTROL XL) 2.5 MG 24 hr tablet Take 1 tablet (2.5 mg total) by mouth 2 (two) times daily. VISIT IS OVERDUE!!!!! 07/31/20   Hoyt Koch, MD  glucose blood test strip Use to check blood sugars twice daily 01/26/19   Hoyt Koch, MD  hydrALAZINE (APRESOLINE) 50 MG tablet Take 1 tablet (50 mg total) by mouth 2 (two) times daily. Annual appt due in July must see provider for future refills 11/20/19   Hoyt Koch, MD  isosorbide mononitrate (IMDUR) 60 MG 24 hr tablet Take 60 mg by mouth daily.  12/02/15   [provider]  JARDIANCE 10 MG TABS tablet TAKE 1 TABLET BY MOUTH EVERY DAY 02/23/19   Hoyt Koch, MD  levETIRAcetam (KEPPRA XR) 500 MG 24 hr tablet Take 1 tablet (500 mg total) by mouth daily. Patient needs to call our office to schedule a appointment for further refills 07/07/20   Hoyt Koch, MD  lisinopril (ZESTRIL) 20 MG tablet Take 1 tablet (20 mg total) by mouth daily. Needs office visit 06/23/20   Hoyt Koch, MD  nitroGLYCERIN (NITROSTAT) 0.4 MG SL tablet Place 0.4 mg  under the tongue every 5 (five) minutes as needed for chest pain.    [provider]  ONE TOUCH ULTRA TEST test strip USE TO TEST TWICE DAILY (BEFORE BREAKFAST, BEDTIME) E11.4 01/03/18   Hoyt Koch, MD  OneTouch Delica Lancets 78L MISC USE TO TEST BLOOD SUGAR TWICE DAILY 07/11/19   Hoyt Koch, MD  [facility] ULTRA test strip USE TO CHECK BLOOD SUGAR TWICE DAILY 12/12/20   Hoyt Koch, MD  polyethylene glycol (MIRALAX / GLYCOLAX) packet Take 17 g by mouth daily as needed for moderate constipation.  [provider]    Physical Exam: Vitals:   04/29/21 1700 04/29/21 1715 04/29/21 1745 04/29/21 1900  BP: 124/60 127/63 123/82 (!) 150/71  Pulse: (!) 34 (!) 38 (!) 58 (!) 27  Resp: $Remo'18 14 12 16  'zTugX$ Temp:      SpO2: 98% 97% 98% 98%     Vitals:   04/29/21 1700 04/29/21 1715 04/29/21 1745 04/29/21 1900  BP: 124/60 127/63 123/82 (!) 150/71  Pulse: (!) 34 (!) 38 (!) 58 (!) 27  Resp: $Remo'18 14 12 16  'dMxhy$ Temp:      SpO2: 98% 97% 98% 98%   General:  heavyset woman in no acute distress but with a wet cough Eyes: PERRL, lids and conjunctivae normal ENMT: Mucous membranes are moist. Posterior pharynx clear of any exudate or lesions. Neck: normal, supple, no masses, no thyromegaly Respiratory: clear to auscultation bilaterally, no wheezing, no crackles. Normal respiratory effort. No accessory muscle use. Wet cough noted Cardiovascular: on auscultation regular rate but on tele bradycardic - incomplete nodal transmission due to AV block. Question of soft systolic murmur at RSB Abdomen: obese, no tenderness, no masses palpated. No hepatosplenomegaly. Bowel sounds positive.  Musculoskeletal: no clubbing / cyanosis. No joint deformity upper and lower extremities. Good ROM, no contractures. Normal muscle tone.  Skin: no rashes, lesions, ulcers. No induration Neurologic: CN 2-12 grossly intact except HOH. Strength 5/5 in all 4.  Psychiatric: Normal judgment and insight.  Alert and oriented x 3. Normal mood.     Labs on Admission: I have personally reviewed following labs and imaging studies  CBC: Recent Labs  Lab 04/29/21 1437 04/29/21 1542  WBC 5.0  --   NEUTROABS 3.3  --   HGB 15.0 16.0*  HCT 47.2* 47.0*  MCV 91.5  --   PLT 226  --    Basic Metabolic Panel: Recent Labs  Lab 04/29/21 1437 04/29/21 1542  NA 139 144  K 4.6 4.4  CL 108 113*  CO2 20*  --   GLUCOSE 142* 138*  BUN 49* 48*  CREATININE 1.98* 2.10*  CALCIUM 8.7*  --   MG 2.6*  --    GFR: CrCl cannot be calculated (Unknown ideal weight.). Liver Function Tests: Recent Labs  Lab 04/29/21 1437  AST 14*  ALT 11  ALKPHOS 86  BILITOT 0.9  PROT 7.2  ALBUMIN 3.2*   No results for input(s): LIPASE, AMYLASE in the last 168 hours. No results for input(s): AMMONIA in the last 168 hours. Coagulation Profile: No results for input(s): INR, PROTIME in the last 168 hours. Cardiac Enzymes: No results for input(s): CKTOTAL, CKMB, CKMBINDEX, TROPONINI in the last 168 hours. BNP (last 3 results) No results for input(s): PROBNP in the last 8760 hours. HbA1C: No results for input(s): HGBA1C in the last 72 hours. CBG: No results for input(s): GLUCAP in the last 168 hours. Lipid Profile: No results for input(s): CHOL, HDL, LDLCALC, TRIG, CHOLHDL, LDLDIRECT in the last 72 hours. Thyroid Function Tests: Recent Labs    04/29/21 1437  TSH 1.546  FREET4 0.98   Anemia Panel: No results for input(s): VITAMINB12, FOLATE, FERRITIN, TIBC, IRON, RETICCTPCT in the last 72 hours. Urine analysis:    Component Value Date/Time   COLORURINE YELLOW 06/19/2016 2357   APPEARANCEUR HAZY (A) 06/19/2016 2357   LABSPEC 1.011 06/19/2016 2357   PHURINE 5.0 06/19/2016 2357   GLUCOSEU 150 (A) 06/19/2016 2357   GLUCOSEU NEGATIVE 02/25/2016 [place] NEGATIVE 06/19/2016 2357   BILIRUBINUR neg 02/03/2017  [place] 06/19/2016 2357   PROTEINUR neg 02/03/2017 1132   PROTEINUR >=300  (A) 06/19/2016 2357   UROBILINOGEN 0.2 02/03/2017 1132   UROBILINOGEN 0.2 02/25/2016 0949   NITRITE neg 02/03/2017 1132   NITRITE NEGATIVE 06/19/2016 2357   LEUKOCYTESUR Negative 02/03/2017 1132    Radiological Exams on Admission: DG Chest Portable 1 View  Result Date: 04/29/2021 CLINICAL DATA:  80 year old female with history of weakness. EXAM: PORTABLE CHEST 1 VIEW COMPARISON:  Chest x-ray 06/19/2016. FINDINGS: Transcutaneous defibrillator pad projecting over the left hemithorax. Lung volumes are low. No consolidative airspace disease. No pleural effusions. No pneumothorax. No evidence of pulmonary edema. Heart size is normal. Mediastinal contours are distorted by patient positioning. Atherosclerotic calcifications in the thoracic aorta. IMPRESSION: 1. Low lung volumes without radiographic evidence of acute cardiopulmonary disease. 2. Aortic atherosclerosis. Electronically Signed   By: Vinnie Langton M.D.   On: 04/29/2021 15:23    EKG: Independently reviewed. 2:1 AV block, LBBB  Assessment/Plan Active Problems:   2nd degree AV block   COVID-19 virus infection   Essential hypertension   Type 2 diabetes mellitus with diabetic neuropathy (HCC)   CAD (coronary artery disease)   CKD (chronic kidney disease) stage 3, GFR 30-59 ml/min (HCC)   Hyperlipidemia associated with type 2 diabetes mellitus ([place])   Seizure as late effect of cerebrovascular accident (CVA) (HCC)   AV block, 2nd degree  Cardiac - AV block. Cardiology has seen patient and will follow Plan Cardiac tele admit  Hold BB, continue other meds  2. Covid 19 infection - tested positive at home 04/19/21. Presents with weakness and wet cough but nl O2 level, clear CXR. Tested positive in ED today. Question of active infection Plan  Pharmacy consult re: remdesivir   3. DM - no recent A1C in system. Son report home CBGs mostly ok. Plan Continue Jardiance, hold glipizide  A1C  SS coverage  4. CKD - last Cr in system 1.49 two  years ago, now worse. Possibly prerenal vs progressive diabetic nephropathy Plan Hold nephrotoxic meds  Hydrate  Recheck Bmet in AM  5. HLD - continue home meds  6. Post-CVA seizure disorder - continue home Keppra dose  7. HTN- BP stable. Lisinopril on hold. Cardiology recommends permissive BP Plan Continue home BP meds except lisinopril DVT prophylaxis: lovenox  Code Status: full code  Family Communication: son present during exam. Requests to be allowed to stay with her due to h/o CVA and loss of hearing.  Disposition Plan: home when stable  Consults called: Cardiology - Dr. Burt Knack.  Admission status: inpatient-tele    Adella Hare MD [facility] Pager [phone]  If 7PM-7AM, please contact night-coverage [email or web address] Password [email or web address]  04/29/2021, 7:09 PM

## 2021-04-30 ENCOUNTER — Other Ambulatory Visit: Payer: Self-pay

## 2021-04-30 ENCOUNTER — Inpatient Hospital Stay (HOSPITAL_COMMUNITY): Payer: Medicare (Managed Care)

## 2021-04-30 ENCOUNTER — Encounter (HOSPITAL_COMMUNITY): Payer: Self-pay | Admitting: Internal Medicine

## 2021-04-30 DIAGNOSIS — E785 Hyperlipidemia, unspecified: Secondary | ICD-10-CM

## 2021-04-30 DIAGNOSIS — E1169 Type 2 diabetes mellitus with other specified complication: Secondary | ICD-10-CM

## 2021-04-30 DIAGNOSIS — I441 Atrioventricular block, second degree: Secondary | ICD-10-CM

## 2021-04-30 DIAGNOSIS — I2583 Coronary atherosclerosis due to lipid rich plaque: Secondary | ICD-10-CM

## 2021-04-30 DIAGNOSIS — I2510 Atherosclerotic heart disease of native coronary artery without angina pectoris: Secondary | ICD-10-CM

## 2021-04-30 DIAGNOSIS — E440 Moderate protein-calorie malnutrition: Secondary | ICD-10-CM | POA: Insufficient documentation

## 2021-04-30 DIAGNOSIS — N1832 Chronic kidney disease, stage 3b: Secondary | ICD-10-CM

## 2021-04-30 LAB — BASIC METABOLIC PANEL
Anion gap: 10 (ref 5–15)
BUN: 42 mg/dL — ABNORMAL HIGH (ref 8–23)
CO2: 19 mmol/L — ABNORMAL LOW (ref 22–32)
Calcium: 8.7 mg/dL — ABNORMAL LOW (ref 8.9–10.3)
Chloride: 111 mmol/L (ref 98–111)
Creatinine, Ser: 1.64 mg/dL — ABNORMAL HIGH (ref 0.44–1.00)
GFR, Estimated: 31 mL/min — ABNORMAL LOW (ref >60)
Glucose, Bld: 101 mg/dL — ABNORMAL HIGH (ref 70–99)
Potassium: 4.4 mmol/L (ref 3.5–5.1)
Sodium: 140 mmol/L (ref 135–145)

## 2021-04-30 LAB — GLUCOSE, CAPILLARY
Glucose-Capillary: 199 mg/dL — ABNORMAL HIGH (ref 70–99)
Glucose-Capillary: 163 mg/dL — ABNORMAL HIGH (ref 70–99)
Glucose-Capillary: 222 mg/dL — ABNORMAL HIGH (ref 70–99)

## 2021-04-30 LAB — ECHOCARDIOGRAM COMPLETE
AV Mean grad: 7 mmHg
AV Peak grad: 14 mmHg
Ao pk vel: 1.87 m/s
Area-P 1/2: 4.63 cm2
Height: 66.5 in
S' Lateral: 2.2 cm
Weight: 2670.21 oz

## 2021-04-30 LAB — HEMOGLOBIN A1C
Hgb A1c MFr Bld: 8.7 % — ABNORMAL HIGH (ref 4.8–5.6)
Mean Plasma Glucose: 202.99 mg/dL

## 2021-04-30 MED ORDER — DM-GUAIFENESIN ER 30-600 MG PO TB12
1.0000 | ORAL_TABLET | Freq: Two times a day (BID) | ORAL | Status: DC
Start: 2021-04-30 — End: 2021-05-01
  Administered 2021-04-30 – 2021-05-01 (×2): 1 via ORAL
  Filled 2021-04-30 (×2): qty 1

## 2021-04-30 MED ORDER — ENSURE ENLIVE PO LIQD
237.0000 mL | Freq: Two times a day (BID) | ORAL | Status: DC
  Administered 2021-04-30 – 2021-05-01 (×3): 237 mL via ORAL

## 2021-04-30 MED ORDER — ADULT MULTIVITAMIN W/MINERALS CH
1.0000 | ORAL_TABLET | Freq: Every day | ORAL | Status: DC
  Administered 2021-04-30 – 2021-05-01 (×2): 1 via ORAL
  Filled 2021-04-30 (×2): qty 1

## 2021-04-30 MED ORDER — LEVETIRACETAM 500 MG PO TABS
500.0000 mg | ORAL_TABLET | Freq: Every day | ORAL | Status: DC
  Administered 2021-04-30: 500 mg via ORAL
  Filled 2021-04-30: qty 1

## 2021-04-30 NOTE — Progress Notes (Signed)
Patient arrived to the floor with her son and the Ed RN via the stretcher, alert, patient is transferred to bed, CHG bath provided, VS obtained cardiac monitoring initiated. We'll continue to monitor.

## 2021-04-30 NOTE — Progress Notes (Signed)
Primary Cardiologist:  Radford Pax   Subjective:  Denies SSCP, palpitations or Dyspnea Son had slept in room   Objective:  Vitals:   04/29/21 2217 04/30/21 0020 04/30/21 0446 04/30/21 0759  BP: (!) 156/61 (!) 178/61 (!) 155/88 (!) 142/90  Pulse:  60 (!) 58 79  Resp:  15 16 15   Temp:  98.5 F (36.9 C) 98.3 F (36.8 C) 98 F (36.7 C)  TempSrc:  Oral Oral Oral  SpO2:  98% 99% 97%  Weight: 81.2 kg 75.7 kg    Height: 5' 6.5" (1.689 m)       Intake/Output from previous day:  Intake/Output Summary (Last 24 hours) at 04/30/2021 0806 Last data filed at 04/30/2021 0417 Gross per 24 hour  Intake 1092.5 ml  Output --  Net 1092.5 ml    Physical Exam: Affect appropriate Elderly black female  HEENT: normal Neck supple with no adenopathy JVP normal no bruits no thyromegaly Lungs clear with no wheezing and good diaphragmatic motion Heart:  S1/S2 no murmur, no rub, gallop or click PMI normal Abdomen: benighn, BS positve, no tenderness, no AAA no bruit.  No HSM or HJR Distal pulses intact with no bruits No edema Neuro non-focal Skin warm and dry No muscular weakness   Lab Results: Basic Metabolic Panel: Recent Labs    04/29/21 1437 04/29/21 1542 04/30/21 0149  NA 139 144 140  K 4.6 4.4 4.4  CL 108 113* 111  CO2 20*  --  19*  GLUCOSE 142* 138* 101*  BUN 49* 48* 42*  CREATININE 1.98* 2.10* 1.64*  CALCIUM 8.7*  --  8.7*  MG 2.6*  --   --    Liver Function Tests: Recent Labs    04/29/21 1437  AST 14*  ALT 11  ALKPHOS 86  BILITOT 0.9  PROT 7.2  ALBUMIN 3.2*   No results for input(s): LIPASE, AMYLASE in the last 72 hours. CBC: Recent Labs    04/29/21 1437 04/29/21 1542  WBC 5.0  --   NEUTROABS 3.3  --   HGB 15.0 16.0*  HCT 47.2* 47.0*  MCV 91.5  --   PLT 226  --    Cardiac Enzymes: No results for input(s): CKTOTAL, CKMB, CKMBINDEX, TROPONINI in the last 72 hours. BNP: Invalid input(s): POCBNP D-Dimer: No results for input(s): DDIMER in the last  72 hours. Hemoglobin A1C: Recent Labs    04/30/21 0149  HGBA1C 8.7*   Fasting Lipid Panel: No results for input(s): CHOL, HDL, LDLCALC, TRIG, CHOLHDL, LDLDIRECT in the last 72 hours. Thyroid Function Tests: Recent Labs    04/29/21 1437  TSH 1.546   Anemia Panel: No results for input(s): VITAMINB12, FOLATE, FERRITIN, TIBC, IRON, RETICCTPCT in the last 72 hours.  Imaging: DG Chest Portable 1 View  Result Date: 04/29/2021 CLINICAL DATA:  80 year old female with history of weakness. EXAM: PORTABLE CHEST 1 VIEW COMPARISON:  Chest x-ray 06/19/2016. FINDINGS: Transcutaneous defibrillator pad projecting over the left hemithorax. Lung volumes are low. No consolidative airspace disease. No pleural effusions. No pneumothorax. No evidence of pulmonary edema. Heart size is normal. Mediastinal contours are distorted by patient positioning. Atherosclerotic calcifications in the thoracic aorta. IMPRESSION: 1. Low lung volumes without radiographic evidence of acute cardiopulmonary disease. 2. Aortic atherosclerosis. Electronically Signed   By: Vinnie Langton M.D.   On: 04/29/2021 15:23    Cardiac Studies:  ECG: 2:1 AV block rate 39 LBBB    Telemetry:  NSR rate 60's rare 2;1 block  Echo: pending  Medications:    atorvastatin  20 mg Oral q1800   clopidogrel  75 mg Oral Daily   empagliflozin  10 mg Oral Daily   enoxaparin (LOVENOX) injection  30 mg Subcutaneous Q24H   feeding supplement  237 mL Oral BID BM   hydrALAZINE  50 mg Oral BID   insulin aspart  0-15 Units Subcutaneous TID WC   loratadine  10 mg Oral Daily      sodium chloride     lactated ringers 100 mL/hr at 04/29/21 1614    Assessment/Plan:   Bradycardia:  2:1 AV block on atenolol Improved 24 hours into wash out NSR in mid to upper 60's this am with rare dropped P waves and no prolonged 2:1 block like yesterday Will order echo Myovue 2020 with no ischemia and no chest pain Troponin negative TSH normal and K/Cr ok 2. DM:   Discussed low carb diet.  Target hemoglobin A1c is 6.5 or less.  Continue current medications. 3. HLD:  continue statin  4. HTN  continue hydralazine   Would consider 14 day outpatient monitor on d/c if she does not need PPM   Jenkins Rouge 04/30/2021, 8:06 AM

## 2021-04-30 NOTE — Progress Notes (Addendum)
Initial Nutrition Assessment  DOCUMENTATION CODES:   Non-severe (moderate) malnutrition in context of chronic illness  INTERVENTION:  - Continue Ensure Enlive po BID, each supplement provides 350 kcal and 20 grams of protein  - MVI with minerals daily  - Recommend liberalizing from heart healthy/carb modified diet to just carb modified to provide more options for PO intake and encourage adequate PO intake  - Provided Plate Method handout in AVS  NUTRITION DIAGNOSIS:   Moderate Malnutrition related to chronic illness (CKD3, T2DM) as evidenced by mild fat depletion, moderate muscle depletion.  GOAL:   Patient will meet greater than or equal to 90% of their needs  MONITOR:   PO intake, Supplement acceptance, Weight trends, Labs  REASON FOR ASSESSMENT:   Malnutrition Screening Tool    ASSESSMENT:   Pt admitted with weakness and decreased appetite. COVID+. PMH includes CAD s/p PCI/stenting, CKD 3, HTN, hyperlipidemia, T2DM, sickle cell trait, GERD, breast cancer, hx of CVA and medical noncompliance.  Per cardiology, possible need for PPM if no improvement in bradycardia.  Pt with son at bedside. She reports poor appetite and poor PO intake x4 days. She states that with COVID her taste has changed some. Prior to these past 4 days, she was eating jimmy dean breakfast bowls and coffee for breakfast, nothing for lunch, and a meal from church that he husband would pick up that consisted of a meat and 2 vegetables. She reports that her appetite is beginning to improve and had most of her breakfast this morning. Her son states that she likes to suck on ice throughout the day so when stopped, he knew she wasn't feeling well. At home she does not use insulin. Just managing her diabetes with Jardiance. Her son reports that she checks her blood sugars daily and that recently have been running lower but that they have been elevated prior to her being sick. Per chart review, noted history of  medical non-compliance. This could possibly be contributing to elevated blood sugar and higher A1C. She also takes vitamin B12 and Vitamin E. She is receiving the Ensure and is willing to continue to receive them during admission. Spoke with MD about diet liberalization to encourage adequate PO intake d/t poor intake and malnutrition.  At home she does not need a mobility device however outside of the home, she uses a cane to get around. She reports that her weight 3-4 weeks ago was 174 lbs and denies any recent weight loss. Unfortunately, there is limited weight history documentation. Current weight documentation during admission is noted to be 167 lbs. This is a 4% weight loss within 1 month..  Medications: jardiance, SSI  Labs: BUN 42, Cr 1.64, HgbA1C 8.7   NUTRITION - FOCUSED PHYSICAL EXAM:  Flowsheet Row Most Recent Value  Orbital Region Mild depletion  Upper Arm Region No depletion  Thoracic and Lumbar Region Mild depletion  Buccal Region No depletion  Temple Region Moderate depletion  Clavicle Bone Region No depletion  Clavicle and Acromion Bone Region Mild depletion  Scapular Bone Region No depletion  Dorsal Hand Mild depletion  Patellar Region Moderate depletion  Anterior Thigh Region Moderate depletion  Posterior Calf Region Moderate depletion  Edema (RD Assessment) None  Hair Reviewed  Eyes Reviewed  Mouth Reviewed  Skin Reviewed  Nails Other (Comment)  [slow capillary refill]       Diet Order:   Diet Order             Diet heart healthy/carb modified Room  service appropriate? Yes; Fluid consistency: Thin  Diet effective now                   EDUCATION NEEDS:   Education needs have been addressed  Skin:  Skin Assessment: Reviewed RN Assessment  Last BM:  PTA  Height:   Ht Readings from Last 1 Encounters:  04/29/21 5' 6.5" (1.689 m)    Weight:   Wt Readings from Last 1 Encounters:  04/30/21 75.7 kg    BMI:  Body mass index is 26.53  kg/m.  Estimated Nutritional Needs:   Kcal:  1600-1800  Protein:  90-100g  Fluid:  >1.6L  Clayborne Dana, RDN, LDN Clinical Nutrition

## 2021-04-30 NOTE — Progress Notes (Signed)
  Echocardiogram 2D Echocardiogram has been performed.  Kayla [name] 04/30/2021, 5:38 PM

## 2021-04-30 NOTE — Discharge Instructions (Signed)

## 2021-04-30 NOTE — Progress Notes (Signed)
PROGRESS NOTE  Kayla [name]  ZOX:[id number] DOB: 11-08-1940 DOA: 04/29/2021 PCP: Cipriano Mile, NP   Brief Narrative: Kayla [name] is an 80 y.o. female with a history of CAD s/p stenting D1, and LAD July 2016, T2DM (HbA1c 8.7%), HTN, HLD, CVA complicated by seizure disorder, breast CA, sickle cell trait, and covid-19 infection diagnosed 04/19/2021 who presented to the ED 11/9 with fatigue and weakness, loss of appetite worsening for a few days found to be in 2:1 2nd degree AVB with rate in 30's, LBBB, and preserved BP. Troponin negative x2. SCr 2.1 from possible baseline of 1.4. SARS-CoV-2 PCR was performed and weakly positive (CT 31) though she has no dyspnea or infiltrate on CXR. TFTs normal. Atenolol was held, cardiology recommended admission for monitoring while letting atenolol washout.  Assessment & Plan: Active Problems:   Hyperlipidemia associated with type 2 diabetes mellitus ([place])   Essential hypertension   Type 2 diabetes mellitus with diabetic neuropathy (HCC)   Seizure as late effect of cerebrovascular accident (CVA) ([place])   CAD (coronary artery disease)   2nd degree AV block   CKD (chronic kidney disease) stage 3, GFR 30-59 ml/min (HCC)   COVID-19 virus infection   AV block, 2nd degree   Malnutrition of moderate degree  2:1 2nd degree AV block: Improving conduction block only rarely, now sinus bradycardia. TFTs wnl.  - Cardiology guiding treatment, allowing atenolol washout. Considering pacemaker insertion vs. outpatient monitoring. - Echocardiogram ordered.  Covid-19 infection: Diagnosed 04/19/2021 without evidence of pneumonia. Pt is afebrile, and past 10 days isolation period. No further treatment is needed, presenting outside window of benefit with low cycle threshold. Remdesivir may worsen bradycardia as well. Note normal LFTs, platelets, and lymphocyte counts.   CAD s/p stents 2016 without angina: LBBB on ECG, troponins negative.  - Continue plavix, statin, hold BB  as above  T2DM: Poorly controlled with HbA1c 8.7%. Glucosuria and ketonuria noted though no elevation in anion gap and glucose not elevated terribly.  - Hold empagliflozin while CrCl <49ml/min, hold [facility]. - Continue SSI, has been at inpatient goal.   AKI on stage IIIb CKD: Improving, though baseline unclear (AKI vs. progressive diabetic nephropathy). - Monitor metabolic panel, hydrate holding ACE as below. - Continue gentle hydration, not overloaded.   HTN:  - Continue hydralazine.  - Holding lisinopril to prevent hypoperfusion and facilitate renal autoregulation.  HLD:  - Continue statin  History of CVA, ensuing seizure disorder:  - Continue antiplatelet, statin, RF modification as above, and AED.   Moderate protein-calorie malnutrition:  - Supplement protein as able  DVT prophylaxis: Lovenox 30mg  q24h Code Status: Full Family Communication: Son at bedside Disposition Plan:  Status is: Inpatient  Remains inpatient appropriate because: Ongoing management of severe bradycardia  Consultants:  Cardiology  Procedures:  None  Antimicrobials: None   Subjective: Does have a cough that is slightly better. No chest pain or fever. Appetite coming back since arrival. No palpitations.   Objective: Vitals:   04/30/21 0020 04/30/21 0446 04/30/21 0759 04/30/21 1100  BP: (!) 178/61 (!) 155/88 (!) 142/90 (!) 144/66  Pulse: 60 (!) 58 79 (!) 54  Resp: 15 16 15 20   Temp: 98.5 F (36.9 C) 98.3 F (36.8 C) 98 F (36.7 C) 97.6 F (36.4 C)  TempSrc: Oral Oral Oral Oral  SpO2: 98% 99% 97% 98%  Weight: 75.7 kg     Height:        Intake/Output Summary (Last 24 hours) at 04/30/2021 1454 Last data  filed at 04/30/2021 0417 Gross per 24 hour  Intake 1092.5 ml  Output --  Net 1092.5 ml   Filed Weights   04/29/21 2217 04/30/21 0020  Weight: 81.2 kg 75.7 kg    Gen: Elderly pleasant female in no distress Pulm: Non-labored breathing room air. Clear to auscultation bilaterally.   CV: Regular bradycardia in 50's with rare dropped p waves on monitor. No murmur, rub, or gallop. No JVD, no pitting pedal edema. GI: Abdomen soft, non-tender, non-distended, with normoactive bowel sounds. No organomegaly or masses felt. Ext: Warm, no deformities Skin: No rashes, lesions or ulcers on visualized skin. Neuro: Alert and oriented. No focal neurological deficits. Psych: Judgement and insight appear normal. Mood & affect appropriate.   Data Reviewed: I have personally reviewed following labs and imaging studies  CBC: Recent Labs  Lab 04/29/21 1437 04/29/21 1542  WBC 5.0  --   NEUTROABS 3.3  --   HGB 15.0 16.0*  HCT 47.2* 47.0*  MCV 91.5  --   PLT 226  --    Basic Metabolic Panel: Recent Labs  Lab 04/29/21 1437 04/29/21 1542 04/30/21 0149  NA 139 144 140  K 4.6 4.4 4.4  CL 108 113* 111  CO2 20*  --  19*  GLUCOSE 142* 138* 101*  BUN 49* 48* 42*  CREATININE 1.98* 2.10* 1.64*  CALCIUM 8.7*  --  8.7*  MG 2.6*  --   --    GFR: Estimated Creatinine Clearance: 28.3 mL/min (A) (by C-G formula based on SCr of 1.64 mg/dL (H)). Liver Function Tests: Recent Labs  Lab 04/29/21 1437  AST 14*  ALT 11  ALKPHOS 86  BILITOT 0.9  PROT 7.2  ALBUMIN 3.2*   No results for input(s): LIPASE, AMYLASE in the last 168 hours. No results for input(s): AMMONIA in the last 168 hours. Coagulation Profile: No results for input(s): INR, PROTIME in the last 168 hours. Cardiac Enzymes: No results for input(s): CKTOTAL, CKMB, CKMBINDEX, TROPONINI in the last 168 hours. BNP (last 3 results) No results for input(s): PROBNP in the last 8760 hours. HbA1C: Recent Labs    04/30/21 0149  HGBA1C 8.7*   CBG: Recent Labs  Lab 04/30/21 1112  GLUCAP 199*   Lipid Profile: No results for input(s): CHOL, HDL, LDLCALC, TRIG, CHOLHDL, LDLDIRECT in the last 72 hours. Thyroid Function Tests: Recent Labs    04/29/21 1437  TSH 1.546  FREET4 0.98   Anemia Panel: No results for  input(s): VITAMINB12, FOLATE, FERRITIN, TIBC, IRON, RETICCTPCT in the last 72 hours. Urine analysis:    Component Value Date/Time   COLORURINE YELLOW 04/29/2021 1946   APPEARANCEUR HAZY (A) 04/29/2021 1946   LABSPEC 1.013 04/29/2021 1946   PHURINE 5.0 04/29/2021 1946   GLUCOSEU >=500 (A) 04/29/2021 1946   GLUCOSEU NEGATIVE 02/25/2016 0949   HGBUR NEGATIVE 04/29/2021 1946   BILIRUBINUR NEGATIVE 04/29/2021 1946   BILIRUBINUR neg 02/03/2017 1132   KETONESUR 5 (A) 04/29/2021 1946   PROTEINUR NEGATIVE 04/29/2021 1946   UROBILINOGEN 0.2 02/03/2017 1132   UROBILINOGEN 0.2 02/25/2016 0949   NITRITE NEGATIVE 04/29/2021 1946   LEUKOCYTESUR SMALL (A) 04/29/2021 1946   Recent Results (from the past 240 hour(s))  Resp Panel by RT-PCR (Flu A&B, Covid) Nasopharyngeal Swab     Status: Abnormal   Collection Time: 04/29/21  3:16 PM   Specimen: Nasopharyngeal Swab; Nasopharyngeal(NP) swabs in vial transport medium  Result Value Ref Range Status   SARS Coronavirus 2 by RT PCR POSITIVE (A) NEGATIVE Final  Comment: RESULT CALLED TO, READ BACK BY AND VERIFIED WITH: CHRIS CRISCO RN 04/29/2021 @1846  BY JW (NOTE) SARS-CoV-2 target nucleic acids are DETECTED.  The SARS-CoV-2 RNA is generally detectable in upper respiratory specimens during the acute phase of infection. Positive results are indicative of the presence of the identified virus, but do not rule out bacterial infection or co-infection with other pathogens not detected by the test. Clinical correlation with patient history and other diagnostic information is necessary to determine patient infection status. The expected result is Negative.  Fact Sheet for Patients: EntrepreneurPulse.com.au  Fact Sheet for Healthcare Providers: IncredibleEmployment.be  This test is not yet approved or cleared by the [place] FDA and  has been authorized for detection and/or diagnosis of SARS-CoV-2 by FDA under an  Emergency Use Authorization (EUA).  This EUA will remain in effect (meaning this test ca n be used) for the duration of  the COVID-19 declaration under Section 564(b)(1) of the Act, 21 U.S.C. section 360bbb-3(b)(1), unless the authorization is terminated or revoked sooner.     Influenza A by PCR NEGATIVE NEGATIVE Final   Influenza B by PCR NEGATIVE NEGATIVE Final    Comment: (NOTE) The Xpert Xpress SARS-CoV-2/FLU/RSV plus assay is intended as an aid in the diagnosis of influenza from Nasopharyngeal swab specimens and should not be used as a sole basis for treatment. Nasal washings and aspirates are unacceptable for Xpert Xpress SARS-CoV-2/FLU/RSV testing.  Fact Sheet for Patients: EntrepreneurPulse.com.au  Fact Sheet for Healthcare Providers: IncredibleEmployment.be  This test is not yet approved or cleared by the [place] FDA and has been authorized for detection and/or diagnosis of SARS-CoV-2 by FDA under an Emergency Use Authorization (EUA). This EUA will remain in effect (meaning this test can be used) for the duration of the COVID-19 declaration under Section 564(b)(1) of the Act, 21 U.S.C. section 360bbb-3(b)(1), unless the authorization is terminated or revoked.  Performed at [facility], [place] [address] [place], [place] [address]       Radiology Studies: DG Chest Portable 1 View  Result Date: 04/29/2021 CLINICAL DATA:  80 year old female with history of weakness. EXAM: PORTABLE CHEST 1 VIEW COMPARISON:  Chest x-ray 06/19/2016. FINDINGS: Transcutaneous defibrillator pad projecting over the left hemithorax. Lung volumes are low. No consolidative airspace disease. No pleural effusions. No pneumothorax. No evidence of pulmonary edema. Heart size is normal. Mediastinal contours are distorted by patient positioning. Atherosclerotic calcifications in the thoracic aorta. IMPRESSION: 1. Low lung volumes without radiographic evidence  of acute cardiopulmonary disease. 2. Aortic atherosclerosis. Electronically Signed   By: Vinnie Langton M.D.   On: 04/29/2021 15:23    Scheduled Meds:  atorvastatin  20 mg Oral q1800   clopidogrel  75 mg Oral Daily   empagliflozin  10 mg Oral Daily   enoxaparin (LOVENOX) injection  30 mg Subcutaneous Q24H   feeding supplement  237 mL Oral BID BM   hydrALAZINE  50 mg Oral BID   insulin aspart  0-15 Units Subcutaneous TID WC   loratadine  10 mg Oral Daily   multivitamin with minerals  1 tablet Oral Daily   Continuous Infusions:  sodium chloride 50 mL/hr at 04/30/21 1200   lactated ringers 100 mL/hr at 04/29/21 1614     LOS: 1 day   Time spent: 25 minutes.  Patrecia Pour, MD [facility] [email or web address] 04/30/2021, 2:54 PM

## 2021-04-30 NOTE — Plan of Care (Signed)
POC initiated and progressing. 

## 2021-05-01 LAB — BASIC METABOLIC PANEL
Anion gap: 10 (ref 5–15)
BUN: 37 mg/dL — ABNORMAL HIGH (ref 8–23)
CO2: 20 mmol/L — ABNORMAL LOW (ref 22–32)
Calcium: 8.5 mg/dL — ABNORMAL LOW (ref 8.9–10.3)
Chloride: 105 mmol/L (ref 98–111)
Creatinine, Ser: 1.56 mg/dL — ABNORMAL HIGH (ref 0.44–1.00)
GFR, Estimated: 33 mL/min — ABNORMAL LOW (ref >60)
Glucose, Bld: 155 mg/dL — ABNORMAL HIGH (ref 70–99)
Potassium: 4 mmol/L (ref 3.5–5.1)
Sodium: 135 mmol/L (ref 135–145)

## 2021-05-01 LAB — GLUCOSE, CAPILLARY: Glucose-Capillary: 146 mg/dL — ABNORMAL HIGH (ref 70–99)

## 2021-05-01 MED ORDER — INFLUENZA VAC A&B SA ADJ QUAD 0.5 ML IM PRSY: 0.5000 mL | PREFILLED_SYRINGE | INTRAMUSCULAR | Status: DC

## 2021-05-01 NOTE — Progress Notes (Signed)
Discharge instructions (including medications) discussed with and copy provided to patient/caregiver 

## 2021-05-01 NOTE — Progress Notes (Signed)
Progress Note  Patient Name: Kayla [name] Date of Encounter: 05/01/2021  [facility] Cardiologist: Fransico Him, MD   Subjective   No chest pain or shortness of breath.  Feeling fine this morning.  Inpatient Medications    Scheduled Meds:  atorvastatin  20 mg Oral q1800   clopidogrel  75 mg Oral Daily   dextromethorphan-guaiFENesin  1 tablet Oral BID   enoxaparin (LOVENOX) injection  30 mg Subcutaneous Q24H   feeding supplement  237 mL Oral BID BM   hydrALAZINE  50 mg Oral BID   [START ON 05/02/2021] influenza vaccine adjuvanted  0.5 mL Intramuscular Tomorrow-1000   insulin aspart  0-15 Units Subcutaneous TID WC   levETIRAcetam  500 mg Oral QHS   loratadine  10 mg Oral Daily   multivitamin with minerals  1 tablet Oral Daily   Continuous Infusions:  sodium chloride 50 mL/hr at 05/01/21 0510   PRN Meds: acetaminophen **OR** acetaminophen, nitroGLYCERIN, polyethylene glycol, traZODone   Vital Signs    Vitals:   04/30/21 1952 04/30/21 2316 05/01/21 0330 05/01/21 0717  BP: (!) 159/70 139/84 (!) 151/98 128/76  Pulse: (!) 45 (!) 41 (!) 43 96  Resp: 17 16 19 20   Temp: 98.8 F (37.1 C) 97.8 F (36.6 C) 97.6 F (36.4 C) 97.8 F (36.6 C)  TempSrc: Oral Oral Oral Oral  SpO2: 98% 97% 97% 91%  Weight:      Height:        Intake/Output Summary (Last 24 hours) at 05/01/2021 0856 Last data filed at 05/01/2021 0510 Gross per 24 hour  Intake 878.92 ml  Output 1700 ml  Net -821.08 ml   Last 3 Weights 04/30/2021 04/29/2021 05/23/2019  Weight (lbs) 166 lb 14.2 oz 179 lb 0.2 oz 179 lb  Weight (kg) 75.7 kg 81.2 kg 81.194 kg      Telemetry    Sinus rhythm with one-to-one conduction, heart rate 90 bpm, episodic nonconducted P waves consistent with Mobitz 2 AV block - Personally Reviewed    Physical Exam  Alert, pleasant, elderly woman in no distress GEN: No acute distress.   Neck: No JVD Cardiac: RRR, no murmurs, rubs, or gallops.  Respiratory: Clear to  auscultation bilaterally. GI: Soft, nontender, non-distended  MS: No edema; No deformity. Neuro:  Nonfocal  Psych: Normal affect   Labs    High Sensitivity Troponin:   Recent Labs  Lab 04/29/21 1437 04/29/21 1654  TROPONINIHS 11 9     Chemistry Recent Labs  Lab 04/29/21 1437 04/29/21 1542 04/30/21 0149 05/01/21 0159  NA 139 144 140 135  K 4.6 4.4 4.4 4.0  CL 108 113* 111 105  CO2 20*  --  19* 20*  GLUCOSE 142* 138* 101* 155*  BUN 49* 48* 42* 37*  CREATININE 1.98* 2.10* 1.64* 1.56*  CALCIUM 8.7*  --  8.7* 8.5*  MG 2.6*  --   --   --   PROT 7.2  --   --   --   ALBUMIN 3.2*  --   --   --   AST 14*  --   --   --   ALT 11  --   --   --   ALKPHOS 86  --   --   --   BILITOT 0.9  --   --   --   GFRNONAA 25*  --  31* 33*  ANIONGAP 11  --  10 10    Lipids No results for input(s): CHOL, TRIG,  HDL, LABVLDL, LDLCALC, CHOLHDL in the last 168 hours.  Hematology Recent Labs  Lab 04/29/21 1437 04/29/21 1542  WBC 5.0  --   RBC 5.16*  --   HGB 15.0 16.0*  HCT 47.2* 47.0*  MCV 91.5  --   MCH 29.1  --   MCHC 31.8  --   RDW 17.3*  --   PLT 226  --    Thyroid  Recent Labs  Lab 04/29/21 1437  TSH 1.546  FREET4 0.98    BNPNo results for input(s): BNP, PROBNP in the last 168 hours.  DDimer No results for input(s): DDIMER in the last 168 hours.   Radiology    DG Chest Portable 1 View  Result Date: 04/29/2021 CLINICAL DATA:  80 year old female with history of weakness. EXAM: PORTABLE CHEST 1 VIEW COMPARISON:  Chest x-ray 06/19/2016. FINDINGS: Transcutaneous defibrillator pad projecting over the left hemithorax. Lung volumes are low. No consolidative airspace disease. No pleural effusions. No pneumothorax. No evidence of pulmonary edema. Heart size is normal. Mediastinal contours are distorted by patient positioning. Atherosclerotic calcifications in the thoracic aorta. IMPRESSION: 1. Low lung volumes without radiographic evidence of acute cardiopulmonary disease. 2. Aortic  atherosclerosis. Electronically Signed   By: Vinnie Langton M.D.   On: 04/29/2021 15:23   ECHOCARDIOGRAM COMPLETE  Result Date: 04/30/2021    ECHOCARDIOGRAM REPORT   Patient Name:   STEFANIE HODGENS Date of Exam: 04/30/2021 Medical Rec #:  [id number]      Height:       66.5 in Accession #:    [id number]     Weight:       166.9 lb Date of Birth:  31-Jan-1941      BSA:          1.862 m Patient Age:    61 years       BP:           148/57 mmHg Patient Gender: F              HR:           47 bpm. Exam Location:  Inpatient Procedure: 2D Echo Indications:    second degree heart block  History:        Patient has prior history of Echocardiogram examinations, most                 recent 09/02/2014. CAD, Arrythmias:2nd degree av block; Risk                 Factors:Hypertension, Dyslipidemia and Diabetes.  Sonographer:    Johny Chess RDCS Referring Phys: [place] Comments: Suboptimal parasternal window. IMPRESSIONS  1. Left ventricular ejection fraction, by estimation, is 60 to 65%. The left ventricle has normal function. The left ventricle has no regional wall motion abnormalities. Left ventricular diastolic parameters are indeterminate.  2. Right ventricular systolic function is normal. The right ventricular size is normal.  3. The mitral valve is normal in structure. Mild mitral valve regurgitation. No evidence of mitral stenosis.  4. The aortic valve is normal in structure. Aortic valve regurgitation is not visualized. No aortic stenosis is present.  5. The inferior vena cava is normal in size with greater than 50% respiratory variability, suggesting right atrial pressure of 3 mmHg. FINDINGS  Left Ventricle: Left ventricular ejection fraction, by estimation, is 60 to 65%. The left ventricle has normal function. The left ventricle has no regional wall motion abnormalities. The left ventricular internal cavity size  was normal in size. There is  no left ventricular hypertrophy. Left ventricular  diastolic parameters are indeterminate. Right Ventricle: The right ventricular size is normal. No increase in right ventricular wall thickness. Right ventricular systolic function is normal. Left Atrium: Left atrial size was normal in size. Right Atrium: Right atrial size was normal in size. Pericardium: There is no evidence of pericardial effusion. Presence of pericardial fat pad. Mitral Valve: The mitral valve is normal in structure. Mild mitral valve regurgitation. No evidence of mitral valve stenosis. Tricuspid Valve: The tricuspid valve is normal in structure. Tricuspid valve regurgitation is not demonstrated. No evidence of tricuspid stenosis. Aortic Valve: The aortic valve is normal in structure. Aortic valve regurgitation is not visualized. No aortic stenosis is present. Aortic valve mean gradient measures 7.0 mmHg. Aortic valve peak gradient measures 14.0 mmHg. Pulmonic Valve: The pulmonic valve was normal in structure. Pulmonic valve regurgitation is not visualized. No evidence of pulmonic stenosis. Aorta: The aortic root is normal in size and structure. Venous: The inferior vena cava is normal in size with greater than 50% respiratory variability, suggesting right atrial pressure of 3 mmHg. IAS/Shunts: No atrial level shunt detected by color flow Doppler.  LEFT VENTRICLE PLAX 2D LVIDd:         3.50 cm Diastology LVIDs:         2.20 cm LV e' medial:    13.70 cm/s                        LV E/e' medial:  10.9                        LV e' lateral:   11.70 cm/s                        LV E/e' lateral: 12.8  RIGHT VENTRICLE             IVC RV S prime:     12.60 cm/s  IVC diam: 1.90 cm TAPSE (M-mode): 2.0 cm LEFT ATRIUM             Index        RIGHT ATRIUM           Index LA Vol (A2C):   30.1 ml 16.16 ml/m  RA Area:     11.60 cm LA Vol (A4C):   51.8 ml 27.82 ml/m  RA Volume:   25.80 ml  13.85 ml/m LA Biplane Vol: 41.6 ml 22.34 ml/m  AORTIC VALVE AV Vmax:           187.00 cm/s AV Vmean:          118.000 cm/s  AV VTI:            0.438 m AV Peak Grad:      14.0 mmHg AV Mean Grad:      7.0 mmHg LVOT Vmax:         145.00 cm/s LVOT Vmean:        95.400 cm/s LVOT VTI:          0.375 m LVOT/AV VTI ratio: 0.86  AORTA Ao Asc diam: 3.10 cm MITRAL VALVE MV Area (PHT): 4.63 cm     SHUNTS MV Decel Time: 164 msec     Systemic VTI: 0.38 m MV E velocity: 150.00 cm/s MV A velocity: 139.00 cm/s MV E/A ratio:  1.08 Kardie Tobb DO Electronically signed by Berniece Salines  DO Signature Date/Time: 04/30/2021/6:06:49 PM    Final     Cardiac Studies   2D echocardiogram 04/30/2021:  1. Left ventricular ejection fraction, by estimation, is 60 to 65%. The  left ventricle has normal function. The left ventricle has no regional  wall motion abnormalities. Left ventricular diastolic parameters are  indeterminate.   2. Right ventricular systolic function is normal. The right ventricular  size is normal.   3. The mitral valve is normal in structure. Mild mitral valve  regurgitation. No evidence of mitral stenosis.   4. The aortic valve is normal in structure. Aortic valve regurgitation is  not visualized. No aortic stenosis is present.   5. The inferior vena cava is normal in size with greater than 50%  respiratory variability, suggesting right atrial pressure of 3 mmHg.   Patient Profile     80 y.o. female presenting with post-COVID malaise and failure to thrive.  She was found to have 2-1 AV block in the emergency room.  Assessment & Plan    1.  2-1 AV block: Resolved with discontinuation of atenolol.  Patient now conducting one-to-one and normal sinus rhythm with occasional nonconducted P waves.  No ischemic symptoms.  Patient appears to be completely asymptomatic with respect to her heart rhythm at present.  I would not recommend pursuing a pacemaker.  She needs to avoid AV nodal blocking agents in the future and should not be treated with digoxin, beta-blockers, or calcium channel blockers.  Will arrange outpatient cardiology  follow-up.  2D echocardiogram reviewed and is essentially normal.  Other problems per hospitalist team.  [facility] will sign off.   Medication Recommendations: As above Other recommendations (labs, testing, etc): None Follow up as an outpatient: We will arrange  For questions or updates, please contact [place] Please consult [email or web address] for contact info under        Signed, Sherren Mocha, MD  05/01/2021, 8:56 AM

## 2021-05-01 NOTE — Discharge Summary (Signed)
Physician Discharge Summary  Kayla [name] TXM:[id number] DOB: January 11, 1941 DOA: 04/29/2021  PCP: Cipriano Mile, NP  Admit date: 04/29/2021 Discharge date: 05/01/2021  Admitted From: Home Disposition: Home   Recommendations for Outpatient Follow-up:  Follow up with PCP for diabetes management Follow up with cardiology in next 1-2 weeks.  Stopped atenolol due to 2nd degree AV block that has improved. Recommend avoidance of AV nodal blocking agents in the future inclusive of CCBs, BBs, digoxin.   Home Health: PT, OT, aide Equipment/Devices: None new Discharge Condition: Stable CODE STATUS: Full Diet recommendation: Carb-modified  Brief/Interim Summary: Kayla [name] is an 80 y.o. female with a history of CAD s/p stenting D1, and LAD July 2016, T2DM (HbA1c 8.7%), HTN, HLD, CVA complicated by seizure disorder, breast CA, sickle cell trait, and covid-19 infection diagnosed 04/19/2021 who presented to the ED 11/9 with fatigue and weakness, loss of appetite worsening for a few days found to be in 2:1 2nd degree AVB with rate in 30's, LBBB, and preserved BP. Troponin negative x2. SCr 2.1 from possible baseline of 1.4. SARS-CoV-2 PCR was performed and weakly positive (CT 31) though she has no dyspnea or infiltrate on CXR. TFTs normal. Atenolol was held, cardiology recommended admission for monitoring while letting atenolol washout. Subsequently, cardiac rhythm normalized and the patient was cleared for discharge by medical and cardiology teams with plans to discontinue atenolol and any other AV nodal blocking agents indefinitely in lieu of pursuing pacemaker.   Discharge Diagnoses:  Active Problems:   Hyperlipidemia associated with type 2 diabetes mellitus ([place])   Essential hypertension   Type 2 diabetes mellitus with diabetic neuropathy (HCC)   Seizure as late effect of cerebrovascular accident (CVA) ([place])   CAD (coronary artery disease)   2nd degree AV block   CKD (chronic kidney disease) stage  3, GFR 30-59 ml/min (HCC)   COVID-19 virus infection   AV block, 2nd degree   Malnutrition of moderate degree  2:1 2nd degree AV block: Improving conduction block only rarely, now sinus bradycardia. TFTs wnl.  - Cardiology recommends outpatient monitoring in lieu of pacemaker given improvement while holding beta blocker.    Covid-19 infection: Diagnosed 04/19/2021 without evidence of pneumonia. Pt is afebrile, and past 10 days isolation period. No further treatment is needed, presenting outside window of benefit with low cycle threshold. Remdesivir may worsen bradycardia as well. Note normal LFTs, platelets, and lymphocyte counts.    CAD s/p stents 2016 without angina: LBBB on ECG, troponins negative, no angina, no wall motion abnormalities on echocardiogram, preserved LVEF.  - Continue plavix, statin, hold BB as above   T2DM: Poorly controlled with HbA1c 8.7%. Glucosuria and ketonuria noted though no elevation in anion gap and glucose not elevated terribly.  - Follow up with PCP for further management.   AKI on stage IIIb CKD: Improving, though baseline unclear, best guess is CrCl 30-44m/min.  - Monitor metabolic panel at follow up.   HTN:  - Continue home medications   HLD:  - Continue statin   History of CVA, ensuing seizure disorder:  - Continue antiplatelet, statin, RF modification as above, and AED.    Moderate protein-calorie malnutrition:  - Supplement protein as able  Discharge Instructions Discharge Instructions     Diet - low sodium heart healthy   Complete by: As directed    Discharge instructions   Complete by: As directed    You were admitted for an abnormal heart rhythm called 2nd degree AV block, which has resolved  since we stopped atenolol. You should not take this or any other medications that will slow your heart rate. Cardiology recommends discharge today and will arrange follow up in their office for continued management.  - STOP atenolol - You can  continue other medications for your heart.  - CONTINUE taking medications for diabetes. Your average blood sugar is too high, approximately 269m/dl, so you will need to follow up with your primary doctor as soon as possible to discuss changes in medications. - You have completed the period of isolation for covid-19 infection, don't need any repeat testing, but should continue regular precautions. You are encouraged to get vaccinated in about 90 days. - If your symptoms return/worsen, seek medical attention right away.   Increase activity slowly   Complete by: As directed       Allergies as of 05/01/2021       Reactions   Amlodipine Shortness Of Breath, Rash   Fish Allergy Hives   Penicillins Other (See Comments)   REACTION: red rash   Shellfish Allergy Hives   Peanuts [peanut Oil] Rash   Tomato Rash        Medication List     STOP taking these medications    atenolol 25 MG tablet Commonly known as: TENORMIN   atropine 1 % ophthalmic solution       TAKE these medications    acetaminophen 500 MG tablet Commonly known as: TYLENOL Take 1,000 mg by mouth every 6 (six) hours as needed for moderate pain or headache.   atorvastatin 20 MG tablet Commonly known as: LIPITOR Take 1 tablet (20 mg total) by mouth daily at 6 PM. What changed: when to take this   blood glucose meter kit and supplies Kit Dispense what is covered by patients insurance with strips and lancets. Use twice a day (before breakfast and at bedtime). E11.40   cetirizine 10 MG tablet Commonly known as: ZYRTEC Take 10 mg by mouth daily.   clopidogrel 75 MG tablet Commonly known as: PLAVIX Take 1 tablet (75 mg total) by mouth daily. OFFICE VISIT IS DUE   dextromethorphan-guaiFENesin 30-600 MG 12hr tablet Commonly known as: MUCINEX DM Take 1 tablet by mouth 2 (two) times daily as needed for cough.   glipiZIDE 2.5 MG 24 hr tablet Commonly known as: GLUCOTROL XL Take 1 tablet (2.5 mg total) by  mouth 2 (two) times daily. VISIT IS OVERDUE!!!!!   glycopyrrolate 1 MG tablet Commonly known as: ROBINUL Take 1 mg by mouth daily.   hydrALAZINE 50 MG tablet Commonly known as: APRESOLINE Take 1 tablet (50 mg total) by mouth 2 (two) times daily. Annual appt due in July must see provider for future refills   hydrochlorothiazide 12.5 MG tablet Commonly known as: HYDRODIURIL Take 12.5 mg by mouth daily.   isosorbide mononitrate 30 MG 24 hr tablet Commonly known as: IMDUR Take 30 mg by mouth daily. What changed: Another medication with the same name was removed. Continue taking this medication, and follow the directions you see here.   Jardiance 10 MG Tabs tablet Generic drug: empagliflozin TAKE 1 TABLET BY MOUTH EVERY DAY What changed: how much to take   levETIRAcetam 500 MG tablet Commonly known as: KEPPRA Take 500 mg by mouth at bedtime. What changed: Another medication with the same name was removed. Continue taking this medication, and follow the directions you see here.   lisinopril 20 MG tablet Commonly known as: ZESTRIL Take 1 tablet (20 mg total) by mouth daily. Needs office  visit   Metamucil Fiber Chew Chew 1 tablet by mouth daily.   nitroGLYCERIN 0.4 MG SL tablet Commonly known as: NITROSTAT Place 0.4 mg under the tongue every 5 (five) minutes as needed for chest pain.   ONE TOUCH ULTRA TEST test strip Generic drug: glucose blood USE TO TEST TWICE DAILY (BEFORE BREAKFAST, BEDTIME) E11.4 What changed: See the new instructions.   glucose blood test strip Use to check blood sugars twice daily What changed: additional instructions   OneTouch Ultra test strip Generic drug: glucose blood USE TO CHECK BLOOD SUGAR TWICE DAILY What changed: Another medication with the same name was changed. Make sure you understand how and when to take each.   OneTouch Delica Lancets 76H Misc USE TO TEST BLOOD SUGAR TWICE DAILY What changed: See the new instructions.    polyethylene glycol 17 g packet Commonly known as: MIRALAX / GLYCOLAX Take 17 g by mouth daily as needed for moderate constipation.   Vitamin B-12 2500 MCG Subl Place 2,500 mcg under the tongue daily.   vitamin E 180 MG (400 UNITS) capsule Generic drug: vitamin E Take 400 Units by mouth daily.        Follow-up Information     Cipriano Mile, NP Follow up.   Contact information: [place] [address] [phone]         Sueanne Margarita, MD .   Specialty: Cardiology Contact information: [phone] [place] [address] [phone]                Allergies  Allergen Reactions   Amlodipine Shortness Of Breath and Rash   Fish Allergy Hives   Penicillins Other (See Comments)    REACTION: red rash   Shellfish Allergy Hives   Peanuts [Peanut Oil] Rash   Tomato Rash    Consultations: Cardiology  Procedures/Studies: DG Chest Portable 1 View  Result Date: 04/29/2021 CLINICAL DATA:  80 year old female with history of weakness. EXAM: PORTABLE CHEST 1 VIEW COMPARISON:  Chest x-ray 06/19/2016. FINDINGS: Transcutaneous defibrillator pad projecting over the left hemithorax. Lung volumes are low. No consolidative airspace disease. No pleural effusions. No pneumothorax. No evidence of pulmonary edema. Heart size is normal. Mediastinal contours are distorted by patient positioning. Atherosclerotic calcifications in the thoracic aorta. IMPRESSION: 1. Low lung volumes without radiographic evidence of acute cardiopulmonary disease. 2. Aortic atherosclerosis. Electronically Signed   By: Vinnie Langton M.D.   On: 04/29/2021 15:23   ECHOCARDIOGRAM COMPLETE  Result Date: 04/30/2021    ECHOCARDIOGRAM REPORT   Patient Name:   Kayla [name] Date of Exam: 04/30/2021 Medical Rec #:  [id number]      Height:       66.5 in Accession #:    [id number]     Weight:       166.9 lb Date of Birth:  February 10, 1941      BSA:          1.862 m Patient Age:    50  years       BP:           148/57 mmHg Patient Gender: F              HR:           47 bpm. Exam Location:  Inpatient Procedure: 2D Echo Indications:    second degree heart block  History:        Patient has prior history of Echocardiogram examinations, most  recent 09/02/2014. CAD, Arrythmias:2nd degree av block; Risk                 Factors:Hypertension, Dyslipidemia and Diabetes.  Sonographer:    Johny Chess RDCS Referring Phys: [place] Comments: Suboptimal parasternal window. IMPRESSIONS  1. Left ventricular ejection fraction, by estimation, is 60 to 65%. The left ventricle has normal function. The left ventricle has no regional wall motion abnormalities. Left ventricular diastolic parameters are indeterminate.  2. Right ventricular systolic function is normal. The right ventricular size is normal.  3. The mitral valve is normal in structure. Mild mitral valve regurgitation. No evidence of mitral stenosis.  4. The aortic valve is normal in structure. Aortic valve regurgitation is not visualized. No aortic stenosis is present.  5. The inferior vena cava is normal in size with greater than 50% respiratory variability, suggesting right atrial pressure of 3 mmHg. FINDINGS  Left Ventricle: Left ventricular ejection fraction, by estimation, is 60 to 65%. The left ventricle has normal function. The left ventricle has no regional wall motion abnormalities. The left ventricular internal cavity size was normal in size. There is  no left ventricular hypertrophy. Left ventricular diastolic parameters are indeterminate. Right Ventricle: The right ventricular size is normal. No increase in right ventricular wall thickness. Right ventricular systolic function is normal. Left Atrium: Left atrial size was normal in size. Right Atrium: Right atrial size was normal in size. Pericardium: There is no evidence of pericardial effusion. Presence of pericardial fat pad. Mitral Valve: The mitral  valve is normal in structure. Mild mitral valve regurgitation. No evidence of mitral valve stenosis. Tricuspid Valve: The tricuspid valve is normal in structure. Tricuspid valve regurgitation is not demonstrated. No evidence of tricuspid stenosis. Aortic Valve: The aortic valve is normal in structure. Aortic valve regurgitation is not visualized. No aortic stenosis is present. Aortic valve mean gradient measures 7.0 mmHg. Aortic valve peak gradient measures 14.0 mmHg. Pulmonic Valve: The pulmonic valve was normal in structure. Pulmonic valve regurgitation is not visualized. No evidence of pulmonic stenosis. Aorta: The aortic root is normal in size and structure. Venous: The inferior vena cava is normal in size with greater than 50% respiratory variability, suggesting right atrial pressure of 3 mmHg. IAS/Shunts: No atrial level shunt detected by color flow Doppler.  LEFT VENTRICLE PLAX 2D LVIDd:         3.50 cm Diastology LVIDs:         2.20 cm LV e' medial:    13.70 cm/s                        LV E/e' medial:  10.9                        LV e' lateral:   11.70 cm/s                        LV E/e' lateral: 12.8  RIGHT VENTRICLE             IVC RV S prime:     12.60 cm/s  IVC diam: 1.90 cm TAPSE (M-mode): 2.0 cm LEFT ATRIUM             Index        RIGHT ATRIUM           Index LA Vol (A2C):   30.1 ml 16.16 ml/m  RA Area:  11.60 cm LA Vol (A4C):   51.8 ml 27.82 ml/m  RA Volume:   25.80 ml  13.85 ml/m LA Biplane Vol: 41.6 ml 22.34 ml/m  AORTIC VALVE AV Vmax:           187.00 cm/s AV Vmean:          118.000 cm/s AV VTI:            0.438 m AV Peak Grad:      14.0 mmHg AV Mean Grad:      7.0 mmHg LVOT Vmax:         145.00 cm/s LVOT Vmean:        95.400 cm/s LVOT VTI:          0.375 m LVOT/AV VTI ratio: 0.86  AORTA Ao Asc diam: 3.10 cm MITRAL VALVE MV Area (PHT): 4.63 cm     SHUNTS MV Decel Time: 164 msec     Systemic VTI: 0.38 m MV E velocity: 150.00 cm/s MV A velocity: 139.00 cm/s MV E/A ratio:  1.08 Kardie Tobb  DO Electronically signed by Berniece Salines DO Signature Date/Time: 04/30/2021/6:06:49 PM    Final      Subjective: Feels well, wants to go home. No dyspnea or chest pain or palpitations. NSR this AM with rare nonconducted p waves, normotensive.  Discharge Exam: Vitals:   05/01/21 0330 05/01/21 0717  BP: (!) 151/98 128/76  Pulse: (!) 43 96  Resp: 19 20  Temp: 97.6 F (36.4 C) 97.8 F (36.6 C)  SpO2: 97% 91%   General: Pt is alert, awake, not in acute distress Cardiovascular: RRR, S1/S2 +, no rubs, no gallops Respiratory: CTA bilaterally, no wheezing, no rhonchi Abdominal: Soft, NT, ND, bowel sounds + Extremities: No edema, no cyanosis  Labs: BNP (last 3 results) No results for input(s): BNP in the last 8760 hours. Basic Metabolic Panel: Recent Labs  Lab 04/29/21 1437 04/29/21 1542 04/30/21 0149 05/01/21 0159  NA 139 144 140 135  K 4.6 4.4 4.4 4.0  CL 108 113* 111 105  CO2 20*  --  19* 20*  GLUCOSE 142* 138* 101* 155*  BUN 49* 48* 42* 37*  CREATININE 1.98* 2.10* 1.64* 1.56*  CALCIUM 8.7*  --  8.7* 8.5*  MG 2.6*  --   --   --    Liver Function Tests: Recent Labs  Lab 04/29/21 1437  AST 14*  ALT 11  ALKPHOS 86  BILITOT 0.9  PROT 7.2  ALBUMIN 3.2*   No results for input(s): LIPASE, AMYLASE in the last 168 hours. No results for input(s): AMMONIA in the last 168 hours. CBC: Recent Labs  Lab 04/29/21 1437 04/29/21 1542  WBC 5.0  --   NEUTROABS 3.3  --   HGB 15.0 16.0*  HCT 47.2* 47.0*  MCV 91.5  --   PLT 226  --    Cardiac Enzymes: No results for input(s): CKTOTAL, CKMB, CKMBINDEX, TROPONINI in the last 168 hours. BNP: Invalid input(s): POCBNP CBG: Recent Labs  Lab 04/30/21 1112 04/30/21 1819 04/30/21 2127 05/01/21 0617  GLUCAP 199* 163* 222* 146*   D-Dimer No results for input(s): DDIMER in the last 72 hours. Hgb A1c Recent Labs    04/30/21 0149  HGBA1C 8.7*   Lipid Profile No results for input(s): CHOL, HDL, LDLCALC, TRIG, CHOLHDL,  LDLDIRECT in the last 72 hours. Thyroid function studies Recent Labs    04/29/21 1437  TSH 1.546   Anemia work up No results for input(s): VITAMINB12, FOLATE, FERRITIN,  TIBC, IRON, RETICCTPCT in the last 72 hours. Urinalysis    Component Value Date/Time   COLORURINE YELLOW 04/29/2021 1946   APPEARANCEUR HAZY (A) 04/29/2021 1946   LABSPEC 1.013 04/29/2021 1946   PHURINE 5.0 04/29/2021 1946   GLUCOSEU >=500 (A) 04/29/2021 1946   GLUCOSEU NEGATIVE 02/25/2016 0949   HGBUR NEGATIVE 04/29/2021 1946   BILIRUBINUR NEGATIVE 04/29/2021 1946   BILIRUBINUR neg 02/03/2017 1132   KETONESUR 5 (A) 04/29/2021 1946   PROTEINUR NEGATIVE 04/29/2021 1946   UROBILINOGEN 0.2 02/03/2017 1132   UROBILINOGEN 0.2 02/25/2016 0949   NITRITE NEGATIVE 04/29/2021 1946   LEUKOCYTESUR SMALL (A) 04/29/2021 1946    Microbiology Recent Results (from the past 240 hour(s))  Resp Panel by RT-PCR (Flu A&B, Covid) Nasopharyngeal Swab     Status: Abnormal   Collection Time: 04/29/21  3:16 PM   Specimen: Nasopharyngeal Swab; Nasopharyngeal(NP) swabs in vial transport medium  Result Value Ref Range Status   SARS Coronavirus 2 by RT PCR POSITIVE (A) NEGATIVE Final    Comment: RESULT CALLED TO, READ BACK BY AND VERIFIED WITH: CHRIS CRISCO RN 04/29/2021 _0  BY JW (NOTE) SARS-CoV-2 target nucleic acids are DETECTED.  The SARS-CoV-2 RNA is generally detectable in upper respiratory specimens during the acute phase of infection. Positive results are indicative of the presence of the identified virus, but do not rule out bacterial infection or co-infection with other pathogens not detected by the test. Clinical correlation with patient history and other diagnostic information is necessary to determine patient infection status. The expected result is Negative.  Fact Sheet for Patients: EntrepreneurPulse.com.au  Fact Sheet for Healthcare Providers: IncredibleEmployment.be  This  test is not yet approved or cleared by the [place] FDA and  has been authorized for detection and/or diagnosis of SARS-CoV-2 by FDA under an Emergency Use Authorization (EUA).  This EUA will remain in effect (meaning this test ca n be used) for the duration of  the COVID-19 declaration under Section 564(b)(1) of the Act, 21 U.S.C. section 360bbb-3(b)(1), unless the authorization is terminated or revoked sooner.     Influenza A by PCR NEGATIVE NEGATIVE Final   Influenza B by PCR NEGATIVE NEGATIVE Final    Comment: (NOTE) The Xpert Xpress SARS-CoV-2/FLU/RSV plus assay is intended as an aid in the diagnosis of influenza from Nasopharyngeal swab specimens and should not be used as a sole basis for treatment. Nasal washings and aspirates are unacceptable for Xpert Xpress SARS-CoV-2/FLU/RSV testing.  Fact Sheet for Patients: EntrepreneurPulse.com.au  Fact Sheet for Healthcare Providers: IncredibleEmployment.be  This test is not yet approved or cleared by the [place] FDA and has been authorized for detection and/or diagnosis of SARS-CoV-2 by FDA under an Emergency Use Authorization (EUA). This EUA will remain in effect (meaning this test can be used) for the duration of the COVID-19 declaration under Section 564(b)(1) of the Act, 21 U.S.C. section 360bbb-3(b)(1), unless the authorization is terminated or revoked.  Performed at [facility], [place] [address], [place], [place] [address]     Time coordinating discharge: Approximately 40 minutes  Patrecia Pour, MD  [facility] 05/01/2021, 9:11 AM

## 2021-05-01 NOTE — Plan of Care (Signed)
  Problem: Education: Goal: Knowledge of General Education information will improve Description: Including pain rating scale, medication(s)/side effects and non-pharmacologic comfort measures Outcome: Adequate for Discharge   

## 2021-05-01 NOTE — TOC Transition Note (Signed)
Transition of Care (TOC) - CM/SW Discharge Note Marvetta Gibbons RN, BSN Transitions of Care Unit 4E- RN Case Manager See Treatment Team for direct phone #    Patient Details  Name: Kayla [name] MRN: [id number] Date of Birth: 02-Dec-1940  [facility] CM/SW Contact:  Dawayne Patricia, RN Phone Number: 05/01/2021, 10:20 AM   Clinical Narrative:    Pt stable for transition home today, notified by [facility] that pt was active with them for PT/OT/aide- orders have been placed for resumption of [place] services as previously ordered.  Patient to return home w/ HH. No further TOC needs noted.    Final next level of care: Home w Home Health Services Barriers to Discharge: No Barriers Identified   Patient Goals and CMS Choice   CMS Medicare.gov Compare Post Acute Care list provided to:: Patient Choice offered to / list presented to : Patient  Discharge Placement               Home w/ [facility]        Discharge Plan and Services   Discharge Planning Services: CM Consult Post Acute Care Choice: Resumption of Svcs/PTA Provider, Home Health          DME Arranged: N/A DME Agency: NA       HH Arranged: PT, OT, Nurse's Aide [place] Agency: [place] ([place]) Date HH Agency Contacted: 05/01/21 Time [place]: 45 Representative spoke with at [place]: [place] ([place]) Interventions     Readmission Risk Interventions Readmission Risk Prevention Plan 05/01/2021  Transportation Screening Complete  PCP or Specialist Appt within 5-7 Days Complete  Home Care Screening Complete  Medication Review (RN CM) Complete  Some recent data might be hidden

## 2021-05-03 ENCOUNTER — Emergency Department (HOSPITAL_COMMUNITY): Payer: Medicare (Managed Care)

## 2021-05-03 ENCOUNTER — Other Ambulatory Visit: Payer: Self-pay

## 2021-05-03 ENCOUNTER — Inpatient Hospital Stay (HOSPITAL_COMMUNITY)
Admission: EM | Admit: 2021-05-03 | Discharge: 2021-05-11 | DRG: 242 | Disposition: A | Discharge status: Skilled Nursing Facility | Payer: Medicare (Managed Care) | Attending: Family Medicine | Admitting: Family Medicine

## 2021-05-03 DIAGNOSIS — E875 Hyperkalemia: Secondary | ICD-10-CM | POA: Diagnosis present

## 2021-05-03 DIAGNOSIS — Z955 Presence of coronary angioplasty implant and graft: Secondary | ICD-10-CM

## 2021-05-03 DIAGNOSIS — Z91013 Allergy to seafood: Secondary | ICD-10-CM

## 2021-05-03 DIAGNOSIS — I129 Hypertensive chronic kidney disease with stage 1 through stage 4 chronic kidney disease, or unspecified chronic kidney disease: Secondary | ICD-10-CM | POA: Diagnosis present

## 2021-05-03 DIAGNOSIS — M109 Gout, unspecified: Secondary | ICD-10-CM | POA: Diagnosis present

## 2021-05-03 DIAGNOSIS — Z950 Presence of cardiac pacemaker: Secondary | ICD-10-CM

## 2021-05-03 DIAGNOSIS — Z452 Encounter for adjustment and management of vascular access device: Secondary | ICD-10-CM

## 2021-05-03 DIAGNOSIS — M797 Fibromyalgia: Secondary | ICD-10-CM | POA: Diagnosis present

## 2021-05-03 DIAGNOSIS — E872 Acidosis, unspecified: Secondary | ICD-10-CM | POA: Diagnosis present

## 2021-05-03 DIAGNOSIS — I69398 Other sequelae of cerebral infarction: Secondary | ICD-10-CM

## 2021-05-03 DIAGNOSIS — K5900 Constipation, unspecified: Secondary | ICD-10-CM | POA: Diagnosis present

## 2021-05-03 DIAGNOSIS — E1140 Type 2 diabetes mellitus with diabetic neuropathy, unspecified: Secondary | ICD-10-CM | POA: Diagnosis present

## 2021-05-03 DIAGNOSIS — Z8249 Family history of ischemic heart disease and other diseases of the circulatory system: Secondary | ICD-10-CM

## 2021-05-03 DIAGNOSIS — R001 Bradycardia, unspecified: Secondary | ICD-10-CM

## 2021-05-03 DIAGNOSIS — U071 COVID-19: Secondary | ICD-10-CM

## 2021-05-03 DIAGNOSIS — E860 Dehydration: Secondary | ICD-10-CM | POA: Diagnosis present

## 2021-05-03 DIAGNOSIS — M79675 Pain in left toe(s): Secondary | ICD-10-CM | POA: Diagnosis present

## 2021-05-03 DIAGNOSIS — I442 Atrioventricular block, complete: Principal | ICD-10-CM | POA: Diagnosis present

## 2021-05-03 DIAGNOSIS — G9341 Metabolic encephalopathy: Secondary | ICD-10-CM | POA: Diagnosis present

## 2021-05-03 DIAGNOSIS — Z7902 Long term (current) use of antithrombotics/antiplatelets: Secondary | ICD-10-CM

## 2021-05-03 DIAGNOSIS — E7800 Pure hypercholesterolemia, unspecified: Secondary | ICD-10-CM | POA: Diagnosis present

## 2021-05-03 DIAGNOSIS — E1165 Type 2 diabetes mellitus with hyperglycemia: Secondary | ICD-10-CM | POA: Diagnosis present

## 2021-05-03 DIAGNOSIS — N179 Acute kidney failure, unspecified: Secondary | ICD-10-CM | POA: Diagnosis not present

## 2021-05-03 DIAGNOSIS — I2510 Atherosclerotic heart disease of native coronary artery without angina pectoris: Secondary | ICD-10-CM | POA: Diagnosis present

## 2021-05-03 DIAGNOSIS — I10 Essential (primary) hypertension: Secondary | ICD-10-CM

## 2021-05-03 DIAGNOSIS — E876 Hypokalemia: Secondary | ICD-10-CM | POA: Diagnosis not present

## 2021-05-03 DIAGNOSIS — N189 Chronic kidney disease, unspecified: Secondary | ICD-10-CM | POA: Diagnosis not present

## 2021-05-03 DIAGNOSIS — F0150 Vascular dementia without behavioral disturbance: Secondary | ICD-10-CM | POA: Diagnosis present

## 2021-05-03 DIAGNOSIS — E1122 Type 2 diabetes mellitus with diabetic chronic kidney disease: Secondary | ICD-10-CM | POA: Diagnosis present

## 2021-05-03 DIAGNOSIS — Z91010 Allergy to peanuts: Secondary | ICD-10-CM

## 2021-05-03 DIAGNOSIS — G40909 Epilepsy, unspecified, not intractable, without status epilepticus: Secondary | ICD-10-CM | POA: Diagnosis present

## 2021-05-03 DIAGNOSIS — R570 Cardiogenic shock: Secondary | ICD-10-CM | POA: Diagnosis present

## 2021-05-03 DIAGNOSIS — Z90710 Acquired absence of both cervix and uterus: Secondary | ICD-10-CM

## 2021-05-03 DIAGNOSIS — Z880 Allergy status to penicillin: Secondary | ICD-10-CM

## 2021-05-03 DIAGNOSIS — Z8616 Personal history of COVID-19: Secondary | ICD-10-CM

## 2021-05-03 DIAGNOSIS — L8990 Pressure ulcer of unspecified site, unspecified stage: Secondary | ICD-10-CM | POA: Insufficient documentation

## 2021-05-03 DIAGNOSIS — N1832 Chronic kidney disease, stage 3b: Secondary | ICD-10-CM | POA: Diagnosis present

## 2021-05-03 DIAGNOSIS — E440 Moderate protein-calorie malnutrition: Secondary | ICD-10-CM | POA: Diagnosis present

## 2021-05-03 DIAGNOSIS — D573 Sickle-cell trait: Secondary | ICD-10-CM | POA: Diagnosis present

## 2021-05-03 DIAGNOSIS — Z79899 Other long term (current) drug therapy: Secondary | ICD-10-CM

## 2021-05-03 DIAGNOSIS — K219 Gastro-esophageal reflux disease without esophagitis: Secondary | ICD-10-CM | POA: Diagnosis present

## 2021-05-03 DIAGNOSIS — Z7984 Long term (current) use of oral hypoglycemic drugs: Secondary | ICD-10-CM

## 2021-05-03 DIAGNOSIS — Z853 Personal history of malignant neoplasm of breast: Secondary | ICD-10-CM

## 2021-05-03 LAB — BASIC METABOLIC PANEL
Anion gap: 13 (ref 5–15)
BUN: 55 mg/dL — ABNORMAL HIGH (ref 8–23)
CO2: 17 mmol/L — ABNORMAL LOW (ref 22–32)
Calcium: 9.1 mg/dL (ref 8.9–10.3)
Chloride: 109 mmol/L (ref 98–111)
Creatinine, Ser: 3.72 mg/dL — ABNORMAL HIGH (ref 0.44–1.00)
GFR, Estimated: 12 mL/min — ABNORMAL LOW (ref >60)
Glucose, Bld: 152 mg/dL — ABNORMAL HIGH (ref 70–99)
Potassium: 5.5 mmol/L — ABNORMAL HIGH (ref 3.5–5.1)
Sodium: 139 mmol/L (ref 135–145)

## 2021-05-03 LAB — MAGNESIUM: Magnesium: 2.4 mg/dL (ref 1.7–2.4)

## 2021-05-03 LAB — CBC
HCT: 50.5 % — ABNORMAL HIGH (ref 36.0–46.0)
Hemoglobin: 15.9 g/dL — ABNORMAL HIGH (ref 12.0–15.0)
MCH: 29.3 pg (ref 26.0–34.0)
MCHC: 31.5 g/dL (ref 30.0–36.0)
MCV: 93 fL (ref 80.0–100.0)
Platelets: 253 10*3/uL (ref 150–400)
RBC: 5.43 MIL/uL — ABNORMAL HIGH (ref 3.87–5.11)
RDW: 18 % — ABNORMAL HIGH (ref 11.5–15.5)
WBC: 6.8 10*3/uL (ref 4.0–10.5)
nRBC: 0 % (ref 0.0–0.2)

## 2021-05-03 LAB — CBG MONITORING, ED: Glucose-Capillary: 142 mg/dL — ABNORMAL HIGH (ref 70–99)

## 2021-05-03 LAB — TROPONIN I (HIGH SENSITIVITY): Troponin I (High Sensitivity): 553 ng/L (ref <18)

## 2021-05-03 MED ORDER — INSULIN ASPART 100 UNIT/ML IV SOLN
5.0000 [IU] | Freq: Once | INTRAVENOUS | Status: AC
  Administered 2021-05-03: 5 [IU] via INTRAVENOUS

## 2021-05-03 MED ORDER — SODIUM ZIRCONIUM CYCLOSILICATE 10 G PO PACK
10.0000 g | PACK | Freq: Once | ORAL | Status: AC
  Administered 2021-05-03: 10 g via ORAL
  Filled 2021-05-03: qty 1

## 2021-05-03 MED ORDER — ASPIRIN 81 MG PO CHEW
324.0000 mg | CHEWABLE_TABLET | Freq: Once | ORAL | Status: AC
  Administered 2021-05-03: 324 mg via ORAL
  Filled 2021-05-03: qty 4

## 2021-05-03 MED ORDER — CALCIUM GLUCONATE 10 % IV SOLN
1.0000 g | Freq: Once | INTRAVENOUS | Status: AC
  Administered 2021-05-03: 1 g via INTRAVENOUS
  Filled 2021-05-03: qty 10

## 2021-05-03 MED ORDER — SODIUM CHLORIDE 0.9 % IV BOLUS
500.0000 mL | Freq: Once | INTRAVENOUS | Status: AC
  Administered 2021-05-03: 500 mL via INTRAVENOUS

## 2021-05-03 MED ORDER — DEXTROSE 50 % IV SOLN
1.0000 | Freq: Once | INTRAVENOUS | Status: AC
  Administered 2021-05-03: 50 mL via INTRAVENOUS
  Filled 2021-05-03: qty 50

## 2021-05-03 NOTE — H&P (View-Only) (Signed)
Cardiology Consult    Patient ID: Kayla [name] MRN: [id number], DOB/AGE: 81-Aug-1942   Admit date: 05/03/2021 Date of Consult: 05/03/2021 Requesting Provider: Godfrey Pick, MD  PCP:  Cipriano Mile, NP   [facility] Providers Cardiologist:  Fransico Him, MD       Patient Profile    Kayla [name] is a 80 y.o. 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 She is being seen today (05/03/2021) for the evaluation of bradycardia.  History of Present Illness    Recently admitted from 11/9 to 11/11 for 2:1 heart block after presenting with loss of appetite and lethargy. ECG showed 2:1 AV block with new LBBB and PR of 200 ms. She also had an AKI with Cr of 2.1 from baseline of around 1.5, improving by discharge with IV fluids. TTE was unrevealing. Conduction improved with discontinuation of atenolol and she was discharged home with plan for observation.   Since discharge her appetite has continued to be very poor. Today family felt that she seemed more confused and not making sense. They checked her VS and found her HR to be in the 30s but normotensive. On arrival to the ED she was found to be in complete heart block with escape rhythm around 30 bpm, but normotensive and reported feeling well. No reported syncope. Labs were notable for significant AKI with Cr of 3.7 from 1.56 just 2 days ago and K of 5.5. Troponin around 500. She was treated for hyperkalemia and conduction improved to 1:1 around 11pm. Currently, the patient is unable to answer any of my questions, responding only with "what did you say", "whats going on".   Past Medical History   Past Medical History:  Diagnosis Date   Allergy    Shell fish   Anxiety    Arthritis    "knees, back, probably left hand" (01/14/2015)   Asthmatic bronchitis    Blind  left eye    CAD (coronary artery disease)    cath in 2016 showing 40% ostial RCA, 40% ostial D1, 90% proximal D1, 85% and 70% sequential distal LAD stenosis and underwent PCI with stents in the D1 and LAD.   Constipation    Depression    Diverticulosis of colon    DJD (degenerative joint disease)    Fibromyalgia    GERD (gastroesophageal reflux disease)    Heart murmur    History of gout    Hypercholesteremia    Hypertension    Personal history of noncompliance with medical treatment, presenting hazards to health    Prolapse of vaginal vault after hysterectomy    Proteinuria    Sickle-cell trait ([place])    Somatic dysfunction    Stroke ([place]) ~ 08/2014   "blind in left eye; weak on left side since" (01/14/2015)   Type II diabetes mellitus ([place])    Venous insufficiency     Past Surgical History:  Procedure Laterality Date   ABDOMINAL HYSTERECTOMY     CARDIAC CATHETERIZATION N/A 01/14/2015   Procedure: Left Heart Cath and Coronary Angiography;  Surgeon: Charolette Forward, MD;  Location: [facility];  Service: Cardiovascular;  Laterality: N/A;   CARDIAC CATHETERIZATION  ~ 2011   CATARACT EXTRACTION W/ INTRAOCULAR LENS  IMPLANT, BILATERAL Bilateral 2012   CORONARY ANGIOPLASTY     DILATION AND CURETTAGE OF UTERUS  "  probably"   LOOP RECORDER IMPLANT N/A 09/03/2014   Procedure: LOOP RECORDER IMPLANT;  Surgeon: Thompson Grayer, MD;  Location: [facility] CATH LAB;  Service: Cardiovascular;  Laterality: N/A;   ROBOTIC ASSISTED LAPAROSCOPIC SACROCOLPOPEXY  09/2009   Dr. Matilde Sprang   TEE WITHOUT CARDIOVERSION N/A 09/02/2014   Procedure: TRANSESOPHAGEAL ECHOCARDIOGRAM (TEE);  Surgeon: Lelon Perla, MD;  Location: [facility]OSCOPY;  Service: Cardiovascular;  Laterality: N/A;     Allergies  Allergen Reactions   Amlodipine Shortness Of Breath and Rash   Fish Allergy Hives   Penicillins Other (See Comments)    REACTION: red rash   Shellfish Allergy Hives   Peanuts [Peanut Oil] Rash   Tomato Rash    Inpatient Medications      Family History    Family History  Problem Relation Age of Onset   Prostate cancer Father    Hypertension Father    Seizures Sister    Hypertension Sister    Bell's palsy Son    Heart attack Neg Hx    Stroke Neg Hx    She indicated that her mother is deceased. She indicated that her father is deceased. She indicated that one of her two sisters is deceased. She indicated that her brother is deceased. She indicated that her son is alive. She indicated that the status of her neg hx is unknown.   Social History    Social History   Socioeconomic History   Marital status: Married    Spouse name: Not on file   Number of children: 4   Years of education: 13   Highest education level: Not on file  Occupational History   Occupation: [employer]    Comment: retired  Tobacco Use   Smoking status: Never   Smokeless tobacco: Never  Substance and Sexual Activity   Alcohol use: Yes    Alcohol/week: 0.0 standard drinks    Comment: 01/14/2015 "might have a glass of wine a few times/yr"   Drug use: No   Sexual activity: Never  Other Topics Concern   Not on file  Social History Narrative   Married   Right handed   Caffeine use - 1 cup coffee daily   Social Determinants of Health   Financial Resource Strain: Not on file  Food Insecurity: Not on file  Transportation Needs: Not on file  Physical Activity: Not on file  Stress: Not on file  Social Connections: Not on file  Intimate Partner Violence: Not on file     Review of Systems    Unable to obtain due to patient condition  Physical Exam    Blood pressure (!) 136/38, pulse (!) 40, resp. rate 19, SpO2 100 %.    No intake or output data in the 24 hours ending 05/03/21 2254 Wt Readings from Last 3 Encounters:  04/30/21 75.7 kg  05/23/19 81.2 kg  05/11/19 81.2 kg    CONSTITUTIONAL: drowsy, not responding appropriately to questions, in no acute distress HEENT: normal NECK: no JVD, no  masses CARDIAC: Regular rhythm, currently sinus rhythm with 1:1 conduction. Normal S1/S2, no S3/S4. No murmur. No friction rub.  VASCULAR: Radial pulses intact bilaterally. PULMONARY/CHEST WALL: no deformities, normal breath sounds bilaterally, normal work of breathing ABDOMINAL: soft, non-tender, non-distended EXTREMITIES: no edema, mild muscle atrophy, warm and well-perfused SKIN: Dry and intact without apparent rashes or wounds. No peripheral cyanosis. NEUROLOGIC: drowsy but attentive, does not respond appropriately to questions, mild asterixis present   Labs    Recent Labs  05/03/21 2035  TROPONINIHS 553*   Lab Results  Component Value Date   WBC 6.8 05/03/2021   HGB 15.9 (H) 05/03/2021   HCT 50.5 (H) 05/03/2021   MCV 93.0 05/03/2021   PLT 253 05/03/2021    Recent Labs  Lab 04/29/21 1437 04/29/21 1542 05/03/21 2035  NA 139   < > 139  K 4.6   < > 5.5*  CL 108   < > 109  CO2 20*   < > 17*  BUN 49*   < > 55*  CREATININE 1.98*   < > 3.72*  CALCIUM 8.7*   < > 9.1  PROT 7.2  --   --   BILITOT 0.9  --   --   ALKPHOS 86  --   --   ALT 11  --   --   AST 14*  --   --   GLUCOSE 142*   < > 152*   < > = values in this interval not displayed.   Lab Results  Component Value Date   CHOL 106 12/29/2018   HDL 35.10 (L) 12/29/2018   LDLCALC 44 12/29/2018   TRIG 136.0 12/29/2018      Radiology Studies    DG Chest Portable 1 View  Result Date: 05/03/2021 CLINICAL DATA:  Bradycardia EXAM: PORTABLE CHEST 1 VIEW COMPARISON:  04/29/2021 FINDINGS: Lungs are symmetrically expanded and are clear. No pneumothorax or pleural effusion. Cardiac size within normal limits. Pulmonary vascularity is normal. Osseous structures are age-appropriate. No acute bone abnormality. IMPRESSION: No active disease. Electronically Signed   By: Fidela Salisbury M.D.   On: 05/03/2021 20:30   DG Chest Portable 1 View  Result Date: 04/29/2021 CLINICAL DATA:  80 year old female with history of weakness.  EXAM: PORTABLE CHEST 1 VIEW COMPARISON:  Chest x-ray 06/19/2016. FINDINGS: Transcutaneous defibrillator pad projecting over the left hemithorax. Lung volumes are low. No consolidative airspace disease. No pleural effusions. No pneumothorax. No evidence of pulmonary edema. Heart size is normal. Mediastinal contours are distorted by patient positioning. Atherosclerotic calcifications in the thoracic aorta. IMPRESSION: 1. Low lung volumes without radiographic evidence of acute cardiopulmonary disease. 2. Aortic atherosclerosis. Electronically Signed   By: Vinnie Langton M.D.   On: 04/29/2021 15:23   ECHOCARDIOGRAM COMPLETE  Result Date: 04/30/2021    ECHOCARDIOGRAM REPORT   Patient Name:   Kayla [name] Date of Exam: 04/30/2021 Medical Rec #:  [id number]      Height:       66.5 in Accession #:    [id number]     Weight:       166.9 lb Date of Birth:  [birth year]      BSA:          1.862 m Patient Age:    80 years       BP:           148/57 mmHg Patient Gender: F              HR:           47 bpm. Exam Location:  Inpatient Procedure: 2D Echo Indications:    second degree heart block  History:        Patient has prior history of Echocardiogram examinations, most                 recent 09/02/2014. CAD, Arrythmias:2nd degree av block; Risk                 Factors:Hypertension, Dyslipidemia  and Diabetes.  Sonographer:    Johny Chess RDCS Referring Phys: [place] Comments: Suboptimal parasternal window. IMPRESSIONS  1. Left ventricular ejection fraction, by estimation, is 60 to 65%. The left ventricle has normal function. The left ventricle has no regional wall motion abnormalities. Left ventricular diastolic parameters are indeterminate.  2. Right ventricular systolic function is normal. The right ventricular size is normal.  3. The mitral valve is normal in structure. Mild mitral valve regurgitation. No evidence of mitral stenosis.  4. The aortic valve is normal in structure. Aortic valve  regurgitation is not visualized. No aortic stenosis is present.  5. The inferior vena cava is normal in size with greater than 50% respiratory variability, suggesting right atrial pressure of 3 mmHg. FINDINGS  Left Ventricle: Left ventricular ejection fraction, by estimation, is 60 to 65%. The left ventricle has normal function. The left ventricle has no regional wall motion abnormalities. The left ventricular internal cavity size was normal in size. There is  no left ventricular hypertrophy. Left ventricular diastolic parameters are indeterminate. Right Ventricle: The right ventricular size is normal. No increase in right ventricular wall thickness. Right ventricular systolic function is normal. Left Atrium: Left atrial size was normal in size. Right Atrium: Right atrial size was normal in size. Pericardium: There is no evidence of pericardial effusion. Presence of pericardial fat pad. Mitral Valve: The mitral valve is normal in structure. Mild mitral valve regurgitation. No evidence of mitral valve stenosis. Tricuspid Valve: The tricuspid valve is normal in structure. Tricuspid valve regurgitation is not demonstrated. No evidence of tricuspid stenosis. Aortic Valve: The aortic valve is normal in structure. Aortic valve regurgitation is not visualized. No aortic stenosis is present. Aortic valve mean gradient measures 7.0 mmHg. Aortic valve peak gradient measures 14.0 mmHg. Pulmonic Valve: The pulmonic valve was normal in structure. Pulmonic valve regurgitation is not visualized. No evidence of pulmonic stenosis. Aorta: The aortic root is normal in size and structure. Venous: The inferior vena cava is normal in size with greater than 50% respiratory variability, suggesting right atrial pressure of 3 mmHg. IAS/Shunts: No atrial level shunt detected by color flow Doppler.  LEFT VENTRICLE PLAX 2D LVIDd:         3.50 cm Diastology LVIDs:         2.20 cm LV e' medial:    13.70 cm/s                        LV E/e' medial:   10.9                        LV e' lateral:   11.70 cm/s                        LV E/e' lateral: 12.8  RIGHT VENTRICLE             IVC RV S prime:     12.60 cm/s  IVC diam: 1.90 cm TAPSE (M-mode): 2.0 cm LEFT ATRIUM             Index        RIGHT ATRIUM           Index LA Vol (A2C):   30.1 ml 16.16 ml/m  RA Area:     11.60 cm LA Vol (A4C):   51.8 ml 27.82 ml/m  RA Volume:   25.80 ml  13.85 ml/m LA  Biplane Vol: 41.6 ml 22.34 ml/m  AORTIC VALVE AV Vmax:           187.00 cm/s AV Vmean:          118.000 cm/s AV VTI:            0.438 m AV Peak Grad:      14.0 mmHg AV Mean Grad:      7.0 mmHg LVOT Vmax:         145.00 cm/s LVOT Vmean:        95.400 cm/s LVOT VTI:          0.375 m LVOT/AV VTI ratio: 0.86  AORTA Ao Asc diam: 3.10 cm MITRAL VALVE MV Area (PHT): 4.63 cm     SHUNTS MV Decel Time: 164 msec     Systemic VTI: 0.38 m MV E velocity: 150.00 cm/s MV A velocity: 139.00 cm/s MV E/A ratio:  1.08 Kardie Tobb DO Electronically signed by Berniece Salines DO Signature Date/Time: 04/30/2021/6:06:49 PM    Final     ECG & Cardiac Imaging    Sinus rhythm with complete heart block and likely fascicular escape rhythm at 30 bpm - personally reviewed.  Assessment & Plan    Complete heart block/high grade AV block AKI on CKD 3 with mild hyperkalemia Chronic CAD s/p PCI of D1 and LAD in 2016 Type 2 myocardial injury, likely due to #1, 2, and 3 Recent COVID-19 infection with moderate malnutrition and suspected hypovolemia Type 2 diabetes with CKD and neuropathy, not at goal History of stroke with possible dementia  Kayla [name] has evidence of significant infrahisian conduction disease and has presented twice now in the last week with second-third degree AV block associated with AKI, though hemodynamically stable. She has now had full washout of the atenolol, though still demonstrated complete heart block with a potassium of just 5.5. Unclear if poor intake and dehydration was the inciting factor for the AKI, but  suspect this was multifactorial with bradycardia contributing. She otherwise has been normotensive. The change in her mentation is likely due more to uremia than bradycardia, as this has persisted despite improved conduction (now 1:1). Regardless, this is likely a chronic degenerative process, as evidenced by baseline conduction disease and multiple presentations for AV block, and pacing should be strongly considered.   - Will consult with EP and discuss pacing option with the patient and her family. - Keep on telemetry at all times with pacing pads in place. - Okay for stepdown at this time, but if recurrent CHB, would transfer to ICU. - If unstable bradycardia, start dopamine 10 mcg/kg/min, manage per ACLS, and notify cardiology. - Monitor BMP/mag at least every 12 hours with goal K 3.5-5 and Mag > 2 - Avoid all AV-nodal blockers - Okay for IV fluids from volume standpoint - No indication for ACS-directed treatment at this time (heparin, etc), but would continue clopidogrel and statin for secondary prevention  Signed, Marykay Lex, MD 05/03/2021, 10:54 PM  For questions or updates, please contact   Please consult [email or web address] for contact info under Cardiology/STEMI.

## 2021-05-03 NOTE — ED Provider Notes (Signed)
[facility] EMERGENCY DEPARTMENT Provider Note   CSN: [id number] Arrival date & time: 05/03/21  1856     History No chief complaint on file.   Kayla [name] is a 80 y.o. female.  HPI Patient presents for bradycardia.  She had a recent hospital admission for the same.  4 days ago, she arrived in the ED with heart rate of 40.  EKG at that time showed normal atrial rate with 2-1 AV conduction.  She was admitted and her home Tylenol was held.  Per chart review, her heart rate normalized and she was discharged 2 days ago.  Today, family was checking vital signs and found her to have a heart rate in the 30s.  EMS was called.  Twelve-lead EKG shows concern of complete heart block.  Patient's blood pressures have been normal.  Patient currently complains of no new symptoms.  She has reportedly had decreased p.o. intake since her recent COVID infection.  This has continued since she left the hospital.  She has also had persistent cough.    Past Medical History:  Diagnosis Date   Allergy    Shell fish   Anxiety    Arthritis    "knees, back, probably left hand" (01/14/2015)   Asthmatic bronchitis    Blind left eye    CAD (coronary artery disease)    cath in 2016 showing 40% ostial RCA, 40% ostial D1, 90% proximal D1, 85% and 70% sequential distal LAD stenosis and underwent PCI with stents in the D1 and LAD.   Constipation    Depression    Diverticulosis of colon    DJD (degenerative joint disease)    Fibromyalgia    GERD (gastroesophageal reflux disease)    Heart murmur    History of gout    Hypercholesteremia    Hypertension    Personal history of noncompliance with medical treatment, presenting hazards to health    Prolapse of vaginal vault after hysterectomy    Proteinuria    Sickle-cell trait (HCC)    Somatic dysfunction    Stroke (HCC) ~ 08/2014   "blind in left eye; weak on left side since" (01/14/2015)   Type II diabetes mellitus (HCC)    Venous  insufficiency     Patient Active Problem List   Diagnosis Date Noted   Bradycardia 05/03/2021   Malnutrition of moderate degree 04/30/2021   CKD (chronic kidney disease) stage 3, GFR 30-59 ml/min (HCC) 04/29/2021   COVID-19 virus infection 04/29/2021   AV block, 2nd degree 04/29/2021   2nd degree AV block    CAD (coronary artery disease)    Vascular dementia (HCC) 12/29/2018   Acute pain of both knees 04/09/2018   Routine general medical examination at a health care facility 03/31/2018   Drooling 08/27/2017   Seizure as late effect of cerebrovascular accident (CVA) (HCC)    Blood in stool 02/26/2016   Decreased strength, endurance, and mobility 09/25/2015   Adjustment disorder with depressed mood 07/27/2015   Left homonymous hemianopsia 09/06/2014   Occipital infarction (HCC) 09/05/2014   Abnormal chest x-ray 08/22/2014   Type 2 diabetes mellitus with diabetic neuropathy (HCC) 01/04/2014   Hyperlipidemia associated with type 2 diabetes mellitus (HCC) 05/22/2009   Essential hypertension 05/04/2007   Osteoarthritis 05/04/2007   Fibromyalgia 05/04/2007    Past Surgical History:  Procedure Laterality Date   ABDOMINAL HYSTERECTOMY     CARDIAC CATHETERIZATION N/A 01/14/2015   Procedure: Left Heart Cath and Coronary Angiography;  Surgeon: Mohan   Terrence Dupont, MD;  Location: [facility];  Service: Cardiovascular;  Laterality: N/A;   CARDIAC CATHETERIZATION  ~ 2011   CATARACT EXTRACTION W/ INTRAOCULAR LENS  IMPLANT, BILATERAL Bilateral 2012   CORONARY ANGIOPLASTY     DILATION AND CURETTAGE OF UTERUS  "probably"   LOOP RECORDER IMPLANT N/A 09/03/2014   Procedure: LOOP RECORDER IMPLANT;  Surgeon: Thompson Grayer, MD;  Location: [facility] CATH [facility];  Service: Cardiovascular;  Laterality: N/A;   ROBOTIC ASSISTED LAPAROSCOPIC SACROCOLPOPEXY  09/2009   Dr. Matilde Sprang   TEE WITHOUT CARDIOVERSION N/A 09/02/2014   Procedure: TRANSESOPHAGEAL ECHOCARDIOGRAM (TEE);  Surgeon: Lelon Perla, MD;  Location:  [facility];  Service: Cardiovascular;  Laterality: N/A;     OB History   No obstetric history on file.     Family History  Problem Relation Age of Onset   Prostate cancer Father    Hypertension Father    Seizures Sister    Hypertension Sister    Bell's palsy Son    Heart attack Neg Hx    Stroke Neg Hx     Social History   Tobacco Use   Smoking status: Never   Smokeless tobacco: Never  Substance Use Topics   Alcohol use: Yes    Alcohol/week: 0.0 standard drinks    Comment: 01/14/2015 "might have a glass of wine a few times/yr"   Drug use: No    Home Medications Prior to Admission medications   Medication Sig Start Date End Date Taking? Authorizing Provider  acetaminophen (TYLENOL) 500 MG tablet Take 1,000 mg by mouth every 6 (six) hours as needed for moderate pain or headache.   Yes [provider]  atorvastatin (LIPITOR) 20 MG tablet Take 1 tablet (20 mg total) by mouth daily at 6 PM. Patient taking differently: Take 20 mg by mouth at bedtime. 02/19/15  Yes Hoyt Koch, MD  cetirizine (ZYRTEC) 10 MG tablet Take 10 mg by mouth at bedtime.   Yes [provider]  clopidogrel (PLAVIX) 75 MG tablet Take 1 tablet (75 mg total) by mouth daily. OFFICE VISIT IS DUE 07/01/20  Yes Hoyt Koch, MD  Cyanocobalamin (VITAMIN B-12) 2500 MCG SUBL Place 2,500 mcg under the tongue daily.   Yes [provider]  dextromethorphan-guaiFENesin (MUCINEX DM) 30-600 MG 12hr tablet Take 1 tablet by mouth 2 (two) times daily as needed for cough.   Yes [provider]  glucose blood test strip Use to check blood sugars twice daily Patient taking differently: Check blood sugar every morning 01/26/19  Yes Hoyt Koch, MD  glycopyrrolate (ROBINUL) 1 MG tablet Take 1 mg by mouth at bedtime. 03/27/21  Yes [provider]  hydrALAZINE (APRESOLINE) 50 MG tablet Take 1 tablet (50 mg total) by mouth 2 (two) times daily. Annual appt due in  July must see provider for future refills 11/20/19  Yes Hoyt Koch, MD  hydrochlorothiazide (HYDRODIURIL) 12.5 MG tablet Take 12.5 mg by mouth daily. 03/30/21  Yes [provider]  isosorbide mononitrate (IMDUR) 30 MG 24 hr tablet Take 30 mg by mouth daily. 02/13/21  Yes [provider]  JARDIANCE 10 MG TABS tablet TAKE 1 TABLET BY MOUTH EVERY DAY Patient taking differently: Take 10 mg by mouth daily. 02/23/19  Yes Hoyt Koch, MD  levETIRAcetam (KEPPRA) 500 MG tablet Take 500 mg by mouth at bedtime. 02/25/21  Yes [provider]  lisinopril (ZESTRIL) 20 MG tablet Take 1 tablet (20 mg total) by mouth daily. Needs office  visit 06/23/20  Yes Hoyt Koch, MD  Metamucil Fiber CHEW Chew 1 tablet by mouth daily.   Yes [provider]  nitroGLYCERIN (NITROSTAT) 0.4 MG SL tablet Place 0.4 mg under the tongue every 5 (five) minutes as needed for chest pain.   Yes [provider]  ONE TOUCH ULTRA TEST test strip USE TO TEST TWICE DAILY (BEFORE BREAKFAST, BEDTIME) E11.4 Patient taking differently: Check blood sugar every morning 01/03/18  Yes Hoyt Koch, MD  polyethylene glycol (MIRALAX / GLYCOLAX) packet Take 17 g by mouth daily as needed for moderate constipation.   Yes [provider]  vitamin E 180 MG (400 UNITS) capsule Take 400 Units by mouth daily.   Yes [provider]  blood glucose meter kit and supplies KIT Dispense what is covered by patients insurance with strips and lancets. Use twice a day (before breakfast and at bedtime). E11.40 01/26/19   Hoyt Koch, MD  glipiZIDE (GLUCOTROL XL) 2.5 MG 24 hr tablet Take 1 tablet (2.5 mg total) by mouth 2 (two) times daily. VISIT IS OVERDUE!!!!! Patient not taking: No sig reported 07/31/20   Hoyt Koch, MD  OneTouch Delica Lancets 21J MISC USE TO TEST BLOOD SUGAR TWICE DAILY Patient taking differently: Check blood sugar every morning 07/11/19    Hoyt Koch, MD  [facility] ULTRA test strip USE TO CHECK BLOOD SUGAR TWICE DAILY Patient not taking: Reported on 04/29/2021 12/12/20   Hoyt Koch, MD    Allergies    Amlodipine, Fish allergy, Penicillins, Shellfish allergy, Peanuts [peanut oil], and Tomato  Review of Systems   Review of Systems  Constitutional:  Positive for appetite change and fatigue. Negative for chills and fever.  HENT:  Negative for congestion, ear pain and sore throat.   Eyes:  Negative for pain and visual disturbance.  Respiratory:  Positive for cough. Negative for chest tightness and shortness of breath.   Cardiovascular:  Negative for chest pain and palpitations.  Gastrointestinal:  Negative for abdominal pain, diarrhea, nausea and vomiting.  Genitourinary:  Negative for dysuria, flank pain, hematuria and pelvic pain.  Musculoskeletal:  Negative for arthralgias, back pain, myalgias and neck pain.  Skin:  Negative for color change and rash.  Neurological:  Negative for dizziness, seizures, syncope, speech difficulty, weakness, light-headedness, numbness and headaches.  Psychiatric/Behavioral:  Negative for confusion and decreased concentration.   All other systems reviewed and are negative.  Physical Exam Updated Vital Signs BP 109/76   Pulse (!) 40   Resp (!) 24   SpO2 100%   Physical Exam Vitals and nursing note reviewed.  Constitutional:      General: She is not in acute distress.    Appearance: Normal appearance. She is well-developed. She is not ill-appearing, toxic-appearing or diaphoretic.  HENT:     Head: Normocephalic and atraumatic.     Right Ear: External ear normal.     Left Ear: External ear normal.     Nose: Nose normal.     Mouth/Throat:     Mouth: Mucous membranes are moist.     Pharynx: Oropharynx is clear.  Eyes:     Extraocular Movements: Extraocular movements intact.     Conjunctiva/sclera: Conjunctivae normal.  Cardiovascular:     Rate and Rhythm: Regular  rhythm. Bradycardia present.     Heart sounds: No murmur heard. Pulmonary:     Effort: Pulmonary effort is normal. No respiratory distress.     Breath sounds: Normal breath sounds. No wheezing  or rales.  Abdominal:     Palpations: Abdomen is soft.     Tenderness: There is no abdominal tenderness.  Musculoskeletal:        General: Normal range of motion.     Cervical back: Normal range of motion and neck supple.  Skin:    General: Skin is warm and dry.  Neurological:     General: No focal deficit present.     Mental Status: She is alert and oriented to person, place, and time.     Cranial Nerves: No cranial nerve deficit.     Motor: No weakness.  Psychiatric:        Mood and Affect: Mood normal.        Behavior: Behavior normal.        Thought Content: Thought content normal.        Judgment: Judgment normal.    ED Results / Procedures / Treatments   Labs (all labs ordered are listed, but only abnormal results are displayed) Labs Reviewed  BASIC METABOLIC PANEL - Abnormal; Notable for the following components:      Result Value   Potassium 5.5 (*)    CO2 17 (*)    Glucose, Bld 152 (*)    BUN 55 (*)    Creatinine, Ser 3.72 (*)    GFR, Estimated 12 (*)    All other components within normal limits  CBC - Abnormal; Notable for the following components:   RBC 5.43 (*)    Hemoglobin 15.9 (*)    HCT 50.5 (*)    RDW 18.0 (*)    All other components within normal limits  CBG MONITORING, ED - Abnormal; Notable for the following components:   Glucose-Capillary 142 (*)    All other components within normal limits  TROPONIN I (HIGH SENSITIVITY) - Abnormal; Notable for the following components:   Troponin I (High Sensitivity) 553 (*)    All other components within normal limits  RESP PANEL BY RT-PCR (FLU A&B, COVID) ARPGX2  MAGNESIUM  TROPONIN I (HIGH SENSITIVITY)    EKG EKG Interpretation  Date/Time:  Sunday May 03 2021 22:44:41 EST Ventricular Rate:  81 PR  Interval:  279 QRS Duration: 158 QT Interval:  476 QTC Calculation: 553 R Axis:   -43 Text Interpretation: Sinus rhythm Prolonged PR interval LAE, consider biatrial enlargement Left bundle branch block Confirmed by ,  ([id number]) on 05/03/2021 10:48:54 PM  Radiology DG Chest Portable 1 View  Result Date: 05/03/2021 CLINICAL DATA:  Bradycardia EXAM: PORTABLE CHEST 1 VIEW COMPARISON:  04/29/2021 FINDINGS: Lungs are symmetrically expanded and are clear. No pneumothorax or pleural effusion. Cardiac size within normal limits. Pulmonary vascularity is normal. Osseous structures are age-appropriate. No acute bone abnormality. IMPRESSION: No active disease. Electronically Signed   By: Ashesh  Parikh M.D.   On: 05/03/2021 20:30    Procedures Procedures   Medications Ordered in ED Medications  sodium chloride 0.9 % bolus 500 mL (500 mLs Intravenous New Bag/Given 05/03/21 2235)  sodium zirconium cyclosilicate (LOKELMA) packet 10 g (10 g Oral Given 05/03/21 2202)  calcium gluconate inj 10% (1 g) URGENT USE ONLY! (1 g Intravenous Given 05/03/21 2228)  insulin aspart (novoLOG) injection 5 Units (5 Units Intravenous Given 05/03/21 2213)    And  dextrose 50 % solution 50 mL (50 mLs Intravenous Given 05/03/21 2214)  aspirin chewable tablet 324 mg (324 mg Oral Given 05/03/21 2202)    ED Course  I have reviewed the triage vital signs and the nursing   notes.  Pertinent labs & imaging results that were available during my care of the patient were reviewed by me and considered in my medical decision making (see chart for details).    MDM Rules/Calculators/A&P                         CRITICAL CARE Performed by:     Total critical care time: 35 minutes  Critical care time was exclusive of separately billable procedures and treating other patients.  Critical care was necessary to treat or prevent imminent or life-threatening deterioration.  Critical care was time spent personally by me  on the following activities: development of treatment plan with patient and/or surrogate as well as nursing, discussions with consultants, evaluation of patient's response to treatment, examination of patient, obtaining history from patient or surrogate, ordering and performing treatments and interventions, ordering and review of laboratory studies, ordering and review of radiographic studies, pulse oximetry and re-evaluation of patient's condition.   Patient presents for bradycardia.  EKG shows complete heart block with a narrow complex escape rhythm.  She currently denies any new symptoms.  Blood pressures in the range of 130s over 50s.  Laboratory work-up was ordered to assess for underlying metabolic/electrolyte abnormalities.  Cardiology was consulted.  Patient will need to be admitted.  Recommendation is to start dopamine if the patient does become symptomatic.  She will likely require a pacemaker.  Admission level of care will be determined by lab work.  Patient's lab work showed hyperkalemia, AKI, and elevated troponin.  Temporizing medications were given for hyperkalemia.  Per cardiology recommendation, elevated troponin is to be expected in the setting of complete heart block.  No heparinization is indicated at this time.  We will continue to trend troponin.  Given her AKI, patient to be admitted to ICU.  On reassessment, patient continued to deny any complaints.  She is accompanied by family member at bedside.  Breathing remains even unlabored.  SPO2 remains normal on room air.  Following medications to temporize hyperkalemia, patient had increased heart rate.  Subsequent EKG shows LBBB with return of AV conduction.  Intensivist evaluated the patient in the ED and does not feel that she has any ICU needs at this time.  This was discussed with cardiologist on-call.  Patient was admitted to hospitalist for further management.  Cardiology to follow in consult.  Final Clinical Impression(s) / ED  Diagnoses Final diagnoses:  Bradycardia  Complete heart block (HCC)    Rx / DC Orders ED Discharge Orders     None        , , MD 05/03/21 2358  

## 2021-05-03 NOTE — Consult Note (Addendum)
NAME:  Kayla [name], MRN:  [id number], DOB:  January 14, 1941, LOS: 0 ADMISSION DATE:  05/03/2021, CONSULTATION DATE: 05/03/21 REFERRING MD:  [facility] , CHIEF COMPLAINT:  bradycardia    History of Present Illness:  80 yo woman with hx of bradycardia, recent COVID, recent admission for bradycardia, improved after beta blocker held, d/ced on 05/01/21.   Here after family found her hr to be very slow again, though asymptomatic.   Hyperkalemia and AKI.   Patient feels well.  HR improved at the time of my exam to 100, after she had been given insulin, D50, calcium, fluid bolus.     Pertinent  Medical History  Anxiety/ Depression CAD, S/p stents 2016 DJD Fibromyalgia GERD Gout HLD HTN Sickle cell trait,  Stroke hx  DM2    Significant Hospital Events: Including procedures, antibiotic start and stop dates in addition to other pertinent events     Interim History / Subjective:    Objective   Blood pressure 109/76, pulse (!) 40, resp. rate (!) 24, SpO2 100 %.       No intake or output data in the 24 hours ending 05/03/21 2340 There were no vitals filed for this visit.  Examination: General: NAD HENT: NCAT  Lungs: CTAB Cardiovascular: RRR no mgr Abdomen: Nt, ND, NBS Extremities: non pitting edema BLE Neuro: alert and oriented    Resolved Hospital Problem list     Assessment & Plan:  Bradycardia: complete heart block Improved with temporization treatment of hyperkalemia, though the hyperkalemia is mild.  Could be related to dehydration.  AKI - possibly 2/2 heart block.  Consider other causes.   Would recommend bladder scan, UA, Renal US.   At this point patient is stable for admission to [facility] hospitalist team (progressive vs tele).    Best Practice (right click and "Reselect all SmartList Selections" daily)    Labs   CBC: Recent Labs  Lab 04/29/21 1437 04/29/21 1542 05/03/21 2035  WBC 5.0  --  6.8  NEUTROABS 3.3  --   --   HGB 15.0 16.0* 15.9*  HCT 47.2*  47.0* 50.5*  MCV 91.5  --  93.0  PLT 226  --  800    Basic Metabolic Panel: Recent Labs  Lab 04/29/21 1437 04/29/21 1542 04/30/21 0149 05/01/21 0159 05/03/21 2035  NA 139 144 140 135 139  K 4.6 4.4 4.4 4.0 5.5*  CL 108 113* 111 105 109  CO2 20*  --  19* 20* 17*  GLUCOSE 142* 138* 101* 155* 152*  BUN 49* 48* 42* 37* 55*  CREATININE 1.98* 2.10* 1.64* 1.56* 3.72*  CALCIUM 8.7*  --  8.7* 8.5* 9.1  MG 2.6*  --   --   --  2.4   GFR: Estimated Creatinine Clearance: 12.5 mL/min (A) (by C-G formula based on SCr of 3.72 mg/dL (H)). Recent Labs  Lab 04/29/21 1437 05/03/21 2035  WBC 5.0 6.8    Liver Function Tests: Recent Labs  Lab 04/29/21 1437  AST 14*  ALT 11  ALKPHOS 86  BILITOT 0.9  PROT 7.2  ALBUMIN 3.2*   No results for input(s): LIPASE, AMYLASE in the last 168 hours. No results for input(s): AMMONIA in the last 168 hours.  ABG    Component Value Date/Time   PHART 7.256 (L) 06/20/2016 0026   PCO2ART 40.1 06/20/2016 0026   PO2ART 108.0 06/20/2016 0026   HCO3 17.9 (L) 06/20/2016 0026   TCO2 22 04/29/2021 1542   ACIDBASEDEF 9.0 (H) 06/20/2016  0026   O2SAT 97.0 06/20/2016 0026     Coagulation Profile: No results for input(s): INR, PROTIME in the last 168 hours.  Cardiac Enzymes: No results for input(s): CKTOTAL, CKMB, CKMBINDEX, TROPONINI in the last 168 hours.  HbA1C: Hemoglobin A1C  Date/Time Value Ref Range Status  04/13/2019 02:18 PM 7.5 (A) 4.0 - 5.6 % Final   Hgb A1c MFr Bld  Date/Time Value Ref Range Status  04/30/2021 01:49 AM 8.7 (H) 4.8 - 5.6 % Final    Comment:    (NOTE) Pre diabetes:          5.7%-6.4%  Diabetes:              >6.4%  Glycemic control for   <7.0% adults with diabetes   12/29/2018 12:07 PM 7.7 (H) 4.6 - 6.5 % Final    Comment:    Glycemic Control Guidelines for People with Diabetes:Non Diabetic:  <6%Goal of Therapy: <7%Additional Action Suggested:  >8%     CBG: Recent Labs  Lab 04/30/21 1112 04/30/21 1819  04/30/21 2127 05/01/21 0617 05/03/21 2201  GLUCAP 199* 163* 222* 146* 142*    Review of Systems:   Review of Systems  Constitutional:  Negative for chills and fever.  HENT:  Positive for hearing loss. Negative for tinnitus.   Eyes:  Negative for blurred vision.  Respiratory:  Negative for cough.   Cardiovascular:  Negative for chest pain.  Gastrointestinal:  Negative for heartburn.  Genitourinary:  Negative for dysuria.  Musculoskeletal:  Negative for myalgias.  Skin:  Negative for rash.  Neurological:  Negative for dizziness.  Endo/Heme/Allergies:  Does not bruise/bleed easily.  Psychiatric/Behavioral:  Negative for depression.     Past Medical History:  She,  has a past medical history of Allergy, Anxiety, Arthritis, Asthmatic bronchitis, Blind left eye, CAD (coronary artery disease), Constipation, Depression, Diverticulosis of colon, DJD (degenerative joint disease), Fibromyalgia, GERD (gastroesophageal reflux disease), Heart murmur, History of gout, Hypercholesteremia, Hypertension, Personal history of noncompliance with medical treatment, presenting hazards to health, Prolapse of vaginal vault after hysterectomy, Proteinuria, Sickle-cell trait ([place]), Somatic dysfunction, Stroke ([place]) (~ 08/2014), Type II diabetes mellitus ([place]), and Venous insufficiency.   Surgical History:   Past Surgical History:  Procedure Laterality Date   ABDOMINAL HYSTERECTOMY     CARDIAC CATHETERIZATION N/A 01/14/2015   Procedure: Left Heart Cath and Coronary Angiography;  Surgeon: Charolette Forward, MD;  Location: [facility];  Service: Cardiovascular;  Laterality: N/A;   CARDIAC CATHETERIZATION  ~ 2011   CATARACT EXTRACTION W/ INTRAOCULAR LENS  IMPLANT, BILATERAL Bilateral 2012   CORONARY ANGIOPLASTY     DILATION AND CURETTAGE OF UTERUS  "probably"   LOOP RECORDER IMPLANT N/A 09/03/2014   Procedure: LOOP RECORDER IMPLANT;  Surgeon: Thompson Grayer, MD;  Location: [facility] CATH LAB;  Service: Cardiovascular;   Laterality: N/A;   ROBOTIC ASSISTED LAPAROSCOPIC SACROCOLPOPEXY  09/2009   Dr. Matilde Sprang   TEE WITHOUT CARDIOVERSION N/A 09/02/2014   Procedure: TRANSESOPHAGEAL ECHOCARDIOGRAM (TEE);  Surgeon: Lelon Perla, MD;  Location: [facility] ENDOSCOPY;  Service: Cardiovascular;  Laterality: N/A;     Social History:   reports that she has never smoked. She has never used smokeless tobacco. She reports current alcohol use. She reports that she does not use drugs.   Family History:  Her family history includes Bell's palsy in her son; Hypertension in her father and sister; Prostate cancer in her father; Seizures in her sister. There is no history of Heart attack or Stroke.  Allergies Allergies  Allergen Reactions   Amlodipine Shortness Of Breath and Rash   Fish Allergy Hives   Penicillins Other (See Comments)    REACTION: red rash   Shellfish Allergy Hives   Peanuts [Peanut Oil] Rash   Tomato Rash     Home Medications  Prior to Admission medications   Medication Sig Start Date End Date Taking? Authorizing Provider  acetaminophen (TYLENOL) 500 MG tablet Take 1,000 mg by mouth every 6 (six) hours as needed for moderate pain or headache.   Yes [provider]  atorvastatin (LIPITOR) 20 MG tablet Take 1 tablet (20 mg total) by mouth daily at 6 PM. Patient taking differently: Take 20 mg by mouth at bedtime. 02/19/15  Yes Hoyt Koch, MD  cetirizine (ZYRTEC) 10 MG tablet Take 10 mg by mouth at bedtime.   Yes [provider]  clopidogrel (PLAVIX) 75 MG tablet Take 1 tablet (75 mg total) by mouth daily. OFFICE VISIT IS DUE 07/01/20  Yes Hoyt Koch, MD  Cyanocobalamin (VITAMIN B-12) 2500 MCG SUBL Place 2,500 mcg under the tongue daily.   Yes [provider]  dextromethorphan-guaiFENesin (MUCINEX DM) 30-600 MG 12hr tablet Take 1 tablet by mouth 2 (two) times daily as needed for cough.   Yes [provider]  glucose blood test strip Use to check blood  sugars twice daily Patient taking differently: Check blood sugar every morning 01/26/19  Yes Hoyt Koch, MD  glycopyrrolate (ROBINUL) 1 MG tablet Take 1 mg by mouth at bedtime. 03/27/21  Yes [provider]  hydrALAZINE (APRESOLINE) 50 MG tablet Take 1 tablet (50 mg total) by mouth 2 (two) times daily. Annual appt due in July must see provider for future refills 11/20/19  Yes Hoyt Koch, MD  hydrochlorothiazide (HYDRODIURIL) 12.5 MG tablet Take 12.5 mg by mouth daily. 03/30/21  Yes [provider]  isosorbide mononitrate (IMDUR) 30 MG 24 hr tablet Take 30 mg by mouth daily. 02/13/21  Yes [provider]  JARDIANCE 10 MG TABS tablet TAKE 1 TABLET BY MOUTH EVERY DAY Patient taking differently: Take 10 mg by mouth daily. 02/23/19  Yes Hoyt Koch, MD  levETIRAcetam (KEPPRA) 500 MG tablet Take 500 mg by mouth at bedtime. 02/25/21  Yes [provider]  lisinopril (ZESTRIL) 20 MG tablet Take 1 tablet (20 mg total) by mouth daily. Needs office visit 06/23/20  Yes Hoyt Koch, MD  Metamucil Fiber CHEW Chew 1 tablet by mouth daily.   Yes [provider]  nitroGLYCERIN (NITROSTAT) 0.4 MG SL tablet Place 0.4 mg under the tongue every 5 (five) minutes as needed for chest pain.   Yes [provider]  ONE TOUCH ULTRA TEST test strip USE TO TEST TWICE DAILY (BEFORE BREAKFAST, BEDTIME) E11.4 Patient taking differently: Check blood sugar every morning 01/03/18  Yes Hoyt Koch, MD  polyethylene glycol (MIRALAX / GLYCOLAX) packet Take 17 g by mouth daily as needed for moderate constipation.   Yes [provider]  vitamin E 180 MG (400 UNITS) capsule Take 400 Units by mouth daily.   Yes [provider]  blood glucose meter kit and supplies KIT Dispense what is covered by patients insurance with strips and lancets. Use twice a day (before breakfast and at bedtime). E11.40 01/26/19   Hoyt Koch, MD   glipiZIDE (GLUCOTROL XL) 2.5 MG 24 hr tablet Take 1 tablet (2.5 mg total) by mouth 2 (two) times daily. VISIT IS OVERDUE!!!!! Patient not taking:  No sig reported 07/31/20   Hoyt Koch, MD  OneTouch Delica Lancets 35W MISC USE TO TEST BLOOD SUGAR TWICE DAILY Patient taking differently: Check blood sugar every morning 07/11/19   Hoyt Koch, MD  [facility] ULTRA test strip USE TO CHECK BLOOD SUGAR TWICE DAILY Patient not taking: Reported on 04/29/2021 12/12/20   Hoyt Koch, MD     Critical care time: 45 min

## 2021-05-03 NOTE — Consult Note (Signed)
Cardiology Consult    Patient ID: [name] MRN: [id number], DOB/AGE: 1940/09/17   Admit date: 05/03/2021 Date of Consult: 05/03/2021 Requesting Provider: Godfrey Pick, MD  PCP:  Cipriano Mile, NP   [facility] Providers Cardiologist:  Fransico Him, MD       Patient Profile    Kayla [name] is a 80 y.o. 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 She is being seen today (05/03/2021) for the evaluation of bradycardia.  History of Present Illness    Recently admitted from 11/9 to 11/11 for 2:1 heart block after presenting with loss of appetite and lethargy. ECG showed 2:1 AV block with new LBBB and PR of 200 ms. She also had an AKI with Cr of 2.1 from baseline of around 1.5, improving by discharge with IV fluids. TTE was unrevealing. Conduction improved with discontinuation of atenolol and she was discharged home with plan for observation.   Since discharge her appetite has continued to be very poor. Today family felt that she seemed more confused and not making sense. They checked her VS and found her HR to be in the 30s but normotensive. On arrival to the ED she was found to be in complete heart block with escape rhythm around 30 bpm, but normotensive and reported feeling well. No reported syncope. Labs were notable for significant AKI with Cr of 3.7 from 1.56 just 2 days ago and K of 5.5. Troponin around 500. She was treated for hyperkalemia and conduction improved to 1:1 around 11pm. Currently, the patient is unable to answer any of my questions, responding only with "what did you say", "whats going on".   Past Medical History   Past Medical History:  Diagnosis Date   Allergy    Shell fish   Anxiety    Arthritis    "knees, back, probably left hand" (01/14/2015)   Asthmatic bronchitis    Blind  left eye    CAD (coronary artery disease)    cath in 2016 showing 40% ostial RCA, 40% ostial D1, 90% proximal D1, 85% and 70% sequential distal LAD stenosis and underwent PCI with stents in the D1 and LAD.   Constipation    Depression    Diverticulosis of colon    DJD (degenerative joint disease)    Fibromyalgia    GERD (gastroesophageal reflux disease)    Heart murmur    History of gout    Hypercholesteremia    Hypertension    Personal history of noncompliance with medical treatment, presenting hazards to health    Prolapse of vaginal vault after hysterectomy    Proteinuria    Sickle-cell trait ([place])    Somatic dysfunction    Stroke ([place]) ~ 08/2014   "blind in left eye; weak on left side since" (01/14/2015)   Type II diabetes mellitus ([place])    Venous insufficiency     Past Surgical History:  Procedure Laterality Date   ABDOMINAL HYSTERECTOMY     CARDIAC CATHETERIZATION N/A 01/14/2015   Procedure: Left Heart Cath and Coronary Angiography;  Surgeon: Charolette Forward, MD;  Location: [facility];  Service: Cardiovascular;  Laterality: N/A;   CARDIAC CATHETERIZATION  ~ 2011   CATARACT EXTRACTION W/ INTRAOCULAR LENS  IMPLANT, BILATERAL Bilateral 2012   CORONARY ANGIOPLASTY     DILATION AND CURETTAGE OF UTERUS  "  probably"   LOOP RECORDER IMPLANT N/A 09/03/2014   Procedure: LOOP RECORDER IMPLANT;  Surgeon: Thompson Grayer, MD;  Location: [facility] CATH LAB;  Service: Cardiovascular;  Laterality: N/A;   ROBOTIC ASSISTED LAPAROSCOPIC SACROCOLPOPEXY  09/2009   Dr. Matilde Sprang   TEE WITHOUT CARDIOVERSION N/A 09/02/2014   Procedure: TRANSESOPHAGEAL ECHOCARDIOGRAM (TEE);  Surgeon: Lelon Perla, MD;  Location: [facility] ENDOSCOPY;  Service: Cardiovascular;  Laterality: N/A;     Allergies  Allergen Reactions   Amlodipine Shortness Of Breath and Rash   Fish Allergy Hives   Penicillins Other (See Comments)    REACTION: red rash   Shellfish Allergy Hives   Peanuts [Peanut Oil] Rash   Tomato Rash    Inpatient Medications      Family History    Family History  Problem Relation Age of Onset   Prostate cancer Father    Hypertension Father    Seizures Sister    Hypertension Sister    Bell's palsy Son    Heart attack Neg Hx    Stroke Neg Hx    She indicated that her mother is deceased. She indicated that her father is deceased. She indicated that one of her two sisters is deceased. She indicated that her brother is deceased. She indicated that her son is alive. She indicated that the status of her neg hx is unknown.   Social History    Social History   Socioeconomic History   Marital status: Married    Spouse name: Not on file   Number of children: 4   Years of education: 13   Highest education level: Not on file  Occupational History   Occupation: [employer]    Comment: retired  Tobacco Use   Smoking status: Never   Smokeless tobacco: Never  Substance and Sexual Activity   Alcohol use: Yes    Alcohol/week: 0.0 standard drinks    Comment: 01/14/2015 "might have a glass of wine a few times/yr"   Drug use: No   Sexual activity: Never  Other Topics Concern   Not on file  Social History Narrative   Married   Right handed   Caffeine use - 1 cup coffee daily   Social Determinants of Health   Financial Resource Strain: Not on file  Food Insecurity: Not on file  Transportation Needs: Not on file  Physical Activity: Not on file  Stress: Not on file  Social Connections: Not on file  Intimate Partner Violence: Not on file     Review of Systems    Unable to obtain due to patient condition  Physical Exam    Blood pressure (!) 136/38, pulse (!) 40, resp. rate 19, SpO2 100 %.    No intake or output data in the 24 hours ending 05/03/21 2254 Wt Readings from Last 3 Encounters:  04/30/21 75.7 kg  05/23/19 81.2 kg  05/11/19 81.2 kg    CONSTITUTIONAL: drowsy, not responding appropriately to questions, in no acute distress HEENT: normal NECK: no JVD, no  masses CARDIAC: Regular rhythm, currently sinus rhythm with 1:1 conduction. Normal S1/S2, no S3/S4. No murmur. No friction rub.  VASCULAR: Radial pulses intact bilaterally. PULMONARY/CHEST WALL: no deformities, normal breath sounds bilaterally, normal work of breathing ABDOMINAL: soft, non-tender, non-distended EXTREMITIES: no edema, mild muscle atrophy, warm and well-perfused SKIN: Dry and intact without apparent rashes or wounds. No peripheral cyanosis. NEUROLOGIC: drowsy but attentive, does not respond appropriately to questions, mild asterixis present   Labs    Recent Labs  05/03/21 2035  TROPONINIHS 553*   Lab Results  Component Value Date   WBC 6.8 05/03/2021   HGB 15.9 (H) 05/03/2021   HCT 50.5 (H) 05/03/2021   MCV 93.0 05/03/2021   PLT 253 05/03/2021    Recent Labs  Lab 04/29/21 1437 04/29/21 1542 05/03/21 2035  NA 139   < > 139  K 4.6   < > 5.5*  CL 108   < > 109  CO2 20*   < > 17*  BUN 49*   < > 55*  CREATININE 1.98*   < > 3.72*  CALCIUM 8.7*   < > 9.1  PROT 7.2  --   --   BILITOT 0.9  --   --   ALKPHOS 86  --   --   ALT 11  --   --   AST 14*  --   --   GLUCOSE 142*   < > 152*   < > = values in this interval not displayed.   Lab Results  Component Value Date   CHOL 106 12/29/2018   HDL 35.10 (L) 12/29/2018   LDLCALC 44 12/29/2018   TRIG 136.0 12/29/2018      Radiology Studies    DG Chest Portable 1 View  Result Date: 05/03/2021 CLINICAL DATA:  Bradycardia EXAM: PORTABLE CHEST 1 VIEW COMPARISON:  04/29/2021 FINDINGS: Lungs are symmetrically expanded and are clear. No pneumothorax or pleural effusion. Cardiac size within normal limits. Pulmonary vascularity is normal. Osseous structures are age-appropriate. No acute bone abnormality. IMPRESSION: No active disease. Electronically Signed   By: Fidela Salisbury M.D.   On: 05/03/2021 20:30   DG Chest Portable 1 View  Result Date: 04/29/2021 CLINICAL DATA:  80 year old female with history of weakness.  EXAM: PORTABLE CHEST 1 VIEW COMPARISON:  Chest x-ray 06/19/2016. FINDINGS: Transcutaneous defibrillator pad projecting over the left hemithorax. Lung volumes are low. No consolidative airspace disease. No pleural effusions. No pneumothorax. No evidence of pulmonary edema. Heart size is normal. Mediastinal contours are distorted by patient positioning. Atherosclerotic calcifications in the thoracic aorta. IMPRESSION: 1. Low lung volumes without radiographic evidence of acute cardiopulmonary disease. 2. Aortic atherosclerosis. Electronically Signed   By: Vinnie Langton M.D.   On: 04/29/2021 15:23   ECHOCARDIOGRAM COMPLETE  Result Date: 04/30/2021    ECHOCARDIOGRAM REPORT   Patient Name:   Kayla [name] Date of Exam: 04/30/2021 Medical Rec #:  [id number]      Height:       66.5 in Accession #:    [id number]     Weight:       166.9 lb Date of Birth:  1941/02/13      BSA:          1.862 m Patient Age:    89 years       BP:           148/57 mmHg Patient Gender: F              HR:           47 bpm. Exam Location:  Inpatient Procedure: 2D Echo Indications:    second degree heart block  History:        Patient has prior history of Echocardiogram examinations, most                 recent 09/02/2014. CAD, Arrythmias:2nd degree av block; Risk                 Factors:Hypertension, Dyslipidemia  and Diabetes.  Sonographer:    Johny Chess RDCS Referring Phys: [place] Comments: Suboptimal parasternal window. IMPRESSIONS  1. Left ventricular ejection fraction, by estimation, is 60 to 65%. The left ventricle has normal function. The left ventricle has no regional wall motion abnormalities. Left ventricular diastolic parameters are indeterminate.  2. Right ventricular systolic function is normal. The right ventricular size is normal.  3. The mitral valve is normal in structure. Mild mitral valve regurgitation. No evidence of mitral stenosis.  4. The aortic valve is normal in structure. Aortic valve  regurgitation is not visualized. No aortic stenosis is present.  5. The inferior vena cava is normal in size with greater than 50% respiratory variability, suggesting right atrial pressure of 3 mmHg. FINDINGS  Left Ventricle: Left ventricular ejection fraction, by estimation, is 60 to 65%. The left ventricle has normal function. The left ventricle has no regional wall motion abnormalities. The left ventricular internal cavity size was normal in size. There is  no left ventricular hypertrophy. Left ventricular diastolic parameters are indeterminate. Right Ventricle: The right ventricular size is normal. No increase in right ventricular wall thickness. Right ventricular systolic function is normal. Left Atrium: Left atrial size was normal in size. Right Atrium: Right atrial size was normal in size. Pericardium: There is no evidence of pericardial effusion. Presence of pericardial fat pad. Mitral Valve: The mitral valve is normal in structure. Mild mitral valve regurgitation. No evidence of mitral valve stenosis. Tricuspid Valve: The tricuspid valve is normal in structure. Tricuspid valve regurgitation is not demonstrated. No evidence of tricuspid stenosis. Aortic Valve: The aortic valve is normal in structure. Aortic valve regurgitation is not visualized. No aortic stenosis is present. Aortic valve mean gradient measures 7.0 mmHg. Aortic valve peak gradient measures 14.0 mmHg. Pulmonic Valve: The pulmonic valve was normal in structure. Pulmonic valve regurgitation is not visualized. No evidence of pulmonic stenosis. Aorta: The aortic root is normal in size and structure. Venous: The inferior vena cava is normal in size with greater than 50% respiratory variability, suggesting right atrial pressure of 3 mmHg. IAS/Shunts: No atrial level shunt detected by color flow Doppler.  LEFT VENTRICLE PLAX 2D LVIDd:         3.50 cm Diastology LVIDs:         2.20 cm LV e' medial:    13.70 cm/s                        LV E/e' medial:   10.9                        LV e' lateral:   11.70 cm/s                        LV E/e' lateral: 12.8  RIGHT VENTRICLE             IVC RV S prime:     12.60 cm/s  IVC diam: 1.90 cm TAPSE (M-mode): 2.0 cm LEFT ATRIUM             Index        RIGHT ATRIUM           Index LA Vol (A2C):   30.1 ml 16.16 ml/m  RA Area:     11.60 cm LA Vol (A4C):   51.8 ml 27.82 ml/m  RA Volume:   25.80 ml  13.85 ml/m LA  Biplane Vol: 41.6 ml 22.34 ml/m  AORTIC VALVE AV Vmax:           187.00 cm/s AV Vmean:          118.000 cm/s AV VTI:            0.438 m AV Peak Grad:      14.0 mmHg AV Mean Grad:      7.0 mmHg LVOT Vmax:         145.00 cm/s LVOT Vmean:        95.400 cm/s LVOT VTI:          0.375 m LVOT/AV VTI ratio: 0.86  AORTA Ao Asc diam: 3.10 cm MITRAL VALVE MV Area (PHT): 4.63 cm     SHUNTS MV Decel Time: 164 msec     Systemic VTI: 0.38 m MV E velocity: 150.00 cm/s MV A velocity: 139.00 cm/s MV E/A ratio:  1.08 Kardie Tobb DO Electronically signed by Berniece Salines DO Signature Date/Time: 04/30/2021/6:06:49 PM    Final     ECG & Cardiac Imaging    Sinus rhythm with complete heart block and likely fascicular escape rhythm at 30 bpm - personally reviewed.  Assessment & Plan    Complete heart block/high grade AV block AKI on CKD 3 with mild hyperkalemia Chronic CAD s/p PCI of D1 and LAD in 2016 Type 2 myocardial injury, likely due to #1, 2, and 3 Recent COVID-19 infection with moderate malnutrition and suspected hypovolemia Type 2 diabetes with CKD and neuropathy, not at goal History of stroke with possible dementia  Ms. Danese has evidence of significant infrahisian conduction disease and has presented twice now in the last week with second-third degree AV block associated with AKI, though hemodynamically stable. She has now had full washout of the atenolol, though still demonstrated complete heart block with a potassium of just 5.5. Unclear if poor intake and dehydration was the inciting factor for the AKI, but  suspect this was multifactorial with bradycardia contributing. She otherwise has been normotensive. The change in her mentation is likely due more to uremia than bradycardia, as this has persisted despite improved conduction (now 1:1). Regardless, this is likely a chronic degenerative process, as evidenced by baseline conduction disease and multiple presentations for AV block, and pacing should be strongly considered.   - Will consult with EP and discuss pacing option with the patient and her family. - Keep on telemetry at all times with pacing pads in place. - Okay for stepdown at this time, but if recurrent CHB, would transfer to ICU. - If unstable bradycardia, start dopamine 10 mcg/kg/min, manage per ACLS, and notify cardiology. - Monitor BMP/mag at least every 12 hours with goal K 3.5-5 and Mag > 2 - Avoid all AV-nodal blockers - Okay for IV fluids from volume standpoint - No indication for ACS-directed treatment at this time (heparin, etc), but would continue clopidogrel and statin for secondary prevention  Signed, Marykay Lex, MD 05/03/2021, 10:54 PM  For questions or updates, please contact   Please consult [email or web address] for contact info under Cardiology/STEMI.

## 2021-05-03 NOTE — ED Notes (Signed)
Provided EDP with updated EKG

## 2021-05-03 NOTE — ED Triage Notes (Signed)
Pt bib ems from home c/o wellness check. Upon arrival ems seen she was bradycardic. Pt was previously here for same concerns. She recently was taken off her betablocker. That seem to correct the issue with the bradycardia. Family possibly think pt has dementia. Pt is alert to self, birthday, and location.  BP 140/70 HR 32 CBG 222

## 2021-05-04 ENCOUNTER — Encounter (HOSPITAL_COMMUNITY): Payer: Self-pay | Admitting: Internal Medicine

## 2021-05-04 ENCOUNTER — Inpatient Hospital Stay (HOSPITAL_COMMUNITY): Payer: Medicare (Managed Care)

## 2021-05-04 ENCOUNTER — Encounter (HOSPITAL_COMMUNITY)
Admission: EM | Disposition: A | Discharge status: Skilled Nursing Facility | Payer: Self-pay | Source: Home / Self Care | Attending: Critical Care Medicine

## 2021-05-04 DIAGNOSIS — I10 Essential (primary) hypertension: Secondary | ICD-10-CM

## 2021-05-04 DIAGNOSIS — I442 Atrioventricular block, complete: Secondary | ICD-10-CM | POA: Diagnosis present

## 2021-05-04 DIAGNOSIS — E872 Acidosis, unspecified: Secondary | ICD-10-CM | POA: Diagnosis present

## 2021-05-04 DIAGNOSIS — F0150 Vascular dementia without behavioral disturbance: Secondary | ICD-10-CM | POA: Diagnosis present

## 2021-05-04 DIAGNOSIS — K219 Gastro-esophageal reflux disease without esophagitis: Secondary | ICD-10-CM | POA: Diagnosis present

## 2021-05-04 DIAGNOSIS — R001 Bradycardia, unspecified: Secondary | ICD-10-CM | POA: Diagnosis not present

## 2021-05-04 DIAGNOSIS — Z8249 Family history of ischemic heart disease and other diseases of the circulatory system: Secondary | ICD-10-CM | POA: Diagnosis not present

## 2021-05-04 DIAGNOSIS — E875 Hyperkalemia: Secondary | ICD-10-CM | POA: Diagnosis present

## 2021-05-04 DIAGNOSIS — E440 Moderate protein-calorie malnutrition: Secondary | ICD-10-CM | POA: Diagnosis present

## 2021-05-04 DIAGNOSIS — E1122 Type 2 diabetes mellitus with diabetic chronic kidney disease: Secondary | ICD-10-CM | POA: Diagnosis present

## 2021-05-04 DIAGNOSIS — I69398 Other sequelae of cerebral infarction: Secondary | ICD-10-CM

## 2021-05-04 DIAGNOSIS — N179 Acute kidney failure, unspecified: Secondary | ICD-10-CM | POA: Diagnosis present

## 2021-05-04 DIAGNOSIS — R570 Cardiogenic shock: Secondary | ICD-10-CM | POA: Diagnosis present

## 2021-05-04 DIAGNOSIS — Z8616 Personal history of COVID-19: Secondary | ICD-10-CM | POA: Diagnosis not present

## 2021-05-04 DIAGNOSIS — E7800 Pure hypercholesterolemia, unspecified: Secondary | ICD-10-CM | POA: Diagnosis present

## 2021-05-04 DIAGNOSIS — R569 Unspecified convulsions: Secondary | ICD-10-CM

## 2021-05-04 DIAGNOSIS — E1165 Type 2 diabetes mellitus with hyperglycemia: Secondary | ICD-10-CM | POA: Diagnosis present

## 2021-05-04 DIAGNOSIS — G9341 Metabolic encephalopathy: Secondary | ICD-10-CM | POA: Diagnosis present

## 2021-05-04 DIAGNOSIS — E1140 Type 2 diabetes mellitus with diabetic neuropathy, unspecified: Secondary | ICD-10-CM

## 2021-05-04 DIAGNOSIS — Z7984 Long term (current) use of oral hypoglycemic drugs: Secondary | ICD-10-CM | POA: Diagnosis not present

## 2021-05-04 DIAGNOSIS — I129 Hypertensive chronic kidney disease with stage 1 through stage 4 chronic kidney disease, or unspecified chronic kidney disease: Secondary | ICD-10-CM | POA: Diagnosis present

## 2021-05-04 DIAGNOSIS — Z90710 Acquired absence of both cervix and uterus: Secondary | ICD-10-CM | POA: Diagnosis not present

## 2021-05-04 DIAGNOSIS — M797 Fibromyalgia: Secondary | ICD-10-CM | POA: Diagnosis present

## 2021-05-04 DIAGNOSIS — N1832 Chronic kidney disease, stage 3b: Secondary | ICD-10-CM | POA: Diagnosis present

## 2021-05-04 DIAGNOSIS — D573 Sickle-cell trait: Secondary | ICD-10-CM | POA: Diagnosis present

## 2021-05-04 DIAGNOSIS — G40909 Epilepsy, unspecified, not intractable, without status epilepticus: Secondary | ICD-10-CM | POA: Diagnosis present

## 2021-05-04 DIAGNOSIS — I2510 Atherosclerotic heart disease of native coronary artery without angina pectoris: Secondary | ICD-10-CM | POA: Diagnosis present

## 2021-05-04 HISTORY — PX: TEMPORARY PACEMAKER: CATH118268

## 2021-05-04 LAB — URINALYSIS, COMPLETE (UACMP) WITH MICROSCOPIC
Bilirubin Urine: NEGATIVE
Glucose, UA: >=500 mg/dL — AB
Hgb urine dipstick: NEGATIVE
Ketones, ur: NEGATIVE mg/dL
Leukocytes,Ua: NEGATIVE
Nitrite: NEGATIVE
Protein, ur: NEGATIVE mg/dL
Specific Gravity, Urine: 1.02 (ref 1.005–1.030)
pH: 5.5 (ref 5.0–8.0)

## 2021-05-04 LAB — RENAL FUNCTION PANEL
Albumin: 2.8 g/dL — ABNORMAL LOW (ref 3.5–5.0)
Albumin: 2.7 g/dL — ABNORMAL LOW (ref 3.5–5.0)
Anion gap: 11 (ref 5–15)
Anion gap: 10 (ref 5–15)
BUN: 47 mg/dL — ABNORMAL HIGH (ref 8–23)
BUN: 47 mg/dL — ABNORMAL HIGH (ref 8–23)
CO2: 20 mmol/L — ABNORMAL LOW (ref 22–32)
CO2: 25 mmol/L (ref 22–32)
Calcium: 8.5 mg/dL — ABNORMAL LOW (ref 8.9–10.3)
Calcium: 8.1 mg/dL — ABNORMAL LOW (ref 8.9–10.3)
Chloride: 109 mmol/L (ref 98–111)
Chloride: 102 mmol/L (ref 98–111)
Creatinine, Ser: 2.81 mg/dL — ABNORMAL HIGH (ref 0.44–1.00)
Creatinine, Ser: 2.56 mg/dL — ABNORMAL HIGH (ref 0.44–1.00)
GFR, Estimated: 16 mL/min — ABNORMAL LOW (ref >60)
GFR, Estimated: 18 mL/min — ABNORMAL LOW (ref >60)
Glucose, Bld: 188 mg/dL — ABNORMAL HIGH (ref 70–99)
Glucose, Bld: 185 mg/dL — ABNORMAL HIGH (ref 70–99)
Phosphorus: 4.2 mg/dL (ref 2.5–4.6)
Phosphorus: 3.5 mg/dL (ref 2.5–4.6)
Potassium: 3.9 mmol/L (ref 3.5–5.1)
Potassium: 3.4 mmol/L — ABNORMAL LOW (ref 3.5–5.1)
Sodium: 140 mmol/L (ref 135–145)
Sodium: 137 mmol/L (ref 135–145)

## 2021-05-04 LAB — CBC WITH DIFFERENTIAL/PLATELET
Abs Immature Granulocytes: 0.11 10*3/uL — ABNORMAL HIGH (ref 0.00–0.07)
Basophils Absolute: 0.1 10*3/uL (ref 0.0–0.1)
Basophils Relative: 1 %
Eosinophils Absolute: 0.2 10*3/uL (ref 0.0–0.5)
Eosinophils Relative: 3 %
HCT: 46.7 % — ABNORMAL HIGH (ref 36.0–46.0)
Hemoglobin: 14.7 g/dL (ref 12.0–15.0)
Immature Granulocytes: 2 %
Lymphocytes Relative: 22 %
Lymphs Abs: 1.3 10*3/uL (ref 0.7–4.0)
MCH: 28.8 pg (ref 26.0–34.0)
MCHC: 31.5 g/dL (ref 30.0–36.0)
MCV: 91.6 fL (ref 80.0–100.0)
Monocytes Absolute: 0.9 10*3/uL (ref 0.1–1.0)
Monocytes Relative: 15 %
Neutro Abs: 3.4 10*3/uL (ref 1.7–7.7)
Neutrophils Relative %: 57 %
Platelets: 234 10*3/uL (ref 150–400)
RBC: 5.1 MIL/uL (ref 3.87–5.11)
RDW: 17.7 % — ABNORMAL HIGH (ref 11.5–15.5)
WBC: 6 10*3/uL (ref 4.0–10.5)
nRBC: 0 % (ref 0.0–0.2)

## 2021-05-04 LAB — BASIC METABOLIC PANEL
Anion gap: 14 (ref 5–15)
BUN: 55 mg/dL — ABNORMAL HIGH (ref 8–23)
CO2: 14 mmol/L — ABNORMAL LOW (ref 22–32)
Calcium: 8.7 mg/dL — ABNORMAL LOW (ref 8.9–10.3)
Chloride: 111 mmol/L (ref 98–111)
Creatinine, Ser: 3.51 mg/dL — ABNORMAL HIGH (ref 0.44–1.00)
GFR, Estimated: 13 mL/min — ABNORMAL LOW (ref >60)
Glucose, Bld: 138 mg/dL — ABNORMAL HIGH (ref 70–99)
Potassium: 5.3 mmol/L — ABNORMAL HIGH (ref 3.5–5.1)
Sodium: 139 mmol/L (ref 135–145)

## 2021-05-04 LAB — MAGNESIUM: Magnesium: 2.5 mg/dL — ABNORMAL HIGH (ref 1.7–2.4)

## 2021-05-04 LAB — LACTIC ACID, PLASMA: Lactic Acid, Venous: 0.7 mmol/L (ref 0.5–1.9)

## 2021-05-04 LAB — RESP PANEL BY RT-PCR (FLU A&B, COVID) ARPGX2
Influenza A by PCR: NEGATIVE
Influenza B by PCR: NEGATIVE
SARS Coronavirus 2 by RT PCR: POSITIVE — AB

## 2021-05-04 LAB — GLUCOSE, CAPILLARY
Glucose-Capillary: 170 mg/dL — ABNORMAL HIGH (ref 70–99)
Glucose-Capillary: 180 mg/dL — ABNORMAL HIGH (ref 70–99)
Glucose-Capillary: 182 mg/dL — ABNORMAL HIGH (ref 70–99)

## 2021-05-04 LAB — HEPATIC FUNCTION PANEL
ALT: 18 U/L (ref 0–44)
AST: 30 U/L (ref 15–41)
Albumin: 3 g/dL — ABNORMAL LOW (ref 3.5–5.0)
Alkaline Phosphatase: 59 U/L (ref 38–126)
Bilirubin, Direct: 0.3 mg/dL — ABNORMAL HIGH (ref 0.0–0.2)
Indirect Bilirubin: 1.1 mg/dL — ABNORMAL HIGH (ref 0.3–0.9)
Total Bilirubin: 1.4 mg/dL — ABNORMAL HIGH (ref 0.3–1.2)
Total Protein: 6.4 g/dL — ABNORMAL LOW (ref 6.5–8.1)

## 2021-05-04 LAB — PROTEIN / CREATININE RATIO, URINE
Creatinine, Urine: 142.32 mg/dL
Protein Creatinine Ratio: 0.31 mg/mg{Cre} — ABNORMAL HIGH (ref 0.00–0.15)
Total Protein, Urine: 44 mg/dL

## 2021-05-04 LAB — NA AND K (SODIUM & POTASSIUM), RAND UR
Potassium Urine: 33 mmol/L
Sodium, Ur: 65 mmol/L

## 2021-05-04 LAB — TSH: TSH: 1.26 u[IU]/mL (ref 0.350–4.500)

## 2021-05-04 LAB — SURGICAL PCR SCREEN
MRSA, PCR: NEGATIVE
Staphylococcus aureus: NEGATIVE

## 2021-05-04 LAB — TROPONIN I (HIGH SENSITIVITY)
Troponin I (High Sensitivity): 335 ng/L (ref <18)
Troponin I (High Sensitivity): 243 ng/L (ref <18)

## 2021-05-04 SURGERY — TEMPORARY PACEMAKER
Anesthesia: LOCAL

## 2021-05-04 MED ORDER — MOMETASONE FURO-FORMOTEROL FUM 100-5 MCG/ACT IN AERO
2.0000 | INHALATION_SPRAY | Freq: Two times a day (BID) | RESPIRATORY_TRACT | Status: DC
  Administered 2021-05-04 – 2021-05-11 (×11): 2 via RESPIRATORY_TRACT
  Filled 2021-05-04 (×3): qty 8.8

## 2021-05-04 MED ORDER — ATORVASTATIN CALCIUM 10 MG PO TABS
20.0000 mg | ORAL_TABLET | Freq: Every day | ORAL | Status: DC
  Administered 2021-05-04 – 2021-05-10 (×7): 20 mg via ORAL
  Filled 2021-05-04 (×7): qty 2

## 2021-05-04 MED ORDER — GUAIFENESIN ER 600 MG PO TB12
600.0000 mg | ORAL_TABLET | Freq: Two times a day (BID) | ORAL | Status: DC
  Administered 2021-05-04 – 2021-05-08 (×9): 600 mg via ORAL
  Filled 2021-05-04 (×9): qty 1

## 2021-05-04 MED ORDER — HEPARIN (PORCINE) IN NACL 1000-0.9 UT/500ML-% IV SOLN
INTRAVENOUS | Status: DC | PRN
  Administered 2021-05-04: 500 mL

## 2021-05-04 MED ORDER — LIDOCAINE HCL (PF) 1 % IJ SOLN
INTRAMUSCULAR | Status: DC | PRN
  Administered 2021-05-04: 5 mL

## 2021-05-04 MED ORDER — LIDOCAINE HCL (PF) 1 % IJ SOLN
INTRAMUSCULAR | Status: AC
  Filled 2021-05-04: qty 30

## 2021-05-04 MED ORDER — CLOPIDOGREL BISULFATE 75 MG PO TABS
75.0000 mg | ORAL_TABLET | Freq: Every day | ORAL | Status: DC
  Administered 2021-05-04: 75 mg via ORAL
  Filled 2021-05-04: qty 1

## 2021-05-04 MED ORDER — VITAMIN B-12 1000 MCG PO TABS
2500.0000 ug | ORAL_TABLET | Freq: Every day | ORAL | Status: DC
  Administered 2021-05-04 – 2021-05-11 (×8): 2500 ug via ORAL
  Filled 2021-05-04 (×8): qty 3

## 2021-05-04 MED ORDER — NITROGLYCERIN 0.4 MG SL SUBL: 0.4000 mg | SUBLINGUAL_TABLET | SUBLINGUAL | Status: DC | PRN

## 2021-05-04 MED ORDER — ALBUTEROL SULFATE HFA 108 (90 BASE) MCG/ACT IN AERS
1.0000 | INHALATION_SPRAY | Freq: Four times a day (QID) | RESPIRATORY_TRACT | Status: DC | PRN
  Filled 2021-05-04: qty 6.7

## 2021-05-04 MED ORDER — SODIUM ZIRCONIUM CYCLOSILICATE 10 G PO PACK
10.0000 g | PACK | Freq: Once | ORAL | Status: AC
  Administered 2021-05-04: 10 g via ORAL
  Filled 2021-05-04: qty 1

## 2021-05-04 MED ORDER — ASPIRIN EC 81 MG PO TBEC
81.0000 mg | DELAYED_RELEASE_TABLET | Freq: Every day | ORAL | Status: AC
  Administered 2021-05-04 – 2021-05-11 (×8): 81 mg via ORAL
  Filled 2021-05-04 (×8): qty 1

## 2021-05-04 MED ORDER — SODIUM CHLORIDE 0.9 % IV SOLN: INTRAVENOUS | Status: DC

## 2021-05-04 MED ORDER — LEVETIRACETAM 500 MG PO TABS
500.0000 mg | ORAL_TABLET | Freq: Every day | ORAL | Status: DC
  Administered 2021-05-04 – 2021-05-10 (×7): 500 mg via ORAL
  Filled 2021-05-04 (×3): qty 1
  Filled 2021-05-04 (×4): qty 2

## 2021-05-04 MED ORDER — INSULIN ASPART 100 UNIT/ML IJ SOLN
0.0000 [IU] | Freq: Three times a day (TID) | INTRAMUSCULAR | Status: DC
Start: 2021-05-04 — End: 2021-05-10
  Administered 2021-05-04 (×2): 2 [IU] via SUBCUTANEOUS
  Administered 2021-05-05: 3 [IU] via SUBCUTANEOUS
  Administered 2021-05-05 – 2021-05-06 (×4): 2 [IU] via SUBCUTANEOUS
  Administered 2021-05-07 – 2021-05-08 (×2): 1 [IU] via SUBCUTANEOUS
  Administered 2021-05-08 (×2): 3 [IU] via SUBCUTANEOUS
  Administered 2021-05-09 (×2): 2 [IU] via SUBCUTANEOUS
  Administered 2021-05-09: 7 [IU] via SUBCUTANEOUS
  Administered 2021-05-10 (×2): 2 [IU] via SUBCUTANEOUS
  Administered 2021-05-10: 3 [IU] via SUBCUTANEOUS

## 2021-05-04 MED ORDER — HYDRALAZINE HCL 50 MG PO TABS
50.0000 mg | ORAL_TABLET | Freq: Two times a day (BID) | ORAL | Status: DC
  Administered 2021-05-04 – 2021-05-11 (×15): 50 mg via ORAL
  Filled 2021-05-04 (×15): qty 1

## 2021-05-04 MED ORDER — MUPIROCIN 2 % EX OINT: 1.0000 "application " | TOPICAL_OINTMENT | Freq: Two times a day (BID) | CUTANEOUS | Status: DC

## 2021-05-04 MED ORDER — CHLORHEXIDINE GLUCONATE CLOTH 2 % EX PADS
6.0000 | MEDICATED_PAD | Freq: Every day | CUTANEOUS | Status: DC
  Administered 2021-05-04 – 2021-05-06 (×3): 6 via TOPICAL

## 2021-05-04 MED ORDER — SODIUM BICARBONATE 8.4 % IV SOLN
INTRAVENOUS | Status: DC
  Filled 2021-05-04 (×3): qty 1000

## 2021-05-04 MED ORDER — ISOSORBIDE MONONITRATE ER 30 MG PO TB24
30.0000 mg | ORAL_TABLET | Freq: Every day | ORAL | Status: DC
  Administered 2021-05-04 – 2021-05-11 (×8): 30 mg via ORAL
  Filled 2021-05-04 (×8): qty 1

## 2021-05-04 MED ORDER — HEPARIN SODIUM (PORCINE) 5000 UNIT/ML IJ SOLN
5000.0000 [IU] | Freq: Three times a day (TID) | INTRAMUSCULAR | Status: AC
  Administered 2021-05-04 – 2021-05-06 (×9): 5000 [IU] via SUBCUTANEOUS
  Filled 2021-05-04 (×9): qty 1

## 2021-05-04 SURGICAL SUPPLY — 8 items
CABLE ADAPT PACING TEMP 12FT (ADAPTER) ×1 IMPLANT
CATH TEMPO TEMP PACE LEAD (CATHETERS) IMPLANT
CATHETER TEMPO TEMP PACE LEAD (CATHETERS) ×2
KIT MICROPUNCTURE NIT STIFF (SHEATH) ×1 IMPLANT
PACK CARDIAC CATHETERIZATION (CUSTOM PROCEDURE TRAY) ×1 IMPLANT
SHEATH PINNACLE 6F 10CM (SHEATH) ×1 IMPLANT
SHEATH PROBE COVER 6X72 (BAG) ×1 IMPLANT
SLEEVE REPOSITIONING LENGTH 30 (MISCELLANEOUS) ×1 IMPLANT

## 2021-05-04 NOTE — Consult Note (Signed)
NAME:  Kayla [name], MRN:  [id number], DOB:  01-08-1941, LOS: 0 ADMISSION DATE:  05/03/2021, CONSULTATION DATE:  05/04/21 REFERRING MD:  Kayla [name], CHIEF COMPLAINT:  CHB   History of Present Illness:  Kayla [name] is an 80 y/o woman with a history of CKD 3, diabetes, previous stroke, CAD, recently diagnosed second-degree heart block with 2-1 conduction that improved with discontinuation of beta-blocker during recent hospitalization 11/9-11/11 who presented yesterday after becoming lethargic and confused.  She had a normal day on 11/12.  Yesterday when family checked her heart rate at home it was in the 30s.  She returned to the ER in complete heart block with AKI and hyperkalemia.  Overnight PCCM was consulted and noted that cardiac conduction improved with treatment of hyperkalemia.  Unfortunately overnight she developed progressive acidosis and heart block and underwent emergent TVP placement and was transferred to the ICU.  PCCM is taking over her care.  Currently she denies complaints.  Pertinent  Medical History  CAD w/ DES in 2016 DM CKD III CVA 2nd degree HB Breast cancer Recent covid infection  Significant Hospital Events: Including procedures, antibiotic start and stop dates in addition to other pertinent events   TVP placement 05/04/2021  Interim History / Subjective:    Objective   Blood pressure (!) 162/89, pulse 83, resp. rate (!) 23, SpO2 98 %.        Intake/Output Summary (Last 24 hours) at 05/04/2021 1117 Last data filed at 05/04/2021 0930 Gross per 24 hour  Intake 500 ml  Output 450 ml  Net 50 ml   There were no vitals filed for this visit.  Examination: General: Elderly woman lying in bed no acute distress, sleeping but arousable HENT: [place]/AT, eyes anicteric Neck: RIJ introducer catheter Lungs: Breathing comfortably on , CTAB Cardiovascular: Paced rhythm, no murmurs Abdomen: Soft, nontender, nondistended Extremities: Mild ankle edema bilaterally, no  clubbing or cyanosis Neuro: Sleepy, but arousable.  Falls back asleep after saying short phrases. Derm: Warm, dry, no rashes  Renal ultrasound-right renal cyst, benign.  No hydronephrosis. CXR 11/13 personally reviewed-venous congestion, no opacities  Resolved Hospital Problem list     Assessment & Plan:   Cardiogenic shock, bradycardia due to CHB Had history of 2:1 AV block that had improved after Bblocker discontinuation, now progressed to CHB. TSH WNL -con't ICU care for TVP; tentatively planning for PPM -Appreciate cardiology's management  History of CAD - Con't statin. Plavix discontinued per cardiology recommendation. Aspirin 19m daily ordered. - Holding beta-blocker due to heart block -con't imdur  AGMA, suspect lactic acidosis AKI with hyperkalemia, likely due to shock and ACE inhibitor use -pacing to maintain perfusion -strict I/Os -renally dose meds, avoid nephrotoxic meds -Hold ACE and HCTZ -repeat BMP now -con't bicarb infusion for now; hopefully can discontinue soon  Acute metabolic encephalopathy due to poor perfusion, renal failure -Hold potentially sedating medications  Previous COVID infection, ongoing lingering cough - Trial of bronchodilators-Dulera twice daily, albuterol as needed - Mucinex twice daily -No need for precautions.  Her critical illness is not related to COVID and she is out of the 14-day window.  DM, uncontrolled, A1c 8.7 -Sliding scale insulin as needed - Goal BG 140-180 -Hold PTA glipizide and Jardiance  Hyperbilirubinemia, likely related to heart block - Monitor  HTN -hydralazine PRN -no AVN blocking meds  H/o CVA, seizures -con't keppra  Son updated at bedside.  Best Practice (right click and "Reselect all SmartList Selections" daily)   Diet/type: Regular consistency (see orders)  DVT prophylaxis: prophylactic heparin  GI prophylaxis: N/A Lines: Central line and yes and it is still needed Foley:  N/A Code Status:   full code Last date of multidisciplinary goals of care discussion _0   Labs   CBC: Recent Labs  Lab 04/29/21 1437 04/29/21 1542 05/03/21 2035 05/04/21 0353  WBC 5.0  --  6.8 6.0  NEUTROABS 3.3  --   --  3.4  HGB 15.0 16.0* 15.9* 14.7  HCT 47.2* 47.0* 50.5* 46.7*  MCV 91.5  --  93.0 91.6  PLT 226  --  253 532    Basic Metabolic Panel: Recent Labs  Lab 04/29/21 1437 04/29/21 1542 04/30/21 0149 05/01/21 0159 05/03/21 2035 05/04/21 0353  NA 139 144 140 135 139 139  K 4.6 4.4 4.4 4.0 5.5* 5.3*  CL 108 113* 111 105 109 111  CO2 20*  --  19* 20* 17* 14*  GLUCOSE 142* 138* 101* 155* 152* 138*  BUN 49* 48* 42* 37* 55* 55*  CREATININE 1.98* 2.10* 1.64* 1.56* 3.72* 3.51*  CALCIUM 8.7*  --  8.7* 8.5* 9.1 8.7*  MG 2.6*  --   --   --  2.4 2.5*   GFR: Estimated Creatinine Clearance: 13.2 mL/min (A) (by C-G formula based on SCr of 3.51 mg/dL (H)). Recent Labs  Lab 04/29/21 1437 05/03/21 2035 05/04/21 0353  WBC 5.0 6.8 6.0    Liver Function Tests: Recent Labs  Lab 04/29/21 1437 05/04/21 0353  AST 14* 30  ALT 11 18  ALKPHOS 86 59  BILITOT 0.9 1.4*  PROT 7.2 6.4*  ALBUMIN 3.2* 3.0*   No results for input(s): LIPASE, AMYLASE in the last 168 hours. No results for input(s): AMMONIA in the last 168 hours.  ABG    Component Value Date/Time   PHART 7.256 (L) 06/20/2016 0026   PCO2ART 40.1 06/20/2016 0026   PO2ART 108.0 06/20/2016 0026   HCO3 17.9 (L) 06/20/2016 0026   TCO2 22 04/29/2021 1542   ACIDBASEDEF 9.0 (H) 06/20/2016 0026   O2SAT 97.0 06/20/2016 0026     Coagulation Profile: No results for input(s): INR, PROTIME in the last 168 hours.  Cardiac Enzymes: No results for input(s): CKTOTAL, CKMB, CKMBINDEX, TROPONINI in the last 168 hours.  HbA1C: Hemoglobin A1C  Date/Time Value Ref Range Status  04/13/2019 02:18 PM 7.5 (A) 4.0 - 5.6 % Final   Hgb A1c MFr Bld  Date/Time Value Ref Range Status  04/30/2021 01:49 AM 8.7 (H) 4.8 - 5.6 % Final    Comment:     (NOTE) Pre diabetes:          5.7%-6.4%  Diabetes:              >6.4%  Glycemic control for   <7.0% adults with diabetes   12/29/2018 12:07 PM 7.7 (H) 4.6 - 6.5 % Final    Comment:    Glycemic Control Guidelines for People with Diabetes:Non Diabetic:  <6%Goal of Therapy: <7%Additional Action Suggested:  >8%     CBG: Recent Labs  Lab 04/30/21 1112 04/30/21 1819 04/30/21 2127 05/01/21 0617 05/03/21 2201  GLUCAP 199* 163* 222* 146* 142*    Review of Systems:   Limited due to encephalopathy.  Past Medical History:  She,  has a past medical history of Allergy, Anxiety, Arthritis, Asthmatic bronchitis, Blind left eye, CAD (coronary artery disease), Constipation, Depression, Diverticulosis of colon, DJD (degenerative joint disease), Fibromyalgia, GERD (gastroesophageal reflux disease), Heart murmur, History of gout, Hypercholesteremia, Hypertension, Personal history of noncompliance  with medical treatment, presenting hazards to health, Prolapse of vaginal vault after hysterectomy, Proteinuria, Sickle-cell trait ([place]), Somatic dysfunction, Stroke ([place]) (~ 08/2014), Type II diabetes mellitus ([place]), and Venous insufficiency.   Surgical History:   Past Surgical History:  Procedure Laterality Date   ABDOMINAL HYSTERECTOMY     CARDIAC CATHETERIZATION N/A 01/14/2015   Procedure: Left Heart Cath and Coronary Angiography;  Surgeon: Charolette Forward, MD;  Location: [facility];  Service: Cardiovascular;  Laterality: N/A;   CARDIAC CATHETERIZATION  ~ 2011   CATARACT EXTRACTION W/ INTRAOCULAR LENS  IMPLANT, BILATERAL Bilateral 2012   CORONARY ANGIOPLASTY     DILATION AND CURETTAGE OF UTERUS  "probably"   LOOP RECORDER IMPLANT N/A 09/03/2014   Procedure: LOOP RECORDER IMPLANT;  Surgeon: Thompson Grayer, MD;  Location[facility];  Service: Cardiovascular;  Laterality: N/A;   ROBOTIC ASSISTED LAPAROSCOPIC SACROCOLPOPEXY  09/2009   Dr. Matilde Sprang   TEE WITHOUT CARDIOVERSION N/A 09/02/2014    Procedure: TRANSESOPHAGEAL ECHOCARDIOGRAM (TEE);  Surgeon: Lelon Perla, MD;  Location: [facility] ENDOSCOPY;  Service: Cardiovascular;  Laterality: N/A;     Social History:   reports that she has never smoked. She has never used smokeless tobacco. She reports current alcohol use. She reports that she does not use drugs.   Family History:  Her family history includes Bell's palsy in her son; Hypertension in her father and sister; Prostate cancer in her father; Seizures in her sister. There is no history of Heart attack or Stroke.   Allergies Allergies  Allergen Reactions   Amlodipine Shortness Of Breath and Rash   Fish Allergy Hives   Penicillins Other (See Comments)    REACTION: red rash   Shellfish Allergy Hives   Peanuts [Peanut Oil] Rash   Tomato Rash     Home Medications  Prior to Admission medications   Medication Sig Start Date End Date Taking? Authorizing Provider  acetaminophen (TYLENOL) 500 MG tablet Take 1,000 mg by mouth every 6 (six) hours as needed for moderate pain or headache.   Yes [provider]  atorvastatin (LIPITOR) 20 MG tablet Take 1 tablet (20 mg total) by mouth daily at 6 PM. Patient taking differently: Take 20 mg by mouth at bedtime. 02/19/15  Yes Hoyt Koch, MD  cetirizine (ZYRTEC) 10 MG tablet Take 10 mg by mouth at bedtime.   Yes [provider]  clopidogrel (PLAVIX) 75 MG tablet Take 1 tablet (75 mg total) by mouth daily. OFFICE VISIT IS DUE 07/01/20  Yes Hoyt Koch, MD  Cyanocobalamin (VITAMIN B-12) 2500 MCG SUBL Place 2,500 mcg under the tongue daily.   Yes [provider]  dextromethorphan-guaiFENesin (MUCINEX DM) 30-600 MG 12hr tablet Take 1 tablet by mouth 2 (two) times daily as needed for cough.   Yes [provider]  glucose blood test strip Use to check blood sugars twice daily Patient taking differently: Check blood sugar every morning 01/26/19  Yes Hoyt Koch, MD  glycopyrrolate  (ROBINUL) 1 MG tablet Take 1 mg by mouth at bedtime. 03/27/21  Yes [provider]  hydrALAZINE (APRESOLINE) 50 MG tablet Take 1 tablet (50 mg total) by mouth 2 (two) times daily. Annual appt due in July must see provider for future refills 11/20/19  Yes Hoyt Koch, MD  hydrochlorothiazide (HYDRODIURIL) 12.5 MG tablet Take 12.5 mg by mouth daily. 03/30/21  Yes [provider]  isosorbide mononitrate (IMDUR) 30 MG 24 hr tablet Take 30 mg by mouth daily. 02/13/21  Yes [provider]  JARDIANCE 10 MG TABS tablet TAKE 1 TABLET BY MOUTH EVERY DAY Patient taking differently: Take 10 mg by mouth daily. 02/23/19  Yes Hoyt Koch, MD  levETIRAcetam (KEPPRA) 500 MG tablet Take 500 mg by mouth at bedtime. 02/25/21  Yes [provider]  lisinopril (ZESTRIL) 20 MG tablet Take 1 tablet (20 mg total) by mouth daily. Needs office visit 06/23/20  Yes Hoyt Koch, MD  Metamucil Fiber CHEW Chew 1 tablet by mouth daily.   Yes [provider]  nitroGLYCERIN (NITROSTAT) 0.4 MG SL tablet Place 0.4 mg under the tongue every 5 (five) minutes as needed for chest pain.   Yes [provider]  ONE TOUCH ULTRA TEST test strip USE TO TEST TWICE DAILY (BEFORE BREAKFAST, BEDTIME) E11.4 Patient taking differently: Check blood sugar every morning 01/03/18  Yes Hoyt Koch, MD  polyethylene glycol (MIRALAX / GLYCOLAX) packet Take 17 g by mouth daily as needed for moderate constipation.   Yes [provider]  vitamin E 180 MG (400 UNITS) capsule Take 400 Units by mouth daily.   Yes [provider]  blood glucose meter kit and supplies KIT Dispense what is covered by patients insurance with strips and lancets. Use twice a day (before breakfast and at bedtime). E11.40 01/26/19   Hoyt Koch, MD  glipiZIDE (GLUCOTROL XL) 2.5 MG 24 hr tablet Take 1 tablet (2.5 mg total) by mouth 2 (two) times daily. VISIT IS OVERDUE!!!!! Patient  not taking: No sig reported 07/31/20   Hoyt Koch, MD  OneTouch Delica Lancets 77O MISC USE TO TEST BLOOD SUGAR TWICE DAILY Patient taking differently: Check blood sugar every morning 07/11/19   Hoyt Koch, MD  [facility] ULTRA test strip USE TO CHECK BLOOD SUGAR TWICE DAILY Patient not taking: Reported on 04/29/2021 12/12/20   Hoyt Koch, MD     Critical care time: 47 min.     Julian Hy, DO 05/04/21 11:58 AM [facility]

## 2021-05-04 NOTE — ED Provider Notes (Signed)
Called to see the patient because of extreme bradycardia.  On arrival, she is noted to be in complete heart block with heart rate in the upper 20s.  She is maintaining an adequate blood pressure with this and is alert and talking.  She is placed on a external pacemaker.  I have contacted Dr. Bonner Puna, who is her attending physician, who will be contacting cardiology to assess patient for possible temporary or permanent pacemaker insertion.   Delora Fuel, MD 96/29/52 [phone]

## 2021-05-04 NOTE — H&P (Signed)
History and Physical    Kayla [name] GGY:[id number] DOB: 1940/12/23 DOA: 05/03/2021  PCP: Cipriano Mile, NP  Patient coming from: Home.  Chief Complaint: Heart rate.  HPI: Kayla [name] is a 80 y.o. female with history of CAD status post stenting, diabetes mellitus type 2, chronic kidney disease stage III, history of CVA, history of seizures was recently admitted for bradycardia with second-degree AV block improved with stopping esmolol was discharged home about 2 days ago comes back to the ER after patient has been having low heart rate found at home.  Also patient has not been having good oral intake for the last few days since he was diagnosed with COVID recently.  Denies any chest pain nausea vomiting abdominal pain diarrhea or shortness of breath.  ED Course: In the ER patient was found to be in complete heart block with labs showing creatinine has worsened from 1.52 days ago it is around 3.7 now with potassium of 5.5 bicarb of 17.  Patient was given Lokelma calcium gluconate D50 and IV insulin following which patient's heart rate improved and normalized.  On-call cardiologist and pulmonary critical care were consulted.  Patient admitted for further work-up of bradycardia with complete heart block in the setting of acute renal failure with hyperkalemia.  Review of Systems: As per HPI, rest all negative.   Past Medical History:  Diagnosis Date   Allergy    Shell fish   Anxiety    Arthritis    "knees, back, probably left hand" (01/14/2015)   Asthmatic bronchitis    Blind left eye    CAD (coronary artery disease)    cath in 2016 showing 40% ostial RCA, 40% ostial D1, 90% proximal D1, 85% and 70% sequential distal LAD stenosis and underwent PCI with stents in the D1 and LAD.   Constipation    Depression    Diverticulosis of colon    DJD (degenerative joint disease)    Fibromyalgia    GERD (gastroesophageal reflux disease)    Heart murmur    History of gout    Hypercholesteremia     Hypertension    Personal history of noncompliance with medical treatment, presenting hazards to health    Prolapse of vaginal vault after hysterectomy    Proteinuria    Sickle-cell trait ([place])    Somatic dysfunction    Stroke ([place]) ~ 08/2014   "blind in left eye; weak on left side since" (01/14/2015)   Type II diabetes mellitus ([place])    Venous insufficiency     Past Surgical History:  Procedure Laterality Date   ABDOMINAL HYSTERECTOMY     CARDIAC CATHETERIZATION N/A 01/14/2015   Procedure: Left Heart Cath and Coronary Angiography;  Surgeon: Charolette Forward, MD;  Location: [facility];  Service: Cardiovascular;  Laterality: N/A;   CARDIAC CATHETERIZATION  ~ 2011   CATARACT EXTRACTION W/ INTRAOCULAR LENS  IMPLANT, BILATERAL Bilateral 2012   CORONARY ANGIOPLASTY     DILATION AND CURETTAGE OF UTERUS  "probably"   LOOP RECORDER IMPLANT N/A 09/03/2014   Procedure: LOOP RECORDER IMPLANT;  Surgeon: Thompson Grayer, MD;  Location: [facility];  Service: Cardiovascular;  Laterality: N/A;   ROBOTIC ASSISTED LAPAROSCOPIC SACROCOLPOPEXY  09/2009   Dr. Matilde Sprang   TEE WITHOUT CARDIOVERSION N/A 09/02/2014   Procedure: TRANSESOPHAGEAL ECHOCARDIOGRAM (TEE);  Surgeon: Lelon Perla, MD;  Location: [facility];  Service: Cardiovascular;  Laterality: N/A;     reports that she has never smoked. She has never used smokeless tobacco.  She reports current alcohol use. She reports that she does not use drugs.  Allergies  Allergen Reactions   Amlodipine Shortness Of Breath and Rash   Fish Allergy Hives   Penicillins Other (See Comments)    REACTION: red rash   Shellfish Allergy Hives   Peanuts [Peanut Oil] Rash   Tomato Rash    Family History  Problem Relation Age of Onset   Prostate cancer Father    Hypertension Father    Seizures Sister    Hypertension Sister    Bell's palsy Son    Heart attack Neg Hx    Stroke Neg Hx     Prior to Admission medications   Medication Sig Start Date  End Date Taking? Authorizing Provider  acetaminophen (TYLENOL) 500 MG tablet Take 1,000 mg by mouth every 6 (six) hours as needed for moderate pain or headache.   Yes [provider]  atorvastatin (LIPITOR) 20 MG tablet Take 1 tablet (20 mg total) by mouth daily at 6 PM. Patient taking differently: Take 20 mg by mouth at bedtime. 02/19/15  Yes Hoyt Koch, MD  cetirizine (ZYRTEC) 10 MG tablet Take 10 mg by mouth at bedtime.   Yes [provider]  clopidogrel (PLAVIX) 75 MG tablet Take 1 tablet (75 mg total) by mouth daily. OFFICE VISIT IS DUE 07/01/20  Yes Hoyt Koch, MD  Cyanocobalamin (VITAMIN B-12) 2500 MCG SUBL Place 2,500 mcg under the tongue daily.   Yes [provider]  dextromethorphan-guaiFENesin (MUCINEX DM) 30-600 MG 12hr tablet Take 1 tablet by mouth 2 (two) times daily as needed for cough.   Yes [provider]  glucose blood test strip Use to check blood sugars twice daily Patient taking differently: Check blood sugar every morning 01/26/19  Yes Hoyt Koch, MD  glycopyrrolate (ROBINUL) 1 MG tablet Take 1 mg by mouth at bedtime. 03/27/21  Yes [provider]  hydrALAZINE (APRESOLINE) 50 MG tablet Take 1 tablet (50 mg total) by mouth 2 (two) times daily. Annual appt due in July must see provider for future refills 11/20/19  Yes Hoyt Koch, MD  hydrochlorothiazide (HYDRODIURIL) 12.5 MG tablet Take 12.5 mg by mouth daily. 03/30/21  Yes [provider]  isosorbide mononitrate (IMDUR) 30 MG 24 hr tablet Take 30 mg by mouth daily. 02/13/21  Yes [provider]  JARDIANCE 10 MG TABS tablet TAKE 1 TABLET BY MOUTH EVERY DAY Patient taking differently: Take 10 mg by mouth daily. 02/23/19  Yes Hoyt Koch, MD  levETIRAcetam (KEPPRA) 500 MG tablet Take 500 mg by mouth at bedtime. 02/25/21  Yes [provider]  lisinopril (ZESTRIL) 20 MG tablet Take 1 tablet (20 mg total) by mouth daily.  Needs office visit 06/23/20  Yes Hoyt Koch, MD  Metamucil Fiber CHEW Chew 1 tablet by mouth daily.   Yes [provider]  nitroGLYCERIN (NITROSTAT) 0.4 MG SL tablet Place 0.4 mg under the tongue every 5 (five) minutes as needed for chest pain.   Yes [provider]  ONE TOUCH ULTRA TEST test strip USE TO TEST TWICE DAILY (BEFORE BREAKFAST, BEDTIME) E11.4 Patient taking differently: Check blood sugar every morning 01/03/18  Yes Hoyt Koch, MD  polyethylene glycol (MIRALAX / GLYCOLAX) packet Take 17 g by mouth daily as needed for moderate constipation.   Yes [provider]  vitamin E 180 MG (400 UNITS) capsule Take 400 Units by mouth daily.   Yes [provider]  blood glucose  meter kit and supplies KIT Dispense what is covered by patients insurance with strips and lancets. Use twice a day (before breakfast and at bedtime). E11.40 01/26/19   Hoyt Koch, MD  glipiZIDE (GLUCOTROL XL) 2.5 MG 24 hr tablet Take 1 tablet (2.5 mg total) by mouth 2 (two) times daily. VISIT IS OVERDUE!!!!! Patient not taking: No sig reported 07/31/20   Hoyt Koch, MD  OneTouch Delica Lancets 69I MISC USE TO TEST BLOOD SUGAR TWICE DAILY Patient taking differently: Check blood sugar every morning 07/11/19   Hoyt Koch, MD  [facility] ULTRA test strip USE TO CHECK BLOOD SUGAR TWICE DAILY Patient not taking: Reported on 04/29/2021 12/12/20   Hoyt Koch, MD    Physical Exam: Constitutional: Moderately built and nourished. Vitals:   05/03/21 2135 05/03/21 2230 05/03/21 2300 05/03/21 2310  BP: (!) 136/58 (!) 136/38 (!) 141/74 109/76  Pulse:  (!) 40    Resp:  19 (!) 27 (!) 24  SpO2:  100% 100%    Eyes: Anicteric no pallor. ENMT: No discharge from the ears eyes nose and mouth. Neck: No mass felt.  No neck rigidity. Respiratory: No rhonchi or crepitations. Cardiovascular: S1-S2 heard. Abdomen: Soft nontender bowel sound  present. Musculoskeletal: No edema. Skin: No rash. Neurologic: Alert awake oriented to place and person.  Moves all extremities. Psychiatric: Oriented to name and place.   Labs on Admission: I have personally reviewed following labs and imaging studies  CBC: Recent Labs  Lab 04/29/21 1437 04/29/21 1542 05/03/21 2035  WBC 5.0  --  6.8  NEUTROABS 3.3  --   --   HGB 15.0 16.0* 15.9*  HCT 47.2* 47.0* 50.5*  MCV 91.5  --  93.0  PLT 226  --  503   Basic Metabolic Panel: Recent Labs  Lab 04/29/21 1437 04/29/21 1542 04/30/21 0149 05/01/21 0159 05/03/21 2035  NA 139 144 140 135 139  K 4.6 4.4 4.4 4.0 5.5*  CL 108 113* 111 105 109  CO2 20*  --  19* 20* 17*  GLUCOSE 142* 138* 101* 155* 152*  BUN 49* 48* 42* 37* 55*  CREATININE 1.98* 2.10* 1.64* 1.56* 3.72*  CALCIUM 8.7*  --  8.7* 8.5* 9.1  MG 2.6*  --   --   --  2.4   GFR: Estimated Creatinine Clearance: 12.5 mL/min (A) (by C-G formula based on SCr of 3.72 mg/dL (H)). Liver Function Tests: Recent Labs  Lab 04/29/21 1437  AST 14*  ALT 11  ALKPHOS 86  BILITOT 0.9  PROT 7.2  ALBUMIN 3.2*   No results for input(s): LIPASE, AMYLASE in the last 168 hours. No results for input(s): AMMONIA in the last 168 hours. Coagulation Profile: No results for input(s): INR, PROTIME in the last 168 hours. Cardiac Enzymes: No results for input(s): CKTOTAL, CKMB, CKMBINDEX, TROPONINI in the last 168 hours. BNP (last 3 results) No results for input(s): PROBNP in the last 8760 hours. HbA1C: No results for input(s): HGBA1C in the last 72 hours. CBG: Recent Labs  Lab 04/30/21 1112 04/30/21 1819 04/30/21 2127 05/01/21 0617 05/03/21 2201  GLUCAP 199* 163* 222* 146* 142*   Lipid Profile: No results for input(s): CHOL, HDL, LDLCALC, TRIG, CHOLHDL, LDLDIRECT in the last 72 hours. Thyroid Function Tests: No results for input(s): TSH, T4TOTAL, FREET4, T3FREE, THYROIDAB in the last 72 hours. Anemia Panel: No results for input(s):  VITAMINB12, FOLATE, FERRITIN, TIBC, IRON, RETICCTPCT in the last 72 hours. Urine analysis:    Component Value  Date/Time   COLORURINE YELLOW 04/29/2021 1946   APPEARANCEUR HAZY (A) 04/29/2021 1946   LABSPEC 1.013 04/29/2021 1946   PHURINE 5.0 04/29/2021 1946   GLUCOSEU >=500 (A) 04/29/2021 1946   GLUCOSEU NEGATIVE 02/25/2016 [place] 04/29/2021 1946   BILIRUBINUR NEGATIVE 04/29/2021 1946   BILIRUBINUR neg 02/03/2017 1132   KETONESUR 5 (A) 04/29/2021 1946   PROTEINUR NEGATIVE 04/29/2021 1946   UROBILINOGEN 0.2 02/03/2017 1132   UROBILINOGEN 0.2 02/25/2016 0949   NITRITE NEGATIVE 04/29/2021 1946   LEUKOCYTESUR SMALL (A) 04/29/2021 1946   Sepsis Labs: $RemoveBefo'@[id number]'[id number]$ (procalcitonin:4,lacticidven:4) ) Recent Results (from the past 240 hour(s))  Resp Panel by RT-PCR (Flu A&B, Covid) Nasopharyngeal Swab     Status: Abnormal   Collection Time: 04/29/21  3:16 PM   Specimen: Nasopharyngeal Swab; Nasopharyngeal(NP) swabs in vial transport medium  Result Value Ref Range Status   SARS Coronavirus 2 by RT PCR POSITIVE (A) NEGATIVE Final    Comment: RESULT CALLED TO, READ BACK BY AND VERIFIED WITH: CHRIS CRISCO RN 04/29/2021 $RemoveBefo'@[id number]'[id number]$  BY JW (NOTE) SARS-CoV-2 target nucleic acids are DETECTED.  The SARS-CoV-2 RNA is generally detectable in upper respiratory specimens during the acute phase of infection. Positive results are indicative of the presence of the identified virus, but do not rule out bacterial infection or co-infection with other pathogens not detected by the test. Clinical correlation with patient history and other diagnostic information is necessary to determine patient infection status. The expected result is Negative.  Fact Sheet for Patients: EntrepreneurPulse.com.au  Fact Sheet for Healthcare Providers: IncredibleEmployment.be  This test is not yet approved or cleared by the [place] FDA and  has been authorized for detection  and/or diagnosis of SARS-CoV-2 by FDA under an Emergency Use Authorization (EUA).  This EUA will remain in effect (meaning this test ca n be used) for the duration of  the COVID-19 declaration under Section 564(b)(1) of the Act, 21 U.S.C. section 360bbb-3(b)(1), unless the authorization is terminated or revoked sooner.     Influenza A by PCR NEGATIVE NEGATIVE Final   Influenza B by PCR NEGATIVE NEGATIVE Final    Comment: (NOTE) The Xpert Xpress SARS-CoV-2/FLU/RSV plus assay is intended as an aid in the diagnosis of influenza from Nasopharyngeal swab specimens and should not be used as a sole basis for treatment. Nasal washings and aspirates are unacceptable for Xpert Xpress SARS-CoV-2/FLU/RSV testing.  Fact Sheet for Patients: EntrepreneurPulse.com.au  Fact Sheet for Healthcare Providers: IncredibleEmployment.be  This test is not yet approved or cleared by the [place] FDA and has been authorized for detection and/or diagnosis of SARS-CoV-2 by FDA under an Emergency Use Authorization (EUA). This EUA will remain in effect (meaning this test can be used) for the duration of the COVID-19 declaration under Section 564(b)(1) of the Act, 21 U.S.C. section 360bbb-3(b)(1), unless the authorization is terminated or revoked.  Performed at [place] [facility], [place] [address], [place], [place] [address]   Resp Panel by RT-PCR (Flu A&B, Covid) Nasopharyngeal Swab     Status: Abnormal   Collection Time: 05/03/21 10:23 PM   Specimen: Nasopharyngeal Swab; Nasopharyngeal(NP) swabs in vial transport medium  Result Value Ref Range Status   SARS Coronavirus 2 by RT PCR POSITIVE (A) NEGATIVE Final    Comment: RESULT CALLED TO, READ BACK BY AND VERIFIED WITH: C CRISCOE,RN$RemoveBeforeDEI'@0005'ogsHcFdOFLqoGcTO$  05/04/21 [place] (NOTE) SARS-CoV-2 target nucleic acids are DETECTED.  The SARS-CoV-2 RNA is generally detectable in upper respiratory specimens during the acute phase of infection.  Positive results  are indicative of the presence of the identified virus, but do not rule out bacterial infection or co-infection with other pathogens not detected by the test. Clinical correlation with patient history and other diagnostic information is necessary to determine patient infection status. The expected result is Negative.  Fact Sheet for Patients: EntrepreneurPulse.com.au  Fact Sheet for Healthcare Providers: IncredibleEmployment.be  This test is not yet approved or cleared by the [place] FDA and  has been authorized for detection and/or diagnosis of SARS-CoV-2 by FDA under an Emergency Use Authorization (EUA).  This EUA will remain in effect (meaning this test can be use d) for the duration of  the COVID-19 declaration under Section 564(b)(1) of the Act, 21 U.S.C. section 360bbb-3(b)(1), unless the authorization is terminated or revoked sooner.     Influenza A by PCR NEGATIVE NEGATIVE Final   Influenza B by PCR NEGATIVE NEGATIVE Final    Comment: (NOTE) The Xpert Xpress SARS-CoV-2/FLU/RSV plus assay is intended as an aid in the diagnosis of influenza from Nasopharyngeal swab specimens and should not be used as a sole basis for treatment. Nasal washings and aspirates are unacceptable for Xpert Xpress SARS-CoV-2/FLU/RSV testing.  Fact Sheet for Patients: EntrepreneurPulse.com.au  Fact Sheet for Healthcare Providers: IncredibleEmployment.be  This test is not yet approved or cleared by the [place] FDA and has been authorized for detection and/or diagnosis of SARS-CoV-2 by FDA under an Emergency Use Authorization (EUA). This EUA will remain in effect (meaning this test can be used) for the duration of the COVID-19 declaration under Section 564(b)(1) of the Act, 21 U.S.C. section 360bbb-3(b)(1), unless the authorization is terminated or revoked.  Performed at [facility],  [place] [address] [place], [place] [address]      Radiological Exams on Admission: DG Chest Portable 1 View  Result Date: 05/03/2021 CLINICAL DATA:  Bradycardia EXAM: PORTABLE CHEST 1 VIEW COMPARISON:  04/29/2021 FINDINGS: Lungs are symmetrically expanded and are clear. No pneumothorax or pleural effusion. Cardiac size within normal limits. Pulmonary vascularity is normal. Osseous structures are age-appropriate. No acute bone abnormality. IMPRESSION: No active disease. Electronically Signed   By: Fidela Salisbury M.D.   On: 05/03/2021 20:30    EKG: Independently reviewed.  Initial EKG showing complete heart block with a heart rate in the 29's.  Assessment/Plan Principal Problem:   Bradycardia Active Problems:   Essential hypertension   Type 2 diabetes mellitus with diabetic neuropathy (HCC)   Seizure as late effect of cerebrovascular accident (CVA) ([place])   Vascular dementia ([place])   COVID-19 virus infection   Complete heart block ([place])   ARF (acute renal failure) (HCC)    Complete heart block improved after temporizing measures for hyperkalemia.  Cardiology has been consulted.  Patient is not on any AV nodal blocking drugs.  We will continue to monitor closely.  Further recommendations per cardiology. Elevated troponin denies any chest pain.  Per cardiology elevated troponin is expected in the setting of complete heart block.   Acute on chronic kidney disease stage III with hyperkalemia we will hold off patient's lisinopril hydrochlorothiazide.  Patient did receive temporizing measures for the hyperkalemia with calcium gluconate Lokelma D50 insulin.  We will gently hydrate follow metabolic panel closely. Diabetes mellitus type 2 poorly controlled with recent hemoglobin A1c of 8.7 about 4 days ago.  Closely follow CBGs with sliding scale coverage. Recent COVID infection.  About 2 weeks ago.  Presently asymptomatic. History of CAD status post stenting has elevated troponin.  Denies any chest  pain.   On Plavix statins.  Not on any beta-blockers due to complete heart block.  Takes Imdur. Hypertension holding lisinopril and hydrochlorothiazide due to acute renal failure hyperkalemia.  Continue Imdur and hydralazine.  Follow blood pressure trends. History of CVA and seizures presently on Keppra and Plavix and statins.  Patient recently had a COVID infection so COVID test was not repeated.   DVT prophylaxis: Lovenox. Code Status: Full code. Family Communication: Patient's son at the bedside. Disposition Plan: Home. Consults called: Cardiology and pulmonary critical care. Admission status: Observation.   Rise Patience MD [facility] Pager [phone].  If 7PM-7AM, please contact night-coverage [email or web address] Password [email or web address]  05/04/2021, 2:35 AM

## 2021-05-04 NOTE — Progress Notes (Signed)
PROGRESS NOTE  Brief Narrative: Kayla [name] is an 80 y.o. female with a history of CAD s/p stenting D1, and LAD July 2016, T2DM (HbA1c 8.7%), HTN, HLD, CVA complicated by seizure disorder, breast CA, sickle cell trait, and covid-19 infection diagnosed 04/19/2021 who was recently admitted 11/9 - 11/11 for 2:1 2nd degree AVB in the setting of poor oral intake, fatigue, weakness. Atenolol was held with improvement in HR. Therefore, cardiology did not recommend pacemaker insertion at that time, and she was discharged with home health services at her functional baseline having shown improved oral intake. At home, she did not continue adequate oral intake, and grew more lethargic. Her son found her HR to be in 59's on 11/13, called EMS who recorded CHB on ECG and brought her to the ED where she was found to have hyperkalemia, metabolic acidosis, and acute renal failure with creatinine up to 3.72 from 1.56 at recent discharge.   Subjective: Pt resting, not verbal  Objective: BP 122/75   Pulse 87   Resp 17   SpO2 98%   Gen: Elderly in no acute distress Pulm: Clear and nonlabored on room air, 100%. Occasional cough.  CV: Paced rhythm, no murmur, distant, trace LE edema. GI: Soft, NT, ND, +BS  Neuro: No tremor. Skin: No rashes, lesions or ulcers on visualized skin  Assessment & Plan: Complete heart block: Requiring external pacing, will be taken for transvenous pacing this morning based on my discussion with cardiology/EP. TSH remains wnl.   Acute renal failure with hyperkalemia and NAGMA on stage IIIb CKD:  - Changed to bicarbonate gtt.  - Repeat lokelma - Corrected calcium remains wnl at 9.5 (alb 3). - Insert foley, check urinalysis, lytes, etc.  - Will ask for nephrology consultation.  Covid-19 infection: Diagnosed 04/19/2021 without evidence of pneumonia, still negative CXR this admission. Well outside window for isolation or treatment. - Symptomatic Tx w/mucinex.    CAD s/p stents 2016  with demand myocardial ischemia. LBBB on ECG, troponin up to 553 last night, down to 243 this morning. Recent echo with no wall motion abnormalities, preserved LVEF.  - Continue plavix, statin, hold all AV nodal blocking agents as above.   T2DM: Poorly controlled with HbA1c 8.7%. Glucosuria and ketonuria noted though no elevation in anion gap and glucose not elevated terribly.  - Follow up with PCP for further management.   HTN:  - Defer to cardiology, but may hold imdur if concern for hypotension.   HLD:  - Continue statin   History of CVA, ensuing seizure disorder:  - Continue antiplatelet, statin, RF modification as above, and AED.    Moderate protein-calorie malnutrition:  - Supplement protein as able  Goals of care discussion: I spoke with her son at the bedside, Kayla [name], who I have met before. We discussed her clinical instability and he has confirmed the patient's desire to be full code and pursue all aggressive interventions presented to them.   Patrecia Pour, MD Pager on amion 05/04/2021, 7:34 AM

## 2021-05-04 NOTE — Interval H&P Note (Signed)
History and Physical Interval Note:  05/04/2021 7:43 AM  Kayla [name]  has presented today for surgery, with the diagnosis of heart block.  The various methods of treatment have been discussed with the patient and family. After consideration of risks, benefits and other options for treatment, the patient has consented to  Procedure(s): TEMPORARY PACEMAKER (N/A) as a surgical intervention.  The patient's history has been reviewed, patient examined, no change in status, stable for surgery.  I have reviewed the patient's chart and labs.  Questions were answered to the patient's satisfaction.    Pt known to me. Presented last week with 2:1 AV block with improved conduction once beta blocker washed out. Now with complete heart block. Plan temporary pacemaker placement emergently.   Sherren Mocha

## 2021-05-04 NOTE — Consult Note (Addendum)
Reason for Consult:AKI on CKD Stage 3b Referring Physician: Dr. Kimber Relic is an 80 y.o. female.  HPI:  Kayla [name] is a 80 y.o. female who presented to the ED on 11/13 for bradycardia. Of note, she was recently admitted 11/9-11/11 and diagnosed with 2:1 2nd degree AVB with rate in 30's and LBBB. Atenolol was held at the time of discharge. She returned a couple days later for HR in the 30's. 12-lead EKG by EMS demonstrated complete heart block. Labs also notable for AKI with creatinine of 3.7 from 1.56 just two days prior and elevated potassium of 5.5 and bicarb of 17. She received Lokelma and calcium gluconate D50 and IV insulin which improved heart rate.    Patient is accompanied by her son in the room this morning. He reports that she has been mildly confused lately but not hallucinating. He feels that her appetite has not recovered since her COVID infection and she was not eating or drinking well since her last discharge. She does not have a Nephrologist that she sees outpatient. Patient reports that she feels well. She denies any chest pain, shortness of breath, abdominal pain, nausea, vomiting, edema, visual changes. She feels fatigued and also agrees to not hydrating and eating well.   PMH:   Past Medical History:  Diagnosis Date   Allergy    Shell fish   Anxiety    Arthritis    "knees, back, probably left hand" (01/14/2015)   Asthmatic bronchitis    Blind left eye    CAD (coronary artery disease)    cath in 2016 showing 40% ostial RCA, 40% ostial D1, 90% proximal D1, 85% and 70% sequential distal LAD stenosis and underwent PCI with stents in the D1 and LAD.   Constipation    Depression    Diverticulosis of colon    DJD (degenerative joint disease)    Fibromyalgia    GERD (gastroesophageal reflux disease)    Heart murmur    History of gout    Hypercholesteremia    Hypertension    Personal history of noncompliance with medical treatment, presenting hazards to health     Prolapse of vaginal vault after hysterectomy    Proteinuria    Sickle-cell trait ([place])    Somatic dysfunction    Stroke ([place]) ~ 08/2014   "blind in left eye; weak on left side since" (01/14/2015)   Type II diabetes mellitus (HCC)    Venous insufficiency     PSH:   Past Surgical History:  Procedure Laterality Date   ABDOMINAL HYSTERECTOMY     CARDIAC CATHETERIZATION N/A 01/14/2015   Procedure: Left Heart Cath and Coronary Angiography;  Surgeon: Charolette Forward, MD;  Location: [facility];  Service: Cardiovascular;  Laterality: N/A;   CARDIAC CATHETERIZATION  ~ 2011   CATARACT EXTRACTION W/ INTRAOCULAR LENS  IMPLANT, BILATERAL Bilateral 2012   CORONARY ANGIOPLASTY     DILATION AND CURETTAGE OF UTERUS  "probably"   LOOP RECORDER IMPLANT N/A 09/03/2014   Procedure: LOOP RECORDER IMPLANT;  Surgeon: Thompson Grayer, MD;  Location: [facility] CATH LAB;  Service: Cardiovascular;  Laterality: N/A;   ROBOTIC ASSISTED LAPAROSCOPIC SACROCOLPOPEXY  09/2009   Dr. Matilde Sprang   TEE WITHOUT CARDIOVERSION N/A 09/02/2014   Procedure: TRANSESOPHAGEAL ECHOCARDIOGRAM (TEE);  Surgeon: Lelon Perla, MD;  Location: [facility] ENDOSCOPY;  Service: Cardiovascular;  Laterality: N/A;    Allergies:  Allergies  Allergen Reactions   Amlodipine Shortness Of Breath and Rash   Fish Allergy  Hives   Penicillins Other (See Comments)    REACTION: red rash   Shellfish Allergy Hives   Peanuts [Peanut Oil] Rash   Tomato Rash    Medications:   Prior to Admission medications   Medication Sig Start Date End Date Taking? Authorizing Provider  acetaminophen (TYLENOL) 500 MG tablet Take 1,000 mg by mouth every 6 (six) hours as needed for moderate pain or headache.   Yes [provider]  atorvastatin (LIPITOR) 20 MG tablet Take 1 tablet (20 mg total) by mouth daily at 6 PM. Patient taking differently: Take 20 mg by mouth at bedtime. 02/19/15  Yes Hoyt Koch, MD  cetirizine (ZYRTEC) 10 MG tablet Take 10 mg by  mouth at bedtime.   Yes [provider]  clopidogrel (PLAVIX) 75 MG tablet Take 1 tablet (75 mg total) by mouth daily. OFFICE VISIT IS DUE 07/01/20  Yes Hoyt Koch, MD  Cyanocobalamin (VITAMIN B-12) 2500 MCG SUBL Place 2,500 mcg under the tongue daily.   Yes [provider]  dextromethorphan-guaiFENesin (MUCINEX DM) 30-600 MG 12hr tablet Take 1 tablet by mouth 2 (two) times daily as needed for cough.   Yes [provider]  glucose blood test strip Use to check blood sugars twice daily Patient taking differently: Check blood sugar every morning 01/26/19  Yes Hoyt Koch, MD  glycopyrrolate (ROBINUL) 1 MG tablet Take 1 mg by mouth at bedtime. 03/27/21  Yes [provider]  hydrALAZINE (APRESOLINE) 50 MG tablet Take 1 tablet (50 mg total) by mouth 2 (two) times daily. Annual appt due in July must see provider for future refills 11/20/19  Yes Hoyt Koch, MD  hydrochlorothiazide (HYDRODIURIL) 12.5 MG tablet Take 12.5 mg by mouth daily. 03/30/21  Yes [provider]  isosorbide mononitrate (IMDUR) 30 MG 24 hr tablet Take 30 mg by mouth daily. 02/13/21  Yes [provider]  JARDIANCE 10 MG TABS tablet TAKE 1 TABLET BY MOUTH EVERY DAY Patient taking differently: Take 10 mg by mouth daily. 02/23/19  Yes Hoyt Koch, MD  levETIRAcetam (KEPPRA) 500 MG tablet Take 500 mg by mouth at bedtime. 02/25/21  Yes [provider]  lisinopril (ZESTRIL) 20 MG tablet Take 1 tablet (20 mg total) by mouth daily. Needs office visit 06/23/20  Yes Hoyt Koch, MD  Metamucil Fiber CHEW Chew 1 tablet by mouth daily.   Yes [provider]  nitroGLYCERIN (NITROSTAT) 0.4 MG SL tablet Place 0.4 mg under the tongue every 5 (five) minutes as needed for chest pain.   Yes [provider]  ONE TOUCH ULTRA TEST test strip USE TO TEST TWICE DAILY (BEFORE BREAKFAST, BEDTIME) E11.4 Patient taking differently: Check blood  sugar every morning 01/03/18  Yes Hoyt Koch, MD  polyethylene glycol (MIRALAX / GLYCOLAX) packet Take 17 g by mouth daily as needed for moderate constipation.   Yes [provider]  vitamin E 180 MG (400 UNITS) capsule Take 400 Units by mouth daily.   Yes [provider]  blood glucose meter kit and supplies KIT Dispense what is covered by patients insurance with strips and lancets. Use twice a day (before breakfast and at bedtime). E11.40 01/26/19   Hoyt Koch, MD  glipiZIDE (GLUCOTROL XL) 2.5 MG 24 hr tablet Take 1 tablet (2.5 mg total) by mouth 2 (two) times daily. VISIT IS OVERDUE!!!!! Patient not taking: No sig reported 07/31/20   Hoyt Koch, MD  OneTouch Delica Lancets 11H MISC USE TO  TEST BLOOD SUGAR TWICE DAILY Patient taking differently: Check blood sugar every morning 07/11/19   Hoyt Koch, MD  [facility] ULTRA test strip USE TO CHECK BLOOD SUGAR TWICE DAILY Patient not taking: Reported on 04/29/2021 12/12/20   Hoyt Koch, MD    Discontinued Meds:   Medications Discontinued During This Encounter  Medication Reason   0.9 %  sodium chloride infusion     Social History:  reports that she has never smoked. She has never used smokeless tobacco. She reports current alcohol use. She reports that she does not use drugs.  Family History:   Family History  Problem Relation Age of Onset   Prostate cancer Father    Hypertension Father    Seizures Sister    Hypertension Sister    Bell's palsy Son    Heart attack Neg Hx    Stroke Neg Hx    Pertinent items are noted in HPI. Creatinine, Ser  Date/Time Value Ref Range Status  05/04/2021 03:53 AM 3.51 (H) 0.44 - 1.00 mg/dL Final  05/03/2021 08:35 PM 3.72 (H) 0.44 - 1.00 mg/dL Final  05/01/2021 01:59 AM 1.56 (H) 0.44 - 1.00 mg/dL Final  04/30/2021 01:49 AM 1.64 (H) 0.44 - 1.00 mg/dL Final  04/29/2021 03:42 PM 2.10 (H) 0.44 - 1.00 mg/dL Final  04/29/2021 02:37 PM 1.98 (H)  0.44 - 1.00 mg/dL Final  12/29/2018 12:07 PM 1.49 (H) 0.40 - 1.20 mg/dL Final  03/29/2018 09:40 AM 1.38 (H) 0.40 - 1.20 mg/dL Final  02/03/2017 12:15 PM 1.48 (H) 0.40 - 1.20 mg/dL Final  06/29/2016 02:49 PM 1.53 (H) 0.40 - 1.20 mg/dL Final  06/24/2016 07:09 AM 1.23 (H) 0.44 - 1.00 mg/dL Final  06/23/2016 05:11 AM 1.36 (H) 0.44 - 1.00 mg/dL Final  06/20/2016 01:57 AM 1.40 (H) 0.44 - 1.00 mg/dL Final  06/19/2016 10:55 PM 1.40 (H) 0.44 - 1.00 mg/dL Final  06/19/2016 10:35 PM 1.40 (H) 0.44 - 1.00 mg/dL Final  02/25/2016 09:49 AM 1.37 (H) 0.40 - 1.20 mg/dL Final  12/08/2015 10:10 AM 1.30 (H) 0.40 - 1.20 mg/dL Final  06/10/2015 11:05 AM 1.07 0.40 - 1.20 mg/dL Final  01/15/2015 03:00 AM 1.32 (H) 0.44 - 1.00 mg/dL Final  12/05/2014 10:47 AM 1.20 0.40 - 1.20 mg/dL Final  09/06/2014 06:40 AM 1.30 (H) 0.50 - 1.10 mg/dL Final  09/05/2014 07:44 PM 1.35 (H) 0.50 - 1.10 mg/dL Final  09/05/2014 05:33 AM 1.19 (H) 0.50 - 1.10 mg/dL Final  09/03/2014 06:15 AM 1.01 0.50 - 1.10 mg/dL Final  09/01/2014 06:15 AM 1.09 0.50 - 1.10 mg/dL Final  08/30/2014 05:05 PM 1.22 (H) 0.50 - 1.10 mg/dL Final  08/30/2014 11:46 AM 1.08 0.50 - 1.10 mg/dL Final  08/18/2014 11:45 AM 1.09 0.50 - 1.10 mg/dL Final  01/08/2014 10:41 AM 1.2 0.4 - 1.2 mg/dL Final  09/11/2013 12:16 PM 1.1 0.4 - 1.2 mg/dL Final  09/07/2012 09:54 AM 1.1 0.4 - 1.2 mg/dL Final  09/07/2011 11:22 AM 0.9 0.4 - 1.2 mg/dL Final  08/25/2010 02:56 PM 1.0 0.4 - 1.2 mg/dL Final  11/19/2009 11:21 AM 1.1 0.4 - 1.2 mg/dL Final  09/22/2009 11:15 AM 1.25 (H) 0.4 - 1.2 mg/dL Final  04/02/2008 11:02 AM 1.0 0.4 - 1.2 mg/dL Final  08/26/2006 07:55 AM 1.0 0.4 - 1.2 mg/dL Final   Recent Labs  Lab 04/29/21 1437 04/29/21 1542 04/30/21 0149 05/01/21 0159 05/03/21 2035 05/04/21 0353  NA 139 144 140 135 139 139  K 4.6 4.4 4.4 4.0 5.5* 5.3*  CL 108  113* 111 105 109 111  CO2 20*  --  19* 20* 17* 14*  GLUCOSE 142* 138* 101* 155* 152* 138*  BUN 49* 48* 42* 37* 55* 55*   CREATININE 1.98* 2.10* 1.64* 1.56* 3.72* 3.51*  CALCIUM 8.7*  --  8.7* 8.5* 9.1 8.7*   Recent Labs  Lab 04/29/21 1437 04/29/21 1542 05/03/21 2035 05/04/21 0353  WBC 5.0  --  6.8 6.0  NEUTROABS 3.3  --   --  3.4  HGB 15.0 16.0* 15.9* 14.7  HCT 47.2* 47.0* 50.5* 46.7*  MCV 91.5  --  93.0 91.6  PLT 226  --  253 234   Liver Function Tests: Recent Labs  Lab 04/29/21 1437 05/04/21 0353  AST 14* 30  ALT 11 18  ALKPHOS 86 59  BILITOT 0.9 1.4*  PROT 7.2 6.4*  ALBUMIN 3.2* 3.0*   No results for input(s): LIPASE, AMYLASE in the last 168 hours. No results for input(s): AMMONIA in the last 168 hours. Cardiac Enzymes: No results for input(s): CKTOTAL, CKMB, CKMBINDEX, TROPONINI in the last 168 hours. Iron Studies: No results for input(s): IRON, TIBC, TRANSFERRIN, FERRITIN in the last 72 hours.  Results for orders placed or performed during the hospital encounter of 05/03/21 (from the past 48 hour(s))  Troponin I (High Sensitivity)     Status: Abnormal   Collection Time: 05/03/21  1:37 AM  Result Value Ref Range   Troponin I (High Sensitivity) 335 (HH) <18 ng/L    Comment: CRITICAL VALUE NOTED.  VALUE IS CONSISTENT WITH PREVIOUSLY REPORTED AND CALLED VALUE. (NOTE) Elevated high sensitivity troponin I (hsTnI) values and significant  changes across serial measurements may suggest ACS but many other  chronic and acute conditions are known to elevate hsTnI results.  Refer to the Links section for chest pain algorithms and additional  guidance. Performed at [facility], [place] [address], [place], [place] [address]   Basic metabolic panel     Status: Abnormal   Collection Time: 05/03/21  8:35 PM  Result Value Ref Range   Sodium 139 135 - 145 mmol/L   Potassium 5.5 (H) 3.5 - 5.1 mmol/L   Chloride 109 98 - 111 mmol/L   CO2 17 (L) 22 - 32 mmol/L   Glucose, Bld 152 (H) 70 - 99 mg/dL    Comment: Glucose reference range applies only to samples taken after fasting for at least 8  hours.   BUN 55 (H) 8 - 23 mg/dL   Creatinine, Ser 3.72 (H) 0.44 - 1.00 mg/dL   Calcium 9.1 8.9 - 10.3 mg/dL   GFR, Estimated 12 (L) >60 mL/min    Comment: (NOTE) Calculated using the CKD-EPI Creatinine Equation (2021)    Anion gap 13 5 - 15    Comment: Performed at [facility] [address], [place], [place] [address]  Troponin I (High Sensitivity)     Status: Abnormal   Collection Time: 05/03/21  8:35 PM  Result Value Ref Range   Troponin I (High Sensitivity) 553 (HH) <18 ng/L    Comment: CRITICAL RESULT CALLED TO, READ BACK BY AND VERIFIED WITH: Juel Burrow RN 05/03/21 2137 M KOROLESKI (NOTE) Elevated high sensitivity troponin I (hsTnI) values and significant  changes across serial measurements may suggest ACS but many other  chronic and acute conditions are known to elevate hsTnI results.  Refer to the Links section for chest pain algorithms and additional  guidance. Performed at [facility], [place] [address],  [place], [place] [address]   CBC     Status: Abnormal   Collection Time: 05/03/21  8:35 PM  Result Value Ref Range   WBC 6.8 4.0 - 10.5 K/uL   RBC 5.43 (H) 3.87 - 5.11 MIL/uL   Hemoglobin 15.9 (H) 12.0 - 15.0 g/dL   HCT 50.5 (H) 36.0 - 46.0 %   MCV 93.0 80.0 - 100.0 fL   MCH 29.3 26.0 - 34.0 pg   MCHC 31.5 30.0 - 36.0 g/dL   RDW 18.0 (H) 11.5 - 15.5 %   Platelets 253 150 - 400 K/uL   nRBC 0.0 0.0 - 0.2 %    Comment: Performed at [facility] [address], [place], [place] [address]  Magnesium     Status: None   Collection Time: 05/03/21  8:35 PM  Result Value Ref Range   Magnesium 2.4 1.7 - 2.4 mg/dL    Comment: Performed at [facility], [place] [address], [place], [place] [address]  CBG monitoring, ED     Status: Abnormal   Collection Time: 05/03/21 10:01 PM  Result Value Ref Range   Glucose-Capillary 142 (H) 70 - 99 mg/dL    Comment: Glucose reference range applies only to samples taken after fasting for at least 8 hours.   Resp Panel by RT-PCR (Flu A&B, Covid) Nasopharyngeal Swab     Status: Abnormal   Collection Time: 05/03/21 10:23 PM   Specimen: Nasopharyngeal Swab; Nasopharyngeal(NP) swabs in vial transport medium  Result Value Ref Range   SARS Coronavirus 2 by RT PCR POSITIVE (A) NEGATIVE    Comment: RESULT CALLED TO, READ BACK BY AND VERIFIED WITH: C CRISCOE,RN$RemoveBeforeDEI'@[id number]'[id number]$  05/04/21 [place] (NOTE) SARS-CoV-2 target nucleic acids are DETECTED.  The SARS-CoV-2 RNA is generally detectable in upper respiratory specimens during the acute phase of infection. Positive results are indicative of the presence of the identified virus, but do not rule out bacterial infection or co-infection with other pathogens not detected by the test. Clinical correlation with patient history and other diagnostic information is necessary to determine patient infection status. The expected result is Negative.  Fact Sheet for Patients: EntrepreneurPulse.com.au  Fact Sheet for Healthcare Providers: IncredibleEmployment.be  This test is not yet approved or cleared by the [place] FDA and  has been authorized for detection and/or diagnosis of SARS-CoV-2 by FDA under an Emergency Use Authorization (EUA).  This EUA will remain in effect (meaning this test can be use d) for the duration of  the COVID-19 declaration under Section 564(b)(1) of the Act, 21 U.S.C. section 360bbb-3(b)(1), unless the authorization is terminated or revoked sooner.     Influenza A by PCR NEGATIVE NEGATIVE   Influenza B by PCR NEGATIVE NEGATIVE    Comment: (NOTE) The Xpert Xpress SARS-CoV-2/FLU/RSV plus assay is intended as an aid in the diagnosis of influenza from Nasopharyngeal swab specimens and should not be used as a sole basis for treatment. Nasal washings and aspirates are unacceptable for Xpert Xpress SARS-CoV-2/FLU/RSV testing.  Fact Sheet for Patients: EntrepreneurPulse.com.au  Fact Sheet  for Healthcare Providers: IncredibleEmployment.be  This test is not yet approved or cleared by the [place] FDA and has been authorized for detection and/or diagnosis of SARS-CoV-2 by FDA under an Emergency Use Authorization (EUA). This EUA will remain in effect (meaning this test can be used) for the duration of the COVID-19 declaration under Section 564(b)(1) of the Act, 21 U.S.C. section 360bbb-3(b)(1), unless the authorization is terminated or revoked.  Performed at [facility], [place] [address] [place], [place] [address]   Basic metabolic panel     Status: Abnormal   Collection Time: 05/04/21  3:53 AM  Result Value Ref Range   Sodium 139 135 - 145 mmol/L   Potassium 5.3 (H) 3.5 - 5.1 mmol/L   Chloride 111 98 - 111 mmol/L   CO2 14 (L) 22 - 32 mmol/L   Glucose, Bld 138 (H) 70 - 99 mg/dL    Comment: Glucose reference range applies only to samples taken after fasting for at least 8 hours.   BUN 55 (H) 8 - 23 mg/dL   Creatinine, Ser 3.51 (H) 0.44 - 1.00 mg/dL   Calcium 8.7 (L) 8.9 - 10.3 mg/dL   GFR, Estimated 13 (L) >60 mL/min    Comment: (NOTE) Calculated using the CKD-EPI Creatinine Equation (2021)    Anion gap 14 5 - 15    Comment: Performed at [facility] [address] [place], [place] [address]  Hepatic function panel     Status: Abnormal   Collection Time: 05/04/21  3:53 AM  Result Value Ref Range   Total Protein 6.4 (L) 6.5 - 8.1 g/dL   Albumin 3.0 (L) 3.5 - 5.0 g/dL   AST 30 15 - 41 U/L   ALT 18 0 - 44 U/L   Alkaline Phosphatase 59 38 - 126 U/L   Total Bilirubin 1.4 (H) 0.3 - 1.2 mg/dL   Bilirubin, Direct 0.3 (H) 0.0 - 0.2 mg/dL   Indirect Bilirubin 1.1 (H) 0.3 - 0.9 mg/dL    Comment: Performed at [facility] [address], [place], [place] [address]  Magnesium     Status: Abnormal   Collection Time: 05/04/21  3:53 AM  Result Value Ref Range   Magnesium 2.5 (H) 1.7 - 2.4 mg/dL    Comment: Performed at [facility] [address], [place], [place] [address]  CBC WITH DIFFERENTIAL     Status: Abnormal   Collection Time: 05/04/21  3:53 AM  Result Value Ref Range   WBC 6.0 4.0 - 10.5 K/uL   RBC 5.10 3.87 - 5.11 MIL/uL   Hemoglobin 14.7 12.0 - 15.0 g/dL   HCT 46.7 (H) 36.0 - 46.0 %   MCV 91.6 80.0 - 100.0 fL   MCH 28.8 26.0 - 34.0 pg   MCHC 31.5 30.0 - 36.0 g/dL   RDW 17.7 (H) 11.5 - 15.5 %   Platelets 234 150 - 400 K/uL    Comment: REPEATED TO VERIFY   nRBC 0.0 0.0 - 0.2 %   Neutrophils Relative % 57 %   Neutro Abs 3.4 1.7 - 7.7 K/uL   Lymphocytes Relative 22 %   Lymphs Abs 1.3 0.7 - 4.0 K/uL   Monocytes Relative 15 %   Monocytes Absolute 0.9 0.1 - 1.0 K/uL   Eosinophils Relative 3 %   Eosinophils Absolute 0.2 0.0 - 0.5 K/uL   Basophils Relative 1 %   Basophils Absolute 0.1 0.0 - 0.1 K/uL   Immature Granulocytes 2 %   Abs Immature Granulocytes 0.11 (H) 0.00 - 0.07 K/uL    Comment: Performed at [facility] [address], [place], [place] [address]  TSH     Status: None   Collection Time: 05/04/21  3:53 AM  Result Value Ref Range   TSH 1.260 0.350 - 4.500 uIU/mL    Comment: Performed by a 3rd Generation assay with a functional sensitivity of <=0.01  uIU/mL. Performed at [facility], [place] [address] [place], [place] [address]   Troponin I (High Sensitivity)     Status: Abnormal   Collection Time: 05/04/21  3:53 AM  Result Value Ref Range   Troponin I (High Sensitivity) 243 (HH) <18 ng/L    Comment: CRITICAL VALUE NOTED.  VALUE IS CONSISTENT WITH PREVIOUSLY REPORTED AND CALLED VALUE. (NOTE) Elevated high sensitivity troponin I (hsTnI) values and significant  changes across serial measurements may suggest ACS but many other  chronic and acute conditions are known to elevate hsTnI results.  Refer to the Links section for chest pain algorithms and additional  guidance. Performed at [facility], [place] [address] [place], [place] [address]     DG Chest  Portable 1 View  Result Date: 05/03/2021 CLINICAL DATA:  Bradycardia EXAM: PORTABLE CHEST 1 VIEW COMPARISON:  04/29/2021 FINDINGS: Lungs are symmetrically expanded and are clear. No pneumothorax or pleural effusion. Cardiac size within normal limits. Pulmonary vascularity is normal. Osseous structures are age-appropriate. No acute bone abnormality. IMPRESSION: No active disease. Electronically Signed   By: Fidela Salisbury M.D.   On: 05/03/2021 20:30     Blood pressure 122/75, pulse 87, resp. rate 17, SpO2 100 %. General: Awake, alert, oriented to person, place, time and situation, in no acute distress, pleasant, appears stated age, hard of hearing HEENT: Normocephalic, dry mucous membranes, dressing to right neck is clean, dry and intact Cardio: RRR, 2+ radial, DP pulses b/l, no pitting edema Respiratory: CTAB without wheezing/rhonchi/rales Abdomen: Soft, non-tender to palpation of all quadrants, non-distended, no rebound/guarding Extremities: without edema or cyanosis Neuro: Speech is clear and intact, she is hard of hearing, able to follow commands, answers most questions in short phrases "yes", "no", "alright" Psych: Normal mood and affect  Assessment/Plan: ROSLYN ELSE is a 80 y/o F with PMHx significant for CAD s/p PCI, CKD stage 3b, T2DM, HTN, HLD, previous CVA, recent COVID-19 infection (dx 10/30) who was admitted for bradycardia and found to have complete AV block. She has since developed a significant AKI and nephrology has been consulted.   AKI on CKD Stage 3b: Creatinine 1.56 at time of discharge on 11/11, increased to 3.72 at time or re-admission on 11/13. Baseline appears to be ~1.45. Likely pre-renal and multifactorial in the setting of dehydration with recent COVID infection, complete heart block, bradycardia. There is about 400 cc UOP in bag. Agree with renal U/S, UA, urine protein creatinine ratio. Monitor strict I/O, daily weights. Avoid nephrotoxic agents as possible. Starting  to make some urine, continue with IVF"s and continue to hold lisinopril.  No indication for dialysis at this time.  Renal US without obstruction.  Avoid phosphorous containing bowel preps (FLEETS), NSAIDs, and IV contrast.  Non-Anion Gap Metabolic Acidosis: Bicarb 14. Continue sodium bicarb gtt.  Hyperkalemia: K 5.3. Sodium bicarb gtt. Lokelma 10 g ordered.  Elevated Troponin: Down-trending 553>335. Cardiology on board. Uremia: BUN 55 Complete Heart Block: Emergent temporary pacemaker placement per cardiology.  Recent COVID infection: Diagnosed 10/30, still in isolation period. Contact and airborne precautions. CXR 11/13 without signs of pneumonia. HTN: Home Lisinopril-HCTZ held. On imdur and hydralazine. BP 831'D systolic. Type 2 DM: Hgb A1c 8.7%. CBG, on SSI. Per primary. HLD  Hx of CVA: Continue statin, plavix History of seizures: on Keppra, per primary.  Kayla [name] 05/04/2021, 8:25 AM    I have seen and examined this patient and agree with plan and assessment in the above note with renal  recommendations/intervention highlighted. Likely has had poor po intake, volume depletion, and possible ischemic event with bradycardia.  Continue with IVF's and follow UOP and daily Scr.  Broadus John A Caesar Mannella,MD 05/04/2021 11:47 AM

## 2021-05-04 NOTE — Consult Note (Addendum)
ELECTROPHYSIOLOGY CONSULT NOTE    Patient ID: Kayla [name] MRN: [id number], DOB/AGE: 81/03/1941 81 y.o.  Admit date: 05/03/2021 Date of Consult: 05/04/2021  Primary Physician: Cipriano Mile, NP Primary Cardiologist: Fransico Him, MD  Electrophysiologist: Dr. Rayann Heman   Referring Provider: Dr. Bonner Puna  Patient Profile: Kayla [name] is a 80 y.o. female with a history of CAD s/p PCI, poorly controlled DM2, CKD III, Breast CA, GERD, Sickle cell trait, h/o CVA, recent COVID infection (04/19/21) and ? Medical non compliance who is being seen today for the evaluation of bradycardia at the request of Dr. Bonner Puna.  HPI:  Kayla [name] is a 80 y.o. female with complicated medical history as above.   Recently admitted from 11/9 to 11/11 for 2:1 heart block after presenting with loss of appetite and lethargy. ECG showed 2:1 AV block with new LBBB and PR of 200 ms. She also had an AKI with Cr of 2.1 from baseline of around 1.5, improving by discharge with IV fluids. TTE was unrevealing. Conduction improved with discontinuation of atenolol and she was discharged home with plan for observation.   Her son who is present with her today states she had a "good day Saturday" but yesterday was more fatigued and confused. They checked her vitals and noted HRs in the 30s, so reported to ED. She was found to be in CHB with escape rhythm in 20-30s but normotensive.   Labs showed significant AKI Cr 1.56 -> 3.7 and K of 5.5. HSTrop 500. She was treated for hyperkalemia and her rhythm initially improved. She continues to intermittently lose conduction and drop HRs into the 20s.   She response to voice currently but doesn't answer questions. Son is present who agrees with proceeding with temp wire and discussing further options as she improves.   Past Medical History:  Diagnosis Date   Allergy    Shell fish   Anxiety    Arthritis    "knees, back, probably left hand" (01/14/2015)   Asthmatic bronchitis    Blind  left eye    CAD (coronary artery disease)    cath in 2016 showing 40% ostial RCA, 40% ostial D1, 90% proximal D1, 85% and 70% sequential distal LAD stenosis and underwent PCI with stents in the D1 and LAD.   Constipation    Depression    Diverticulosis of colon    DJD (degenerative joint disease)    Fibromyalgia    GERD (gastroesophageal reflux disease)    Heart murmur    History of gout    Hypercholesteremia    Hypertension    Personal history of noncompliance with medical treatment, presenting hazards to health    Prolapse of vaginal vault after hysterectomy    Proteinuria    Sickle-cell trait ([place])    Somatic dysfunction    Stroke ([place]) ~ 08/2014   "blind in left eye; weak on left side since" (01/14/2015)   Type II diabetes mellitus ([place])    Venous insufficiency      Surgical History:  Past Surgical History:  Procedure Laterality Date   ABDOMINAL HYSTERECTOMY     CARDIAC CATHETERIZATION N/A 01/14/2015   Procedure: Left Heart Cath and Coronary Angiography;  Surgeon: Charolette Forward, MD;  Location: [facility];  Service: Cardiovascular;  Laterality: N/A;   CARDIAC CATHETERIZATION  ~ 2011   CATARACT EXTRACTION W/ INTRAOCULAR LENS  IMPLANT, BILATERAL Bilateral 2012   CORONARY ANGIOPLASTY     DILATION AND CURETTAGE OF UTERUS  "probably"  LOOP RECORDER IMPLANT N/A 09/03/2014   Procedure: LOOP RECORDER IMPLANT;  Surgeon: Thompson Grayer, MD;  Location: [facility] CATH LAB;  Service: Cardiovascular;  Laterality: N/A;   ROBOTIC ASSISTED LAPAROSCOPIC SACROCOLPOPEXY  09/2009   Dr. Matilde Sprang   TEE WITHOUT CARDIOVERSION N/A 09/02/2014   Procedure: TRANSESOPHAGEAL ECHOCARDIOGRAM (TEE);  Surgeon: Lelon Perla, MD;  Location: [facility] ENDOSCOPY;  Service: Cardiovascular;  Laterality: N/A;     (Not in a hospital admission)   Inpatient Medications:   atorvastatin  20 mg Oral QHS   clopidogrel  75 mg Oral Daily   heparin  5,000 Units Subcutaneous Q8H   hydrALAZINE  50 mg Oral BID   insulin  aspart  0-9 Units Subcutaneous TID WC   isosorbide mononitrate  30 mg Oral Daily   levETIRAcetam  500 mg Oral QHS   sodium zirconium cyclosilicate  10 g Oral Once   vitamin B-12  2,500 mcg Oral Daily    Allergies:  Allergies  Allergen Reactions   Amlodipine Shortness Of Breath and Rash   Fish Allergy Hives   Penicillins Other (See Comments)    REACTION: red rash   Shellfish Allergy Hives   Peanuts [Peanut Oil] Rash   Tomato Rash    Social History   Socioeconomic History   Marital status: Married    Spouse name: Not on file   Number of children: 4   Years of education: 13   Highest education level: Not on file  Occupational History   Occupation: [employer]    Comment: retired  Tobacco Use   Smoking status: Never   Smokeless tobacco: Never  Substance and Sexual Activity   Alcohol use: Yes    Alcohol/week: 0.0 standard drinks    Comment: 01/14/2015 "might have a glass of wine a few times/yr"   Drug use: No   Sexual activity: Never  Other Topics Concern   Not on file  Social History Narrative   Married   Right handed   Caffeine use - 1 cup coffee daily   Social Determinants of Health   Financial Resource Strain: Not on file  Food Insecurity: Not on file  Transportation Needs: Not on file  Physical Activity: Not on file  Stress: Not on file  Social Connections: Not on file  Intimate Partner Violence: Not on file     Family History  Problem Relation Age of Onset   Prostate cancer Father    Hypertension Father    Seizures Sister    Hypertension Sister    Bell's palsy Son    Heart attack Neg Hx    Stroke Neg Hx      Review of Systems: All other systems reviewed and are otherwise negative except as noted above.  Physical Exam: Vitals:   05/03/21 2230 05/03/21 2300 05/03/21 2310 05/04/21 0615  BP: (!) 136/38 (!) 141/74 109/76 122/75  Pulse: (!) 40   87  Resp: 19 (!) 27 (!) 24 17  SpO2: 100% 100%  98%    GEN- The patient is well appearing, alert and  oriented x 3 today.   HEENT: normocephalic, atraumatic; sclera clear, conjunctiva pink; hearing intact; oropharynx clear; neck supple Lungs- Clear to ausculation bilaterally, normal work of breathing.  No wheezes, rales, rhonchi Heart- Regular rate and rhythm, no murmurs, rubs or gallops GI- soft, non-tender, non-distended, bowel sounds present Extremities- no clubbing, cyanosis, or edema; DP/PT/radial pulses 2+ bilaterally MS- no significant deformity or atrophy Skin- warm and dry, no rash or lesion Psych- euthymic mood, full  affect Neuro- strength and sensation are intact  Labs:   Lab Results  Component Value Date   WBC 6.0 05/04/2021   HGB 14.7 05/04/2021   HCT 46.7 (H) 05/04/2021   MCV 91.6 05/04/2021   PLT 234 05/04/2021    Recent Labs  Lab 05/04/21 0353  NA 139  K 5.3*  CL 111  CO2 14*  BUN 55*  CREATININE 3.51*  CALCIUM 8.7*  PROT 6.4*  BILITOT 1.4*  ALKPHOS 59  ALT 18  AST 30  GLUCOSE 138*      Radiology/Studies: DG Chest Portable 1 View  Result Date: 05/03/2021 CLINICAL DATA:  Bradycardia EXAM: PORTABLE CHEST 1 VIEW COMPARISON:  04/29/2021 FINDINGS: Lungs are symmetrically expanded and are clear. No pneumothorax or pleural effusion. Cardiac size within normal limits. Pulmonary vascularity is normal. Osseous structures are age-appropriate. No acute bone abnormality. IMPRESSION: No active disease. Electronically Signed   By: Fidela Salisbury M.D.   On: 05/03/2021 20:30   DG Chest Portable 1 View  Result Date: 04/29/2021 CLINICAL DATA:  80 year old female with history of weakness. EXAM: PORTABLE CHEST 1 VIEW COMPARISON:  Chest x-ray 06/19/2016. FINDINGS: Transcutaneous defibrillator pad projecting over the left hemithorax. Lung volumes are low. No consolidative airspace disease. No pleural effusions. No pneumothorax. No evidence of pulmonary edema. Heart size is normal. Mediastinal contours are distorted by patient positioning. Atherosclerotic calcifications in the  thoracic aorta. IMPRESSION: 1. Low lung volumes without radiographic evidence of acute cardiopulmonary disease. 2. Aortic atherosclerosis. Electronically Signed   By: Vinnie Langton M.D.   On: 04/29/2021 15:23   ECHOCARDIOGRAM COMPLETE  Result Date: 04/30/2021    ECHOCARDIOGRAM REPORT   Patient Name:   Kayla [name] Date of Exam: 04/30/2021 Medical Rec #:  [id number]      Height:       66.5 in Accession #:    [id number]     Weight:       166.9 lb Date of Birth:  Nov 11, 1940      BSA:          1.862 m Patient Age:    52 years       BP:           148/57 mmHg Patient Gender: F              HR:           47 bpm. Exam Location:  Inpatient Procedure: 2D Echo Indications:    second degree heart block  History:        Patient has prior history of Echocardiogram examinations, most                 recent 09/02/2014. CAD, Arrythmias:2nd degree av block; Risk                 Factors:Hypertension, Dyslipidemia and Diabetes.  Sonographer:    Johny Chess RDCS Referring Phys: Liberal Comments: Suboptimal parasternal window. IMPRESSIONS  1. Left ventricular ejection fraction, by estimation, is 60 to 65%. The left ventricle has normal function. The left ventricle has no regional wall motion abnormalities. Left ventricular diastolic parameters are indeterminate.  2. Right ventricular systolic function is normal. The right ventricular size is normal.  3. The mitral valve is normal in structure. Mild mitral valve regurgitation. No evidence of mitral stenosis.  4. The aortic valve is normal in structure. Aortic valve regurgitation is not visualized. No aortic stenosis is present.  5. The inferior vena cava is  normal in size with greater than 50% respiratory variability, suggesting right atrial pressure of 3 mmHg. FINDINGS  Left Ventricle: Left ventricular ejection fraction, by estimation, is 60 to 65%. The left ventricle has normal function. The left ventricle has no regional wall motion abnormalities.  The left ventricular internal cavity size was normal in size. There is  no left ventricular hypertrophy. Left ventricular diastolic parameters are indeterminate. Right Ventricle: The right ventricular size is normal. No increase in right ventricular wall thickness. Right ventricular systolic function is normal. Left Atrium: Left atrial size was normal in size. Right Atrium: Right atrial size was normal in size. Pericardium: There is no evidence of pericardial effusion. Presence of pericardial fat pad. Mitral Valve: The mitral valve is normal in structure. Mild mitral valve regurgitation. No evidence of mitral valve stenosis. Tricuspid Valve: The tricuspid valve is normal in structure. Tricuspid valve regurgitation is not demonstrated. No evidence of tricuspid stenosis. Aortic Valve: The aortic valve is normal in structure. Aortic valve regurgitation is not visualized. No aortic stenosis is present. Aortic valve mean gradient measures 7.0 mmHg. Aortic valve peak gradient measures 14.0 mmHg. Pulmonic Valve: The pulmonic valve was normal in structure. Pulmonic valve regurgitation is not visualized. No evidence of pulmonic stenosis. Aorta: The aortic root is normal in size and structure. Venous: The inferior vena cava is normal in size with greater than 50% respiratory variability, suggesting right atrial pressure of 3 mmHg. IAS/Shunts: No atrial level shunt detected by color flow Doppler.  LEFT VENTRICLE PLAX 2D LVIDd:         3.50 cm Diastology LVIDs:         2.20 cm LV e' medial:    13.70 cm/s                        LV E/e' medial:  10.9                        LV e' lateral:   11.70 cm/s                        LV E/e' lateral: 12.8  RIGHT VENTRICLE             IVC RV S prime:     12.60 cm/s  IVC diam: 1.90 cm TAPSE (M-mode): 2.0 cm LEFT ATRIUM             Index        RIGHT ATRIUM           Index LA Vol (A2C):   30.1 ml 16.16 ml/m  RA Area:     11.60 cm LA Vol (A4C):   51.8 ml 27.82 ml/m  RA Volume:   25.80 ml   13.85 ml/m LA Biplane Vol: 41.6 ml 22.34 ml/m  AORTIC VALVE AV Vmax:           187.00 cm/s AV Vmean:          118.000 cm/s AV VTI:            0.438 m AV Peak Grad:      14.0 mmHg AV Mean Grad:      7.0 mmHg LVOT Vmax:         145.00 cm/s LVOT Vmean:        95.400 cm/s LVOT VTI:          0.375 m LVOT/AV VTI ratio: 0.86  AORTA Ao Asc  diam: 3.10 cm MITRAL VALVE MV Area (PHT): 4.63 cm     SHUNTS MV Decel Time: 164 msec     Systemic VTI: 0.38 m MV E velocity: 150.00 cm/s MV A velocity: 139.00 cm/s MV E/A ratio:  1.08 Kardie Tobb DO Electronically signed by Berniece Salines DO Signature Date/Time: 04/30/2021/6:06:49 PM    Final     QZR:AQTM am shows CHB with escape at 20  (personally reviewed)  TELEMETRY: Intermittent conduction (personally reviewed)   Assessment/Plan: 1.  Intermittent CHB BB stopped last week.  Suspect AKI is in setting of loss of conduction  Currently externally pacing with intermittent loss of capture and intermittent intrinsic conduction.  Plan for temp wire this am with unstable conduction, and plan to discuss permanent pacemaker pending course  2. AKI Likely in setting of CHB  3. Uremia/Encephalopathy Unclear baseline, though she was noted to be more communicative in notes from last week.  4. HTN Lisinopril and HCTZ held due to acute renal failure and hyperkalemia  5. Recent COVID infection Home test + 04/19/21. She is out of contact precaution window.   6. H/o SVT Previously identified on loop recorder (RRT 2019)  7. H/o CVA She had a loop recorder implanted in 2016 that showed SVT. It does not appear to have shown brady or AF events.   Dr. Quentin Ore has seen with plan as above.   For questions or updates, please contact [place] Please consult [email or web address] for contact info under Cardiology/STEMI.  Jacalyn Lefevre, PA-C  05/04/2021 7:42 AM

## 2021-05-04 NOTE — ED Notes (Signed)
The pt is asleep with her son at her bedside

## 2021-05-05 DIAGNOSIS — E876 Hypokalemia: Secondary | ICD-10-CM

## 2021-05-05 LAB — RENAL FUNCTION PANEL
Albumin: 2.6 g/dL — ABNORMAL LOW (ref 3.5–5.0)
Anion gap: 11 (ref 5–15)
BUN: 39 mg/dL — ABNORMAL HIGH (ref 8–23)
CO2: 28 mmol/L (ref 22–32)
Calcium: 8 mg/dL — ABNORMAL LOW (ref 8.9–10.3)
Chloride: 99 mmol/L (ref 98–111)
Creatinine, Ser: 2.19 mg/dL — ABNORMAL HIGH (ref 0.44–1.00)
GFR, Estimated: 22 mL/min — ABNORMAL LOW (ref >60)
Glucose, Bld: 186 mg/dL — ABNORMAL HIGH (ref 70–99)
Phosphorus: 3.2 mg/dL (ref 2.5–4.6)
Potassium: 3.3 mmol/L — ABNORMAL LOW (ref 3.5–5.1)
Sodium: 138 mmol/L (ref 135–145)

## 2021-05-05 LAB — GLUCOSE, CAPILLARY
Glucose-Capillary: 204 mg/dL — ABNORMAL HIGH (ref 70–99)
Glucose-Capillary: 170 mg/dL — ABNORMAL HIGH (ref 70–99)
Glucose-Capillary: 117 mg/dL — ABNORMAL HIGH (ref 70–99)
Glucose-Capillary: 164 mg/dL — ABNORMAL HIGH (ref 70–99)

## 2021-05-05 MED ORDER — POTASSIUM CHLORIDE CRYS ER 20 MEQ PO TBCR
20.0000 meq | EXTENDED_RELEASE_TABLET | Freq: Once | ORAL | Status: AC
  Administered 2021-05-05: 20 meq via ORAL
  Filled 2021-05-05: qty 1

## 2021-05-05 MED ORDER — LACTATED RINGERS IV SOLN
INTRAVENOUS | Status: DC
Start: 2021-05-05 — End: 2021-05-08

## 2021-05-05 MED ORDER — ACETAMINOPHEN 325 MG PO TABS
650.0000 mg | ORAL_TABLET | Freq: Four times a day (QID) | ORAL | Status: DC | PRN
  Administered 2021-05-05: 650 mg via ORAL
  Filled 2021-05-05: qty 2

## 2021-05-05 MED ORDER — GUAIFENESIN-DM 100-10 MG/5ML PO SYRP
5.0000 mL | ORAL_SOLUTION | ORAL | Status: DC | PRN
  Administered 2021-05-05 – 2021-05-06 (×2): 5 mL via ORAL
  Filled 2021-05-05 (×3): qty 5

## 2021-05-05 MED ORDER — ENSURE ENLIVE PO LIQD: 237.0000 mL | Freq: Two times a day (BID) | ORAL | Status: DC

## 2021-05-05 MED ORDER — ALBUTEROL SULFATE (2.5 MG/3ML) 0.083% IN NEBU: 2.5000 mg | INHALATION_SOLUTION | Freq: Four times a day (QID) | RESPIRATORY_TRACT | Status: DC | PRN

## 2021-05-05 MED ORDER — NEPRO/CARBSTEADY PO LIQD
237.0000 mL | Freq: Two times a day (BID) | ORAL | Status: DC
  Administered 2021-05-06: 237 mL via ORAL

## 2021-05-05 NOTE — Progress Notes (Addendum)
[facility] Progress Note  Name: Kayla [name] MRN: [id number] DOB: 17-Mar-1941  Subjective:  Kayla [name] is accompanied by her son this morning. He reports that she seems to be getting weaker since she has just been laying in bed. His concern is how she will get around at home after this. Kayla [name] feels well this morning. She has no complaints.   Review of systems:  Denies any nausea, vomiting, abdominal pain, swelling.    Intake/Output Summary (Last 24 hours) at 05/05/2021 0819 Last data filed at 05/05/2021 0630 Gross per 24 hour  Intake 2362.5 ml  Output 2475 ml  Net -112.5 ml   Vitals:  Vitals:   05/05/21 0500 05/05/21 0600 05/05/21 0635 05/05/21 0700  BP: 140/76 112/65  138/73  Pulse: 93 92  87  Resp: 16 (!) 24  (!) 24  Temp:   98.3 F (36.8 C)   TempSrc:   Oral   SpO2: 96% 96%  97%  Weight:  81.1 kg       Physical Exam:  Blood pressure 138/73, pulse 87, temperature 98.3 F (36.8 C), temperature source Oral, resp. rate (!) 24, weight 81.1 kg, SpO2 97 %. General: Awake, alert, in no acute distress, pleasant, appears stated age, hard of hearing HEENT: Normocephalic, dry mucous membranes, dressing to right neck is clean, dry and intact Cardio: RRR, 2+ radial, DP pulses b/l, no pitting edema Respiratory: CTAB without wheezing/rhonchi/rales Abdomen: Soft, non-tender to palpation of all quadrants, non-distended, no rebound/guarding Extremities: without edema or cyanosis Neuro: Speech is clear and intact, she is hard of hearing, able to follow commands, answers most questions in short phrases  Psych: Normal mood and affect  Medications reviewed   Labs:  BMP Latest Ref Rng & Units 05/05/2021 05/04/2021 05/04/2021  Glucose 70 - 99 mg/dL 186(H) 185(H) 188(H)  BUN 8 - 23 mg/dL 39(H) 47(H) 47(H)  Creatinine 0.44 - 1.00 mg/dL 2.19(H) 2.56(H) 2.81(H)  Sodium 135 - 145 mmol/L 138 137 140  Potassium 3.5 - 5.1 mmol/L 3.3(L) 3.4(L) 3.9  Chloride 98 - 111  mmol/L 99 102 109  CO2 22 - 32 mmol/L 28 25 20(L)  Calcium 8.9 - 10.3 mg/dL 8.0(L) 8.1(L) 8.5(L)   Assessment/Plan:  Kayla [name] is a 80 y/o F with PMHx significant for CAD s/p PCI, CKD stage 3b, T2DM, HTN, HLD, previous CVA, recent COVID-19 infection (dx 10/30) who was admitted for bradycardia and found to have complete AV block. She has since developed a significant AKI and nephrology has been consulted.    Non-Oliguric Pre-renal AKI on CKD Stage 3b: Improving with hydration and holding ACE-I. Creatinine 2.81>2.56>2.19. Likely multifactorial in the setting of dehydration with recent COVID infection, complete heart block, bradycardia. UOP 2.4L. Renal US without obstruction. UA without Hgb or protein, only rare bacteria and >500 glucose. Urine protein creatinine ratio 0.31. Monitor strict I/O, daily weights. Avoid nephrotoxic agents as possible. No indication for dialysis at this time.  Avoid phosphorous containing bowel preps (FLEETS), NSAIDs, and IV contrast.  Non-Anion Gap Metabolic Acidosis: Resolved. Bicarb 14>28. Can switch to LR. Hypokalemia: Previously hyper, now hypo at 3.3. Repleted with 20 mEq Uremia: Improving. BUN 55>39 Complete Heart Block s/p temporary pacemaker: Emergent temporary pacemaker placement per cardiology on 11/14. Plans for possible PPM later in the week.   Recent COVID infection: Diagnosed 10/30, has continued cough. On room air, afebrile. CXR 11/13 without signs of pneumonia. HTN: Home Lisinopril-HCTZ held. On imdur and hydralazine. Normotensive. Continue to hold Lisinopril. Type  2 DM: Hgb A1c 8.7%. CBG, on SSI. Per primary. HLD  CAD  Hx of CVA: Continue statin, plavix, ASA. History of seizures: on Keppra, per primary. Deconditioning: Would recommend PT/OT prior to d/c.   Kayla Settler, DO 05/05/2021 8:19 AM  I have seen and examined this patient and agree with plan and assessment in the above note with renal recommendations/intervention highlighted.  She  continues to improve with IVF's.  Will stop isotonic bicarb and follow with LR.  May need to change to NS if CO2 continues to climb.  Continue to hold ACE inhibitor for now and follow I's/o's and daily Scr.  Kayla John A Ardel Jagger,MD 05/05/2021 12:01 PM

## 2021-05-05 NOTE — Progress Notes (Addendum)
NAME:  Kayla [name], MRN:  [id number], DOB:  13-Feb-1941, LOS: 1 ADMISSION DATE:  05/03/2021, CONSULTATION DATE:  05/04/21 REFERRING MD:  Beryl Meager, CHIEF COMPLAINT:  CHB   History of Present Illness:  80 y/o F with a history of CKD 3, DM, previous stroke, CAD, recently diagnosed second-degree heart block with 2-1 conduction that improved with discontinuation of beta-blocker during recent hospitalization 11/9-11/11 who presented 11/13 after becoming lethargic and confused.  She had a normal day on 11/12.  Yesterday when family checked her heart rate at home it was in the 30s.  She returned to the ER in complete heart block with AKI and hyperkalemia.  Overnight PCCM was consulted and noted that cardiac conduction improved with treatment of hyperkalemia.  Unfortunately overnight she developed progressive acidosis and heart block and underwent emergent TVP placement and was transferred to the ICU.  PCCM assuming primary care.    Pertinent  Medical History  CAD w/ DES in 2016 DM CKD III CVA 2nd degree HB Breast cancer Recent covid infection  Significant Hospital Events: Including procedures, antibiotic start and stop dates in addition to other pertinent events   11/13 Admit with severe bradycardia, hyperkalemia 11/14 TVP placement.  Renal US right renal cyst, benign.  No hydronephrosis.  Interim History / Subjective:  Pt denies complaints - SOB, chest pain, n/v/d I/O 2.4L UOP, net neg 150ml in last 24 hours  Glucose range 138-186 Afebrile / WBC 6   Objective   Blood pressure 138/73, pulse 87, temperature 98.3 F (36.8 C), temperature source Oral, resp. rate (!) 24, weight 81.1 kg, SpO2 97 %.        Intake/Output Summary (Last 24 hours) at 05/05/2021 0834 Last data filed at 05/05/2021 0630 Gross per 24 hour  Intake 2362.5 ml  Output 2475 ml  Net -112.5 ml   Filed Weights   05/05/21 0600  Weight: 81.1 kg    Examination: General: elderly female lying in bed in NAD, son at  bedside  HEENT: MM pink/moist, anicteric, R IJ approach temp pacing wire in place, dressing clean /dry Neuro: Awakens to voice, oriented to self/situational conversation, speech clear, MAE CV: s1s2 RRR, rate in 80's, occasional PVC's, no m/r/g PULM: non-labored at rest, lungs bilaterally with good air entry GI: soft, bsx4 active, tolerating PO's  Extremities: warm/dry, trace pedal edema  Skin: no rashes or lesions  Resolved Hospital Problem list     Assessment & Plan:   Cardiogenic shock, bradycardia due to CHB Hx of 2:1 AV block that had improved after Bblocker discontinuation, now progressed to CHB. TSH WNL -monitor in ICU with temporary pacer, tentative plan for PPM on 11/17  -appreciate EP / Cardiology evaluation  -tele monitoring   History of CAD, HTN -continue statin, imdur, ASA -hold beta blocker with CHB -continue hydralazine, imdur -avoid AV nodal blocking agents   AGMA, suspect lactic acidosis AKI with hyperkalemia Likely due to shock and ACE inhibitor use -continue TVP to ensure perfusion  -follow I/O's  -hold home ACE, HCTZ -Trend BMP / urinary output -Replace electrolytes as indicated -Avoid nephrotoxic agents, ensure adequate renal perfusion -change IVF to LR   Acute metabolic encephalopathy due to poor perfusion, renal failure -minimize all sedating medications  -promote sleep / wake cycle -delirium prevention measures   Recent COVID infection, ongoing cough Positive 10/30, out of window for isolation -continue dulera BID, PRN albuterol  -mucinex BID  -follow up CXR in am with moist cough  DM Uncontrolled, A1c 8.7 -SSI, sensitive  scale  -goal glucose 140-180 while in ICU  -hold home glipizide and jardiance   Hyperbilirubinemia Likely related to heart block -follow   H/o CVA, seizures -keppra   Best Practice (right click and "Reselect all SmartList Selections" daily)  Diet/type: Regular consistency (see orders) Renal diet, carb  modified DVT prophylaxis: prophylactic heparin  GI prophylaxis: N/A Lines: Central line and yes and it is still needed Foley:  N/A Code Status:  full code Last date of multidisciplinary goals of care discussion: full code.  Son updated at bedside 11/15.  Critical care time: 31 minutes     Noe Gens, MSN, APRN, NP-C, AGACNP-BC [facility] 05/05/2021, 8:34 AM   Please see [email or web address] for pager details.   From 7A-7P if no response, please call [phone] After hours, please call ELink [phone]

## 2021-05-05 NOTE — Progress Notes (Signed)
[facility] Progress Note Patient Name: Kayla [name] DOB: 09-Mar-1941 MRN: [id number]   Date of Service  05/05/2021  HPI/Events of Note  K+ 3.3, GFR 22  eICU Interventions  KCL 20 meq po x 1.        Kerry Kass Zelene Barga 05/05/2021, 5:51 AM

## 2021-05-05 NOTE — Evaluation (Signed)
Physical Therapy Evaluation Patient Details Name: Kayla [name] MRN: [id number] DOB: 03/07/1941 Today's Date: 05/05/2021  History of Present Illness  Pt adm 11/13 with 3rd degree heart block requiring temporary pacer with plans for permanent pacer tentatively on 11/17. Pt with recent adm 11/9-11/10 for heart block and with recent Covid (10/30). PMH - CAD s/p stent, DM, CKD, CVA, seizures  Clinical Impression  Pt admitted with above diagnosis and presents to PT with functional limitations due to deficits listed below (See PT problem list). Pt needs skilled PT to maximize independence and safety to allow discharge to home with family if they can provide assist pt may need.         Recommendations for follow up therapy are one component of a multi-disciplinary discharge planning process, led by the attending physician.  Recommendations may be updated based on patient status, additional functional criteria and insurance authorization.  Follow Up Recommendations Home health PT    Assistance Recommended at Discharge Frequent or constant Supervision/Assistance  Functional Status Assessment Patient has had a recent decline in their functional status and demonstrates the ability to make significant improvements in function in a reasonable and predictable amount of time.  Equipment Recommendations  None recommended by PT    Recommendations for Other Services       Precautions / Restrictions Precautions Precautions: Fall;Other (comment) Precaution Comments: temporary pacer      Mobility  Bed Mobility Overal bed mobility: Needs Assistance Bed Mobility: Supine to Sit     Supine to sit: Max assist     General bed mobility comments: Assist to bring legs off of bed, elevate trunk into sitting and bring hips to EOB.    Transfers Overall transfer level: Needs assistance Equipment used: Rolling walker (2 wheels) Transfers: Sit to/from Stand;Bed to chair/wheelchair/BSC Sit to Stand: +2  physical assistance;Min assist   Step pivot transfers: +2 physical assistance;Min assist       General transfer comment: Assist to bring hips up and for balance. Very small, shuffling steps bed to chair with walker    Ambulation/Gait               General Gait Details: Limited to bed to chair step pivot  Stairs            Wheelchair Mobility    Modified Rankin (Stroke Patients Only)       Balance Overall balance assessment: Needs assistance Sitting-balance support: No upper extremity supported;Feet supported Sitting balance-Leahy Scale: Fair     Standing balance support: Bilateral upper extremity supported;Reliant on assistive device for balance Standing balance-Leahy Scale: Poor Standing balance comment: walker and min assist for static standing                             Pertinent Vitals/Pain Pain Assessment: Faces Faces Pain Scale: Hurts a little bit Pain Location: feet Pain Descriptors / Indicators: Aching Pain Intervention(s): Monitored during session    Home Living Family/patient expects to be discharged to:: Private residence Living Arrangements: Spouse/significant other Available Help at Discharge: Family;Available 24 hours/day Type of Home: House Home Access: Stairs to enter Entrance Stairs-Rails: Right;Left;Can reach both Entrance Stairs-Number of Steps: 2-3   Home Layout: One level Home Equipment: Conservation officer, nature (2 wheels);Shower seat;Toilet riser;Cane - single point      Prior Function Prior Level of Function : Independent/Modified Independent             Mobility Comments: Per pt she  is ambulatory with cane       Hand Dominance        Extremity/Trunk Assessment   Upper Extremity Assessment Upper Extremity Assessment: Defer to OT evaluation    Lower Extremity Assessment Lower Extremity Assessment: Generalized weakness       Communication   Communication: HOH  Cognition Arousal/Alertness:  Awake/alert Behavior During Therapy: WFL for tasks assessed/performed Overall Cognitive Status: No family/caregiver present to determine baseline cognitive functioning Area of Impairment: Following commands;Problem solving;Safety/judgement;Awareness                       Following Commands: Follows one step commands with increased time Safety/Judgement: Decreased awareness of deficits Awareness: Intellectual Problem Solving: Slow processing;Requires verbal cues;Requires tactile cues          General Comments General comments (skin integrity, edema, etc.): VSS on RA    Exercises     Assessment/Plan    PT Assessment Patient needs continued PT services  PT Problem List Decreased strength;Decreased balance;Decreased cognition;Decreased range of motion;Decreased mobility;Decreased activity tolerance;Pain       PT Treatment Interventions DME instruction;Functional mobility training;Balance training;Patient/family education;Gait training;Therapeutic activities;Stair training;Therapeutic exercise    PT Goals (Current goals can be found in the Care Plan section)  Acute Rehab PT Goals Patient Stated Goal: return home PT Goal Formulation: With patient Time For Goal Achievement: 05/19/21 Potential to Achieve Goals: Fair    Frequency Min 3X/week   Barriers to discharge        Co-evaluation               AM-PAC PT "6 Clicks" Mobility  Outcome Measure Help needed turning from your back to your side while in a flat bed without using bedrails?: A Lot Help needed moving from lying on your back to sitting on the side of a flat bed without using bedrails?: A Lot Help needed moving to and from a bed to a chair (including a wheelchair)?: Total Help needed standing up from a chair using your arms (e.g., wheelchair or bedside chair)?: Total Help needed to walk in hospital room?: Total Help needed climbing 3-5 steps with a railing? : Total 6 Click Score: 8    End of  Session Equipment Utilized During Treatment: Gait belt Activity Tolerance: Patient limited by fatigue;Patient limited by pain Patient left: in chair;with call bell/phone within reach;with family/visitor present;with chair alarm set Nurse Communication: Mobility status (nurse present and assisting) PT Visit Diagnosis: Unsteadiness on feet (R26.81);Other abnormalities of gait and mobility (R26.89);Muscle weakness (generalized) (M62.81)    Time: 4259-5638 PT Time Calculation (min) (ACUTE ONLY): 25 min   Charges:   PT Evaluation $PT Eval Moderate Complexity: 1 Mod PT Treatments $Therapeutic Activity: 8-22 mins        [place] Pager [phone] Office [place] 05/05/2021, 5:30 PM

## 2021-05-05 NOTE — Progress Notes (Signed)
Electrophysiology Rounding Note  Patient Name: Kayla [name] Date of Encounter: 05/05/2021  Primary Cardiologist: Fransico Him, MD Electrophysiologist: Dr Rayann Heman -> Dr. Quentin Ore   Subjective   The patient is doing well today. She is much more alert and conversive. She understands she may need a pacemaker later in the week. At this time, the patient denies chest pain, shortness of breath, or any new concerns.  Inpatient Medications    Scheduled Meds:  aspirin EC  81 mg Oral Daily   atorvastatin  20 mg Oral QHS   Chlorhexidine Gluconate Cloth  6 each Topical Daily   guaiFENesin  600 mg Oral BID   heparin  5,000 Units Subcutaneous Q8H   hydrALAZINE  50 mg Oral BID   insulin aspart  0-9 Units Subcutaneous TID WC   isosorbide mononitrate  30 mg Oral Daily   levETIRAcetam  500 mg Oral QHS   mometasone-formoterol  2 puff Inhalation BID   vitamin B-12  2,500 mcg Oral Daily   Continuous Infusions:  sodium bicarbonate 150 mEq in D5W infusion 100 mL/hr at 05/05/21 0649   PRN Meds: albuterol, nitroGLYCERIN   Vital Signs    Vitals:   05/05/21 0400 05/05/21 0500 05/05/21 0600 05/05/21 0635  BP: 128/74 140/76 112/65   Pulse: 88 93 92   Resp: (!) 21 16 (!) 24   Temp:    98.3 F (36.8 C)  TempSrc:    Oral  SpO2: 98% 96% 96%   Weight:   81.1 kg     Intake/Output Summary (Last 24 hours) at 05/05/2021 0728 Last data filed at 05/05/2021 0630 Gross per 24 hour  Intake 2362.5 ml  Output 2475 ml  Net -112.5 ml   Filed Weights   05/05/21 0600  Weight: 81.1 kg    Physical Exam    GEN- The patient is elderly appearing, alert and oriented x 3 today.   Head- normocephalic, atraumatic Eyes-  Sclera clear, conjunctiva pink Ears- hearing intact Oropharynx- clear Neck- supple Lungs- Clear to ausculation bilaterally, normal work of breathing Heart- Regular rate and rhythm, no murmurs, rubs or gallops GI- soft, NT, ND, + BS Extremities- no clubbing or cyanosis. No edema Skin-  no rash or lesion Psych- euthymic mood, full affect Neuro- strength and sensation are intact  Labs    CBC Recent Labs    05/03/21 2035 05/04/21 0353  WBC 6.8 6.0  NEUTROABS  --  3.4  HGB 15.9* 14.7  HCT 50.5* 46.7*  MCV 93.0 91.6  PLT 253 709   Basic Metabolic Panel Recent Labs    05/03/21 2035 05/04/21 0353 05/04/21 1202 05/04/21 2246 05/05/21 0430  NA 139 139   < > 137 138  K 5.5* 5.3*   < > 3.4* 3.3*  CL 109 111   < > 102 99  CO2 17* 14*   < > 25 28  GLUCOSE 152* 138*   < > 185* 186*  BUN 55* 55*   < > 47* 39*  CREATININE 3.72* 3.51*   < > 2.56* 2.19*  CALCIUM 9.1 8.7*   < > 8.1* 8.0*  MG 2.4 2.5*  --   --   --   PHOS  --   --    < > 3.5 3.2   < > = values in this interval not displayed.   Liver Function Tests Recent Labs    05/04/21 0353 05/04/21 1202 05/04/21 2246 05/05/21 0430  AST 30  --   --   --  ALT 18  --   --   --   ALKPHOS 59  --   --   --   BILITOT 1.4*  --   --   --   PROT 6.4*  --   --   --   ALBUMIN 3.0*   < > 2.7* 2.6*   < > = values in this interval not displayed.   No results for input(s): LIPASE, AMYLASE in the last 72 hours. Cardiac Enzymes No results for input(s): CKTOTAL, CKMB, CKMBINDEX, TROPONINI in the last 72 hours.   Telemetry    NSR 80s, occasionally dropping down and pacing in 60s. (personally reviewed)  Radiology    CARDIAC CATHETERIZATION  Result Date: 05/04/2021 Successful transvenous pacemaker placement via right internal jugular vein access.  Further recommendations per electrophysiology team.   US RENAL  Result Date: 05/04/2021 CLINICAL DATA:  Acute kidney injury. EXAM: RENAL / URINARY TRACT ULTRASOUND COMPLETE COMPARISON:  03/07/2009 FINDINGS: Right Kidney: Renal measurements: 9.0 x 4.3 x 4.9 centimeters = volume: 99.3 mL. RIGHT UPPER pole cyst is 3.1 centimeters. Second cyst is 1.8 centimeters. No hydronephrosis. Left Kidney: Renal measurements: 8.1 x 4.3 x 4.6 centimeters = volume: 82.7 mL. Echogenicity  within normal limits. No mass or hydronephrosis visualized. Bladder: Foley catheter decompresses the urinary bladder. Other: None. IMPRESSION: 1. Benign RIGHT renal cyst. 2. No hydronephrosis. Electronically Signed   By: Nolon Nations M.D.   On: 05/04/2021 11:00   DG Chest Portable 1 View  Result Date: 05/03/2021 CLINICAL DATA:  Bradycardia EXAM: PORTABLE CHEST 1 VIEW COMPARISON:  04/29/2021 FINDINGS: Lungs are symmetrically expanded and are clear. No pneumothorax or pleural effusion. Cardiac size within normal limits. Pulmonary vascularity is normal. Osseous structures are age-appropriate. No acute bone abnormality. IMPRESSION: No active disease. Electronically Signed   By: Fidela Salisbury M.D.   On: 05/03/2021 20:30    Patient Profile     Kayla [name] is a 80 y.o. female with a history of CAD s/p PCI, poorly controlled DM2, CKD III, Breast CA, GERD, Sickle cell trait, h/o CVA, recent COVID infection (04/19/21) and ? Medical non compliance who is being seen today for the evaluation of bradycardia at the request of Dr. Bonner Puna.  Assessment & Plan    Intermittent CHB This am she is NSR in the 80s. She continues to intermittently drop her rhythm and pace Continue to follow. Possible PPM later in the week, most likely Thursday given census.   2. AKI Likely in setting of CHB CR 2.2 this am  3. Hypokalemia K 3.3. Given 20 meq supp with Cr 2.2. Likely needs more.    4. Uremia/Encephalopathy She is much more clear this am.    5. HTN Lisinopril and HCTZ held due to acute renal failure and hyperkalemia Systolic in 027X this am   6. Recent COVID infection Home test + 04/19/21. She is out of contact precaution window.    7. H/o SVT Previously identified on loop recorder (RRT 2019)  For questions or updates, please contact [place] Please consult [email or web address] for contact info under Cardiology/STEMI.  Signed, Shirley Friar, PA-C  05/05/2021, 7:28 AM

## 2021-05-06 ENCOUNTER — Inpatient Hospital Stay (HOSPITAL_COMMUNITY): Payer: Medicare (Managed Care)

## 2021-05-06 LAB — BASIC METABOLIC PANEL
Anion gap: 10 (ref 5–15)
BUN: 27 mg/dL — ABNORMAL HIGH (ref 8–23)
CO2: 27 mmol/L (ref 22–32)
Calcium: 7.9 mg/dL — ABNORMAL LOW (ref 8.9–10.3)
Chloride: 102 mmol/L (ref 98–111)
Creatinine, Ser: 1.79 mg/dL — ABNORMAL HIGH (ref 0.44–1.00)
GFR, Estimated: 28 mL/min — ABNORMAL LOW (ref >60)
Glucose, Bld: 126 mg/dL — ABNORMAL HIGH (ref 70–99)
Potassium: 3.2 mmol/L — ABNORMAL LOW (ref 3.5–5.1)
Sodium: 139 mmol/L (ref 135–145)

## 2021-05-06 LAB — GLUCOSE, CAPILLARY
Glucose-Capillary: 156 mg/dL — ABNORMAL HIGH (ref 70–99)
Glucose-Capillary: 197 mg/dL — ABNORMAL HIGH (ref 70–99)
Glucose-Capillary: 191 mg/dL — ABNORMAL HIGH (ref 70–99)
Glucose-Capillary: 123 mg/dL — ABNORMAL HIGH (ref 70–99)

## 2021-05-06 LAB — CBC
HCT: 38.9 % (ref 36.0–46.0)
Hemoglobin: 12.7 g/dL (ref 12.0–15.0)
MCH: 29.1 pg (ref 26.0–34.0)
MCHC: 32.6 g/dL (ref 30.0–36.0)
MCV: 89.2 fL (ref 80.0–100.0)
Platelets: 199 10*3/uL (ref 150–400)
RBC: 4.36 MIL/uL (ref 3.87–5.11)
RDW: 16.8 % — ABNORMAL HIGH (ref 11.5–15.5)
WBC: 6.4 10*3/uL (ref 4.0–10.5)
nRBC: 0 % (ref 0.0–0.2)

## 2021-05-06 LAB — SURGICAL PCR SCREEN
MRSA, PCR: NEGATIVE
Staphylococcus aureus: NEGATIVE

## 2021-05-06 LAB — URIC ACID: Uric Acid, Serum: 6.1 mg/dL (ref 2.5–7.1)

## 2021-05-06 MED ORDER — POTASSIUM CHLORIDE CRYS ER 20 MEQ PO TBCR
40.0000 meq | EXTENDED_RELEASE_TABLET | Freq: Once | ORAL | Status: AC
  Administered 2021-05-06: 40 meq via ORAL
  Filled 2021-05-06: qty 2

## 2021-05-06 MED ORDER — SODIUM CHLORIDE 0.9 % IV SOLN: INTRAVENOUS | Status: DC

## 2021-05-06 MED ORDER — SODIUM CHLORIDE 0.9 % IV SOLN
80.0000 mg | INTRAVENOUS | Status: DC
  Filled 2021-05-06: qty 2

## 2021-05-06 MED ORDER — SODIUM CHLORIDE 0.9 % IV SOLN
80.0000 mg | INTRAVENOUS | Status: DC
  Filled 2021-05-06 (×2): qty 2

## 2021-05-06 MED ORDER — ENSURE ENLIVE PO LIQD
237.0000 mL | Freq: Three times a day (TID) | ORAL | Status: DC
  Administered 2021-05-06 – 2021-05-11 (×10): 237 mL via ORAL

## 2021-05-06 MED ORDER — CHLORHEXIDINE GLUCONATE 4 % EX LIQD: 60.0000 mL | Freq: Once | CUTANEOUS | Status: DC

## 2021-05-06 MED ORDER — VANCOMYCIN HCL 1000 MG/200ML IV SOLN
1000.0000 mg | INTRAVENOUS | Status: AC
  Administered 2021-05-07: 16:00:00 1000 mg via INTRAVENOUS
  Filled 2021-05-06: qty 200

## 2021-05-06 MED ORDER — CHLORHEXIDINE GLUCONATE 4 % EX LIQD
60.0000 mL | Freq: Once | CUTANEOUS | Status: DC
  Filled 2021-05-06: qty 60

## 2021-05-06 MED ORDER — ADULT MULTIVITAMIN W/MINERALS CH
1.0000 | ORAL_TABLET | Freq: Every day | ORAL | Status: DC
  Administered 2021-05-07 – 2021-05-11 (×5): 1 via ORAL
  Filled 2021-05-06 (×5): qty 1

## 2021-05-06 MED ORDER — CHLORHEXIDINE GLUCONATE 4 % EX LIQD
60.0000 mL | Freq: Once | CUTANEOUS | Status: AC
  Administered 2021-05-07: 4 via TOPICAL

## 2021-05-06 MED ORDER — VANCOMYCIN HCL 1000 MG/200ML IV SOLN: 1000.0000 mg | INTRAVENOUS | Status: DC

## 2021-05-06 NOTE — Progress Notes (Signed)
Initial Nutrition Assessment  DOCUMENTATION CODES:  Non-severe (moderate) malnutrition in context of chronic illness  INTERVENTION:  Advance diet as medically able and as tolerated. Recommend Carb Modified diet restriction only.  Add Ensure Enlive po BID, each supplement provides 350 kcal and 20 grams of protein.  Add Ensure Max po daily, each supplement provides 150 kcal and 30 grams of protein.    Add MVI with minerals daily.  Encourage PO and supplement intake.  NUTRITION DIAGNOSIS:  Moderate Malnutrition related to chronic illness (uncontrolled T2DM, CKD3) as evidenced by mild fat depletion, moderate muscle depletion.  GOAL:  Patient will meet greater than or equal to 90% of their needs  MONITOR:  PO intake, Supplement acceptance, Labs, Weight trends, I & O's  REASON FOR ASSESSMENT:  Malnutrition Screening Tool    ASSESSMENT:  80 yo female with a PMH of CAD s/p PCI, poorly controlled DM2, CKD III, Breast CA, GERD, Sickle cell trait, h/o CVA, recent COVID infection (04/19/21) and ? Medical non compliance who is being seen for the evaluation of bradycardia. 11/14 - temporary pacemaker placed  Planning for dual chamber PPM with left bundle area pacemaker lead (Biotronik) placement today.  Spoke with pt and son at bedside. Pt reports that her appetite has been poor for the last two weeks. Son endorses this. He reports that pt has been eating some while she's been here.  Per Epic, pt hast eaten an average of ~67% over the past 5 documented meals (20-90%). Pt currently NPO.  Pt reports no changes in her weight. Per Epic, it appears weight has trended back up since last admission just last week. No edema is noted.  Recommend adding Ensure TID and MVI with minerals daily once diet is advanced.  Supplements: Nepro shakes BID  Medications: reviewed;  SSI, Keppra, Klor-Con 40 mEq once, Vitamin B12, LR @ 40 ml/hr  Labs: reviewed; K 3.2 (L), CBG 117-204 (H) HbA1c: 8.7%  (04/30/2021)  NUTRITION - FOCUSED PHYSICAL EXAM: Flowsheet Row Most Recent Value  Orbital Region Mild depletion  Upper Arm Region Mild depletion  Thoracic and Lumbar Region Mild depletion  Buccal Region No depletion  Temple Region Moderate depletion  Clavicle Bone Region No depletion  Clavicle and Acromion Bone Region No depletion  Scapular Bone Region No depletion  Dorsal Hand Mild depletion  Patellar Region Moderate depletion  Anterior Thigh Region Moderate depletion  Posterior Calf Region Moderate depletion  Edema (RD Assessment) None  Hair Reviewed  Eyes Reviewed  Mouth Reviewed  Skin Reviewed  Nails Reviewed   Diet Order:   Diet Order             Diet NPO time specified  Diet effective 0500           Diet clear liquid Room service appropriate? Yes; Fluid consistency: Thin  Diet effective midnight           Diet renal/carb modified with fluid restriction Diet-HS Snack? Nothing; Room service appropriate? Yes; Fluid consistency: Thin  Diet effective now                  EDUCATION NEEDS:  Education needs have been addressed  Skin:  Skin Assessment: Reviewed RN Assessment  Last BM:  PTA  Height:  Ht Readings from Last 1 Encounters:  05/05/21 5' 6.5" (1.689 m)   Weight:  Wt Readings from Last 1 Encounters:  05/06/21 82.6 kg   BMI:  Body mass index is 28.95 kg/m.  Estimated Nutritional Needs:  Kcal:  1700-1900 Protein:  90-105 grams Fluid:  >1.7 L  Derrel Nip, RD, LDN (she/her/hers) Clinical Inpatient Dietitian RD Pager/After-Hours/Weekend Pager # in [place]

## 2021-05-06 NOTE — Progress Notes (Signed)
Patient ID: Kayla [name], female   DOB: January 19, 1941, 80 y.o.   MRN: [id number] S: No events overnight.  Feeling better, son at bedside. O:BP 137/78   Pulse 99   Temp 99.3 F (37.4 C) (Oral)   Resp (!) 21   Ht 5' 6.5" (1.689 m)   Wt 82.6 kg   SpO2 94%   BMI 28.95 kg/m   Intake/Output Summary (Last 24 hours) at 05/06/2021 1004 Last data filed at 05/06/2021 0900 Gross per 24 hour  Intake 936.12 ml  Output 2050 ml  Net -1113.88 ml   Intake/Output: I/O last 3 completed shifts: In: 2292.9 [I.V.:2292.9] Out: 6144 [Urine:3150]  Intake/Output this shift:  Total I/O In: 94.3 [I.V.:94.3] Out: 250 [Urine:250] Weight change: 0 kg Gen: NAD, slowed mentation CVS: RRR Resp:occ rhonchi Abd:+BS, soft, NT/ND Ext: no edema  Recent Labs  Lab 04/29/21 1437 04/29/21 1542 05/01/21 0159 05/03/21 2035 05/04/21 0353 05/04/21 1202 05/04/21 2246 05/05/21 0430 05/06/21 0043  NA 139   < > 135 139 139 140 137 138 139  K 4.6   < > 4.0 5.5* 5.3* 3.9 3.4* 3.3* 3.2*  CL 108   < > 105 109 111 109 102 99 102  CO2 20*   < > 20* 17* 14* 20* 25 28 27   GLUCOSE 142*   < > 155* 152* 138* 188* 185* 186* 126*  BUN 49*   < > 37* 55* 55* 47* 47* 39* 27*  CREATININE 1.98*   < > 1.56* 3.72* 3.51* 2.81* 2.56* 2.19* 1.79*  ALBUMIN 3.2*  --   --   --  3.0* 2.8* 2.7* 2.6*  --   CALCIUM 8.7*   < > 8.5* 9.1 8.7* 8.5* 8.1* 8.0* 7.9*  PHOS  --   --   --   --   --  4.2 3.5 3.2  --   AST 14*  --   --   --  30  --   --   --   --   ALT 11  --   --   --  18  --   --   --   --    < > = values in this interval not displayed.   Liver Function Tests: Recent Labs  Lab 04/29/21 1437 05/04/21 0353 05/04/21 1202 05/04/21 2246 05/05/21 0430  AST 14* 30  --   --   --   ALT 11 18  --   --   --   ALKPHOS 86 59  --   --   --   BILITOT 0.9 1.4*  --   --   --   PROT 7.2 6.4*  --   --   --   ALBUMIN 3.2* 3.0* 2.8* 2.7* 2.6*   No results for input(s): LIPASE, AMYLASE in the last 168 hours. No results for input(s):  AMMONIA in the last 168 hours. CBC: Recent Labs  Lab 04/29/21 1437 04/29/21 1542 05/03/21 2035 05/04/21 0353 05/06/21 0043  WBC 5.0  --  6.8 6.0 6.4  NEUTROABS 3.3  --   --  3.4  --   HGB 15.0   < > 15.9* 14.7 12.7  HCT 47.2*   < > 50.5* 46.7* 38.9  MCV 91.5  --  93.0 91.6 89.2  PLT 226  --  253 234 199   < > = values in this interval not displayed.   Cardiac Enzymes: No results for input(s): CKTOTAL, CKMB, CKMBINDEX, TROPONINI in the last 168 hours. CBG:  Recent Labs  Lab 05/05/21 0633 05/05/21 1132 05/05/21 1654 05/05/21 2153 05/06/21 0701  GLUCAP 204* 170* 117* 164* 156*    Iron Studies: No results for input(s): IRON, TIBC, TRANSFERRIN, FERRITIN in the last 72 hours. Studies/Results: US RENAL  Result Date: 05/04/2021 CLINICAL DATA:  Acute kidney injury. EXAM: RENAL / URINARY TRACT ULTRASOUND COMPLETE COMPARISON:  03/07/2009 FINDINGS: Right Kidney: Renal measurements: 9.0 x 4.3 x 4.9 centimeters = volume: 99.3 mL. RIGHT UPPER pole cyst is 3.1 centimeters. Second cyst is 1.8 centimeters. No hydronephrosis. Left Kidney: Renal measurements: 8.1 x 4.3 x 4.6 centimeters = volume: 82.7 mL. Echogenicity within normal limits. No mass or hydronephrosis visualized. Bladder: Foley catheter decompresses the urinary bladder. Other: None. IMPRESSION: 1. Benign RIGHT renal cyst. 2. No hydronephrosis. Electronically Signed   By: Nolon Nations M.D.   On: 05/04/2021 11:00   DG CHEST PORT 1 VIEW  Result Date: 05/06/2021 CLINICAL DATA:  Cough, bradycardia EXAM: PORTABLE CHEST 1 VIEW COMPARISON:  Previous studies including the examination of 05/03/2021 FINDINGS: Transverse diameter of heart is increased. Thoracic aorta is tortuous and ectatic. There is poor inspiration. Increased interstitial markings are seen in the lower lung fields. There are no signs of alveolar pulmonary edema. There is no significant pleural effusion or pneumothorax. IMPRESSION: Increased interstitial markings in the  lower lung fields may suggest crowding of bronchovascular structures due to poor inspiration or interstitial pneumonitis. Electronically Signed   By: Elmer Picker M.D.   On: 05/06/2021 08:15    aspirin EC  81 mg Oral Daily   atorvastatin  20 mg Oral QHS   Chlorhexidine Gluconate Cloth  6 each Topical Daily   feeding supplement (NEPRO CARB STEADY)  237 mL Oral BID BM   guaiFENesin  600 mg Oral BID   heparin  5,000 Units Subcutaneous Q8H   hydrALAZINE  50 mg Oral BID   insulin aspart  0-9 Units Subcutaneous TID WC   isosorbide mononitrate  30 mg Oral Daily   levETIRAcetam  500 mg Oral QHS   mometasone-formoterol  2 puff Inhalation BID   vitamin B-12  2,500 mcg Oral Daily    BMET    Component Value Date/Time   NA 139 05/06/2021 0043   K 3.2 (L) 05/06/2021 0043   CL 102 05/06/2021 0043   CO2 27 05/06/2021 0043   GLUCOSE 126 (H) 05/06/2021 0043   BUN 27 (H) 05/06/2021 0043   CREATININE 1.79 (H) 05/06/2021 0043   CALCIUM 7.9 (L) 05/06/2021 0043   GFRNONAA 28 (L) 05/06/2021 0043   GFRAA 48 (L) 06/24/2016 0709   CBC    Component Value Date/Time   WBC 6.4 05/06/2021 0043   RBC 4.36 05/06/2021 0043   HGB 12.7 05/06/2021 0043   HCT 38.9 05/06/2021 0043   PLT 199 05/06/2021 0043   MCV 89.2 05/06/2021 0043   MCH 29.1 05/06/2021 0043   MCHC 32.6 05/06/2021 0043   RDW 16.8 (H) 05/06/2021 0043   LYMPHSABS 1.3 05/04/2021 0353   MONOABS 0.9 05/04/2021 0353   EOSABS 0.2 05/04/2021 0353   BASOSABS 0.1 05/04/2021 0353    Assessment/Plan:  Kayla [name] is a 80 y/o F with PMHx significant for CAD s/p PCI, CKD stage 3b, T2DM, HTN, HLD, previous CVA, recent COVID-19 infection (dx 10/30) who was admitted for bradycardia and found to have complete AV block. She has since developed a significant AKI and nephrology has been consulted.    Non-Oliguric AKI on CKD Stage 3b: presumably due to volume  depletion with poor po intake further exacerbated by bradycardia and recent covid-19  infection and likely pre-renal/ischemic ATN.   Improving with hydration and holding ACE-I. Creatinine 2.81>2.56>2.19 to 1.79 and probably close to her baseline. Renal US without obstruction. UA without Hgb or protein, only rare bacteria and >500 glucose. Urine protein creatinine ratio 0.31.  Monitor strict I/O, daily weights.  She will need renal follow up after discharge. Avoid nephrotoxic agents as possible including phosphorous containing bowel preps (FLEETS), NSAIDs, and IV contrast.  Non-Anion Gap Metabolic Acidosis: Resolved. continue with LR for now. Hypokalemia: Previously hyper, now hypo at 3.3. Repleted with 40 mEq and continue to replete.  Uremia: Improving. BUN 55>27. Complete Heart Block s/p temporary pacemaker: Emergent temporary pacemaker placement per cardiology on 11/14. Plan for dual chamber PPM placement tomorrow per Cardiology. Recent COVID infection: Diagnosed 10/30, has continued cough. On room air, afebrile. CXR 11/13 without signs of pneumonia. HTN: Home Lisinopril-HCTZ held. On imdur and hydralazine. Normotensive. Continue to hold Lisinopril. Type 2 DM: Hgb A1c 8.7%. CBG, on SSI. Per primary. HLD  CAD  Hx of CVA: Continue statin, plavix, ASA. History of seizures: on Keppra, per primary. Deconditioning: Would recommend PT/OT prior to d/c.   Donetta Potts, MD [employer] [phone]

## 2021-05-06 NOTE — Care Management (Signed)
1420 05-06-21 Patient is currently active with [facility] with Physical Therapy, Occupational Therapy, and an Aide. Case Manager will continue to follow for additional needs.

## 2021-05-06 NOTE — Progress Notes (Signed)
Electrophysiology Rounding Note  Patient Name: Kayla [name] Date of Encounter: 05/06/2021  Primary Cardiologist: Fransico Him, MD Electrophysiologist: Dr Rayann Heman -> Dr. Quentin Ore   Subjective   NAEO. Doing well. Family at bedside.  Inpatient Medications    Scheduled Meds:  aspirin EC  81 mg Oral Daily   atorvastatin  20 mg Oral QHS   Chlorhexidine Gluconate Cloth  6 each Topical Daily   feeding supplement (NEPRO CARB STEADY)  237 mL Oral BID BM   guaiFENesin  600 mg Oral BID   heparin  5,000 Units Subcutaneous Q8H   hydrALAZINE  50 mg Oral BID   insulin aspart  0-9 Units Subcutaneous TID WC   isosorbide mononitrate  30 mg Oral Daily   levETIRAcetam  500 mg Oral QHS   mometasone-formoterol  2 puff Inhalation BID   vitamin B-12  2,500 mcg Oral Daily   Continuous Infusions:  lactated ringers 40 mL/hr at 05/06/21 0000   PRN Meds: acetaminophen, albuterol, guaiFENesin-dextromethorphan, nitroGLYCERIN   Vital Signs    Vitals:   05/06/21 0400 05/06/21 0500 05/06/21 0600 05/06/21 0700  BP: 133/84 137/78    Pulse: 100 98 (!) 101   Resp: (!) 24 (!) 24 (!) 22   Temp:    99.3 F (37.4 C)  TempSrc:    Oral  SpO2: 95% 93% 94%   Weight:  82.6 kg    Height:        Intake/Output Summary (Last 24 hours) at 05/06/2021 3532 Last data filed at 05/06/2021 0600 Gross per 24 hour  Intake 1357.03 ml  Output 1800 ml  Net -442.97 ml    Filed Weights   05/05/21 0600 05/05/21 1008 05/06/21 0500  Weight: 81.1 kg 81.1 kg 82.6 kg    Physical Exam    GEN- The patient is elderly appearing, alert and oriented x 3 today.   Head- normocephalic, atraumatic Eyes-  Sclera clear, conjunctiva pink Ears- hearing intact Oropharynx- clear Neck- supple Lungs- Clear to ausculation bilaterally, normal work of breathing Heart- Regular rate and rhythm, no murmurs, rubs or gallops GI- soft, NT, ND, + BS Extremities- no clubbing or cyanosis. No edema Skin- no rash or lesion Psych-  euthymic mood, full affect Neuro- strength and sensation are intact  Labs    CBC Recent Labs    05/04/21 0353 05/06/21 0043  WBC 6.0 6.4  NEUTROABS 3.4  --   HGB 14.7 12.7  HCT 46.7* 38.9  MCV 91.6 89.2  PLT 234 992    Basic Metabolic Panel Recent Labs    05/03/21 2035 05/04/21 0353 05/04/21 1202 05/04/21 2246 05/05/21 0430 05/06/21 0043  NA 139 139   < > 137 138 139  K 5.5* 5.3*   < > 3.4* 3.3* 3.2*  CL 109 111   < > 102 99 102  CO2 17* 14*   < > 25 28 27   GLUCOSE 152* 138*   < > 185* 186* 126*  BUN 55* 55*   < > 47* 39* 27*  CREATININE 3.72* 3.51*   < > 2.56* 2.19* 1.79*  CALCIUM 9.1 8.7*   < > 8.1* 8.0* 7.9*  MG 2.4 2.5*  --   --   --   --   PHOS  --   --    < > 3.5 3.2  --    < > = values in this interval not displayed.    Liver Function Tests Recent Labs    05/04/21 0353 05/04/21 1202 05/04/21 2246  05/05/21 0430  AST 30  --   --   --   ALT 18  --   --   --   ALKPHOS 59  --   --   --   BILITOT 1.4*  --   --   --   PROT 6.4*  --   --   --   ALBUMIN 3.0*   < > 2.7* 2.6*   < > = values in this interval not displayed.    No results for input(s): LIPASE, AMYLASE in the last 72 hours. Cardiac Enzymes No results for input(s): CKTOTAL, CKMB, CKMBINDEX, TROPONINI in the last 72 hours.   Telemetry    NSR 80s, occasionally dropping down and pacing in 60s. (personally reviewed)  Radiology    CARDIAC CATHETERIZATION  Result Date: 05/04/2021 Successful transvenous pacemaker placement via right internal jugular vein access.  Further recommendations per electrophysiology team.   US RENAL  Result Date: 05/04/2021 CLINICAL DATA:  Acute kidney injury. EXAM: RENAL / URINARY TRACT ULTRASOUND COMPLETE COMPARISON:  03/07/2009 FINDINGS: Right Kidney: Renal measurements: 9.0 x 4.3 x 4.9 centimeters = volume: 99.3 mL. RIGHT UPPER pole cyst is 3.1 centimeters. Second cyst is 1.8 centimeters. No hydronephrosis. Left Kidney: Renal measurements: 8.1 x 4.3 x 4.6  centimeters = volume: 82.7 mL. Echogenicity within normal limits. No mass or hydronephrosis visualized. Bladder: Foley catheter decompresses the urinary bladder. Other: None. IMPRESSION: 1. Benign RIGHT renal cyst. 2. No hydronephrosis. Electronically Signed   By: Nolon Nations M.D.   On: 05/04/2021 11:00    Patient Profile     Kayla [name] is a 80 y.o. female with a history of CAD s/p PCI, poorly controlled DM2, CKD III, Breast CA, GERD, Sickle cell trait, h/o CVA, recent COVID infection (04/19/21) and ? Medical non compliance who is being seen for the evaluation of bradycardia at the request of Dr. Bonner Puna.  Assessment & Plan    Intermittent CHB This am she is NSR in the 80s. She continues to intermittently drop her rhythm and pace Continue to follow.   Planning for Dual chamber PPM tomorrow with left bundle area pacemaker lead (Biotronik).  Procedure discussed with patient and family at the bedside.  Risks, benefits, alternatives to PPM implantation were discussed in detail with the patient today. The patient understands that the risks include but are not limited to bleeding, infection, pneumothorax, perforation, tamponade, vascular damage, renal failure, MI, stroke, death, and lead dislodgement and wishes to proceed.    Please keep NPO after MN.  2. AKI Improving. Likely 2/2 CHB.  3. Hypokalemia   4. Uremia/Encephalopathy Improving with pacing.   5. HTN Monitor off medications.   6. Recent COVID infection Home test + 04/19/21. She is out of contact precaution window.    7. H/o SVT Previously identified on loop recorder (RRT 2019)  For questions or updates, please contact [place] Please consult [email or web address] for contact info under Cardiology/STEMI.  Signed, Vickie Epley, MD  05/06/2021, 7:12 AM

## 2021-05-06 NOTE — Progress Notes (Signed)
RE: Kayla [name]. [place]   Date of Birth: 1941-01-20  Date: 05/06/2021  To Whom It May Concern:  Please be advised that the above-named patient will require a short-term nursing home stay - anticipated 30 days or less for rehabilitation and strengthening. The plan is for return home.

## 2021-05-06 NOTE — NC FL2 (Addendum)
[place] LEVEL OF CARE SCREENING TOOL     IDENTIFICATION  Patient Name: Kayla [name] Birthdate: [birth year] Sex: female Admission Date (Current Location): 05/03/2021  [facility] and [place] Number:  Herbalist and Address:  [facility] [place] [address] [place], [place] [address]      Provider Number: [id number]  Attending Physician Name and Address:  Julian Hy, DO  Relative Name and Phone Number:  [place] [phone]    Current Level of Care: Hospital Recommended Level of Care: [place] Prior Approval Number:    Date Approved/Denied:   PASRR Number: [id number] Discharge Plan: SNF    Current Diagnoses: Patient Active Problem List   Diagnosis Date Noted   Complete heart block ([place]) 05/04/2021   ARF (acute renal failure) ([place]) 05/04/2021   Bradycardia 05/03/2021   Malnutrition of moderate degree 04/30/2021   CKD (chronic kidney disease) stage 3, GFR 30-59 ml/min ([place]) 04/29/2021   COVID-19 virus infection 04/29/2021   AV block, 2nd degree 04/29/2021   2nd degree AV block    CAD (coronary artery disease)    Vascular dementia ([place]) 12/29/2018   Acute pain of both knees 04/09/2018   Routine general medical examination at a health care facility 03/31/2018   Drooling 08/27/2017   Seizure as late effect of cerebrovascular accident (CVA) ([place])    Blood in stool 02/26/2016   Decreased strength, endurance, and mobility 09/25/2015   Adjustment disorder with depressed mood 07/27/2015   Left homonymous hemianopsia 09/06/2014   Occipital infarction ([place]) 09/05/2014   Abnormal chest x-ray 08/22/2014   Type 2 diabetes mellitus with diabetic neuropathy ([place]) 01/04/2014   Hyperlipidemia associated with type 2 diabetes mellitus ([place]) 05/22/2009   Essential hypertension 05/04/2007   Osteoarthritis 05/04/2007   Fibromyalgia 05/04/2007    Orientation RESPIRATION BLADDER Height & Weight     Self, Time,  Situation, Place (WDL)  Normal Incontinent (Urethral catheter) Weight: 182 lb 1.6 oz (82.6 kg) Height:  5' 6.5" (168.9 cm)  BEHAVIORAL SYMPTOMS/MOOD NEUROLOGICAL BOWEL NUTRITION STATUS      Continent (WDL) Diet (Please see discharge summary)  AMBULATORY STATUS COMMUNICATION OF NEEDS Skin   Limited Assist Verbally Normal                       Personal Care Assistance Level of Assistance  Bathing, Feeding, Dressing Bathing Assistance: Limited assistance Feeding assistance: Limited assistance Dressing Assistance: Limited assistance     Functional Limitations Info  Sight, Hearing, Speech Sight Info: Impaired Hearing Info: Impaired Speech Info: Adequate    SPECIAL CARE FACTORS FREQUENCY  PT (By licensed PT), OT (By licensed OT)     PT Frequency: 5x min weekly OT Frequency: 5x min weekly            Contractures Contractures Info: Not present    Additional Factors Info  Code Status, Allergies, Insulin Sliding Scale Code Status Info: FULL Allergies Info: Amlodipine,Fish Allergy,Penicillins,Shellfish Allergy,Peanuts (peanut Oil),Tomato   Insulin Sliding Scale Info: insulin aspart (novoLOG) injection 0-9 Units 3 times daily with meals,       Current Medications (05/06/2021):  This is the current hospital active medication list Current Facility-Administered Medications  Medication Dose Route Frequency Provider Last Rate Last Admin   acetaminophen (TYLENOL) tablet 650 mg  650 mg Oral Q6H PRN Noemi Chapel P, DO   650 mg at 05/05/21 1245   albuterol (PROVENTIL) (2.5 MG/3ML) 0.083% nebulizer solution 2.5 mg  2.5 mg  Nebulization Q6H PRN Einar Grad [facility]       aspirin EC tablet 81 mg  81 mg Oral Daily Julian Hy, DO   81 mg at 05/06/21 8675   atorvastatin (LIPITOR) tablet 20 mg  20 mg Oral QHS Rise Patience, MD   20 mg at 05/05/21 2134   Chlorhexidine Gluconate Cloth 2 % PADS 6 each  6 each Topical Daily Patrecia Pour, MD   6 each at 05/06/21 1152   feeding  supplement (ENSURE ENLIVE / ENSURE PLUS) liquid 237 mL  237 mL Oral TID BM Julian Hy, DO       guaiFENesin (MUCINEX) 12 hr tablet 600 mg  600 mg Oral BID Noemi Chapel P, DO   600 mg at 05/06/21 4492   guaiFENesin-dextromethorphan (ROBITUSSIN DM) 100-10 MG/5ML syrup 5 mL  5 mL Oral Q4H PRN Noemi Chapel P, DO   5 mL at 05/06/21 0541   heparin injection 5,000 Units  5,000 Units Subcutaneous Q8H Shirley Friar, PA-C   5,000 Units at 05/06/21 1346   hydrALAZINE (APRESOLINE) tablet 50 mg  50 mg Oral BID Rise Patience, MD   50 mg at 05/06/21 0100   insulin aspart (novoLOG) injection 0-9 Units  0-9 Units Subcutaneous TID WC Rise Patience, MD   2 Units at 05/06/21 1151   isosorbide mononitrate (IMDUR) 24 hr tablet 30 mg  30 mg Oral Daily Rise Patience, MD   30 mg at 05/06/21 7121   lactated ringers infusion   Intravenous Continuous Noe Gens L, NP 40 mL/hr at 05/06/21 0000 Infusion Verify at 05/06/21 0000   levETIRAcetam (KEPPRA) tablet 500 mg  500 mg Oral QHS Rise Patience, MD   500 mg at 05/05/21 2134   mometasone-formoterol (DULERA) 100-5 MCG/ACT inhaler 2 puff  2 puff Inhalation BID Julian Hy, DO   2 puff at 05/05/21 2023   multivitamin with minerals tablet 1 tablet  1 tablet Oral Daily Noemi Chapel P, DO       nitroGLYCERIN (NITROSTAT) SL tablet 0.4 mg  0.4 mg Sublingual Q5 min PRN Rise Patience, MD       vitamin B-12 (CYANOCOBALAMIN) tablet 2,500 mcg  2,500 mcg Oral Daily Rise Patience, MD   2,500 mcg at 05/06/21 9758     Discharge Medications: Please see discharge summary for a list of discharge medications.  Relevant Imaging Results:  Relevant Lab Results:   Additional Information [phone], [place], LCSWA

## 2021-05-06 NOTE — Progress Notes (Signed)
[place] Progress Note Patient Name: Kayla [name] DOB: 11-27-1940 MRN: [id number]   Date of Service  05/06/2021  HPI/Events of Note  K+ 3.2, GFR 28  eICU Interventions  KCL 40 meq po x 1.        Kerry Kass Lachlyn Vanderstelt 05/06/2021, 2:38 AM

## 2021-05-06 NOTE — Evaluation (Signed)
Occupational Therapy Evaluation Patient Details Name: Kayla [name] MRN: [id number] DOB: 1941/02/24 Today's Date: 05/06/2021   History of Present Illness Pt adm 11/13 with 3rd degree heart block requiring temporary pacer with plans for permanent pacer tentatively on 11/17. Pt with recent adm 11/9-11/10 for heart block and with recent Covid (10/30). PMH - CAD s/p stent, DM, CKD, CVA, seizures   Clinical Impression   PTA, pt lives with spouse, ambulatory with cane vs no AD in the home and reports typically able to complete ADLs though has had [place] aide assist with showering tasks 2x/wk. Pt presents now with deficits in cognition, strength, standing balance and endurance. Pt able to stand with Min A using RW but unable to safely take steps without L LE buckling due to reported new lower L LE pain (RN aware). Pt declined to attempt further mobility due to pain/fatigue. Pt requires Min A for UB ADLs and up to Max A (x 2 for safety in standing) for LB ADLs. Began education on pacemaker precautions during functional tasks in prep for planned procedure tomorrow with continued reinforcement needed. Based on functional abilities today, would recommend SNF rehab as pt unable to safely mobilize in home or complete ADL tasks without extensive physical assist. Will continue to follow acutely and update DC recs as pt progresses.   HR 80s-100s SpO2 98% on RA     Recommendations for follow up therapy are one component of a multi-disciplinary discharge planning process, led by the attending physician.  Recommendations may be updated based on patient status, additional functional criteria and insurance authorization.   Follow Up Recommendations  Skilled nursing-short term rehab (<3 hours/day)    Assistance Recommended at Discharge Frequent or constant Supervision/Assistance  Functional Status Assessment  Patient has had a recent decline in their functional status and demonstrates the ability to make significant  improvements in function in a reasonable and predictable amount of time.  Equipment Recommendations  BSC/3in1    Recommendations for Other Services       Precautions / Restrictions Precautions Precautions: Fall;Other (comment) Precaution Comments: temporary pacer; pending PPM on 11/17 Restrictions Weight Bearing Restrictions: No      Mobility Bed Mobility               General bed mobility comments: received in chair    Transfers Overall transfer level: Needs assistance Equipment used: Rolling walker (2 wheels) Transfers: Sit to/from Stand Sit to Stand: Min assist           General transfer comment: Min A for power up from recliner, cues for hand placement and attempts to simulate pacemaker precautions for UE. Increased time to achieve balance/posture and unable to safely/successfully take steps from recliner      Balance Overall balance assessment: Needs assistance Sitting-balance support: No upper extremity supported;Feet supported Sitting balance-Leahy Scale: Fair     Standing balance support: Bilateral upper extremity supported;Reliant on assistive device for balance Standing balance-Leahy Scale: Poor Standing balance comment: walker + external assist to maintain static balance                           ADL either performed or assessed with clinical judgement   ADL Overall ADL's : Needs assistance/impaired Eating/Feeding: Set up;Sitting Eating/Feeding Details (indicate cue type and reason): assist to cut food Grooming: Set up;Sitting   Upper Body Bathing: Minimal assistance;Sitting   Lower Body Bathing: Maximal assistance;+2 for safety/equipment;Sit to/from stand   Upper Body Dressing :  Minimal assistance;Sitting   Lower Body Dressing: Maximal assistance;+2 for safety/equipment;+2 for physical assistance;Sit to/from stand Lower Body Dressing Details (indicate cue type and reason): unable to reach feet, unable to maintain dynamic balance  in standing without assist     Toileting- Clothing Manipulation and Hygiene: Maximal assistance;+2 for safety/equipment;Sit to/from stand         General ADL Comments: Pt with reports of L LE (around shin/ankle) pain, unable to march in place or take steps for mobility today. Poor balance and reliant on UE support for static standing. Educated on pacemaker precautions in prep for procedure with pt difficulty following through practice of precautions     Vision Ability to See in Adequate Light: 0 Adequate Patient Visual Report: No change from baseline Vision Assessment?: No apparent visual deficits     Perception     Praxis      Pertinent Vitals/Pain Pain Assessment: Faces Faces Pain Scale: Hurts even more Pain Location: L LE near ankle Pain Descriptors / Indicators: Grimacing;Guarding;Sore Pain Intervention(s): Limited activity within patient's tolerance;Monitored during session     Hand Dominance Right   Extremity/Trunk Assessment Upper Extremity Assessment Upper Extremity Assessment: Generalized weakness   Lower Extremity Assessment Lower Extremity Assessment: Defer to PT evaluation   Cervical / Trunk Assessment Cervical / Trunk Assessment: Kyphotic   Communication Communication Communication: HOH   Cognition Arousal/Alertness: Awake/alert Behavior During Therapy: WFL for tasks assessed/performed Overall Cognitive Status: No family/caregiver present to determine baseline cognitive functioning Area of Impairment: Following commands;Problem solving;Safety/judgement;Awareness;Orientation;Attention;Memory                 Orientation Level: Disoriented to;Time;Situation Current Attention Level: Selective Memory: Decreased short-term memory;Decreased recall of precautions Following Commands: Follows one step commands with increased time Safety/Judgement: Decreased awareness of deficits;Decreased awareness of safety Awareness: Intellectual Problem Solving: Slow  processing;Requires verbal cues;Requires tactile cues General Comments: Pt able to report it is November (reports the 10th and reports year as 1499). Pt able to follow directions with increased time and repetition due to [facility]. Pt with decreased memory (diffierent PLOF provided to PT vs OT). Unsure initially of why she was brought to hospital but when cued with "heart issues", able to recall HR was slow and plan for pacemaker placement.     General Comments  VSS on RA, (noted missed beats on monitor)    Exercises     Shoulder Instructions      Home Living Family/patient expects to be discharged to:: Private residence Living Arrangements: Spouse/significant other Available Help at Discharge: Family;Available 24 hours/day Type of Home: House Home Access: Stairs to enter [employer] of Steps: 2-3 Entrance Stairs-Rails: Right;Left;Can reach both Home Layout: One level     Bathroom Shower/Tub: Occupational psychologist: Standard     Home Equipment: Conservation officer, nature (2 wheels);Shower seat;Toilet riser;Cane - single point   Additional Comments: reports husband put railings in hallway for pt to hold on to      Prior Functioning/Environment Prior Level of Function : Needs assist       Physical Assist : ADLs (physical)   ADLs (physical): Bathing;IADLs;Dressing Mobility Comments: Pt reports ambulating with cane or without AD in the home ADLs Comments: Reports typically able to dress and sponge bathe self but reports recently began aide assist 2x/wk for showers. Reports previously completing IADLs but has not been able to do so lately        OT Problem List: Decreased strength;Decreased activity tolerance;Impaired balance (sitting and/or standing);Decreased cognition;Decreased safety  awareness;Decreased knowledge of use of DME or AE;Decreased knowledge of precautions;Pain      OT Treatment/Interventions: Self-care/ADL training;Therapeutic exercise;Energy  conservation;DME and/or AE instruction;Therapeutic activities;Patient/family education;Balance training    OT Goals(Current goals can be found in the care plan section) Acute Rehab OT Goals Patient Stated Goal: get better and go home when ready OT Goal Formulation: With patient Time For Goal Achievement: 05/20/21 Potential to Achieve Goals: Good  OT Frequency: Min 2X/week   Barriers to D/C:            Co-evaluation              AM-PAC OT "6 Clicks" Daily Activity     Outcome Measure Help from another person eating meals?: A Little Help from another person taking care of personal grooming?: A Little Help from another person toileting, which includes using toliet, bedpan, or urinal?: A Lot Help from another person bathing (including washing, rinsing, drying)?: A Lot Help from another person to put on and taking off regular upper body clothing?: A Little Help from another person to put on and taking off regular lower body clothing?: A Lot 6 Click Score: 15   End of Session Equipment Utilized During Treatment: Gait belt;Rolling walker (2 wheels) Nurse Communication: Mobility status;Other (comment) (tele leads)  Activity Tolerance: Patient limited by pain;Patient limited by fatigue Patient left: in chair;with call bell/phone within reach;with chair alarm set;with nursing/sitter in room  OT Visit Diagnosis: Unsteadiness on feet (R26.81);Other abnormalities of gait and mobility (R26.89);Muscle weakness (generalized) (M62.81);Pain Pain - Right/Left: Left Pain - part of body: Leg;Ankle and joints of foot                Time: 9826-4158 OT Time Calculation (min): 23 min Charges:  OT General Charges $OT Visit: 1 Visit OT Evaluation $OT Eval Moderate Complexity: 1 Mod OT Treatments $Therapeutic Activity: 8-22 mins  Malachy Chamber, OTR/L Acute Rehab Services Office: [phone]   Layla Maw 05/06/2021, 12:26 PM

## 2021-05-06 NOTE — Plan of Care (Signed)
  Problem: Education: Goal: Knowledge of General Education information will improve Description: Including pain rating scale, medication(s)/side effects and non-pharmacologic comfort measures Outcome: Progressing   Problem: Clinical Measurements: Goal: Will remain free from infection Outcome: Progressing Goal: Respiratory complications will improve Outcome: Progressing   Problem: Nutrition: Goal: Adequate nutrition will be maintained Outcome: Progressing   Problem: Coping: Goal: Level of anxiety will decrease Outcome: Progressing   Problem: Elimination: Goal: Will not experience complications related to urinary retention Outcome: Progressing   Problem: Pain Managment: Goal: General experience of comfort will improve Outcome: Progressing   Problem: Safety: Goal: Ability to remain free from injury will improve Outcome: Progressing

## 2021-05-06 NOTE — Progress Notes (Signed)
NAME:  Kayla [name], MRN:  [id number], DOB:  June 13, 1941, LOS: 2 ADMISSION DATE:  05/03/2021, CONSULTATION DATE:  05/04/21 REFERRING MD:  Beryl Meager, CHIEF COMPLAINT:  CHB   History of Present Illness:  80 y/o F with a history of CKD 3, DM, previous stroke, CAD, recently diagnosed second-degree heart block with 2-1 conduction that improved with discontinuation of beta-blocker during recent hospitalization 11/9-11/11 who presented 11/13 after becoming lethargic and confused.  She had a normal day on 11/12.  Yesterday when family checked her heart rate at home it was in the 30s.  She returned to the ER in complete heart block with AKI and hyperkalemia.  Overnight PCCM was consulted and noted that cardiac conduction improved with treatment of hyperkalemia.  Unfortunately overnight she developed progressive acidosis and heart block and underwent emergent TVP placement and was transferred to the ICU.  PCCM assuming primary care.    Pertinent  Medical History  CAD w/ DES in 2016 DM CKD III CVA 2nd degree HB Breast cancer Recent covid infection  Significant Hospital Events: Including procedures, antibiotic start and stop dates in addition to other pertinent events   11/13 Admit with severe bradycardia, hyperkalemia 11/14 TVP placement.  Renal US right renal cyst, benign.  No hydronephrosis.  Interim History / Subjective:  She denies complaints today.  Objective   Blood pressure 137/78, pulse 99, temperature 99.3 F (37.4 C), temperature source Oral, resp. rate (!) 21, height 5' 6.5" (1.689 m), weight 82.6 kg, SpO2 94 %.        Intake/Output Summary (Last 24 hours) at 05/06/2021 0943 Last data filed at 05/06/2021 0900 Gross per 24 hour  Intake 1030.04 ml  Output 2050 ml  Net -1019.96 ml    Filed Weights   05/05/21 0600 05/05/21 1008 05/06/21 0500  Weight: 81.1 kg 81.1 kg 82.6 kg    Examination: General: elderly woman lying in bed in NAD HEENT: [place]/AT, eyes anicteric Neuro:  sleeping but awakens easily to voice, moving all extremities CV: S1S2, RRR, paced rhythm PULM: breathing comfortably on RA, CTAB GI: soft, NT  Extremities: no significant edema, no cyanosis  Skin: warm, dry, no rashes  Resolved Hospital Problem list     Assessment & Plan:   Cardiogenic shock, bradycardia due to CHB> resolved with TVP Hx of 2:1 AV block that had improved after Bblocker discontinuation, now progressed to CHB intermittently TSH WNL -Needs ongoing ICU for care for transvenous pacemaker until PPM placement on 11/17. Appreciate EP's assistance. -Tele monitoring   History of CAD, HTN -continue statin, imdur, ASA -hold beta blocker with CHB. Avoid all AVN blocking agents.  -continue hydralazine, imdur  AGMA, suspect lactic acidosis AKI with hyperkalemia> hyperkalemia resolved Likely due to shock and ACE inhibitor use -continue TVP to ensure perfusion  -strict O/Os -con't to hold ACE and HCTZ until renal function back at baseline -monitor Cr -Replace electrolytes as indicated -Avoid nephrotoxic agents, ensure adequate renal perfusion -con't low rate of LR   Hypokalemia -repleted -con't to monitor -holding HCTZ -would benefit once able to restart ARB  Acute metabolic encephalopathy due to poor perfusion, renal failure> resolved -promote appropriate sleep wake cycles -avoid deliriogenic meds -family visitation  Recent COVID infection, ongoing cough Positive 10/30, out of window for isolation -continue dulera BID, PRN albuterol -- new this admission, recommend for about 1-2 weeks after cough stops, then can stop -mucinex BID  -follow up CXR in am with moist cough  DM Uncontrolled, A1c 8.7 -SSI PRN -goal BG  140-180 -con't to hold home glipizide and jardiance   Hyperbilirubinemia, likely related to heart block -no additional monitoring required  H/o CVA, seizures -keppra    Son and patient updated at bedside during rounds.  Best Practice (right  click and "Reselect all SmartList Selections" daily)  Diet/type: Regular consistency (see orders) Renal diet, carb modified DVT prophylaxis: prophylactic heparin  GI prophylaxis: N/A Lines: Central line and yes and it is still needed Foley:  N/A Code Status:  full code Last date of multidisciplinary goals of care discussion: full code.  Son updated at bedside 11/15.  Critical care time:      This patient is critically ill with multiple organ system failure which requires frequent high complexity decision making, assessment, support, evaluation, and titration of therapies. This was completed through the application of advanced monitoring technologies and extensive interpretation of multiple databases. During this encounter critical care time was devoted to patient care services described in this note for 33 minutes.   Julian Hy, DO 05/06/21 1:41 PM [facility]

## 2021-05-06 NOTE — TOC Initial Note (Addendum)
Transition of Care [facility]) - Initial/Assessment Note    Patient Details  Name: Kayla [name] MRN: [id number] Date of Birth: [birth year]  Transition of Care [facility]) CM/SW Contact:    Milas Gain, [place] Phone Number: 05/06/2021, 3:10 PM  Clinical Narrative:                  CSW received consult for possible SNF placement at time of discharge. CSW spoke with patient at bedside and patients spouse regarding PT recommendation of SNF placement at time of discharge. Patient reports she comes from home with spouse.Patient expressed understanding of PT recommendation and is agreeable to SNF placement at time of discharge. Patient gave CSW permission to fax out initial referral near the [place] area. CSW discussed insurance authorization process with patient and patients spouse.Patient reports she has received the COVID vaccines. Patients pasrr currently pending. CSW will submit clinicals to pasrr for review.No further questions reported at this time. CSW to continue to follow and assist with discharge planning needs.   Expected Discharge Plan: Skilled Nursing Facility Barriers to Discharge: Continued Medical Work up   Patient Goals and CMS Choice Patient states their goals for this hospitalization and ongoing recovery are:: SNF CMS Medicare.gov Compare Post Acute Care list provided to:: Patient Choice offered to / list presented to : Patient  Expected Discharge Plan and Services Expected Discharge Plan: [place]       Living arrangements for the past 2 months: Single Family Home                                      Prior Living Arrangements/Services Living arrangements for the past 2 months: Single Family Home Lives with:: Spouse Patient language and need for interpreter reviewed:: Yes Do you feel safe going back to the place where you live?: No   SNF  Need for Family Participation in Patient Care: Yes (Comment) Care giver support system in place?: Yes  (comment)   Criminal Activity/Legal Involvement Pertinent to Current Situation/Hospitalization: No - Comment as needed  Activities of Daily Living Home Assistive Devices/Equipment: Cane (specify quad or straight), CBG Meter, Hearing aid ADL Screening (condition at time of admission) Patient's cognitive ability adequate to safely complete daily activities?: No Is the patient deaf or have difficulty hearing?: Yes Does the patient have difficulty seeing, even when wearing glasses/contacts?: No Does the patient have difficulty concentrating, remembering, or making decisions?: Yes Patient able to express need for assistance with ADLs?: Yes Does the patient have difficulty dressing or bathing?: No Independently performs ADLs?: No Communication: Needs assistance Is this a change from baseline?: Pre-admission baseline Dressing (OT): Independent Grooming: Dependent Is this a change from baseline?: Pre-admission baseline Feeding: Independent Bathing: Needs assistance Is this a change from baseline?: Pre-admission baseline Toileting: Needs assistance Is this a change from baseline?: Pre-admission baseline In/Out Bed: Needs assistance Is this a change from baseline?: Pre-admission baseline Walks in Home: Independent Does the patient have difficulty walking or climbing stairs?: Yes Weakness of Legs: Both Weakness of Arms/Hands: Left  Permission Sought/Granted Permission sought to share information with : Case Manager, Family Supports, Customer service manager Permission granted to share information with : Yes, Verbal Permission Granted  Share Information with NAME: Derreck  Permission granted to share info w AGENCY: SNF  Permission granted to share info w Relationship: son  Permission granted to share info w Contact Information: Derreck [phone]  Emotional Assessment  Appearance:: Appears stated age Attitude/Demeanor/Rapport: Gracious Affect (typically observed):  Calm Orientation: : Oriented to Self, Oriented to Place, Oriented to  Time, Oriented to Situation (WDL) Alcohol / Substance Use: Not Applicable Psych Involvement: No (comment)  Admission diagnosis:  Complete heart block ([place]) [I44.2] Bradycardia [R00.1] Patient Active Problem List   Diagnosis Date Noted   Complete heart block ([place]) 05/04/2021   ARF (acute renal failure) ([place]) 05/04/2021   Bradycardia 05/03/2021   Malnutrition of moderate degree 04/30/2021   CKD (chronic kidney disease) stage 3, GFR 30-59 ml/[name] ([place]) 04/29/2021   COVID-19 virus infection 04/29/2021   AV block, 2nd degree 04/29/2021   2nd degree AV block    CAD (coronary artery disease)    Vascular dementia ([place]) 12/29/2018   Acute pain of both knees 04/09/2018   Routine general medical examination at a health care facility 03/31/2018   Drooling 08/27/2017   Seizure as late effect of cerebrovascular accident (CVA) ([place])    Blood in stool 02/26/2016   Decreased strength, endurance, and mobility 09/25/2015   Adjustment disorder with depressed mood 07/27/2015   Left homonymous hemianopsia 09/06/2014   Occipital infarction ([place]) 09/05/2014   Abnormal chest x-ray 08/22/2014   Type 2 diabetes mellitus with diabetic neuropathy ([place]) 01/04/2014   Hyperlipidemia associated with type 2 diabetes mellitus ([place]) 05/22/2009   Essential hypertension 05/04/2007   Osteoarthritis 05/04/2007   Fibromyalgia 05/04/2007   PCP:  Cipriano Mile, NP Pharmacy:   [id number] [place], [address] [place] [address] Phone: [phone] Fax: [phone]  [facility] [place] [place] [facility] [address] [place] [address] Phone: [phone] Fax: [place], [place] [address] Phone:  [phone] Fax: [phone]     Social Determinants of Health (SDOH) Interventions    Readmission Risk Interventions Readmission Risk Prevention Plan 05/01/2021  Transportation Screening Complete  PCP or Specialist Appt within 5-7 Days Complete  Home Care Screening Complete  Medication Review (RN CM) Complete  Some recent data might be hidden

## 2021-05-06 NOTE — Progress Notes (Signed)
Physical Therapy Treatment Patient Details Name: Kayla [name] MRN: [id number] DOB: February 09, 1941 Today's Date: 05/06/2021   History of Present Illness Pt adm 11/13 with 3rd degree heart block requiring temporary pacer with plans for permanent pacer tentatively on 11/17. Pt with recent adm 11/9-11/10 for heart block and with recent Covid (10/30). PMH - CAD s/p stent, DM, CKD, CVA, seizures    PT Comments    Pt limited by L foot pain this session, with limited tolerance for weightbearing. Pt with significant discomfort to light touch at L great toe and posterior ankle. Pt continues to require physical assistance to stand and safely transfer, with imbalance and weakness greatly increasing risk for falls at this time. Pt will benefit from continued acute PT services to reduce falls risk and improve transfer quality. PT updates recommendations to SNF placement based on the patients increased assistance requirements in transfers and inability to progress to ambulation. PT will continue to assess during admission.   Recommendations for follow up therapy are one component of a multi-disciplinary discharge planning process, led by the attending physician.  Recommendations may be updated based on patient status, additional functional criteria and insurance authorization.  Follow Up Recommendations  Skilled nursing-short term rehab (<3 hours/day)     Assistance Recommended at Discharge Frequent or constant Supervision/Assistance  Equipment Recommendations  Wheelchair (measurements PT)    Recommendations for Other Services       Precautions / Restrictions Precautions Precautions: Fall;Other (comment) Precaution Comments: temporary pacer; pending PPM on 11/17 Restrictions Weight Bearing Restrictions: No     Mobility  Bed Mobility Overal bed mobility: Needs Assistance Bed Mobility: Sit to Supine       Sit to supine: Max assist   General bed mobility comments: pt initially attempting to  return to bed but laying posteriorly across width of bed, Requires PT physical assistance to direct sit to supine    Transfers Overall transfer level: Needs assistance Equipment used: 2 person hand held assist;Rolling walker (2 wheels) Transfers: Sit to/from Stand;Bed to chair/wheelchair/BSC Sit to Stand: +2 physical assistance;Mod assist (modA x2 with bilateral hand hold, minA with use of walker)     Step pivot transfers: Mod assist;+2 physical assistance     General transfer comment: pt requiring cues for hand and foot placement for use of walker    Ambulation/Gait Ambulation/Gait assistance:  (pt declines attempts at ambulation due to fatigue and L foot pain)                 Stairs             Wheelchair Mobility    Modified Rankin (Stroke Patients Only)       Balance Overall balance assessment: Needs assistance Sitting-balance support: No upper extremity supported;Feet supported Sitting balance-Leahy Scale: Fair     Standing balance support: Bilateral upper extremity supported;Reliant on assistive device for balance Standing balance-Leahy Scale: Poor Standing balance comment: minG with BUE support of walker, minA x2 with bilateral hand hold                            Cognition Arousal/Alertness: Awake/alert Behavior During Therapy: WFL for tasks assessed/performed Overall Cognitive Status: Impaired/Different from baseline Area of Impairment: Memory;Orientation;Awareness;Safety/judgement;Problem solving                 Orientation Level: Disoriented to;Time Current Attention Level: Selective Memory: Decreased short-term memory Following Commands: Follows one step commands consistently Safety/Judgement: Decreased awareness of safety;Decreased  awareness of deficits Awareness: Intellectual Problem Solving: Slow processing;Difficulty sequencing General Comments: Pt able to report it is November (reports the 10th and reports year as  1499). Pt able to follow directions with increased time and repetition due to [facility]. Pt with decreased memory (diffierent PLOF provided to PT vs OT). Unsure initially of why she was brought to hospital but when cued with "heart issues", able to recall HR was slow and plan for pacemaker placement.        Exercises      General Comments General comments (skin integrity, edema, etc.): VSS on RA      Pertinent Vitals/Pain Pain Assessment: Faces Faces Pain Scale: Hurts whole lot Pain Location: L great toe and L posterior ankle Pain Descriptors / Indicators: Sharp;Sore Pain Intervention(s): Monitored during session    Home Living Family/patient expects to be discharged to:: Private residence Living Arrangements: Spouse/significant other Available Help at Discharge: Family;Available 24 hours/day Type of Home: House Home Access: Stairs to enter Entrance Stairs-Rails: Right;Left;Can reach both Entrance Stairs-Number of Steps: 2-3   Home Layout: One level Home Equipment: Conservation officer, nature (2 wheels);Shower seat;Toilet riser;Cane - single point Additional Comments: reports husband put railings in hallway for pt to hold on to    Prior Function            PT Goals (current goals can now be found in the care plan section) Acute Rehab PT Goals Patient Stated Goal: return home Progress towards PT goals: Not progressing toward goals - comment (limited by L foot pain)    Frequency    Min 3X/week      PT Plan Current plan remains appropriate    Co-evaluation              AM-PAC PT "6 Clicks" Mobility   Outcome Measure  Help needed turning from your back to your side while in a flat bed without using bedrails?: A Lot Help needed moving from lying on your back to sitting on the side of a flat bed without using bedrails?: A Lot Help needed moving to and from a bed to a chair (including a wheelchair)?: Total Help needed standing up from a chair using your arms (e.g., wheelchair or  bedside chair)?: A Little Help needed to walk in hospital room?: Total Help needed climbing 3-5 steps with a railing? : Total 6 Click Score: 10    End of Session   Activity Tolerance: Patient limited by fatigue;Patient limited by pain Patient left: in bed;with bed alarm set;with call bell/phone within reach;with family/visitor present Nurse Communication: Mobility status PT Visit Diagnosis: Unsteadiness on feet (R26.81);Other abnormalities of gait and mobility (R26.89);Muscle weakness (generalized) (M62.81)     Time: 1346-1410 PT Time Calculation (min) (ACUTE ONLY): 24 min  Charges:  $Therapeutic Activity: 23-37 mins                     Zenaida Niece, PT, DPT Acute Rehabilitation Pager: [phone] Office [place] Domique Reardon 05/06/2021, 2:23 PM

## 2021-05-06 NOTE — Progress Notes (Signed)
1615 - Report received from Martha, [place].  All questions answered.  Safety checks performed. Hand hygiene performed before/after each pt contact. Assessment. 1700 - Pt's dinner arrived. Pt's husband fed her.  She ate 100%. 19 - Pt's son arrived. 1800 - Pt's son left. 1900 - Report given to Lelan Pons, Therapist, sports.  All questions answered.

## 2021-05-07 ENCOUNTER — Encounter (HOSPITAL_COMMUNITY)
Admission: EM | Disposition: A | Discharge status: Skilled Nursing Facility | Payer: Self-pay | Source: Home / Self Care | Attending: Critical Care Medicine

## 2021-05-07 HISTORY — PX: PACEMAKER IMPLANT: EP1218

## 2021-05-07 LAB — GLUCOSE, CAPILLARY
Glucose-Capillary: 136 mg/dL — ABNORMAL HIGH (ref 70–99)
Glucose-Capillary: 122 mg/dL — ABNORMAL HIGH (ref 70–99)
Glucose-Capillary: 173 mg/dL — ABNORMAL HIGH (ref 70–99)

## 2021-05-07 LAB — CBC
HCT: 38.3 % (ref 36.0–46.0)
Hemoglobin: 12 g/dL (ref 12.0–15.0)
MCH: 28.6 pg (ref 26.0–34.0)
MCHC: 31.3 g/dL (ref 30.0–36.0)
MCV: 91.4 fL (ref 80.0–100.0)
Platelets: 172 10*3/uL (ref 150–400)
RBC: 4.19 MIL/uL (ref 3.87–5.11)
RDW: 17 % — ABNORMAL HIGH (ref 11.5–15.5)
WBC: 7.3 10*3/uL (ref 4.0–10.5)
nRBC: 0 % (ref 0.0–0.2)

## 2021-05-07 LAB — RENAL FUNCTION PANEL
Albumin: 2.5 g/dL — ABNORMAL LOW (ref 3.5–5.0)
Anion gap: 8 (ref 5–15)
BUN: 26 mg/dL — ABNORMAL HIGH (ref 8–23)
CO2: 25 mmol/L (ref 22–32)
Calcium: 8.3 mg/dL — ABNORMAL LOW (ref 8.9–10.3)
Chloride: 103 mmol/L (ref 98–111)
Creatinine, Ser: 1.59 mg/dL — ABNORMAL HIGH (ref 0.44–1.00)
GFR, Estimated: 32 mL/min — ABNORMAL LOW (ref >60)
Glucose, Bld: 175 mg/dL — ABNORMAL HIGH (ref 70–99)
Phosphorus: 2.5 mg/dL (ref 2.5–4.6)
Potassium: 4.5 mmol/L (ref 3.5–5.1)
Sodium: 136 mmol/L (ref 135–145)

## 2021-05-07 SURGERY — PACEMAKER IMPLANT

## 2021-05-07 MED ORDER — ACETAMINOPHEN 325 MG PO TABS: 325.0000 mg | ORAL_TABLET | ORAL | Status: DC | PRN

## 2021-05-07 MED ORDER — HEPARIN (PORCINE) IN NACL 1000-0.9 UT/500ML-% IV SOLN
INTRAVENOUS | Status: AC
  Filled 2021-05-07: qty 500

## 2021-05-07 MED ORDER — ONDANSETRON HCL 4 MG/2ML IJ SOLN: 4.0000 mg | Freq: Four times a day (QID) | INTRAMUSCULAR | Status: DC | PRN

## 2021-05-07 MED ORDER — CHLORHEXIDINE GLUCONATE 4 % EX LIQD
CUTANEOUS | Status: AC
  Filled 2021-05-07: qty 15

## 2021-05-07 MED ORDER — ACETAMINOPHEN 325 MG PO TABS: 650.0000 mg | ORAL_TABLET | Freq: Four times a day (QID) | ORAL | Status: DC | PRN

## 2021-05-07 MED ORDER — HYDRALAZINE HCL 20 MG/ML IJ SOLN
10.0000 mg | INTRAMUSCULAR | Status: DC | PRN
  Administered 2021-05-07: 07:00:00 10 mg via INTRAVENOUS
  Filled 2021-05-07: qty 1

## 2021-05-07 MED ORDER — SODIUM CHLORIDE 0.9 % IV SOLN
INTRAVENOUS | Status: AC
  Filled 2021-05-07: qty 2

## 2021-05-07 MED ORDER — VANCOMYCIN HCL IN DEXTROSE 1-5 GM/200ML-% IV SOLN
INTRAVENOUS | Status: AC
  Filled 2021-05-07: qty 200

## 2021-05-07 MED ORDER — LIDOCAINE HCL (PF) 1 % IJ SOLN
INTRAMUSCULAR | Status: DC | PRN
  Administered 2021-05-07: 60 mL via INTRADERMAL

## 2021-05-07 MED ORDER — HEPARIN (PORCINE) IN NACL 1000-0.9 UT/500ML-% IV SOLN
INTRAVENOUS | Status: DC | PRN
  Administered 2021-05-07 (×2): 500 mL

## 2021-05-07 MED ORDER — LIDOCAINE HCL (PF) 1 % IJ SOLN
INTRAMUSCULAR | Status: AC
  Filled 2021-05-07: qty 60

## 2021-05-07 SURGICAL SUPPLY — 13 items
CABLE SURGICAL S-101-97-12 (CABLE) ×2 IMPLANT
KIT ACCESSORY SELECTRA FIX CVD (MISCELLANEOUS) ×2 IMPLANT
LEAD SELECTRA 3D-55-42 (CATHETERS) ×2 IMPLANT
LEAD SOLIA S PRO MRI 53 (Lead) ×2 IMPLANT
LEAD SOLIA S PRO MRI 60 (Lead) ×2 IMPLANT
MAT PREVALON FULL STRYKER (MISCELLANEOUS) ×2 IMPLANT
PACEMAKER EDORA 8DR-T MRI (Pacemaker) ×2 IMPLANT
PAD PRO RADIOLUCENT 2001M-C (PAD) ×2 IMPLANT
POUCH AIGIS-R ANTIBACT PPM (Mesh General) ×2 IMPLANT
SHEATH 7FR PRELUDE SNAP 13 (SHEATH) ×2 IMPLANT
SHEATH 9FR PRELUDE SNAP 13 (SHEATH) ×2 IMPLANT
SHEATH PROBE COVER 6X72 (BAG) ×2 IMPLANT
TRAY PACEMAKER INSERTION (PACKS) ×2 IMPLANT

## 2021-05-07 NOTE — Progress Notes (Signed)
   05/07/21 2206  Assess: MEWS Score  Temp 98.3 F (36.8 C)  BP (!) 140/98  Pulse Rate (!) 113  ECG Heart Rate (!) 113  Resp (!) 25  Level of Consciousness Alert  SpO2 100 %  O2 Device Room Air  Assess: MEWS Score  MEWS Temp 0  MEWS Systolic 0  MEWS Pulse 2  MEWS RR 1  MEWS LOC 0  MEWS Score 3  MEWS Score Color Yellow  Assess: if the MEWS score is Yellow or Red  Were vital signs taken at a resting state? Yes  Focused Assessment No change from prior assessment  Early Detection of Sepsis Score *See Row Information* Medium  MEWS guidelines implemented *See Row Information* Yes  Treat  Pain Scale 0-10  Pain Score 0  Patients Stated Pain Goal 0  Take Vital Signs  Increase Vital Sign Frequency  Yellow: Q 2hr X 2 then Q 4hr X 2, if remains yellow, continue Q 4hrs  Escalate  MEWS: Escalate Yellow: discuss with charge nurse/RN and consider discussing with provider and RRT  Notify: Charge Nurse/RN  Name of Charge Nurse/RN Notified Shanell, RN  Date Charge Nurse/RN Notified 05/07/21  Time Charge Nurse/RN Notified 2220  Document  Patient Outcome Not stable and remains on department  Progress note created (see row info) Yes

## 2021-05-07 NOTE — Progress Notes (Signed)
PT Cancellation Note  Patient Details Name: KEIRRA ZEIMET MRN: [id number] DOB: 04-Apr-1941   Cancelled Treatment:    Reason Eval/Treat Not Completed: Patient declined, no reason specified this evening following placement of PPM earlier today. Pt reports pain and fatigue after procedure and requests PT follow up in AM to progress treatment.   [place], PT, DPT   Acute Rehabilitation Department Pager #: [phone]   Sandra Cockayne 05/07/2021, 5:39 PM

## 2021-05-07 NOTE — TOC Progression Note (Addendum)
Transition of Care [facility]) - Progression Note    Patient Details  Name: Kayla [name] MRN: [id number] Date of Birth: Jan 08, 1941  [facility]) CM/SW [place], [place] Phone Number: 05/07/2021, 3:58 PM  Clinical Narrative:     Update- CSW spoke with Juliann Pulse from [facility] who confirmed they can accept patient when medically ready. Juliann Pulse confirmed she will start insurance authorization on patient. CSW will continue to follow.  Update- CSW received call from patients son. Patients son confirmed that patient has chosen SNF placement at [facility]. CSW left voicemail for [facility] , CSW awaiting callback,  CSW provided patient and patients son with medicare compare SNF ratings list with accepted SNF bed offers for review. Patients son confirmed he will give CSW a call with confirmation of patients SNF choice shortly.CSW will continue to follow and assist with patients dc planning needs.  Expected Discharge Plan: [place] Barriers to Discharge: Continued Medical Work up  Expected Discharge Plan and Services Expected Discharge Plan: [place] arrangements for the past 2 months: Single Family Home                                       Social Determinants of Health (SDOH) Interventions    Readmission Risk Interventions Readmission Risk Prevention Plan 05/01/2021  Transportation Screening Complete  PCP or Specialist Appt within 5-7 Days Complete  Home Care Screening Complete  Medication Review (RN CM) Complete  Some recent data might be hidden

## 2021-05-07 NOTE — Procedures (Deleted)
Central Venous Catheter Insertion Procedure Note  Kayla [name]  [id number]  07/10/1940  Date:05/07/21  Time:4:28 PM   Provider Performing:Dantonio Justen   Procedure: Insertion of Non-tunneled Central Venous Catheter(36556)with US guidance (13887)    Indication(s) Hemodialysis  Consent Risks of the procedure as well as the alternatives and risks of each were explained to the patient and/or caregiver.  Consent for the procedure was obtained and is signed in the bedside chart  Anesthesia Topical only with 1% lidocaine   Timeout Verified patient identification, verified procedure, site/side was marked, verified correct patient position, special equipment/implants available, medications/allergies/relevant history reviewed, required imaging and test results available.  Sterile Technique Maximal sterile technique including full sterile barrier drape, hand hygiene, sterile gown, sterile gloves, mask, hair covering, sterile ultrasound probe cover (if used).  Procedure Description Area of catheter insertion was cleaned with chlorhexidine and draped in sterile fashion.   With real-time ultrasound guidance a HD catheter was placed into the right internal jugular vein.  Nonpulsatile blood flow and easy flushing noted in all ports.  The catheter was sutured in place and sterile dressing applied.  Complications/Tolerance None; patient tolerated the procedure well. Chest X-ray is ordered to verify placement for internal jugular or subclavian cannulation.  Chest x-ray is not ordered for femoral cannulation.  EBL Minimal  Specimen(s) None   [name], MD Internal Medicine Resident PGY-3 [name] 05/07/2021 4:28 PM

## 2021-05-07 NOTE — Progress Notes (Signed)
eLink Physician-Brief Progress Note Patient Name: Kayla [name] DOB: February 15, 1941 MRN: [id number]   Date of Service  05/07/2021  HPI/Events of Note  Patient with sub-optimal blood pressure control.  eICU Interventions  PRN Hydralazine ordered to optimize BP control.     Intervention Category Intermediate Interventions: Hypertension - evaluation and management  Frederik Pear 05/07/2021, 6:28 AM

## 2021-05-07 NOTE — Progress Notes (Signed)
NAME:  Kayla [name], MRN:  [id number], DOB:  01/29/1941, LOS: 3 ADMISSION DATE:  05/03/2021, CONSULTATION DATE:  05/04/21 REFERRING MD:  Beryl Meager, CHIEF COMPLAINT:  CHB   History of Present Illness:  80 y/o F with a history of CKD 3, DM, previous stroke, CAD, recently diagnosed second-degree heart block with 2-1 conduction that improved with discontinuation of beta-blocker during recent hospitalization 11/9-11/11 who presented 11/13 after becoming lethargic and confused.  She had a normal day on 11/12.  Yesterday when family checked her heart rate at home it was in the 30s.  She returned to the ER in complete heart block with AKI and hyperkalemia.  Overnight PCCM was consulted and noted that cardiac conduction improved with treatment of hyperkalemia.  Unfortunately overnight she developed progressive acidosis and heart block and underwent emergent TVP placement and was transferred to the ICU.  PCCM assuming primary care.    Pertinent  Medical History  CAD w/ DES in 2016 DM CKD III CVA 2nd degree HB Breast cancer Recent covid infection  Significant Hospital Events: Including procedures, antibiotic start and stop dates in addition to other pertinent events   11/13 Admit with severe bradycardia, hyperkalemia 11/14 TVP placement.  Renal US right renal cyst, benign.  No hydronephrosis.  Interim History / Subjective:  PPM today.  She continues to complain of left toe pain.  No history of gout.  Objective   Blood pressure (!) 142/70, pulse 98, temperature 98.5 F (36.9 C), temperature source Oral, resp. rate (!) 22, height 5' 6.5" (1.689 m), weight 76.7 kg, SpO2 100 %.        Intake/Output Summary (Last 24 hours) at 05/07/2021 0920 Last data filed at 05/07/2021 6789 Gross per 24 hour  Intake 1377.33 ml  Output 2500 ml  Net -1122.67 ml    Filed Weights   05/05/21 1008 05/06/21 0500 05/07/21 0334  Weight: 81.1 kg 82.6 kg 76.7 kg    Examination: General: Elderly woman lying in  bed no acute distress HEENT: [name], eyes anicteric Neuro: Laying in bed, moving all extremities, answering questions appropriately.  Very forgetful.  Relies on her husband to help give a history. CV: S1-S2, regular rate and rhythm, V-paced rhythm PULM: Breathing comfortably on room air.  Clear to auscultation bilaterally. GI: Soft, nontender, nondistended Extremities: No edema, no clubbing or cyanosis Skin: Warm, dry, no rashes  Resolved Hospital Problem list     Assessment & Plan:   Cardiogenic shock, bradycardia due to CHB> resolved with TVP Hx of 2:1 AV block that had improved after Bblocker discontinuation, now progressed to CHB intermittently TSH WNL -Ongoing ICU care required until permanent pacemaker is placed.  Tentatively this will happen today.  Continue TVP. -Monitoring  History of CAD, HTN -Continue daily aspirin and statin.  Continue Imdur -We will resume losartan when renal function is back to baseline. -Hydralazine as needed -Hold beta-blocker no AV node blocking agents  AGMA, suspect lactic acidosis AKI with hyperkalemia> hyperkalemia resolved Likely due to shock and ACE inhibitor use -Continue TVP to maintain cardiac output and ensure adequate renal perfusion -Strict I's/O - Renally dose meds, avoid nephrotoxic meds - Continue to hold ACE inhibitor and her chlorothiazide until renal function is back to normal or plateaus. -Electrolyte repletion as needed  Hypokalemia, resolved -continue to monitor - Will likely benefit when ARB is reinitiated -Continue to hold HCTZ  Acute metabolic encephalopathy due to poor perfusion, renal failure> resolved Suspect there is likely -Family visitation helps  Recent COVID infection,  ongoing cough Positive 10/30, out of window for isolation -Continue Dulera twice daily.  Albuterol as needed.  Push to be continued until her symptoms resolved.  Okay to stop about 2 weeks after symptoms resolved, sooner if she has intolerance  as this is mostly symptomatic management. -mucinex BID   DM, uncontrolled, A1c 8.7 -Sliding scale insulin as needed -Continue to hold home glipizide and Jardiance - Goal BG less than 180  Hyperbilirubinemia, likely related to heart block -no additional monitoring required  H/o CVA, seizures -Continue PTA Keppra  Toe pain-- no erythema, warmth, allodynia to suggest gout. Uric acid WNL. -Acetaminophen  Husband and patient updated at bedside during rounds.  Best Practice (right click and "Reselect all SmartList Selections" daily)  Diet/type: Regular consistency (see orders) Renal diet, carb modified DVT prophylaxis: prophylactic heparin  GI prophylaxis: N/A Lines: Central line and yes and it is still needed Foley:  N/A Code Status:  full code Last date of multidisciplinary goals of care discussion: full code.  Son updated at bedside 11/15.  Critical care time:      This patient is critically ill with multiple organ system failure which requires frequent high complexity decision making, assessment, support, evaluation, and titration of therapies. This was completed through the application of advanced monitoring technologies and extensive interpretation of multiple databases. During this encounter critical care time was devoted to patient care services described in this note for 32 minutes.   Julian Hy, DO 05/07/21 1:57 PM [facility]

## 2021-05-07 NOTE — Progress Notes (Signed)
Patient ID: HEELA HEISHMAN, female   DOB: [birth year], 80 y.o.   MRN: [id number] S: complaining of cough O:BP (!) 142/70   Pulse 98   Temp 98.5 F (36.9 C) (Oral)   Resp (!) 22   Ht 5' 6.5" (1.689 m)   Wt 76.7 kg Comment: bed  SpO2 100%   BMI 26.89 kg/m   Intake/Output Summary (Last 24 hours) at 05/07/2021 0936 Last data filed at 05/07/2021 4098 Gross per 24 hour  Intake 1377.33 ml  Output 2500 ml  Net -1122.67 ml   Intake/Output: I/O last 3 completed shifts: In: 1957.9 [P.O.:540; I.V.:1417.9] Out: 3950 [Urine:3950]  Intake/Output this shift:  No intake/output data recorded. Weight change: -4.4 kg Gen: NAD, slowed mentation CVS: RRR Resp:CTA Abd: +BS, soft, NT/Nd Ext:No edema  Recent Labs  Lab 05/03/21 2035 05/04/21 0353 05/04/21 1202 05/04/21 2246 05/05/21 0430 05/06/21 0043 05/07/21 0311  NA 139 139 140 137 138 139 136  K 5.5* 5.3* 3.9 3.4* 3.3* 3.2* 4.5  CL 109 111 109 102 99 102 103  CO2 17* 14* 20* 25 28 27 25   GLUCOSE 152* 138* 188* 185* 186* 126* 175*  BUN 55* 55* 47* 47* 39* 27* 26*  CREATININE 3.72* 3.51* 2.81* 2.56* 2.19* 1.79* 1.59*  ALBUMIN  --  3.0* 2.8* 2.7* 2.6*  --  2.5*  CALCIUM 9.1 8.7* 8.5* 8.1* 8.0* 7.9* 8.3*  PHOS  --   --  4.2 3.5 3.2  --  2.5  AST  --  30  --   --   --   --   --   ALT  --  18  --   --   --   --   --    Liver Function Tests: Recent Labs  Lab 05/04/21 0353 05/04/21 1202 05/04/21 2246 05/05/21 0430 05/07/21 0311  AST 30  --   --   --   --   ALT 18  --   --   --   --   ALKPHOS 59  --   --   --   --   BILITOT 1.4*  --   --   --   --   PROT 6.4*  --   --   --   --   ALBUMIN 3.0*   < > 2.7* 2.6* 2.5*   < > = values in this interval not displayed.   No results for input(s): LIPASE, AMYLASE in the last 168 hours. No results for input(s): AMMONIA in the last 168 hours. CBC: Recent Labs  Lab 05/03/21 2035 05/04/21 0353 05/06/21 0043 05/07/21 0311  WBC 6.8 6.0 6.4 7.3  NEUTROABS  --  3.4  --   --   HGB 15.9*  14.7 12.7 12.0  HCT 50.5* 46.7* 38.9 38.3  MCV 93.0 91.6 89.2 91.4  PLT 253 234 199 172   Cardiac Enzymes: No results for input(s): CKTOTAL, CKMB, CKMBINDEX, TROPONINI in the last 168 hours. CBG: Recent Labs  Lab 05/06/21 0701 05/06/21 1138 05/06/21 1638 05/06/21 2122 05/07/21 0647  GLUCAP 156* 197* 191* 123* 136*    Iron Studies: No results for input(s): IRON, TIBC, TRANSFERRIN, FERRITIN in the last 72 hours. Studies/Results: DG CHEST PORT 1 VIEW  Result Date: 05/06/2021 CLINICAL DATA:  Cough, bradycardia EXAM: PORTABLE CHEST 1 VIEW COMPARISON:  Previous studies including the examination of 05/03/2021 FINDINGS: Transverse diameter of heart is increased. Thoracic aorta is tortuous and ectatic. There is poor inspiration. Increased interstitial markings are seen in the  lower lung fields. There are no signs of alveolar pulmonary edema. There is no significant pleural effusion or pneumothorax. IMPRESSION: Increased interstitial markings in the lower lung fields may suggest crowding of bronchovascular structures due to poor inspiration or interstitial pneumonitis. Electronically Signed   By: Elmer Picker M.D.   On: 05/06/2021 08:15    aspirin EC  81 mg Oral Daily   atorvastatin  20 mg Oral QHS   chlorhexidine  60 mL Topical Once   Chlorhexidine Gluconate Cloth  6 each Topical Daily   feeding supplement  237 mL Oral TID BM   gentamicin irrigation  80 mg Irrigation On Call   guaiFENesin  600 mg Oral BID   hydrALAZINE  50 mg Oral BID   insulin aspart  0-9 Units Subcutaneous TID WC   isosorbide mononitrate  30 mg Oral Daily   levETIRAcetam  500 mg Oral QHS   mometasone-formoterol  2 puff Inhalation BID   multivitamin with minerals  1 tablet Oral Daily   vitamin B-12  2,500 mcg Oral Daily    BMET    Component Value Date/Time   NA 136 05/07/2021 0311   K 4.5 05/07/2021 0311   CL 103 05/07/2021 0311   CO2 25 05/07/2021 0311   GLUCOSE 175 (H) 05/07/2021 0311   BUN 26 (H)  05/07/2021 0311   CREATININE 1.59 (H) 05/07/2021 0311   CALCIUM 8.3 (L) 05/07/2021 0311   GFRNONAA 32 (L) 05/07/2021 0311   GFRAA 48 (L) 06/24/2016 0709   CBC    Component Value Date/Time   WBC 7.3 05/07/2021 0311   RBC 4.19 05/07/2021 0311   HGB 12.0 05/07/2021 0311   HCT 38.3 05/07/2021 0311   PLT 172 05/07/2021 0311   MCV 91.4 05/07/2021 0311   MCH 28.6 05/07/2021 0311   MCHC 31.3 05/07/2021 0311   RDW 17.0 (H) 05/07/2021 0311   LYMPHSABS 1.3 05/04/2021 0353   MONOABS 0.9 05/04/2021 0353   EOSABS 0.2 05/04/2021 0353   BASOSABS 0.1 05/04/2021 0353    Assessment/Plan:  Makhayla Mcmurry Reamer is a 80 y/o F with PMHx significant for CAD s/p PCI, CKD stage 3b, T2DM, HTN, HLD, previous CVA, recent COVID-19 infection (dx 10/30) who was admitted for bradycardia and found to have complete AV block. She has since developed a significant AKI and nephrology has been consulted.    Non-Oliguric AKI on CKD Stage 3b: presumably due to volume depletion with poor po intake further exacerbated by bradycardia and recent covid-19 infection and likely pre-renal/ischemic ATN.   Improving with hydration and holding ACE-I. Creatinine 2.81>2.56>2.19>1.79 and now 1.59 (probably her baseline). Renal US without obstruction. UA without Hgb or protein, only rare bacteria and >500 glucose. Urine protein creatinine ratio 0.31.  Avoid nephrotoxic agents as possible including phosphorous containing bowel preps (FLEETS), NSAIDs, and IV contrast. Nothing further to add and will sign off.  Will arrange for outpatient followup after discharge.  Non-Anion Gap Metabolic Acidosis: Resolved. continue with LR for now. Hypokalemia: Previously hyper, now hypo at 3.3. Repleted with 40 mEq and continue to replete.  Uremia: Improving. BUN 55>27. Complete Heart Block s/p temporary pacemaker: Emergent temporary pacemaker placement per cardiology on 11/14. Plan for dual chamber PPM placement tomorrow per Cardiology. Recent COVID  infection: Diagnosed 10/30, has continued cough. On room air, afebrile. CXR 11/13 without signs of pneumonia. HTN: Home Lisinopril-HCTZ held. On imdur and hydralazine. Normotensive. Continue to hold Lisinopril. Type 2 DM: Hgb A1c 8.7%. CBG, on SSI. Per primary. HLD  CAD  Hx of CVA: Continue statin, plavix, ASA. History of seizures: on Keppra, per primary. Deconditioning: Would recommend PT/OT prior to d/c.   Donetta Potts, MD [employer] [phone]

## 2021-05-07 NOTE — Progress Notes (Signed)
Electrophysiology Rounding Note  Patient Name: Kayla [name] Date of Encounter: 05/07/2021  Primary Cardiologist: Fransico Him, MD Electrophysiologist: Dr. Quentin Ore   Subjective   Somewhat hypertensive overnight, otherwise doing well.    Inpatient Medications    Scheduled Meds:  aspirin EC  81 mg Oral Daily   atorvastatin  20 mg Oral QHS   chlorhexidine  60 mL Topical Once   Chlorhexidine Gluconate Cloth  6 each Topical Daily   feeding supplement  237 mL Oral TID BM   gentamicin irrigation  80 mg Irrigation On Call   guaiFENesin  600 mg Oral BID   hydrALAZINE  50 mg Oral BID   insulin aspart  0-9 Units Subcutaneous TID WC   isosorbide mononitrate  30 mg Oral Daily   levETIRAcetam  500 mg Oral QHS   mometasone-formoterol  2 puff Inhalation BID   multivitamin with minerals  1 tablet Oral Daily   vitamin B-12  2,500 mcg Oral Daily   Continuous Infusions:  sodium chloride     sodium chloride     lactated ringers 40 mL/hr at 05/07/21 0614   vancomycin     PRN Meds: acetaminophen, albuterol, guaiFENesin-dextromethorphan, hydrALAZINE, nitroGLYCERIN   Vital Signs    Vitals:   05/07/21 0500 05/07/21 0600 05/07/21 0645 05/07/21 0700  BP: (!) 164/67 (!) 170/91 (!) 164/75 (!) 142/70  Pulse: 75 99 88 98  Resp: (!) 22 18 (!) 21 (!) 22  Temp:      TempSrc:      SpO2: 100% 99% 100% 100%  Weight:      Height:        Intake/Output Summary (Last 24 hours) at 05/07/2021 0733 Last data filed at 05/07/2021 0623 Gross per 24 hour  Intake 1471.67 ml  Output 2750 ml  Net -1278.33 ml   Filed Weights   05/05/21 1008 05/06/21 0500 05/07/21 0334  Weight: 81.1 kg 82.6 kg 76.7 kg    Physical Exam    GEN- The patient is elderly appearing, alert and oriented x 3 today.   Head- normocephalic, atraumatic Eyes-  Sclera clear, conjunctiva pink Ears- hearing intact Oropharynx- clear Neck- supple Lungs- Clear to ausculation bilaterally, normal work of breathing Heart- Regular  rate and rhythm, no murmurs, rubs or gallops GI- soft, NT, ND, + BS Extremities- no clubbing or cyanosis. No edema Skin- no rash or lesion Psych- euthymic mood, full affect Neuro- strength and sensation are intact  Labs    CBC Recent Labs    05/06/21 0043 05/07/21 0311  WBC 6.4 7.3  HGB 12.7 12.0  HCT 38.9 38.3  MCV 89.2 91.4  PLT 199 762   Basic Metabolic Panel Recent Labs    05/05/21 0430 05/06/21 0043 05/07/21 0311  NA 138 139 136  K 3.3* 3.2* 4.5  CL 99 102 103  CO2 28 27 25   GLUCOSE 186* 126* 175*  BUN 39* 27* 26*  CREATININE 2.19* 1.79* 1.59*  CALCIUM 8.0* 7.9* 8.3*  PHOS 3.2  --  2.5   Liver Function Tests Recent Labs    05/05/21 0430 05/07/21 0311  ALBUMIN 2.6* 2.5*   No results for input(s): LIPASE, AMYLASE in the last 72 hours. Cardiac Enzymes No results for input(s): CKTOTAL, CKMB, CKMBINDEX, TROPONINI in the last 72 hours.   Telemetry    NSR 80-90s primarily by chart review, intermittently drops   Radiology    DG CHEST PORT 1 VIEW  Result Date: 05/06/2021 CLINICAL DATA:  Cough, bradycardia EXAM: PORTABLE CHEST  1 VIEW COMPARISON:  Previous studies including the examination of 05/03/2021 FINDINGS: Transverse diameter of heart is increased. Thoracic aorta is tortuous and ectatic. There is poor inspiration. Increased interstitial markings are seen in the lower lung fields. There are no signs of alveolar pulmonary edema. There is no significant pleural effusion or pneumothorax. IMPRESSION: Increased interstitial markings in the lower lung fields may suggest crowding of bronchovascular structures due to poor inspiration or interstitial pneumonitis. Electronically Signed   By: Elmer Picker M.D.   On: 05/06/2021 08:15    Patient Profile     Kayla [name] is a 80 y.o. female with a history of CAD s/p PCI, poorly controlled DM2, CKD III, Breast CA, GERD, Sickle cell trait, h/o CVA, recent COVID infection (04/19/21) and ? Medical non compliance  who is being seen for the evaluation of bradycardia at the request of Dr. Bonner Puna.  Assessment & Plan    Intermittent CHB By chart review remains in NSR 80-90s mostly. Throughout stay has continued ot intermittently drop conduction.    Planning for Dual chamber PPM tomorrow with left bundle area pacemaker lead (Biotronik).   Procedure discussed with patient and family at the bedside.   Risks, benefits, alternatives to PPM implantation have been discussed in detail with the patient. The patient understands that the risks include but are not limited to bleeding, infection, pneumothorax, perforation, tamponade, vascular damage, renal failure, MI, stroke, death, and lead dislodgement and wishes to proceed.        2. AKI Cr 1.59 this am.   3. Hypokalemia K 4.5   4. Uremia/Encephalopathy Improving with pacing.   5. HTN Elevated, will improve and have better options post pacing.    6. Recent COVID infection Home test + 04/19/21. She is out of contact precaution window.    7. H/o SVT Previously identified on loop recorder (RRT 2019)  Plan for PPM this afternoon.   For questions or updates, please contact [place] Please consult [email or web address] for contact info under Cardiology/STEMI.  Signed, Shirley Friar, PA-C  05/07/2021, 7:33 AM

## 2021-05-08 ENCOUNTER — Encounter (HOSPITAL_COMMUNITY): Payer: Self-pay | Admitting: Cardiology

## 2021-05-08 ENCOUNTER — Inpatient Hospital Stay (HOSPITAL_COMMUNITY): Payer: Medicare (Managed Care)

## 2021-05-08 DIAGNOSIS — L8990 Pressure ulcer of unspecified site, unspecified stage: Secondary | ICD-10-CM | POA: Insufficient documentation

## 2021-05-08 LAB — BASIC METABOLIC PANEL
Anion gap: 10 (ref 5–15)
BUN: 23 mg/dL (ref 8–23)
CO2: 21 mmol/L — ABNORMAL LOW (ref 22–32)
Calcium: 8.8 mg/dL — ABNORMAL LOW (ref 8.9–10.3)
Chloride: 103 mmol/L (ref 98–111)
Creatinine, Ser: 1.49 mg/dL — ABNORMAL HIGH (ref 0.44–1.00)
GFR, Estimated: 35 mL/min — ABNORMAL LOW (ref >60)
Glucose, Bld: 135 mg/dL — ABNORMAL HIGH (ref 70–99)
Potassium: 4.8 mmol/L (ref 3.5–5.1)
Sodium: 134 mmol/L — ABNORMAL LOW (ref 135–145)

## 2021-05-08 LAB — GLUCOSE, CAPILLARY
Glucose-Capillary: 136 mg/dL — ABNORMAL HIGH (ref 70–99)
Glucose-Capillary: 223 mg/dL — ABNORMAL HIGH (ref 70–99)
Glucose-Capillary: 232 mg/dL — ABNORMAL HIGH (ref 70–99)
Glucose-Capillary: 159 mg/dL — ABNORMAL HIGH (ref 70–99)

## 2021-05-08 MED ORDER — GUAIFENESIN-DM 100-10 MG/5ML PO SYRP
10.0000 mL | ORAL_SOLUTION | ORAL | Status: DC | PRN
  Administered 2021-05-08 – 2021-05-10 (×3): 10 mL via ORAL
  Filled 2021-05-08 (×3): qty 10

## 2021-05-08 MED ORDER — CLOPIDOGREL BISULFATE 75 MG PO TABS: 75.0000 mg | ORAL_TABLET | Freq: Every day | ORAL | Status: DC

## 2021-05-08 MED ORDER — POLYETHYLENE GLYCOL 3350 17 G PO PACK
17.0000 g | PACK | Freq: Every day | ORAL | Status: DC
  Administered 2021-05-08 – 2021-05-11 (×4): 17 g via ORAL
  Filled 2021-05-08 (×4): qty 1

## 2021-05-08 MED ORDER — ACETAMINOPHEN 325 MG PO TABS
325.0000 mg | ORAL_TABLET | ORAL | Status: DC | PRN
Start: 2021-05-08 — End: 2021-05-11
  Administered 2021-05-11: 650 mg via ORAL
  Filled 2021-05-08: qty 2

## 2021-05-08 MED ORDER — METOPROLOL TARTRATE 25 MG PO TABS
25.0000 mg | ORAL_TABLET | Freq: Two times a day (BID) | ORAL | Status: DC
  Administered 2021-05-08 – 2021-05-11 (×7): 25 mg via ORAL
  Filled 2021-05-08 (×7): qty 1

## 2021-05-08 MED ORDER — HEPARIN SODIUM (PORCINE) 5000 UNIT/ML IJ SOLN
5000.0000 [IU] | Freq: Three times a day (TID) | INTRAMUSCULAR | Status: DC
  Administered 2021-05-08 – 2021-05-11 (×9): 5000 [IU] via SUBCUTANEOUS
  Filled 2021-05-08 (×9): qty 1

## 2021-05-08 MED ORDER — GUAIFENESIN ER 600 MG PO TB12
1200.0000 mg | ORAL_TABLET | Freq: Two times a day (BID) | ORAL | Status: DC
  Administered 2021-05-08 – 2021-05-11 (×6): 1200 mg via ORAL
  Filled 2021-05-08 (×6): qty 2

## 2021-05-08 MED ORDER — SENNA 8.6 MG PO TABS
1.0000 | ORAL_TABLET | Freq: Every day | ORAL | Status: DC
  Administered 2021-05-08 – 2021-05-11 (×4): 8.6 mg via ORAL
  Filled 2021-05-08 (×4): qty 1

## 2021-05-08 NOTE — Care Management Important Message (Signed)
Important Message  Patient Details  Name: EMELLY WURTZ MRN: [id number] Date of Birth: April 11, 1941   Medicare Important Message Given:  Yes     Shelda Altes 05/08/2021, 11:22 AM

## 2021-05-08 NOTE — TOC Progression Note (Signed)
Transition of Care [facility]) - Progression Note    Patient Details  Name: Kayla [name] MRN: [id number] Date of Birth: [birth year]  Transition of Care [facility]) CM/SW [place], [place] Phone Number: 05/08/2021, 10:09 AM  Clinical Narrative:     CSW spoke with Juliann Pulse at [facility]. Insurance authorization is pending. CSW will continue to follow and assist with patients dc planning needs.  Expected Discharge Plan: [place] Barriers to Discharge: Continued Medical Work up  Expected Discharge Plan and Services Expected Discharge Plan: [place] arrangements for the past 2 months: Single Family Home                                       Social Determinants of Health (SDOH) Interventions    Readmission Risk Interventions Readmission Risk Prevention Plan 05/01/2021  Transportation Screening Complete  PCP or Specialist Appt within 5-7 Days Complete  Home Care Screening Complete  Medication Review (RN CM) Complete  Some recent data might be hidden

## 2021-05-08 NOTE — Progress Notes (Signed)
Occupational Therapy Treatment Patient Details Name: Kayla [name] MRN: [id number] DOB: 03/19/1941 Today's Date: 05/08/2021   History of present illness Pt adm 11/13 with 3rd degree heart block requiring temporary pacer. S/p PPM placement 11/17.Pt with recent adm 11/9-11/10 for heart block and with recent Covid (10/30). PMH - CAD s/p stent, DM, CKD, CVA, seizures   OT comments  Pt progressing towards acute OT goals. Pt needed min A for bed mobility, min A +2 physical assist to stand, mod A +2 physical assist to step pivot to recliner. HR 121 upon standing, 137 after step pivot to access recliner. Pt also reporting BLE fatigue limiting mobility. Pt up in recliner at end of session, son present. D/c plan remains appropriate.   Recommendations for follow up therapy are one component of a multi-disciplinary discharge planning process, led by the attending physician.  Recommendations may be updated based on patient status, additional functional criteria and insurance authorization.    Follow Up Recommendations  Skilled nursing-short term rehab (<3 hours/day)    Assistance Recommended at Discharge Frequent or constant Supervision/Assistance  Equipment Recommendations  BSC/3in1    Recommendations for Other Services      Precautions / Restrictions Precautions Precautions: Fall;ICD/Pacemaker Precaution Comments: PPM with L arm restrictions Required Braces or Orthoses: Sling (L UE) Restrictions Weight Bearing Restrictions: Yes LUE Weight Bearing:  (pacemaker precautions) Other Position/Activity Restrictions: pacemaker precautions       Mobility Bed Mobility Overal bed mobility: Needs Assistance Bed Mobility: Supine to Sit     Supine to sit: Min assist;HOB elevated     General bed mobility comments: Cues provided to bring legs off R EOB, needing extra time and repetition to complete. Cues provided to use bed rail with R UE and then ascend trunk pushing up from R elbow > hand, minA to  complete and scoot hips to EOB.    Transfers Overall transfer level: Needs assistance Equipment used: Rolling walker (2 wheels) Transfers: Sit to/from Stand;Bed to chair/wheelchair/BSC Sit to Stand: +2 physical assistance;Min assist;+2 safety/equipment   Step pivot transfers: Mod assist;+2 physical assistance;+2 safety/equipment       General transfer comment: Pt initiating sit to stand from EOB with minA, but requiring minAx2 to complete through cuing pt on hand placement, transition of R hand bed > RW, and extending hips and knees to stand upright. ModAx2 to steady, manage RW, and cue weight shifting to stand step transfer to R bed > recliner with R UE on RW only and L arm in sling.     Balance Overall balance assessment: Needs assistance Sitting-balance support: No upper extremity supported;Feet supported Sitting balance-Leahy Scale: Fair     Standing balance support: Reliant on assistive device for balance;Single extremity supported Standing balance-Leahy Scale: Poor Standing balance comment: Reliant on 1 UE support and modAx2 for stanidng mobility.                           ADL either performed or assessed with clinical judgement   ADL Overall ADL's : Needs assistance/impaired                         Toilet Transfer: Moderate assistance;+2 for physical assistance;+2 for safety/equipment;Stand-pivot             General ADL Comments: Pt step-pivoted to sit up in recliner. Limited by HR up to 121 in standing and pt r/o BLE fatigue.    Extremity/Trunk Assessment Upper  Extremity Assessment Upper Extremity Assessment: Generalized weakness   Lower Extremity Assessment Lower Extremity Assessment: Defer to PT evaluation        Vision       Perception     Praxis      Cognition Arousal/Alertness: Awake/alert Behavior During Therapy: WFL for tasks assessed/performed Overall Cognitive Status: Impaired/Different from baseline Area of  Impairment: Memory;Awareness;Safety/judgement;Problem solving;Following commands;Attention                   Current Attention Level: Focused Memory: Decreased short-term memory;Decreased recall of precautions Following Commands: Follows one step commands consistently;Follows one step commands with increased time Safety/Judgement: Decreased awareness of safety;Decreased awareness of deficits Awareness: Intellectual Problem Solving: Slow processing;Difficulty sequencing;Decreased initiation;Requires verbal cues General Comments: Pt able to recall she is not supposed to move her L arm s/p pacemaker placement. Educated pt with handout on precautions, used sling throughout to reassure no breaking of precautions. Pt requiring increased time and step-by-step simple commands to sequence all tasks. Poor awareness into her deficits impacting her safety.          Exercises     Shoulder Instructions       General Comments Provided handout and education to pt and her son on pacemaker precautions; HR up to 121 in standing and up to 137 once sitting after stand step transfer    Pertinent Vitals/ Pain       Pain Assessment: Faces Faces Pain Scale: Hurts little more Pain Location: L foot Pain Descriptors / Indicators: Discomfort Pain Intervention(s): Monitored during session;Premedicated before session;Repositioned  Home Living                                          Prior Functioning/Environment              Frequency  Min 2X/week        Progress Toward Goals  OT Goals(current goals can now be found in the care plan section)  Progress towards OT goals: Progressing toward goals  Acute Rehab OT Goals Patient Stated Goal: get better and go home when ready OT Goal Formulation: With patient Time For Goal Achievement: 05/20/21 Potential to Achieve Goals: Good ADL Goals Pt Will Perform Grooming: with supervision;standing Pt Will Perform Lower Body  Dressing: with min guard assist;sit to/from stand;with adaptive equipment Pt Will Transfer to Toilet: with min guard assist;stand pivot transfer;bedside commode Pt Will Perform Toileting - Clothing Manipulation and hygiene: with min guard assist;sit to/from stand;sitting/lateral leans Additional ADL Goal #1: Pt to verbalize pacemaker precautions with no verbal cues in order to maximize healing/safety with daily tasks  Plan Discharge plan remains appropriate    Co-evaluation    PT/OT/SLP Co-Evaluation/Treatment: Yes Reason for Co-Treatment: Necessary to address cognition/behavior during functional activity;For patient/therapist safety;To address functional/ADL transfers PT goals addressed during session: Mobility/safety with mobility;Balance;Proper use of DME OT goals addressed during session: ADL's and self-care      AM-PAC OT "6 Clicks" Daily Activity     Outcome Measure   Help from another person eating meals?: A Little Help from another person taking care of personal grooming?: A Little Help from another person toileting, which includes using toliet, bedpan, or urinal?: A Lot Help from another person bathing (including washing, rinsing, drying)?: A Lot Help from another person to put on and taking off regular upper body clothing?: A Little Help from another person to put  on and taking off regular lower body clothing?: A Lot 6 Click Score: 15    End of Session Equipment Utilized During Treatment: Gait belt;Rolling walker (2 wheels);Other (comment) (LUE sling, right side of rw)  OT Visit Diagnosis: Unsteadiness on feet (R26.81);Other abnormalities of gait and mobility (R26.89);Muscle weakness (generalized) (M62.81);Pain Pain - Right/Left: Left Pain - part of body: Leg;Ankle and joints of foot   Activity Tolerance Patient limited by fatigue;Other (comment) (HR up to 120 upon standing)   Patient Left in chair;with call bell/phone within reach;with chair alarm set;with  family/visitor present;Other (comment) (son)   Nurse Communication Other (comment) (NT: needs chair alarm pad, son present currently)        Time: [phone] OT Time Calculation (min): 25 min  Charges: OT General Charges $OT Visit: 1 Visit OT Treatments $Self Care/Home Management : 8-22 mins  Tyrone Schimke, [place] Pager: [phone] Office: [phone]   Hortencia Pilar 05/08/2021, 12:30 PM

## 2021-05-08 NOTE — Progress Notes (Signed)
Electrophysiology Rounding Note  Patient Name: Kayla [name] Date of Encounter: 05/08/2021  Primary Cardiologist: Fransico Him, MD Electrophysiologist: Vickie Epley, MD   Subjective   The patient is doing well today.  At this time, the patient denies chest pain, shortness of breath, or any new concerns.  Inpatient Medications    Scheduled Meds:  aspirin EC  81 mg Oral Daily   atorvastatin  20 mg Oral QHS   feeding supplement  237 mL Oral TID BM   guaiFENesin  600 mg Oral BID   hydrALAZINE  50 mg Oral BID   insulin aspart  0-9 Units Subcutaneous TID WC   isosorbide mononitrate  30 mg Oral Daily   levETIRAcetam  500 mg Oral QHS   mometasone-formoterol  2 puff Inhalation BID   multivitamin with minerals  1 tablet Oral Daily   vitamin B-12  2,500 mcg Oral Daily   Continuous Infusions:  lactated ringers 40 mL/hr at 05/07/21 2218   PRN Meds: acetaminophen, acetaminophen, albuterol, guaiFENesin-dextromethorphan, hydrALAZINE, nitroGLYCERIN, ondansetron (ZOFRAN) IV   Vital Signs    Vitals:   05/08/21 0005 05/08/21 0205 05/08/21 0404 05/08/21 0548  BP: (!) 128/94 (!) 169/84  (!) 157/95  Pulse: (!) 105 (!) 109 92 (!) 103  Resp: (!) 25 13 14  (!) 22  Temp: 98.1 F (36.7 C) 98.2 F (36.8 C)  97.8 F (36.6 C)  TempSrc: Oral   Oral  SpO2: 100% 100% 100% 100%  Weight:    77.1 kg  Height:        Intake/Output Summary (Last 24 hours) at 05/08/2021 0710 Last data filed at 05/08/2021 0548 Gross per 24 hour  Intake 330.65 ml  Output 1525 ml  Net -1194.35 ml   Filed Weights   05/07/21 0334 05/07/21 2206 05/08/21 0548  Weight: 76.7 kg 76.8 kg 77.1 kg    Physical Exam    GEN- The patient is well appearing, alert and oriented x 3 today.   Head- normocephalic, atraumatic Eyes-  Sclera clear, conjunctiva pink Ears- hearing intact Oropharynx- clear Neck- supple Lungs- Clear to ausculation bilaterally, normal work of breathing Heart- Regular but slightly tachy rate  and rhythm, no murmurs, rubs or gallops GI- soft, NT, ND, + BS Extremities- no clubbing or cyanosis. No edema Skin- no rash or lesion Psych- euthymic mood, full affect Neuro- strength and sensation are intact  Labs    CBC Recent Labs    05/06/21 0043 05/07/21 0311  WBC 6.4 7.3  HGB 12.7 12.0  HCT 38.9 38.3  MCV 89.2 91.4  PLT 199 169   Basic Metabolic Panel Recent Labs    05/06/21 0043 05/07/21 0311  NA 139 136  K 3.2* 4.5  CL 102 103  CO2 27 25  GLUCOSE 126* 175*  BUN 27* 26*  CREATININE 1.79* 1.59*  CALCIUM 7.9* 8.3*  PHOS  --  2.5   Liver Function Tests Recent Labs    05/07/21 0311  ALBUMIN 2.5*   No results for input(s): LIPASE, AMYLASE in the last 72 hours. Cardiac Enzymes No results for input(s): CKTOTAL, CKMB, CKMBINDEX, TROPONINI in the last 72 hours.   Telemetry    Sinus tach with V pacing 100s (personally reviewed)  Radiology    No results found.  Patient Profile     Kayla [name] is a 80 y.o. female with a history of CAD s/p PCI, poorly controlled DM2, CKD III, Breast CA, GERD, Sickle cell trait, h/o CVA, recent COVID infection (04/19/21) and ?  Medical non compliance who is being seen for the evaluation of bradycardia at the request of Dr. Bonner Puna.  Assessment & Plan    Intermittent CHB S/p Biotronik DDD PPM 11/17 by Dr. Quentin Ore CXR looks stable Device interrogation pending.  Wound care and arm restrictions reviewed with patient and placed in AVS  2. AKI Had resolved by labs yesterday.  Suspect in setting of CHB.   EP to see as needed while here. [place] for home from EP perspective.   For questions or updates, please contact [place] Please consult [email or web address] for contact info under Cardiology/STEMI.  Signed, Shirley Friar, PA-C  05/08/2021, 7:10 AM

## 2021-05-08 NOTE — Progress Notes (Signed)
Physical Therapy Treatment Patient Details Name: Kayla [name] MRN: [id number] DOB: 1941-06-13 Today's Date: 05/08/2021   History of Present Illness Pt adm 11/13 with 3rd degree heart block requiring temporary pacer. S/p PPM placement 11/17.Pt with recent adm 11/9-11/10 for heart block and with recent Covid (10/30). PMH - CAD s/p stent, DM, CKD, CVA, seizures    PT Comments    Treated pt in conjunction with OT to maximize pt safety and quality of session with plans to progress gait distance. However, pt limited in progression of mobility due to L foot pain and elevated HR up to 137 bpm at max this session. Pt displays deficits in memory, problem-solving, and sequencing, requiring continual cuing for all functional mobility. In addition, her lower extremity strength and balance deficits limit her ability to stand upright, requiring modAx2 for stand step transfers with 1 UE support. Pt is at high risk for falls. Provided handout and education on pacemaker precautions. Will continue to follow acutely. Current recommendations remain appropriate.    Recommendations for follow up therapy are one component of a multi-disciplinary discharge planning process, led by the attending physician.  Recommendations may be updated based on patient status, additional functional criteria and insurance authorization.  Follow Up Recommendations  Skilled nursing-short term rehab (<3 hours/day)     Assistance Recommended at Discharge Frequent or constant Supervision/Assistance  Equipment Recommendations  Wheelchair (measurements PT)    Recommendations for Other Services       Precautions / Restrictions Precautions Precautions: Fall;ICD/Pacemaker Precaution Comments: PPM with L arm restrictions Required Braces or Orthoses: Sling (L UE) Restrictions Weight Bearing Restrictions: Yes LUE Weight Bearing:  (pacemaker precautions) Other Position/Activity Restrictions: pacemaker precautions     Mobility  Bed  Mobility Overal bed mobility: Needs Assistance Bed Mobility: Supine to Sit     Supine to sit: Min assist;HOB elevated     General bed mobility comments: Cues provided to bring legs off R EOB, needing extra time and repetition to complete. Cues provided to use bed rail with R UE and then ascend trunk pushing up from R elbow > hand, minA to complete and scoot hips to EOB.    Transfers Overall transfer level: Needs assistance Equipment used: Rolling walker (2 wheels) Transfers: Sit to/from Stand;Bed to chair/wheelchair/BSC Sit to Stand: +2 physical assistance;Min assist;+2 safety/equipment     Step pivot transfers: Mod assist;+2 physical assistance;+2 safety/equipment     General transfer comment: Pt initiating sit to stand from EOB with minA, but requiring minAx2 to complete through cuing pt on hand placement, transition of R hand bed > RW, and extending hips and knees to stand upright. ModAx2 to steady, manage RW, and cue weight shifting to stand step transfer to R bed > recliner with R UE on RW only and L arm in sling.    Ambulation/Gait               General Gait Details: Pt limited to only transfers due to foot pain and HR elevation.   Stairs             Wheelchair Mobility    Modified Rankin (Stroke Patients Only)       Balance Overall balance assessment: Needs assistance Sitting-balance support: No upper extremity supported;Feet supported Sitting balance-Leahy Scale: Fair     Standing balance support: Reliant on assistive device for balance;Single extremity supported Standing balance-Leahy Scale: Poor Standing balance comment: Reliant on 1 UE support and modAx2 for stanidng mobility.  Cognition Arousal/Alertness: Awake/alert Behavior During Therapy: WFL for tasks assessed/performed Overall Cognitive Status: Impaired/Different from baseline Area of Impairment: Memory;Awareness;Safety/judgement;Problem  solving;Following commands;Attention                   Current Attention Level: Focused Memory: Decreased short-term memory Following Commands: Follows one step commands consistently;Follows one step commands with increased time Safety/Judgement: Decreased awareness of safety;Decreased awareness of deficits Awareness: Intellectual Problem Solving: Slow processing;Difficulty sequencing;Decreased initiation;Requires verbal cues General Comments: Pt able to recall she is not supposed to move her L arm s/p pacemaker placement. Educated pt with handout on precautions, used sling throughout to reassure no breaking of precautions. Pt requiring increased time and step-by-step simple commands to sequence all tasks. Poor awareness into her deficits impacting her safety.        Exercises      General Comments General comments (skin integrity, edema, etc.): Provided handout and education to pt and her son on pacemaker precautions; HR up to 121 in standing and up to 137 once sitting after stand step transfer      Pertinent Vitals/Pain Pain Assessment: Faces Faces Pain Scale: Hurts little more Pain Location: L foot Pain Descriptors / Indicators: Discomfort Pain Intervention(s): Monitored during session;Limited activity within patient's tolerance;Repositioned    Home Living                          Prior Function            PT Goals (current goals can now be found in the care plan section) Acute Rehab PT Goals Patient Stated Goal: return home PT Goal Formulation: With patient/family Time For Goal Achievement: 05/19/21 Potential to Achieve Goals: Fair Progress towards PT goals: Progressing toward goals    Frequency    Min 2X/week      PT Plan Frequency needs to be updated    Co-evaluation PT/OT/SLP Co-Evaluation/Treatment: Yes Reason for Co-Treatment: Necessary to address cognition/behavior during functional activity;For patient/therapist safety;To address  functional/ADL transfers PT goals addressed during session: Mobility/safety with mobility;Balance;Proper use of DME        AM-PAC PT "6 Clicks" Mobility   Outcome Measure  Help needed turning from your back to your side while in a flat bed without using bedrails?: A Little Help needed moving from lying on your back to sitting on the side of a flat bed without using bedrails?: A Lot (mod-max cues) Help needed moving to and from a bed to a chair (including a wheelchair)?: Total Help needed standing up from a chair using your arms (e.g., wheelchair or bedside chair)?: Total Help needed to walk in hospital room?: Total Help needed climbing 3-5 steps with a railing? : Total 6 Click Score: 9    End of Session Equipment Utilized During Treatment: Other (comment) (sling) Activity Tolerance: Patient limited by fatigue;Patient limited by pain Patient left: with call bell/phone within reach;with family/visitor present;in chair Nurse Communication: Mobility status PT Visit Diagnosis: Unsteadiness on feet (R26.81);Other abnormalities of gait and mobility (R26.89);Muscle weakness (generalized) (M62.81);Difficulty in walking, not elsewhere classified (R26.2);Pain Pain - Right/Left: Left Pain - part of body: Ankle and joints of foot     Time: 1030-1053 PT Time Calculation (min) (ACUTE ONLY): 23 min  Charges:  $Therapeutic Activity: 8-22 mins                     Moishe Spice, PT, DPT Acute Rehabilitation Services  Pager: [phone] Office: [phone]    Marygrace Drought  M Pettis 05/08/2021, 11:10 AM

## 2021-05-08 NOTE — Discharge Instructions (Signed)
After Your Pacemaker   You have a Biotronik Pacemaker  ACTIVITY Do not lift your arm above shoulder height for 1 week after your procedure. After 7 days, you may progress as below.  You should remove your sling 24 hours after your procedure, unless otherwise instructed by your provider.     Friday May 15, 2021  Saturday May 16, 2021 Sunday May 17, 2021 Monday May 18, 2021   Do not lift, push, pull, or carry anything over 10 pounds with the affected arm until 6 weeks (Friday June 19, 2021 ) after your procedure.   You may drive AFTER your wound check, unless you have been told otherwise by your provider.   Ask your healthcare provider when you can go back to work   INCISION/Dressing If you are on a blood thinner such as Coumadin, Xarelto, Eliquis, Plavix, or Pradaxa please confirm with your provider when this should be resumed.   If large square, outer bandage is left in place, this can be removed after 24 hours from your procedure. Do not remove steri-strips or glue as below.   Monitor your Pacemaker site for redness, swelling, and drainage. Call the device clinic at [phone] if you experience these symptoms or fever/chills.  If your incision is sealed with Steri-strips or staples, you may shower 10 days after your procedure or when told by your provider. Do not remove the steri-strips or let the shower hit directly on your site. You may wash around your site with soap and water.    If you were discharged in a sling, please do not wear this during the day more than 48 hours after your surgery unless otherwise instructed. This may increase the risk of stiffness and soreness in your shoulder.   Avoid lotions, ointments, or perfumes over your incision until it is well-healed.  You may use a hot tub or a pool AFTER your wound check appointment if the incision is completely closed.  PAcemaker Alerts:  Some alerts are vibratory and others beep. These are NOT  emergencies. Please call our office to let us know. If this occurs at night or on weekends, it can wait until the next business day. Send a remote transmission.  If your device is capable of reading fluid status (for heart failure), you will be offered monthly monitoring to review this with you.   DEVICE MANAGEMENT Remote monitoring is used to monitor your pacemaker from home. This monitoring is scheduled every 91 days by our office. It allows [place] to keep an eye on the functioning of your device to ensure it is working properly. You will routinely see your Electrophysiologist annually (more often if necessary).   You should receive your ID card for your new device in 4-8 weeks. Keep this card with you at all times once received. Consider wearing a medical alert bracelet or necklace.  Your Pacemaker may be MRI compatible. This will be discussed at your next office visit/wound check.  You should avoid contact with strong electric or magnetic fields.   Do not use amateur (ham) radio equipment or electric (arc) welding torches. MP3 player headphones with magnets should not be used. Some devices are safe to use if held at least 12 inches (30 cm) from your Pacemaker. These include power tools, lawn mowers, and speakers. If you are unsure if something is safe to use, ask your health care provider.  When using your cell phone, hold it to the ear that is on the opposite side from the  Pacemaker. Do not leave your cell phone in a pocket over the Pacemaker.  You may safely use electric blankets, heating pads, computers, and microwave ovens.  Call the office right away if: You have chest pain. You feel more short of breath than you have felt before. You feel more light-headed than you have felt before. Your incision starts to open up.  This information is not intended to replace advice given to you by your health care provider. Make sure you discuss any questions you have with your health care provider.

## 2021-05-08 NOTE — Progress Notes (Addendum)
Re-reviewed wound care and arm restrictions with son and pt  Outer square bandage should be removed from PPM site prior to discharge.   Steri-strips should remain.   Per Dr. Quentin Ore can resume Plavix Tuesday, 05/12/2021. She was not on ASA from a cardiac perspective.   Legrand Como [address]" Gerald[place], PA-C  05/08/2021 2:50 PM

## 2021-05-08 NOTE — Progress Notes (Addendum)
PROGRESS NOTE   Kayla [name]  SWF:[id number]    DOB: 1941-03-09    DOA: 05/03/2021  PCP: Cipriano Mile, NP   I have briefly reviewed patients previous medical records in [facility].  Chief complaint: Slow heart rate  Brief Narrative:  80 year old female with medical history significant for CKD stage III, type II DM, prior stroke, CAD s/p PCI, recent COVID infection (04/19/2021), recently diagnosed second-degree heart block with 2-1 conduction that improved after discontinuation of beta-blockers during recent hospitalization 11/9-11/11, presented 11/13 after becoming lethargic and confused and patient was noted with heart rates at home in the 33s.  She returned to the ER in complete heart block with acute kidney injury and hyperkalemia.  Cardiac conduction improved after treatment of hyperkalemia but then patient developed progressive acidosis, heart block and underwent emergent TVP placement and transferred to ICU under PCCM care.  Stabilized and transferred to telemetry and TRH on 11/18.   Assessment & Plan:  Principal Problem:   Bradycardia Active Problems:   Essential hypertension   Type 2 diabetes mellitus with diabetic neuropathy (HCC)   Seizure as late effect of cerebrovascular accident (CVA) ([place])   Vascular dementia ([place])   COVID-19 virus infection   Complete heart block ([place])   ARF (acute renal failure) (HCC)   Cardiogenic shock, bradycardia due to CHB> resolved with TVP Hx of 2:1 AV block that had improved after Bblocker discontinuation during recent hospitalization, now progressed to CHB intermittently TSH WNL -Initially had emergent temporary venous pacemaker placement per cardiology on 11/14.  Now s/p Biotronik DDD PPM 11/17 by Dr. Quentin Ore. - EP cardiology follow-up appreciated.  Chest x-ray looks okay.  Wound care and arm restrictions were reviewed with patient and son.  Device interrogated and stable.  EP cardiology signed off 11/18 and to follow-up as  outpatient.  As per my discussion with Mr. Chalmers Cater, [place] [facility] PA-C-current tachycardia is patient's intrinsic heart rate.  Her beta-blockers were just resumed and this can be uptitrated if needed.   History of CAD s/p PCI, HTN -Continue daily aspirin and statin.  Continue Imdur -Metoprolol 25 Mg twice daily has been resumed now that she has a PPM.  Continue hydralazine and Imdur. - Losartan held due to AKI and can be resumed pending further stabilization of renal functions. - Hydralazine as needed - Patient was on Plavix prior to admission but now is on aspirin.  I discussed with Mr. Chalmers Cater, EP Cardiology PA-C who indicated that the Plavix was held for pacemaker placement.  He will verify with attending and indicate timing of resuming Plavix and stopping aspirin.   AGMA (resolved), suspect lactic acidosis AKI nonoliguric AKI complicating CKD stage IIIb, with hyperkalemia> hyperkalemia resolved -Likely multifactorial due to complete heart block with shock, HCTZ, ACEI use, volume depletion from poor oral intake and likely prerenal/ischemic ATN - Improved after IV fluids and holding ACEI/HCTZ. - Renal ultrasound without obstruction. - Nephrology consulted and signed off 11/17. - Renally dose meds, avoid nephrotoxic meds - Continue to hold ACE inhibitor and her chlorothiazide until renal function is back to normal or plateaus. -Electrolyte repletion as needed - Baseline creatinine probably between 1.4-1.5.  Creatinine has now improved to 1.49.  We will turn off IV fluids and follow BMP in AM.   Hypokalemia, resolved -continue to monitor daily BMP - HCTZ and ARB on hold.   Acute metabolic encephalopathy due to poor perfusion, renal failure> resolved Suspect there is likely underlying some cognitive impairment and may also be hard  of hearing. -Family visitation helps   Recent COVID infection, ongoing cough Positive 10/30, out of window for isolation -Continue Dulera twice daily.   Albuterol as needed.  Push to be continued until her symptoms resolved.  Okay to stop about 2 weeks after symptoms resolved, sooner if she has intolerance as this is mostly symptomatic management.  These are recommendations by PCCM. -mucinex BID - Ongoing bothersome cough, added Robitussin-DM.   DM, uncontrolled, A1c 8.7 -Sliding scale insulin as needed -Continue to hold home glipizide and Jardiance - CBGs reasonably controlled with occasional highs.   Hyperbilirubinemia, likely related to heart block -no additional monitoring required   H/o CVA, seizures -Continue PTA Keppra, statins, aspirin.  Hyperlipidemia - Continue statins.  Deconditioning - Therapies recommend SNF.  Patient has an SNF bed.  Insurance authorization is still pending.  Constipation - Initiated bowel regimen.   Toe pain-- no erythema, warmth, allodynia to suggest gout. Uric acid WNL. -Acetaminophen    Body mass index is 27.43 kg/m.  Nutritional Status Nutrition Problem: Moderate Malnutrition Etiology: chronic illness (uncontrolled T2DM, CKD3) Signs/Symptoms: mild fat depletion, moderate muscle depletion Interventions: Ensure Enlive (each supplement provides 350kcal and 20 grams of protein), MVI    DVT prophylaxis:   Add heparin subcu   Code Status: Full Code Family Communication: Discussed with son at bedside. Disposition:  Status is: Inpatient  Remains inpatient appropriate because: Ongoing bothersome cough, medication adjustment, AKI on gentle IV fluid hydration with close monitoring of BMP.        Consultants:   PCCM EP cardiology Nephrology.  Procedures:   S/p Biotronik DDD PPM 11/17 by Dr. Quentin Ore  Antimicrobials:   None   Subjective:  Seen this morning.  Son at bedside.  Reports bothersome cough with occasional gray-colored phlegm.  No dyspnea or pain reported.  Objective:   Vitals:   05/08/21 1141 05/08/21 1142 05/08/21 1148 05/08/21 1410  BP: (!) 142/84 (!) 142/84 (!)  142/84 (!) 146/94  Pulse: (!) 116 (!) 119 (!) 119 (!) 120  Resp: (!) 30 18 18    Temp: 98 F (36.7 C) 98 F (36.7 C) 98 F (36.7 C)   TempSrc: Oral     SpO2: 100%  100%   Weight:      Height:        General exam: Elderly female, moderately built and nourished lying comfortably propped up in bed without distress. Respiratory system: Clear to auscultation. Respiratory effort normal.  Dressing over left upper anterior chest pacemaker clean and dry. Cardiovascular system: S1 & S2 heard, regular tachycardia. No JVD, murmurs, rubs, gallops or clicks. No pedal edema.  Telemetry personally reviewed: V paced rhythm in the 110s. Gastrointestinal system: Abdomen is nondistended, soft and nontender. No organomegaly or masses felt. Normal bowel sounds heard. Central nervous system: Alert and oriented x2. No focal neurological deficits. Extremities: Symmetric 5 x 5 power.  Left upper extremity in sling immobilization due to recent PPM placement. Skin: No rashes, lesions or ulcers Psychiatry: Judgement and insight appear impaired but this may also be due to hard of hearing. Mood & affect appropriate.     Data Reviewed:   I have personally reviewed following labs and imaging studies   CBC: Recent Labs  Lab 05/04/21 0353 05/06/21 0043 05/07/21 0311  WBC 6.0 6.4 7.3  NEUTROABS 3.4  --   --   HGB 14.7 12.7 12.0  HCT 46.7* 38.9 38.3  MCV 91.6 89.2 91.4  PLT 234 199 588    Basic Metabolic Panel:  Recent Labs  Lab 05/03/21 2035 05/04/21 0353 05/04/21 1202 05/04/21 2246 05/05/21 0430 05/06/21 0043 05/07/21 0311 05/08/21 0747  NA 139 139 140 137 138 139 136 134*  K 5.5* 5.3* 3.9 3.4* 3.3* 3.2* 4.5 4.8  CL 109 111 109 102 99 102 103 103  CO2 17* 14* 20* 25 28 27 25  21*  GLUCOSE 152* 138* 188* 185* 186* 126* 175* 135*  BUN 55* 55* 47* 47* 39* 27* 26* 23  CREATININE 3.72* 3.51* 2.81* 2.56* 2.19* 1.79* 1.59* 1.49*  CALCIUM 9.1 8.7* 8.5* 8.1* 8.0* 7.9* 8.3* 8.8*  MG 2.4 2.5*  --   --    --   --   --   --   PHOS  --   --  4.2 3.5 3.2  --  2.5  --     Liver Function Tests: Recent Labs  Lab 05/04/21 0353 05/04/21 1202 05/04/21 2246 05/05/21 0430 05/07/21 0311  AST 30  --   --   --   --   ALT 18  --   --   --   --   ALKPHOS 59  --   --   --   --   BILITOT 1.4*  --   --   --   --   PROT 6.4*  --   --   --   --   ALBUMIN 3.0* 2.8* 2.7* 2.6* 2.5*    CBG: Recent Labs  Lab 05/07/21 2105 05/08/21 0801 05/08/21 1140  GLUCAP 173* 136* 223*    Microbiology Studies:   Recent Results (from the past 240 hour(s))  Resp Panel by RT-PCR (Flu A&B, Covid) Nasopharyngeal Swab     Status: Abnormal   Collection Time: 04/29/21  3:16 PM   Specimen: Nasopharyngeal Swab; Nasopharyngeal(NP) swabs in vial transport medium  Result Value Ref Range Status   SARS Coronavirus 2 by RT PCR POSITIVE (A) NEGATIVE Final    Comment: RESULT CALLED TO, READ BACK BY AND VERIFIED WITH: CHRIS CRISCO RN 04/29/2021 @1846  BY JW (NOTE) SARS-CoV-2 target nucleic acids are DETECTED.  The SARS-CoV-2 RNA is generally detectable in upper respiratory specimens during the acute phase of infection. Positive results are indicative of the presence of the identified virus, but do not rule out bacterial infection or co-infection with other pathogens not detected by the test. Clinical correlation with patient history and other diagnostic information is necessary to determine patient infection status. The expected result is Negative.  Fact Sheet for Patients: EntrepreneurPulse.com.au  Fact Sheet for Healthcare Providers: IncredibleEmployment.be  This test is not yet approved or cleared by the [place] FDA and  has been authorized for detection and/or diagnosis of SARS-CoV-2 by FDA under an Emergency Use Authorization (EUA).  This EUA will remain in effect (meaning this test ca n be used) for the duration of  the COVID-19 declaration under Section 564(b)(1) of the  Act, 21 U.S.C. section 360bbb-3(b)(1), unless the authorization is terminated or revoked sooner.     Influenza A by PCR NEGATIVE NEGATIVE Final   Influenza B by PCR NEGATIVE NEGATIVE Final    Comment: (NOTE) The Xpert Xpress SARS-CoV-2/FLU/RSV plus assay is intended as an aid in the diagnosis of influenza from Nasopharyngeal swab specimens and should not be used as a sole basis for treatment. Nasal washings and aspirates are unacceptable for Xpert Xpress SARS-CoV-2/FLU/RSV testing.  Fact Sheet for Patients: EntrepreneurPulse.com.au  Fact Sheet for Healthcare Providers: IncredibleEmployment.be  This test is not yet approved or cleared by the  [place] Architectural technologist and has been authorized for detection and/or diagnosis of SARS-CoV-2 by FDA under an Print production planner (EUA). This EUA will remain in effect (meaning this test can be used) for the duration of the COVID-19 declaration under Section 564(b)(1) of the Act, 21 U.S.C. section 360bbb-3(b)(1), unless the authorization is terminated or revoked.  Performed at [facility], [place] [address] [place], [place] [address]   Resp Panel by RT-PCR (Flu A&B, Covid) Nasopharyngeal Swab     Status: Abnormal   Collection Time: 05/03/21 10:23 PM   Specimen: Nasopharyngeal Swab; Nasopharyngeal(NP) swabs in vial transport medium  Result Value Ref Range Status   SARS Coronavirus 2 by RT PCR POSITIVE (A) NEGATIVE Final    Comment: RESULT CALLED TO, READ BACK BY AND VERIFIED WITH: C CRISCOE,RN@0005  05/04/21 [place] (NOTE) SARS-CoV-2 target nucleic acids are DETECTED.  The SARS-CoV-2 RNA is generally detectable in upper respiratory specimens during the acute phase of infection. Positive results are indicative of the presence of the identified virus, but do not rule out bacterial infection or co-infection with other pathogens not detected by the test. Clinical correlation with patient history and other  diagnostic information is necessary to determine patient infection status. The expected result is Negative.  Fact Sheet for Patients: EntrepreneurPulse.com.au  Fact Sheet for Healthcare Providers: IncredibleEmployment.be  This test is not yet approved or cleared by the [place] FDA and  has been authorized for detection and/or diagnosis of SARS-CoV-2 by FDA under an Emergency Use Authorization (EUA).  This EUA will remain in effect (meaning this test can be use d) for the duration of  the COVID-19 declaration under Section 564(b)(1) of the Act, 21 U.S.C. section 360bbb-3(b)(1), unless the authorization is terminated or revoked sooner.     Influenza A by PCR NEGATIVE NEGATIVE Final   Influenza B by PCR NEGATIVE NEGATIVE Final    Comment: (NOTE) The Xpert Xpress SARS-CoV-2/FLU/RSV plus assay is intended as an aid in the diagnosis of influenza from Nasopharyngeal swab specimens and should not be used as a sole basis for treatment. Nasal washings and aspirates are unacceptable for Xpert Xpress SARS-CoV-2/FLU/RSV testing.  Fact Sheet for Patients: EntrepreneurPulse.com.au  Fact Sheet for Healthcare Providers: IncredibleEmployment.be  This test is not yet approved or cleared by the [place] FDA and has been authorized for detection and/or diagnosis of SARS-CoV-2 by FDA under an Emergency Use Authorization (EUA). This EUA will remain in effect (meaning this test can be used) for the duration of the COVID-19 declaration under Section 564(b)(1) of the Act, 21 U.S.C. section 360bbb-3(b)(1), unless the authorization is terminated or revoked.  Performed at [facility], [place] [address], [place], [place] [address]   Surgical PCR screen     Status: None   Collection Time: 05/04/21  9:32 AM   Specimen: Nasal Mucosa; Nasal Swab  Result Value Ref Range Status   MRSA, PCR NEGATIVE NEGATIVE Final    Staphylococcus aureus NEGATIVE NEGATIVE Final    Comment: (NOTE) The Xpert SA Assay (FDA approved for NASAL specimens in patients 86 years of age and older), is one component of a comprehensive surveillance program. It is not intended to diagnose infection nor to guide or monitor treatment. Performed at [facility], [place] [address] [place], [place] [address]   Surgical PCR screen     Status: None   Collection Time: 05/06/21  8:26 PM   Specimen: Nasal Mucosa; Nasal Swab  Result Value Ref Range Status  MRSA, PCR NEGATIVE NEGATIVE Final   Staphylococcus aureus NEGATIVE NEGATIVE Final    Comment: (NOTE) The Xpert SA Assay (FDA approved for NASAL specimens in patients 39 years of age and older), is one component of a comprehensive surveillance program. It is not intended to diagnose infection nor to guide or monitor treatment. Performed at [facility], [place] [address] [place], [place] [address]     Radiology Studies:  DG Chest 2 View  Result Date: 05/08/2021 CLINICAL DATA:  Pacemaker plate asthma EXAM: CHEST - 2 VIEW COMPARISON:  Chest radiograph 05/06/2021 FINDINGS: There is a new left chest wall cardiac device with 2 associated leads terminating over the right atrium and right ventricle. The cardiomediastinal silhouette is stable. There is no focal consolidation or pulmonary edema. There is no pleural effusion or pneumothorax. There is no acute osseous abnormality. IMPRESSION: Interval left chest wall pacemaker placement without evidence of complication. Electronically Signed   By: Valetta Mole M.D.   On: 05/08/2021 08:56    Scheduled Meds:    aspirin EC  81 mg Oral Daily   atorvastatin  20 mg Oral QHS   feeding supplement  237 mL Oral TID BM   guaiFENesin  600 mg Oral BID   hydrALAZINE  50 mg Oral BID   insulin aspart  0-9 Units Subcutaneous TID WC   isosorbide mononitrate  30 mg Oral Daily   levETIRAcetam  500 mg Oral QHS   metoprolol tartrate  25 mg Oral BID    mometasone-formoterol  2 puff Inhalation BID   multivitamin with minerals  1 tablet Oral Daily   vitamin B-12  2,500 mcg Oral Daily    Continuous Infusions:    lactated ringers 40 mL/hr at 05/07/21 2218     LOS: 4 days     Vernell Leep, MD,  FACP, [facility], [facility], [facility] (Care Management Physician Certified) [place]  To contact the attending provider between 7A-7P or the covering provider during after hours 7P-7A, please log into the web site [email or web address] and access using universal [place] password for that web site. If you do not have the password, please call the hospital operator.  05/08/2021, 3:32 PM

## 2021-05-09 DIAGNOSIS — F0150 Vascular dementia without behavioral disturbance: Secondary | ICD-10-CM

## 2021-05-09 LAB — BASIC METABOLIC PANEL
Anion gap: 10 (ref 5–15)
BUN: 30 mg/dL — ABNORMAL HIGH (ref 8–23)
CO2: 24 mmol/L (ref 22–32)
Calcium: 9 mg/dL (ref 8.9–10.3)
Chloride: 99 mmol/L (ref 98–111)
Creatinine, Ser: 1.4 mg/dL — ABNORMAL HIGH (ref 0.44–1.00)
GFR, Estimated: 38 mL/min — ABNORMAL LOW (ref >60)
Glucose, Bld: 169 mg/dL — ABNORMAL HIGH (ref 70–99)
Potassium: 4.7 mmol/L (ref 3.5–5.1)
Sodium: 133 mmol/L — ABNORMAL LOW (ref 135–145)

## 2021-05-09 LAB — GLUCOSE, CAPILLARY
Glucose-Capillary: 184 mg/dL — ABNORMAL HIGH (ref 70–99)
Glucose-Capillary: 333 mg/dL — ABNORMAL HIGH (ref 70–99)
Glucose-Capillary: 169 mg/dL — ABNORMAL HIGH (ref 70–99)
Glucose-Capillary: 180 mg/dL — ABNORMAL HIGH (ref 70–99)

## 2021-05-09 NOTE — TOC Progression Note (Signed)
Transition of Care [facility]) - Progression Note    Patient Details  Name: Kayla [name] MRN: [id number] Date of Birth: 10-22-1940  Transition of Care [facility]) CM/SW [place], LCSW Phone Number:[phone]  05/09/2021, 2:14 PM  Clinical Narrative:     CSW spoke with Juliann Pulse at [facility] to ascertain if the authorization had come back. Juliann Pulse stated that she would check and give CSW a call back.  TOC team will continue to assist with discharge planning needs.    Expected Discharge Plan: [place] Barriers to Discharge: Continued Medical Work up  Expected Discharge Plan and Services Expected Discharge Plan: [place] arrangements for the past 2 months: Single Family Home Expected Discharge Date: 05/09/21                                     Social Determinants of Health (SDOH) Interventions    Readmission Risk Interventions Readmission Risk Prevention Plan 05/01/2021  Transportation Screening Complete  PCP or Specialist Appt within 5-7 Days Complete  Home Care Screening Complete  Medication Review (RN CM) Complete  Some recent data might be hidden

## 2021-05-09 NOTE — Progress Notes (Signed)
PROGRESS NOTE  Kayla [name]  DJM:[id number] DOB: Oct 19, 1940 DOA: 05/03/2021 PCP: Cipriano Mile, NP   Brief Narrative: Kayla [name] is an 80 y.o. female with a history of CAD s/p stenting D1, and LAD July 2016, T2DM (HbA1c 8.7%), HTN, HLD, CVA complicated by seizure disorder, breast CA, sickle cell trait, and covid-19 infection diagnosed 04/19/2021 who was recently admitted 11/9 - 11/11 for 2:1 2nd degree AVB in the setting of poor oral intake, fatigue, weakness. Atenolol was held with improvement in HR. Therefore, cardiology did not recommend pacemaker insertion at that time, and she was discharged with home health services at her functional baseline having shown improved oral intake. At home, she did not continue adequate oral intake, and grew more lethargic. Her son found her HR to be in 46's on 11/13, called EMS who recorded CHB on ECG and brought her to the ED where she was found to have hyperkalemia, metabolic acidosis, and acute renal failure with creatinine up to 3.72 from 1.56 at recent discharge due to cardiogenic shock. Transvenous pacer was placed on 11/14 and patient admitted to ICU, subsequently undergoing PPM insertion 11/17.  Assessment & Plan: Principal Problem:   Bradycardia Active Problems:   Essential hypertension   Type 2 diabetes mellitus with diabetic neuropathy (HCC)   Seizure as late effect of cerebrovascular accident (CVA) ([place])   Vascular dementia ([place])   COVID-19 virus infection   Complete heart block ([place])   ARF (acute renal failure) (HCC)   Pressure injury of skin  Complete heart block:  - s/p Biotronik DDD PPM 11/17 by Dr. Quentin Ore. - EP cardiology follow-up appreciated.  Chest x-ray looks okay.  Wound care and arm restrictions were reviewed with patient and son.  Device interrogated and stable.  EP cardiology signed off 11/18 and to follow-up as outpatient.   Acute renal failure with hyperkalemia and lactic acidosis on stage IIIb CKD:  - Resolved. Avoid  nephrotoxins and hypotension.   CAD s/p stents 2016 with demand myocardial ischemia, HTN: LBBB on ECG, troponin up to 553 last night, down to 243 this morning. Recent echo with no wall motion abnormalities, preserved LVEF.  - Continue aspirin (plan to restart plavix which is the PTA antiplatelet on 11/22), statin, BB now that pacemaker is in.   T2DM: Poorly controlled with HbA1c 8.7%.  - Follow up with PCP for further management.   Hypokalemia: Resolved.   HLD:  - Continue statin   History of CVA, ensuing seizure disorder:  - Continue antiplatelet, statin, RF modification as above, and AED.    Moderate protein-calorie malnutrition:  - Supplement protein as able  Acute metabolic encephalopathy: Resolved. - Consider outpatient neuropsychiatric battery with concern for some level of cognitive impairment.  Covid-19 infection: Diagnosed 04/19/2021 without evidence of pneumonia, still negative CXR this admission. Well outside window for isolation or treatment. - Symptomatic Tx w/mucinex.   Deconditioning:  - will require SNF level rehabilitation which is pending insurance authorization.   Left great toe pain: Exam is not consistent with any abnormality, currently quiescent, urate wnl.  - Tylenol prn.  DVT prophylaxis: Heparin [place] Code Status: Full Family Communication: None at bedside this AM Disposition Plan:  Status is: Inpatient  Remains inpatient appropriate because: Unsafe DC, requires SNF level of care, planning DC 11/21.  Consultants:  EP/cardiology Nephrology PCCM  Procedures:  S/p Biotronik DDD PPM 11/17 by Dr. Quentin Ore  Antimicrobials: None   Subjective: No chest pain or dyspnea. Says she has chest congestion still which is  unchanged. Confirms her left big toe/foot hurts from time to time over last 48 hours. No swelling, trauma, or history of same. Not currently bothering her.   Objective: Vitals:   05/09/21 0531 05/09/21 0556 05/09/21 0736 05/09/21 1139  BP:  134/66  (!) 144/92 108/60  Pulse: 80 92 93 98  Resp: 20 18 18 18   Temp: 97.8 F (36.6 C)  (!) 96.5 F (35.8 C)   TempSrc: Oral  Axillary   SpO2: 100% 100% 100% 99%  Weight:  75.7 kg    Height:        Intake/Output Summary (Last 24 hours) at 05/09/2021 1329 Last data filed at 05/09/2021 0556 Gross per 24 hour  Intake --  Output 2000 ml  Net -2000 ml   Filed Weights   05/07/21 2206 05/08/21 0548 05/09/21 0556  Weight: 76.8 kg 77.1 kg 75.7 kg    Gen: Elderly female in no distress Pulm: Non-labored breathing room air. Clear to auscultation bilaterally.  CV: Regular paced rhythm. No murmur, rub, or gallop. No JVD, no pitting pedal edema. GI: Abdomen soft, non-tender, non-distended, with normoactive bowel sounds. No organomegaly or masses felt. Ext: Warm, no deformities. Left foot, in particular, appears and feels normal. Skin: No rashes, lesions or ulcers on visualized skin. Neuro: Alert and oriented. No focal neurological deficits. Psych: Judgement and insight appear normal. Mood & affect appropriate.   Data Reviewed: I have personally reviewed following labs and imaging studies  CBC: Recent Labs  Lab 05/03/21 2035 05/04/21 0353 05/06/21 0043 05/07/21 0311  WBC 6.8 6.0 6.4 7.3  NEUTROABS  --  3.4  --   --   HGB 15.9* 14.7 12.7 12.0  HCT 50.5* 46.7* 38.9 38.3  MCV 93.0 91.6 89.2 91.4  PLT 253 234 199 559   Basic Metabolic Panel: Recent Labs  Lab 05/03/21 2035 05/04/21 0353 05/04/21 1202 05/04/21 2246 05/05/21 0430 05/06/21 0043 05/07/21 0311 05/08/21 0747 05/09/21 0158  NA 139 139 140 137 138 139 136 134* 133*  K 5.5* 5.3* 3.9 3.4* 3.3* 3.2* 4.5 4.8 4.7  CL 109 111 109 102 99 102 103 103 99  CO2 17* 14* 20* 25 28 27 25  21* 24  GLUCOSE 152* 138* 188* 185* 186* 126* 175* 135* 169*  BUN 55* 55* 47* 47* 39* 27* 26* 23 30*  CREATININE 3.72* 3.51* 2.81* 2.56* 2.19* 1.79* 1.59* 1.49* 1.40*  CALCIUM 9.1 8.7* 8.5* 8.1* 8.0* 7.9* 8.3* 8.8* 9.0  MG 2.4 2.5*  --    --   --   --   --   --   --   PHOS  --   --  4.2 3.5 3.2  --  2.5  --   --    GFR: Estimated Creatinine Clearance: 32.8 mL/min (A) (by C-G formula based on SCr of 1.4 mg/dL (H)). Liver Function Tests: Recent Labs  Lab 05/04/21 0353 05/04/21 1202 05/04/21 2246 05/05/21 0430 05/07/21 0311  AST 30  --   --   --   --   ALT 18  --   --   --   --   ALKPHOS 59  --   --   --   --   BILITOT 1.4*  --   --   --   --   PROT 6.4*  --   --   --   --   ALBUMIN 3.0* 2.8* 2.7* 2.6* 2.5*   No results for input(s): LIPASE, AMYLASE in the last 168 hours. No  results for input(s): AMMONIA in the last 168 hours. Coagulation Profile: No results for input(s): INR, PROTIME in the last 168 hours. Cardiac Enzymes: No results for input(s): CKTOTAL, CKMB, CKMBINDEX, TROPONINI in the last 168 hours. BNP (last 3 results) No results for input(s): PROBNP in the last 8760 hours. HbA1C: No results for input(s): HGBA1C in the last 72 hours. CBG: Recent Labs  Lab 05/08/21 1140 05/08/21 1652 05/08/21 2117 05/09/21 0733 05/09/21 1137  GLUCAP 223* 232* 159* 184* 333*   Lipid Profile: No results for input(s): CHOL, HDL, LDLCALC, TRIG, CHOLHDL, LDLDIRECT in the last 72 hours. Thyroid Function Tests: No results for input(s): TSH, T4TOTAL, FREET4, T3FREE, THYROIDAB in the last 72 hours. Anemia Panel: No results for input(s): VITAMINB12, FOLATE, FERRITIN, TIBC, IRON, RETICCTPCT in the last 72 hours. Urine analysis:    Component Value Date/Time   COLORURINE YELLOW 05/04/2021 0923   APPEARANCEUR CLEAR 05/04/2021 0923   LABSPEC 1.020 05/04/2021 0923   PHURINE 5.5 05/04/2021 0923   GLUCOSEU >=500 (A) 05/04/2021 0923   GLUCOSEU NEGATIVE 02/25/2016 0949   HGBUR NEGATIVE 05/04/2021 0923   BILIRUBINUR NEGATIVE 05/04/2021 0923   BILIRUBINUR neg 02/03/2017 1132   KETONESUR NEGATIVE 05/04/2021 0923   PROTEINUR NEGATIVE 05/04/2021 0923   UROBILINOGEN 0.2 02/03/2017 1132   UROBILINOGEN 0.2 02/25/2016 0949    NITRITE NEGATIVE 05/04/2021 0923   LEUKOCYTESUR NEGATIVE 05/04/2021 2725   Recent Results (from the past 240 hour(s))  Resp Panel by RT-PCR (Flu A&B, Covid) Nasopharyngeal Swab     Status: Abnormal   Collection Time: 04/29/21  3:16 PM   Specimen: Nasopharyngeal Swab; Nasopharyngeal(NP) swabs in vial transport medium  Result Value Ref Range Status   SARS Coronavirus 2 by RT PCR POSITIVE (A) NEGATIVE Final    Comment: RESULT CALLED TO, READ BACK BY AND VERIFIED WITH: CHRIS CRISCO RN 04/29/2021 @1846  BY JW (NOTE) SARS-CoV-2 target nucleic acids are DETECTED.  The SARS-CoV-2 RNA is generally detectable in upper respiratory specimens during the acute phase of infection. Positive results are indicative of the presence of the identified virus, but do not rule out bacterial infection or co-infection with other pathogens not detected by the test. Clinical correlation with patient history and other diagnostic information is necessary to determine patient infection status. The expected result is Negative.  Fact Sheet for Patients: EntrepreneurPulse.com.au  Fact Sheet for Healthcare Providers: IncredibleEmployment.be  This test is not yet approved or cleared by the [place] FDA and  has been authorized for detection and/or diagnosis of SARS-CoV-2 by FDA under an Emergency Use Authorization (EUA).  This EUA will remain in effect (meaning this test ca n be used) for the duration of  the COVID-19 declaration under Section 564(b)(1) of the Act, 21 U.S.C. section 360bbb-3(b)(1), unless the authorization is terminated or revoked sooner.     Influenza A by PCR NEGATIVE NEGATIVE Final   Influenza B by PCR NEGATIVE NEGATIVE Final    Comment: (NOTE) The Xpert Xpress SARS-CoV-2/FLU/RSV plus assay is intended as an aid in the diagnosis of influenza from Nasopharyngeal swab specimens and should not be used as a sole basis for treatment. Nasal washings  and aspirates are unacceptable for Xpert Xpress SARS-CoV-2/FLU/RSV testing.  Fact Sheet for Patients: EntrepreneurPulse.com.au  Fact Sheet for Healthcare Providers: IncredibleEmployment.be  This test is not yet approved or cleared by the [place] FDA and has been authorized for detection and/or diagnosis of SARS-CoV-2 by FDA under an Emergency Use Authorization (EUA). This EUA will remain in effect (meaning this test  can be used) for the duration of the COVID-19 declaration under Section 564(b)(1) of the Act, 21 U.S.C. section 360bbb-3(b)(1), unless the authorization is terminated or revoked.  Performed at [facility], [place] [address] [place], [place] [address]   Resp Panel by RT-PCR (Flu A&B, Covid) Nasopharyngeal Swab     Status: Abnormal   Collection Time: 05/03/21 10:23 PM   Specimen: Nasopharyngeal Swab; Nasopharyngeal(NP) swabs in vial transport medium  Result Value Ref Range Status   SARS Coronavirus 2 by RT PCR POSITIVE (A) NEGATIVE Final    Comment: RESULT CALLED TO, READ BACK BY AND VERIFIED WITH: C CRISCOE,RN@0005  05/04/21 Ava (NOTE) SARS-CoV-2 target nucleic acids are DETECTED.  The SARS-CoV-2 RNA is generally detectable in upper respiratory specimens during the acute phase of infection. Positive results are indicative of the presence of the identified virus, but do not rule out bacterial infection or co-infection with other pathogens not detected by the test. Clinical correlation with patient history and other diagnostic information is necessary to determine patient infection status. The expected result is Negative.  Fact Sheet for Patients: EntrepreneurPulse.com.au  Fact Sheet for Healthcare Providers: IncredibleEmployment.be  This test is not yet approved or cleared by the [place] FDA and  has been authorized for detection and/or diagnosis of SARS-CoV-2 by FDA under an  Emergency Use Authorization (EUA).  This EUA will remain in effect (meaning this test can be use d) for the duration of  the COVID-19 declaration under Section 564(b)(1) of the Act, 21 U.S.C. section 360bbb-3(b)(1), unless the authorization is terminated or revoked sooner.     Influenza A by PCR NEGATIVE NEGATIVE Final   Influenza B by PCR NEGATIVE NEGATIVE Final    Comment: (NOTE) The Xpert Xpress SARS-CoV-2/FLU/RSV plus assay is intended as an aid in the diagnosis of influenza from Nasopharyngeal swab specimens and should not be used as a sole basis for treatment. Nasal washings and aspirates are unacceptable for Xpert Xpress SARS-CoV-2/FLU/RSV testing.  Fact Sheet for Patients: EntrepreneurPulse.com.au  Fact Sheet for Healthcare Providers: IncredibleEmployment.be  This test is not yet approved or cleared by the [place] FDA and has been authorized for detection and/or diagnosis of SARS-CoV-2 by FDA under an Emergency Use Authorization (EUA). This EUA will remain in effect (meaning this test can be used) for the duration of the COVID-19 declaration under Section 564(b)(1) of the Act, 21 U.S.C. section 360bbb-3(b)(1), unless the authorization is terminated or revoked.  Performed at [facility], [place] [address] [place], [place] [address]   Surgical PCR screen     Status: None   Collection Time: 05/04/21  9:32 AM   Specimen: Nasal Mucosa; Nasal Swab  Result Value Ref Range Status   MRSA, PCR NEGATIVE NEGATIVE Final   Staphylococcus aureus NEGATIVE NEGATIVE Final    Comment: (NOTE) The Xpert SA Assay (FDA approved for NASAL specimens in patients 4 years of age and older), is one component of a comprehensive surveillance program. It is not intended to diagnose infection nor to guide or monitor treatment. Performed at [facility], [place] [address], [place], [place] [address]   Surgical PCR screen     Status: None    Collection Time: 05/06/21  8:26 PM   Specimen: Nasal Mucosa; Nasal Swab  Result Value Ref Range Status   MRSA, PCR NEGATIVE NEGATIVE Final   Staphylococcus aureus NEGATIVE NEGATIVE Final    Comment: (NOTE) The Xpert SA Assay (FDA approved for NASAL specimens in patients 22 years  of age and older), is one component of a comprehensive surveillance program. It is not intended to diagnose infection nor to guide or monitor treatment. Performed at [facility], [place] [address] [place], [place] [address]       Radiology Studies: DG Chest 2 View  Result Date: 05/08/2021 CLINICAL DATA:  Pacemaker plate asthma EXAM: CHEST - 2 VIEW COMPARISON:  Chest radiograph 05/06/2021 FINDINGS: There is a new left chest wall cardiac device with 2 associated leads terminating over the right atrium and right ventricle. The cardiomediastinal silhouette is stable. There is no focal consolidation or pulmonary edema. There is no pleural effusion or pneumothorax. There is no acute osseous abnormality. IMPRESSION: Interval left chest wall pacemaker placement without evidence of complication. Electronically Signed   By: Valetta Mole M.D.   On: 05/08/2021 08:56    Scheduled Meds:  aspirin EC  81 mg Oral Daily   atorvastatin  20 mg Oral QHS   [START ON 05/12/2021] clopidogrel  75 mg Oral Daily   feeding supplement  237 mL Oral TID BM   guaiFENesin  1,200 mg Oral BID   heparin injection (subcutaneous)  5,000 Units Subcutaneous Q8H   hydrALAZINE  50 mg Oral BID   insulin aspart  0-9 Units Subcutaneous TID WC   isosorbide mononitrate  30 mg Oral Daily   levETIRAcetam  500 mg Oral QHS   metoprolol tartrate  25 mg Oral BID   mometasone-formoterol  2 puff Inhalation BID   multivitamin with minerals  1 tablet Oral Daily   polyethylene glycol  17 g Oral Daily   senna  1 tablet Oral Daily   vitamin B-12  2,500 mcg Oral Daily   Continuous Infusions:   LOS: 5 days   Time spent: 25 minutes.  Patrecia Pour,  MD [facility] [email or web address] 05/09/2021, 1:29 PM

## 2021-05-09 NOTE — Plan of Care (Signed)

## 2021-05-10 LAB — GLUCOSE, CAPILLARY
Glucose-Capillary: 165 mg/dL — ABNORMAL HIGH (ref 70–99)
Glucose-Capillary: 171 mg/dL — ABNORMAL HIGH (ref 70–99)
Glucose-Capillary: 208 mg/dL — ABNORMAL HIGH (ref 70–99)
Glucose-Capillary: 320 mg/dL — ABNORMAL HIGH (ref 70–99)

## 2021-05-10 MED ORDER — OFF THE BEAT BOOK
Freq: Once | Status: AC
  Filled 2021-05-10: qty 1

## 2021-05-10 MED ORDER — INSULIN ASPART 100 UNIT/ML IJ SOLN
0.0000 [IU] | Freq: Three times a day (TID) | INTRAMUSCULAR | Status: DC
  Administered 2021-05-10: 7 [IU] via SUBCUTANEOUS
  Administered 2021-05-11: 2 [IU] via SUBCUTANEOUS
  Administered 2021-05-11: 7 [IU] via SUBCUTANEOUS

## 2021-05-10 NOTE — Progress Notes (Signed)
PROGRESS NOTE  Kayla [name]  FYB:[id number] DOB: 09/02/1940 DOA: 05/03/2021 PCP: Cipriano Mile, NP   Brief Narrative: Kayla [name] is an 80 y.o. female with a history of CAD s/p stenting D1, and LAD July 2016, T2DM (HbA1c 8.7%), HTN, HLD, CVA complicated by seizure disorder, breast CA, sickle cell trait, and covid-19 infection diagnosed 04/19/2021 who was recently admitted 11/9 - 11/11 for 2:1 2nd degree AVB in the setting of poor oral intake, fatigue, weakness. Atenolol was held with improvement in HR. Therefore, cardiology did not recommend pacemaker insertion at that time, and she was discharged with home health services at her functional baseline having shown improved oral intake. At home, she did not continue adequate oral intake, and grew more lethargic. Her son found her HR to be in 24's on 11/13, called EMS who recorded CHB on ECG and brought her to the ED where she was found to have hyperkalemia, metabolic acidosis, and acute renal failure with creatinine up to 3.72 from 1.56 at recent discharge due to cardiogenic shock. Transvenous pacer was placed on 11/14 and patient admitted to ICU, subsequently undergoing PPM insertion 11/17.  Assessment & Plan: Principal Problem:   Bradycardia Active Problems:   Essential hypertension   Type 2 diabetes mellitus with diabetic neuropathy (HCC)   Seizure as late effect of cerebrovascular accident (CVA) ([place])   Vascular dementia ([place])   COVID-19 virus infection   Complete heart block ([place])   ARF (acute renal failure) (HCC)   Pressure injury of skin  Complete heart block:  - s/p Biotronik DDD PPM 11/17 by Dr. Quentin Ore. - EP cardiology follow-up appreciated. Wound care and arm restrictions were reviewed with patient and son. Device interrogated and stable. EP cardiology signed off 11/18 and to follow-up as outpatient.   Acute renal failure with hyperkalemia and lactic acidosis on stage IIIb CKD:  - Resolved. Avoid nephrotoxins and hypotension.  Can recheck BMP in the next week.   CAD s/p stents 2016 with demand myocardial ischemia, HTN: LBBB on ECG, troponin up to 553 last night, down to 243 this morning. Recent echo with no wall motion abnormalities, preserved LVEF.  - Continue aspirin (plan to restart plavix which is the PTA antiplatelet on 11/22), statin, BB now that pacemaker is in.   T2DM: Poorly controlled with HbA1c 8.7%.  - Follow up with PCP for further management.   Hypokalemia: Resolved.   HLD:  - Continue statin   History of CVA, ensuing seizure disorder:  - Continue antiplatelet, statin, RF modification as above, and AED.    Moderate protein-calorie malnutrition:  - Supplement protein as able  Acute metabolic encephalopathy: Resolved. - Consider outpatient neuropsychiatric battery with concern for some level of cognitive impairment.  Covid-19 infection: Diagnosed 04/19/2021 without evidence of pneumonia, still negative CXR this admission. Well outside window for isolation or treatment. - Symptomatic Tx w/mucinex.   Deconditioning:  - Will require SNF level rehabilitation which is pending insurance authorization.   Left great toe pain: Exam is not consistent with any abnormality, currently quiescent, urate wnl.  - Tylenol prn.  DVT prophylaxis: Heparin [place] Code Status: Full Family Communication: None at bedside this AM Disposition Plan:  Status is: Inpatient due to unsafe DC, requires SNF level of care, planning DC 11/21. Medically stable for discharge.   Consultants:  EP/cardiology Nephrology PCCM Procedures:  S/p Biotronik DDD PPM 11/17 by Dr. Quentin Ore Subjective: No chest pain or dyspnea or lower extremity swelling.   Objective: BP 117/80 (BP Location: Right Arm)  Pulse 87   Temp 99.5 F (37.5 C) (Rectal)   Resp (!) 24   Ht 5\' 6"  (1.676 m)   Wt 76.4 kg   SpO2 100%   BMI 27.19 kg/m   Gen: Elderly in no distress Pulm: Nonlabored, clear CV: Regular paced rhythm  Time spent: 15  minutes.  Patrecia Pour, MD [facility] [email or web address] 05/10/2021, 11:50 AM

## 2021-05-10 NOTE — Plan of Care (Signed)

## 2021-05-11 LAB — GLUCOSE, CAPILLARY
Glucose-Capillary: 178 mg/dL — ABNORMAL HIGH (ref 70–99)
Glucose-Capillary: 305 mg/dL — ABNORMAL HIGH (ref 70–99)

## 2021-05-11 MED ORDER — METOPROLOL TARTRATE 25 MG PO TABS: 25.0000 mg | ORAL_TABLET | Freq: Two times a day (BID) | ORAL | Status: AC

## 2021-05-11 MED ORDER — CLOPIDOGREL BISULFATE 75 MG PO TABS: 75.0000 mg | ORAL_TABLET | Freq: Every day | ORAL | Status: AC

## 2021-05-11 NOTE — Progress Notes (Signed)
RE: Kayla [name]. [place]  Date of Birth: 09-14-1940  Date: 05/11/2021  To Whom It May Concern:  Please be advised that the above-named patient has a primary diagnosis of dementia which supersedes any psychiatric diagnosis.  MD signature  Date

## 2021-05-11 NOTE — Discharge Summary (Addendum)
Physician Discharge Summary  Kayla [name] TXH:[id number] DOB: 08-May-1941 DOA: 05/03/2021  PCP: Cipriano Mile, NP  Admit date: 05/03/2021 Discharge date: 05/11/2021  Admitted From: Home Disposition: SNF   Recommendations for Outpatient Follow-up:  Follow up with PCP in 1-2 weeks Follow up with EP and cardiology as below. Consider augmenting diabetes management.   Home Health: N/A Equipment/Devices: Implanted Biotronik DDD PPM 11/17 by Dr. Quentin Ore Discharge Condition: Stable CODE STATUS: Full Diet recommendation: Heart healthy  Brief/Interim Summary: Kayla [name] is an 80 y.o. female with a history of CAD s/p stenting D1, and LAD July 2016, T2DM (HbA1c 8.7%), HTN, HLD, CVA complicated by seizure disorder, breast CA, sickle cell trait, and covid-19 infection diagnosed 04/19/2021 who was recently admitted 11/9 - 11/11 for 2:1 2nd degree AVB in the setting of poor oral intake, fatigue, weakness. Atenolol was held with improvement in HR. Therefore, cardiology did not recommend pacemaker insertion at that time, and she was discharged with home health services at her functional baseline having shown improved oral intake. At home, she did not continue adequate oral intake, and grew more lethargic. Her son found her HR to be in 53's on 11/13, called EMS who recorded CHB on ECG and brought her to the ED where she was found to have hyperkalemia, metabolic acidosis, and acute renal failure with creatinine up to 3.72 from 1.56 at recent discharge due to cardiogenic shock. Transvenous pacer was placed on 11/14 and patient admitted to ICU, subsequently undergoing PPM insertion 11/17. With supportive care, renal function has improved and patient has hemodynamically stabilized. She will require physical rehabilitation at SNF prior to returning home.  Discharge Diagnoses:  Principal Problem:   Bradycardia Active Problems:   Essential hypertension   Type 2 diabetes mellitus with diabetic neuropathy  (HCC)   Seizure as late effect of cerebrovascular accident (CVA) ([place])   Vascular dementia ([place])   COVID-19 virus infection   Complete heart block ([employer])   ARF (acute renal failure) (HCC)   Pressure injury of skin  Complete heart block:  - s/p Biotronik DDD PPM 11/17 by Dr. Quentin Ore. - EP cardiology follow-up appreciated. Wound care and arm restrictions were reviewed with patient and son. Device interrogated and stable. EP cardiology signed off 11/18 and to follow-up as outpatient.    Acute renal failure with hyperkalemia and lactic acidosis on stage IIIb CKD:  - Resolved. Avoid nephrotoxins and hypotension. Can recheck BMP in the next week.   CAD s/p stents 2016 with demand myocardial ischemia, HTN: LBBB on ECG, troponin up to 553 last night, down to 243 this morning. Recent echo with no wall motion abnormalities, preserved LVEF.  - Restart plavix which is the PTA antiplatelet on 11/22, statin, BB now that pacemaker is in.   T2DM: Poorly controlled with HbA1c 8.7%.  - Follow up with PCP for further management. No changes to current regimen recommended. For protein supplementation, would recommend carbohydrate balanced/limited supplements.   Hypokalemia: Resolved.    HLD:  - Continue statin   History of CVA, ensuing seizure disorder:  - Continue antiplatelet, statin, RF modification as above, and AED.    Moderate protein-calorie malnutrition:  - Supplement protein as able   Acute metabolic encephalopathy: Resolved. Suspect a component of vascular dementia. - Consider outpatient neuropsychiatric battery with concern for some level of cognitive impairment.   Covid-19 infection: Diagnosed 04/19/2021 without evidence of pneumonia, still negative CXR this admission. Well outside window for isolation or treatment. - Symptomatic Tx w/mucinex.  Deconditioning:  - Will require SNF level rehabilitation which is pending insurance authorization.    Left great toe pain: Exam is not  consistent with any abnormality, currently quiescent, urate wnl. Possibly related to prolonged period in bed with cover over the toe. - Tylenol prn.  Discharge Instructions  Allergies as of 05/11/2021       Reactions   Amlodipine Shortness Of Breath, Rash   Fish Allergy Hives   Penicillins Other (See Comments)   REACTION: red rash   Shellfish Allergy Hives   Peanuts [peanut Oil] Rash   Tomato Rash        Medication List     STOP taking these medications    hydrochlorothiazide 12.5 MG tablet Commonly known as: HYDRODIURIL       TAKE these medications    acetaminophen 500 MG tablet Commonly known as: TYLENOL Take 1,000 mg by mouth every 6 (six) hours as needed for moderate pain or headache.   atorvastatin 20 MG tablet Commonly known as: LIPITOR Take 1 tablet (20 mg total) by mouth daily at 6 PM. What changed: when to take this   blood glucose meter kit and supplies Kit Dispense what is covered by patients insurance with strips and lancets. Use twice a day (before breakfast and at bedtime). E11.40   cetirizine 10 MG tablet Commonly known as: ZYRTEC Take 10 mg by mouth at bedtime.   clopidogrel 75 MG tablet Commonly known as: PLAVIX Take 1 tablet (75 mg total) by mouth daily. OFFICE VISIT IS DUE Start taking on: May 12, 2021   dextromethorphan-guaiFENesin 30-600 MG 12hr tablet Commonly known as: MUCINEX DM Take 1 tablet by mouth 2 (two) times daily as needed for cough.   glipiZIDE 2.5 MG 24 hr tablet Commonly known as: GLUCOTROL XL Take 1 tablet (2.5 mg total) by mouth 2 (two) times daily. VISIT IS OVERDUE!!!!!   glycopyrrolate 1 MG tablet Commonly known as: ROBINUL Take 1 mg by mouth at bedtime.   hydrALAZINE 50 MG tablet Commonly known as: APRESOLINE Take 1 tablet (50 mg total) by mouth 2 (two) times daily. Annual appt due in July must see provider for future refills   isosorbide mononitrate 30 MG 24 hr tablet Commonly known as: IMDUR Take  30 mg by mouth daily.   Jardiance 10 MG Tabs tablet Generic drug: empagliflozin TAKE 1 TABLET BY MOUTH EVERY DAY What changed: how much to take   levETIRAcetam 500 MG tablet Commonly known as: KEPPRA Take 500 mg by mouth at bedtime.   lisinopril 20 MG tablet Commonly known as: ZESTRIL Take 1 tablet (20 mg total) by mouth daily. Needs office visit   Metamucil Fiber Chew Chew 1 tablet by mouth daily.   metoprolol tartrate 25 MG tablet Commonly known as: LOPRESSOR Take 1 tablet (25 mg total) by mouth 2 (two) times daily.   nitroGLYCERIN 0.4 MG SL tablet Commonly known as: NITROSTAT Place 0.4 mg under the tongue every 5 (five) minutes as needed for chest pain.   ONE TOUCH ULTRA TEST test strip Generic drug: glucose blood USE TO TEST TWICE DAILY (BEFORE BREAKFAST, BEDTIME) E11.4 What changed: See the new instructions.   glucose blood test strip Use to check blood sugars twice daily What changed: additional instructions   OneTouch Ultra test strip Generic drug: glucose blood USE TO CHECK BLOOD SUGAR TWICE DAILY What changed: Another medication with the same name was changed. Make sure you understand how and when to take each.   OneTouch Delica Lancets 16X  Misc USE TO TEST BLOOD SUGAR TWICE DAILY What changed: See the new instructions.   polyethylene glycol 17 g packet Commonly known as: MIRALAX / GLYCOLAX Take 17 g by mouth daily as needed for moderate constipation.   Vitamin B-12 2500 MCG Subl Place 2,500 mcg under the tongue daily.   vitamin E 180 MG (400 UNITS) capsule Take 400 Units by mouth daily.        Follow-up Information     [facility] Follow up.   Specialty: Home Health Services Why: Physical Therapy, Occupational Therapy, Aide-office will call with visit times. If you need to call the office please call [phone]        Cipriano Mile, NP Follow up.   Contact information: [place]  [id number] [phone]         Sueanne Margarita, MD .   Specialty: Cardiology Contact information: [phone] [place] [address] [phone]         Vickie Epley, MD .   Specialties: Cardiology, Radiology Contact information: [address] [place] [address] [phone]                Allergies  Allergen Reactions   Amlodipine Shortness Of Breath and Rash   Fish Allergy Hives   Penicillins Other (See Comments)    REACTION: red rash   Shellfish Allergy Hives   Peanuts [Peanut Oil] Rash   Tomato Rash    Consultations: Cardiology/EP PCCM  Procedures/Studies: DG Chest 2 View  Result Date: 05/08/2021 CLINICAL DATA:  Pacemaker plate asthma EXAM: CHEST - 2 VIEW COMPARISON:  Chest radiograph 05/06/2021 FINDINGS: There is a new left chest wall cardiac device with 2 associated leads terminating over the right atrium and right ventricle. The cardiomediastinal silhouette is stable. There is no focal consolidation or pulmonary edema. There is no pleural effusion or pneumothorax. There is no acute osseous abnormality. IMPRESSION: Interval left chest wall pacemaker placement without evidence of complication. Electronically Signed   By: Valetta Mole M.D.   On: 05/08/2021 08:56   CARDIAC CATHETERIZATION  Result Date: 05/04/2021 Successful transvenous pacemaker placement via right internal jugular vein access.  Further recommendations per electrophysiology team.   US RENAL  Result Date: 05/04/2021 CLINICAL DATA:  Acute kidney injury. EXAM: RENAL / URINARY TRACT ULTRASOUND COMPLETE COMPARISON:  03/07/2009 FINDINGS: Right Kidney: Renal measurements: 9.0 x 4.3 x 4.9 centimeters = volume: 99.3 mL. RIGHT UPPER pole cyst is 3.1 centimeters. Second cyst is 1.8 centimeters. No hydronephrosis. Left Kidney: Renal measurements: 8.1 x 4.3 x 4.6 centimeters = volume: 82.7 mL. Echogenicity within normal limits. No mass or hydronephrosis visualized.  Bladder: Foley catheter decompresses the urinary bladder. Other: None. IMPRESSION: 1. Benign RIGHT renal cyst. 2. No hydronephrosis. Electronically Signed   By: Nolon Nations M.D.   On: 05/04/2021 11:00   DG CHEST PORT 1 VIEW  Result Date: 05/06/2021 CLINICAL DATA:  Cough, bradycardia EXAM: PORTABLE CHEST 1 VIEW COMPARISON:  Previous studies including the examination of 05/03/2021 FINDINGS: Transverse diameter of heart is increased. Thoracic aorta is tortuous and ectatic. There is poor inspiration. Increased interstitial markings are seen in the lower lung fields. There are no signs of alveolar pulmonary edema. There is no significant pleural effusion or pneumothorax. IMPRESSION: Increased interstitial markings in the lower lung fields may suggest crowding of bronchovascular structures due to poor inspiration or interstitial pneumonitis. Electronically Signed   By: Elmer Picker M.D.   On:  05/06/2021 08:15   DG Chest Portable 1 View  Result Date: 05/03/2021 CLINICAL DATA:  Bradycardia EXAM: PORTABLE CHEST 1 VIEW COMPARISON:  04/29/2021 FINDINGS: Lungs are symmetrically expanded and are clear. No pneumothorax or pleural effusion. Cardiac size within normal limits. Pulmonary vascularity is normal. Osseous structures are age-appropriate. No acute bone abnormality. IMPRESSION: No active disease. Electronically Signed   By: Fidela Salisbury M.D.   On: 05/03/2021 20:30   DG Chest Portable 1 View  Result Date: 04/29/2021 CLINICAL DATA:  80 year old female with history of weakness. EXAM: PORTABLE CHEST 1 VIEW COMPARISON:  Chest x-ray 06/19/2016. FINDINGS: Transcutaneous defibrillator pad projecting over the left hemithorax. Lung volumes are low. No consolidative airspace disease. No pleural effusions. No pneumothorax. No evidence of pulmonary edema. Heart size is normal. Mediastinal contours are distorted by patient positioning. Atherosclerotic calcifications in the thoracic aorta. IMPRESSION: 1. Low  lung volumes without radiographic evidence of acute cardiopulmonary disease. 2. Aortic atherosclerosis. Electronically Signed   By: Vinnie Langton M.D.   On: 04/29/2021 15:23   ECHOCARDIOGRAM COMPLETE  Result Date: 04/30/2021    ECHOCARDIOGRAM REPORT   Patient Name:   REIZEL CALZADA Date of Exam: 04/30/2021 Medical Rec #:  [id number]      Height:       66.5 in Accession #:    [id number]     Weight:       166.9 lb Date of Birth:  05/10/1941      BSA:          1.862 m Patient Age:    8 years       BP:           148/57 mmHg Patient Gender: F              HR:           47 bpm. Exam Location:  Inpatient Procedure: 2D Echo Indications:    second degree heart block  History:        Patient has prior history of Echocardiogram examinations, most                 recent 09/02/2014. CAD, Arrythmias:2nd degree av block; Risk                 Factors:Hypertension, Dyslipidemia and Diabetes.  Sonographer:    Johny Chess RDCS Referring Phys: [place] Comments: Suboptimal parasternal window. IMPRESSIONS  1. Left ventricular ejection fraction, by estimation, is 60 to 65%. The left ventricle has normal function. The left ventricle has no regional wall motion abnormalities. Left ventricular diastolic parameters are indeterminate.  2. Right ventricular systolic function is normal. The right ventricular size is normal.  3. The mitral valve is normal in structure. Mild mitral valve regurgitation. No evidence of mitral stenosis.  4. The aortic valve is normal in structure. Aortic valve regurgitation is not visualized. No aortic stenosis is present.  5. The inferior vena cava is normal in size with greater than 50% respiratory variability, suggesting right atrial pressure of 3 mmHg. FINDINGS  Left Ventricle: Left ventricular ejection fraction, by estimation, is 60 to 65%. The left ventricle has normal function. The left ventricle has no regional wall motion abnormalities. The left ventricular internal cavity  size was normal in size. There is  no left ventricular hypertrophy. Left ventricular diastolic parameters are indeterminate. Right Ventricle: The right ventricular size is normal. No increase in right ventricular wall thickness. Right ventricular systolic function is normal. Left Atrium: Left  atrial size was normal in size. Right Atrium: Right atrial size was normal in size. Pericardium: There is no evidence of pericardial effusion. Presence of pericardial fat pad. Mitral Valve: The mitral valve is normal in structure. Mild mitral valve regurgitation. No evidence of mitral valve stenosis. Tricuspid Valve: The tricuspid valve is normal in structure. Tricuspid valve regurgitation is not demonstrated. No evidence of tricuspid stenosis. Aortic Valve: The aortic valve is normal in structure. Aortic valve regurgitation is not visualized. No aortic stenosis is present. Aortic valve mean gradient measures 7.0 mmHg. Aortic valve peak gradient measures 14.0 mmHg. Pulmonic Valve: The pulmonic valve was normal in structure. Pulmonic valve regurgitation is not visualized. No evidence of pulmonic stenosis. Aorta: The aortic root is normal in size and structure. Venous: The inferior vena cava is normal in size with greater than 50% respiratory variability, suggesting right atrial pressure of 3 mmHg. IAS/Shunts: No atrial level shunt detected by color flow Doppler.  LEFT VENTRICLE PLAX 2D LVIDd:         3.50 cm Diastology LVIDs:         2.20 cm LV e' medial:    13.70 cm/s                        LV E/e' medial:  10.9                        LV e' lateral:   11.70 cm/s                        LV E/e' lateral: 12.8  RIGHT VENTRICLE             IVC RV S prime:     12.60 cm/s  IVC diam: 1.90 cm TAPSE (M-mode): 2.0 cm LEFT ATRIUM             Index        RIGHT ATRIUM           Index LA Vol (A2C):   30.1 ml 16.16 ml/m  RA Area:     11.60 cm LA Vol (A4C):   51.8 ml 27.82 ml/m  RA Volume:   25.80 ml  13.85 ml/m LA Biplane Vol: 41.6 ml  22.34 ml/m  AORTIC VALVE AV Vmax:           187.00 cm/s AV Vmean:          118.000 cm/s AV VTI:            0.438 m AV Peak Grad:      14.0 mmHg AV Mean Grad:      7.0 mmHg LVOT Vmax:         145.00 cm/s LVOT Vmean:        95.400 cm/s LVOT VTI:          0.375 m LVOT/AV VTI ratio: 0.86  AORTA Ao Asc diam: 3.10 cm MITRAL VALVE MV Area (PHT): 4.63 cm     SHUNTS MV Decel Time: 164 msec     Systemic VTI: 0.38 m MV E velocity: 150.00 cm/s MV A velocity: 139.00 cm/s MV E/A ratio:  1.08 Kardie Tobb DO Electronically signed by Berniece Salines DO Signature Date/Time: 04/30/2021/6:06:49 PM    Final     05/04/21 TEMPORARY PACEMAKER Sherren Mocha, MD  05/07/21 PACEMAKER IMPLANT Vickie Epley, MD   Subjective: No dyspnea, chest pain. Still with occasional cough that is overall improving.  No fever. Eating better.   Discharge Exam: Vitals:   05/11/21 0823 05/11/21 1117  BP:  109/72  Pulse: 97 81  Resp: 18 (!) 25  Temp:  97.7 F (36.5 C)  SpO2: 97% 100%   General: Pt is alert, awake, not in acute distress Cardiovascular: Regular paced rhythm, S1/S2 +, no rubs, no gallops Respiratory: CTA bilaterally, no wheezing, no rhonchi Abdominal: Soft, NT, ND, bowel sounds + Extremities: No pitting edema, no cyanosis  Labs: Basic Metabolic Panel: Recent Labs  Lab 05/04/21 2246 05/05/21 0430 05/06/21 0043 05/07/21 0311 05/08/21 0747 05/09/21 0158  NA 137 138 139 136 134* 133*  K 3.4* 3.3* 3.2* 4.5 4.8 4.7  CL 102 99 102 103 103 99  CO2 $Re'25 28 27 25 'UHT$ 21* 24  GLUCOSE 185* 186* 126* 175* 135* 169*  BUN 47* 39* 27* 26* 23 30*  CREATININE 2.56* 2.19* 1.79* 1.59* 1.49* 1.40*  CALCIUM 8.1* 8.0* 7.9* 8.3* 8.8* 9.0  PHOS 3.5 3.2  --  2.5  --   --    Liver Function Tests: Recent Labs  Lab 05/04/21 2246 05/05/21 0430 05/07/21 0311  ALBUMIN 2.7* 2.6* 2.5*   CBC: Recent Labs  Lab 05/06/21 0043 05/07/21 0311  WBC 6.4 7.3  HGB 12.7 12.0  HCT 38.9 38.3  MCV 89.2 91.4  PLT 199 172   CBG: Recent  Labs  Lab 05/10/21 1124 05/10/21 1653 05/10/21 2103 05/11/21 0755 05/11/21 1115  GLUCAP 171* 208* 320* 178* 305*   Urinalysis    Component Value Date/Time   COLORURINE YELLOW 05/04/2021 0923   APPEARANCEUR CLEAR 05/04/2021 0923   LABSPEC 1.020 05/04/2021 0923   PHURINE 5.5 05/04/2021 0923   GLUCOSEU >=500 (A) 05/04/2021 0923   GLUCOSEU NEGATIVE 02/25/2016 0949   HGBUR NEGATIVE 05/04/2021 [place] 05/04/2021 0923   BILIRUBINUR neg 02/03/2017 1132   KETONESUR NEGATIVE 05/04/2021 0923   PROTEINUR NEGATIVE 05/04/2021 0923   UROBILINOGEN 0.2 02/03/2017 1132   UROBILINOGEN 0.2 02/25/2016 0949   NITRITE NEGATIVE 05/04/2021 0923   LEUKOCYTESUR NEGATIVE 05/04/2021 0923    Microbiology:  - Positive SARS-CoV-2 Ag 04/19/2021  Recent Results (from the past 240 hour(s))  Resp Panel by RT-PCR (Flu A&B, Covid) Nasopharyngeal Swab     Status: Abnormal   Collection Time: 05/03/21 10:23 PM   Specimen: Nasopharyngeal Swab; Nasopharyngeal(NP) swabs in vial transport medium  Result Value Ref Range Status   SARS Coronavirus 2 by RT PCR POSITIVE (A) NEGATIVE Final   Influenza A by PCR NEGATIVE NEGATIVE Final   Influenza B by PCR NEGATIVE NEGATIVE Final  Surgical PCR screen     Status: None   Collection Time: 05/04/21  9:32 AM   Specimen: Nasal Mucosa; Nasal Swab  Result Value Ref Range Status   MRSA, PCR NEGATIVE NEGATIVE Final   Staphylococcus aureus NEGATIVE NEGATIVE Final  Surgical PCR screen     Status: None   Collection Time: 05/06/21  8:26 PM   Specimen: Nasal Mucosa; Nasal Swab  Result Value Ref Range Status   MRSA, PCR NEGATIVE NEGATIVE Final   Staphylococcus aureus NEGATIVE NEGATIVE Final    Time coordinating discharge: Approximately 40 minutes  Patrecia Pour, MD  [facility] 05/11/2021, 12:24 PM

## 2021-05-11 NOTE — TOC Transition Note (Signed)
Transition of Care [facility]) - CM/SW Discharge Note   Patient Details  Name: Kayla [name] MRN: [id number] Date of Birth: 1940-10-11  [facility]) CM/SW Contact:  Milas Gain, [place] Phone Number: 05/11/2021, 12:54 PM   Clinical Narrative:     Patient will DC to: [facility]  Anticipated DC date: 05/11/2021  Family notified: [place]reck  Transport by: Corey Harold  ?  Per MD patient ready for DC to [facility] . RN, patient, patient's family, and facility notified of DC. Discharge Summary sent to facility. RN given number for report tele# [phone] RM# [phone]. DC packet on chart. Ambulance transport requested for patient.  CSW signing off.   Final next level of care: Skilled Nursing Facility Barriers to Discharge: No Barriers Identified   Patient Goals and CMS Choice Patient states their goals for this hospitalization and ongoing recovery are:: SNF CMS Medicare.gov Compare Post Acute Care list provided to:: Patient Choice offered to / list presented to : Patient  Discharge Placement              Patient [facility] bed at: [facility] Patient to be transferred to facility by: [place] Name of family member notified: [place] Patient and family notified of of transfer: 05/11/21  Discharge Plan and Services                                     Social Determinants of Health ([place]) Interventions     Readmission Risk Interventions Readmission Risk Prevention Plan 05/01/2021  Transportation Screening Complete  PCP or Specialist Appt within 5-7 Days Complete  Home Care Screening Complete  Medication Review (RN CM) Complete  Some recent data might be hidden

## 2021-05-11 NOTE — TOC Progression Note (Addendum)
Transition of Care [facility]) - Progression Note    Patient Details  Name: Kayla [name] MRN: [id number] Date of Birth: 06-29-1940  Transition of Care [facility]) CM/SW [place], [place] Phone Number: 05/11/2021, 9:59 AM  Clinical Narrative:      Update- [facility] confirmed they can accept patient for dc today. MD informed. [place] confirmed no covid needed.  Update- Pasrr has now been approved. CSW informed MD. CSW called SNF and LVM awaiting callback to confirm patient can dc over today.  Update-Patients Pasrr went to level II. Pasrr is still pending. MD informed. [place] informed.  CSW spoke with Juliann Pulse with [facility] who confirmed they received insurance authorization for patient.Juliann Pulse confirmed they can accept patient for dc today when passr approved. Passr is currently pending. CSW submitting additional clinical requested to [place] confirmed no covid needed. CSW informed MD. CSW will continue to follow and assist with patients dc planning needs.  Expected Discharge Plan: [place] Barriers to Discharge: Continued Medical Work up  Expected Discharge Plan and Services Expected Discharge Plan: [place] arrangements for the past 2 months: Single Family Home Expected Discharge Date: 05/11/21                                     Social Determinants of Health (SDOH) Interventions    Readmission Risk Interventions Readmission Risk Prevention Plan 05/01/2021  Transportation Screening Complete  PCP or Specialist Appt within 5-7 Days Complete  Home Care Screening Complete  Medication Review (RN CM) Complete  Some recent data might be hidden

## 2021-05-11 NOTE — Progress Notes (Signed)
Inpatient Diabetes Program Recommendations  AACE/ADA: New Consensus Statement on Inpatient Glycemic Control (2015)  Target Ranges:  Prepandial:   less than 140 mg/dL      Peak postprandial:   less than 180 mg/dL (1-2 hours)      Critically ill patients:  140 - 180 mg/dL   Lab Results  Component Value Date   GLUCAP 305 (H) 05/11/2021   HGBA1C 8.7 (H) 04/30/2021    Review of Glycemic Control  Diabetes history: DM 2 Outpatient Diabetes medications: Jardiance 10 mg Daily, glipizide in the past Current orders for Inpatient glycemic control: Novolog 0-9 units tid & hs  Pt on Ensure Enlive tid between meals which contributes to postprandial hyperglycemia  Inpatient Diabetes Program Recommendations:    If not d/c'd today consider adding Novolog 4 units tid meal coverage  Thanks,  Tama Headings RN, MSN, BC-ADM Inpatient Diabetes Coordinator Team Pager [phone] (8a-5p)

## 2021-05-11 NOTE — Progress Notes (Signed)
Report has been given to [facility].

## 2021-05-20 ENCOUNTER — Other Ambulatory Visit: Payer: Self-pay

## 2021-05-20 ENCOUNTER — Ambulatory Visit (INDEPENDENT_AMBULATORY_CARE_PROVIDER_SITE_OTHER): Payer: Medicare (Managed Care)

## 2021-05-20 DIAGNOSIS — I442 Atrioventricular block, complete: Secondary | ICD-10-CM

## 2021-05-20 LAB — CUP PACEART INCLINIC DEVICE CHECK
Date Time Interrogation Session: 20221130162251
Implantable Lead Implant Date: 20221117
Implantable Lead Implant Date: 20221117
Implantable Lead Location: 753859
Implantable Lead Location: 753860
Implantable Lead Model: 377171
Implantable Lead Model: 377171
Implantable Lead Serial Number: 8000661393
Implantable Lead Serial Number: 800243000
Implantable Pulse Generator Implant Date: 20221117
Lead Channel Impedance Value: 507 Ohm
Lead Channel Impedance Value: 585 Ohm
Lead Channel Pacing Threshold Amplitude: 0.6 V
Lead Channel Pacing Threshold Amplitude: 1.4 V
Lead Channel Pacing Threshold Pulse Width: 0.4 ms
Lead Channel Pacing Threshold Pulse Width: 0.4 ms
Lead Channel Sensing Intrinsic Amplitude: 1.5 mV
Lead Channel Sensing Intrinsic Amplitude: 16 mV
Lead Channel Setting Pacing Amplitude: 3 V
Lead Channel Setting Pacing Amplitude: 3 V
Lead Channel Setting Pacing Pulse Width: 0.4 ms
Pulse Gen Model: 407145
Pulse Gen Serial Number: 70262712

## 2021-05-20 NOTE — Patient Instructions (Signed)

## 2021-05-20 NOTE — Progress Notes (Signed)
Wound check appointment. Steri-strips removed. Wound without redness or edema. Incision edges approximated, wound well healed. Normal device function. Thresholds, sensing, and impedances consistent with implant measurements. Device programmed at 3.0V/auto capture programmed on for extra safety margin until 3 month visit. Histogram distribution appropriate for patient and level of activity. No mode switches or high ventricular rates noted. Patient educated about wound care, arm mobility, lifting restrictions. ROV in 3 months with implanting physician.

## 2021-05-29 ENCOUNTER — Other Ambulatory Visit: Payer: Self-pay | Admitting: Internal Medicine

## 2021-05-29 DIAGNOSIS — E1140 Type 2 diabetes mellitus with diabetic neuropathy, unspecified: Secondary | ICD-10-CM

## 2021-05-31 NOTE — Progress Notes (Addendum)
Cardiology Office Note:    Date:  06/01/2021   ID:  Kayla [name], DOB 05/29/1941, MRN [id number]  PCP:  Cipriano Mile, NP   [facility] Providers Cardiologist:  Fransico Him, MD Electrophysiologist:  Vickie Epley, MD     Referring MD: Cipriano Mile, NP   Chief Complaint: hospital follow-up complete heart block, s/p PPM  History of Present Illness:    Kayla [name] is a 80 y.o. female with a hx of HTN, CAD with prior stenting x 2, dementia, hyperlipidemia, sickle cell trait, complete heart block s/p PPM, DM, CVA, and breast cancer.   She underwent ILR implant 3/16 at time of cryptogenic stroke. In 2016, she had chest pain and had stenting to her LAD. Remote ILR interrogation demonstrated SVT. Upon the end of monitor life, there was no documentation of atrial fibrillation.She was previously a patient of Dr. Terrence Dupont and established care with our group in 11/20 for evaluation of chest pain and preop cardiac clearance for breast surgery. Nuclear stress test revealed no ischemia or infarct and 75% gated EF.   She was admitted to [facility]  on 04/29/21 with fatigue and bradycardia following Covid-19 infection. EKG revealed 2:1 AV block with new LBBB and PR of 200 ms. She was taking atenolol twice daily and it was placed on hold shortly after her admission. Following washout of beta blocker, her cardiac rhythm normalized  and she was cleared for discharge on 05/01/21. Two days later she returned to [facility] ED with confusion and a heart rate in the 30's at home. Her EKG revealed CHB  with escape rhythm around 30 bpm. She was normotensive but creatinine increased from 1.56 to 3.7 and K+ was 5.5. Following treatment of hypokalemia, her rhythm initially improved but she continued to intermittently lose conduction with HR dropping into the 20s. Temporary pacing was initiated followed by permanent pacemaker on 05/07/21. During her hospitalization, she had an elevated hs troponin of 553 thought to be demand  ischemia in the setting of AKI and bradycardia.   She is here today with her son. She is in a wheelchair and was discharged from SNF today. She lives at home with her husband and her son lives close by and checks on her frequently. Son reports she will have 90 days of home health. She denies chest pain, shortness of breath, lower extremity edema, fatigue, palpitations, melena, hematuria, hemoptysis, diaphoresis, weakness, presyncope, syncope, orthopnea, and PND.   Past Medical History:  Diagnosis Date   Allergy    Shell fish   Anxiety    Arthritis    "knees, back, probably left hand" (01/14/2015)   Asthmatic bronchitis    Blind left eye    CAD (coronary artery disease)    cath in 2016 showing 40% ostial RCA, 40% ostial D1, 90% proximal D1, 85% and 70% sequential distal LAD stenosis and underwent PCI with stents in the D1 and LAD.   Constipation    Depression    Diverticulosis of colon    DJD (degenerative joint disease)    Fibromyalgia    GERD (gastroesophageal reflux disease)    Heart murmur    History of gout    Hypercholesteremia    Hypertension    Personal history of noncompliance with medical treatment, presenting hazards to health    Prolapse of vaginal vault after hysterectomy    Proteinuria    Sickle-cell trait ([place])    Somatic dysfunction    Stroke ([place]) ~ 08/2014   "blind in left  eye; weak on left side since" (01/14/2015)   Type II diabetes mellitus ([place])    Venous insufficiency     Past Surgical History:  Procedure Laterality Date   ABDOMINAL HYSTERECTOMY     CARDIAC CATHETERIZATION N/A 01/14/2015   Procedure: Left Heart Cath and Coronary Angiography;  Surgeon: Charolette Forward, MD;  Location: [place] [facility];  Service: Cardiovascular;  Laterality: N/A;   CARDIAC CATHETERIZATION  ~ 2011   CATARACT EXTRACTION W/ INTRAOCULAR LENS  IMPLANT, BILATERAL Bilateral 2012   CORONARY ANGIOPLASTY     DILATION AND CURETTAGE OF UTERUS  "probably"   LOOP RECORDER IMPLANT N/A  09/03/2014   Procedure: LOOP RECORDER IMPLANT;  Surgeon: Thompson Grayer, MD;  Location: [facility] CATH LAB;  Service: Cardiovascular;  Laterality: N/A;   PACEMAKER IMPLANT N/A 05/07/2021   Procedure: PACEMAKER IMPLANT;  Surgeon: Vickie Epley, MD;  Location: [place] [facility];  Service: Cardiovascular;  Laterality: N/A;   ROBOTIC ASSISTED LAPAROSCOPIC SACROCOLPOPEXY  09/2009   Dr. Matilde Sprang   TEE WITHOUT CARDIOVERSION N/A 09/02/2014   Procedure: TRANSESOPHAGEAL ECHOCARDIOGRAM (TEE);  Surgeon: Lelon Perla, MD;  Location: [facility] ENDOSCOPY;  Service: Cardiovascular;  Laterality: N/A;   TEMPORARY PACEMAKER N/A 05/04/2021   Procedure: TEMPORARY PACEMAKER;  Surgeon: Sherren Mocha, MD;  Location: [place] [facility];  Service: Cardiovascular;  Laterality: N/A;    Current Medications: Current Meds  Medication Sig   acetaminophen (TYLENOL) 500 MG tablet Take 1,000 mg by mouth every 6 (six) hours as needed for moderate pain or headache.   atorvastatin (LIPITOR) 20 MG tablet Take 1 tablet (20 mg total) by mouth daily at 6 PM. (Patient taking differently: Take 20 mg by mouth at bedtime.)   blood glucose meter kit and supplies KIT Dispense what is covered by patients insurance with strips and lancets. Use twice a day (before breakfast and at bedtime). E11.40   cetirizine (ZYRTEC) 10 MG tablet Take 10 mg by mouth at bedtime.   clopidogrel (PLAVIX) 75 MG tablet Take 1 tablet (75 mg total) by mouth daily. OFFICE VISIT IS DUE   Cyanocobalamin (VITAMIN B-12) 2500 MCG SUBL Place 2,500 mcg under the tongue daily.   dextromethorphan-guaiFENesin (MUCINEX DM) 30-600 MG 12hr tablet Take 1 tablet by mouth 2 (two) times daily as needed for cough.   glipiZIDE (GLUCOTROL XL) 2.5 MG 24 hr tablet Take 1 tablet (2.5 mg total) by mouth 2 (two) times daily. VISIT IS OVERDUE!!!!!   glucose blood test strip Use to check blood sugars twice daily (Patient taking differently: Check blood sugar every morning)   glycopyrrolate  (ROBINUL) 1 MG tablet Take 1 mg by mouth at bedtime.   hydrALAZINE (APRESOLINE) 50 MG tablet Take 1 tablet (50 mg total) by mouth 2 (two) times daily. Annual appt due in July must see provider for future refills   isosorbide mononitrate (IMDUR) 30 MG 24 hr tablet Take 30 mg by mouth daily.   JARDIANCE 10 MG TABS tablet TAKE 1 TABLET BY MOUTH EVERY DAY (Patient taking differently: Take 10 mg by mouth daily.)   levETIRAcetam (KEPPRA) 500 MG tablet Take 500 mg by mouth at bedtime.   lisinopril (ZESTRIL) 20 MG tablet Take 1 tablet (20 mg total) by mouth daily. Needs office visit   Metamucil Fiber CHEW Chew 1 tablet by mouth daily.   metoprolol tartrate (LOPRESSOR) 25 MG tablet Take 1 tablet (25 mg total) by mouth 2 (two) times daily.   nitroGLYCERIN (NITROSTAT) 0.4 MG SL tablet Place 0.4 mg under the  tongue every 5 (five) minutes as needed for chest pain.   ONE TOUCH ULTRA TEST test strip USE TO TEST TWICE DAILY (BEFORE BREAKFAST, BEDTIME) E11.4 (Patient taking differently: Check blood sugar every morning)   OneTouch Delica Lancets 27C MISC USE TO TEST BLOOD SUGAR TWICE DAILY (Patient taking differently: Check blood sugar every morning)   ONETOUCH ULTRA test strip USE TO CHECK BLOOD SUGAR TWICE DAILY   polyethylene glycol (MIRALAX / GLYCOLAX) packet Take 17 g by mouth daily as needed for moderate constipation.   vitamin E 180 MG (400 UNITS) capsule Take 400 Units by mouth daily.     Allergies:   Amlodipine, Fish allergy, Penicillins, Shellfish allergy, Peanuts [peanut oil], and Tomato   Social History   Socioeconomic History   Marital status: Married    Spouse name: Not on file   Number of children: 4   Years of education: 13   Highest education level: Not on file  Occupational History   Occupation: [employer]    Comment: retired  Tobacco Use   Smoking status: Never   Smokeless tobacco: Never  Substance and Sexual Activity   Alcohol use: Yes    Alcohol/week: 0.0 standard drinks     Comment: 01/14/2015 "might have a glass of wine a few times/yr"   Drug use: No   Sexual activity: Never  Other Topics Concern   Not on file  Social History Narrative   Married   Right handed   Caffeine use - 1 cup coffee daily   Social Determinants of Health   Financial Resource Strain: Not on file  Food Insecurity: Not on file  Transportation Needs: Not on file  Physical Activity: Not on file  Stress: Not on file  Social Connections: Not on file     Family History: The patient's family history includes Bell's palsy in her son; Hypertension in her father and sister; Prostate cancer in her father; Seizures in her sister. There is no history of Heart attack or Stroke.  ROS:   Please see the history of present illness.  All other systems reviewed and are negative.  Labs/Other Studies Reviewed:    The following studies were reviewed today:  LHC 7/16  Ost RCA lesion, 40% stenosed. Prox RCA to Mid RCA lesion, 20% stenosed. Dist RCA lesion, 15% stenosed. Ost 1st Diag lesion, 40% stenosed. 1st Diag lesion, 90% stenosed. There is a 0% residual stenosis post intervention. A drug-eluting stent was placed. Dist LAD-2 lesion, 85% stenosed. Dist LAD-1 lesion, 70% stenosed. There is a 0% residual stenosis post intervention. A drug-eluting stent was placed.  Lexiscan 12/20  The left ventricular ejection fraction is hyperdynamic (>65%). Nuclear stress EF: 75%. There was no ST segment deviation noted during stress. The study is normal. This is a low risk study.   Normal stress nuclear study with no ischemia or infarction.  Gated ejection fraction 75% with normal wall motion.  Echo 11/22  Left Ventricle: Left ventricular ejection fraction, by estimation, is 60  to 65%. The left ventricle has normal function. The left ventricle has no  regional wall motion abnormalities. The left ventricular internal cavity  size was normal in size. There is  no left ventricular hypertrophy. Left  ventricular diastolic parameters are indeterminate.  Right Ventricle: The right ventricular size is normal. No increase in  right ventricular wall thickness. Right ventricular systolic function is  normal.  Left Atrium: Left atrial size was normal in size.  Right Atrium: Right atrial size was normal in size.  Pericardium: There is no evidence of pericardial effusion. Presence of  pericardial fat pad.  Mitral Valve: The mitral valve is normal in structure. Mild mitral valve  regurgitation. No evidence of mitral valve stenosis.  Tricuspid Valve: The tricuspid valve is normal in structure. Tricuspid  valve regurgitation is not demonstrated. No evidence of tricuspid  stenosis.  Aortic Valve: The aortic valve is normal in structure. Aortic valve  regurgitation is not visualized. No aortic stenosis is present. Aortic  valve mean gradient measures 7.0 mmHg. Aortic valve peak gradient measures 14.0 mmHg.  Pulmonic Valve: The pulmonic valve was normal in structure. Pulmonic valve regurgitation is not visualized. No evidence of pulmonic stenosis.  Aorta: The aortic root is normal in size and structure.  Venous: The inferior vena cava is normal in size with greater than 50%  respiratory variability, suggesting right atrial pressure of 3 mmHg.  IAS/Shunts: No atrial level shunt detected by color flow Doppler.   CXR 05/09/19  There is a new left chest wall cardiac device with 2 associated leads terminating over the right atrium and right ventricle. The cardiomediastinal silhouette is stable. There is no focal consolidation or pulmonary edema. There is no pleural effusion or pneumothorax. There is no acute osseous abnormality.    Recent Labs: 05/04/2021: ALT 18; Magnesium 2.5; TSH 1.260 05/07/2021: Hemoglobin 12.0; Platelets 172 05/09/2021: BUN 30; Creatinine, Ser 1.40; Potassium 4.7; Sodium 133  Recent Lipid Panel    Component Value Date/Time   CHOL 106 12/29/2018 1207   TRIG 136.0  12/29/2018 1207   HDL 35.10 (L) 12/29/2018 1207   CHOLHDL 3 12/29/2018 1207   VLDL 27.2 12/29/2018 1207   LDLCALC 44 12/29/2018 1207     Physical Exam:    VS:  BP 126/72   Pulse 78   Ht _0  (1.676 m)   Wt 162 lb (73.5 kg)   SpO2 98%   BMI 26.15 kg/m     Wt Readings from Last 3 Encounters:  06/01/21 162 lb (73.5 kg)  05/11/21 170 lb 10.2 oz (77.4 kg)  04/30/21 166 lb 14.2 oz (75.7 kg)     GEN:  Well nourished, well developed in no acute distress HEENT: Normal NECK: No JVD; No carotid bruits LYMPHATICS: No lymphadenopathy CARDIAC: RRR, no murmurs, rubs, gallops RESPIRATORY:  Clear to auscultation without rales, wheezing or rhonchi  ABDOMEN: Soft, non-tender, non-distended MUSCULOSKELETAL:  No edema; No deformity  SKIN: Warm and dry. Left chest wound at site of PPM insertion is well approximated without s/s of infection.  NEUROLOGIC:  Alert and oriented x 3 PSYCHIATRIC:  Normal affect   EKG:  EKG is not ordered today.    Diagnoses:    1. Complete heart block ([place])   2. Hyperlipidemia associated with type 2 diabetes mellitus ([place])   3. Essential hypertension   4. CKD stage G3b/A1, GFR 30-44 and albumin creatinine ratio <30 mg/g (HCC)   5. Acute kidney injury superimposed on CKD ([place])    Assessment and Plan:     Complete heart block: She reports she is feeling well since hospital discharge. Surgical site is well healed. She is going home today from her rehab facility.  She has had postop wound check with our device team and has physician pacer check scheduled for February 2023.  She was restarted on metoprolol during hospitalization in the setting of pacemaker placement. HR is regular at 78 bpm.   Essential hypertension: BP is stable today.  During hospitalization, she developed AKI as well as  bradycardia and underwent pacemaker implant. She was discharged on lisinopril, metoprolol, Imdur. Her son is agreeable to monitor home BP and she will be followed by home health.  Continue current medications.   CAD without angina/Elevated hs Troponin: Troponin was elevated to 553 but trended downward. Echo 04/30/21 showed no wall motion abnormalities. It was determined that no additional ishemia evaluated was warranted during hospitalization. Today she is pain free. Low suspicion for ischemia. We believe the troponin was elevated due to demand ischemia in the setting of multiple metabolic stressors. She participated in rehabilitation therapy for the past 3 weeks without c/o chest pain or SOB per patient's son. Continue Plavix, Imdur, metoprolol.  AKI/CKD: AKI resolved during hospitalization. GFR 38 on 05/09/21, CKD stage 3b. Creatinine has returned to baseline. Lisinopril was initially held then resumed at d/c. Plan is for outpatient follow-up by nephrology. Management per nephrology. Would favor rechecking bmet at next office visit if not done by another provider.   Hyperlipidemia LDL goal < 70: Last LDL 44 7/20. Will recheck today. Continue atorvastatin.   Disposition: f/u with Dr. Quentin Ore in 2 months and Dr. Radford Pax in 4 months   Addendum:  I called patient's SNF, [facility]. Patient's bmet on 05/22/21 revealed: Glucose 102, Na+ 139, K+ 4.8, Creatinine 1.66, GFR 31. I have scheduled a repeat bmet in 7-10 days.   Medication Adjustments/Labs and Tests Ordered: Current medicines are reviewed at length with the patient today.  Concerns regarding medicines are outlined above.  Orders Placed This Encounter  Procedures   Lipid panel    No orders of the defined types were placed in this encounter.   Patient Instructions  Medication Instructions:  Your physician recommends that you continue on your current medications as directed. Please refer to the Current Medication list given to you today.  *If you need a refill on your cardiac medications before your next appointment, please call your pharmacy*   Lab Work: TODAY:  LIPID  If you have labs (blood  work) drawn today and your tests are completely normal, you will receive your results only by: [place] (if you have MyChart) OR A paper copy in the mail If you have any lab test that is abnormal or we need to change your treatment, we will call you to review the results.   Testing/Procedures: None ordered   Follow-Up: At [facility], you and your health needs are our priority.  As part of our continuing mission to provide you with exceptional heart care, we have created designated Provider Care Teams.  These Care Teams include your primary Cardiologist (physician) and Advanced Practice Providers (APPs -  Physician Assistants and Nurse Practitioners) who all work together to provide you with the care you need, when you need it.  We recommend signing up for the patient portal called "MyChart".  Sign up information is provided on this After Visit Summary.  MyChart is used to connect with patients for Virtual Visits (Telemedicine).  Patients are able to view lab/test results, encounter notes, upcoming appointments, etc.  Non-urgent messages can be sent to your provider as well.   To learn more about what you can do with MyChart, go to [email or web address].    Your next appointment:   09/30/21 ARRIVE AT 9:45  The format for your next appointment:   In Person  Provider:   Fransico Him, MD  or Christen Bame, NP         You already have a follow-up appointment with Dr. Quentin Ore in  February.   Other Instructions    Signed, Emmaline Life, NP  06/01/2021 4:58 PM    [facility]

## 2021-06-01 ENCOUNTER — Other Ambulatory Visit: Payer: Self-pay

## 2021-06-01 ENCOUNTER — Encounter: Payer: Self-pay | Admitting: Nurse Practitioner

## 2021-06-01 ENCOUNTER — Ambulatory Visit (INDEPENDENT_AMBULATORY_CARE_PROVIDER_SITE_OTHER): Payer: Medicare (Managed Care) | Admitting: Nurse Practitioner

## 2021-06-01 VITALS — BP 126/72 | HR 78 | Ht 66.0 in | Wt 162.0 lb

## 2021-06-01 DIAGNOSIS — E785 Hyperlipidemia, unspecified: Secondary | ICD-10-CM

## 2021-06-01 DIAGNOSIS — I442 Atrioventricular block, complete: Secondary | ICD-10-CM | POA: Diagnosis not present

## 2021-06-01 DIAGNOSIS — N179 Acute kidney failure, unspecified: Secondary | ICD-10-CM

## 2021-06-01 DIAGNOSIS — E1169 Type 2 diabetes mellitus with other specified complication: Secondary | ICD-10-CM | POA: Diagnosis not present

## 2021-06-01 DIAGNOSIS — I10 Essential (primary) hypertension: Secondary | ICD-10-CM | POA: Diagnosis not present

## 2021-06-01 DIAGNOSIS — N1832 Chronic kidney disease, stage 3b: Secondary | ICD-10-CM | POA: Diagnosis not present

## 2021-06-01 DIAGNOSIS — N189 Chronic kidney disease, unspecified: Secondary | ICD-10-CM

## 2021-06-01 LAB — LIPID PANEL
Chol/HDL Ratio: 3.7 ratio (ref 0.0–4.4)
Cholesterol, Total: 114 mg/dL (ref 100–199)
HDL: 31 mg/dL — ABNORMAL LOW (ref >39)
LDL Chol Calc (NIH): 54 mg/dL (ref 0–99)
Triglycerides: 173 mg/dL — ABNORMAL HIGH (ref 0–149)
VLDL Cholesterol Cal: 29 mg/dL (ref 5–40)

## 2021-06-01 NOTE — Patient Instructions (Addendum)
Medication Instructions:  Your physician recommends that you continue on your current medications as directed. Please refer to the Current Medication list given to you today.  *If you need a refill on your cardiac medications before your next appointment, please call your pharmacy*   Lab Work: TODAY:  LIPID  If you have labs (blood work) drawn today and your tests are completely normal, you will receive your results only by: [place] (if you have MyChart) OR A paper copy in the mail If you have any lab test that is abnormal or we need to change your treatment, we will call you to review the results.   Testing/Procedures: None ordered   Follow-Up: At [facility], you and your health needs are our priority.  As part of our continuing mission to provide you with exceptional heart care, we have created designated Provider Care Teams.  These Care Teams include your primary Cardiologist (physician) and Advanced Practice Providers (APPs -  Physician Assistants and Nurse Practitioners) who all work together to provide you with the care you need, when you need it.  We recommend signing up for the patient portal called "MyChart".  Sign up information is provided on this After Visit Summary.  MyChart is used to connect with patients for Virtual Visits (Telemedicine).  Patients are able to view lab/test results, encounter notes, upcoming appointments, etc.  Non-urgent messages can be sent to your provider as well.   To learn more about what you can do with MyChart, go to [email or web address].    Your next appointment:   09/30/21 ARRIVE AT 9:45  The format for your next appointment:   In Person  Provider:   Fransico Him, MD  or Christen Bame, NP         You already have a follow-up appointment with Dr. Quentin Ore in February.   Other Instructions

## 2021-06-02 ENCOUNTER — Other Ambulatory Visit: Payer: Self-pay | Admitting: *Deleted

## 2021-06-02 DIAGNOSIS — N189 Chronic kidney disease, unspecified: Secondary | ICD-10-CM

## 2021-06-02 DIAGNOSIS — I442 Atrioventricular block, complete: Secondary | ICD-10-CM

## 2021-06-02 DIAGNOSIS — I10 Essential (primary) hypertension: Secondary | ICD-10-CM

## 2021-06-02 DIAGNOSIS — N179 Acute kidney failure, unspecified: Secondary | ICD-10-CM

## 2021-06-02 DIAGNOSIS — N1832 Chronic kidney disease, stage 3b: Secondary | ICD-10-CM

## 2021-06-02 NOTE — Progress Notes (Signed)
Bmet on 12/21

## 2021-06-02 NOTE — Addendum Note (Signed)
Addended by: Thompson Grayer on: 06/02/2021 09:50 AM   Modules accepted: Orders

## 2021-06-10 ENCOUNTER — Other Ambulatory Visit: Payer: Medicare (Managed Care)

## 2021-06-10 ENCOUNTER — Other Ambulatory Visit: Payer: Self-pay

## 2021-06-10 DIAGNOSIS — N179 Acute kidney failure, unspecified: Secondary | ICD-10-CM

## 2021-06-10 DIAGNOSIS — I10 Essential (primary) hypertension: Secondary | ICD-10-CM

## 2021-06-10 DIAGNOSIS — N1832 Chronic kidney disease, stage 3b: Secondary | ICD-10-CM

## 2021-06-10 DIAGNOSIS — I442 Atrioventricular block, complete: Secondary | ICD-10-CM

## 2021-06-10 DIAGNOSIS — N189 Chronic kidney disease, unspecified: Secondary | ICD-10-CM

## 2021-06-10 LAB — BASIC METABOLIC PANEL
BUN/Creatinine Ratio: 15 (ref 12–28)
BUN: 23 mg/dL (ref 8–27)
CO2: 20 mmol/L (ref 20–29)
Calcium: 9 mg/dL (ref 8.7–10.3)
Chloride: 109 mmol/L — ABNORMAL HIGH (ref 96–106)
Creatinine, Ser: 1.57 mg/dL — ABNORMAL HIGH (ref 0.57–1.00)
Glucose: 138 mg/dL — ABNORMAL HIGH (ref 70–99)
Potassium: 4.3 mmol/L (ref 3.5–5.2)
Sodium: 143 mmol/L (ref 134–144)
eGFR: 33 mL/min/{1.73_m2} — ABNORMAL LOW (ref >59)

## 2021-06-11 ENCOUNTER — Telehealth: Payer: Self-pay

## 2021-06-11 NOTE — Telephone Encounter (Signed)
-----   Message from Emmaline Life, NP sent at 06/11/2021  9:24 AM EST ----- Her creatinine has improved since discharge from SNF (last creatinine there on 12/22 was 1.66). She may continue her current medications and plan of care.

## 2021-06-11 NOTE — Telephone Encounter (Signed)
Spoke with pt who gives permission to speak with her son, Alvester Chou.  Advised per Sharyn Lull Swinyer,NP:Her creatinine has improved since discharge from SNF (last creatinine there on 12/22 was 1.66). She may continue her current medications and plan of care.  Pt's son verbalizes understanding and thanked Therapist, sports for the call.

## 2021-06-11 NOTE — Telephone Encounter (Signed)
Attempted phone call to pt.  Left voicemail message to contact triage at [phone]. 

## 2021-08-07 ENCOUNTER — Ambulatory Visit (INDEPENDENT_AMBULATORY_CARE_PROVIDER_SITE_OTHER): Payer: Medicare (Managed Care)

## 2021-08-07 DIAGNOSIS — I442 Atrioventricular block, complete: Secondary | ICD-10-CM | POA: Diagnosis not present

## 2021-08-07 LAB — CUP PACEART REMOTE DEVICE CHECK
Date Time Interrogation Session: 20230217073006
Implantable Lead Implant Date: 20221117
Implantable Lead Implant Date: 20221117
Implantable Lead Location: 753859
Implantable Lead Location: 753860
Implantable Lead Model: 377171
Implantable Lead Model: 377171
Implantable Lead Serial Number: 8000661393
Implantable Lead Serial Number: 800243000
Implantable Pulse Generator Implant Date: 20221117
Pulse Gen Model: 407145
Pulse Gen Serial Number: 70262712

## 2021-08-13 NOTE — Progress Notes (Signed)
Remote pacemaker transmission.   

## 2021-08-17 ENCOUNTER — Encounter: Payer: Medicare (Managed Care) | Admitting: Cardiology

## 2021-09-29 NOTE — Progress Notes (Signed)
? ?Cardiology Office Note   ? ?Date:  09/30/2021  ? ?ID:  Kayla [name], DOB 1940-07-16, MRN [id number] ? ?PCP:  Cipriano Mile, NP  ?Cardiologist:  Fransico Him, MD  ? ?Chief Complaint  ?Patient presents with  ? Coronary Artery Disease  ? Hypertension  ? Hyperlipidemia  ? ? ?History of Present Illness:  ?Kayla [name] is a 81 y.o. female with a hx of ASCAD with cath in 2016 showing 40% ostial RCA, 40% ostial D1, 90% proximal D1, 85% and 70% sequential distal LAD stenosis and underwent PCI with stents in the D1 and LAD.  She also has a hx of GERD, HLD, HTN, DM2 and sickle cell trait as well as medical noncompliance.  She has a history of complete heart block status post permanent pacemaker placement 04/2021, breast TIA and CVA as well.  Nuclear stress test and 2020 showed no ischemia and EF 75%. ? ?She is here today for followup and is doing well.  She denies any chest pain or pressure, SOB, DOE, PND, orthopnea, dizziness, palpitations or syncope. She has intermittent LE edema if she sits for long periods of time. She is compliant with her meds and is tolerating meds with no SE.    ? ?Past Medical History:  ?Diagnosis Date  ? Allergy   ? Shell fish  ? Anxiety   ? Arthritis   ? "knees, back, probably left hand" (01/14/2015)  ? Asthmatic bronchitis   ? Blind left eye   ? CAD (coronary artery disease)   ? cath in 2016 showing 40% ostial RCA, 40% ostial D1, 90% proximal D1, 85% and 70% sequential distal LAD stenosis and underwent PCI with stents in the D1 and LAD.  ? Constipation   ? Depression   ? Diverticulosis of colon   ? DJD (degenerative joint disease)   ? Fibromyalgia   ? GERD (gastroesophageal reflux disease)   ? Heart murmur   ? History of gout   ? Hypercholesteremia   ? Hypertension   ? Personal history of noncompliance with medical treatment, presenting hazards to health   ? Prolapse of vaginal vault after hysterectomy   ? Proteinuria   ? Sickle-cell trait ([place])   ? Somatic dysfunction   ? Stroke [facility]) ~  08/2014  ? "blind in left eye; weak on left side since" (01/14/2015)  ? Type II diabetes mellitus ([place])   ? Venous insufficiency   ? ? ?Past Surgical History:  ?Procedure Laterality Date  ? ABDOMINAL HYSTERECTOMY    ? CARDIAC CATHETERIZATION N/A 01/14/2015  ? Procedure: Left Heart Cath and Coronary Angiography;  Surgeon: Charolette Forward, MD;  Location: [place] [facility];  Service: Cardiovascular;  Laterality: N/A;  ? CARDIAC CATHETERIZATION  ~ 2011  ? CATARACT EXTRACTION W/ INTRAOCULAR LENS  IMPLANT, BILATERAL Bilateral 2012  ? CORONARY ANGIOPLASTY    ? DILATION AND CURETTAGE OF UTERUS  "probably"  ? LOOP RECORDER IMPLANT N/A 09/03/2014  ? Procedure: LOOP RECORDER IMPLANT;  Surgeon: Thompson Grayer, MD;  Location: [facility] CATH LAB;  Service: Cardiovascular;  Laterality: N/A;  ? PACEMAKER IMPLANT N/A 05/07/2021  ? Procedure: PACEMAKER IMPLANT;  Surgeon: Vickie Epley, MD;  Location: [place] [facility];  Service: Cardiovascular;  Laterality: N/A;  ? ROBOTIC ASSISTED LAPAROSCOPIC SACROCOLPOPEXY  09/2009  ? Dr. Matilde Sprang  ? TEE WITHOUT CARDIOVERSION N/A 09/02/2014  ? Procedure: TRANSESOPHAGEAL ECHOCARDIOGRAM (TEE);  Surgeon: Lelon Perla, MD;  Location: [facility] ENDOSCOPY;  Service: Cardiovascular;  Laterality: N/A;  ? TEMPORARY PACEMAKER  N/A 05/04/2021  ? Procedure: TEMPORARY PACEMAKER;  Surgeon: Sherren Mocha, MD;  Location: [place] [facility];  Service: Cardiovascular;  Laterality: N/A;  ? ? ?Current Medications: ?Current Meds  ?Medication Sig  ? acetaminophen (TYLENOL) 500 MG tablet Take 1,000 mg by mouth every 6 (six) hours as needed for moderate pain or headache.  ? atorvastatin (LIPITOR) 20 MG tablet Take 1 tablet (20 mg total) by mouth daily at 6 PM.  ? blood glucose meter kit and supplies KIT Dispense what is covered by patients insurance with strips and lancets. ?Use twice a day (before breakfast and at bedtime). ?E11.40  ? cetirizine (ZYRTEC) 10 MG tablet Take 10 mg by mouth at bedtime.  ? clopidogrel (PLAVIX) 75 MG  tablet Take 1 tablet (75 mg total) by mouth daily. OFFICE VISIT IS DUE  ? Cyanocobalamin (VITAMIN B-12) 2500 MCG SUBL Place 2,500 mcg under the tongue daily.  ? dextromethorphan-guaiFENesin (MUCINEX DM) 30-600 MG 12hr tablet Take 1 tablet by mouth 2 (two) times daily as needed for cough.  ? glipiZIDE (GLUCOTROL XL) 2.5 MG 24 hr tablet Take 1 tablet (2.5 mg total) by mouth 2 (two) times daily. VISIT IS OVERDUE!!!!!  ? glucose blood test strip Use to check blood sugars twice daily (Patient taking differently: Check blood sugar every morning)  ? glycopyrrolate (ROBINUL) 1 MG tablet Take 1 mg by mouth at bedtime.  ? hydrALAZINE (APRESOLINE) 50 MG tablet Take 1 tablet (50 mg total) by mouth 2 (two) times daily. Annual appt due in July must see provider for future refills  ? isosorbide mononitrate (IMDUR) 30 MG 24 hr tablet Take 30 mg by mouth daily.  ? JARDIANCE 10 MG TABS tablet TAKE 1 TABLET BY MOUTH EVERY DAY  ? levETIRAcetam (KEPPRA) 500 MG tablet Take 500 mg by mouth at bedtime.  ? lisinopril (ZESTRIL) 20 MG tablet Take 1 tablet (20 mg total) by mouth daily. Needs office visit  ? Metamucil Fiber CHEW Chew 1 tablet by mouth daily.  ? metoprolol tartrate (LOPRESSOR) 25 MG tablet Take 1 tablet (25 mg total) by mouth 2 (two) times daily.  ? nitroGLYCERIN (NITROSTAT) 0.4 MG SL tablet Place 0.4 mg under the tongue every 5 (five) minutes as needed for chest pain.  ? ONE TOUCH ULTRA TEST test strip USE TO TEST TWICE DAILY (BEFORE BREAKFAST, BEDTIME) E11.4 (Patient taking differently: Check blood sugar every morning)  ? OneTouch Delica Lancets 91P MISC USE TO TEST BLOOD SUGAR TWICE DAILY (Patient taking differently: Check blood sugar every morning)  ? ONETOUCH ULTRA test strip USE TO CHECK BLOOD SUGAR TWICE DAILY  ? polyethylene glycol (MIRALAX / GLYCOLAX) packet Take 17 g by mouth daily as needed for moderate constipation.  ? vitamin E 180 MG (400 UNITS) capsule Take 400 Units by mouth daily.  ? ? ?Allergies:   Amlodipine,  Fish allergy, Penicillins, Shellfish allergy, Peanuts [peanut oil], and Tomato  ? ?Social History  ? ?Socioeconomic History  ? Marital status: Married  ?  Spouse name: Not on file  ? Number of children: 4  ? Years of education: 59  ? Highest education level: Not on file  ?Occupational History  ? Occupation: [employer]  ?  Comment: retired  ?Tobacco Use  ? Smoking status: Never  ? Smokeless tobacco: Never  ?Substance and Sexual Activity  ? Alcohol use: Yes  ?  Alcohol/week: 0.0 standard drinks  ?  Comment: 01/14/2015 "might have a glass of wine a few times/yr"  ? Drug use: No  ?  Sexual activity: Never  ?Other Topics Concern  ? Not on file  ?Social History Narrative  ? Married  ? Right handed  ? Caffeine use - 1 cup coffee daily  ? ?Social Determinants of Health  ? ?Financial Resource Strain: Not on file  ?Food Insecurity: Not on file  ?Transportation Needs: Not on file  ?Physical Activity: Not on file  ?Stress: Not on file  ?Social Connections: Not on file  ?  ? ?Family History:  The patient's family history includes Bell's palsy in her son; Hypertension in her father and sister; Prostate cancer in her father; Seizures in her sister.  ? ?ROS:   ?Please see the history of present illness.    ?ROS All other systems reviewed and are negative. ? ?   ? View : No data to display.  ?  ?  ?  ? ? ? ? ? ?PHYSICAL EXAM:   ?VS:  BP 129/80   Pulse 74   Ht 5' 6.5" (1.689 m)   Wt 172 lb (78 kg)   SpO2 98%   BMI 27.35 kg/m?    ?GEN: Well nourished, well developed in no acute distress ?HEENT: Normal ?NECK: No JVD; No carotid bruits ?LYMPHATICS: No lymphadenopathy ?CARDIAC:RRR, no murmurs, rubs, gallops ?RESPIRATORY:  Clear to auscultation without rales, wheezing or rhonchi  ?ABDOMEN: Soft, non-tender, non-distended ?MUSCULOSKELETAL:  1+ BLE edema; No deformity  ?SKIN: Warm and dry ?NEUROLOGIC:  Alert and oriented x 3 ?PSYCHIATRIC:  Normal affect   ?Wt Readings from Last 3 Encounters:  ?09/30/21 172 lb (78 kg)  ?06/01/21 162 lb  (73.5 kg)  ?05/11/21 170 lb 10.2 oz (77.4 kg)  ?  ? ? ?Studies/Labs Reviewed:  ? ? ?Recent Labs: ?05/04/2021: ALT 18; Magnesium 2.5; TSH 1.260 ?05/07/2021: Hemoglobin 12.0; Platelets 172 ?06/10/2021: BUN 23; Creat

## 2021-09-30 ENCOUNTER — Ambulatory Visit (INDEPENDENT_AMBULATORY_CARE_PROVIDER_SITE_OTHER): Payer: Medicare (Managed Care) | Admitting: Cardiology

## 2021-09-30 ENCOUNTER — Encounter: Payer: Self-pay | Admitting: Cardiology

## 2021-09-30 VITALS — BP 129/80 | HR 74 | Ht 66.5 in | Wt 172.0 lb

## 2021-09-30 DIAGNOSIS — E785 Hyperlipidemia, unspecified: Secondary | ICD-10-CM

## 2021-09-30 DIAGNOSIS — I442 Atrioventricular block, complete: Secondary | ICD-10-CM | POA: Diagnosis not present

## 2021-09-30 DIAGNOSIS — I10 Essential (primary) hypertension: Secondary | ICD-10-CM | POA: Diagnosis not present

## 2021-09-30 DIAGNOSIS — I2510 Atherosclerotic heart disease of native coronary artery without angina pectoris: Secondary | ICD-10-CM | POA: Diagnosis not present

## 2021-09-30 DIAGNOSIS — E1169 Type 2 diabetes mellitus with other specified complication: Secondary | ICD-10-CM

## 2021-09-30 NOTE — Patient Instructions (Signed)

## 2021-11-06 ENCOUNTER — Ambulatory Visit (INDEPENDENT_AMBULATORY_CARE_PROVIDER_SITE_OTHER): Payer: Medicare (Managed Care)

## 2021-11-06 DIAGNOSIS — I442 Atrioventricular block, complete: Secondary | ICD-10-CM | POA: Diagnosis not present

## 2021-11-06 LAB — CUP PACEART REMOTE DEVICE CHECK
Date Time Interrogation Session: 20230519071221
Implantable Lead Implant Date: 20221117
Implantable Lead Implant Date: 20221117
Implantable Lead Location: 753859
Implantable Lead Location: 753860
Implantable Lead Model: 377171
Implantable Lead Model: 377171
Implantable Lead Serial Number: 8000661393
Implantable Lead Serial Number: 800243000
Implantable Pulse Generator Implant Date: 20221117
Pulse Gen Model: 407145
Pulse Gen Serial Number: 70262712

## 2021-11-12 NOTE — Progress Notes (Signed)
Remote pacemaker transmission.   

## 2021-11-17 ENCOUNTER — Other Ambulatory Visit: Payer: Self-pay | Admitting: Internal Medicine

## 2021-12-20 ENCOUNTER — Other Ambulatory Visit: Payer: Self-pay | Admitting: Internal Medicine

## 2022-02-04 LAB — CUP PACEART REMOTE DEVICE CHECK
Date Time Interrogation Session: 20230816073103
Implantable Lead Implant Date: 20221117
Implantable Lead Implant Date: 20221117
Implantable Lead Location: 753859
Implantable Lead Location: 753860
Implantable Lead Model: 377171
Implantable Lead Model: 377171
Implantable Lead Serial Number: 8000661393
Implantable Lead Serial Number: 800243000
Implantable Pulse Generator Implant Date: 20221117
Pulse Gen Model: 407145
Pulse Gen Serial Number: 70262712

## 2022-02-05 ENCOUNTER — Ambulatory Visit (INDEPENDENT_AMBULATORY_CARE_PROVIDER_SITE_OTHER): Payer: Medicare (Managed Care)

## 2022-02-05 DIAGNOSIS — I442 Atrioventricular block, complete: Secondary | ICD-10-CM | POA: Diagnosis not present

## 2022-03-03 NOTE — Progress Notes (Signed)
Remote pacemaker transmission.   

## 2022-05-07 ENCOUNTER — Ambulatory Visit (INDEPENDENT_AMBULATORY_CARE_PROVIDER_SITE_OTHER): Payer: Medicare (Managed Care)

## 2022-05-07 DIAGNOSIS — I442 Atrioventricular block, complete: Secondary | ICD-10-CM

## 2022-05-07 LAB — CUP PACEART REMOTE DEVICE CHECK
Date Time Interrogation Session: 20231117063327
Implantable Lead Connection Status: 753985
Implantable Lead Connection Status: 753985
Implantable Lead Implant Date: 20221117
Implantable Lead Implant Date: 20221117
Implantable Lead Location: 753859
Implantable Lead Location: 753860
Implantable Lead Model: 377171
Implantable Lead Model: 377171
Implantable Lead Serial Number: 8000661393
Implantable Lead Serial Number: 800243000
Implantable Pulse Generator Implant Date: 20221117
Pulse Gen Model: 407145
Pulse Gen Serial Number: 70262712

## 2022-05-25 NOTE — Progress Notes (Signed)
Remote pacemaker transmission.   

## 2022-09-28 ENCOUNTER — Ambulatory Visit: Payer: Medicare PPO | Admitting: Cardiology

## 2022-10-06 ENCOUNTER — Ambulatory Visit: Payer: Medicare PPO | Attending: Cardiology | Admitting: Cardiology

## 2022-10-06 ENCOUNTER — Encounter: Payer: Self-pay | Admitting: Cardiology

## 2022-10-06 VITALS — BP 152/85 | HR 87 | Ht 66.5 in | Wt 177.0 lb

## 2022-10-06 DIAGNOSIS — I442 Atrioventricular block, complete: Secondary | ICD-10-CM

## 2022-10-06 DIAGNOSIS — I10 Essential (primary) hypertension: Secondary | ICD-10-CM | POA: Diagnosis not present

## 2022-10-06 DIAGNOSIS — Z950 Presence of cardiac pacemaker: Secondary | ICD-10-CM | POA: Diagnosis not present

## 2022-10-06 NOTE — Patient Instructions (Signed)
Medication Instructions:  Your physician recommends that you continue on your current medications as directed. Please refer to the Current Medication list given to you today.  *If you need a refill on your cardiac medications before your next appointment, please call your pharmacy*  Lab Work: None ordered.  If you have labs (blood work) drawn today and your tests are completely normal, you will receive your results only by: MyChart Message (if you have MyChart) OR A paper copy in the mail If you have any lab test that is abnormal or we need to change your treatment, we will call you to review the results.  Testing/Procedures: None ordered.  Follow-Up: At [facility], you and your health needs are our priority.  As part of our continuing mission to provide you with exceptional heart care, we have created designated Provider Care Teams.  These Care Teams include your primary Cardiologist (physician) and Advanced Practice Providers (APPs -  Physician Assistants and Nurse Practitioners) who all work together to provide you with the care you need, when you need it.  We recommend signing up for the patient portal called "MyChart".  Sign up information is provided on this After Visit Summary.  MyChart is used to connect with patients for Virtual Visits (Telemedicine).  Patients are able to view lab/test results, encounter notes, upcoming appointments, etc.  Non-urgent messages can be sent to your provider as well.   To learn more about what you can do with MyChart, go to [email or web address].    Your next appointment:   1 year(s)  The format for your next appointment:   In Person  Provider:   Cameron Lambert, MD{or one of the following Advanced Practice Providers on your designated Care Team:          

## 2022-10-06 NOTE — Progress Notes (Signed)
Electrophysiology Office Follow up Visit Note:    Date:  10/06/2022   ID:  Kayla [name], DOB [birth year], MRN [id number]  PCP:  Hillery Aldo, NP  [facility] Cardiologist:  Armanda Magic, MD  [facility] Electrophysiologist:  Lanier Prude, MD    Interval History:    Kayla [name] is a 82 y.o. female who presents for a follow up visit of their BIO PPM implanted 05/07/2021 due to complete heart block. During her hospitalization, she had an elevated hs troponin of 553 thought to be demand ischemia in the setting of AKI and bradycardia.    She was last seen in cardiology 09/30/2021 by Dr. Mayford Knife and was doing well.  Today, she is accompanied by a family member. She states she has been feeling "a little slower" than she used to be. She denies feeling any discomfort associated with her device.  She denies any palpitations, chest pain, shortness of breath, or peripheral edema. No lightheadedness, headaches, syncope, orthopnea, or PND.   Past medical, surgical, social and family histories reviewed.  ROS:   Please see the history of present illness.    All other systems reviewed and are negative.  EKGs/Labs/Other Studies Reviewed:    The following studies were reviewed today:  10/06/2022 In clinic device interrogation personally reviewed: Battery longevity 7 years 2 months Lead parameter stable Atrial pacing 5% Ventricular pacing 100% 0% atrial arrhythmia burden.  One minute long episode on September 07, 2022 at 10:14 PM  EKG:  EKG is personally reviewed.  10/06/2022: Sinus rhythm, ventricular pacing.  Paced QRS is approximately 140 ms.    Physical Exam:    VS:  BP (!) 152/85 (BP Location: Left Arm, Patient Position: Sitting, Cuff Size: Normal)   Pulse 87   Ht 5' 6.5" (1.689 m)   Wt 177 lb (80.3 kg)   BMI 28.14 kg/m     Wt Readings from Last 3 Encounters:  10/06/22 177 lb (80.3 kg)  09/30/21 172 lb (78 kg)  06/01/21 162 lb (73.5 kg)     GEN: Well nourished, well  developed in no acute distress CARDIAC: RRR, no murmurs, rubs, gallops. Device pocket well healed. PSYCHIATRIC:  Normal affect        ASSESSMENT:    1. Complete heart block   2. Pacemaker   3. Cardiac pacemaker in situ   4. Primary hypertension    PLAN:    In order of problems listed above:   #Complete heart block #Permanent pacemaker in situ Device functioning appropriately.  Continue remote monitoring.  #Hypertension Above goal today.  Recommend checking blood pressures 1-2 times per week at home and recording the values.  Recommend bringing these recordings to the primary care physician.   Follow-up in 1 year.    Medication Adjustments/Labs and Tests Ordered: Current medicines are reviewed at length with the patient today.  Concerns regarding medicines are outlined above.   Orders Placed This Encounter  Procedures   EKG 12-Lead   No orders of the defined types were placed in this encounter.   I,Mathew Stumpf,acting as a Neurosurgeon for Lanier Prude, MD.,have documented all relevant documentation on the behalf of Lanier Prude, MD,as directed by  Lanier Prude, MD while in the presence of Lanier Prude, MD.  I, Lanier Prude, MD, have reviewed all documentation for this visit. The documentation on 10/06/22 for the exam, diagnosis, procedures, and orders are all accurate and complete.  Signed, Steffanie Dunn, MD, [facility], [facility] 10/06/2022  9:07 PM    Electrophysiology [facility]

## 2022-11-05 ENCOUNTER — Ambulatory Visit (INDEPENDENT_AMBULATORY_CARE_PROVIDER_SITE_OTHER): Payer: Medicare PPO

## 2022-11-05 DIAGNOSIS — I442 Atrioventricular block, complete: Secondary | ICD-10-CM

## 2022-11-05 LAB — CUP PACEART REMOTE DEVICE CHECK
Battery Voltage: 85
Date Time Interrogation Session: 20240517062329
Implantable Lead Connection Status: 753985
Implantable Lead Connection Status: 753985
Implantable Lead Implant Date: 20221117
Implantable Lead Implant Date: 20221117
Implantable Lead Location: 753859
Implantable Lead Location: 753860
Implantable Lead Model: 377171
Implantable Lead Model: 377171
Implantable Lead Serial Number: 8000661393
Implantable Lead Serial Number: 800243000
Implantable Pulse Generator Implant Date: 20221117
Pulse Gen Model: 407145
Pulse Gen Serial Number: 70262712

## 2022-11-17 NOTE — Progress Notes (Signed)
Remote pacemaker transmission.   

## 2023-02-04 ENCOUNTER — Telehealth: Payer: Self-pay

## 2023-02-04 ENCOUNTER — Ambulatory Visit: Payer: Medicare (Managed Care)

## 2023-02-04 NOTE — Telephone Encounter (Signed)
Erroneous encounter

## 2023-02-11 ENCOUNTER — Telehealth: Payer: Self-pay

## 2023-02-11 NOTE — Telephone Encounter (Signed)
LMTCB to patient.   Biotronik remote monitor is not connected.

## 2023-02-15 NOTE — Telephone Encounter (Signed)
Connected 02/15/23.

## 2023-05-06 ENCOUNTER — Ambulatory Visit (INDEPENDENT_AMBULATORY_CARE_PROVIDER_SITE_OTHER): Payer: Medicare PPO

## 2023-05-06 DIAGNOSIS — I442 Atrioventricular block, complete: Secondary | ICD-10-CM | POA: Diagnosis not present

## 2023-05-07 LAB — CUP PACEART REMOTE DEVICE CHECK
Battery Remaining Percentage: 85 %
Brady Statistic RA Percent Paced: 2 %
Brady Statistic RV Percent Paced: 100 %
Date Time Interrogation Session: 20241115084105
Implantable Lead Connection Status: 753985
Implantable Lead Connection Status: 753985
Implantable Lead Implant Date: 20221117
Implantable Lead Implant Date: 20221117
Implantable Lead Location: 753859
Implantable Lead Location: 753860
Implantable Lead Model: 377171
Implantable Lead Model: 377171
Implantable Lead Serial Number: 8000661393
Implantable Lead Serial Number: 800243000
Implantable Pulse Generator Implant Date: 20221117
Lead Channel Impedance Value: 546 Ohm
Lead Channel Impedance Value: 605 Ohm
Lead Channel Pacing Threshold Amplitude: 0.7 V
Lead Channel Pacing Threshold Amplitude: 0.9 V
Lead Channel Pacing Threshold Pulse Width: 0.4 ms
Lead Channel Pacing Threshold Pulse Width: 0.4 ms
Lead Channel Sensing Intrinsic Amplitude: 1.2 mV
Lead Channel Sensing Intrinsic Amplitude: 18.1 mV
Lead Channel Setting Pacing Amplitude: 3 V
Lead Channel Setting Pacing Amplitude: 3 V
Lead Channel Setting Pacing Pulse Width: 0.4 ms
Pulse Gen Model: 407145
Pulse Gen Serial Number: 70262712

## 2023-05-18 NOTE — Progress Notes (Signed)
Remote pacemaker transmission.   

## 2023-11-04 ENCOUNTER — Ambulatory Visit (INDEPENDENT_AMBULATORY_CARE_PROVIDER_SITE_OTHER): Payer: Medicare (Managed Care)

## 2023-11-04 DIAGNOSIS — I442 Atrioventricular block, complete: Secondary | ICD-10-CM

## 2023-11-04 LAB — CUP PACEART REMOTE DEVICE CHECK
Battery Voltage: 80
Date Time Interrogation Session: 20250516074707
Implantable Lead Connection Status: 753985
Implantable Lead Connection Status: 753985
Implantable Lead Implant Date: 20221117
Implantable Lead Implant Date: 20221117
Implantable Lead Location: 753859
Implantable Lead Location: 753860
Implantable Lead Model: 377171
Implantable Lead Model: 377171
Implantable Lead Serial Number: 8000661393
Implantable Lead Serial Number: 800243000
Implantable Pulse Generator Implant Date: 20221117
Pulse Gen Model: 407145
Pulse Gen Serial Number: 70262712

## 2023-11-06 ENCOUNTER — Ambulatory Visit: Payer: Self-pay | Admitting: Cardiology

## 2023-11-15 NOTE — Progress Notes (Unsigned)
  Electrophysiology Office Note:   ID:  Kayla [name], DOB 11/26/1940, MRN [id number]  Primary Cardiologist: Gaylyn Keas, MD Electrophysiologist: Boyce Byes, MD  {Click to update primary MD,subspecialty MD or APP then REFRESH:1}    History of Present Illness:   Kayla [name] is a 83 y.o. female with h/o CHB s/p PPM and HTN seen today for routine electrophysiology followup.   Since last being seen in our clinic the patient reports doing ***.  she denies chest pain, palpitations, dyspnea, PND, orthopnea, nausea, vomiting, dizziness, syncope, edema, weight gain, or early satiety.   Review of systems complete and found to be negative unless listed in HPI.   EP Information / Studies Reviewed:    EKG is ordered today. Personal review as below.       PPM Interrogation-  reviewed in detail today,  See PACEART report.  Arrhythmia/Device History EXPLANTED-ILR-Medtronic-CarelinK Biotronik dual chamber PPM implanted 05/07/2021 for CHB     Physical Exam:   VS:  There were no vitals taken for this visit.   Wt Readings from Last 3 Encounters:  10/06/22 177 lb (80.3 kg)  09/30/21 172 lb (78 kg)  06/01/21 162 lb (73.5 kg)     GEN: No acute distress  NECK: No JVD; No carotid bruits CARDIAC: {EPRHYTHM:28826}, no murmurs, rubs, gallops RESPIRATORY:  Clear to auscultation without rales, wheezing or rhonchi  ABDOMEN: Soft, non-tender, non-distended EXTREMITIES:  {EDEMA LEVEL:28147::"No"} edema; No deformity   ASSESSMENT AND PLAN:    CHB s/p Biotronik PPM  Normal PPM function See Pace Art report No changes today  HTN Stable on current regimen   {Click here to Review PMH, Prob List, Meds, Allergies, SHx, FHx  :1}   Disposition:   Follow up with {EPPROVIDERS:28135} {EPFOLLOW UP:28173}  Signed, Tylene Galla, PA-C

## 2023-11-16 ENCOUNTER — Ambulatory Visit: Payer: Self-pay | Admitting: Cardiology

## 2023-11-16 ENCOUNTER — Encounter: Payer: Self-pay | Admitting: Student

## 2023-11-16 ENCOUNTER — Ambulatory Visit: Attending: Student | Admitting: Student

## 2023-11-16 VITALS — BP 114/62 | HR 68 | Ht 66.5 in | Wt 154.6 lb

## 2023-11-16 DIAGNOSIS — I442 Atrioventricular block, complete: Secondary | ICD-10-CM | POA: Diagnosis not present

## 2023-11-16 DIAGNOSIS — Z950 Presence of cardiac pacemaker: Secondary | ICD-10-CM | POA: Diagnosis not present

## 2023-11-16 DIAGNOSIS — I10 Essential (primary) hypertension: Secondary | ICD-10-CM | POA: Diagnosis not present

## 2023-11-16 LAB — CUP PACEART INCLINIC DEVICE CHECK
Battery Remaining Percentage: 80 %
Brady Statistic RA Percent Paced: 2 %
Brady Statistic RV Percent Paced: 100 %
Date Time Interrogation Session: 20250528115644
Implantable Lead Connection Status: 753985
Implantable Lead Connection Status: 753985
Implantable Lead Implant Date: 20221117
Implantable Lead Implant Date: 20221117
Implantable Lead Location: 753859
Implantable Lead Location: 753860
Implantable Lead Model: 377171
Implantable Lead Model: 377171
Implantable Lead Serial Number: 8000661393
Implantable Lead Serial Number: 800243000
Implantable Pulse Generator Implant Date: 20221117
Lead Channel Impedance Value: 585 Ohm
Lead Channel Impedance Value: 604 Ohm
Lead Channel Pacing Threshold Amplitude: 0.7 V
Lead Channel Pacing Threshold Amplitude: 0.9 V
Lead Channel Pacing Threshold Pulse Width: 0.4 ms
Lead Channel Pacing Threshold Pulse Width: 0.4 ms
Lead Channel Setting Pacing Amplitude: 1.2 V
Lead Channel Setting Pacing Amplitude: 1.9 V
Lead Channel Setting Pacing Pulse Width: 0.4 ms
Pulse Gen Model: 407145
Pulse Gen Serial Number: 70262712

## 2023-11-16 NOTE — Patient Instructions (Signed)
 Medication Instructions:  No medication changes today. *If you need a refill on your cardiac medications before your next appointment, please call your pharmacy*  Lab Work: No labwork ordered today. If you have labs (blood work) drawn today and your tests are completely normal, you will receive your results only by: MyChart Message (if you have MyChart) OR A paper copy in the mail If you have any lab test that is abnormal or we need to change your treatment, we will call you to review the results.  Testing/Procedures: No testing ordered today  Follow-Up: At [facility], you and your health needs are our priority.  As part of our continuing mission to provide you with exceptional heart care, our providers are all part of one team.  This team includes your primary Cardiologist (physician) and Advanced Practice Providers or APPs (Physician Assistants and Nurse Practitioners) who all work together to provide you with the care you need, when you need it.  Your next appointment:   12 month(s)  Provider:   You may see Boyce Byes, MD or one of the following Advanced Practice Providers on your designated Care Team:   Mertha Abrahams, [place] Bambi Lever "Jonelle Neri" Willard, PA-C Suzann Riddle, NP Creighton Doffing, NP    We recommend signing up for the patient portal called "MyChart".  Sign up information is provided on this After Visit Summary.  MyChart is used to connect with patients for Virtual Visits (Telemedicine).  Patients are able to view lab/test results, encounter notes, upcoming appointments, etc.  Non-urgent messages can be sent to your provider as well.   To learn more about what you can do with MyChart, go to [email or web address].

## 2023-12-08 NOTE — Progress Notes (Signed)
 Remote pacemaker transmission.

## 2024-01-27 ENCOUNTER — Encounter: Payer: Self-pay | Admitting: Cardiology

## 2024-01-27 ENCOUNTER — Ambulatory Visit: Attending: Cardiology | Admitting: Cardiology

## 2024-01-27 VITALS — BP 134/76 | HR 85 | Ht 66.5 in | Wt 158.2 lb

## 2024-01-27 DIAGNOSIS — I2510 Atherosclerotic heart disease of native coronary artery without angina pectoris: Secondary | ICD-10-CM | POA: Diagnosis not present

## 2024-01-27 DIAGNOSIS — E785 Hyperlipidemia, unspecified: Secondary | ICD-10-CM

## 2024-01-27 DIAGNOSIS — I442 Atrioventricular block, complete: Secondary | ICD-10-CM | POA: Diagnosis not present

## 2024-01-27 DIAGNOSIS — I10 Essential (primary) hypertension: Secondary | ICD-10-CM

## 2024-01-27 NOTE — Patient Instructions (Signed)
 Medication Instructions:  Your physician recommends that you continue on your current medications as directed. Please refer to the Current Medication list given to you today.  *If you need a refill on your cardiac medications before your next appointment, please call your pharmacy*  Lab Work: Please have your PCP send us  a copy of your last fasting lipid panel and CMET. Our fax # is [phone].  If you have labs (blood work) drawn today and your tests are completely normal, you will receive your results only by: MyChart Message (if you have MyChart) OR A paper copy in the mail If you have any lab test that is abnormal or we need to change your treatment, we will call you to review the results.  Testing/Procedures: None.  Follow-Up: At [facility], you and your health needs are our priority.  As part of our continuing mission to provide you with exceptional heart care, our providers are all part of one team.  This team includes your primary Cardiologist (physician) and Advanced Practice Providers or APPs (Physician Assistants and Nurse Practitioners) who all work together to provide you with the care you need, when you need it.  Your next appointment:   1 year(s)  Provider:   Wilbert Bihari, MD    We recommend signing up for the patient portal called MyChart.  Sign up information is provided on this After Visit Summary.  MyChart is used to connect with patients for Virtual Visits (Telemedicine).  Patients are able to view lab/test results, encounter notes, upcoming appointments, etc.  Non-urgent messages can be sent to your provider as well.   To learn more about what you can do with MyChart, go to [email or web address].

## 2024-01-27 NOTE — Progress Notes (Signed)
 Cardiology Office Note    Date:  01/27/2024   ID:  Kayla [name], DOB 1941-01-26, MRN [id number]  PCP:  Campbell Reynolds, NP  Cardiologist:  Wilbert Bihari, MD   Chief Complaint  Patient presents with   Coronary Artery Disease   Hypertension   Hyperlipidemia    History of Present Illness:  Kayla [name] is a 83 y.o. female with a hx of ASCAD with cath in 2016 showing 40% ostial RCA, 40% ostial D1, 90% proximal D1, 85% and 70% sequential distal LAD stenosis and underwent PCI with stents in the D1 and LAD.  She also has a hx of GERD, HLD, HTN, DM2 and sickle cell trait as well as medical noncompliance.  She has a history of complete heart block status post permanent pacemaker placement 04/2021, breast TIA and CVA as well.  Nuclear stress test and 2020 showed no ischemia and EF 75%.  She is here today for follow-up and is doing well.  She denies any chest pain or pressure, lower extremity edema, palpitations, dizziness or syncope. She has mild DOE that she thinks is stable.   Past Medical History:  Diagnosis Date   Allergy    Shell fish   Anxiety    Arthritis    knees, back, probably left hand (01/14/2015)   Asthmatic bronchitis    Blind left eye    CAD (coronary artery disease)    cath in 2016 showing 40% ostial RCA, 40% ostial D1, 90% proximal D1, 85% and 70% sequential distal LAD stenosis and underwent PCI with stents in the D1 and LAD.   Constipation    Depression    Diverticulosis of colon    DJD (degenerative joint disease)    Fibromyalgia    GERD (gastroesophageal reflux disease)    Heart murmur    History of gout    Hypercholesteremia    Hypertension    Personal history of noncompliance with medical treatment, presenting hazards to health    Prolapse of vaginal vault after hysterectomy    Proteinuria    Sickle-cell trait (HCC)    Somatic dysfunction    Stroke (HCC) ~ 08/2014   blind in left eye; weak on left side since (01/14/2015)   Type II diabetes mellitus  (HCC)    Venous insufficiency     Past Surgical History:  Procedure Laterality Date   ABDOMINAL HYSTERECTOMY     CARDIAC CATHETERIZATION N/A 01/14/2015   Procedure: Left Heart Cath and Coronary Angiography;  Surgeon: Rober Chroman, MD;  Location: [facility] INVASIVE CV LAB;  Service: Cardiovascular;  Laterality: N/A;   CARDIAC CATHETERIZATION  ~ 2011   CATARACT EXTRACTION W/ INTRAOCULAR LENS  IMPLANT, BILATERAL Bilateral 2012   CORONARY ANGIOPLASTY     DILATION AND CURETTAGE OF UTERUS  probably   LOOP RECORDER IMPLANT N/A 09/03/2014   Procedure: LOOP RECORDER IMPLANT;  Surgeon: Lynwood Rakers, MD;  Location: [facility] CATH LAB;  Service: Cardiovascular;  Laterality: N/A;   PACEMAKER IMPLANT N/A 05/07/2021   Procedure: PACEMAKER IMPLANT;  Surgeon: Cindie Ole DASEN, MD;  Location: [facility] INVASIVE CV LAB;  Service: Cardiovascular;  Laterality: N/A;   ROBOTIC ASSISTED LAPAROSCOPIC SACROCOLPOPEXY  09/2009   Dr. Gaston   TEE WITHOUT CARDIOVERSION N/A 09/02/2014   Procedure: TRANSESOPHAGEAL ECHOCARDIOGRAM (TEE);  Surgeon: Redell GORMAN Shallow, MD;  Location: [facility] ENDOSCOPY;  Service: Cardiovascular;  Laterality: N/A;   TEMPORARY PACEMAKER N/A 05/04/2021   Procedure: TEMPORARY PACEMAKER;  Surgeon: Wonda Sharper, MD;  Location: [facility] INVASIVE CV LAB;  Service:  Cardiovascular;  Laterality: N/A;    Current Medications: Current Meds  Medication Sig   acetaminophen  (TYLENOL ) 500 MG tablet Take 1,000 mg by mouth every 6 (six) hours as needed for moderate pain or headache.   atorvastatin  (LIPITOR) 20 MG tablet Take 1 tablet (20 mg total) by mouth daily at 6 PM.   blood glucose meter kit and supplies KIT Dispense what is covered by patients insurance with strips and lancets. Use twice a day (before breakfast and at bedtime). E11.40   cetirizine (ZYRTEC) 10 MG tablet Take 10 mg by mouth at bedtime.   clopidogrel  (PLAVIX ) 75 MG tablet Take 1 tablet (75 mg total) by mouth daily. OFFICE VISIT IS DUE   Cyanocobalamin  (VITAMIN  B-12) 2500 MCG SUBL Place 2,500 mcg under the tongue daily.   dextromethorphan -guaiFENesin  (MUCINEX  DM) 30-600 MG 12hr tablet Take 1 tablet by mouth 2 (two) times daily as needed for cough.   glipiZIDE  (GLUCOTROL  XL) 2.5 MG 24 hr tablet Take 1 tablet (2.5 mg total) by mouth 2 (two) times daily. VISIT IS OVERDUE!!!!!   glucose blood test strip Use to check blood sugars twice daily   glycopyrrolate (ROBINUL) 1 MG tablet Take 1 mg by mouth at bedtime.   hydrALAZINE  (APRESOLINE ) 50 MG tablet Take 1 tablet (50 mg total) by mouth 2 (two) times daily. Annual appt due in July must see provider for future refills   isosorbide  mononitrate (IMDUR ) 30 MG 24 hr tablet Take 30 mg by mouth daily.   JARDIANCE  10 MG TABS tablet TAKE 1 TABLET BY MOUTH EVERY DAY   levETIRAcetam  (KEPPRA ) 500 MG tablet Take 500 mg by mouth at bedtime.   lisinopril  (ZESTRIL ) 20 MG tablet Take 1 tablet (20 mg total) by mouth daily. Needs office visit   Metamucil Fiber CHEW Chew 1 tablet by mouth daily.   metoprolol  tartrate (LOPRESSOR ) 25 MG tablet Take 1 tablet (25 mg total) by mouth 2 (two) times daily.   nitroGLYCERIN  (NITROSTAT ) 0.4 MG SL tablet Place 0.4 mg under the tongue every 5 (five) minutes as needed for chest pain.   ONE TOUCH ULTRA TEST test strip USE TO TEST TWICE DAILY (BEFORE BREAKFAST, BEDTIME) E11.4 (Patient taking differently: No sig reported)   OneTouch Delica Lancets 30G MISC USE TO TEST BLOOD SUGAR TWICE DAILY (Patient taking differently: No sig reported)   ONETOUCH ULTRA test strip USE TO CHECK BLOOD SUGAR TWICE DAILY   polyethylene glycol (MIRALAX  / GLYCOLAX ) packet Take 17 g by mouth daily as needed for moderate constipation.   sertraline  (ZOLOFT ) 25 MG tablet Take 25 mg by mouth at bedtime.   vitamin E 180 MG (400 UNITS) capsule Take 400 Units by mouth daily.    Allergies:   Amlodipine, Fish allergy, Penicillins, Shellfish allergy, Peanuts [peanut oil], and Tomato   Social History   Socioeconomic History    Marital status: Married    Spouse name: Not on file   Number of children: 4   Years of education: 13   Highest education level: Not on file  Occupational History   Occupation: [employer]    Comment: retired  Tobacco Use   Smoking status: Never   Smokeless tobacco: Never  Substance and Sexual Activity   Alcohol use: Yes    Alcohol/week: 0.0 standard drinks of alcohol    Comment: 01/14/2015 might have a glass of wine a few times/yr   Drug use: No   Sexual activity: Never  Other Topics Concern   Not on file  Social History  Narrative   Married   Right handed   Caffeine use - 1 cup coffee daily   Social Drivers of Corporate investment banker Strain: Low Risk  (05/10/2019)   Overall Financial Resource Strain (CARDIA)    Difficulty of Paying Living Expenses: Not very hard  Food Insecurity: No Food Insecurity (05/10/2019)   Hunger Vital Sign    Worried About Running Out of Food in the Last Year: Never true    Ran Out of Food in the Last Year: Never true  Transportation Needs: No Transportation Needs (05/10/2019)   PRAPARE - Administrator, Civil Service (Medical): No    Lack of Transportation (Non-Medical): No  Physical Activity: Not on file  Stress: Not on file  Social Connections: Not on file     Family History:  The patient's family history includes Bell's palsy in her son; Hypertension in her father and sister; Prostate cancer in her father; Seizures in her sister.   ROS:   Please see the history of present illness.    ROS All other systems reviewed and are negative.      No data to display             PHYSICAL EXAM:   VS:  BP 134/76   Pulse 85   Ht 5' 6.5 (1.689 m)   Wt 158 lb 3.2 oz (71.8 kg)   SpO2 96%   BMI 25.15 kg/m    GEN: Well nourished, well developed in no acute distress HEENT: Normal NECK: No JVD; No carotid bruits LYMPHATICS: No lymphadenopathy CARDIAC:RRR, no murmurs, rubs, gallops RESPIRATORY:  Clear to auscultation  without rales, wheezing or rhonchi  ABDOMEN: Soft, non-tender, non-distended MUSCULOSKELETAL:  trace  edema; No deformity  SKIN: Warm and dry NEUROLOGIC:  Alert and oriented x 3 PSYCHIATRIC:  Normal affect  Wt Readings from Last 3 Encounters:  01/27/24 158 lb 3.2 oz (71.8 kg)  11/16/23 154 lb 9.6 oz (70.1 kg)  10/06/22 177 lb (80.3 kg)      Studies/Labs Reviewed:    Recent Labs: No results found for requested labs within last 365 days.   Lipid Panel    Component Value Date/Time   CHOL 114 06/01/2021 1057   TRIG 173 (H) 06/01/2021 1057   HDL 31 (L) 06/01/2021 1057   CHOLHDL 3.7 06/01/2021 1057   CHOLHDL 3 12/29/2018 1207   VLDL 27.2 12/29/2018 1207   LDLCALC 54 06/01/2021 1057    Additional studies/ records that were reviewed today include:  Office notes from PCP    ASSESSMENT:    1. Coronary artery disease involving native coronary artery of native heart without angina pectoris   2. Primary hypertension   3. Hyperlipidemia LDL goal <70   4. Complete heart block (HCC)       PLAN:  In order of problems listed above:  ASCAD -cath in 2016 showing 40% ostial RCA, 40% ostial D1, 90% proximal D1, 85% and 70% sequential distal LAD stenosis and underwent PCI with stents in the D1 and LAD. -Lexiscan  Myoview  showed no ischemia 05/2019 -She is doing well and denies any anginal symptoms since I saw her last - Continue atorvastatin  20 mg daily, Plavix  75 mg daily, Imdur  30 mg daily, Lopressor  25 mg twice daily with as needed refills  HTN - Controlled on exam today - Continue Lopressor  25 mg twice daily, lisinopril  20 mg daily, Imdur  30 mg daily, hydralazine  50 mg twice daily with as needed refills -I will get a  copy of labs from PCP  HLD -LDL goal < 70 -I will get a copy of her last lipids from PCP -Continue atorvastatin  20 mg daily with as needed refills  Complete heart block/second-degree AV block 2-1 with left bundle branch block -Status post permanent pacemaker  04/2021 -Followed by EP  Medication Adjustments/Labs and Tests Ordered: Current medicines are reviewed at length with the patient today.  Concerns regarding medicines are outlined above.  Medication changes, Labs and Tests ordered today are listed in the Patient Instructions below.  There are no Patient Instructions on file for this visit.  Followup with me in 1 year  Signed, Wilbert Bihari, MD  01/27/2024 12:52 PM    [facility] [address] [place], [place], [place]  [address] Phone: [phone]; Fax: [phone]

## 2024-02-03 ENCOUNTER — Ambulatory Visit (INDEPENDENT_AMBULATORY_CARE_PROVIDER_SITE_OTHER)

## 2024-02-03 DIAGNOSIS — I442 Atrioventricular block, complete: Secondary | ICD-10-CM | POA: Diagnosis not present

## 2024-02-07 LAB — CUP PACEART REMOTE DEVICE CHECK
Date Time Interrogation Session: 20250815090035
Implantable Lead Connection Status: 753985
Implantable Lead Connection Status: 753985
Implantable Lead Implant Date: 20221117
Implantable Lead Implant Date: 20221117
Implantable Lead Location: 753859
Implantable Lead Location: 753860
Implantable Lead Model: 377171
Implantable Lead Model: 377171
Implantable Lead Serial Number: 8000661393
Implantable Lead Serial Number: 800243000
Implantable Pulse Generator Implant Date: 20221117
Pulse Gen Model: 407145
Pulse Gen Serial Number: 70262712

## 2024-02-10 ENCOUNTER — Ambulatory Visit: Payer: Self-pay | Admitting: Cardiology

## 2024-03-14 ENCOUNTER — Telehealth: Payer: Self-pay

## 2024-03-14 NOTE — Progress Notes (Signed)
 Remote PPM Transmission

## 2024-03-14 NOTE — Telephone Encounter (Signed)
-----   Message from Nurse Geni E sent at 01/27/2024  1:04 PM EDT ----- Regarding: FLP/CMET Requested flp/cmet from pcp 8/8

## 2024-03-14 NOTE — Telephone Encounter (Signed)
 Attempted to call and request records, no answer, unable to leave message.

## 2024-04-12 ENCOUNTER — Ambulatory Visit (INDEPENDENT_AMBULATORY_CARE_PROVIDER_SITE_OTHER): Admitting: Podiatry

## 2024-04-12 ENCOUNTER — Encounter: Payer: Self-pay | Admitting: Podiatry

## 2024-04-12 DIAGNOSIS — B351 Tinea unguium: Secondary | ICD-10-CM

## 2024-04-12 DIAGNOSIS — M79671 Pain in right foot: Secondary | ICD-10-CM

## 2024-04-12 DIAGNOSIS — L600 Ingrowing nail: Secondary | ICD-10-CM | POA: Diagnosis not present

## 2024-04-12 DIAGNOSIS — M79672 Pain in left foot: Secondary | ICD-10-CM | POA: Diagnosis not present

## 2024-04-12 NOTE — Progress Notes (Signed)
 Patient presents for evaluation and treatment of tenderness and some redness around nails feet.  Tenderness around toes with walking and wearing shoes.  Hallux nail left is particularly bothering her and very painful.  Son who accompanies her says been painful for several weeks now.  They have not noticed any drainage or bleeding.  Her HA1C is 8.7  Physical exam:  General appearance: Alert, pleasant, and in no acute distress.  Vascular: Pedal pulses: DP 1/4 B/L, PT 0/4 B/L. Mild edema lower legs bilaterally.  Capillary refill time immediate  Neurologic: Light touch intact diminished Achilles tendon reflex bilaterally no clonus or spasticity noted.  Dermatologic:  Nails thickened, disfigured, discolored 1-5 BL with subungual debris.  Redness and hypertrophic nail folds along nail folds bilaterally but no signs of drainage or infection.  Hallux nail left is extremely tender along the nail folds especially laterally.  Redness and swelling along the nail fold however no drainage was noted on the hallux left.  Musculoskeletal:  Hammertoes 2 through 5 bilaterally.  Hallux valgus deformity bilaterally.   Diagnosis: 1. Painful onychomycotic nails 1 through 5 bilaterally. 2. Pain toes 1 through 5 bilaterally. 3.  Ingrown nail hallux left  Plan: New patient office visit for evaluation and management level 3.  Modifier 25. -Discussed with the ingrown nail with severe pain hallux left.  All borders were deferred debrided today and this gave her great deal of relief.  Nailfold was very irritated and inflamed.  No signs of infection or breaking of skin.  If the nail continues to bother her after week or 2 with soaking we probably want to consider an avulsion of the nail borders to prevent this from getting worse.  Patient is a diabetic with an elevated HA1C and diminished pulses so would prefer to avoid any invasive procedures -Soak left foot twice daily for 15 minutes in lukewarm Epsom salt water.  Do  this for a week or 2 but if still painful they should call the office  -Debrided onychomycotic nails 1 through 5 bilaterally.  Sharply debrided nails with nail clipper and reduced with a power bur.  Return 3 months [facility]

## 2024-05-04 ENCOUNTER — Ambulatory Visit

## 2024-05-04 DIAGNOSIS — I442 Atrioventricular block, complete: Secondary | ICD-10-CM | POA: Diagnosis not present

## 2024-05-06 LAB — CUP PACEART REMOTE DEVICE CHECK
Date Time Interrogation Session: 20251114072515
Implantable Lead Connection Status: 753985
Implantable Lead Connection Status: 753985
Implantable Lead Implant Date: 20221117
Implantable Lead Implant Date: 20221117
Implantable Lead Location: 753859
Implantable Lead Location: 753860
Implantable Lead Model: 377171
Implantable Lead Model: 377171
Implantable Lead Serial Number: 8000661393
Implantable Lead Serial Number: 800243000
Implantable Pulse Generator Implant Date: 20221117
Pulse Gen Model: 407145
Pulse Gen Serial Number: 70262712

## 2024-05-07 NOTE — Progress Notes (Signed)
 Remote PPM Transmission

## 2024-05-08 ENCOUNTER — Ambulatory Visit: Payer: Self-pay | Admitting: Cardiology

## 2024-05-12 ENCOUNTER — Other Ambulatory Visit: Payer: Self-pay

## 2024-05-12 ENCOUNTER — Emergency Department (HOSPITAL_COMMUNITY)
Admission: EM | Admit: 2024-05-12 | Discharge: 2024-05-12 | Disposition: A | Discharge status: Home / Self Care | Attending: Emergency Medicine | Admitting: Emergency Medicine

## 2024-05-12 ENCOUNTER — Encounter (HOSPITAL_COMMUNITY): Payer: Self-pay | Admitting: Emergency Medicine

## 2024-05-12 ENCOUNTER — Emergency Department (HOSPITAL_COMMUNITY)

## 2024-05-12 DIAGNOSIS — I700 Atherosclerosis of aorta: Secondary | ICD-10-CM | POA: Insufficient documentation

## 2024-05-12 DIAGNOSIS — I2510 Atherosclerotic heart disease of native coronary artery without angina pectoris: Secondary | ICD-10-CM | POA: Diagnosis not present

## 2024-05-12 DIAGNOSIS — E119 Type 2 diabetes mellitus without complications: Secondary | ICD-10-CM | POA: Diagnosis not present

## 2024-05-12 DIAGNOSIS — K4090 Unilateral inguinal hernia, without obstruction or gangrene, not specified as recurrent: Secondary | ICD-10-CM | POA: Diagnosis not present

## 2024-05-12 DIAGNOSIS — N200 Calculus of kidney: Secondary | ICD-10-CM | POA: Diagnosis not present

## 2024-05-12 DIAGNOSIS — M545 Low back pain, unspecified: Secondary | ICD-10-CM | POA: Diagnosis present

## 2024-05-12 DIAGNOSIS — I10 Essential (primary) hypertension: Secondary | ICD-10-CM | POA: Diagnosis not present

## 2024-05-12 DIAGNOSIS — M549 Dorsalgia, unspecified: Secondary | ICD-10-CM

## 2024-05-12 LAB — BASIC METABOLIC PANEL WITH GFR
Anion gap: 9 (ref 5–15)
BUN: 42 mg/dL — ABNORMAL HIGH (ref 8–23)
CO2: 21 mmol/L — ABNORMAL LOW (ref 22–32)
Calcium: 9.4 mg/dL (ref 8.9–10.3)
Chloride: 112 mmol/L — ABNORMAL HIGH (ref 98–111)
Creatinine, Ser: 2.02 mg/dL — ABNORMAL HIGH (ref 0.44–1.00)
GFR, Estimated: 24 mL/min — ABNORMAL LOW (ref >60)
Glucose, Bld: 123 mg/dL — ABNORMAL HIGH (ref 70–99)
Potassium: 5.1 mmol/L (ref 3.5–5.1)
Sodium: 142 mmol/L (ref 135–145)

## 2024-05-12 LAB — CBC
HCT: 39.9 % (ref 36.0–46.0)
Hemoglobin: 12.6 g/dL (ref 12.0–15.0)
MCH: 29.8 pg (ref 26.0–34.0)
MCHC: 31.6 g/dL (ref 30.0–36.0)
MCV: 94.3 fL (ref 80.0–100.0)
Platelets: 194 K/uL (ref 150–400)
RBC: 4.23 MIL/uL (ref 3.87–5.11)
RDW: 16.8 % — ABNORMAL HIGH (ref 11.5–15.5)
WBC: 6.1 K/uL (ref 4.0–10.5)
nRBC: 0 % (ref 0.0–0.2)

## 2024-05-12 MED ORDER — MORPHINE SULFATE (PF) 4 MG/ML IV SOLN
4.0000 mg | Freq: Once | INTRAVENOUS | Status: AC
  Administered 2024-05-12: 4 mg via INTRAVENOUS
  Filled 2024-05-12 (×2): qty 1

## 2024-05-12 MED ORDER — ONDANSETRON HCL 4 MG/2ML IJ SOLN
4.0000 mg | Freq: Once | INTRAMUSCULAR | Status: AC
  Administered 2024-05-12: 4 mg via INTRAVENOUS
  Filled 2024-05-12 (×2): qty 2

## 2024-05-12 MED ORDER — OXYCODONE HCL 5 MG PO TABS: 5.0000 mg | ORAL_TABLET | ORAL | 0 refills | Status: AC | PRN

## 2024-05-12 MED ORDER — LIDOCAINE 5 % EX PTCH: 1.0000 | MEDICATED_PATCH | CUTANEOUS | 0 refills | Status: AC

## 2024-05-12 MED ORDER — LACTATED RINGERS IV BOLUS
500.0000 mL | Freq: Once | INTRAVENOUS | Status: AC
  Administered 2024-05-12: 500 mL via INTRAVENOUS

## 2024-05-12 NOTE — ED Triage Notes (Signed)
 Pt BIB EMS from home, c/o left lower back pain x 2 days. Progressively worse and tender to touch. Reported 10/10 pain upon standing and moving. History of dementia.  BP 142/72 P 78 SpO2 98% CBG 142

## 2024-05-12 NOTE — ED Provider Notes (Signed)
 [place] [facility] AT [facility] Provider Note   CSN: [id number] Arrival date & time: 05/12/24  1358     Patient presents with: Back Pain   Kayla [name] is a 83 y.o. female.  {Add pertinent medical, surgical, social history, OB history to HPI:32947}  Back Pain    Patient has a history of bronchitis, hypertension, hypercholesterolemia, constipation, fibromyalgia, anxiety, acid reflux, diabetes, prior stroke, coronary artery disease.  Patient presents ED with complaints of low back pain.  Patient states she has had the symptoms ongoing for this past week.  The symptoms been getting worse.  Patient states the pain is sharp and increases with certain movements and position.  Family states she went to her doctor's office and had a urine test that was negative.  They thought it might be a pulled muscle.  Patient does have a bar in her bed to help her get out.  They think maybe she possibly may have strained it.  She has been taking Tylenol .  Pain increased so she came to the ED.  She is not having any chest pain.  No shortness of breath.  No abdominal pain.  No numbness or weakness  Prior to Admission medications   Medication Sig Start Date End Date Taking? Authorizing Provider  lidocaine  (LIDODERM ) 5 % Place 1 patch onto the skin daily. Remove & Discard patch within 12 hours or as directed by MD 05/12/24  Yes Randol Simmonds, MD  oxyCODONE  (ROXICODONE ) 5 MG immediate release tablet Take 1 tablet (5 mg total) by mouth every 4 (four) hours as needed for severe pain (pain score 7-10). 05/12/24  Yes Randol Simmonds, MD  acetaminophen  (TYLENOL ) 500 MG tablet Take 1,000 mg by mouth every 6 (six) hours as needed for moderate pain or headache.    [provider]  atorvastatin  (LIPITOR) 20 MG tablet Take 1 tablet (20 mg total) by mouth daily at 6 PM. 02/19/15   Rollene Almarie LABOR, MD  blood glucose meter kit and supplies KIT Dispense what is covered by patients insurance with  strips and lancets. Use twice a day (before breakfast and at bedtime). E11.40 01/26/19   Rollene Almarie LABOR, MD  cetirizine (ZYRTEC) 10 MG tablet Take 10 mg by mouth at bedtime.    [provider]  clopidogrel  (PLAVIX ) 75 MG tablet Take 1 tablet (75 mg total) by mouth daily. OFFICE VISIT IS DUE 05/12/21   Bryn Bernardino NOVAK, MD  Cyanocobalamin  (VITAMIN B-12) 2500 MCG SUBL Place 2,500 mcg under the tongue daily.    [provider]  dextromethorphan -guaiFENesin  (MUCINEX  DM) 30-600 MG 12hr tablet Take 1 tablet by mouth 2 (two) times daily as needed for cough.    [provider]  glipiZIDE  (GLUCOTROL  XL) 2.5 MG 24 hr tablet Take 1 tablet (2.5 mg total) by mouth 2 (two) times daily. VISIT IS OVERDUE!!!!! 07/31/20   Rollene Almarie LABOR, MD  glucose blood test strip Use to check blood sugars twice daily 01/26/19   Rollene Almarie LABOR, MD  glycopyrrolate (ROBINUL) 1 MG tablet Take 1 mg by mouth at bedtime. 03/27/21   [provider]  hydrALAZINE  (APRESOLINE ) 50 MG tablet Take 1 tablet (50 mg total) by mouth 2 (two) times daily. Annual appt due in July must see provider for future refills 11/20/19   Rollene Almarie LABOR, MD  isosorbide  mononitrate (IMDUR ) 30 MG 24 hr tablet Take 30 mg by mouth daily. 02/13/21   [provider]  JARDIANCE  10 MG TABS tablet  TAKE 1 TABLET BY MOUTH EVERY DAY Patient not taking: Reported on 04/12/2024 02/23/19   Rollene Almarie LABOR, MD  levETIRAcetam  (KEPPRA ) 500 MG tablet Take 500 mg by mouth at bedtime. 02/25/21   [provider]  lisinopril  (ZESTRIL ) 20 MG tablet Take 1 tablet (20 mg total) by mouth daily. Needs office visit 06/23/20   Rollene Almarie LABOR, MD  Metamucil Fiber CHEW Chew 1 tablet by mouth daily.    [provider]  metoprolol  tartrate (LOPRESSOR ) 25 MG tablet Take 1 tablet (25 mg total) by mouth 2 (two) times daily. 05/11/21   Bryn Bernardino NOVAK, MD  nitroGLYCERIN  (NITROSTAT ) 0.4 MG SL tablet Place 0.4 mg under  the tongue every 5 (five) minutes as needed for chest pain.    [provider]  ONE TOUCH ULTRA TEST test strip USE TO TEST TWICE DAILY (BEFORE BREAKFAST, BEDTIME) E11.4 Patient taking differently: No sig reported 01/03/18   Rollene Almarie LABOR, MD  OneTouch Delica Lancets 30G MISC USE TO TEST BLOOD SUGAR TWICE DAILY Patient taking differently: No sig reported 07/11/19   Rollene Almarie LABOR, MD  [facility] ULTRA test strip USE TO CHECK BLOOD SUGAR TWICE DAILY 12/12/20   Rollene Almarie LABOR, MD  polyethylene glycol (MIRALAX  / GLYCOLAX ) packet Take 17 g by mouth daily as needed for moderate constipation.    [provider]  sertraline  (ZOLOFT ) 25 MG tablet Take 25 mg by mouth at bedtime. 01/11/24   [provider]  vitamin E 180 MG (400 UNITS) capsule Take 400 Units by mouth daily.    [provider]    Allergies: Amlodipine, Fish allergy, Penicillins, Shellfish allergy, Peanuts [peanut oil], and Tomato    Review of Systems  Musculoskeletal:  Positive for back pain.    Updated Vital Signs BP 128/72   Temp 98.1 F (36.7 C)   Ht 1.689 m (5' 6.5)   Wt 77.1 kg   SpO2 100%   BMI 27.03 kg/m   Physical Exam Vitals and nursing note reviewed.  Constitutional:      Appearance: She is well-developed. She is not diaphoretic.  HENT:     Head: Normocephalic and atraumatic.     Right Ear: External ear normal.     Left Ear: External ear normal.  Eyes:     General: No scleral icterus.       Right eye: No discharge.        Left eye: No discharge.     Conjunctiva/sclera: Conjunctivae normal.  Neck:     Trachea: No tracheal deviation.  Cardiovascular:     Rate and Rhythm: Normal rate and regular rhythm.  Pulmonary:     Effort: Pulmonary effort is normal. No respiratory distress.     Breath sounds: Normal breath sounds. No stridor. No wheezing or rales.  Abdominal:     General: Bowel sounds are normal. There is no distension.     Palpations: Abdomen is  soft.     Tenderness: There is no abdominal tenderness. There is no guarding or rebound.  Musculoskeletal:        General: Tenderness present. No deformity.     Cervical back: Neck supple.     Comments: Mild tenderness palpation lower thoracic upper lumbar region, paraspinal tenderness in the left lumbar region  Skin:    General: Skin is warm and dry.     Findings: No rash.  Neurological:     General: No focal deficit present.     Mental Status: She is alert.  Cranial Nerves: No cranial nerve deficit, dysarthria or facial asymmetry.     Sensory: No sensory deficit.     Motor: No abnormal muscle tone or seizure activity.     Coordination: Coordination normal.  Psychiatric:        Mood and Affect: Mood normal.     (all labs ordered are listed, but only abnormal results are displayed) Labs Reviewed  CBC - Abnormal; Notable for the following components:      Result Value   RDW 16.8 (*)    All other components within normal limits  BASIC METABOLIC PANEL WITH GFR - Abnormal; Notable for the following components:   Chloride 112 (*)    CO2 21 (*)    Glucose, Bld 123 (*)    BUN 42 (*)    Creatinine, Ser 2.02 (*)    GFR, Estimated 24 (*)    All other components within normal limits    EKG: EKG Interpretation Date/Time:  Saturday May 12 2024 15:46:14 EST Ventricular Rate:  63 PR Interval:  220 QRS Duration:  146 QT Interval:  437 QTC Calculation: 448 R Axis:   -68  Text Interpretation: atrial sensing ventricular paced rhythm LVH with IVCD, LAD and secondary repol abnrm Anterolateral infarct, age indeterminate No significant change since last tracing Confirmed by Randol Simmonds [phone]) on 05/12/2024 3:52:09 PM  Radiology: CT Renal Stone Study Result Date: 05/12/2024 CLINICAL DATA:  Abdominal and flank pain EXAM: CT ABDOMEN AND PELVIS WITHOUT CONTRAST TECHNIQUE: Multidetector CT imaging of the abdomen and pelvis was performed following the standard protocol without IV  contrast. RADIATION DOSE REDUCTION: This exam was performed according to the departmental dose-optimization program which includes automated exposure control, adjustment of the mA and/or kV according to patient size and/or use of iterative reconstruction technique. COMPARISON:  05/04/2021 FINDINGS: Lower chest: No acute pleural or parenchymal lung disease. Hepatobiliary: Unremarkable unenhanced appearance of the liver and gallbladder. Pancreas: Unremarkable unenhanced appearance. Spleen: Unremarkable unenhanced appearance. Adrenals/Urinary Tract: 2 mm nonobstructing calculus upper pole left kidney. No right-sided calculi. No obstructive uropathy within either kidney. Bilateral renal cortical cysts do not require specific imaging follow-up. The adrenals and bladder are unremarkable. Stomach/Bowel: No bowel obstruction or ileus. No bowel wall thickening or inflammatory change. Moderate stool throughout the colon. Vascular/Lymphatic: Aortic atherosclerosis. No enlarged abdominal or pelvic lymph nodes. Reproductive: Status post hysterectomy. No adnexal masses. Other: No free fluid or free intraperitoneal gas. Fat containing left inguinal hernia. No bowel herniation. Musculoskeletal: No acute or destructive bony abnormalities. Reconstructed images demonstrate no additional findings. IMPRESSION: 1. Nonobstructing 2 mm left renal calculus. 2. Otherwise no acute intra-abdominal or intrapelvic process. 3. Fat containing left inguinal hernia. 4.  Aortic Atherosclerosis (ICD10-I70.0). Electronically Signed   By: Ozell Daring M.D.   On: 05/12/2024 17:28   DG Lumbar Spine Complete Addendum Date: 05/12/2024 ******** ADDENDUM #1 ******** ADDENDUM: A port view of the lumbar spine and AP, lateral, and swimmer views of the thoracic spine are obtained. ---------------------------------------------------- Electronically signed by: Elsie Gravely MD 05/12/2024 04:34 PM EST RP Workstation: [id number]   Result Date:  05/12/2024 ******** ORIGINAL REPORT ******** EXAM: 4 VIEW(S) XRAY OF THE LUMBAR SPINE 05/12/2024 03:45:12 PM COMPARISON: Previous MRI lumbar spine 09/04/2010. CLINICAL HISTORY: back pain FINDINGS: LUMBAR SPINE: BONES: No acute fracture. No aggressive appearing osseous lesion. Mild lumbar scoliosis convex towards the right. Slight anterior subluxation at L4-L5 is likely degenerative changes in the facet joints. DISCS AND DEGENERATIVE CHANGES: Degenerative changes in the lumbar spine with disc space  narrowing and endplate osteophyte formation throughout. Slight anterior subluxation at L4-L5 is likely degenerative changes in the facet joints. SOFT TISSUES: No acute abnormality. THORACIC SPINE: Degenerative changes in the thoracic spine with disc space narrowing and endplate osteophyte formation throughout. INCIDENTAL FINDINGS: Incidental note of degenerative changes in the hips. IMPRESSION: 1. Degenerative changes in the thoracic and lumbar spine with disc space narrowing and endplate osteophyte formation throughout. 2. Slight anterior subluxation at L4-L5, likely degenerative changes in the facet joints. 3. Mild lumbar scoliosis convex towards the right. 4. No acute fracture or dislocation in the thoracic or lumbar spine . Electronically signed by: Elsie Gravely MD 05/12/2024 03:50 PM EST RP Workstation: [id number]   DG Thoracic Spine 2 View Addendum Date: 05/12/2024 ******** ADDENDUM #1 ******** ADDENDUM: A port view of the lumbar spine and AP, lateral, and swimmer views of the thoracic spine are obtained. ---------------------------------------------------- Electronically signed by: Elsie Gravely MD 05/12/2024 04:34 PM EST RP Workstation: [id number]   Result Date: 05/12/2024 ******** ORIGINAL REPORT ******** EXAM: 4 VIEW(S) XRAY OF THE LUMBAR SPINE 05/12/2024 03:45:12 PM COMPARISON: Previous MRI lumbar spine 09/04/2010. CLINICAL HISTORY: back pain FINDINGS: LUMBAR SPINE: BONES: No acute fracture. No  aggressive appearing osseous lesion. Mild lumbar scoliosis convex towards the right. Slight anterior subluxation at L4-L5 is likely degenerative changes in the facet joints. DISCS AND DEGENERATIVE CHANGES: Degenerative changes in the lumbar spine with disc space narrowing and endplate osteophyte formation throughout. Slight anterior subluxation at L4-L5 is likely degenerative changes in the facet joints. SOFT TISSUES: No acute abnormality. THORACIC SPINE: Degenerative changes in the thoracic spine with disc space narrowing and endplate osteophyte formation throughout. INCIDENTAL FINDINGS: Incidental note of degenerative changes in the hips. IMPRESSION: 1. Degenerative changes in the thoracic and lumbar spine with disc space narrowing and endplate osteophyte formation throughout. 2. Slight anterior subluxation at L4-L5, likely degenerative changes in the facet joints. 3. Mild lumbar scoliosis convex towards the right. 4. No acute fracture or dislocation in the thoracic or lumbar spine . Electronically signed by: Elsie Gravely MD 05/12/2024 03:50 PM EST RP Workstation: [id number]    {Document cardiac monitor, telemetry assessment procedure when appropriate:32947} Procedures   Medications Ordered in the ED  morphine  (PF) 4 MG/ML injection 4 mg (4 mg Intravenous Given 05/12/24 1602)  ondansetron  (ZOFRAN ) injection 4 mg (4 mg Intravenous Given 05/12/24 1601)  lactated ringers  bolus 500 mL (500 mLs Intravenous New Bag/Given 05/12/24 1653)    Clinical Course as of 05/12/24 1804  Sat May 12, 2024  1643 CBC(!) CBC normal [JK]  1644 Basic metabolic panel(!) Metabolic panel shows increased creatinine compared to previous [JK]  1644 X-ray showed degenerative changes in the thoracic and lumbar spine no acute fracture noted [JK]  1739 CT scan shows a nonobstructing left renal calculus.  Otherwise no acute abnormality [JK]    Clinical Course User Index [JK] Randol Simmonds, MD   {Click here for ABCD2, HEART  and other calculators REFRESH Note before signing:1}                              Medical Decision Making Problems Addressed: Acute left-sided back pain, unspecified back location: acute illness or injury that poses a threat to life or bodily functions  Amount and/or Complexity of Data Reviewed Labs: ordered. Decision-making details documented in ED Course. Radiology: ordered and independent interpretation performed.  Risk Prescription drug management.   Patient presented with complaints of back pain.  Patient's  ED workup does not show any signs of acute anemia.  No signs of infection.  Metabolic panel did show increase in her creatinine  {Document critical care time when appropriate  Document review of labs and clinical decision tools ie CHADS2VASC2, etc  Document your independent review of radiology images and any outside records  Document your discussion with family members, caretakers and with consultants  Document social determinants of health affecting pt's care  Document your decision making why or why not admission, treatments were needed:32947:::1}   Final diagnoses:  Acute left-sided back pain, unspecified back location    ED Discharge Orders          Ordered    oxyCODONE  (ROXICODONE ) 5 MG immediate release tablet  Every 4 hours PRN        05/12/24 1801    lidocaine  (LIDODERM ) 5 %  Every 24 hours        05/12/24 1801

## 2024-05-12 NOTE — Discharge Instructions (Signed)
 Take the medications to help with the pain discomfort.  Return to the ED for fever, vomiting or other concerning symptoms

## 2024-05-12 NOTE — ED Notes (Addendum)
 Care delay: Unable to obtain IV. Peers consulted and also unable to obtain IV. IV team ordered

## 2024-05-16 ENCOUNTER — Telehealth: Payer: Self-pay

## 2024-05-16 DIAGNOSIS — E1169 Type 2 diabetes mellitus with other specified complication: Secondary | ICD-10-CM

## 2024-05-16 DIAGNOSIS — Z79899 Other long term (current) drug therapy: Secondary | ICD-10-CM

## 2024-05-16 NOTE — Telephone Encounter (Signed)
-----   Message from Nurse Geni E sent at 01/27/2024  1:04 PM EDT ----- Regarding: FLP/CMET Requested flp/cmet from pcp 8/8

## 2024-05-16 NOTE — Telephone Encounter (Signed)
 Call to patient, spoke with patient and son Derrek (DPR). Derrek states he does not know when patient's last labs were but he can take her to get FLP/ALT next week. Lab orders placed and released. Derrek verbalizes understanding to fast for labs and of location of [facility].

## 2024-05-24 ENCOUNTER — Ambulatory Visit: Payer: Self-pay | Admitting: Cardiology

## 2024-05-24 LAB — LIPID PANEL
Chol/HDL Ratio: 2.8 ratio (ref 0.0–4.4)
Cholesterol, Total: 125 mg/dL (ref 100–199)
HDL: 45 mg/dL (ref >39)
LDL Chol Calc (NIH): 62 mg/dL (ref 0–99)
Triglycerides: 97 mg/dL (ref 0–149)
VLDL Cholesterol Cal: 18 mg/dL (ref 5–40)

## 2024-05-24 LAB — ALT: ALT: 13 IU/L (ref 0–32)

## 2024-05-24 NOTE — Telephone Encounter (Signed)
 Call to patient to advise lipids at goal, no answer. No updated DPR on file for our practice. Left VM asking recipient to call [place] at our office #.

## 2024-05-24 NOTE — Telephone Encounter (Signed)
-----   Message from Wilbert Bihari sent at 05/24/2024  9:57 AM EST ----- Lipids at goal continue current therapy and forward to PCP ----- Message ----- From: Interface, [facility] Lab Results In Sent: 05/24/2024   1:36 AM EST To: Wilbert JONELLE Bihari, MD

## 2024-05-29 NOTE — Telephone Encounter (Signed)
-----   Message from Wilbert Bihari sent at 05/24/2024  9:57 AM EST ----- Lipids at goal continue current therapy and forward to PCP ----- Message ----- From: Interface, [facility] Lab Results In Sent: 05/24/2024   1:36 AM EST To: Wilbert JONELLE Bihari, MD

## 2024-05-29 NOTE — Telephone Encounter (Signed)
 Call to patient to advise lipids at goal and to continue current therapy. Patient verbalizes understanding. Labs forwarded to PCP.

## 2024-07-12 ENCOUNTER — Ambulatory Visit: Admitting: Podiatry

## 2024-07-25 ENCOUNTER — Ambulatory Visit: Admitting: Podiatry

## 2024-07-25 DIAGNOSIS — B351 Tinea unguium: Secondary | ICD-10-CM

## 2024-07-25 NOTE — Progress Notes (Signed)
 Patient presents for evaluation and treatment of tenderness and some redness around nails feet.  Tenderness around toes with walking and wearing shoes.  Physical exam:  General appearance: Alert, pleasant, and in no acute distress.  Vascular: Pedal pulses: DP 1/4 B/L, PT 0/4 B/L. Mild edema lower legs bilaterally.  Capillary refill time immediate bilaterally  Neurologic:  Dermatologic:  Nails thickened, disfigured, discolored 1-5 BL with subungual debris.  Redness and hypertrophic nail folds along nail folds bilaterally but no signs of drainage or infection.  Musculoskeletal:     Diagnosis: 1. Painful onychomycotic nails 1 through 5 bilaterally. 2. Pain toes 1 through 5 bilaterally.  Plan: -Debrided onychomycotic nails 1 through 5 bilaterally.  Sharply debrided nails with nail clipper and reduced with a power bur.  Return 3 months [facility]

## 2024-08-03 ENCOUNTER — Encounter

## 2024-10-22 ENCOUNTER — Ambulatory Visit: Admitting: Podiatry

## 2024-11-02 ENCOUNTER — Encounter

## 2025-02-01 ENCOUNTER — Encounter

## 2025-05-03 ENCOUNTER — Encounter

## 2025-08-02 ENCOUNTER — Encounter

## 2025-11-01 ENCOUNTER — Encounter
